# Patient Record
Sex: Female | Born: 1935 | Race: White | Hispanic: No | Marital: Married | Smoking: Former smoker
Health system: Southern US, Community
[De-identification: ages and names within clinical notes are randomized; demographics above are authoritative.]

## PROBLEM LIST (undated history)

## (undated) DIAGNOSIS — G4730 Sleep apnea, unspecified: Secondary | ICD-10-CM

## (undated) DIAGNOSIS — E785 Hyperlipidemia, unspecified: Secondary | ICD-10-CM

## (undated) DIAGNOSIS — I482 Chronic atrial fibrillation, unspecified: Secondary | ICD-10-CM

## (undated) DIAGNOSIS — M7120 Synovial cyst of popliteal space [Baker], unspecified knee: Secondary | ICD-10-CM

## (undated) DIAGNOSIS — M1990 Unspecified osteoarthritis, unspecified site: Secondary | ICD-10-CM

## (undated) DIAGNOSIS — I2510 Atherosclerotic heart disease of native coronary artery without angina pectoris: Secondary | ICD-10-CM

## (undated) DIAGNOSIS — G459 Transient cerebral ischemic attack, unspecified: Secondary | ICD-10-CM

## (undated) DIAGNOSIS — I219 Acute myocardial infarction, unspecified: Secondary | ICD-10-CM

## (undated) DIAGNOSIS — I5022 Chronic systolic (congestive) heart failure: Secondary | ICD-10-CM

## (undated) DIAGNOSIS — I341 Nonrheumatic mitral (valve) prolapse: Secondary | ICD-10-CM

## (undated) HISTORY — DX: Nonrheumatic mitral (valve) prolapse: I34.1

## (undated) HISTORY — PX: CORONARY ANGIOPLASTY: SHX604

## (undated) HISTORY — DX: Hyperlipidemia, unspecified: E78.5

## (undated) HISTORY — PX: OTHER SURGICAL HISTORY: SHX169

## (undated) HISTORY — DX: Atherosclerotic heart disease of native coronary artery without angina pectoris: I25.10

## (undated) HISTORY — PX: TONSILLECTOMY: SUR1361

---

## 1999-04-07 ENCOUNTER — Encounter: Admission: RE | Admit: 1999-04-07 | Discharge: 1999-04-07 | Payer: Self-pay | Admitting: Internal Medicine

## 1999-05-04 ENCOUNTER — Encounter: Admission: RE | Admit: 1999-05-04 | Discharge: 1999-05-04 | Payer: Self-pay | Admitting: Hematology and Oncology

## 1999-09-13 ENCOUNTER — Encounter: Admission: RE | Admit: 1999-09-13 | Discharge: 1999-09-13 | Payer: Self-pay | Admitting: Hematology and Oncology

## 1999-11-09 ENCOUNTER — Encounter: Admission: RE | Admit: 1999-11-09 | Discharge: 1999-11-09 | Payer: Self-pay | Admitting: Internal Medicine

## 1999-11-24 ENCOUNTER — Encounter: Admission: RE | Admit: 1999-11-24 | Discharge: 1999-11-24 | Payer: Self-pay | Admitting: Hematology and Oncology

## 2000-04-11 ENCOUNTER — Encounter: Admission: RE | Admit: 2000-04-11 | Discharge: 2000-04-11 | Payer: Self-pay | Admitting: Internal Medicine

## 2000-04-19 ENCOUNTER — Ambulatory Visit (HOSPITAL_COMMUNITY): Admission: RE | Admit: 2000-04-19 | Discharge: 2000-04-19 | Payer: Self-pay

## 2000-04-19 ENCOUNTER — Encounter: Payer: Self-pay | Admitting: Internal Medicine

## 2002-01-20 ENCOUNTER — Encounter: Admission: RE | Admit: 2002-01-20 | Discharge: 2002-01-20 | Payer: Self-pay | Admitting: Internal Medicine

## 2002-02-03 ENCOUNTER — Encounter: Admission: RE | Admit: 2002-02-03 | Discharge: 2002-02-03 | Payer: Self-pay | Admitting: Internal Medicine

## 2002-02-03 ENCOUNTER — Other Ambulatory Visit: Admission: RE | Admit: 2002-02-03 | Discharge: 2002-02-03 | Payer: Self-pay | Admitting: Family Medicine

## 2003-10-12 ENCOUNTER — Encounter: Admission: RE | Admit: 2003-10-12 | Discharge: 2003-10-12 | Payer: Self-pay | Admitting: Internal Medicine

## 2003-10-15 ENCOUNTER — Ambulatory Visit (HOSPITAL_COMMUNITY): Admission: RE | Admit: 2003-10-15 | Discharge: 2003-10-15 | Payer: Self-pay | Admitting: Internal Medicine

## 2003-10-26 ENCOUNTER — Encounter: Admission: RE | Admit: 2003-10-26 | Discharge: 2003-10-26 | Payer: Self-pay | Admitting: Internal Medicine

## 2003-11-11 ENCOUNTER — Encounter: Admission: RE | Admit: 2003-11-11 | Discharge: 2003-11-11 | Payer: Self-pay | Admitting: Internal Medicine

## 2003-11-18 ENCOUNTER — Encounter: Admission: RE | Admit: 2003-11-18 | Discharge: 2003-11-18 | Payer: Self-pay | Admitting: Internal Medicine

## 2004-08-28 ENCOUNTER — Emergency Department (HOSPITAL_COMMUNITY): Admission: EM | Admit: 2004-08-28 | Discharge: 2004-08-28 | Payer: Self-pay | Admitting: Family Medicine

## 2004-08-30 ENCOUNTER — Emergency Department (HOSPITAL_COMMUNITY): Admission: EM | Admit: 2004-08-30 | Discharge: 2004-08-30 | Payer: Self-pay | Admitting: Family Medicine

## 2004-09-01 ENCOUNTER — Emergency Department (HOSPITAL_COMMUNITY): Admission: EM | Admit: 2004-09-01 | Discharge: 2004-09-01 | Payer: Self-pay | Admitting: Family Medicine

## 2005-04-16 ENCOUNTER — Emergency Department (HOSPITAL_COMMUNITY): Admission: EM | Admit: 2005-04-16 | Discharge: 2005-04-16 | Payer: Self-pay | Admitting: Emergency Medicine

## 2005-04-27 ENCOUNTER — Emergency Department (HOSPITAL_COMMUNITY): Admission: EM | Admit: 2005-04-27 | Discharge: 2005-04-27 | Payer: Self-pay | Admitting: Emergency Medicine

## 2005-05-18 ENCOUNTER — Ambulatory Visit: Payer: Self-pay | Admitting: Internal Medicine

## 2006-05-10 ENCOUNTER — Encounter: Payer: Self-pay | Admitting: Emergency Medicine

## 2006-05-11 ENCOUNTER — Inpatient Hospital Stay (HOSPITAL_COMMUNITY): Admission: EM | Admit: 2006-05-11 | Discharge: 2006-05-14 | Payer: Self-pay | Admitting: Emergency Medicine

## 2006-06-06 ENCOUNTER — Encounter (HOSPITAL_COMMUNITY): Admission: RE | Admit: 2006-06-06 | Discharge: 2006-09-04 | Payer: Self-pay | Admitting: Cardiovascular Disease

## 2006-09-05 ENCOUNTER — Encounter (HOSPITAL_COMMUNITY): Admission: RE | Admit: 2006-09-05 | Discharge: 2006-09-27 | Payer: Self-pay | Admitting: Cardiovascular Disease

## 2007-09-22 ENCOUNTER — Emergency Department (HOSPITAL_COMMUNITY): Admission: EM | Admit: 2007-09-22 | Discharge: 2007-09-22 | Payer: Self-pay | Admitting: Emergency Medicine

## 2007-09-22 ENCOUNTER — Ambulatory Visit: Payer: Self-pay | Admitting: Vascular Surgery

## 2007-09-22 ENCOUNTER — Encounter (INDEPENDENT_AMBULATORY_CARE_PROVIDER_SITE_OTHER): Payer: Self-pay | Admitting: Emergency Medicine

## 2008-05-17 LAB — LIPID PANEL: LDL Cholesterol: 43 mg/dL

## 2008-07-26 ENCOUNTER — Encounter: Admission: RE | Admit: 2008-07-26 | Discharge: 2008-07-26 | Payer: Self-pay | Admitting: Internal Medicine

## 2010-03-08 ENCOUNTER — Ambulatory Visit: Payer: Self-pay | Admitting: Cardiovascular Disease

## 2010-07-28 ENCOUNTER — Encounter: Payer: Self-pay | Admitting: Cardiovascular Disease

## 2010-07-28 DIAGNOSIS — E119 Type 2 diabetes mellitus without complications: Secondary | ICD-10-CM | POA: Insufficient documentation

## 2010-07-28 DIAGNOSIS — E785 Hyperlipidemia, unspecified: Secondary | ICD-10-CM | POA: Insufficient documentation

## 2010-07-31 ENCOUNTER — Encounter: Payer: Self-pay | Admitting: Cardiovascular Disease

## 2010-07-31 DIAGNOSIS — I341 Nonrheumatic mitral (valve) prolapse: Secondary | ICD-10-CM | POA: Insufficient documentation

## 2010-09-15 ENCOUNTER — Ambulatory Visit: Payer: Self-pay | Admitting: Cardiovascular Disease

## 2010-09-21 ENCOUNTER — Ambulatory Visit: Payer: Self-pay | Admitting: Cardiovascular Disease

## 2010-10-30 ENCOUNTER — Encounter: Payer: Self-pay | Admitting: Cardiovascular Disease

## 2010-11-07 ENCOUNTER — Telehealth: Payer: Self-pay | Admitting: *Deleted

## 2010-11-07 NOTE — Telephone Encounter (Signed)
Letter sent for missed visit from 09/21/10. Pt cancelled and rescheduled then no showed.Corwin Levins RN

## 2010-11-17 NOTE — Cardiovascular Report (Signed)
NAMEMARDI, EVAVOLD NO.:  [id number]   MEDICAL RECORD NO.:  [id number]          PATIENT TYPE:  INP   LOCATION:  6532                         FACILITY:  [facility]   PHYSICIAN:  Thayer Headings, M.D. DATE OF BIRTH:  1932-06-24   DATE OF PROCEDURE:  05/13/2006  DATE OF DISCHARGE:                              CARDIAC CATHETERIZATION   PROCEDURE:  PTCA and stent.   Shannon [name] is a 77 year old female with a history of atypical chest  pain.  She has had a negative stress Cardiolite study approximately three  years ago.  She presented to the hospital this week complaining of severe  chest pain.  She ruled in for myocardial infarction.  She was referred for  heart catheterization for further evaluation.   PROCEDURE IN DETAIL:  Left catheterization with coronary angiography.   The right femoral artery was easily cannulated using a modified Seldinger  technique.   Hemodynamics:  LV pressure is 104/12 with an aortic pressure of 105/62.   Angiography of left main:  Left main is fairly smooth and normal.   The left anterior descending artery has mild irregularities proximal.   The mid segment has a 40% stenosis right at the origin of the first diagonal  artery.  This lesion also extends into the first diagonal artery creating a  40% stenosis there as well.  The remainder of the mid and distal LAD have  only minor irregularities.  The first diagonal artery is a moderate-sized  vessel with minor plaque.   The left circumflex artery has minor irregularities.  It gives off a large  obtuse marginal branch and then continues around in the AV groove.  The  continuation branch of the circumflex vessel is fairly normal.  It is  codominant.  It gives off a very small posterior descending artery with no  stenoses.   The first obtuse marginal artery is a fairly large branch.  It has an  ulcerated plaque with about a 95% stenosis.  There was TIMI grade 2 flow  down that  first obtuse marginal artery.   The right coronary artery is small and is codominant.  It gives off a very  small posterior descending artery.  One of the right ventricular marginal  branches also continues around and supplies a small amount of the posterior  descending artery distribution.   The left ventriculogram was performed in a 30 RAO position.  It reveals  lateral akinesis.  The ejection fraction is approximately 40%.  There is a  trace amount of mitral regurgitation.  There was no aortic stenosis on  pressure monitoring.   PCI:  A 6-French Judkins left 4 guide was positioned in the left main and an  Asahi soft wire was positioned down the first obtuse marginal artery without  any problems.  A 3.0 x 15 mm Quantum Monorail was positioned across the OM  stenosis.  It was inflated up to 10 atmospheres for 32 seconds.  This was  followed by a 3.0 x 20 mm Taxus stent.  It was deployed at 12 atmospheres  for 24 seconds.  This resulted in a  significantly improved lumen.   Post stent dilatation was achieved using a 3.25 x 15 mm Quantum Monorail.  It was positioned in the distal aspect of the stent and was inflated up to  12 atmospheres for 12 seconds.  It was then positioned in the middle of the  stent and was inflated up to 14 atmospheres for 15 seconds.  It was then  pulled back to the proximal edge and the stent was inflated up to 12  atmospheres for 14 seconds.   This resulted in a very nice lumen.  There was TIMI grade 3 flow down the  stent.   COMPLICATIONS:  None.   CONCLUSIONS:  Successful PTC and stenting of the first obtuse marginal  artery.   She has moderate disease in her left anterior descending artery.  Will  continue medical therapy.  We have given her 300 mg of Plavix in the cath  lab.      [name], P.A.    ______________________________  Thayer Headings, M.D.    RJD/MEDQ  D:  05/13/2006  T:  05/14/2006  Job:  [id number]   cc:   Haywood Pao, M.D.

## 2010-11-17 NOTE — Discharge Summary (Signed)
[name], [name] NO.:  [id number]   MEDICAL RECORD NO.:  [id number]          PATIENT TYPE:  INP   LOCATION:  6532                         FACILITY:  [facility]   PHYSICIAN:  Thayer Headings, M.D. DATE OF BIRTH:  11-12-1931   DATE OF ADMISSION:  05/14/2006  DATE OF DISCHARGE:  05/14/2006                                 DISCHARGE SUMMARY   DISCHARGE DIAGNOSES:  1. Acute lateral wall myocardial infarction.  2. Status post percutaneous coronary intervention and stenting of the      first obtuse marginal artery.  3. Hyperlipidemia.  4. Glucose intolerance.  5. History of palpitations.   DISCHARGE MEDICATIONS:  1. Crestor 10 mg a day.  2. Plavix 75 mg a day.  3. Aspirin 81 mg a day.  4. Protonix or Prilosec as needed.  5. Toprol-XL 50 mg a day.  6. Nitroglycerin 0.4 mg sublingually as needed.   DISPOSITION:  The patient will see Dr. Acie Fredrickson in 1-2 weeks for an EKG.  She  will see Dr. Osborne Casco as needed.   HISTORY:  Mrs. Shannon [name] is a 77 year old female with a history of chest pains  in the past.  She was admitted for acute chest pain and EKG changes  consistent with a lateral wall myocardial infarction.  Please see dictated  H&P for further details.   HOSPITAL COURSE:  1. Coronary artery disease.  The patient was admitted with lateral T-wave      inversions consistent with a subendocardial myocardial infarction .      She was placed on heparin and Integrilin and became pain-free.  She      ruled-in for myocardial infarction with CK-MBs and troponin levels.      Her troponin level was 12.9 at its max.   She underwent heart catheterization which revealed a subtotal first obtuse  marginal artery.  She had mild irregularities in the other vessels.  She  underwent successful PTCA and stenting of the circumflex marginal vessel  using a 3.0 x 20 mm Taxus-Stent deployed at 12 atmospheric.  We post dilated  the stent using a 3.25 mm balloon inflated up to 14  atmospheres.  She  tolerated the procedure quite well and did not have any further episodes of  chest pain.  She did not have any bleeding.   She has now been able to ambulate and is ready for discharge.  She will  return to see Dr. Acie Fredrickson in 1-2 weeks for a followup visit.   1. Hyperlipidemia.  The patient had a fasting lipid profile.  Her LDL was      found to be 132.  Her HDL was found to be 49, triglycerides  59, and      total cholesterol was 193.  We will start her on Crestor 10 mg a day.      All of her other medical problems are stable.           ______________________________  Thayer Headings, M.D.     PJN/MEDQ  D:  05/14/2006  T:  05/14/2006  Job:  [id number]   cc:   Fransico Him.  Tisovec, M.D.

## 2010-11-17 NOTE — H&P (Signed)
NAMETU, FERENCE NO.:  [id number]   MEDICAL RECORD NO.:  [id number]          PATIENT TYPE:  INP   LOCATION:  2311                         FACILITY:  [facility]   PHYSICIAN:  Darlin Coco, M.D. DATE OF BIRTH:  19-Jan-1932   DATE OF ADMISSION:  05/11/2006  DATE OF DISCHARGE:                                HISTORY & PHYSICAL   CHIEF COMPLAINT:  Chest pain.   HISTORY:  This is a 77 year old married, Caucasian female who lives in  [place] and who was in [place] last evening to drive her granddaughter  to a football game.  When she picked the granddaughter up at the end of the  game and was driving her home, she began experiencing severe substernal  chest pain, radiating through to the back, associated with nausea and  vomiting x2.  She felt like she was having heart attack.  She instructed the  family to call 9-1-1 and was taken to [facility].  In the emergency room at [facility], her EKG showed some transient  ST-segment depression in the anterior leads.  Her first two sets of enzymes  were negative, and third point-of-care set was mildly positive.  She was  started on aspirin, IV nitroglycerin, and IV heparin in the [facility] emergency room and transferred.  Of note, is the fact that she does  not have any known coronary disease.  She had been told years ago that she  had a problem with her mitral valve.  She has seen Dr. Acie Fredrickson.  Apparently,  her echocardiogram did not show any serious problem with the mitral valve  and she states that she also had a normal treadmill Cardiolite approximately  3 years ago at Dr. Elmarie Shiley office.   Her only regular medicine is a generic Toprol XL 50 mg which she takes for  prevention of palpitations.   She sees Dr. Osborne Casco for internal medicine care.   She has been told that she is pre-diabetic and on her own she has lost 19  pounds intentionally since July, in an effort to prevent  full-blown  diabetes.   ALLERGIES:  SHE IS ALLERGIC TO:  1. DEMEROL.  2. PENICILLIN.  3. POSSIBLY CODEINE.   FAMILY HISTORY:  Reveals that her mother died at 34 of an acute MI which led  to congestive heart failure.  Her father died at 42 of sudden cardiac death  and acute indigestion.  She has a 7 year old sister who is living and  well and does not see doctors often.  And, she has an 2 year old sister who  has had multiple operations including coronary artery bypass graft surgery  and is living.   SOCIAL HISTORY:  Reveals that she has been married twice.  She had four  children by her first marriage and one by her second marriage.  She has been  married to her present husband 4 years.  She smoked briefly in the 1960s,  but not since.  She does not drink alcohol.  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   SURGERY:  Tonsillectomy.   REVIEW OF SYSTEMS:  She has had a remote history of ulcers which never bled  42 years ago.  GENITOURINARY:  She has some mild stress incontinence when  she coughs or sneezes.  She does not have any dysuria.  She denies cough or  sputum production.  Remainder of review of systems is negative in detail.   PHYSICAL EXAMINATION:  VITAL SIGNS:  Blood pressure is 125/75, pulse 66  regular, respirations are normal.  GENERAL:  Color, she is very pale.  She is not diaphoretic.  Despite the  pallor, her hemoglobin at [facility] was normal.  NECK:  Jugular venous pressure is normal.  The carotids are normal without  bruits.  CHEST:  Clear.  HEART:  Reveals a quiet precordium without murmur, gallop, rub or click.  ABDOMEN:  Soft without hepatosplenomegaly or masses.  Bowel sounds are  present.  EXTREMITIES:  Show no phlebitis or edema and she has excellent pulses.   Her chest x-ray shows normal heart size and no active disease.   Her  electrocardiogram showed normal sinus rhythm, left axis deviation, and on  the EKG on arrival here she does not have any ischemic ST-T wave changes.   IMPRESSION:  1. Chest pain, presently relieved, suggestive of acute coronary syndrome      with mildly elevated enzymes.  2. History of pre-diabetes.  3. History of hypercholesterolemia.   DISPOSITION:  1. We are going to admit to the CCU or step-down.  2. We will continue IV nitroglycerin and IV heparin.  3. We will add Integrilin.  4. We will add Lipitor and continue beta blocker and aspirin.  5. Anticipate probable cardiac catheterization Monday by Dr. Acie Fredrickson or      sooner if she has recurrent pain.           ______________________________  Darlin Coco, M.D.     TB/MEDQ  D:  05/11/2006  T:  05/11/2006  Job:  [id number]   cc:   Thayer Headings, M.D.  Haywood Pao, M.D.

## 2010-12-07 ENCOUNTER — Ambulatory Visit (INDEPENDENT_AMBULATORY_CARE_PROVIDER_SITE_OTHER): Payer: Medicare PPO | Admitting: Cardiovascular Disease

## 2010-12-07 ENCOUNTER — Encounter: Payer: Self-pay | Admitting: Cardiovascular Disease

## 2010-12-07 VITALS — BP 106/62 | HR 74 | Ht 63.0 in | Wt 158.8 lb

## 2010-12-07 DIAGNOSIS — I2510 Atherosclerotic heart disease of native coronary artery without angina pectoris: Secondary | ICD-10-CM

## 2010-12-07 NOTE — Progress Notes (Signed)
Shannon [name] Date of Birth  12-13-1931 [facility] [id number] [place], [place]  [address] [phone]  Fax  [phone]  History of Present Illness:  No angina.  Stays busy throughout the day.  She has occasional episodes of very atypical chest pain but nothing that feels like her standard episodes of angina.  Current Outpatient Prescriptions on File Prior to Visit  Medication Sig Dispense Refill  . aspirin 81 MG tablet Take 81 mg by mouth daily.        . fish oil-omega-3 fatty acids 1000 MG capsule Take 2 g by mouth daily.        [name] HYDROCODONE-ACETAMINOPHEN PO Take by mouth as needed.        . metFORMIN (GLUMETZA) 500 MG (MOD) 24 hr tablet Take 500 mg by mouth daily.        . metoprolol (TOPROL-XL) 50 MG 24 hr tablet Take 50 mg by mouth daily.        . nitroGLYCERIN (NITROSTAT) 0.4 MG SL tablet Place 0.4 mg under the tongue every 5 (five) minutes as needed.        [name] VITAMIN D, CHOLECALCIFEROL, PO Take 1,600 Units by mouth.        . DISCONTD: calcium-vitamin D 185 MG TABS Take 185 mg by mouth daily.        [name] DISCONTD: clopidogrel (PLAVIX) 75 MG tablet Take 75 mg by mouth daily.        [name] DISCONTD: magnesium gluconate (MAGONATE) 500 MG tablet Take 500 mg by mouth 2 (two) times daily.        [name] DISCONTD: Zinc 10 MG TABS Take by mouth.          Allergies  Allergen Reactions  . Demerol   . Penicillins     Past Medical History  Diagnosis Date  . CAD (coronary artery disease)   . Hyperlipidemia   . Diabetes mellitus   . MVP (mitral valve prolapse)     Past Surgical History  Procedure Date  . Tonsillectomy   . Coronary angioplasty     History  Smoking status  . Former Smoker -- 1.0 packs/day  . Types: Cigarettes  Smokeless tobacco  . Not on file    History  Alcohol Use No    Family History  Problem Relation Age of Onset  . Heart attack Father   . Heart attack Mother   . Heart attack Brother   .  Coronary artery disease Sister     CABG  . Alzheimer's disease Sister     X3    Reviw of Systems:  Reviewed in the HPI.  All other systems are negative.  Physical Exam: BP 106/62  Pulse 74  Ht 5\' 3"  (1.6 m)  Wt 158 lb 12.8 oz (72.031 kg)  BMI 28.13 kg/m2 The patient is alert and oriented x 3.  The mood and affect are normal.  The skin is warm and dry.  Color is normal.  The HEENT exam reveals that the sclera are nonicteric.  The mucous membranes are moist.  The carotids are 2+ without bruits.  There is no thyromegaly.  There is no JVD.  The lungs are clear.  The chest wall is non tender.  The heart exam reveals a regular rate with a normal S1 and S2.  There are no murmurs, gallops, or rubs.  The PMI is not displaced.   Abdominal exam reveals good bowel  sounds.  There is no guarding or rebound.  There is no hepatosplenomegaly or tenderness.  There are no masses.  Exam of the legs reveal no clubbing, cyanosis, or edema.  The legs are without rashes.  The distal pulses are intact.  Cranial nerves II - XII are intact.  Motor and sensory functions are intact.  The gait is normal.  Assessment / Plan:

## 2010-12-07 NOTE — Assessment & Plan Note (Signed)
She's doing very well from a cardiac standpoint. She's not having any episodes of angina. She stopped her Plavix in December because of cost.   She'll continue with her current medications. I'll see her again in 6 months.

## 2011-02-14 ENCOUNTER — Emergency Department (HOSPITAL_COMMUNITY)
Admission: EM | Admit: 2011-02-14 | Discharge: 2011-02-15 | Disposition: A | Discharge status: Home / Self Care | Payer: Medicare PPO | Attending: Emergency Medicine | Admitting: Emergency Medicine

## 2011-02-14 DIAGNOSIS — E119 Type 2 diabetes mellitus without complications: Secondary | ICD-10-CM | POA: Insufficient documentation

## 2011-02-14 DIAGNOSIS — Y92009 Unspecified place in unspecified non-institutional (private) residence as the place of occurrence of the external cause: Secondary | ICD-10-CM | POA: Insufficient documentation

## 2011-02-14 DIAGNOSIS — S0083XA Contusion of other part of head, initial encounter: Secondary | ICD-10-CM | POA: Insufficient documentation

## 2011-02-14 DIAGNOSIS — I252 Old myocardial infarction: Secondary | ICD-10-CM | POA: Insufficient documentation

## 2011-02-14 DIAGNOSIS — Z7982 Long term (current) use of aspirin: Secondary | ICD-10-CM | POA: Insufficient documentation

## 2011-02-14 DIAGNOSIS — Z8673 Personal history of transient ischemic attack (TIA), and cerebral infarction without residual deficits: Secondary | ICD-10-CM | POA: Insufficient documentation

## 2011-02-14 DIAGNOSIS — S0003XA Contusion of scalp, initial encounter: Secondary | ICD-10-CM | POA: Insufficient documentation

## 2011-02-14 DIAGNOSIS — R51 Headache: Secondary | ICD-10-CM | POA: Insufficient documentation

## 2011-02-14 DIAGNOSIS — Z79899 Other long term (current) drug therapy: Secondary | ICD-10-CM | POA: Insufficient documentation

## 2011-02-14 DIAGNOSIS — W1809XA Striking against other object with subsequent fall, initial encounter: Secondary | ICD-10-CM | POA: Insufficient documentation

## 2011-02-14 DIAGNOSIS — S0990XA Unspecified injury of head, initial encounter: Secondary | ICD-10-CM | POA: Insufficient documentation

## 2011-02-15 ENCOUNTER — Emergency Department (HOSPITAL_COMMUNITY): Payer: Medicare PPO

## 2011-02-15 LAB — GLUCOSE, CAPILLARY: Glucose-Capillary: 167 mg/dL — ABNORMAL HIGH (ref 70–99)

## 2011-03-26 LAB — URINALYSIS, ROUTINE W REFLEX MICROSCOPIC
Glucose, UA: NEGATIVE
Hgb urine dipstick: NEGATIVE
Protein, ur: NEGATIVE
Specific Gravity, Urine: 1.014
pH: 5

## 2011-03-26 LAB — POCT I-STAT, CHEM 8
BUN: 16
Calcium, Ion: 1.15
Hemoglobin: 12.6
Sodium: 141
TCO2: 22

## 2011-03-26 LAB — CK: Total CK: 132

## 2011-03-26 LAB — URINE MICROSCOPIC-ADD ON

## 2011-04-14 ENCOUNTER — Other Ambulatory Visit: Payer: Self-pay | Admitting: Cardiovascular Disease

## 2011-04-16 ENCOUNTER — Other Ambulatory Visit: Payer: Self-pay | Admitting: *Deleted

## 2011-04-16 MED ORDER — METOPROLOL SUCCINATE ER 50 MG PO TB24: 50.0000 mg | ORAL_TABLET | Freq: Every day | ORAL | Status: DC

## 2011-04-16 NOTE — Telephone Encounter (Signed)
Fax Received. Refill Completed. Landree Fernholz Chowoe (M.A)  

## 2011-06-05 ENCOUNTER — Encounter: Payer: Self-pay | Admitting: Cardiovascular Disease

## 2011-06-05 ENCOUNTER — Ambulatory Visit (INDEPENDENT_AMBULATORY_CARE_PROVIDER_SITE_OTHER): Payer: Medicare PPO | Admitting: Cardiovascular Disease

## 2011-06-05 VITALS — BP 123/70 | HR 70 | Ht 63.0 in | Wt 155.8 lb

## 2011-06-05 DIAGNOSIS — I2510 Atherosclerotic heart disease of native coronary artery without angina pectoris: Secondary | ICD-10-CM

## 2011-06-05 MED ORDER — NITROGLYCERIN 0.4 MG SL SUBL: 0.4000 mg | SUBLINGUAL_TABLET | SUBLINGUAL | Status: DC | PRN

## 2011-06-05 NOTE — Patient Instructions (Signed)
Your physician wants you to follow-up in: 6 months You will receive a reminder letter in the mail two months in advance. If you don't receive a letter, please call our office to schedule the follow-up appointment  Your physician recommends that you return for a FASTING lipid profile: 6 months

## 2011-06-05 NOTE — Assessment & Plan Note (Signed)
Shannon [name] is doing well from a cardiac standpoint.  She has some occasional episodes of chest pain early in the AM but this is also associated with a headache.  She has been evaluated for sleep apnea.  She doesn't have angina with exercise.  I've asked her to call for any worsening of her CP.

## 2011-06-05 NOTE — Progress Notes (Signed)
    Shannon [name] Date of Birth  [birth year] [facility] [id number]    [place], [place]  [address] [phone]  Fax  [phone]  History of Present Illness:  Shannon [name] is an 77 y.o. female with a hx of CAD.  She has some occasional episodes of chest pressure typically when she wakes up in the morning. She also wakes up with a severe headache. She's been worked up for sleep apnea.  She works out in the yard on a fairly regular basis and does not have any chest pain or chest tightness with exercise the  Current Outpatient Prescriptions on File Prior to Visit  Medication Sig Dispense Refill  . aspirin 81 MG tablet Take 81 mg by mouth daily.        . fish oil-omega-3 fatty acids 1000 MG capsule Take 2 g by mouth daily.        [name] HYDROCODONE-ACETAMINOPHEN PO Take by mouth as needed.        . metFORMIN (GLUMETZA) 500 MG (MOD) 24 hr tablet Take 500 mg by mouth daily.        . metoprolol (TOPROL-XL) 50 MG 24 hr tablet Take 1 tablet (50 mg total) by mouth daily.  90 tablet  1  . nitroGLYCERIN (NITROSTAT) 0.4 MG SL tablet Place 0.4 mg under the tongue every 5 (five) minutes as needed.        [name] VITAMIN D, CHOLECALCIFEROL, PO Take 1,600 Units by mouth.          Allergies  Allergen Reactions  . Demerol   . Penicillins     Past Medical History  Diagnosis Date  . CAD (coronary artery disease)   . Hyperlipidemia   . Diabetes mellitus   . MVP (mitral valve prolapse)     Past Surgical History  Procedure Date  . Tonsillectomy   . Coronary angioplasty     History  Smoking status  . Former Smoker -- 1.0 packs/day  . Types: Cigarettes  Smokeless tobacco  . Not on file    History  Alcohol Use No    Family History  Problem Relation Age of Onset  . Heart attack Father   . Heart attack Mother   . Heart attack Brother   . Coronary artery disease Sister     CABG  . Alzheimer's disease Sister     X3    Reviw of Systems:  Reviewed in the HPI.  All other  systems are negative.  Physical Exam: BP 123/70  Pulse 70  Ht 5\' 3"  (1.6 m)  Wt 155 lb 12.8 oz (70.67 kg)  BMI 27.60 kg/m2 The patient is alert and oriented x 3.  The mood and affect are normal.   Skin: warm and dry.  Color is normal.    HEENT:   Her carotids are normal. The neck is supple. Her mucous membranes are moist.  Lungs: Lungs are clear.   Heart: Regular rate S1-S2.    Abdomen: Her abdomen is benign features good bowel sounds.  Extremities:  No clubbing cyanosis or edema  Neuro:  Exam is nonfocal. Gait is normal    ECG: Normal sinus rhythm. Normal EKG.  Assessment / Plan:

## 2011-07-12 ENCOUNTER — Telehealth: Payer: Self-pay | Admitting: Cardiovascular Disease

## 2011-07-12 NOTE — Telephone Encounter (Signed)
Pt took one nitro at 230 am this morning, was told to call if had to take and nitro to call and let us know, feels better now, chest pain gone, just feels washed out

## 2011-07-12 NOTE — Telephone Encounter (Signed)
Pt just calling fyi to let Dr Acie Fredrickson know that she took one nitro tab last night.  The pain was relieved by the nitro.  She also states she was called by the sleep center to let her know that she has sleep apnea and wakes up 68 times per minute.  She will be seeing them again for treatment a week from tomorrow.

## 2011-07-13 NOTE — Telephone Encounter (Signed)
Spoke with husband/ denies further cp, she is driving grand daughter to school, i will try to reach later.

## 2011-07-13 NOTE — Telephone Encounter (Signed)
If she continues to have angina, we should get a [employer].  Please schedule her for an OV in a week or so if she continues to have angina.

## 2011-07-13 NOTE — Telephone Encounter (Signed)
No chest pain, will f/u as needed, she is f/u with sleep lab for apnea.

## 2011-10-15 ENCOUNTER — Other Ambulatory Visit: Payer: Self-pay | Admitting: Cardiovascular Disease

## 2012-01-07 ENCOUNTER — Encounter: Payer: Self-pay | Admitting: Cardiovascular Disease

## 2012-01-07 ENCOUNTER — Ambulatory Visit (INDEPENDENT_AMBULATORY_CARE_PROVIDER_SITE_OTHER): Payer: Medicare PPO | Admitting: Cardiovascular Disease

## 2012-01-07 VITALS — BP 118/74 | HR 63 | Ht 63.0 in | Wt 155.0 lb

## 2012-01-07 DIAGNOSIS — E785 Hyperlipidemia, unspecified: Secondary | ICD-10-CM

## 2012-01-07 DIAGNOSIS — I2510 Atherosclerotic heart disease of native coronary artery without angina pectoris: Secondary | ICD-10-CM

## 2012-01-07 MED ORDER — ATORVASTATIN CALCIUM 20 MG PO TABS: 20.0000 mg | ORAL_TABLET | Freq: Every day | ORAL | Status: DC

## 2012-01-07 NOTE — Assessment & Plan Note (Signed)
She has not had any angina.  Her LDL cholesterol is 121.  She has been on crestor in the past but stopped it because of cost.  We will start her on Atorvastatin 20.  We will check fasting lipid profile, hepatic profile, and basic metabolic profile in 3 months. I'll see her again in 6 months for repeat fasting labs and an EKG.

## 2012-01-07 NOTE — Patient Instructions (Signed)
Your physician wants you to follow-up in: 6 months  You will receive a reminder letter in the mail two months in advance. If you don't receive a letter, please call our office to schedule the follow-up appointment.  Your physician recommends that you return for a FASTING lipid profile: 3 months and 6 month  Your physician has recommended you make the following change in your medication:   Start atorvastatin/ generic for lipitor, 20 mg in evening.

## 2012-01-07 NOTE — Progress Notes (Signed)
Shannon [name] Date of Birth  11-01-1931       [facility]    [employer] [address], [address], [place], [place]  [address]   [place], [place]  [address] [phone]     [phone]   Fax  [phone]    Fax [phone]  Problem List: 1. Coronary artery disease: Status post PTCA and stenting of the first obtuse marginal-05/13/2006. We placed a 3.0 x 20 mm Taxus stent. It was post dilated using a 3.25 mm noncompliant balloon up to 14 atmospheres 2. Diabetes mellitus 3. Hyperlipidemia  History of Present Illness: Shannon [name] is an 78 y.o. female with a hx of CAD. She has some occasional episodes of chest pressure typically when she wakes up in the morning. She also wakes up with a severe headache. She's been worked up for sleep apnea. She works out in the yard on a fairly regular basis and does not have any chest pain or chest tightness with exercise.    She is active all day long.  She gets tired but never has any chest pain.    Current Outpatient Prescriptions on File Prior to Visit  Medication Sig Dispense Refill  . aspirin 81 MG tablet Take 81 mg by mouth daily.        . fish oil-omega-3 fatty acids 1000 MG capsule Take 1 g by mouth daily.       [name] HYDROCODONE-ACETAMINOPHEN PO Take by mouth as needed.        . metFORMIN (GLUMETZA) 500 MG (MOD) 24 hr tablet Take 500 mg by mouth daily.        . metoprolol succinate (TOPROL-XL) 50 MG 24 hr tablet TAKE ONE TABLET BY MOUTH ONE TIME DAILY  90 tablet  1  . nitroGLYCERIN (NITROSTAT) 0.4 MG SL tablet Place 1 tablet (0.4 mg total) under the tongue every 5 (five) minutes as needed. Up to 3 doses, if pain continues, call 911.  25 tablet  4  . VITAMIN D, CHOLECALCIFEROL, PO Take 1,600 Units by mouth.          Allergies  Allergen Reactions  . Demerol   . Penicillins     Past Medical History  Diagnosis Date  . CAD (coronary artery disease)   . Hyperlipidemia   . Diabetes mellitus   .  MVP (mitral valve prolapse)     Past Surgical History  Procedure Date  . Tonsillectomy   . Coronary angioplasty     Status post PTCA and stenting of the first obtuse marginal-05/13/2006. We placed a 3.0 x 20 mm Taxus stent. It was post dilated using a 3.25 mm noncompliant balloon up to 14 atmospheres    History  Smoking status  . Former Smoker -- 1.0 packs/day  . Types: Cigarettes  Smokeless tobacco  . Not on file    History  Alcohol Use No    Family History  Problem Relation Age of Onset  . Heart attack Father   . Heart attack Mother   . Heart attack Brother   . Coronary artery disease Sister     CABG  . Alzheimer's disease Sister     X3    Reviw of Systems:  Reviewed in the HPI.  All other systems are negative.  Physical Exam: Blood pressure 118/74, pulse 63, height 5\' 3"  (1.6 m), weight 155 lb (70.308 kg). General: Well developed, well nourished, in no acute distress.  Head: Normocephalic, atraumatic, sclera non-icteric, mucus  membranes are moist,   Neck: Supple. Carotids are 2 + without bruits. No JVD  Lungs: Clear bilaterally to auscultation.  Heart: regular rate.  normal  S1 S2. No murmurs, gallops or rubs.  Abdomen: Soft, non-tender, non-distended with normal bowel sounds. No hepatomegaly. No rebound/guarding. No masses.  Msk:  Strength and tone are normal  Extremities: No clubbing or cyanosis. No edema.  Distal pedal pulses are 2+ and equal bilaterally.  Neuro: Alert and oriented X 3. Moves all extremities spontaneously.  Psych:  Responds to questions appropriately with a normal affect.  ECG:  Lab recent labs from her medical doctor's office reveals a normal basic metabolic profile. Her CBC is normal. Her total cholesterol is 221. The triglyceride level is to 36. Her HDL is 53. LDL is 121.  Assessment / Plan:

## 2012-01-09 ENCOUNTER — Encounter: Payer: Self-pay | Admitting: Cardiovascular Disease

## 2012-04-08 ENCOUNTER — Other Ambulatory Visit (INDEPENDENT_AMBULATORY_CARE_PROVIDER_SITE_OTHER): Payer: Medicare PPO

## 2012-04-08 DIAGNOSIS — I2510 Atherosclerotic heart disease of native coronary artery without angina pectoris: Secondary | ICD-10-CM

## 2012-04-08 DIAGNOSIS — E785 Hyperlipidemia, unspecified: Secondary | ICD-10-CM

## 2012-04-08 LAB — BASIC METABOLIC PANEL
Calcium: 8.9 mg/dL (ref 8.4–10.5)
GFR: 76.96 mL/min (ref >60.00)
Glucose, Bld: 125 mg/dL — ABNORMAL HIGH (ref 70–99)
Potassium: 3.8 mEq/L (ref 3.5–5.1)
Sodium: 138 mEq/L (ref 135–145)

## 2012-04-08 LAB — HEPATIC FUNCTION PANEL
ALT: 14 U/L (ref 0–35)
AST: 22 U/L (ref 0–37)
Bilirubin, Direct: 0.1 mg/dL (ref 0.0–0.3)
Total Bilirubin: 0.7 mg/dL (ref 0.3–1.2)
Total Protein: 7.1 g/dL (ref 6.0–8.3)

## 2012-04-08 LAB — LIPID PANEL
Cholesterol: 118 mg/dL (ref 0–200)
HDL: 55.5 mg/dL (ref >39.00)

## 2012-04-16 ENCOUNTER — Other Ambulatory Visit: Payer: Self-pay | Admitting: Cardiovascular Disease

## 2012-04-17 NOTE — Telephone Encounter (Signed)
Fax Received. Refill Completed. Delbert Darley Chowoe (R.M.A)   

## 2012-10-07 ENCOUNTER — Emergency Department (HOSPITAL_COMMUNITY): Payer: Medicare PPO

## 2012-10-07 ENCOUNTER — Inpatient Hospital Stay (HOSPITAL_COMMUNITY)
Admission: EM | Admit: 2012-10-07 | Discharge: 2012-10-09 | DRG: 251 | Disposition: A | Discharge status: Home / Self Care | Payer: Medicare PPO | Attending: Emergency Medicine | Admitting: Emergency Medicine

## 2012-10-07 ENCOUNTER — Encounter (HOSPITAL_COMMUNITY): Payer: Self-pay | Admitting: Emergency Medicine

## 2012-10-07 DIAGNOSIS — I252 Old myocardial infarction: Secondary | ICD-10-CM

## 2012-10-07 DIAGNOSIS — I2510 Atherosclerotic heart disease of native coronary artery without angina pectoris: Secondary | ICD-10-CM | POA: Diagnosis present

## 2012-10-07 DIAGNOSIS — Z87891 Personal history of nicotine dependence: Secondary | ICD-10-CM

## 2012-10-07 DIAGNOSIS — I10 Essential (primary) hypertension: Secondary | ICD-10-CM | POA: Diagnosis present

## 2012-10-07 DIAGNOSIS — Z9861 Coronary angioplasty status: Secondary | ICD-10-CM

## 2012-10-07 DIAGNOSIS — E785 Hyperlipidemia, unspecified: Secondary | ICD-10-CM | POA: Diagnosis present

## 2012-10-07 DIAGNOSIS — N390 Urinary tract infection, site not specified: Secondary | ICD-10-CM | POA: Diagnosis present

## 2012-10-07 DIAGNOSIS — R7989 Other specified abnormal findings of blood chemistry: Secondary | ICD-10-CM

## 2012-10-07 DIAGNOSIS — Z79899 Other long term (current) drug therapy: Secondary | ICD-10-CM

## 2012-10-07 DIAGNOSIS — Z7982 Long term (current) use of aspirin: Secondary | ICD-10-CM

## 2012-10-07 DIAGNOSIS — E119 Type 2 diabetes mellitus without complications: Secondary | ICD-10-CM | POA: Diagnosis present

## 2012-10-07 DIAGNOSIS — R300 Dysuria: Secondary | ICD-10-CM

## 2012-10-07 DIAGNOSIS — I341 Nonrheumatic mitral (valve) prolapse: Secondary | ICD-10-CM | POA: Diagnosis present

## 2012-10-07 DIAGNOSIS — I4892 Unspecified atrial flutter: Principal | ICD-10-CM

## 2012-10-07 DIAGNOSIS — I059 Rheumatic mitral valve disease, unspecified: Secondary | ICD-10-CM | POA: Diagnosis present

## 2012-10-07 HISTORY — DX: Acute myocardial infarction, unspecified: I21.9

## 2012-10-07 HISTORY — DX: Sleep apnea, unspecified: G47.30

## 2012-10-07 HISTORY — DX: Transient cerebral ischemic attack, unspecified: G45.9

## 2012-10-07 HISTORY — DX: Unspecified osteoarthritis, unspecified site: M19.90

## 2012-10-07 LAB — MAGNESIUM: Magnesium: 2.1 mg/dL (ref 1.5–2.5)

## 2012-10-07 LAB — POCT I-STAT TROPONIN I: Troponin i, poc: 0.02 ng/mL (ref 0.00–0.08)

## 2012-10-07 LAB — COMPREHENSIVE METABOLIC PANEL
AST: 18 U/L (ref 0–37)
Albumin: 3.2 g/dL — ABNORMAL LOW (ref 3.5–5.2)
BUN: 10 mg/dL (ref 6–23)
Calcium: 9 mg/dL (ref 8.4–10.5)
Creatinine, Ser: 0.78 mg/dL (ref 0.50–1.10)
Total Protein: 6.4 g/dL (ref 6.0–8.3)

## 2012-10-07 LAB — PROTIME-INR
INR: 1.04 (ref 0.00–1.49)
Prothrombin Time: 13.5 seconds (ref 11.6–15.2)

## 2012-10-07 LAB — CBC
HCT: 33.3 % — ABNORMAL LOW (ref 36.0–46.0)
MCHC: 35.7 g/dL (ref 30.0–36.0)
MCV: 91 fL (ref 78.0–100.0)
Platelets: 176 10*3/uL (ref 150–400)
RDW: 13.8 % (ref 11.5–15.5)

## 2012-10-07 LAB — GLUCOSE, CAPILLARY

## 2012-10-07 LAB — TROPONIN I: Troponin I: <0.3 ng/mL (ref <0.30)

## 2012-10-07 LAB — PRO B NATRIURETIC PEPTIDE: Pro B Natriuretic peptide (BNP): 4302 pg/mL — ABNORMAL HIGH (ref 0–450)

## 2012-10-07 MED ORDER — ZOLPIDEM TARTRATE 5 MG PO TABS: 5.0000 mg | ORAL_TABLET | Freq: Every evening | ORAL | Status: DC | PRN

## 2012-10-07 MED ORDER — DILTIAZEM HCL 100 MG IV SOLR
5.0000 mg/h | INTRAVENOUS | Status: DC
  Administered 2012-10-07: 5 mg/h via INTRAVENOUS
  Administered 2012-10-07 – 2012-10-08 (×3): 10 mg/h via INTRAVENOUS
  Administered 2012-10-09: 5 mg/h via INTRAVENOUS
  Filled 2012-10-07 (×5): qty 100

## 2012-10-07 MED ORDER — OMEGA-3-ACID ETHYL ESTERS 1 G PO CAPS
1.0000 g | ORAL_CAPSULE | Freq: Every day | ORAL | Status: DC
  Administered 2012-10-07 – 2012-10-09 (×3): 1 g via ORAL
  Filled 2012-10-07 (×3): qty 1

## 2012-10-07 MED ORDER — HEPARIN BOLUS VIA INFUSION
4000.0000 [IU] | Freq: Once | INTRAVENOUS | Status: AC
  Administered 2012-10-07: 4000 [IU] via INTRAVENOUS

## 2012-10-07 MED ORDER — FUROSEMIDE 10 MG/ML IJ SOLN
20.0000 mg | Freq: Once | INTRAMUSCULAR | Status: AC
  Administered 2012-10-07: 20 mg via INTRAVENOUS
  Filled 2012-10-07: qty 2

## 2012-10-07 MED ORDER — DABIGATRAN ETEXILATE MESYLATE 150 MG PO CAPS
150.0000 mg | ORAL_CAPSULE | Freq: Two times a day (BID) | ORAL | Status: DC
  Administered 2012-10-07 – 2012-10-09 (×6): 150 mg via ORAL
  Filled 2012-10-07 (×6): qty 1

## 2012-10-07 MED ORDER — NITROGLYCERIN 0.4 MG SL SUBL: 0.4000 mg | SUBLINGUAL_TABLET | SUBLINGUAL | Status: DC | PRN

## 2012-10-07 MED ORDER — SODIUM CHLORIDE 0.9 % IJ SOLN
3.0000 mL | Freq: Two times a day (BID) | INTRAMUSCULAR | Status: DC
  Administered 2012-10-07 – 2012-10-09 (×4): 3 mL via INTRAVENOUS

## 2012-10-07 MED ORDER — ASPIRIN 81 MG PO CHEW
324.0000 mg | CHEWABLE_TABLET | Freq: Once | ORAL | Status: AC
  Administered 2012-10-07: 324 mg via ORAL
  Filled 2012-10-07: qty 4

## 2012-10-07 MED ORDER — ATORVASTATIN CALCIUM 20 MG PO TABS
20.0000 mg | ORAL_TABLET | Freq: Every day | ORAL | Status: DC
  Administered 2012-10-07 – 2012-10-08 (×2): 20 mg via ORAL
  Filled 2012-10-07 (×3): qty 1

## 2012-10-07 MED ORDER — OMEGA-3 FATTY ACIDS 1000 MG PO CAPS: 1.0000 g | ORAL_CAPSULE | Freq: Every day | ORAL | Status: DC

## 2012-10-07 MED ORDER — INSULIN ASPART 100 UNIT/ML ~~LOC~~ SOLN
0.0000 [IU] | Freq: Three times a day (TID) | SUBCUTANEOUS | Status: DC
  Administered 2012-10-07: 3 [IU] via SUBCUTANEOUS
  Administered 2012-10-08 – 2012-10-09 (×4): 2 [IU] via SUBCUTANEOUS

## 2012-10-07 MED ORDER — ALPRAZOLAM 0.25 MG PO TABS: 0.2500 mg | ORAL_TABLET | Freq: Two times a day (BID) | ORAL | Status: DC | PRN

## 2012-10-07 MED ORDER — ASPIRIN EC 81 MG PO TBEC
81.0000 mg | DELAYED_RELEASE_TABLET | Freq: Every day | ORAL | Status: DC
  Administered 2012-10-07: 81 mg via ORAL
  Filled 2012-10-07 (×2): qty 1

## 2012-10-07 MED ORDER — ACETAMINOPHEN 325 MG PO TABS: 650.0000 mg | ORAL_TABLET | ORAL | Status: DC | PRN

## 2012-10-07 MED ORDER — ONDANSETRON HCL 4 MG/2ML IJ SOLN: 4.0000 mg | Freq: Four times a day (QID) | INTRAMUSCULAR | Status: DC | PRN

## 2012-10-07 MED ORDER — SODIUM CHLORIDE 0.9 % IJ SOLN: 3.0000 mL | INTRAMUSCULAR | Status: DC | PRN

## 2012-10-07 MED ORDER — METOPROLOL SUCCINATE ER 50 MG PO TB24
50.0000 mg | ORAL_TABLET | ORAL | Status: DC
  Administered 2012-10-08: 50 mg via ORAL
  Filled 2012-10-07 (×3): qty 1

## 2012-10-07 MED ORDER — HEPARIN (PORCINE) IN NACL 100-0.45 UNIT/ML-% IJ SOLN
850.0000 [IU]/h | INTRAMUSCULAR | Status: DC
  Administered 2012-10-07: 850 [IU]/h via INTRAVENOUS
  Filled 2012-10-07: qty 250

## 2012-10-07 MED ORDER — SODIUM CHLORIDE 0.9 % IV SOLN: 250.0000 mL | INTRAVENOUS | Status: DC | PRN

## 2012-10-07 MED ORDER — ASPIRIN 81 MG PO TABS: 81.0000 mg | ORAL_TABLET | Freq: Every day | ORAL | Status: DC

## 2012-10-07 NOTE — ED Notes (Signed)
Pt hx of 2 MI in past.  Pt presents with ST hr 143.  Pt denies any pain, denies N/V dizziness, Pt denies SOB.  Pt alert oriented X4.  Pt took 2 nitro at home to bring BP down.

## 2012-10-07 NOTE — ED Provider Notes (Signed)
History     CSN: [id number]  Arrival date & time 10/07/12  0720   First MD Initiated Contact with Patient 10/07/12 [phone]      Chief Complaint  Patient presents with  . Hypertension    HPI  Patient presents with concerns palpitations, and general sense of unease.  She notes mild symptoms yesterday, but no discrete significant difference until this morning when she woke approximately 2 hours ago with palpitations, mild fatigue.  Since onset symptoms have improved without clear intervention beyond taking a SLNG.  On my evaluation she denies any current palpitations or other focal complaints. She describes two prior MI, w no sig symptoms proceeding either event (in the distant past). She is concerned that she consumed excessive amounts of sugar in the past few days.  Past Medical History  Diagnosis Date  . CAD (coronary artery disease)   . Hyperlipidemia   . Diabetes mellitus   . MVP (mitral valve prolapse)     Past Surgical History  Procedure Laterality Date  . Tonsillectomy    . Coronary angioplasty      Status post PTCA and stenting of the first obtuse marginal-05/13/2006. We placed a 3.0 x 20 mm Taxus stent. It was post dilated using a 3.25 mm noncompliant balloon up to 14 atmospheres    Family History  Problem Relation Age of Onset  . Heart attack Father   . Heart attack Mother   . Heart attack Brother   . Coronary artery disease Sister     CABG  . Alzheimer's disease Sister     X3    History  Substance Use Topics  . Smoking status: Former Smoker -- 1.00 packs/day    Types: Cigarettes  . Smokeless tobacco: Not on file  . Alcohol Use: No    OB History   Grav Para Term Preterm Abortions TAB SAB Ect Mult Living                  Review of Systems  Constitutional:       Per HPI, otherwise negative  HENT:       Per HPI, otherwise negative  Respiratory:       Per HPI, otherwise negative  Cardiovascular:       Per HPI, otherwise negative  Gastrointestinal:  Negative for vomiting.  Endocrine:       Negative aside from HPI  Genitourinary:       Neg aside from HPI   Musculoskeletal:       Per HPI, otherwise negative  Skin: Negative.   Neurological: Negative for syncope.    Allergies  Demerol; Erythromycin; Penicillins; and Percocet  Home Medications   Current Outpatient Rx  Name  Route  Sig  Dispense  Refill  . aspirin 81 MG tablet   Oral   Take 81 mg by mouth daily.           [name] atorvastatin (LIPITOR) 20 MG tablet   Oral   Take 1 tablet (20 mg total) by mouth daily.   90 tablet   3   . fish oil-omega-3 fatty acids 1000 MG capsule   Oral   Take 1 g by mouth daily.          . metFORMIN (GLUMETZA) 500 MG (MOD) 24 hr tablet   Oral   Take 500 mg by mouth daily.           . metoprolol succinate (TOPROL-XL) 50 MG 24 hr tablet  TAKE ONE TABLET BY MOUTH ONE TIME DAILY   90 tablet   3   . nitroGLYCERIN (NITROSTAT) 0.4 MG SL tablet   Sublingual   Place 1 tablet (0.4 mg total) under the tongue every 5 (five) minutes as needed. Up to 3 doses, if pain continues, call 911.   25 tablet   4   . VITAMIN D, CHOLECALCIFEROL, PO   Oral   Take 1,600 Units by mouth.             BP 113/89  Pulse 145  Temp(Src) 97.7 F (36.5 C) (Oral)  Resp 17  SpO2 97%  Physical Exam  Nursing note and vitals reviewed. Constitutional: She is oriented to person, place, and time. She appears well-developed and well-nourished. No distress.  HENT:  Head: Normocephalic and atraumatic.  Eyes: Conjunctivae and EOM are normal.  Cardiovascular: Normal rate and regular rhythm.   Murmur heard. Pulmonary/Chest: Effort normal and breath sounds normal. No stridor. No respiratory distress.  Abdominal: She exhibits no distension.  Musculoskeletal: She exhibits no edema.  Neurological: She is alert and oriented to person, place, and time. No cranial nerve deficit.  Skin: Skin is warm and dry.  Psychiatric: She has a normal mood and affect.     ED Course  Procedures (including critical care time)  Labs Reviewed  PRO B NATRIURETIC PEPTIDE  CBC  COMPREHENSIVE METABOLIC PANEL  PROTIME-INR   No results found.   No diagnosis found.   Date: 10/07/2012  Rate: 144  Rhythm: atrial flutter  QRS Axis: normal  Intervals: aflutter  ST/T Wave abnormalities: nonspecific ST/T changes  Conduction Disutrbances:none  Narrative Interpretation:   Old EKG Reviewed: changes noted Previously in sr  Abnormal  Following placement in the room, the patient converted to NSR.  Cardiac: 85sr, normal O2- 99%ra, normal  Labs notable for elevated BNP MDM  This patient presents with palpitations, and on initial evaluation has new dysrhythmia.  Patient's ED course she converted spontaneously him remained largely in no distress with unremarkable vital signs.  Patient's labs are notable for elevated BNP, and given this, the aforementioned dysrhythmia, she was admitted for further evaluation and management.        Carmin Muskrat, MD 10/07/12 1058

## 2012-10-07 NOTE — Consult Note (Signed)
ELECTROPHYSIOLOGY CONSULT NOTE  Patient ID: Shannon [name] MRN: [id number], DOB/AGE: Jul 21, 1931   Admit date: 10/07/2012 Date of Consult: 10/07/2012  Primary Physician: Haywood Pao, MD Primary Cardiologist: Acie Fredrickson, MD Reason for Consultation: Atrial flutter  History of Present Illness Shannon [name] is a 79 year old woman with CAD s/p PCI 2007 (mild-mod LV dysfunction, EF 40% at that time), low risk Myoview stress with normal LV function in 2008, dyslipidemia and DM who presents with palpitations and fatigue. She reports celebrating her 81th birthday on Sunday, eating foods she does not normally eat on a daily basis which included chocolate cake, chocolate fudge and caffeine. She then developed palpitations where she felt her heart was vibrating or quivering. However this was unaccompanied by CP, SOB, dizzness or near syncope/syncope. Yesterday she felt fatigued "washed out" but had no specific concerns/symptoms. This morning she woke up with sustained tachypalpitations. Her home BP monitor recorded her pulse as 144 bpm. She denies any CP, SOB, dizziness or near syncope/syncope. Given her persistent symptoms, she came in to [facility] ED where she was found to have rapid atrial flutter. She is hemodynamically stable. We have been asked to see her and provide EP recommendations.    Past Medical History Past Medical History  Diagnosis Date  . CAD (coronary artery disease)     a. 05/2006 Cath/PCI: LAD 40m, D1 40 ost, LCX nl, OM1 95 (3.0x20 Taxus DES), RCA nl, EF 40% w/ lat AK;  04/2007 Ex MV: minimal lat ischemia in area of prior infarct->low risk-> med Rx.  [name] Hyperlipidemia   . Diabetes mellitus   . MVP (mitral valve prolapse)     a. 10/2003 Echo: nl LV fxn, mild MR/TR/AS    Past Surgical History Past Surgical History  Procedure Laterality Date  . Tonsillectomy    . Coronary angioplasty      Status post PTCA and stenting of the first obtuse marginal-05/13/2006. We placed a 3.0 x 20 mm Taxus  stent. It was post dilated using a 3.25 mm noncompliant balloon up to 14 atmospheres     Allergies/Intolerances Allergies  Allergen Reactions  . Demerol Other (See Comments)    Burning sensation all over body  . Erythromycin Nausea And Vomiting  . Penicillins Other (See Comments)    unknown  . Percocet (Oxycodone-Acetaminophen) Swelling    Current Home Medications   Medication List    ASK your doctor about these medications       aspirin 81 MG tablet  Take 81 mg by mouth daily.     atorvastatin 20 MG tablet  Commonly known as:  LIPITOR  Take 20 mg by mouth daily.     fish oil-omega-3 fatty acids 1000 MG capsule  Take 1 g by mouth daily.     metFORMIN 500 MG (MOD) 24 hr tablet  Commonly known as:  GLUMETZA  Take 500 mg by mouth daily.     metoprolol succinate 50 MG 24 hr tablet  Commonly known as:  TOPROL-XL  Take 50 mg by mouth daily. Take with or immediately following a meal.     nitroGLYCERIN 0.4 MG SL tablet  Commonly known as:  NITROSTAT  Place 0.4 mg under the tongue every 5 (five) minutes x 3 doses as needed for chest pain. Up to 3 doses, if pain continues, call 911.     VITAMIN D (CHOLECALCIFEROL) PO  Take 2,000 Units by mouth.       Family History Family History  Problem Relation Age of Onset  .  Heart attack Father   . Heart attack Mother   . Heart attack Brother   . Coronary artery disease Sister     CABG  . Alzheimer's disease Sister     X3     Social History Social History  . Marital Status: Married   Social History Main Topics  . Smoking status: Former Smoker -- 1 pack/day    Types: Cigarettes    Quit date: 07/03/1963  . Smokeless tobacco: No  . Alcohol Use: No  . Drug Use: No   Social History Narrative   Lives in [place] with husband.  Does not routinely exercise but is able to keep house and go food shopping.    Review of Systems General: No chills, fever, night sweats or weight changes  Cardiovascular:  No chest pain, dyspnea on  exertion, edema, orthopnea, palpitations, paroxysmal nocturnal dyspnea Dermatological: No rash, lesions or masses Respiratory: No cough, dyspnea Urologic: No hematuria, dysuria Abdominal: No nausea, vomiting, diarrhea, bright red blood per rectum, melena, or hematemesis Neurologic: No visual changes, weakness, changes in mental status All other systems reviewed and are otherwise negative except as noted above.  Physical Exam Blood pressure 114/79, pulse 103, temperature 97.7 F (36.5 C), temperature source Oral, resp. rate 17, SpO2 97.00%.  General: Well developed, well appearing 79 year old female in no acute distress. HEENT: Normocephalic, atraumatic. EOMs intact. Sclera nonicteric. Oropharynx clear.  Neck: Supple without bruits. No JVD. Lungs: Respirations regular and unlabored, CTA bilaterally. No wheezes, rales or rhonchi. Heart: RRR. S1, S2 present. No murmurs, rub, S3 or S4. Abdomen: Soft, non-tender, non-distended. BS present x 4 quadrants. No hepatosplenomegaly.  Extremities: No clubbing, cyanosis or edema. DP/PT/Radials 2+ and equal bilaterally. Psych: Normal affect. Neuro: Alert and oriented X 3. Moves all extremities spontaneously. Musculoskeletal: No kyphosis. Skin: Intact. Warm and dry. No rashes or petechiae in exposed areas.   Labs POC troponin 0.02 ProBNP 4302  Lab Results  Component Value Date   WBC 6.2 10/07/2012   HGB 11.9* 10/07/2012   HCT 33.3* 10/07/2012   MCV 91.0 10/07/2012   PLT 176 10/07/2012    Recent Labs Lab 10/07/12 0754  NA 143  K 3.8  CL 107  CO2 23  BUN 10  CREATININE 0.78  CALCIUM 9.0  PROT 6.4  BILITOT 0.5  ALKPHOS 71  ALT 9  AST 18  GLUCOSE 136*   No components found with this basename: MAGNESIUM,  No results found for this basename: TSH, T4TOTAL, FREET3, T3FREE, THYROIDAB,  in the last 72 hours   Recent Labs  10/07/12 0754  INR 1.04    Radiology/Studies Dg Chest 2 View  10/07/2012  *RADIOLOGY REPORT*  Clinical Data:  Hypertension and tachycardia.  No chest pain.  CHEST - 2 VIEW  Comparison: 05/11/2006  Findings: There is a small nodular density in the upper chest on both sides.  This may be related to ECG stickers.  Otherwise, the lungs are clear.  There is mild hyperinflation of the lungs and increased lucency in the lungs.  There is no focal airspace disease or pulmonary edema.  The heart and mediastinum are within normal limits.  IMPRESSION: No active cardiopulmonary disease.   Original Report Authenticated By: Markus Daft, M.D.     Echocardiogram  pending  12-lead ECG shows a narrow complex tachycardia - atrial flutter with 2:1 conduction at 144 bpm   Assessment and Plan 1. Atrial flutter Shannon [name] presents with symptomatic atrial flutter. We agree with updating her echo  since her last one was done in 2005. It will be helpful to evaluate her LV function and LA dimension which will guide our recommendations for treatment. We agree with IV heparin. Her CHADS2-VASc score is 6 so she will need chronic anticoagulation for embolic prophylaxis. Treatment options for atrial flutter were reviewed in detail including medical therapy including AAD therapy (excluding class IC agents given her h/o CAD) versus EPS +RF ablation. Dr. Rayann Heman to see and discuss options with Shannon [name] further.   Signed, Ileene Hutchinson, PA-C 10/07/2012, 10:41 AM   I have seen, examined the patient, and reviewed the above assessment and plan.  Changes to above are made where necessary.  The patient presents with symptomatic atrial flutter.  Her stroke risks are high. I will stop heparin and start pradaxa 150mg  BID (CrCl is 58). Therapeutic strategies for atrial flutter including medicine and ablation were discussed in detail with the patient today. Risk, benefits, and alternatives to EP study and radiofrequency ablation were also discussed in detail today. These risks include but are not limited to stroke, bleeding, vascular damage,  tamponade, perforation, damage to the heart and other structures, AV block requiring pacemaker, worsening renal function, and death. The patient understands these risk and wishes to proceed.  We will therefore proceed with TEE guided catheter ablation on Thursday (after adequate dosing of pradaxa).  Gentle rate control in the interim.  Check 2D echo and TFTS.   Co Sign: Thompson Grayer, MD 10/07/2012 3:37 PM

## 2012-10-07 NOTE — Progress Notes (Signed)
Patient's HR is still between 145-150, patient's cardizem IV has been titrated according to orders; will continue to monitor patient. Ruben Im RN

## 2012-10-07 NOTE — Progress Notes (Signed)
CMT reports patient's HR is in the 140s; PA notified and orders given; will continue to monitor patient. Ruben Im RN

## 2012-10-07 NOTE — ED Notes (Signed)
Pt. Stated, When i got up this am at 630 my BP was 122/114, and my P was 142.  I took a a Nitro at 626 to slow my heart rate down.  No pain

## 2012-10-07 NOTE — Progress Notes (Signed)
ANTICOAGULATION CONSULT NOTE - Initial Consult  Pharmacy Consult for heparin Indication: afib, ?ACS  Allergies  Allergen Reactions  . Demerol Other (See Comments)    Burning sensation all over body  . Erythromycin Nausea And Vomiting  . Penicillins Other (See Comments)    unknown  . Percocet (Oxycodone-Acetaminophen) Swelling    Patient Measurements:   Heparin Dosing Weight: 66.8kg  Vital Signs: Temp: 97.7 F (36.5 C) (04/08 0726) Temp src: Oral (04/08 0726) BP: 114/79 mmHg (04/08 1028) Pulse Rate: 103 (04/08 1028)  Labs:  Recent Labs  10/07/12 0754  HGB 11.9*  HCT 33.3*  PLT 176  LABPROT 13.5  INR 1.04  CREATININE 0.78    The CrCl is unknown because both a height and weight (above a minimum accepted value) are required for this calculation.   Medical History: Past Medical History  Diagnosis Date  . CAD (coronary artery disease)     a. 05/2006 Cath/PCI: LAD 33m, D1 40 ost, LCX nl, OM1 95 (3.0x20 Taxus DES), RCA nl, EF 40% w/ lat AK;  04/2007 Ex MV: minimal lat ischemia in area of prior infarct->low risk-> med Rx.  [name] Hyperlipidemia   . Diabetes mellitus   . MVP (mitral valve prolapse)     a. 10/2003 Echo: nl LV fxn, mild MR/TR/AS   Medications:  Infusions:  . heparin    . heparin     Assessment: 41 yof presented to the ED with fatigue and palpitations and found to be in afib with RVR. Pt is anemic with H/H of 11.9/33.3, plts 179. Pts INR is normal and she is not on any anticoagulation.   Goal of Therapy:  Heparin level 0.3-0.7 units/ml Monitor platelets by anticoagulation protocol: Yes   Plan:  1. Heparin bolus 4000 units IV x 1 2. Heparin gtt 850untis/hr 3. Check an 8 hour heparin level 4. Daily heparin level and CBC 5. F/u anticoag plans  Quron Ruddy, Rande Lawman 10/07/2012,10:53 AM

## 2012-10-07 NOTE — H&P (Signed)
Patient ID: Shannon [name] MRN: [id number], DOB/AGE: 1932/03/22   Admit date: 10/07/2012  Primary Physician: Haywood Pao, MD Primary Cardiologist: Joaquim Nam, MD  Pt. Profile:  79 y/o female with h/o CAD who presented to the ED this AM with fatigue and palpitations.  Problem List  Past Medical History  Diagnosis Date  . CAD (coronary artery disease)     a. 05/2006 Cath/PCI: LAD 19m, D1 40 ost, LCX nl, OM1 95 (3.0x20 Taxus DES), RCA nl, EF 40% w/ lat AK;  04/2007 Ex MV: minimal lat ischemia in area of prior infarct->low risk-> med Rx.  [name] Hyperlipidemia   . Diabetes mellitus   . MVP (mitral valve prolapse)     a. 10/2003 Echo: nl LV fxn, mild MR/TR/AS    Past Surgical History  Procedure Laterality Date  . Tonsillectomy    . Coronary angioplasty      Status post PTCA and stenting of the first obtuse marginal-05/13/2006. We placed a 3.0 x 20 mm Taxus stent. It was post dilated using a 3.25 mm noncompliant balloon up to 14 atmospheres    Allergies  Allergies  Allergen Reactions  . Demerol Other (See Comments)    Burning sensation all over body  . Erythromycin Nausea And Vomiting  . Penicillins Other (See Comments)    unknown  . Percocet (Oxycodone-Acetaminophen) Swelling   HPI  79 y/o female with the above problem list.  She turned 81 on Sunday and in doing so says that she overindulged in chocolate, sugar, salt, and caffeine. She says that at one point on Sunday, she felt as though her heart were vibrating instead of being. She pointed this out to her husband felt as though she might have been in atrial fibrillation (see her husband has A. fib and she is familiar with it). Yesterday, she felt fatigued and malaise throughout the day without any specific complaints. She did not have chest pain and dyspnea, or palpitations. This morning, upon awakening, she immediately noted tachypalpitations. She checked her blood pressure using her automated cuff and her heart rate was  recorded at 144 beats per minute period she was not having any chest pain, dyspnea, or presyncope. She took 2 sublingual nitroglycerin tablets over a 20 minute period without any change in palpitations and at that point drove herself to the ED. Here, she was found to be in atrial flutter with a rapid response at 144 beats per minute. Shortly after arrival, her heart rate slowed and then she subsequently converted to sinus for a brief period. She has since however, had recurrence of atrial flutter with associated palpitations. Her initial troponin is normal while her proBNP is elevated and she was given 20mg  of IV lasix.  She has no evidence of volume overload. She is currently back in atrial flutter and otherwise hemodynamically stable.  Home Medications  Prior to Admission medications   Medication Sig Start Date End Date Taking? Authorizing Provider  aspirin 81 MG tablet Take 81 mg by mouth daily.     Yes Historical Provider, MD  atorvastatin (LIPITOR) 20 MG tablet Take 20 mg by mouth daily. 01/07/12 01/06/13 Yes Thayer Headings, MD  fish oil-omega-3 fatty acids 1000 MG capsule Take 1 g by mouth daily.    Yes Historical Provider, MD  metFORMIN (GLUMETZA) 500 MG (MOD) 24 hr tablet Take 500 mg by mouth daily.     Yes Historical Provider, MD  metoprolol succinate (TOPROL-XL) 50 MG 24 hr tablet Take 50 mg by mouth daily.  Take with or immediately following a meal.   Yes Historical Provider, MD  nitroGLYCERIN (NITROSTAT) 0.4 MG SL tablet Place 0.4 mg under the tongue every 5 (five) minutes x 3 doses as needed for chest pain. Up to 3 doses, if pain continues, call 911. 06/05/11  Yes Thayer Headings, MD  VITAMIN D, CHOLECALCIFEROL, PO Take 2,000 Units by mouth.    Yes Historical Provider, MD   Family History  Family History  Problem Relation Age of Onset  . Heart attack Father   . Heart attack Mother   . Heart attack Brother   . Coronary artery disease Sister     CABG  . Alzheimer's disease Sister     X3    Social History  History   Social History  . Marital Status: Married    Spouse Name: N/A    Number of Children: N/A  . Years of Education: N/A   Occupational History  . Not on file.   Social History Main Topics  . Smoking status: Former Smoker -- 1.00 packs/day    Types: Cigarettes    Quit date: 07/03/1963  . Smokeless tobacco: Not on file  . Alcohol Use: No  . Drug Use: No  . Sexually Active: Not on file   Other Topics Concern  . Not on file   Social History Narrative   Lives in [place] with husband.  Does not routinely exercise but is able to keep house and go food shopping.    Review of Systems General:  She experienced fatigue and malaise yesterday and also this morning. No chills, fever, night sweats or weight changes.  Cardiovascular:  She has been having intermittent palpitations dating back at least to Sunday and more so this morning. No chest pain, dyspnea on exertion, edema, orthopnea, paroxysmal nocturnal dyspnea. Dermatological: No rash, lesions/masses Respiratory: No cough, dyspnea Urologic: No hematuria, dysuria Abdominal:   No nausea, vomiting, diarrhea, bright red blood per rectum, melena, or hematemesis Neurologic:  No visual changes, wkns, changes in mental status. All other systems reviewed and are otherwise negative except as noted above.  Physical Exam  Blood pressure 113/89, pulse 145, temperature 97.7 F (36.5 C), temperature source Oral, resp. rate 17, SpO2 97.00%.  General: Pleasant, NAD Psych: Normal affect. Neuro: Alert and oriented X 3. Moves all extremities spontaneously. HEENT: Normal  Neck: Supple without bruits or JVD. Lungs:  Resp regular and unlabored, CTA. Heart: RRR, tachycardic, no s3, s4, or murmurs. Abdomen: Soft, non-tender, non-distended, BS + x 4.  Extremities: No clubbing, cyanosis or edema. DP/PT/Radials 2+ and equal bilaterally.  Labs  Trop i, poc: 0.02 Pbnp: 4302.0  Lab Results  Component Value Date   WBC 6.2  10/07/2012   HGB 11.9* 10/07/2012   HCT 33.3* 10/07/2012   MCV 91.0 10/07/2012   PLT 176 10/07/2012     Recent Labs Lab 10/07/12 0754  NA 143  K 3.8  CL 107  CO2 23  BUN 10  CREATININE 0.78  CALCIUM 9.0  PROT 6.4  BILITOT 0.5  ALKPHOS 71  ALT 9  AST 18  GLUCOSE 136*   Radiology/Studies  Dg Chest 2 View  10/07/2012  *RADIOLOGY REPORT*  Clinical Data: Hypertension and tachycardia.  No chest pain.  CHEST - 2 VIEW  Comparison: 05/11/2006  Findings: There is a small nodular density in the upper chest on both sides.  This may be related to ECG stickers.  Otherwise, the lungs are clear.  There is mild hyperinflation of the  lungs and increased lucency in the lungs.  There is no focal airspace disease or pulmonary edema.  The heart and mediastinum are within normal limits.  IMPRESSION: No active cardiopulmonary disease.   Original Report Authenticated By: Markus Daft, M.D.    ECG  Aflutter 144, no acute st/t changes.  ASSESSMENT AND PLAN  1. Paroxysmal atrial flutter with rapid ventricular response: Patient has been experiencing intermittent palpitations dating back 2 days and it has been more persistent this morning. She's been found to be in atrial flutter and she had slowed and subsequently converted without medical therapy however a flutter has recurred.  Other than palpitations, she is asymptomatic , though she has had fatigue and malaise over the past day and a half which has been present in the absence of palpitations as well. We'll plan to admit, cycle enzymes, check thyroid function, magnesium, and echocardiogram.  Add dilt gtt for rate control.  CHA2DS2VASc = 5 (age > 59, DM, Vascular dzs, and female) - will heparinize for now with plan for oral anticoagulation.  Will ask EP to eval for consideration of rfca.  2.  CAD:  No chest pain recently.  Cont asa, statin, bb.  3.  HL:  Cont statin.  4.  DM:  Hold metformin while in house.  Add ssi.   Signed, Murray Hodgkins, NP 10/07/2012,  10:01 AM  Patient seen and examined independently. Emeline Gins, NP note reviewed carefully - agree with his assessment and plan. I have edited the note based on my findings.   She has paroxysmal AFL with brief periods on NSR. Will likely need ablation (versus anti-arrhythmic). Start heparin and diltiazem drip. Check echo. Will need EP to see.  Chaitanya Amedee,MD 10:32 AM

## 2012-10-07 NOTE — ED Notes (Signed)
Called pharmacy re heparin

## 2012-10-07 NOTE — Progress Notes (Signed)
Patient's HR is now 69-90 and is stable; will continue to monitor patient. Ruben Im RN

## 2012-10-07 NOTE — Progress Notes (Signed)
Utilization review completed.  P.J. Shaquita Fort,RN,BSN Case Manager [phone]  

## 2012-10-08 DIAGNOSIS — I4892 Unspecified atrial flutter: Secondary | ICD-10-CM

## 2012-10-08 DIAGNOSIS — I2510 Atherosclerotic heart disease of native coronary artery without angina pectoris: Secondary | ICD-10-CM

## 2012-10-08 DIAGNOSIS — I369 Nonrheumatic tricuspid valve disorder, unspecified: Secondary | ICD-10-CM

## 2012-10-08 LAB — GLUCOSE, CAPILLARY: Glucose-Capillary: 125 mg/dL — ABNORMAL HIGH (ref 70–99)

## 2012-10-08 LAB — BASIC METABOLIC PANEL
CO2: 26 mEq/L (ref 19–32)
Calcium: 8.8 mg/dL (ref 8.4–10.5)
Glucose, Bld: 138 mg/dL — ABNORMAL HIGH (ref 70–99)
Sodium: 138 mEq/L (ref 135–145)

## 2012-10-08 LAB — LIPID PANEL
Total CHOL/HDL Ratio: 2.2 RATIO
VLDL: 21 mg/dL (ref 0–40)

## 2012-10-08 LAB — CBC
Hemoglobin: 11.8 g/dL — ABNORMAL LOW (ref 12.0–15.0)
MCH: 32.5 pg (ref 26.0–34.0)
MCV: 93.1 fL (ref 78.0–100.0)
RBC: 3.63 MIL/uL — ABNORMAL LOW (ref 3.87–5.11)

## 2012-10-08 MED ORDER — SODIUM CHLORIDE 0.9 % IV SOLN
INTRAVENOUS | Status: DC
  Administered 2012-10-08: 22:00:00 via INTRAVENOUS

## 2012-10-08 NOTE — Progress Notes (Signed)
  Echocardiogram 2D Echocardiogram has been performed.  Ardelle Balls A 10/08/2012, 9:41 AM

## 2012-10-08 NOTE — Progress Notes (Signed)
Patient: Shannon [name] Date of Encounter: 10/08/2012, 1:42 PM Admit date: 10/07/2012     Subjective  Ms. Umbel has no new complaints. She denies CP, SOB or palpitations currently.   Objective  Physical Exam: Vitals: BP 102/69  Pulse 72  Temp(Src) 97.5 F (36.4 C) (Oral)  Resp 18  Ht 5\' 3"  (1.6 m)  Wt 140 lb 11.2 oz (63.821 kg)  BMI 24.93 kg/m2  SpO2 97% General: Well developed, well appearing 79 year old female in no acute distress. Neck: Supple. JVD not elevated. Lungs: Clear bilaterally to auscultation without wheezes, rales, or rhonchi. Breathing is unlabored. Heart: Regular S1 S2 without murmurs, rubs, or gallops.  Abdomen: Soft, non-distended. Extremities: No clubbing or cyanosis. No edema.  Distal pedal pulses are 2+ and equal bilaterally. Neuro: Alert and oriented X 3. Moves all extremities spontaneously. No focal deficits.  Intake/Output:  Intake/Output Summary (Last 24 hours) at 10/08/12 1342 Last data filed at 10/08/12 1249  Gross per 24 hour  Intake 907.47 ml  Output   1301 ml  Net -393.53 ml    Inpatient Medications:  . atorvastatin  20 mg Oral Daily  . dabigatran  150 mg Oral Q12H  . insulin aspart  0-15 Units Subcutaneous TID WC  . metoprolol succinate  50 mg Oral Q24H  . omega-3 acid ethyl esters  1 g Oral Daily  . sodium chloride  3 mL Intravenous Q12H   . diltiazem (CARDIZEM) infusion 10 mg/hr (10/08/12 0748)    Labs:  Recent Labs  10/07/12 0754 10/07/12 1359 10/08/12 0145  NA 143  --  138  K 3.8  --  3.5  CL 107  --  102  CO2 23  --  26  GLUCOSE 136*  --  138*  BUN 10  --  21  CREATININE 0.78  --  0.92  CALCIUM 9.0  --  8.8  MG  --  2.1  --     Recent Labs  10/07/12 0754  AST 18  ALT 9  ALKPHOS 71  BILITOT 0.5  PROT 6.4  ALBUMIN 3.2*    Recent Labs  10/07/12 0754 10/08/12 0145  WBC 6.2 9.1  HGB 11.9* 11.8*  HCT 33.3* 33.8*  MCV 91.0 93.1  PLT 176 178    Recent Labs  10/07/12 1359 10/07/12 1916  10/08/12 0145  TROPONINI <0.30 <0.30 <0.30    Recent Labs  10/07/12 1359  TSH 3.329    Recent Labs  10/07/12 0754  INR 1.04    Radiology/Studies: Dg Chest 2 View  10/07/2012  *RADIOLOGY REPORT*  Clinical Data: Hypertension and tachycardia.  No chest pain.  CHEST - 2 VIEW  Comparison: 05/11/2006  Findings: There is a small nodular density in the upper chest on both sides.  This may be related to ECG stickers.  Otherwise, the lungs are clear.  There is mild hyperinflation of the lungs and increased lucency in the lungs.  There is no focal airspace disease or pulmonary edema.  The heart and mediastinum are within normal limits.  IMPRESSION: No active cardiopulmonary disease.   Original Report Authenticated By: Markus Daft, M.D.     Echocardiogram: Study Conclusions - Left ventricle: The cavity size was normal. Wall thickness was normal. Systolic function was normal. The estimated ejection fraction was in the range of 60% to 65%. Wall motion was normal; there were no regional wall motion abnormalities. There was an increased relative contribution of atrial contraction to ventricular filling. - Left atrium:  The atrium was mildly dilated. 42 mm in diameter. - Right atrium: The atrium was mildly dilated. - Tricuspid valve: Moderate regurgitation. - Pulmonary arteries: Systolic pressure was mildly increased. PA peak pressure: 65mm Hg (S).   Telemetry: persistent atrial flutter, currently rate controlled    Assessment and Plan  1. Atrial flutter  2. Preserved LV function 3. CAD Ms. Righi presents with symptomatic atrial flutter. Treatment options for atrial flutter were reviewed in detail including medical therapy including AAD therapy (excluding class IC agents given her h/o CAD) versus EPS +RF ablation. She is now on Pradaxa for embolic prophylaxis. Ms. Delap would like to proceed with TEE-guided EPS +RF ablation. This is scheduled with Dr. Rayann Heman tomorrow.   Dr. Lovena Le to  see Signed, Ileene Hutchinson PA-C

## 2012-10-08 NOTE — Progress Notes (Signed)
Patient ID: Shannon [name], female   DOB: [birth year], 79 y.o.   MRN: [id number] Subjective:  No chest pain or sob.  Objective:  Vital Signs in the last 24 hours: Temp:  [97.5 F (36.4 C)-98.3 F (36.8 C)] 97.5 F (36.4 C) (04/09 0442) Pulse Rate:  [72-149] 72 (04/09 0442) Resp:  [18] 18 (04/09 0442) BP: (92-113)/(60-83) 102/69 mmHg (04/09 0442) SpO2:  [97 %-98 %] 97 % (04/09 0442) Weight:  [140 lb 11.2 oz (63.821 kg)-141 lb 4.8 oz (64.093 kg)] 140 lb 11.2 oz (63.821 kg) (04/09 0447)  Intake/Output from previous day: 04/08 0701 - 04/09 0700 In: 427.5 [P.O.:360; I.V.:67.5] Out: 900 [Urine:900] Intake/Output from this shift: Total I/O In: 480 [P.O.:480] Out: 401 [Urine:400; Stool:1]  Physical Exam: Well appearing elderly woman NAD HEENT: Unremarkable Neck:  7 cm JVD, no thyromegally Lungs:  Clear with no wheezes HEART:  Regular rate rhythm, no murmurs, no rubs, no clicks Abd:  Flat, positive bowel sounds, no organomegally, no rebound, no guarding Ext:  2 plus pulses, no edema, no cyanosis, no clubbing Skin:  No rashes no nodules Neuro:  CN II through XII intact, motor grossly intact  Lab Results:  Recent Labs  10/07/12 0754 10/08/12 0145  WBC 6.2 9.1  HGB 11.9* 11.8*  PLT 176 178    Recent Labs  10/07/12 0754 10/08/12 0145  NA 143 138  K 3.8 3.5  CL 107 102  CO2 23 26  GLUCOSE 136* 138*  BUN 10 21  CREATININE 0.78 0.92    Recent Labs  10/07/12 1916 10/08/12 0145  TROPONINI <0.30 <0.30   Hepatic Function Panel  Recent Labs  10/07/12 0754  PROT 6.4  ALBUMIN 3.2*  AST 18  ALT 9  ALKPHOS 71  BILITOT 0.5    Recent Labs  10/08/12 0145  CHOL 117   No results found for this basename: PROTIME,  in the last 72 hours  Imaging: Dg Chest 2 View  10/07/2012  *RADIOLOGY REPORT*  Clinical Data: Hypertension and tachycardia.  No chest pain.  CHEST - 2 VIEW  Comparison: 05/11/2006  Findings: There is a small nodular density in the upper chest on both  sides.  This may be related to ECG stickers.  Otherwise, the lungs are clear.  There is mild hyperinflation of the lungs and increased lucency in the lungs.  There is no focal airspace disease or pulmonary edema.  The heart and mediastinum are within normal limits.  IMPRESSION: No active cardiopulmonary disease.   Original Report Authenticated By: Markus Daft, M.D.     Cardiac Studies: Tele - atrial flutter with a controlled VR. Assessment/Plan:  1. Atrial flutter 2. Coronary artery disease Rec: after discussing the treatment options with the patient, she would like to proceed with catheter ablation. This will be performed by by partner Dr. Rayann Heman tomorrow.   LOS: 1 day    Cristopher Peru 10/08/2012, 1:40 PM

## 2012-10-09 ENCOUNTER — Encounter (HOSPITAL_COMMUNITY)
Admission: EM | Disposition: A | Discharge status: Home / Self Care | Payer: Self-pay | Source: Home / Self Care | Attending: Emergency Medicine

## 2012-10-09 ENCOUNTER — Inpatient Hospital Stay (HOSPITAL_COMMUNITY): Payer: Medicare PPO | Admitting: Anesthesiology

## 2012-10-09 ENCOUNTER — Encounter (HOSPITAL_COMMUNITY): Payer: Self-pay | Admitting: Anesthesiology

## 2012-10-09 DIAGNOSIS — R300 Dysuria: Secondary | ICD-10-CM

## 2012-10-09 DIAGNOSIS — N390 Urinary tract infection, site not specified: Secondary | ICD-10-CM

## 2012-10-09 DIAGNOSIS — I4892 Unspecified atrial flutter: Principal | ICD-10-CM

## 2012-10-09 HISTORY — PX: ATRIAL FLUTTER ABLATION: SHX5733

## 2012-10-09 LAB — URINALYSIS, ROUTINE W REFLEX MICROSCOPIC
Bilirubin Urine: NEGATIVE
Hgb urine dipstick: NEGATIVE
Nitrite: POSITIVE — AB
Protein, ur: NEGATIVE mg/dL
Urobilinogen, UA: 0.2 mg/dL (ref 0.0–1.0)

## 2012-10-09 LAB — URINE MICROSCOPIC-ADD ON

## 2012-10-09 LAB — GLUCOSE, CAPILLARY
Glucose-Capillary: 134 mg/dL — ABNORMAL HIGH (ref 70–99)
Glucose-Capillary: 137 mg/dL — ABNORMAL HIGH (ref 70–99)
Glucose-Capillary: 130 mg/dL — ABNORMAL HIGH (ref 70–99)

## 2012-10-09 SURGERY — ATRIAL FLUTTER ABLATION
Anesthesia: Monitor Anesthesia Care

## 2012-10-09 MED ORDER — ONDANSETRON HCL 4 MG/2ML IJ SOLN: 4.0000 mg | Freq: Four times a day (QID) | INTRAMUSCULAR | Status: DC | PRN

## 2012-10-09 MED ORDER — FENTANYL CITRATE 0.05 MG/ML IJ SOLN: 25.0000 ug | INTRAMUSCULAR | Status: DC | PRN

## 2012-10-09 MED ORDER — METFORMIN HCL ER (MOD) 500 MG PO TB24: 500.0000 mg | ORAL_TABLET | Freq: Every day | ORAL | Status: DC

## 2012-10-09 MED ORDER — DABIGATRAN ETEXILATE MESYLATE 150 MG PO CAPS: 150.0000 mg | ORAL_CAPSULE | Freq: Two times a day (BID) | ORAL | Status: DC

## 2012-10-09 MED ORDER — PROPOFOL 10 MG/ML IV BOLUS
INTRAVENOUS | Status: DC | PRN
  Administered 2012-10-09: 23 mg via INTRAVENOUS

## 2012-10-09 MED ORDER — SODIUM CHLORIDE 0.9 % IV SOLN: 250.0000 mL | INTRAVENOUS | Status: DC | PRN

## 2012-10-09 MED ORDER — CIPROFLOXACIN HCL 500 MG PO TABS
500.0000 mg | ORAL_TABLET | Freq: Two times a day (BID) | ORAL | Status: DC
  Administered 2012-10-09: 500 mg via ORAL
  Filled 2012-10-09 (×2): qty 1

## 2012-10-09 MED ORDER — SODIUM CHLORIDE 0.9 % IJ SOLN: 3.0000 mL | Freq: Two times a day (BID) | INTRAMUSCULAR | Status: DC

## 2012-10-09 MED ORDER — DILTIAZEM HCL 100 MG IV SOLR
5.0000 mg/h | INTRAVENOUS | Status: DC
  Filled 2012-10-09: qty 100

## 2012-10-09 MED ORDER — OXYCODONE HCL 5 MG PO TABS: 5.0000 mg | ORAL_TABLET | Freq: Once | ORAL | Status: DC | PRN

## 2012-10-09 MED ORDER — SODIUM CHLORIDE 0.9 % IJ SOLN: 3.0000 mL | INTRAMUSCULAR | Status: DC | PRN

## 2012-10-09 MED ORDER — LIDOCAINE HCL (CARDIAC) 20 MG/ML IV SOLN
INTRAVENOUS | Status: DC | PRN
  Administered 2012-10-09: 50 mg via INTRAVENOUS

## 2012-10-09 MED ORDER — LIDOCAINE VISCOUS 2 % MT SOLN
OROMUCOSAL | Status: AC
  Filled 2012-10-09: qty 15

## 2012-10-09 MED ORDER — OXYCODONE HCL 5 MG/5ML PO SOLN: 5.0000 mg | Freq: Once | ORAL | Status: DC | PRN

## 2012-10-09 MED ORDER — CIPROFLOXACIN HCL 500 MG PO TABS: 500.0000 mg | ORAL_TABLET | Freq: Two times a day (BID) | ORAL | Status: DC

## 2012-10-09 MED ORDER — PROPOFOL INFUSION 10 MG/ML OPTIME
INTRAVENOUS | Status: DC | PRN
  Administered 2012-10-09: 50 ug/kg/min via INTRAVENOUS

## 2012-10-09 MED ORDER — FENTANYL CITRATE 0.05 MG/ML IJ SOLN
INTRAMUSCULAR | Status: DC | PRN
  Administered 2012-10-09: 50 ug via INTRAVENOUS
  Administered 2012-10-09 (×2): 25 ug via INTRAVENOUS

## 2012-10-09 MED ORDER — LACTATED RINGERS IV SOLN
INTRAVENOUS | Status: DC | PRN
  Administered 2012-10-09: 12:00:00 via INTRAVENOUS

## 2012-10-09 MED ORDER — ONDANSETRON HCL 4 MG/2ML IJ SOLN
INTRAMUSCULAR | Status: DC | PRN
  Administered 2012-10-09: 4 mg via INTRAVENOUS

## 2012-10-09 NOTE — Preoperative (Signed)
Beta Blockers   Reason not to administer Beta Blockers:Hold beta blocker due to other. Dr. Rayann Heman stopped Toprol on admission prior to AFlutter ablation

## 2012-10-09 NOTE — Discharge Summary (Signed)
Discharge Summary   Patient ID: Shannon [name],  MRN: [id number], DOB/AGE: Oct 13, 1931 79 y.o.  Admit date: 10/07/2012 Discharge date: 10/09/2012  Primary Physician: Haywood Pao, MD Primary Cardiologist: Joaquim Nam, MD Primary Electrophysiologist: Lenna Sciara. Afsana Liera, MD  Discharge Diagnoses Principal Problem:   Atrial flutter  - S/p comprehensive EPS + TEE/DCCV today restoring NSR  - No RFA due to complex non-isthmus-dependent atrial flutter, advanced age and comorbidities  - Started on Pradaxa for anticoagulation Active Problems:   CAD (coronary artery disease)   Hyperlipidemia   Diabetes mellitus   MVP (mitral valve prolapse)   UTI  - U/a revealed large amount of leukocytes, moderate nitrites  - Discharged on empiric ciprofloxacin   - Urine culture results pending to guide antibiotic therapy   Allergies Allergies  Allergen Reactions  . Demerol Other (See Comments)    Burning sensation all over body  . Erythromycin Nausea And Vomiting  . Penicillins Other (See Comments)    unknown  . Percocet (Oxycodone-Acetaminophen) Swelling    Diagnostic Studies/Procedures  PA/LATERAL CHEST X-RAY - 10/07/12  IMPRESSION:  No active cardiopulmonary disease.  TRANSTHORACIC ECHOCARDIOGRAM - 10/08/12  Study Conclusions  - Left ventricle: The cavity size was normal. Wall thickness was normal. Systolic function was normal. The estimated ejection fraction was in the range of 60% to 65%. Wall motion was normal; there were no regional wall motion abnormalities. There was an increased relative contribution of atrial contraction to ventricular filling. - Left atrium: The atrium was mildly dilated. - Right atrium: The atrium was mildly dilated. - Tricuspid valve: Moderate regurgitation. - Pulmonary arteries: Systolic pressure was mildly increased. PA peak pressure: 36mm Hg (S).  Impressions:  - Normal LV function.  PROCEDURES - 10/09/12:  1. Comprehensive EP study.  2. Coronary sinus  pacing and recording.  3. Mapping of Atrial flutter.  4. Cardioversion.  CONCLUSIONS:  1. Non Isthmus-dependent left atrial flutter upon presentation, successfully cardioverted to sinus rhythm.  2. Because of the patients advanced age, enlarged left atrium, and comorbidities complex left atrial ablation was not performed today.  3. No early apparent complications.   History of Present Illness Shannon [name] is a 79 y.o. female who was admitted to [facility] on 10/07/12 with the above problem list.  She has a history of CAD status post PCI in 2007 (mild to moderate LV dysfunction, EF 40% at that time), low risk stress Myoview with normal LV function 2008, dyslipidemia and type 2 diabetes mellitus.  She recently turned 20 and a celebration overindulge and consuming chocolate, sugar, salt and caffeine. At one point during her birthday, she felt as though her heart was "vibrating" instead of beating. The day prior to admission, she endorsed feeling fatigued and experiencing malaise throughout the day. She denied specific complaints including chest pain, dyspnea or palpitations. The morning of admission, probably can, she immediately noted tachypalpitations. She checked her blood pressure using the automated cuff and her heart rate was recorded at 144 beats per minute. No associated symptoms. She took 2 sublingual nitroglycerin glycerin tablets or 20 minute period any change in her palpitations and drove herself to the emergency department.   There, she was found to have atrial flutter with a rapid response at 144 beats per minute. Shortly after arrival, her heart rate slowed and she converted to normal sinus rhythm for a brief period. She had several recurrences of atrial flutter with associated palpitations. Initial proBNP return to elevated and she was given Lasix 20 mg IV. On  exam, she had no evidence of volume overload and she was found hemodynamically stable. Initial troponin did return  within normal limits.  The patient's atrial flutter was determined to be new in onset. She was started on a diltiazem drip for rate control with appropriate response to heart rate. Her CHA2DS2VASc score was determined to be 5. She was started on IV heparin initially for anticoagulation. She was admitted with plans to consult the electrophysiology team for further recommendations regarding management of her atrial flutter.  Hospital Course   3 subsequent set of cardiac markers returned within normal limits. Magnesium and TSH returned within normal limits. A 2-D echocardiogram was performed which revealed EF 60-65%, mild biatrial enlargement, moderate TR and PASP 42 mmHg.  Her rate remained adequately controlled in the EP team saw her the following day.. Treatment options for atrial flutter were reviewed in detail including medical therapy including AAD therapy versus EPS + RF ablation. She was started on Pradaxa for anticoagulation. Dr. Rayann Heman reviewed her echocardiogram and EKGs. The plan was made to proceed with an EP study and further evaluation for candidacy of radiofrequency ablation.  She was informed, consented and prepped for the procedure which is outlined above. The patient's left atrial flutter was found to be non-isthmus dependent. Because of the patient's advanced age, enlarged left atrium and comorbidities left atrial ablation was deemed to be too complex. She underwent a TEE revealing no evidence of LA/LAA thrombus, and was successfully cardioverted to a normal sinus rhythm. She tolerated the procedure well without complications.  Of note, she did report urinary frequency and dysuria. A urinalysis did reveal large amount of leukocytes and moderate nitrites. A urine culture is pending. She will be started on empiric ciprofloxacin until culture results are available. The patient is currently undergoing bedrest. She has remained stable. Groin site with mild bleeding, no evidence of tenderness,  swelling or bruits. She will be discharged this evening. She will receive a dose of Pradaxa and ciprofloxacin prior to discharge.   Discharge Vitals:  Blood pressure 98/77, pulse 74, temperature 97.2 F (36.2 C), temperature source Oral, resp. rate 18, height 5\' 3"  (1.6 m), weight 64 kg (141 lb 1.5 oz), SpO2 99.00%.   Weight change: 0.7 kg (1 lb 8.7 oz)  Labs: Recent Labs     10/07/12  0754  10/08/12  0145  WBC  6.2  9.1  HGB  11.9*  11.8*  HCT  33.3*  33.8*  MCV  91.0  93.1  PLT  176  178    Recent Labs Lab 10/07/12 0754 10/08/12 0145  NA 143 138  K 3.8 3.5  CL 107 102  CO2 23 26  BUN 10 21  CREATININE 0.78 0.92  CALCIUM 9.0 8.8  PROT 6.4  --   BILITOT 0.5  --   ALKPHOS 71  --   ALT 9  --   AST 18  --   GLUCOSE 136* 138*   Recent Labs     10/07/12  1359  10/07/12  1916  10/08/12  0145  TROPONINI  <0.30  <0.30  <0.30   Recent Labs     10/08/12  0145  CHOL  117  HDL  54  LDLCALC  42  TRIG  106  CHOLHDL  2.2    Recent Labs  10/07/12 1359  TSH 3.329    Disposition:   Future Appointments Provider Department Dept Phone   11/17/2012 3:45 PM Thompson Grayer, MD [facility] [place]) [phone]  Follow-up Information   Follow up with Thompson Grayer, MD On 11/17/2012. (At 3:45 PM for post-hospital electrophysiology follow-up. )    Contact information:   [address], [address]  [place] [address] [phone]      Discharge Medications:    Medication List    STOP taking these medications       aspirin 81 MG tablet      TAKE these medications       atorvastatin 20 MG tablet  Commonly known as:  LIPITOR  Take 20 mg by mouth daily.     ciprofloxacin 500 MG tablet  Commonly known as:  CIPRO  Take 1 tablet (500 mg total) by mouth 2 (two) times daily.     dabigatran 150 MG Caps  Commonly known as:  PRADAXA  Take 1 capsule (150 mg total) by mouth every 12 (twelve) hours.     fish oil-omega-3 fatty acids 1000 MG  capsule  Take 1 g by mouth daily.     metFORMIN 500 MG (MOD) 24 hr tablet  Commonly known as:  GLUMETZA  Take 1 tablet (500 mg total) by mouth daily.     metoprolol succinate 50 MG 24 hr tablet  Commonly known as:  TOPROL-XL  Take 50 mg by mouth daily. Take with or immediately following a meal.     nitroGLYCERIN 0.4 MG SL tablet  Commonly known as:  NITROSTAT  Place 0.4 mg under the tongue every 5 (five) minutes x 3 doses as needed for chest pain. Up to 3 doses, if pain continues, call 911.     VITAMIN D (CHOLECALCIFEROL) PO  Take 2,000 Units by mouth.       Outstanding Labs/Studies: None  Duration of Discharge Encounter: Greater than 30 minutes including physician time.  Signed, R. Valeria Batman, PA-C 10/09/2012, 7:32 PM   Thompson Grayer, MD

## 2012-10-09 NOTE — Progress Notes (Signed)
*  PRELIMINARY RESULTS* Echocardiogram Echocardiogram Transesophageal has been performed.  Leavy Cella 10/09/2012, 1:07 PM

## 2012-10-09 NOTE — Anesthesia Preprocedure Evaluation (Addendum)
Anesthesia Evaluation  Patient identified by MRN, date of birth, ID band Patient awake    Reviewed: Allergy & Precautions, H&P , NPO status , Patient's Chart, lab work & pertinent test results, reviewed documented beta blocker date and time   Airway Mallampati: II  Neck ROM: full    Dental  (+) Poor Dentition, Loose and Dental Advisory Given,    Pulmonary sleep apnea ,          Cardiovascular + CAD, + Past MI and + Cardiac Stents + dysrhythmias Atrial Fibrillation     Neuro/Psych    GI/Hepatic   Endo/Other  diabetes, Type 2  Renal/GU      Musculoskeletal   Abdominal   Peds  Hematology   Anesthesia Other Findings   Reproductive/Obstetrics                          Anesthesia Physical Anesthesia Plan  ASA: III  Anesthesia Plan: MAC   Post-op Pain Management:    Induction: Intravenous  Airway Management Planned: Simple Face Mask  Additional Equipment:   Intra-op Plan:   Post-operative Plan:   Informed Consent: I have reviewed the patients History and Physical, chart, labs and discussed the procedure including the risks, benefits and alternatives for the proposed anesthesia with the patient or authorized representative who has indicated his/her understanding and acceptance.     Plan Discussed with: CRNA and Surgeon  Anesthesia Plan Comments:         Anesthesia Quick Evaluation

## 2012-10-09 NOTE — H&P (View-Only) (Signed)
SUBJECTIVE: The patient is doing well today.  At this time, she denies chest pain, shortness of breath, or palpitations.  Does report that her urine has a strong odor and that she is having urinary frequency.  She denies pain with urination.   [name] atorvastatin  20 mg Oral Daily  . dabigatran  150 mg Oral Q12H  . insulin aspart  0-15 Units Subcutaneous TID WC  . metoprolol succinate  50 mg Oral Q24H  . omega-3 acid ethyl esters  1 g Oral Daily  . sodium chloride  3 mL Intravenous Q12H   . sodium chloride 10 mL/hr at 10/08/12 2215  . diltiazem (CARDIZEM) infusion 5 mg/hr (10/09/12 0146)    OBJECTIVE: Physical Exam: Filed Vitals:   10/08/12 1300 10/08/12 2035 10/08/12 2100 10/09/12 0448  BP: 99/53 109/72 115/75 107/65  Pulse: 80 80 93 61  Temp: 98 F (36.7 C) 97.8 F (36.6 C)  97.7 F (36.5 C)  TempSrc: Oral Oral  Oral  Resp: 18 18  18   Height:      Weight:    141 lb 1.5 oz (64 kg)  SpO2: 98% 99%  97%    Intake/Output Summary (Last 24 hours) at 10/09/12 [id number] Last data filed at 10/09/12 0500  Gross per 24 hour  Intake 957.33 ml  Output   1752 ml  Net -794.67 ml    Telemetry reveals atrial fib/flutter with controlled ventricular rate   GEN- The patient is well appearing, alert and oriented x 3 today.   Head- normocephalic, atraumatic Eyes-  Sclera clear, conjunctiva pink Ears- hearing intact Oropharynx- clear Neck- supple, no JVP Lymph- no cervical lymphadenopathy Lungs- Clear to ausculation bilaterally, normal work of breathing Heart- irregular rate and rhythm, no murmurs, rubs or gallops, PMI not laterally displaced GI- soft, NT, ND, + BS Extremities- no clubbing, cyanosis, or edema Neuro- strength and sensation are intact  LABS: Basic Metabolic Panel:  Recent Labs  10/07/12 0754 10/07/12 1359 10/08/12 0145  NA 143  --  138  K 3.8  --  3.5  CL 107  --  102  CO2 23  --  26  GLUCOSE 136*  --  138*  BUN 10  --  21  CREATININE 0.78  --  0.92  CALCIUM  9.0  --  8.8  MG  --  2.1  --    Liver Function Tests:  Recent Labs  10/07/12 0754  AST 18  ALT 9  ALKPHOS 71  BILITOT 0.5  PROT 6.4  ALBUMIN 3.2*   CBC:  Recent Labs  10/07/12 0754 10/08/12 0145  WBC 6.2 9.1  HGB 11.9* 11.8*  HCT 33.3* 33.8*  MCV 91.0 93.1  PLT 176 178   Cardiac Enzymes:  Recent Labs  10/07/12 1359 10/07/12 1916 10/08/12 0145  TROPONINI <0.30 <0.30 <0.30   Fasting Lipid Panel:  Recent Labs  10/08/12 0145  CHOL 117  HDL 54  LDLCALC 42  TRIG 106  CHOLHDL 2.2   Thyroid Function Tests:  Recent Labs  10/07/12 1359  TSH 3.329   RADIOLOGY: Dg Chest 2 View b4/01/2013  *RADIOLOGY REPORT*  Clinical Data: Hypertension and tachycardia.  No chest pain.  CHEST - 2 VIEW  Comparison: 05/11/2006  Findings: There is a small nodular density in the upper chest on both sides.  This may be related to ECG stickers.  Otherwise, the lungs are clear.  There is mild hyperinflation of the lungs and increased lucency in the lungs.  There is  no focal airspace disease or pulmonary edema.  The heart and mediastinum are within normal limits.  IMPRESSION: No active cardiopulmonary disease.   Original Report Authenticated By: Markus Daft, M.D.    ASSESSMENT AND PLAN:    1. Atrial flutter ekg is not completely typical in appearance.  Will proceed with TEE guided EP study.  IF isthmus dependant will proceed with ablation, if not will cardiovert. Therapeutic strategies for atrial flutter including medicine and ablation were discussed in detail with the patient today. Risk, benefits, and alternatives to TEE, EP study and radiofrequency ablation, or cardioversion were also discussed in detail today. These risks include but are not limited to stroke, bleeding, vascular damage, tamponade, perforation, damage to the heart and other structures, AV block requiring pacemaker, worsening renal function, and death. The patient understands these risk and wishes to proceed.  We will  therefore proceed with ablation later today.  Stop cardizem on call to EP lab.  2. Urinary symptoms Check UA, s/x

## 2012-10-09 NOTE — Progress Notes (Signed)
SUBJECTIVE: The patient is doing well today.  At this time, she denies chest pain, shortness of breath, or palpitations.  Does report that her urine has a strong odor and that she is having urinary frequency.  She denies pain with urination.   [name] atorvastatin  20 mg Oral Daily  . dabigatran  150 mg Oral Q12H  . insulin aspart  0-15 Units Subcutaneous TID WC  . metoprolol succinate  50 mg Oral Q24H  . omega-3 acid ethyl esters  1 g Oral Daily  . sodium chloride  3 mL Intravenous Q12H   . sodium chloride 10 mL/hr at 10/08/12 2215  . diltiazem (CARDIZEM) infusion 5 mg/hr (10/09/12 0146)    OBJECTIVE: Physical Exam: Filed Vitals:   10/08/12 1300 10/08/12 2035 10/08/12 2100 10/09/12 0448  BP: 99/53 109/72 115/75 107/65  Pulse: 80 80 93 61  Temp: 98 F (36.7 C) 97.8 F (36.6 C)  97.7 F (36.5 C)  TempSrc: Oral Oral  Oral  Resp: 18 18  18   Height:      Weight:    141 lb 1.5 oz (64 kg)  SpO2: 98% 99%  97%    Intake/Output Summary (Last 24 hours) at 10/09/12 [id number] Last data filed at 10/09/12 0500  Gross per 24 hour  Intake 957.33 ml  Output   1752 ml  Net -794.67 ml    Telemetry reveals atrial fib/flutter with controlled ventricular rate   GEN- The patient is well appearing, alert and oriented x 3 today.   Head- normocephalic, atraumatic Eyes-  Sclera clear, conjunctiva pink Ears- hearing intact Oropharynx- clear Neck- supple, no JVP Lymph- no cervical lymphadenopathy Lungs- Clear to ausculation bilaterally, normal work of breathing Heart- irregular rate and rhythm, no murmurs, rubs or gallops, PMI not laterally displaced GI- soft, NT, ND, + BS Extremities- no clubbing, cyanosis, or edema Neuro- strength and sensation are intact  LABS: Basic Metabolic Panel:  Recent Labs  10/07/12 0754 10/07/12 1359 10/08/12 0145  NA 143  --  138  K 3.8  --  3.5  CL 107  --  102  CO2 23  --  26  GLUCOSE 136*  --  138*  BUN 10  --  21  CREATININE 0.78  --  0.92  CALCIUM  9.0  --  8.8  MG  --  2.1  --    Liver Function Tests:  Recent Labs  10/07/12 0754  AST 18  ALT 9  ALKPHOS 71  BILITOT 0.5  PROT 6.4  ALBUMIN 3.2*   CBC:  Recent Labs  10/07/12 0754 10/08/12 0145  WBC 6.2 9.1  HGB 11.9* 11.8*  HCT 33.3* 33.8*  MCV 91.0 93.1  PLT 176 178   Cardiac Enzymes:  Recent Labs  10/07/12 1359 10/07/12 1916 10/08/12 0145  TROPONINI <0.30 <0.30 <0.30   Fasting Lipid Panel:  Recent Labs  10/08/12 0145  CHOL 117  HDL 54  LDLCALC 42  TRIG 106  CHOLHDL 2.2   Thyroid Function Tests:  Recent Labs  10/07/12 1359  TSH 3.329   RADIOLOGY: Dg Chest 2 View b4/01/2013  *RADIOLOGY REPORT*  Clinical Data: Hypertension and tachycardia.  No chest pain.  CHEST - 2 VIEW  Comparison: 05/11/2006  Findings: There is a small nodular density in the upper chest on both sides.  This may be related to ECG stickers.  Otherwise, the lungs are clear.  There is mild hyperinflation of the lungs and increased lucency in the lungs.  There is  no focal airspace disease or pulmonary edema.  The heart and mediastinum are within normal limits.  IMPRESSION: No active cardiopulmonary disease.   Original Report Authenticated By: Markus Daft, M.D.    ASSESSMENT AND PLAN:    1. Atrial flutter ekg is not completely typical in appearance.  Will proceed with TEE guided EP study.  IF isthmus dependant will proceed with ablation, if not will cardiovert. Therapeutic strategies for atrial flutter including medicine and ablation were discussed in detail with the patient today. Risk, benefits, and alternatives to TEE, EP study and radiofrequency ablation, or cardioversion were also discussed in detail today. These risks include but are not limited to stroke, bleeding, vascular damage, tamponade, perforation, damage to the heart and other structures, AV block requiring pacemaker, worsening renal function, and death. The patient understands these risk and wishes to proceed.  We will  therefore proceed with ablation later today.  Stop cardizem on call to EP lab.  2. Urinary symptoms Check UA, s/x

## 2012-10-09 NOTE — Transfer of Care (Signed)
Immediate Anesthesia Transfer of Care Note  Patient: Shannon [name]  Procedure(s) Performed: Procedure(s): ATRIAL FLUTTER ABLATION (N/A)  Patient Location: PACU and Cath Lab  Anesthesia Type:MAC  Level of Consciousness: awake, oriented and patient cooperative  Airway & Oxygen Therapy: Patient Spontanous Breathing and Patient connected to face mask oxygen  Post-op Assessment: Report given to PACU RN, Post -op Vital signs reviewed and stable and Patient moving all extremities X 4  Post vital signs: Reviewed and stable  Complications: No apparent anesthesia complications

## 2012-10-09 NOTE — Interval H&P Note (Signed)
History and Physical Interval Note:  10/09/2012 7:42 AM  Shannon [name]  has presented today for surgery, with the diagnosis of Aflutter  The various methods of treatment have been discussed with the patient and family. After consideration of risks, benefits and other options for treatment, the patient has consented to  Procedure(s): ATRIAL FLUTTER ABLATION (N/A) as a surgical intervention .  The patient's history has been reviewed, patient examined, no change in status, stable for surgery.  I have reviewed the patient's chart and labs.  Questions were answered to the patient's satisfaction.     Thompson Grayer

## 2012-10-09 NOTE — Op Note (Signed)
PREPROCEDURE DIAGNOSIS:   Atrial flutter  POSTPROCEDURE DIAGNOSIS: Left atrial flutter.   PROCEDURES:  1. Comprehensive EP study.  2. Coronary sinus pacing and recording.  3. Mapping of Atrial flutter.  4. Cardioversion.  INTRODUCTION:  Shannon [name] is a 79 y.o. female with a history of symptomatic atrial flutter. She presents today for EP study and radiofrequency ablation.   DESCRIPTION OF THE PROCEDURE: Informed written consent was obtained, and the patient was brought to the electrophysiology lab in the fasting state. The patient was then adequately sedated with anesthesia as outlined in the anesthesia report. The patient's right groin was prepped and draped in the usual sterile fashion by the EP lab staff. Using a percutaneous Seldinger technique two 6, and one 8-French hemostasis sheaths were placed into the right common femoral vein. A 6- [employer] decapolar coronary sinus catheter was introduced through the right common femoral vein and advanced into the coronary sinus for recording and pacing from this location. A 6-French quadripolar Josephson catheter was introduced through the right common femoral vein and advanced into the right ventricle for recording and pacing. This catheter was then pulled back to the His bundle location. The patient presented to the electrophysiology lab in atrial flutter. The surface electrocardiogram was not completely consistent with typical atrial flutter (positive in V1 and also slightly positive in the inferior leads). The coronary sinus activation sequence was eccentric and suggestive of left atrial flutter.  The atrial flutter cycle length was 250 msec. Atrial entrainment mapping was then performed. When pacing from the left atrium, a slightly lengthened post pacing interval was observed. With atrial entrainment mapping from the cavotricuspid isthmus, the post pacing interval was even greater when compared to the tachycardia cycle length and  therefore suggestive of nonisthmus-dependent and probably left atrial flutter. The patient's QRS duration measured 95 msec with an RR interval of 976 msec and an HV interval of 41 msec. I elected to not perform ablation for left atrial flutter today due to the patients advanced age and comorbidities.  I elected to perform cardioversion instead. She was successfully cardioverted to sinus rhythm with a single synchronized biphasic 360J shock delivered with cardioversion electrodes placed in the anterior/posterior configuration.  She remains in sinus rhythm thereafter.   Following cardioversion, the AH interval measured 62 msec with an HV interval of 40 msec. Rapid atrial pacing was performed, which revealed an AV Wenckebach cycle length of 470 msec with no evidence of PR greater than RR and no tachycardias induced when pacing down to a cycle length of 300 msec. Ventricular pacing was then performed which revealed midline concentric VA conduction with a VA blockcycle length of 390 msec.  The procedure was therefore considered completed. All catheters were removed, and the sheaths were aspirated and flushed. The sheaths were removed and hemostasis was assured. There were no early apparent complications.   CONCLUSIONS:  1. Non Isthmus-dependent left atrial flutter upon presentation, successfully cardioverted to sinus rhythm.  2. Because of the patients advanced age, enlarged left atrium, and comorbidities complex left atrial ablation was not performed today.  3. No early apparent complications.  Shannon Rinks Eavan Gonterman,MD 10/09/2012 1:48 PM

## 2012-10-10 NOTE — Anesthesia Postprocedure Evaluation (Signed)
Anesthesia Post Note  Patient: Shannon [name]  Procedure(s) Performed: Procedure(s) (LRB): ATRIAL FLUTTER ABLATION (N/A)  Anesthesia type: MAC  Patient location: PACU  Post pain: Pain level controlled  Post assessment: Patient's Cardiovascular Status Stable  Last Vitals:  Filed Vitals:   10/09/12 1800  BP: 98/77  Pulse: 74  Temp:   Resp:     Post vital signs: Reviewed and stable  Level of consciousness: alert  Complications: No apparent anesthesia complications

## 2012-10-11 LAB — URINE CULTURE

## 2012-11-17 ENCOUNTER — Encounter: Payer: Self-pay | Admitting: Internal Medicine

## 2012-11-17 ENCOUNTER — Ambulatory Visit (INDEPENDENT_AMBULATORY_CARE_PROVIDER_SITE_OTHER): Payer: Medicare PPO | Admitting: Internal Medicine

## 2012-11-17 VITALS — BP 114/71 | HR 64 | Ht 63.0 in | Wt 144.0 lb

## 2012-11-17 DIAGNOSIS — I4892 Unspecified atrial flutter: Secondary | ICD-10-CM

## 2012-11-17 NOTE — Progress Notes (Signed)
PCP: Haywood Pao, MD Primary Cardiologist:  Dr Acie Fredrickson  Shannon [name] is a 79 y.o. female who presents today for routine electrophysiology followup.  Since her recent cardioversion for atypical atrial flutter, the patient reports doing very well.  Today, she denies symptoms of palpitations, chest pain, shortness of breath,  lower extremity edema, dizziness, presyncope, or syncope.  The patient is otherwise without complaint today.   Past Medical History  Diagnosis Date  . CAD (coronary artery disease)     a. 05/2006 Cath/PCI: LAD 84m, D1 40 ost, LCX nl, OM1 95 (3.0x20 Taxus DES), RCA nl, EF 40% w/ lat AK;  04/2007 Ex MV: minimal lat ischemia in area of prior infarct->low risk-> med Rx.  [name] Hyperlipidemia   . Diabetes mellitus   . MVP (mitral valve prolapse)     a. 10/2003 Echo: nl LV fxn, mild MR/TR/AS  . Dysrhythmia     atrial fib& flutter  . Myocardial infarction   . Sleep apnea   . Transient ischemic attack   . Arthritis    Past Surgical History  Procedure Laterality Date  . Tonsillectomy    . Coronary angioplasty      Status post PTCA and stenting of the first obtuse marginal-05/13/2006. We placed a 3.0 x 20 mm Taxus stent. It was post dilated using a 3.25 mm noncompliant balloon up to 14 atmospheres  . Cataracts      Current Outpatient Prescriptions  Medication Sig Dispense Refill  . atorvastatin (LIPITOR) 20 MG tablet Take 20 mg by mouth daily.      . dabigatran (PRADAXA) 150 MG CAPS Take 1 capsule (150 mg total) by mouth every 12 (twelve) hours.  60 capsule  3  . fish oil-omega-3 fatty acids 1000 MG capsule Take 1 g by mouth daily.       . metFORMIN (GLUMETZA) 500 MG (MOD) 24 hr tablet Take 1 tablet (500 mg total) by mouth daily.      . metoprolol succinate (TOPROL-XL) 50 MG 24 hr tablet Take 50 mg by mouth daily. Take with or immediately following a meal.      . nitroGLYCERIN (NITROSTAT) 0.4 MG SL tablet Place 0.4 mg under the tongue every 5 (five) minutes x 3 doses  as needed for chest pain. Up to 3 doses, if pain continues, call 911.      [name] VITAMIN D, CHOLECALCIFEROL, PO Take 2,000 Units by mouth.        No current facility-administered medications for this visit.    Physical Exam: Filed Vitals:   11/17/12 1605  BP: 114/71  Pulse: 64  Height: 5\' 3"  (1.6 m)  Weight: 144 lb (65.318 kg)    GEN- The patient is well appearing, alert and oriented x 3 today.   Head- normocephalic, atraumatic Eyes-  Sclera clear, conjunctiva pink Ears- hearing intact Oropharynx- clear Lungs- Clear to ausculation bilaterally, normal work of breathing Heart- Regular rate and rhythm, no murmurs, rubs or gallops, PMI not laterally displaced GI- soft, NT, ND, + BS Extremities- no clubbing, cyanosis, or edema  ekg today reveals sinus rhythm  Assessment and Plan:  1. Atypical atrial flutter Maintaining sinus rhythm If atrial flutter returns, would recommend AAD therapy Continue anticoagulation at this time  She will follow with Dr Acie Fredrickson I will see as needed

## 2012-11-17 NOTE — Patient Instructions (Signed)
Your physician recommends that you schedule a follow-up appointment as needed  

## 2013-01-15 ENCOUNTER — Other Ambulatory Visit: Payer: Self-pay | Admitting: Cardiovascular Disease

## 2013-01-24 ENCOUNTER — Telehealth: Payer: Self-pay | Admitting: Physician Assistant

## 2013-01-24 NOTE — Telephone Encounter (Signed)
Patient reported experiencing sharp, recurrent CP today on 2 separate occasions, the 1st at approximately 6:30 AM and the 2nd around noon. She describes sudden onset of the pain, which would last only a few seconds, but which would recur. She denied any radiation or associated dyspnea, diaphoresis, or nausea. On both occasions she took 1 NTG tablet, which seemed to prevent a recurrence for several hours. She now has not had any further CP for 5 hours. Of note, she also reports experiencing quite a bit of gas, after recently being placed on Pradaxa. Although she has experienced MIs in the remote past, she states that those episodes were quite different from today's experience. I advised her to immediately contact EMS if she has a third episode today, and to then proceed directly to the ED. On the other hand, if she does not have any recurrent CP, I asked her to contact me tomorrow for an update. If she continues to remain stable, I recommended that she be seen in our office early next week, for an evaluation and further recommendations. She was quite appreciative of the call back, and agreeable with this recommendation.

## 2013-02-19 ENCOUNTER — Telehealth: Payer: Self-pay | Admitting: Internal Medicine

## 2013-02-19 ENCOUNTER — Other Ambulatory Visit: Payer: Self-pay | Admitting: Physician Assistant

## 2013-02-19 MED ORDER — DABIGATRAN ETEXILATE MESYLATE 150 MG PO CAPS: 150.0000 mg | ORAL_CAPSULE | Freq: Two times a day (BID) | ORAL | Status: DC

## 2013-02-19 NOTE — Telephone Encounter (Signed)
Sent in her new Rx for Pradaxa

## 2013-02-19 NOTE — Telephone Encounter (Signed)
New problem   Pt need to know what mg of Pradaxa she need to be on . Please call pt

## 2013-04-19 ENCOUNTER — Other Ambulatory Visit: Payer: Self-pay | Admitting: Cardiovascular Disease

## 2013-05-26 ENCOUNTER — Telehealth: Payer: Self-pay | Admitting: Internal Medicine

## 2013-05-26 NOTE — Telephone Encounter (Signed)
New message    C/o feet and legs swelling. Pt has gained 3lbs.  Need advise on what to do.

## 2013-05-26 NOTE — Telephone Encounter (Signed)
Wt up 4 lbs. Pt weighed today after lunch and with clothes on. She usually gets wt in am in just her underwear. Diet reviewed and she has been eating high sodium foods. Denies sob, just puffy ankles today. Sodium foods to avoid were reviewed. Pt will reweigh in am and will call if wt and edema is still a problem. Pt agreed to plan of care. Pt was made an app/ has not been seen in over a year.

## 2013-06-03 ENCOUNTER — Encounter: Payer: Self-pay | Admitting: Cardiovascular Disease

## 2013-07-09 ENCOUNTER — Ambulatory Visit: Payer: Medicare PPO | Admitting: Cardiovascular Disease

## 2013-07-19 ENCOUNTER — Other Ambulatory Visit: Payer: Self-pay | Admitting: Cardiovascular Disease

## 2013-08-13 ENCOUNTER — Encounter: Payer: Self-pay | Admitting: Cardiovascular Disease

## 2013-08-13 ENCOUNTER — Encounter (INDEPENDENT_AMBULATORY_CARE_PROVIDER_SITE_OTHER): Payer: Self-pay

## 2013-08-13 ENCOUNTER — Ambulatory Visit (INDEPENDENT_AMBULATORY_CARE_PROVIDER_SITE_OTHER): Payer: Medicare PPO | Admitting: Cardiovascular Disease

## 2013-08-13 VITALS — BP 110/70 | HR 54 | Ht 63.0 in | Wt 139.8 lb

## 2013-08-13 DIAGNOSIS — I4892 Unspecified atrial flutter: Secondary | ICD-10-CM

## 2013-08-13 DIAGNOSIS — I2510 Atherosclerotic heart disease of native coronary artery without angina pectoris: Secondary | ICD-10-CM

## 2013-08-13 NOTE — Assessment & Plan Note (Signed)
She has A- fib and A-flutter .  Was not able to be ablated but has maintained NSR after cardioversion.   She would like to have a blood thinner that she has to take it only once a day. We talked about Xarelto.    She will check with her pharmacy to see what the price of Xarelto is.  For now we'll continue with her current Pradaxa.

## 2013-08-13 NOTE — Assessment & Plan Note (Signed)
Shannon [name] is doing well.  No angina.  She does not have the energy that she used to. Continue current meds.

## 2013-08-13 NOTE — Patient Instructions (Addendum)
Ask your pharmacy about the cost of  Xarelto 20 mg a day instead of Pradaxa  Your physician wants you to follow-up in: Shannon [name] will receive a reminder letter in the mail two months in advance. If you don't receive a letter, please call our office to schedule the follow-up appointment.   Your physician recommends that you return for lab work in: [place]

## 2013-08-13 NOTE — Progress Notes (Signed)
[name] Date of Birth  Apr 14, 1932       [facility]    [employer] [address], [address], [place], [place]  [address]   [place], [place]  [address] [phone]     [phone]   Fax  [phone]    Fax [phone]  Problem List: 1. Coronary artery disease: Status post PTCA and stenting of the first obtuse marginal-05/13/2006. We placed a 3.0 x 20 mm Taxus stent. It was post dilated using a 3.25 mm noncompliant balloon up to 14 atmospheres 2. Diabetes mellitus 3. Hyperlipidemia  History of Present Illness: Rozena is an 80 y.o. female with a hx of CAD. She has some occasional episodes of chest pressure typically when she wakes up in the morning. She also wakes up with a severe headache. She's been worked up for sleep apnea. She works out in the yard on a fairly regular basis and does not have any chest pain or chest tightness with exercise.    She is active all day long.  She gets tired but never has any chest pain.  Feb. 12, 2015:  Emiley is doing well.  She was in the hosptial recently with Atrial fib and atrial flutter.  She had an EP study but was not able to be ablated.  She was cardioverted and has maintained NSR.   She is able to do all of her normal activities.   She is tolerating her pradaxa .  Sometimes she forget  Current Outpatient Prescriptions on File Prior to Visit  Medication Sig Dispense Refill  . atorvastatin (LIPITOR) 20 MG tablet TAKE ONE TABLET BY MOUTH ONE TIME DAILY  90 tablet  3  . dabigatran (PRADAXA) 150 MG CAPS capsule Take 1 capsule (150 mg total) by mouth every 12 (twelve) hours.  60 capsule  11  . fish oil-omega-3 fatty acids 1000 MG capsule Take 1 g by mouth daily.       . metFORMIN (GLUMETZA) 500 MG (MOD) 24 hr tablet Take 1 tablet (500 mg total) by mouth daily.      . metoprolol succinate (TOPROL-XL) 50 MG 24 hr tablet TAKE ONE TABLET BY MOUTH ONE TIME DAILY   90 tablet  1  .  nitroGLYCERIN (NITROSTAT) 0.4 MG SL tablet Place 0.4 mg under the tongue every 5 (five) minutes x 3 doses as needed for chest pain. Up to 3 doses, if pain continues, call 911.      [name] VITAMIN D, CHOLECALCIFEROL, PO Take 2,000 Units by mouth.        No current facility-administered medications on file prior to visit.    Allergies  Allergen Reactions  . Demerol Other (See Comments)    Burning sensation all over body  . Erythromycin Nausea And Vomiting  . Penicillins Other (See Comments)    unknown  . Percocet [Oxycodone-Acetaminophen] Swelling    Past Medical History  Diagnosis Date  . CAD (coronary artery disease)     a. 05/2006 Cath/PCI: LAD 55m, D1 40 ost, LCX nl, OM1 95 (3.0x20 Taxus DES), RCA nl, EF 40% w/ lat AK;  04/2007 Ex MV: minimal lat ischemia in area of prior infarct->low risk-> med Rx.  [name] Hyperlipidemia   . Diabetes mellitus   . MVP (mitral valve prolapse)     a. 10/2003 Echo: nl LV fxn, mild MR/TR/AS  . Dysrhythmia     atrial fib& flutter  . Myocardial infarction   . Sleep apnea   .  Transient ischemic attack   . Arthritis     Past Surgical History  Procedure Laterality Date  . Tonsillectomy    . Coronary angioplasty      Status post PTCA and stenting of the first obtuse marginal-05/13/2006. We placed a 3.0 x 20 mm Taxus stent. It was post dilated using a 3.25 mm noncompliant balloon up to 14 atmospheres  . Cataracts      History  Smoking status  . Former Smoker -- 1.00 packs/day  . Types: Cigarettes  . Quit date: 07/03/1963  Smokeless tobacco  . Never Used    History  Alcohol Use No    Family History  Problem Relation Age of Onset  . Heart attack Father   . Heart attack Mother   . Heart attack Brother   . Coronary artery disease Sister     CABG  . Alzheimer's disease Sister     X3    Reviw of Systems:  Reviewed in the HPI.  All other systems are negative.  Physical Exam: Blood pressure 110/70, pulse 54, height 5\' 3"  (1.6 m), weight 139 lb  12.8 oz (63.413 kg). General: Well developed, well nourished, in no acute distress.  Head: Normocephalic, atraumatic, sclera non-icteric, mucus membranes are moist,   Neck: Supple. Carotids are 2 + without bruits. No JVD  Lungs: Clear bilaterally to auscultation.  Heart: regular rate.  normal  S1 S2. No murmurs, gallops or rubs.  Abdomen: Soft, non-tender, non-distended with normal bowel sounds. No hepatomegaly. No rebound/guarding. No masses.  Msk:  Strength and tone are normal  Extremities: No clubbing or cyanosis. No edema.  Distal pedal pulses are 2+ and equal bilaterally.  Neuro: Alert and oriented X 3. Moves all extremities spontaneously.  Psych:  Responds to questions appropriately with a normal affect.  ECG: Feb. 12, 2015:  Sinus brady at 54.  No ST or T wave changes.  Assessment / Plan:

## 2014-01-10 ENCOUNTER — Other Ambulatory Visit: Payer: Self-pay | Admitting: Cardiovascular Disease

## 2014-01-19 ENCOUNTER — Other Ambulatory Visit: Payer: Self-pay | Admitting: Cardiovascular Disease

## 2014-02-15 ENCOUNTER — Ambulatory Visit (INDEPENDENT_AMBULATORY_CARE_PROVIDER_SITE_OTHER): Payer: Medicare PPO | Admitting: Cardiovascular Disease

## 2014-02-15 ENCOUNTER — Encounter: Payer: Self-pay | Admitting: Cardiovascular Disease

## 2014-02-15 VITALS — BP 106/64 | HR 72 | Ht 63.0 in | Wt 140.1 lb

## 2014-02-15 DIAGNOSIS — I2510 Atherosclerotic heart disease of native coronary artery without angina pectoris: Secondary | ICD-10-CM

## 2014-02-15 DIAGNOSIS — I4892 Unspecified atrial flutter: Secondary | ICD-10-CM

## 2014-02-15 DIAGNOSIS — I483 Typical atrial flutter: Secondary | ICD-10-CM

## 2014-02-15 NOTE — Assessment & Plan Note (Signed)
She's had a history of atrial fibrillation/atrial flutter in the past. She has continued on the Pradaxa  She's not had any significant arrhythmias.

## 2014-02-15 NOTE — Patient Instructions (Signed)
Your physician recommends that you continue on your current medications as directed. Please refer to the Current Medication list given to you today.  Your physician wants you to follow-up in: 6 months with Dr. Nahser.  You will receive a reminder letter in the mail two months in advance. If you don't receive a letter, please call our office to schedule the follow-up appointment.  

## 2014-02-15 NOTE — Progress Notes (Signed)
Shannon [name] Date of Birth  [birth year]       [facility]    [employer] [address], [address], [place], [place]  [address]   [place], [place]  [address] [phone]     [phone]   Fax  [phone]    Fax [phone]  Problem List: 1. Coronary artery disease: Status post PTCA and stenting of the first obtuse marginal-05/13/2006. We placed a 3.0 x 20 mm Taxus stent. It was post dilated using a 3.25 mm noncompliant balloon up to 14 atmospheres 2. Diabetes mellitus 3. Hyperlipidemia  History of Present Illness: Shannon [name] is an 80 y.o. female with a hx of CAD. She has some occasional episodes of chest pressure typically when she wakes up in the morning. She also wakes up with a severe headache. She's been worked up for sleep apnea. She works out in the yard on a fairly regular basis and does not have any chest pain or chest tightness with exercise.    She is active all day long.  She gets tired but never has any chest pain.  Feb. 12, 2015:  Shannon [name] is doing well.  She was in the hosptial recently with Atrial fib and atrial flutter.  She had an EP study but was not able to be ablated.  She was cardioverted and has maintained NSR.   She is able to do all of her normal activities.   She is tolerating her pradaxa .  Sometimes she forgets  February 15, 2014:  Shannon [name] is doing well.  No CP. She had had some bloody mucus and had some sinus problems.  Likely due to the pradaxa.  No significant  arrhthymias   Current Outpatient Prescriptions on File Prior to Visit  Medication Sig Dispense Refill  . atorvastatin (LIPITOR) 20 MG tablet TAKE ONE TABLET BY MOUTH ONE TIME DAILY   90 tablet  0  . dabigatran (PRADAXA) 150 MG CAPS capsule Take 1 capsule (150 mg total) by mouth every 12 (twelve) hours.  60 capsule  11  . fish oil-omega-3 fatty acids 1000 MG capsule Take 1 g by mouth daily.       . metFORMIN (GLUMETZA) 500 MG (MOD) 24 hr tablet  Take 1 tablet (500 mg total) by mouth daily.      . metoprolol succinate (TOPROL-XL) 50 MG 24 hr tablet TAKE ONE TABLET BY MOUTH ONE TIME DAILY   90 tablet  3  . nitroGLYCERIN (NITROSTAT) 0.4 MG SL tablet Place 0.4 mg under the tongue every 5 (five) minutes x 3 doses as needed for chest pain. Up to 3 doses, if pain continues, call 911.      [name] ONE TOUCH ULTRA TEST test strip       . VITAMIN D, CHOLECALCIFEROL, PO Take 2,000 Units by mouth.        No current facility-administered medications on file prior to visit.    Allergies  Allergen Reactions  . Demerol Other (See Comments)    Burning sensation all over body  . Erythromycin Nausea And Vomiting  . Penicillins Other (See Comments)    unknown  . Percocet [Oxycodone-Acetaminophen] Swelling    Past Medical History  Diagnosis Date  . CAD (coronary artery disease)     a. 05/2006 Cath/PCI: LAD 18m, D1 40 ost, LCX nl, OM1 95 (3.0x20 Taxus DES), RCA nl, EF 40% w/ lat AK;  04/2007 Ex MV: minimal lat ischemia in area of prior infarct->low risk->  med Rx.  [name] Hyperlipidemia   . Diabetes mellitus   . MVP (mitral valve prolapse)     a. 10/2003 Echo: nl LV fxn, mild MR/TR/AS  . Dysrhythmia     atrial fib& flutter  . Myocardial infarction   . Sleep apnea   . Transient ischemic attack   . Arthritis     Past Surgical History  Procedure Laterality Date  . Tonsillectomy    . Coronary angioplasty      Status post PTCA and stenting of the first obtuse marginal-05/13/2006. We placed a 3.0 x 20 mm Taxus stent. It was post dilated using a 3.25 mm noncompliant balloon up to 14 atmospheres  . Cataracts      History  Smoking status  . Former Smoker -- 1.00 packs/day  . Types: Cigarettes  . Quit date: 07/03/1963  Smokeless tobacco  . Never Used    History  Alcohol Use No    Family History  Problem Relation Age of Onset  . Heart attack Father   . Heart attack Mother   . Heart attack Brother   . Coronary artery disease Sister     CABG    . Alzheimer's disease Sister     X3    Reviw of Systems:  Reviewed in the HPI.  All other systems are negative.  Physical Exam: Blood pressure 106/64, pulse 72, height 5\' 3"  (1.6 m), weight 140 lb 1.9 oz (63.558 kg), SpO2 99.00%. General: Well developed, well nourished, in no acute distress. Head: Normocephalic, atraumatic, sclera non-icteric, mucus membranes are moist,  Neck: Supple. Carotids are 2 + without bruits. No JVD Lungs: Clear bilaterally to auscultation. Heart: regular rate.  normal  S1 S2. No murmurs, gallops or rubs. Abdomen: Soft, non-tender, non-distended with normal bowel sounds. No hepatomegaly. No rebound/guarding. No masses. Msk:  Strength and tone are normal Extremities: No clubbing or cyanosis. No edema.  Distal pedal pulses are 2+ and equal bilaterally. Neuro: Alert and oriented X 3. Moves all extremities spontaneously. Psych:  Responds to questions appropriately with a normal affect.  ECG: Feb. 12, 2015:  Sinus brady at 54.  No ST or T wave changes.  Assessment / Plan:

## 2014-02-15 NOTE — Assessment & Plan Note (Signed)
Shannon [name] is doing well. She's not having episodes of chest discomfort. We will continue with her same medications. Her medical Dr. is managing her cholesterol levels.  I'll see her again in 6 months for followup visit.

## 2014-02-17 ENCOUNTER — Other Ambulatory Visit: Payer: Self-pay | Admitting: Internal Medicine

## 2014-04-08 ENCOUNTER — Other Ambulatory Visit: Payer: Self-pay | Admitting: Cardiovascular Disease

## 2014-06-10 ENCOUNTER — Encounter (HOSPITAL_COMMUNITY): Payer: Self-pay | Admitting: Internal Medicine

## 2014-06-22 ENCOUNTER — Telehealth: Payer: Self-pay | Admitting: Internal Medicine

## 2014-06-22 NOTE — Telephone Encounter (Signed)
Walk in pt form " [employer] pt assistance program" form dropped off gave to [employer]

## 2014-06-30 ENCOUNTER — Telehealth: Payer: Self-pay | Admitting: *Deleted

## 2014-06-30 NOTE — Telephone Encounter (Signed)
Patient Assistance Program form faxed to [place] for Pradaxa.

## 2014-07-20 ENCOUNTER — Other Ambulatory Visit: Payer: Self-pay | Admitting: Internal Medicine

## 2014-09-20 ENCOUNTER — Ambulatory Visit (INDEPENDENT_AMBULATORY_CARE_PROVIDER_SITE_OTHER): Payer: Commercial Managed Care - HMO | Admitting: Cardiovascular Disease

## 2014-09-20 ENCOUNTER — Encounter: Payer: Self-pay | Admitting: Cardiovascular Disease

## 2014-09-20 VITALS — BP 100/60 | HR 68 | Ht 63.0 in | Wt 145.6 lb

## 2014-09-20 DIAGNOSIS — E785 Hyperlipidemia, unspecified: Secondary | ICD-10-CM | POA: Diagnosis not present

## 2014-09-20 DIAGNOSIS — I2510 Atherosclerotic heart disease of native coronary artery without angina pectoris: Secondary | ICD-10-CM

## 2014-09-20 NOTE — Progress Notes (Signed)
Cardiology Office Note   Date:  09/20/2014   ID:  MUSIQ BARAL, DOB 30-Sep-1931, MRN [id number]  PCP:  Haywood Pao, MD  Cardiologist:   Thayer Headings, MD   Chief Complaint  Patient presents with  . Coronary Artery Disease   1. Coronary artery disease: Status post PTCA and stenting of the first obtuse marginal-05/13/2006. We placed a 3.0 x 20 mm Taxus stent. It was post dilated using a 3.25 mm noncompliant balloon up to 14 atmospheres 2. Diabetes mellitus 3. Hyperlipidemia 4. Paroxysmal Atrial fib  History of Present Illness: Shannon [name] is an 81 y.o. female with a hx of CAD. She has some occasional episodes of chest pressure typically when she wakes up in the morning. She also wakes up with a severe headache. She's been worked up for sleep apnea. She works out in the yard on a fairly regular basis and does not have any chest pain or chest tightness with exercise.   She is active all day long. She gets tired but never has any chest pain.  Feb. 12, 2015:  Shannon [name] is doing well. She was in the hosptial recently with Atrial fib and atrial flutter. She had an EP study but was not able to be ablated. She was cardioverted and has maintained NSR. She is able to do all of her normal activities. She is tolerating her pradaxa . Sometimes she forgets  February 15, 2014:  Shannon [name] is doing well. No CP. She had had some bloody mucus and had some sinus problems. Likely due to the pradaxa. No significant arrhthymias    September 20, 2014: Shannon [name] is a 81 y.o. female who presents for follow up of her CAD She is upset about her husband, Shannon [name]. He fell last August and has been bedridden since that time.  Has not been able to get him to his pacer appts.   No CP or dyspnea.  She has occasional nose bleeds  - likely due to the Pradaxa    Past Medical History  Diagnosis Date  . CAD (coronary artery disease)     a. 05/2006 Cath/PCI: LAD 48m, D1 40 ost, LCX  nl, OM1 95 (3.0x20 Taxus DES), RCA nl, EF 40% w/ lat AK;  04/2007 Ex MV: minimal lat ischemia in area of prior infarct->low risk-> med Rx.  Shannon [name] Hyperlipidemia   . Diabetes mellitus   . MVP (mitral valve prolapse)     a. 10/2003 Echo: nl LV fxn, mild MR/TR/AS  . Dysrhythmia     atrial fib& flutter  . Myocardial infarction   . Sleep apnea   . Transient ischemic attack   . Arthritis     Past Surgical History  Procedure Laterality Date  . Tonsillectomy    . Coronary angioplasty      Status post PTCA and stenting of the first obtuse marginal-05/13/2006. We placed a 3.0 x 20 mm Taxus stent. It was post dilated using a 3.25 mm noncompliant balloon up to 14 atmospheres  . Cataracts    . Atrial flutter ablation N/A 10/09/2012    Procedure: ATRIAL FLUTTER ABLATION;  Surgeon: Thompson Grayer, MD;  Location: [facility] CATH LAB;  Service: Cardiovascular;  Laterality: N/A;     Current Outpatient Prescriptions  Medication Sig Dispense Refill  . fish oil-omega-3 fatty acids 1000 MG capsule Take 1 g by mouth daily.     . metFORMIN (GLUMETZA) 500 MG (MOD) 24 hr tablet Take 1 tablet (500 mg total) by mouth daily.    Shannon [name]  metoprolol succinate (TOPROL-XL) 50 MG 24 hr tablet TAKE ONE TABLET BY MOUTH ONE TIME DAILY  90 tablet 3  . nitroGLYCERIN (NITROSTAT) 0.4 MG SL tablet Place 0.4 mg under the tongue every 5 (five) minutes x 3 doses as needed for chest pain. Up to 3 doses, if pain continues, call 911.    Shannon [name] ONE TOUCH ULTRA TEST test strip     . VITAMIN D, CHOLECALCIFEROL, PO Take 2,000 Units by mouth.     Shannon [name] atorvastatin (LIPITOR) 20 MG tablet TAKE ONE TABLET BY MOUTH ONE TIME DAILY  90 tablet 3  . PRADAXA 150 MG CAPS capsule TAKE ONE CAPSULE BY MOUTH EVERY TWELVE HOURS  60 capsule 10   No current facility-administered medications for this visit.    Allergies:   Demerol; Erythromycin; Penicillins; and Percocet    Social History:  The patient  reports that she quit smoking about 51 years ago. Her smoking use  included Cigarettes. She smoked 1.00 pack per day. She has never used smokeless tobacco. She reports that she does not drink alcohol or use illicit drugs.   Family History:  The patient's family history includes Alzheimer's disease in her sister; Coronary artery disease in her sister; Heart attack in her brother, father, and mother.    ROS:  Please see the history of present illness.    Review of Systems: Constitutional:  denies fever, chills, diaphoresis, appetite change and fatigue.  HEENT: denies photophobia, eye pain, redness, hearing loss, ear pain, congestion, sore throat, rhinorrhea, sneezing, neck pain, neck stiffness and tinnitus.  Respiratory: denies SOB, DOE, cough, chest tightness, and wheezing.  Cardiovascular: denies chest pain, palpitations and leg swelling.  Gastrointestinal: denies nausea, vomiting, abdominal pain, diarrhea, constipation, blood in stool.  Genitourinary: denies dysuria, urgency, frequency, hematuria, flank pain and difficulty urinating.  Musculoskeletal: denies  myalgias, back pain, joint swelling, arthralgias and gait problem.   Skin: denies pallor, rash and wound.  Neurological: denies dizziness, seizures, syncope, weakness, light-headedness, numbness and headaches.   Hematological: denies adenopathy, easy bruising, personal or family bleeding history.  Psychiatric/ Behavioral: denies suicidal ideation, mood changes, confusion, nervousness, sleep disturbance and agitation.       All other systems are reviewed and negative.    PHYSICAL EXAM: VS:  There were no vitals taken for this visit. , BMI There is no weight on file to calculate BMI. GEN: Well nourished, well developed, in no acute distress HEENT: normal Neck: no JVD, carotid bruits, or masses Cardiac: RRR; systolic  murmurs, rubs, or gallops, 1-2+  edema  Respiratory:  clear to auscultation bilaterally, normal work of breathing GI: soft, nontender, nondistended, + BS MS: no deformity or  atrophy Skin: warm and dry, no rash Neuro:  Strength and sensation are intact Psych: normal   EKG:  EKG is ordered today. The ekg ordered today demonstrates NSR with occasional PVCs.    Recent Labs: No results found for requested labs within last 365 days.    Lipid Panel    Component Value Date/Time   CHOL 117 10/08/2012 0145   TRIG 106 10/08/2012 0145   HDL 54 10/08/2012 0145   CHOLHDL 2.2 10/08/2012 0145   VLDL 21 10/08/2012 0145   LDLCALC 42 10/08/2012 0145      Wt Readings from Last 3 Encounters:  02/15/14 140 lb 1.9 oz (63.558 kg)  08/13/13 139 lb 12.8 oz (63.413 kg)  11/17/12 144 lb (65.318 kg)      Other studies Reviewed: Additional studies/ records that were reviewed  today include: . Review of the above records demonstrates:    ASSESSMENT AND PLAN:  1. Coronary artery disease: Status post PTCA and stenting of the first obtuse marginal-05/13/2006. We placed a 3.0 x 20 mm Taxus stent. It was post dilated using a 3.25 mm noncompliant balloon up to 14 atmospheres 2. Diabetes mellitus  - followed by her general medical doctor.  3. Hyperlipidemia- will check labs today   4. Paroxysmal Atrial fib- she has not had any further episodes of atrial fib recently. Has had rare episodes of nose bleeds - not much.  May consider switching to Eliquis if she continues to have nose bleeding - although I'm not sure that it will help much.     Current medicines are reviewed at length with the patient today.  The patient does not have concerns regarding medicines.  The following changes have been made:  no change  Labs/ tests ordered today include:  No orders of the defined types were placed in this encounter.     Disposition:   FU with me in 1 year     Signed, Chilton Sallade, Wonda Cheng, MD  09/20/2014 5:47 AM    [address], [place],   [address] Phone: [phone]; Fax: [phone]

## 2014-09-20 NOTE — Patient Instructions (Signed)
Your physician recommends that you continue on your current medications as directed. Please refer to the Current Medication list given to you today.  Your physician recommends that you have lab work:  TODAY - cholesterol, liver, basic metabolic panel  Your physician wants you to follow-up in: 1 year with Dr. Acie Fredrickson.  You will receive a reminder letter in the mail two months in advance. If you don't receive a letter, please call our office to schedule the follow-up appointment.

## 2014-09-21 LAB — HEPATIC FUNCTION PANEL
ALBUMIN: 3.8 g/dL (ref 3.5–5.2)
ALT: 14 U/L (ref 0–35)
AST: 20 U/L (ref 0–37)
Alkaline Phosphatase: 75 U/L (ref 39–117)
BILIRUBIN TOTAL: 0.5 mg/dL (ref 0.2–1.2)
Bilirubin, Direct: 0.1 mg/dL (ref 0.0–0.3)
Total Protein: 6.5 g/dL (ref 6.0–8.3)

## 2014-09-21 LAB — BASIC METABOLIC PANEL
BUN: 15 mg/dL (ref 6–23)
CO2: 29 meq/L (ref 19–32)
CREATININE: 0.83 mg/dL (ref 0.40–1.20)
Calcium: 9.2 mg/dL (ref 8.4–10.5)
Chloride: 105 mEq/L (ref 96–112)
GFR: 70.13 mL/min (ref >60.00)
Glucose, Bld: 176 mg/dL — ABNORMAL HIGH (ref 70–99)
Potassium: 4.1 mEq/L (ref 3.5–5.1)
Sodium: 139 mEq/L (ref 135–145)

## 2014-09-21 LAB — LIPID PANEL
CHOLESTEROL: 115 mg/dL (ref 0–200)
HDL: 66.7 mg/dL (ref >39.00)
LDL CALC: 27 mg/dL (ref 0–99)
NonHDL: 48.3
Total CHOL/HDL Ratio: 2
Triglycerides: 109 mg/dL (ref 0.0–149.0)
VLDL: 21.8 mg/dL (ref 0.0–40.0)

## 2014-12-03 DIAGNOSIS — R8290 Unspecified abnormal findings in urine: Secondary | ICD-10-CM | POA: Diagnosis not present

## 2014-12-03 DIAGNOSIS — E1151 Type 2 diabetes mellitus with diabetic peripheral angiopathy without gangrene: Secondary | ICD-10-CM | POA: Diagnosis not present

## 2014-12-03 DIAGNOSIS — I2510 Atherosclerotic heart disease of native coronary artery without angina pectoris: Secondary | ICD-10-CM | POA: Diagnosis not present

## 2014-12-03 DIAGNOSIS — E785 Hyperlipidemia, unspecified: Secondary | ICD-10-CM | POA: Diagnosis not present

## 2014-12-03 DIAGNOSIS — M859 Disorder of bone density and structure, unspecified: Secondary | ICD-10-CM | POA: Diagnosis not present

## 2014-12-03 DIAGNOSIS — N390 Urinary tract infection, site not specified: Secondary | ICD-10-CM | POA: Diagnosis not present

## 2014-12-10 DIAGNOSIS — Z955 Presence of coronary angioplasty implant and graft: Secondary | ICD-10-CM | POA: Diagnosis not present

## 2014-12-10 DIAGNOSIS — M8580 Other specified disorders of bone density and structure, unspecified site: Secondary | ICD-10-CM | POA: Diagnosis not present

## 2014-12-10 DIAGNOSIS — I480 Paroxysmal atrial fibrillation: Secondary | ICD-10-CM | POA: Diagnosis not present

## 2014-12-10 DIAGNOSIS — E1151 Type 2 diabetes mellitus with diabetic peripheral angiopathy without gangrene: Secondary | ICD-10-CM | POA: Diagnosis not present

## 2014-12-10 DIAGNOSIS — M19012 Primary osteoarthritis, left shoulder: Secondary | ICD-10-CM | POA: Diagnosis not present

## 2014-12-10 DIAGNOSIS — D638 Anemia in other chronic diseases classified elsewhere: Secondary | ICD-10-CM | POA: Diagnosis not present

## 2014-12-10 DIAGNOSIS — E559 Vitamin D deficiency, unspecified: Secondary | ICD-10-CM | POA: Diagnosis not present

## 2014-12-10 DIAGNOSIS — Z0000 Encounter for general adult medical examination without abnormal findings: Secondary | ICD-10-CM | POA: Diagnosis not present

## 2015-01-09 ENCOUNTER — Emergency Department (HOSPITAL_COMMUNITY)
Admission: EM | Admit: 2015-01-09 | Discharge: 2015-01-10 | Disposition: A | Discharge status: Home / Self Care | Payer: Commercial Managed Care - HMO | Attending: Emergency Medicine | Admitting: Emergency Medicine

## 2015-01-09 ENCOUNTER — Encounter (HOSPITAL_COMMUNITY): Payer: Self-pay

## 2015-01-09 DIAGNOSIS — Z880 Allergy status to penicillin: Secondary | ICD-10-CM | POA: Diagnosis not present

## 2015-01-09 DIAGNOSIS — I252 Old myocardial infarction: Secondary | ICD-10-CM | POA: Diagnosis not present

## 2015-01-09 DIAGNOSIS — Z87891 Personal history of nicotine dependence: Secondary | ICD-10-CM | POA: Diagnosis not present

## 2015-01-09 DIAGNOSIS — I4891 Unspecified atrial fibrillation: Secondary | ICD-10-CM | POA: Insufficient documentation

## 2015-01-09 DIAGNOSIS — I2510 Atherosclerotic heart disease of native coronary artery without angina pectoris: Secondary | ICD-10-CM | POA: Diagnosis not present

## 2015-01-09 DIAGNOSIS — E785 Hyperlipidemia, unspecified: Secondary | ICD-10-CM | POA: Diagnosis not present

## 2015-01-09 DIAGNOSIS — Z79899 Other long term (current) drug therapy: Secondary | ICD-10-CM | POA: Diagnosis not present

## 2015-01-09 DIAGNOSIS — Z8669 Personal history of other diseases of the nervous system and sense organs: Secondary | ICD-10-CM | POA: Diagnosis not present

## 2015-01-09 DIAGNOSIS — E119 Type 2 diabetes mellitus without complications: Secondary | ICD-10-CM | POA: Diagnosis not present

## 2015-01-09 DIAGNOSIS — Z8673 Personal history of transient ischemic attack (TIA), and cerebral infarction without residual deficits: Secondary | ICD-10-CM | POA: Diagnosis not present

## 2015-01-09 LAB — BASIC METABOLIC PANEL
ANION GAP: 8 (ref 5–15)
BUN: 15 mg/dL (ref 6–20)
CALCIUM: 9.3 mg/dL (ref 8.9–10.3)
CHLORIDE: 101 mmol/L (ref 101–111)
CO2: 26 mmol/L (ref 22–32)
Creatinine, Ser: 0.75 mg/dL (ref 0.44–1.00)
GFR calc Af Amer: >60 mL/min (ref >60)
GFR calc non Af Amer: >60 mL/min (ref >60)
GLUCOSE: 134 mg/dL — AB (ref 65–99)
Potassium: 4.2 mmol/L (ref 3.5–5.1)
SODIUM: 135 mmol/L (ref 135–145)

## 2015-01-09 LAB — CBC
HEMATOCRIT: 38 % (ref 36.0–46.0)
HEMOGLOBIN: 12.8 g/dL (ref 12.0–15.0)
MCH: 33.1 pg (ref 26.0–34.0)
MCHC: 33.7 g/dL (ref 30.0–36.0)
MCV: 98.2 fL (ref 78.0–100.0)
Platelets: 181 10*3/uL (ref 150–400)
RBC: 3.87 MIL/uL (ref 3.87–5.11)
RDW: 14.4 % (ref 11.5–15.5)
WBC: 8.8 10*3/uL (ref 4.0–10.5)

## 2015-01-09 LAB — PROTIME-INR
INR: 2.1 — ABNORMAL HIGH (ref 0.00–1.49)
Prothrombin Time: 23.4 seconds — ABNORMAL HIGH (ref 11.6–15.2)

## 2015-01-09 LAB — I-STAT TROPONIN, ED: Troponin i, poc: 0.02 ng/mL (ref 0.00–0.08)

## 2015-01-09 MED ORDER — DILTIAZEM LOAD VIA INFUSION
15.0000 mg | Freq: Once | INTRAVENOUS | Status: AC
  Administered 2015-01-09: 15 mg via INTRAVENOUS

## 2015-01-09 MED ORDER — DILTIAZEM HCL 100 MG IV SOLR
5.0000 mg/h | INTRAVENOUS | Status: DC
  Administered 2015-01-09: 5 mg/h via INTRAVENOUS

## 2015-01-09 NOTE — ED Notes (Signed)
Pt sts she took a hydrocodone today before coming in (5-325) and pain is resolving in hip.

## 2015-01-09 NOTE — ED Notes (Signed)
MD at bedside. 

## 2015-01-09 NOTE — ED Provider Notes (Signed)
CSN: [id number]     Arrival date & time 01/09/15  2309 History  This chart was scribed for Shannon Schmidt, MD by Julien Nordmann, ED Scribe. This patient was seen in room A09C/A09C and the patient's care was started at 11:43 PM.     Chief Complaint  Patient presents with  . Leg Pain  . Atrial Fibrillation     The history is provided by the patient.   HPI Comments: Shannon [name] is a 81 y.o. female who has a PMHx of CAD, DM presents to the Emergency Department complaining of an increased pulse rate onset 3 hours ago. She states her pulse has been going from 83 to about 60. Pt reports feeling her pulse increased which made her check her rate. Pt also reports having left hip joint pain onset 3 weeks ago. She reports taking half of a hydrocodone to alleviate the pain with minimal relief recently. Pt denies shortness of breath, chest pain, chest pressure, nausea, vomiting. She has an appointment with her orthopedist on 7/21.  Past Medical History  Diagnosis Date  . CAD (coronary artery disease)     a. 05/2006 Cath/PCI: LAD 7m, D1 40 ost, LCX nl, OM1 95 (3.0x20 Taxus DES), RCA nl, EF 40% w/ lat AK;  04/2007 Ex MV: minimal lat ischemia in area of prior infarct->low risk-> med Rx.  [name] Hyperlipidemia   . Diabetes mellitus   . MVP (mitral valve prolapse)     a. 10/2003 Echo: nl LV fxn, mild MR/TR/AS  . Dysrhythmia     atrial fib& flutter  . Myocardial infarction   . Sleep apnea   . Transient ischemic attack   . Arthritis    Past Surgical History  Procedure Laterality Date  . Tonsillectomy    . Coronary angioplasty      Status post PTCA and stenting of the first obtuse marginal-05/13/2006. We placed a 3.0 x 20 mm Taxus stent. It was post dilated using a 3.25 mm noncompliant balloon up to 14 atmospheres  . Cataracts    . Atrial flutter ablation N/A 10/09/2012    Procedure: ATRIAL FLUTTER ABLATION;  Surgeon: Thompson Grayer, MD;  [facility]: [facility] C[facility] LAB;  Service: Cardiovascular;  Laterality:  N/A;   Family History  Problem Relation Age of Onset  . Heart attack Father   . Heart attack Mother   . Heart attack Brother   . Coronary artery disease Sister     CABG  . Alzheimer's disease Sister     X3   History  Substance Use Topics  . Smoking status: Former Smoker -- 1.00 packs/day    Types: Cigarettes    Quit date: 07/03/1963  . Smokeless tobacco: Never Used  . Alcohol Use: No   OB History    No data available     Review of Systems  A complete 10 system review of systems was obtained and all systems are negative except as noted in the HPI and PMH.    Allergies  Demerol; Erythromycin; Penicillins; and Percocet  Home Medications   Prior to Admission medications   Medication Sig Start Date End Date Taking? Authorizing Provider  atorvastatin (LIPITOR) 20 MG tablet TAKE ONE TABLET BY MOUTH ONE TIME DAILY  04/09/14   Thayer Headings, MD  fish oil-omega-3 fatty acids 1000 MG capsule Take 1 g by mouth daily.     Historical Provider, MD  metFORMIN (GLUMETZA) 500 MG (MOD) 24 hr tablet Take 1 tablet (500 mg total) by mouth daily. 10/09/12  09/20/14  Roger A Arguello, PA-C  metoprolol succinate (TOPROL-XL) 50 MG 24 hr tablet TAKE ONE TABLET BY MOUTH ONE TIME DAILY     Thayer Headings, MD  nitroGLYCERIN (NITROSTAT) 0.4 MG SL tablet Place 0.4 mg under the tongue every 5 (five) minutes x 3 doses as needed for chest pain. Up to 3 doses, if pain continues, call 911. 06/05/11   Thayer Headings, MD  ONE TOUCH ULTRA TEST test strip  08/03/13   Historical Provider, MD  PRADAXA 150 MG CAPS capsule TAKE ONE CAPSULE BY MOUTH EVERY TWELVE HOURS  02/18/14   Thompson Grayer, MD  VITAMIN D, CHOLECALCIFEROL, PO Take 2,000 Units by mouth.     Historical Provider, MD   Triage vitals: BP 125/78 mmHg  Pulse 151  Temp(Src) 97.5 F (36.4 C) (Oral)  Resp 18  Ht 5\' 2"  (1.575 m)  Wt 146 lb 8 oz (66.452 kg)  BMI 26.79 kg/m2  SpO2 98% Physical Exam  Constitutional: She is oriented to person, place, and  time. She appears well-developed and well-nourished. No distress.  HENT:  Head: Normocephalic and atraumatic.  Eyes: EOM are normal.  Neck: Normal range of motion.  Cardiovascular: Regular rhythm and normal heart sounds.  Tachycardia present.   Pulmonary/Chest: Effort normal and breath sounds normal.  Abdominal: Soft. She exhibits no distension. There is no tenderness.  Musculoskeletal: Normal range of motion.  Full ROM of left hip and left knee  Neurological: She is alert and oriented to person, place, and time.  Skin: Skin is warm and dry.  Psychiatric: She has a normal mood and affect. Judgment normal.  Nursing note and vitals reviewed.   ED Course  Procedures  DIAGNOSTIC STUDIES: Oxygen Saturation is 98% on RA, normal by my interpretation.  COORDINATION OF CARE:  11:48 PM Discussed treatment plan which includes cardizem with pt at bedside and pt agreed to plan.  Labs Review Labs Reviewed  BASIC METABOLIC PANEL  CBC  PROTIME-INR  TROPONIN I  MAGNESIUM  PHOSPHORUS  I-STAT [place], ED    Imaging Review No results found.  EKG Interpretation #1 Date/Time:  Sunday January 09 2015 23:14:59 EDT Ventricular Rate:  150 PR Interval:    QRS Duration: 98 QT Interval:  280 QTC Calculation: 442 R Axis:   -30 Text Interpretation:  Atrial fibrillation with rapid ventricular response  with premature ventricular or aberrantly conducted complexes Left axis  deviation Septal infarct , age undetermined Abnormal ECG rate increased  since prior ecg Confirmed by Kima Malenfant  MD, Lennette Bihari ([id number]) on 01/09/2015  11:26:01 PM  ECG interpretation #2  Date: 01/10/2015  Rate:   Rhythm: atrial fibrillation  QRS Axis: normal  Intervals: normal  ST/T Wave abnormalities: normal  Conduction Disutrbances: none  Narrative Interpretation:   Old EKG Reviewed: rate improvedNo significant changes noted  ECG interpretation #3  Date: 01/10/2015 0048am   Rate: 63  Rhythm: normal sinus rhythm  QRS  Axis: normal  Intervals: normal  ST/T Wave abnormalities: normal  Conduction Disutrbances: none  Narrative Interpretation:   Old EKG Reviewed: no longer in afib  ECG interpretation #4  Date: 01/10/2015  0105am  Rate: 64  Rhythm: normal sinus rhythm  QRS Axis: normal  Intervals: normal  ST/T Wave abnormalities: normal  Conduction Disutrbances: none  Narrative Interpretation:   Old EKG Reviewed: No significant changes noted compared to most recent ecg              MDM   Final diagnoses:  Atrial fibrillation  with rapid ventricular response    Patient converted to sinus rhythm with IV Cardizem.  Patient is now been off her IV Cardizem for presently one hour and remains in sinus rhythm.  Patient will be discharged home with primary care follow-up as well as cardiology follow-up.  She understands return to the ER for new or worsening symptoms.  No chest pain at this time.  Doubt ACS     I personally performed the services described in this documentation, which was scribed in my presence. The recorded information has been reviewed and is accurate.     Shannon Schmidt, MD 01/10/15 [phone]

## 2015-01-09 NOTE — ED Notes (Signed)
Pt reports leg pain made her  A fib flare up, having trouble sleeping. No help with hydrocodone

## 2015-01-09 NOTE — ED Notes (Signed)
Pt denies any SOB or CP at this time.

## 2015-01-10 ENCOUNTER — Other Ambulatory Visit: Payer: Self-pay | Admitting: Cardiovascular Disease

## 2015-01-10 DIAGNOSIS — E785 Hyperlipidemia, unspecified: Secondary | ICD-10-CM | POA: Diagnosis not present

## 2015-01-10 DIAGNOSIS — I252 Old myocardial infarction: Secondary | ICD-10-CM | POA: Diagnosis not present

## 2015-01-10 DIAGNOSIS — Z8673 Personal history of transient ischemic attack (TIA), and cerebral infarction without residual deficits: Secondary | ICD-10-CM | POA: Diagnosis not present

## 2015-01-10 DIAGNOSIS — Z8669 Personal history of other diseases of the nervous system and sense organs: Secondary | ICD-10-CM | POA: Diagnosis not present

## 2015-01-10 DIAGNOSIS — Z79899 Other long term (current) drug therapy: Secondary | ICD-10-CM | POA: Diagnosis not present

## 2015-01-10 DIAGNOSIS — I2510 Atherosclerotic heart disease of native coronary artery without angina pectoris: Secondary | ICD-10-CM | POA: Diagnosis not present

## 2015-01-10 DIAGNOSIS — I4891 Unspecified atrial fibrillation: Secondary | ICD-10-CM | POA: Diagnosis not present

## 2015-01-10 DIAGNOSIS — Z87891 Personal history of nicotine dependence: Secondary | ICD-10-CM | POA: Diagnosis not present

## 2015-01-10 DIAGNOSIS — E119 Type 2 diabetes mellitus without complications: Secondary | ICD-10-CM | POA: Diagnosis not present

## 2015-01-10 LAB — PHOSPHORUS: Phosphorus: 3.9 mg/dL (ref 2.5–4.6)

## 2015-01-10 LAB — MAGNESIUM: Magnesium: 1.9 mg/dL (ref 1.7–2.4)

## 2015-01-10 LAB — TROPONIN I: TROPONIN I: 0.04 ng/mL — AB (ref <0.031)

## 2015-01-10 MED ORDER — SODIUM CHLORIDE 0.9 % IV SOLN
1000.0000 mL | Freq: Once | INTRAVENOUS | Status: AC
  Administered 2015-01-10: 1000 mL via INTRAVENOUS

## 2015-01-10 NOTE — ED Notes (Signed)
Pt able to ambulate to wheelchair.  Gait steady and even.

## 2015-01-10 NOTE — ED Notes (Signed)
Pt's son-in-law came out of room to tell this RN that pt was having chest pain.  This RN re-assessed and provided EKG to MD.

## 2015-01-10 NOTE — ED Notes (Signed)
EDP aware of pt's HR and EKG provided

## 2015-01-10 NOTE — ED Notes (Signed)
Pt sts CP has resolved

## 2015-01-10 NOTE — ED Notes (Signed)
Pt appeared to be converted.  EKG provided to Dr. Venora Maples.  Pt BP low, instructed by Dr. Venora Maples to turn off Cardizem drip.

## 2015-01-20 DIAGNOSIS — M5442 Lumbago with sciatica, left side: Secondary | ICD-10-CM | POA: Diagnosis not present

## 2015-01-24 ENCOUNTER — Other Ambulatory Visit: Payer: Self-pay | Admitting: Internal Medicine

## 2015-01-31 ENCOUNTER — Encounter: Payer: Self-pay | Admitting: Physician Assistant

## 2015-01-31 ENCOUNTER — Ambulatory Visit (INDEPENDENT_AMBULATORY_CARE_PROVIDER_SITE_OTHER): Payer: Commercial Managed Care - HMO | Admitting: Physician Assistant

## 2015-01-31 VITALS — BP 118/58 | HR 62 | Ht 62.0 in | Wt 146.0 lb

## 2015-01-31 DIAGNOSIS — I480 Paroxysmal atrial fibrillation: Secondary | ICD-10-CM | POA: Diagnosis not present

## 2015-01-31 DIAGNOSIS — R609 Edema, unspecified: Secondary | ICD-10-CM | POA: Diagnosis not present

## 2015-01-31 DIAGNOSIS — I2510 Atherosclerotic heart disease of native coronary artery without angina pectoris: Secondary | ICD-10-CM

## 2015-01-31 NOTE — Assessment & Plan Note (Signed)
Patient went to the emergency room for rapid atrial fibrillation last month converted to normal sinus rhythm with IV diltiazem. This happened in the setting of significant arthritic pain. Patient has had 2 other episodes of heart rate about 112 that comes down quickly with rest. She is in normal sinus rhythm today. I'm reluctant to increase her metoprolol because of heart rates in the low 60s at rest. I told her she could take an extra half of metoprolol if her heart rate got up to 112. Follow-up with Dr.Nahser in 2-3 months.

## 2015-01-31 NOTE — Assessment & Plan Note (Signed)
A shunt has chronic lower extremity edema. She does eat a lot of processed foods. Recommend low sodium diet, compression stockings, keeping her feet up more during the day. This is difficult for her since she takes care of her husband all day long.

## 2015-01-31 NOTE — Patient Instructions (Signed)
Medication Instructions:  1) You may take an extra half tablet of your Metoprolol if your heart rate is above 100.  Labwork: None  Testing/Procedures: None  Follow-Up: Your physician recommends that you schedule a follow-up appointment in: 2-3 months with Dr. Acie Fredrickson.   Any Other Special Instructions Will Be Listed Below (If Applicable).  Low-Sodium Eating Plan Sodium raises blood pressure and causes water to be held in the body. Getting less sodium from food will help lower your blood pressure, reduce any swelling, and protect your heart, liver, and kidneys. We get sodium by adding salt (sodium chloride) to food. Most of our sodium comes from canned, boxed, and frozen foods. Restaurant foods, fast foods, and pizza are also very high in sodium. Even if you take medicine to lower your blood pressure or to reduce fluid in your body, getting less sodium from your food is important. WHAT IS MY PLAN? Most people should limit their sodium intake to 2,300 mg a day. Your health care provider recommends that you limit your sodium intake to _2,000mg __ a day.  WHAT DO I NEED TO KNOW ABOUT THIS EATING PLAN? For the low-sodium eating plan, you will follow these general guidelines:  Choose foods with a % Daily Value for sodium of less than 5% (as listed on the food label).   Use salt-free seasonings or herbs instead of table salt or sea salt.   Check with your health care provider or pharmacist before using salt substitutes.   Eat fresh foods.  Eat more vegetables and fruits.  Limit canned vegetables. If you do use them, rinse them well to decrease the sodium.   Limit cheese to 1 oz (28 g) per day.   Eat lower-sodium products, often labeled as "lower sodium" or "no salt added."  Avoid foods that contain monosodium glutamate (MSG). MSG is sometimes added to [place] food and some canned foods.  Check food labels (Nutrition Facts labels) on foods to learn how much sodium is in one  serving.  Eat more home-cooked food and less restaurant, buffet, and fast food.  When eating at a restaurant, ask that your food be prepared with less salt or none, if possible.  HOW DO I READ FOOD LABELS FOR SODIUM INFORMATION? The Nutrition Facts label lists the amount of sodium in one serving of the food. If you eat more than one serving, you must multiply the listed amount of sodium by the number of servings. Food labels may also identify foods as:  Sodium free--Less than 5 mg in a serving.  Very low sodium--35 mg or less in a serving.  Low sodium--140 mg or less in a serving.  Light in sodium--50% less sodium in a serving. For example, if a food that usually has 300 mg of sodium is changed to become light in sodium, it will have 150 mg of sodium.  Reduced sodium--25% less sodium in a serving. For example, if a food that usually has 400 mg of sodium is changed to reduced sodium, it will have 300 mg of sodium. WHAT FOODS CAN I EAT? Grains Low-sodium cereals, including oats, puffed wheat and rice, and shredded wheat cereals. Low-sodium crackers. Unsalted rice and pasta. Lower-sodium bread.  Vegetables Frozen or fresh vegetables. Low-sodium or reduced-sodium canned vegetables. Low-sodium or reduced-sodium tomato sauce and paste. Low-sodium or reduced-sodium tomato and vegetable juices.  Fruits Fresh, frozen, and canned fruit. Fruit juice.  Meat and Other Protein Products Low-sodium canned tuna and salmon. Fresh or frozen meat, poultry, seafood, and fish.  Lamb. Unsalted nuts. Dried beans, peas, and lentils without added salt. Unsalted canned beans. Homemade soups without salt. Eggs.  Dairy Milk. Soy milk. Ricotta cheese. Low-sodium or reduced-sodium cheeses. Yogurt.  Condiments Fresh and dried herbs and spices. Salt-free seasonings. Onion and garlic powders. Low-sodium varieties of mustard and ketchup. Lemon juice.  Fats and Oils Reduced-sodium salad dressings. Unsalted  butter.  Other Unsalted popcorn and pretzels.  The items listed above may not be a complete list of recommended foods or beverages. Contact your dietitian for more options. WHAT FOODS ARE NOT RECOMMENDED? Grains Instant hot cereals. Bread stuffing, pancake, and biscuit mixes. Croutons. Seasoned rice or pasta mixes. Noodle soup cups. Boxed or frozen macaroni and cheese. Self-rising flour. Regular salted crackers. Vegetables Regular canned vegetables. Regular canned tomato sauce and paste. Regular tomato and vegetable juices. Frozen vegetables in sauces. Salted french fries. Olives. Angie Fava. Relishes. Sauerkraut. Salsa. Meat and Other Protein Products Salted, canned, smoked, spiced, or pickled meats, seafood, or fish. Bacon, ham, sausage, hot dogs, corned beef, chipped beef, and packaged luncheon meats. Salt pork. Jerky. Pickled herring. Anchovies, regular canned tuna, and sardines. Salted nuts. Dairy Processed cheese and cheese spreads. Cheese curds. Blue cheese and cottage cheese. Buttermilk.  Condiments Onion and garlic salt, seasoned salt, table salt, and sea salt. Canned and packaged gravies. Worcestershire sauce. Tartar sauce. Barbecue sauce. Teriyaki sauce. Soy sauce, including reduced sodium. Steak sauce. Fish sauce. Oyster sauce. Cocktail sauce. Horseradish. Regular ketchup and mustard. Meat flavorings and tenderizers. Bouillon cubes. Hot sauce. Tabasco sauce. Marinades. Taco seasonings. Relishes. Fats and Oils Regular salad dressings. Salted butter. Margarine. Ghee. Bacon fat.  Other Potato and tortilla chips. Corn chips and puffs. Salted popcorn and pretzels. Canned or dried soups. Pizza. Frozen entrees and pot pies.  The items listed above may not be a complete list of foods and beverages to avoid. Contact your dietitian for more information. Document Released: 12/08/2001 Document Revised: 06/23/2013 Document Reviewed: 04/22/2013 [facility] Patient Information 2015 [place],  [place]. This information is not intended to replace advice given to you by your health care provider. Make sure you discuss any questions you have with your health care provider.

## 2015-01-31 NOTE — Assessment & Plan Note (Signed)
Stable without chest pain 

## 2015-01-31 NOTE — Progress Notes (Signed)
Cardiology Office Note   Date:  01/31/2015   ID:  Shannon [name], DOB 10-15-1931, MRN [id number]  PCP:  Haywood Pao, MD  Cardiologist:  Dr. Acie Fredrickson  Chief Complaint: palpitations    History of Present Illness: Shannon [name] is a 81 y.o. female who presents for post ER follow up. Patient was having 2 weeks of terrible leg pain. She also is taking care of her 27 yo husband. She went into rapid atrial fibrillation at 150/m converted to NSR with IV Cardizem in the ER. Since then she's had 2 episodes of her heart rate going up to 112/m but slowed down quickly when she went to bed. The past week it's been in the 60's.   She continues to have lower extremity edema. She was told to cut back on her salt intake when she was in the emergency room and has tried to do so. She also suffers from severe arthritis.  She has a history of CAD status post stenting of the OM1 in 2007, atrial flutter ablation in 2014, low risk Myoview in 2014  Past Medical History  Diagnosis Date  . CAD (coronary artery disease)     a. 05/2006 Cath/PCI: LAD 53m D1 40 ost, LCX nl, OM1 95 (3.0x20 Taxus DES), RCA nl, EF 40% w/ lat AK;  04/2007 Ex MV: minimal lat ischemia in area of prior infarct->low risk-> med Rx.  .Marland KitchenHyperlipidemia   . Diabetes mellitus   . MVP (mitral valve prolapse)     a. 10/2003 Echo: nl LV fxn, mild MR/TR/AS  . Dysrhythmia     atrial fib& flutter  . Myocardial infarction   . Sleep apnea   . Transient ischemic attack   . Arthritis     Past Surgical History  Procedure Laterality Date  . Tonsillectomy    . Coronary angioplasty      Status post PTCA and stenting of the first obtuse marginal-05/13/2006. We placed a 3.0 x 20 mm Taxus stent. It was post dilated using a 3.25 mm noncompliant balloon up to 14 atmospheres  . Cataracts    . Atrial flutter ablation N/A 10/09/2012    Procedure: ATRIAL FLUTTER ABLATION;  Surgeon: JThompson Grayer MD;  [facility];  Service: Cardiovascular;   Laterality: N/A;     Current Outpatient Prescriptions  Medication Sig Dispense Refill  . ACCU-CHEK SMARTVIEW test strip TEST TWICE DAILY AS DIRECTED.  5  . atorvastatin (LIPITOR) 20 MG tablet TAKE ONE TABLET BY MOUTH ONE TIME DAILY  90 tablet 3  . metFORMIN (GLUMETZA) 500 MG (MOD) 24 hr tablet Take 1 tablet (500 mg total) by mouth daily.    . metoprolol succinate (TOPROL-XL) 50 MG 24 hr tablet TAKE ONE TABLET BY MOUTH ONE TIME DAILY 90 tablet 3  . nitroGLYCERIN (NITROSTAT) 0.4 MG SL tablet Place 0.4 mg under the tongue every 5 (five) minutes x 3 doses as needed for chest pain. Up to 3 doses, if pain continues, call 911.    .Marland KitchenPRADAXA 150 MG CAPS capsule TAKE ONE CAPSULE BY MOUTH TWICE DAILY 60 capsule 9  . VITAMIN D, CHOLECALCIFEROL, PO Take 2,000 Units by mouth daily.      No current facility-administered medications for this visit.    Allergies:   Demerol; Erythromycin; Penicillins; and Percocet    Social History:  The patient  reports that she quit smoking about 51 years ago. Her smoking use included Cigarettes. She smoked 1.00 pack per day. She has never used smokeless tobacco.  She reports that she does not drink alcohol or use illicit drugs.   Family History:  The patient's    family history includes Alzheimer's disease in her sister; Coronary artery disease in her sister; Heart attack in her brother, father, mother, and sister; Stroke in her mother and sister.    ROS:  Please see the history of present illness.   Otherwise, review of systems are positive for chronic arthritis with aches and pains throughout her body, on her feet all day taking care of her husband.   All other systems are reviewed and negative.    PHYSICAL EXAM: VS:  BP 118/58 mmHg  Pulse 62  Ht 5' 2"  (1.575 m)  Wt 146 lb (66.225 kg)  BMI 26.70 kg/m2 , BMI Body mass index is 26.7 kg/(m^2). GEN: Well nourished, well developed, in no acute distress Neck: no JVD, HJR, carotid bruits, or masses Cardiac:  RRR; no  murmurs,gallop, rubs, thrill or heave,  Respiratory:  clear to auscultation bilaterally, normal work of breathing GI: soft, nontender, nondistended, + BS MS: no deformity or atrophy Extremities: +2 edema bilaterally without cyanosis, clubbing,  good distal pulses bilaterally.  Skin: warm and dry, no rash Neuro:  Strength and sensation are intact    EKG:  EKG is ordered today. The ekg ordered today demonstrates normal sinus rhythm at 62 bpm with PAC, poor R wave progression anteriorly  Recent Labs: 09/20/2014: ALT 14 01/09/2015: BUN 15; Creatinine, Ser 0.75; Hemoglobin 12.8; Platelets 181; Potassium 4.2; Sodium 135 01/10/2015: Magnesium 1.9    Lipid Panel    Component Value Date/Time   CHOL 115 09/20/2014 1714   TRIG 109.0 09/20/2014 1714   HDL 66.70 09/20/2014 1714   CHOLHDL 2 09/20/2014 1714   VLDL 21.8 09/20/2014 1714   LDLCALC 27 09/20/2014 1714      Wt Readings from Last 3 Encounters:  01/31/15 146 lb (66.225 kg)  01/09/15 146 lb 8 oz (66.452 kg)  09/20/14 145 lb 9.6 oz (66.044 kg)      Other studies Reviewed: Additional studies/ records that were reviewed today include and review of the records demonstrates:  Cardiac cath 2007 CONCLUSIONS:  Successful PTC and stenting of the first obtuse marginal  artery.    She has moderate disease in her left anterior descending artery.  Will  continue medical therapy.  We have given her 300 mg of Plavix in the cath  lab.   ASSESSMENT AND PLAN: Atrial fibrillation Patient went to the emergency room for rapid atrial fibrillation last month converted to normal sinus rhythm with IV diltiazem. This happened in the setting of significant arthritic pain. Patient has had 2 other episodes of heart rate about 112 that comes down quickly with rest. She is in normal sinus rhythm today. I'm reluctant to increase her metoprolol because of heart rates in the low 60s at rest. I told her she could take an extra half of metoprolol if her heart  rate got up to 112. Follow-up with Dr.Nahser in 2-3 months.  CAD (coronary artery disease) Stable without chest pain  Edema A shunt has chronic lower extremity edema. She does eat a lot of processed foods. Recommend low sodium diet, compression stockings, keeping her feet up more during the day. This is difficult for her since she takes care of her husband all day long.     Sumner Boast, PA-C  01/31/2015 11:57 AM    [facility] [place], [place], [place]  [address] Phone: [phone]; Fax: [phone]

## 2015-02-07 ENCOUNTER — Telehealth: Payer: Self-pay | Admitting: *Deleted

## 2015-02-07 NOTE — Telephone Encounter (Signed)
Per note from Maude Leriche, LPN, pt received "equipment." After calling pt, equipment is for her husband, Shannon [name] MRN: [id number]. They did receive a Carelink box but did not receive a WireX. I ordered [employer] and gave device clinic # to Shannon [name]. She is aware they cannot send a remote without having both parts. She agreed to call me once her Shannon [name] arrives.

## 2015-03-29 ENCOUNTER — Telehealth: Payer: Self-pay | Admitting: Cardiovascular Disease

## 2015-03-29 MED ORDER — METOPROLOL SUCCINATE ER 50 MG PO TB24: 50.0000 mg | ORAL_TABLET | Freq: Every day | ORAL | Status: DC

## 2015-03-29 NOTE — Telephone Encounter (Signed)
Spoke with patient who states she has been having worsening episodes of fast heart rate since Friday 9/23.  She states she did not get much sleep this weekend and thinks this exacerbated her symptoms.  She states that she has been taking an occasional extra dose of Metoprolol as advised by Ermalinda Barrios, PA at last office visit and this seems to help.  She took 50 mg of Metoprolol at 2 pm today.  I advised her to start tomorrow with 50 mg in the am and 25 mg in the pm per Dr. Acie Fredrickson.  She is scheduled for follow-up with Dr. Acie Fredrickson on 10/4.  I advised her that we will reevaluate at that time and to call sooner if symptoms worsen or if other concerns arise.  She thanked me for the call.

## 2015-03-29 NOTE — Telephone Encounter (Signed)
New Message  Patient c/o Palpitations:  High priority if patient c/o lightheadedness and shortness of breath.  1. How long have you been having palpitations? Fri 9/23  2. Are you currently experiencing lightheadedness and shortness of breath? Yes Only when HR gets to around 130   3. Have you checked your BP and heart rate? (document readings)  9/27-  9:30 am 108/88 p 114 pm  123/84 p 92..  4. Are you experiencing any other symptoms? no

## 2015-04-04 ENCOUNTER — Other Ambulatory Visit: Payer: Self-pay

## 2015-04-05 ENCOUNTER — Encounter: Payer: Self-pay | Admitting: Cardiovascular Disease

## 2015-04-05 ENCOUNTER — Ambulatory Visit (INDEPENDENT_AMBULATORY_CARE_PROVIDER_SITE_OTHER): Payer: Commercial Managed Care - HMO | Admitting: Cardiovascular Disease

## 2015-04-05 VITALS — BP 110/60 | HR 151 | Ht 62.0 in | Wt 147.4 lb

## 2015-04-05 DIAGNOSIS — I4891 Unspecified atrial fibrillation: Secondary | ICD-10-CM | POA: Diagnosis not present

## 2015-04-05 DIAGNOSIS — I480 Paroxysmal atrial fibrillation: Secondary | ICD-10-CM | POA: Diagnosis not present

## 2015-04-05 DIAGNOSIS — I2510 Atherosclerotic heart disease of native coronary artery without angina pectoris: Secondary | ICD-10-CM | POA: Diagnosis not present

## 2015-04-05 DIAGNOSIS — E785 Hyperlipidemia, unspecified: Secondary | ICD-10-CM

## 2015-04-05 LAB — TSH: TSH: 3.528 u[IU]/mL (ref 0.350–4.500)

## 2015-04-05 LAB — BASIC METABOLIC PANEL
BUN: 18 mg/dL (ref 7–25)
CHLORIDE: 103 mmol/L (ref 98–110)
CO2: 24 mmol/L (ref 20–31)
Calcium: 9.2 mg/dL (ref 8.6–10.4)
Creat: 0.89 mg/dL — ABNORMAL HIGH (ref 0.60–0.88)
Glucose, Bld: 140 mg/dL — ABNORMAL HIGH (ref 65–99)
POTASSIUM: 4.4 mmol/L (ref 3.5–5.3)
SODIUM: 135 mmol/L (ref 135–146)

## 2015-04-05 MED ORDER — AMIODARONE HCL 200 MG PO TABS: 400.0000 mg | ORAL_TABLET | Freq: Two times a day (BID) | ORAL | Status: DC

## 2015-04-05 NOTE — Patient Instructions (Addendum)
Medication Instructions:  START Amiodarone 400 mg twice daily - take 12 hours apart   Labwork: TODAY - TSH, basic metabolic panel   Testing/Procedures: Your physician has requested that you have an echocardiogram. Echocardiography is a painless test that uses sound waves to create images of your heart. It provides your doctor with information about the size and shape of your heart and how well your heart's chambers and valves are working. This procedure takes approximately one hour. There are no restrictions for this procedure.   Follow-Up: Your physician recommends that you schedule a follow-up appointment in: 1 week - Oct. 11 at 8:15 am

## 2015-04-05 NOTE — Progress Notes (Signed)
Cardiology Office Note   Date:  04/05/2015   ID:  [name], DOB Mar 19, 1932, MRN [id number]  PCP:  Haywood Pao, MD  Cardiologist:   Thayer Headings, MD   Chief Complaint  Patient presents with  . Atrial Fibrillation    with RVR   1. Coronary artery disease: Status post PTCA and stenting of the first obtuse marginal-05/13/2006. We placed a 3.0 x 20 mm Taxus stent. It was post dilated using a 3.25 mm noncompliant balloon up to 14 atmospheres 2. Diabetes mellitus 3. Hyperlipidemia 4. Paroxysmal Atrial fib  History of Present Illness: Shannon [name] is an 81 y.o. female with a hx of CAD. She has some occasional episodes of chest pressure typically when she wakes up in the morning. She also wakes up with a severe headache. She's been worked up for sleep apnea. She works out in the yard on a fairly regular basis and does not have any chest pain or chest tightness with exercise.   She is active all day long. She gets tired but never has any chest pain.  Feb. 12, 2015:  Shannon [name] is doing well. She was in the hosptial recently with Atrial fib and atrial flutter. She had an EP study but was not able to be ablated. She was cardioverted and has maintained NSR. She is able to do all of her normal activities. She is tolerating her pradaxa . Sometimes she forgets  February 15, 2014:  Shannon [name] is doing well. No CP. She had had some bloody mucus and had some sinus problems. Likely due to the pradaxa. No significant arrhthymias    September 20, 2014: Shannon [name] is a 81 y.o. female who presents for follow up of her CAD She is upset about her husband, Shannon [name]. He fell last August and has been bedridden since that time.  Has not been able to get him to his pacer appts.   No CP or dyspnea.  She has occasional nose bleeds  - likely due to the Pradaxa   Oct. 4, 2016: Shannon [name] is seen today for evaluation of rapid atrial fib.  She has paroxysmal atrial fib - has been  in atrial fib off and of  for the past 12 days   Frequently takes extra metoprolol to help keep her  HR slower  Has had excessive fatigue, leg swelling    Past Medical History  Diagnosis Date  . CAD (coronary artery disease)     a. 05/2006 Cath/PCI: LAD 43m, D1 40 ost, LCX nl, OM1 95 (3.0x20 Taxus DES), RCA nl, EF 40% w/ lat AK;  04/2007 Ex MV: minimal lat ischemia in area of prior infarct->low risk-> med Rx.  [name] Hyperlipidemia   . Diabetes mellitus   . MVP (mitral valve prolapse)     a. 10/2003 Echo: nl LV fxn, mild MR/TR/AS  . Dysrhythmia     atrial fib& flutter  . Myocardial infarction ([place])   . Sleep apnea   . Transient ischemic attack   . Arthritis     Past Surgical History  Procedure Laterality Date  . Tonsillectomy    . Coronary angioplasty      Status post PTCA and stenting of the first obtuse marginal-05/13/2006. We placed a 3.0 x 20 mm Taxus stent. It was post dilated using a 3.25 mm noncompliant balloon up to 14 atmospheres  . Cataracts    . Atrial flutter ablation N/A 10/09/2012    Procedure: ATRIAL FLUTTER ABLATION;  Surgeon: Thompson Grayer, MD;  Location:  [facility];  Service: Cardiovascular;  Laterality: N/A;     Current Outpatient Prescriptions  Medication Sig Dispense Refill  . ACCU-CHEK SMARTVIEW test strip TEST TWICE DAILY AS DIRECTED.  5  . ACCU-CHEK SOFTCLIX LANCETS lancets See admin instructions.  6  . atorvastatin (LIPITOR) 20 MG tablet TAKE ONE TABLET BY MOUTH ONE TIME DAILY  90 tablet 3  . ciprofloxacin (CIPRO) 250 MG tablet Take 250 mg by mouth 2 (two) times daily.  0  . metFORMIN (GLUMETZA) 500 MG (MOD) 24 hr tablet Take 1 tablet (500 mg total) by mouth daily.    . metoprolol succinate (TOPROL-XL) 50 MG 24 hr tablet Take 1 tablet (50 mg total) by mouth daily. Take 1/2 tab at night to control fast heart rate 90 tablet 3  . nitroGLYCERIN (NITROSTAT) 0.4 MG SL tablet Place 0.4 mg under the tongue every 5 (five) minutes x 3 doses as needed for chest pain.  Up to 3 doses, if pain continues, call 911.    [name] PRADAXA 150 MG CAPS capsule TAKE ONE CAPSULE BY MOUTH TWICE DAILY 60 capsule 9  . VITAMIN D, CHOLECALCIFEROL, PO Take 2,000 Units by mouth daily.     [name] HYDROcodone-acetaminophen (NORCO/VICODIN) 5-325 MG tablet TAKE 1/2 TO 1 TABLET BY MOUTH 2-3 TIMES DAILY AS NEEDED FOR SEVERE PAIN  0   No current facility-administered medications for this visit.    Allergies:   Demerol; Erythromycin; Penicillins; and Percocet    Social History:  The patient  reports that she quit smoking about 51 years ago. Her smoking use included Cigarettes. She smoked 1.00 pack per day. She has never used smokeless tobacco. She reports that she does not drink alcohol or use illicit drugs.   Family History:  The patient's family history includes Alzheimer's disease in her sister; Coronary artery disease in her sister; Heart attack in her brother, father, mother, and sister; Stroke in her mother and sister.    ROS:  Please see the history of present illness.    Review of Systems: Constitutional:  denies fever, chills, diaphoresis, appetite change and fatigue.  HEENT: denies photophobia, eye pain, redness, hearing loss, ear pain, congestion, sore throat, rhinorrhea, sneezing, neck pain, neck stiffness and tinnitus.  Respiratory: denies SOB, DOE, cough, chest tightness, and wheezing.  Cardiovascular: denies chest pain, palpitations and leg swelling.  Gastrointestinal: denies nausea, vomiting, abdominal pain, diarrhea, constipation, blood in stool.  Genitourinary: denies dysuria, urgency, frequency, hematuria, flank pain and difficulty urinating.  Musculoskeletal: denies  myalgias, back pain, joint swelling, arthralgias and gait problem.   Skin: denies pallor, rash and wound.  Neurological: denies dizziness, seizures, syncope, weakness, light-headedness, numbness and headaches.   Hematological: denies adenopathy, easy bruising, personal or family bleeding history.    Psychiatric/ Behavioral: denies suicidal ideation, mood changes, confusion, nervousness, sleep disturbance and agitation.       All other systems are reviewed and negative.    PHYSICAL EXAM: VS:  BP 110/60 mmHg  Pulse 151  Ht 5\' 2"  (1.575 m)  Wt 66.86 kg (147 lb 6.4 oz)  BMI 26.95 kg/m2  SpO2 99% , BMI Body mass index is 26.95 kg/(m^2). GEN: Well nourished, well developed, in no acute distress HEENT: normal Neck: no JVD, carotid bruits, or masses Cardiac: RRR; systolic  murmurs, rubs, or gallops, 2+  edema bilaterally .  Respiratory:  clear to auscultation bilaterally, normal work of breathing GI: soft, nontender, nondistended, + BS MS: no deformity or atrophy Skin: warm and dry, no rash Neuro:  Strength and sensation are intact Psych: normal   EKG:  EKG is ordered today. The ekg ordered today demonstrates rapid atrial fib with HR of 151.  No ST or T wave changes.    Recent Labs: 09/20/2014: ALT 14 01/09/2015: BUN 15; Creatinine, Ser 0.75; Hemoglobin 12.8; Platelets 181; Potassium 4.2; Sodium 135 01/10/2015: Magnesium 1.9    Lipid Panel    Component Value Date/Time   CHOL 115 09/20/2014 1714   TRIG 109.0 09/20/2014 1714   HDL 66.70 09/20/2014 1714   CHOLHDL 2 09/20/2014 1714   VLDL 21.8 09/20/2014 1714   LDLCALC 27 09/20/2014 1714      Wt Readings from Last 3 Encounters:  04/05/15 66.86 kg (147 lb 6.4 oz)  01/31/15 66.225 kg (146 lb)  01/09/15 66.452 kg (146 lb 8 oz)      Other studies Reviewed: Additional studies/ records that were reviewed today include: . Review of the above records demonstrates:    ASSESSMENT AND PLAN:  1. Coronary artery disease: Status post PTCA and stenting of the first obtuse marginal-05/13/2006. We placed a 3.0 x 20 mm Taxus stent. It was post dilated using a 3.25 mm noncompliant balloon up to 14 atmospheres 2. Diabetes mellitus  - followed by her general medical doctor.  3. Hyperlipidemia- she is followed by Dr. Osborne Casco.      4. Paroxysmal Atrial fib-  She's back in atrial fibrillation today. She has known paroxysmal atrial  fibrillation. She thinks that she's been in now for 12 days.  We'll start her on amiodarone 400 mg twice a day. I'll see her again in one week for follow-up visit and EKG. It sounds like she has sick sinus syndrome.   She reports having HR in the 40s occasionally .  Will need to watch for this .  She may need a pacer at some point .     we'll get an echocardiogram.   We'll check labs today including a basic medical profile and TSH. Continue with her current meds otherwise.   Current medicines are reviewed at length with the patient today.  The patient does not have concerns regarding medicines.  The following changes have been made:  no change  Labs/ tests ordered today include:  No orders of the defined types were placed in this encounter.     Disposition:   FU with me in 1 week for office visit and ECG    Signed, Nahser, Wonda Cheng, MD  04/05/2015 11:39 AM    [facility], [place], [place]  [address] Phone: [phone]; Fax: [phone]

## 2015-04-07 ENCOUNTER — Other Ambulatory Visit: Payer: Self-pay

## 2015-04-07 ENCOUNTER — Ambulatory Visit (HOSPITAL_COMMUNITY): Payer: Commercial Managed Care - HMO | Attending: Cardiovascular Disease

## 2015-04-07 DIAGNOSIS — I517 Cardiomegaly: Secondary | ICD-10-CM | POA: Insufficient documentation

## 2015-04-07 DIAGNOSIS — I340 Nonrheumatic mitral (valve) insufficiency: Secondary | ICD-10-CM | POA: Insufficient documentation

## 2015-04-07 DIAGNOSIS — I351 Nonrheumatic aortic (valve) insufficiency: Secondary | ICD-10-CM | POA: Diagnosis not present

## 2015-04-07 DIAGNOSIS — E119 Type 2 diabetes mellitus without complications: Secondary | ICD-10-CM | POA: Diagnosis not present

## 2015-04-07 DIAGNOSIS — I2510 Atherosclerotic heart disease of native coronary artery without angina pectoris: Secondary | ICD-10-CM | POA: Diagnosis not present

## 2015-04-07 DIAGNOSIS — I059 Rheumatic mitral valve disease, unspecified: Secondary | ICD-10-CM | POA: Insufficient documentation

## 2015-04-07 DIAGNOSIS — I480 Paroxysmal atrial fibrillation: Secondary | ICD-10-CM

## 2015-04-07 DIAGNOSIS — I071 Rheumatic tricuspid insufficiency: Secondary | ICD-10-CM | POA: Insufficient documentation

## 2015-04-07 DIAGNOSIS — I4891 Unspecified atrial fibrillation: Secondary | ICD-10-CM | POA: Diagnosis not present

## 2015-04-08 ENCOUNTER — Other Ambulatory Visit: Payer: Self-pay | Admitting: Cardiovascular Disease

## 2015-04-12 ENCOUNTER — Encounter: Payer: Self-pay | Admitting: Cardiovascular Disease

## 2015-04-12 ENCOUNTER — Ambulatory Visit (INDEPENDENT_AMBULATORY_CARE_PROVIDER_SITE_OTHER): Payer: Commercial Managed Care - HMO | Admitting: Cardiovascular Disease

## 2015-04-12 VITALS — BP 110/70 | HR 94 | Ht 62.0 in | Wt 150.0 lb

## 2015-04-12 DIAGNOSIS — I480 Paroxysmal atrial fibrillation: Secondary | ICD-10-CM | POA: Diagnosis not present

## 2015-04-12 DIAGNOSIS — I483 Typical atrial flutter: Secondary | ICD-10-CM

## 2015-04-12 DIAGNOSIS — E785 Hyperlipidemia, unspecified: Secondary | ICD-10-CM | POA: Diagnosis not present

## 2015-04-12 DIAGNOSIS — I2510 Atherosclerotic heart disease of native coronary artery without angina pectoris: Secondary | ICD-10-CM

## 2015-04-12 NOTE — Progress Notes (Signed)
Cardiology Office Note   Date:  04/12/2015   ID:  Shannon [name], DOB [birth year], MRN [id number]  PCP:  Haywood Pao, MD  Cardiologist:   Thayer Headings, MD   Chief Complaint  Patient presents with  . Atrial Fibrillation   1. Coronary artery disease: Status post PTCA and stenting of the first obtuse marginal-05/13/2006. We placed a 3.0 x 20 mm Taxus stent. It was post dilated using a 3.25 mm noncompliant balloon up to 14 atmospheres 2. Diabetes mellitus 3. Hyperlipidemia 4. Paroxysmal Atrial fib  History of Present Illness: Shannon [name] is an 81 y.o. female with a hx of CAD. She has some occasional episodes of chest pressure typically when she wakes up in the morning. She also wakes up with a severe headache. She's been worked up for sleep apnea. She works out in the yard on a fairly regular basis and does not have any chest pain or chest tightness with exercise.   She is active all day long. She gets tired but never has any chest pain.  Feb. 12, 2015:  Shannon [name] is doing well. She was in the hosptial recently with Atrial fib and atrial flutter. She had an EP study but was not able to be ablated. She was cardioverted and has maintained NSR. She is able to do all of her normal activities. She is tolerating her pradaxa . Sometimes she forgets  February 15, 2014:  Shannon [name] is doing well. No CP. She had had some bloody mucus and had some sinus problems. Likely due to the pradaxa. No significant arrhthymias    September 20, 2014: Shannon [name] is a 81 y.o. female who presents for follow up of her CAD She is upset about her husband, Shannon [name]. He fell last August and has been bedridden since that time.  Has not been able to get him to his pacer appts.   No CP or dyspnea.  She has occasional nose bleeds  - likely due to the Pradaxa   Oct. 4, 2016: Shannon [name] is seen today for evaluation of rapid atrial fib.  She has paroxysmal atrial fib - has been in atrial fib  off and of  for the past 12 days   Frequently takes extra metoprolol to help keep her  HR slower  Has had excessive fatigue, leg swelling   Oct. 11, 2016 Shannon [name] is back for follow up Was started on amiodarone. She's had some nausea and lack of appetite since starting the amiodarone. She has converted to sinus rhythm. She is feeling better now that she is in normal sinus rhythm. She's had a history of paroxysmal atrial fibrillation. She has seen Dr. Rayann Heman in the past. They discussed A. fib ablation but Dr. Rayann Heman decided that she was not a good candidate for ablation.  Past Medical History  Diagnosis Date  . CAD (coronary artery disease)     a. 05/2006 Cath/PCI: LAD 92m, D1 40 ost, LCX nl, OM1 95 (3.0x20 Taxus DES), RCA nl, EF 40% w/ lat AK;  04/2007 Ex MV: minimal lat ischemia in area of prior infarct->low risk-> med Rx.  [name] Hyperlipidemia   . Diabetes mellitus   . MVP (mitral valve prolapse)     a. 10/2003 Echo: nl LV fxn, mild MR/TR/AS  . Dysrhythmia     atrial fib& flutter  . Myocardial infarction ([place])   . Sleep apnea   . Transient ischemic attack   . Arthritis     Past Surgical History  Procedure Laterality Date  .  Tonsillectomy    . Coronary angioplasty      Status post PTCA and stenting of the first obtuse marginal-05/13/2006. We placed a 3.0 x 20 mm Taxus stent. It was post dilated using a 3.25 mm noncompliant balloon up to 14 atmospheres  . Cataracts    . Atrial flutter ablation N/A 10/09/2012    Procedure: ATRIAL FLUTTER ABLATION;  Surgeon: Thompson Grayer, MD;  Location: [facility] CATH LAB;  Service: Cardiovascular;  Laterality: N/A;     Current Outpatient Prescriptions  Medication Sig Dispense Refill  . ACCU-CHEK SMARTVIEW test strip TEST TWICE DAILY AS DIRECTED.  5  . ACCU-CHEK SOFTCLIX LANCETS lancets See admin instructions.  6  . amiodarone (PACERONE) 200 MG tablet Take 2 tablets (400 mg total) by mouth 2 (two) times daily. 120 tablet 6  . atorvastatin (LIPITOR) 20 MG  tablet TAKE ONE TABLET BY MOUTH ONE TIME DAILY 90 tablet 3  . HYDROcodone-acetaminophen (NORCO/VICODIN) 5-325 MG tablet TAKE 1/2 TO 1 TABLET BY MOUTH 2-3 TIMES DAILY AS NEEDED FOR SEVERE PAIN  0  . metFORMIN (GLUCOPHAGE) 500 MG tablet Take 500 mg by mouth daily with breakfast.     . metoprolol succinate (TOPROL-XL) 50 MG 24 hr tablet Take 1 tablet (50 mg total) by mouth daily. Take 1/2 tab at night to control fast heart rate 90 tablet 3  . nitroGLYCERIN (NITROSTAT) 0.4 MG SL tablet Place 0.4 mg under the tongue every 5 (five) minutes x 3 doses as needed for chest pain. Up to 3 doses, if pain continues, call 911.    [name] PRADAXA 150 MG CAPS capsule TAKE ONE CAPSULE BY MOUTH TWICE DAILY 60 capsule 9  . VITAMIN D, CHOLECALCIFEROL, PO Take 2,000 Units by mouth daily.      No current facility-administered medications for this visit.    Allergies:   Demerol; Erythromycin; Penicillins; and Percocet    Social History:  The patient  reports that she quit smoking about 51 years ago. Her smoking use included Cigarettes. She smoked 1.00 pack per day. She has never used smokeless tobacco. She reports that she does not drink alcohol or use illicit drugs.   Family History:  The patient's family history includes Alzheimer's disease in her sister; Coronary artery disease in her sister; Heart attack in her brother, father, mother, and sister; Stroke in her mother and sister.    ROS:  Please see the history of present illness.    Review of Systems: Constitutional:  denies fever, chills, diaphoresis, appetite change and fatigue.  HEENT: denies photophobia, eye pain, redness, hearing loss, ear pain, congestion, sore throat, rhinorrhea, sneezing, neck pain, neck stiffness and tinnitus.  Respiratory: denies SOB, DOE, cough, chest tightness, and wheezing.  Cardiovascular: denies chest pain, palpitations and leg swelling.  Gastrointestinal: denies nausea, vomiting, abdominal pain, diarrhea, constipation, blood in  stool.  Genitourinary: denies dysuria, urgency, frequency, hematuria, flank pain and difficulty urinating.  Musculoskeletal: denies  myalgias, back pain, joint swelling, arthralgias and gait problem.   Skin: denies pallor, rash and wound.  Neurological: denies dizziness, seizures, syncope, weakness, light-headedness, numbness and headaches.   Hematological: denies adenopathy, easy bruising, personal or family bleeding history.  Psychiatric/ Behavioral: denies suicidal ideation, mood changes, confusion, nervousness, sleep disturbance and agitation.       All other systems are reviewed and negative.    PHYSICAL EXAM: VS:  BP 110/70 mmHg  Pulse 94  Ht 5\' 2"  (1.575 m)  Wt 68.04 kg (150 lb)  BMI 27.43 kg/m2 , BMI Body  mass index is 27.43 kg/(m^2). GEN: Well nourished, well developed, in no acute distress HEENT: normal Neck: no JVD, carotid bruits, or masses Cardiac: RRR; systolic  murmurs, rubs, or gallops, 2+  edema bilaterally .  Respiratory:  clear to auscultation bilaterally, normal work of breathing GI: soft, nontender, nondistended, + BS MS: no deformity or atrophy Skin: warm and dry, no rash Neuro:  Strength and sensation are intact Psych: normal   EKG:  EKG is ordered today. The ekg ordered today normal sinus rhythm at 94. She has a left anterior fascicular block.  Recent Labs: 09/20/2014: ALT 14 01/09/2015: Hemoglobin 12.8; Platelets 181 01/10/2015: Magnesium 1.9 04/05/2015: BUN 18; Creat 0.89*; Potassium 4.4; Sodium 135; TSH 3.528    Lipid Panel    Component Value Date/Time   CHOL 115 09/20/2014 1714   TRIG 109.0 09/20/2014 1714   HDL 66.70 09/20/2014 1714   CHOLHDL 2 09/20/2014 1714   VLDL 21.8 09/20/2014 1714   LDLCALC 27 09/20/2014 1714      Wt Readings from Last 3 Encounters:  04/12/15 68.04 kg (150 lb)  04/05/15 66.86 kg (147 lb 6.4 oz)  01/31/15 66.225 kg (146 lb)      Other studies Reviewed: Additional studies/ records that were reviewed today  include: . Review of the above records demonstrates:    ASSESSMENT AND PLAN:  1. Coronary artery disease: Status post PTCA and stenting of the first obtuse marginal-05/13/2006. We placed a 3.0 x 20 mm Taxus stent. It was post dilated using a 3.25 mm noncompliant balloon up to 14 atmospheres 2. Diabetes mellitus  - followed by her general medical doctor.  3. Hyperlipidemia- she is followed by Dr. Osborne Casco.     4. Paroxysmal Atrial fib-  She's back in normal sinus rhythm today.    Echocardiogram reveals normal left ventricle systolic function. She does have significant left atrial enlargement.  She's converted to sinus rhythm on amiodarone. She has been having some nausea associated with amiodarone 400 mg twice a day. We will decrease amiodarone to 200 mg a day and see if she tolerates this. She has already been seen by Dr. Rayann Heman and was determined to be a poor candidate for A. fib ablation. Continue current dose of Pradaxa and low-dose amiodarone. I'll see her in 3 months. We'll check a basic medical profile, liver enzymes, TSH, and lipids at that time. We discussed changing to Eliquis but it does not appear that she has any issues with the Pradaxa ( assuming that her nausea resolves when we decrease the dose of the amiodarone )  Current medicines are reviewed at length with the patient today.  The patient does not have concerns regarding medicines.  The following changes have been made:  no change  Labs/ tests ordered today include:  No orders of the defined types were placed in this encounter.     Disposition:   FU with me in 3 months for office visit and ECG    Signed, Deacon Gadbois, Wonda Cheng, MD  04/12/2015 8:45 AM    [facility] [address], [place], [place]  [address] Phone: [phone]; Fax: [phone]

## 2015-04-12 NOTE — Patient Instructions (Signed)
Medication Instructions:  DECREASE Amiodarone to 200 mg once daily   Labwork: Your physician recommends that you return for lab work (cholesterol, liver, TSH, basic metabolic panel) in: 3 months on the day of or a few days before your office visit with Dr. Acie Fredrickson.  You will need to FAST for this appointment - nothing to eat or drink after midnight the night before except water.    Testing/Procedures: None Ordered   Follow-Up: Your physician recommends that you schedule a follow-up appointment in: 3 months with  Dr. Acie Fredrickson.

## 2015-06-10 DIAGNOSIS — I2510 Atherosclerotic heart disease of native coronary artery without angina pectoris: Secondary | ICD-10-CM | POA: Diagnosis not present

## 2015-06-10 DIAGNOSIS — E559 Vitamin D deficiency, unspecified: Secondary | ICD-10-CM | POA: Diagnosis not present

## 2015-06-10 DIAGNOSIS — M859 Disorder of bone density and structure, unspecified: Secondary | ICD-10-CM | POA: Diagnosis not present

## 2015-06-10 DIAGNOSIS — G4733 Obstructive sleep apnea (adult) (pediatric): Secondary | ICD-10-CM | POA: Diagnosis not present

## 2015-06-10 DIAGNOSIS — I10 Essential (primary) hypertension: Secondary | ICD-10-CM | POA: Diagnosis not present

## 2015-06-10 DIAGNOSIS — E1151 Type 2 diabetes mellitus with diabetic peripheral angiopathy without gangrene: Secondary | ICD-10-CM | POA: Diagnosis not present

## 2015-06-10 DIAGNOSIS — Z955 Presence of coronary angioplasty implant and graft: Secondary | ICD-10-CM | POA: Diagnosis not present

## 2015-06-10 DIAGNOSIS — E7800 Pure hypercholesterolemia, unspecified: Secondary | ICD-10-CM | POA: Diagnosis not present

## 2015-07-15 DIAGNOSIS — Z6826 Body mass index (BMI) 26.0-26.9, adult: Secondary | ICD-10-CM | POA: Diagnosis not present

## 2015-07-15 DIAGNOSIS — I87319 Chronic venous hypertension (idiopathic) with ulcer of unspecified lower extremity: Secondary | ICD-10-CM | POA: Diagnosis not present

## 2015-07-15 DIAGNOSIS — R2243 Localized swelling, mass and lump, lower limb, bilateral: Secondary | ICD-10-CM | POA: Diagnosis not present

## 2015-07-15 DIAGNOSIS — I10 Essential (primary) hypertension: Secondary | ICD-10-CM | POA: Diagnosis not present

## 2015-07-15 DIAGNOSIS — I8310 Varicose veins of unspecified lower extremity with inflammation: Secondary | ICD-10-CM | POA: Diagnosis not present

## 2015-07-20 ENCOUNTER — Encounter: Payer: Self-pay | Admitting: Cardiovascular Disease

## 2015-07-20 DIAGNOSIS — I87319 Chronic venous hypertension (idiopathic) with ulcer of unspecified lower extremity: Secondary | ICD-10-CM | POA: Diagnosis not present

## 2015-07-20 DIAGNOSIS — Z6826 Body mass index (BMI) 26.0-26.9, adult: Secondary | ICD-10-CM | POA: Diagnosis not present

## 2015-07-20 DIAGNOSIS — I8310 Varicose veins of unspecified lower extremity with inflammation: Secondary | ICD-10-CM | POA: Diagnosis not present

## 2015-07-20 DIAGNOSIS — I480 Paroxysmal atrial fibrillation: Secondary | ICD-10-CM | POA: Diagnosis not present

## 2015-07-20 DIAGNOSIS — I10 Essential (primary) hypertension: Secondary | ICD-10-CM | POA: Diagnosis not present

## 2015-07-22 ENCOUNTER — Other Ambulatory Visit (INDEPENDENT_AMBULATORY_CARE_PROVIDER_SITE_OTHER): Payer: Commercial Managed Care - HMO | Admitting: *Deleted

## 2015-07-22 ENCOUNTER — Other Ambulatory Visit: Payer: Self-pay | Admitting: *Deleted

## 2015-07-22 ENCOUNTER — Encounter: Payer: Self-pay | Admitting: Cardiovascular Disease

## 2015-07-22 ENCOUNTER — Ambulatory Visit (INDEPENDENT_AMBULATORY_CARE_PROVIDER_SITE_OTHER): Payer: Commercial Managed Care - HMO | Admitting: Cardiovascular Disease

## 2015-07-22 VITALS — BP 92/62 | HR 64 | Ht 61.0 in | Wt 148.0 lb

## 2015-07-22 DIAGNOSIS — I480 Paroxysmal atrial fibrillation: Secondary | ICD-10-CM | POA: Diagnosis not present

## 2015-07-22 DIAGNOSIS — I4891 Unspecified atrial fibrillation: Secondary | ICD-10-CM | POA: Diagnosis not present

## 2015-07-22 DIAGNOSIS — I2510 Atherosclerotic heart disease of native coronary artery without angina pectoris: Secondary | ICD-10-CM

## 2015-07-22 DIAGNOSIS — E785 Hyperlipidemia, unspecified: Secondary | ICD-10-CM | POA: Diagnosis not present

## 2015-07-22 LAB — HEPATIC FUNCTION PANEL
ALBUMIN: 3.6 g/dL (ref 3.6–5.1)
ALT: 27 U/L (ref 6–29)
AST: 29 U/L (ref 10–35)
Alkaline Phosphatase: 63 U/L (ref 33–130)
BILIRUBIN TOTAL: 0.9 mg/dL (ref 0.2–1.2)
Bilirubin, Direct: 0.3 mg/dL — ABNORMAL HIGH (ref <=0.2)
Indirect Bilirubin: 0.6 mg/dL (ref 0.2–1.2)
Total Protein: 5.9 g/dL — ABNORMAL LOW (ref 6.1–8.1)

## 2015-07-22 LAB — BASIC METABOLIC PANEL
BUN: 20 mg/dL (ref 7–25)
CALCIUM: 9 mg/dL (ref 8.6–10.4)
CO2: 25 mmol/L (ref 20–31)
CREATININE: 0.94 mg/dL — AB (ref 0.60–0.88)
Chloride: 104 mmol/L (ref 98–110)
GLUCOSE: 102 mg/dL — AB (ref 65–99)
POTASSIUM: 4.1 mmol/L (ref 3.5–5.3)
Sodium: 139 mmol/L (ref 135–146)

## 2015-07-22 LAB — LIPID PANEL
CHOL/HDL RATIO: 1.6 ratio (ref <=5.0)
CHOLESTEROL: 103 mg/dL — AB (ref 125–200)
HDL: 64 mg/dL (ref >=46)
LDL Cholesterol: 28 mg/dL (ref <130)
TRIGLYCERIDES: 57 mg/dL (ref <150)
VLDL: 11 mg/dL (ref <30)

## 2015-07-22 LAB — TSH: TSH: 4.246 u[IU]/mL (ref 0.350–4.500)

## 2015-07-22 MED ORDER — AMIODARONE HCL 200 MG PO TABS: 400.0000 mg | ORAL_TABLET | Freq: Every day | ORAL | Status: DC

## 2015-07-22 MED ORDER — AMIODARONE HCL 200 MG PO TABS: 200.0000 mg | ORAL_TABLET | Freq: Every day | ORAL | Status: DC

## 2015-07-22 NOTE — Progress Notes (Signed)
Cardiology Office Note   Date:  07/22/2015   ID:  Shannon [name], DOB [birth year], MRN [id number]  PCP:  Haywood Pao, MD  Cardiologist:   Acie Fredrickson Wonda Cheng, MD   Chief Complaint  Patient presents with  . Follow-up    atrial fib, CAD   1. Coronary artery disease: Status post PTCA and stenting of the first obtuse marginal-05/13/2006. We placed a 3.0 x 20 mm Taxus stent. It was post dilated using a 3.25 mm noncompliant balloon up to 14 atmospheres 2. Diabetes mellitus 3. Hyperlipidemia 4. Paroxysmal Atrial fib  History of Present Illness: Shannon [name] is an 82 y.o. female with a hx of CAD. She has some occasional episodes of chest pressure typically when she wakes up in the morning. She also wakes up with a severe headache. She's been worked up for sleep apnea. She works out in the yard on a fairly regular basis and does not have any chest pain or chest tightness with exercise.   She is active all day long. She gets tired but never has any chest pain.  Feb. 12, 2015:  Shannon [name] is doing well. She was in the hosptial recently with Atrial fib and atrial flutter. She had an EP study but was not able to be ablated. She was cardioverted and has maintained NSR. She is able to do all of her normal activities. She is tolerating her pradaxa . Sometimes she forgets  February 15, 2014:  Shannon [name] is doing well. No CP. She had had some bloody mucus and had some sinus problems. Likely due to the pradaxa. No significant arrhthymias    September 20, 2014: Shannon [name] is a 82 y.o. female who presents for follow up of her CAD She is upset about her husband, Shannon [name]. He fell last August and has been bedridden since that time.  Has not been able to get him to his pacer appts.   No CP or dyspnea.  She has occasional nose bleeds  - likely due to the Pradaxa   Oct. 4, 2016: Shannon [name] is seen today for evaluation of rapid atrial fib.  She has paroxysmal atrial fib - has been in  atrial fib off and of  for the past 12 days   Frequently takes extra metoprolol to help keep her  HR slower  Has had excessive fatigue, leg swelling   Oct. 11, 2016 Shannon [name] is back for follow up Was started on amiodarone. She's had some nausea and lack of appetite since starting the amiodarone. She has converted to sinus rhythm. She is feeling better now that she is in normal sinus rhythm. She's had a history of paroxysmal atrial fibrillation. She has seen Dr. Rayann Heman in the past. They discussed A. fib ablation but Dr. Rayann Heman decided that she was not a good candidate for ablation.  Jan. 20, 2017:  Doing ok.  No dizziness, no dyspnea, no CP     Past Medical History  Diagnosis Date  . CAD (coronary artery disease)     a. 05/2006 Cath/PCI: LAD 6m, D1 40 ost, LCX nl, OM1 95 (3.0x20 Taxus DES), RCA nl, EF 40% w/ lat AK;  04/2007 Ex MV: minimal lat ischemia in area of prior infarct->low risk-> med Rx.  [name] Hyperlipidemia   . Diabetes mellitus   . MVP (mitral valve prolapse)     a. 10/2003 Echo: nl LV fxn, mild MR/TR/AS  . Dysrhythmia     atrial fib& flutter  . Myocardial infarction ([place])   . Sleep  apnea   . Transient ischemic attack   . Arthritis     Past Surgical History  Procedure Laterality Date  . Tonsillectomy    . Coronary angioplasty      Status post PTCA and stenting of the first obtuse marginal-05/13/2006. We placed a 3.0 x 20 mm Taxus stent. It was post dilated using a 3.25 mm noncompliant balloon up to 14 atmospheres  . Cataracts    . Atrial flutter ablation N/A 10/09/2012    Procedure: ATRIAL FLUTTER ABLATION;  Surgeon: Thompson Grayer, MD;  Location: [facility] CATH LAB;  Service: Cardiovascular;  Laterality: N/A;     Current Outpatient Prescriptions  Medication Sig Dispense Refill  . ACCU-CHEK SMARTVIEW test strip TEST TWICE DAILY AS DIRECTED.  5  . ACCU-CHEK SOFTCLIX LANCETS lancets See admin instructions.  6  . amiodarone (PACERONE) 200 MG tablet Take 2 tablets (400 mg  total) by mouth 2 (two) times daily. 120 tablet 6  . atorvastatin (LIPITOR) 20 MG tablet TAKE ONE TABLET BY MOUTH ONE TIME DAILY 90 tablet 3  . Dexlansoprazole (DEXILANT PO) Take 1 tablet by mouth daily.    [name] doxycycline (VIBRAMYCIN) 100 MG capsule Take 100 mg by mouth 2 (two) times daily.    . furosemide (LASIX) 20 MG tablet Take 20 mg by mouth daily.  0  . HYDROcodone-acetaminophen (NORCO/VICODIN) 5-325 MG tablet TAKE 1/2 TO 1 TABLET BY MOUTH 2-3 TIMES DAILY AS NEEDED FOR SEVERE PAIN  0  . KLOR-CON 10 10 MEQ tablet Take 10 mEq by mouth daily.  2  . metFORMIN (GLUCOPHAGE) 500 MG tablet Take 500 mg by mouth 2 (two) times daily.     . metoprolol succinate (TOPROL-XL) 50 MG 24 hr tablet Take 25 mg by mouth 2 (two) times daily. Take with or immediately following a meal.    . nitroGLYCERIN (NITROSTAT) 0.4 MG SL tablet Place 0.4 mg under the tongue every 5 (five) minutes x 3 doses as needed for chest pain. Up to 3 doses, if pain continues, call 911.    [name] PRADAXA 150 MG CAPS capsule TAKE ONE CAPSULE BY MOUTH TWICE DAILY 60 capsule 9  . VITAMIN D, CHOLECALCIFEROL, PO Take 2,000 Units by mouth daily.      No current facility-administered medications for this visit.    Allergies:   Demerol; Erythromycin; Penicillins; and Percocet    Social History:  The patient  reports that she quit smoking about 52 years ago. Her smoking use included Cigarettes. She smoked 1.00 pack per day. She has never used smokeless tobacco. She reports that she does not drink alcohol or use illicit drugs.   Family History:  The patient's family history includes Alzheimer's disease in her sister; Coronary artery disease in her sister; Heart attack in her brother, father, mother, and sister; Stroke in her mother and sister.    ROS:  Please see the history of present illness.    Review of Systems: Constitutional:  denies fever, chills, diaphoresis, appetite change and fatigue.  HEENT: denies photophobia, eye pain, redness,  hearing loss, ear pain, congestion, sore throat, rhinorrhea, sneezing, neck pain, neck stiffness and tinnitus.  Respiratory: denies SOB, DOE, cough, chest tightness, and wheezing.  Cardiovascular: denies chest pain, palpitations and leg swelling.  Gastrointestinal: denies nausea, vomiting, abdominal pain, diarrhea, constipation, blood in stool.  Genitourinary: denies dysuria, urgency, frequency, hematuria, flank pain and difficulty urinating.  Musculoskeletal: denies  myalgias, back pain, joint swelling, arthralgias and gait problem.   Skin: denies pallor, rash and wound.  Neurological: denies dizziness, seizures, syncope, weakness, light-headedness, numbness and headaches.   Hematological: denies adenopathy, easy bruising, personal or family bleeding history.  Psychiatric/ Behavioral: denies suicidal ideation, mood changes, confusion, nervousness, sleep disturbance and agitation.       All other systems are reviewed and negative.    PHYSICAL EXAM: VS:  BP 92/62 mmHg  Pulse 64  Ht 5\' 1"  (1.549 m)  Wt 148 lb (67.132 kg)  BMI 27.98 kg/m2 , BMI Body mass index is 27.98 kg/(m^2). GEN: Well nourished, well developed, in no acute distress HEENT: normal Neck: no JVD, carotid bruits, or masses Cardiac: RRR; systolic  murmurs, rubs, or gallops, 2+  edema bilaterally .  Respiratory:  clear to auscultation bilaterally, normal work of breathing GI: soft, nontender, nondistended, + BS MS: no deformity or atrophy Skin: warm and dry, no rash Neuro:  Strength and sensation are intact Psych: normal   EKG:  EKG is ordered today. The ekg ordered today normal sinus rhythm at 94. She has a left anterior fascicular block.  Recent Labs: 09/20/2014: ALT 14 01/09/2015: Hemoglobin 12.8; Platelets 181 01/10/2015: Magnesium 1.9 04/05/2015: BUN 18; Creat 0.89*; Potassium 4.4; Sodium 135; TSH 3.528    Lipid Panel    Component Value Date/Time   CHOL 115 09/20/2014 1714   TRIG 109.0 09/20/2014 1714    HDL 66.70 09/20/2014 1714   CHOLHDL 2 09/20/2014 1714   VLDL 21.8 09/20/2014 1714   LDLCALC 27 09/20/2014 1714      Wt Readings from Last 3 Encounters:  07/22/15 148 lb (67.132 kg)  04/12/15 150 lb (68.04 kg)  04/05/15 147 lb 6.4 oz (66.86 kg)      Other studies Reviewed: Additional studies/ records that were reviewed today include: . Review of the above records demonstrates:    ASSESSMENT AND PLAN:  1. Coronary artery disease: Status post PTCA and stenting of the first obtuse marginal-05/13/2006. We placed a 3.0 x 20 mm Taxus stent. It was post dilated using a 3.25 mm noncompliant balloon up to 14 atmospheres No angina.   2. Diabetes mellitus  - followed by her general medical doctor.  3. Hyperlipidemia- she is followed by Dr. Osborne Casco.     4. Paroxysmal Atrial fib-  She's back in normal sinus rhythm today since her cardioversion    The following changes have been made:  no change  Labs/ tests ordered today include:  No orders of the defined types were placed in this encounter.     Disposition:   FU with me in 6 months.   Will check as BMP, TSH, liver enz and lipids in 6 months     Signed, Nahser, Wonda Cheng, MD  07/22/2015 8:23 AM    [facility] [place], [place], [place]  [address] Phone: [phone]; Fax: [phone]

## 2015-07-22 NOTE — Addendum Note (Signed)
Addended by: Eulis Foster on: 07/22/2015 08:00 AM   Modules accepted: Orders

## 2015-07-22 NOTE — Addendum Note (Signed)
Addended by: Eulis Foster on: 07/22/2015 08:45 AM   Modules accepted: Orders

## 2015-07-22 NOTE — Patient Instructions (Signed)
**Note De-Identified Juwon Scripter Obfuscation** Medication Instructions:  Same-no changes (I have corrected the dose on your Amiodarone to 200 mg daily)  Labwork: In 6 months (at your next office visit). Please do not eat or drink after midnight the night before labs are drawn.  Testing/Procedures: None  Follow-Up: Your physician wants you to follow-up in: 6 months. You will receive a reminder letter in the mail two months in advance. If you don't receive a letter, please call our office to schedule the follow-up appointment.      If you need a refill on your cardiac medications before your next appointment, please call your pharmacy.

## 2015-07-24 DIAGNOSIS — I10 Essential (primary) hypertension: Secondary | ICD-10-CM | POA: Diagnosis not present

## 2015-07-24 DIAGNOSIS — L97211 Non-pressure chronic ulcer of right calf limited to breakdown of skin: Secondary | ICD-10-CM | POA: Diagnosis not present

## 2015-07-24 DIAGNOSIS — E119 Type 2 diabetes mellitus without complications: Secondary | ICD-10-CM | POA: Diagnosis not present

## 2015-07-24 DIAGNOSIS — I2510 Atherosclerotic heart disease of native coronary artery without angina pectoris: Secondary | ICD-10-CM | POA: Diagnosis not present

## 2015-07-24 DIAGNOSIS — E785 Hyperlipidemia, unspecified: Secondary | ICD-10-CM | POA: Diagnosis not present

## 2015-07-24 DIAGNOSIS — I480 Paroxysmal atrial fibrillation: Secondary | ICD-10-CM | POA: Diagnosis not present

## 2015-07-24 DIAGNOSIS — L97221 Non-pressure chronic ulcer of left calf limited to breakdown of skin: Secondary | ICD-10-CM | POA: Diagnosis not present

## 2015-07-24 DIAGNOSIS — Z7984 Long term (current) use of oral hypoglycemic drugs: Secondary | ICD-10-CM | POA: Diagnosis not present

## 2015-07-24 DIAGNOSIS — I872 Venous insufficiency (chronic) (peripheral): Secondary | ICD-10-CM | POA: Diagnosis not present

## 2015-07-28 DIAGNOSIS — Z7984 Long term (current) use of oral hypoglycemic drugs: Secondary | ICD-10-CM | POA: Diagnosis not present

## 2015-07-28 DIAGNOSIS — I10 Essential (primary) hypertension: Secondary | ICD-10-CM | POA: Diagnosis not present

## 2015-07-28 DIAGNOSIS — I2510 Atherosclerotic heart disease of native coronary artery without angina pectoris: Secondary | ICD-10-CM | POA: Diagnosis not present

## 2015-07-28 DIAGNOSIS — L97221 Non-pressure chronic ulcer of left calf limited to breakdown of skin: Secondary | ICD-10-CM | POA: Diagnosis not present

## 2015-07-28 DIAGNOSIS — I480 Paroxysmal atrial fibrillation: Secondary | ICD-10-CM | POA: Diagnosis not present

## 2015-07-28 DIAGNOSIS — L97211 Non-pressure chronic ulcer of right calf limited to breakdown of skin: Secondary | ICD-10-CM | POA: Diagnosis not present

## 2015-07-28 DIAGNOSIS — I872 Venous insufficiency (chronic) (peripheral): Secondary | ICD-10-CM | POA: Diagnosis not present

## 2015-07-28 DIAGNOSIS — E119 Type 2 diabetes mellitus without complications: Secondary | ICD-10-CM | POA: Diagnosis not present

## 2015-07-28 DIAGNOSIS — E785 Hyperlipidemia, unspecified: Secondary | ICD-10-CM | POA: Diagnosis not present

## 2015-08-02 DIAGNOSIS — L97211 Non-pressure chronic ulcer of right calf limited to breakdown of skin: Secondary | ICD-10-CM | POA: Diagnosis not present

## 2015-08-02 DIAGNOSIS — I480 Paroxysmal atrial fibrillation: Secondary | ICD-10-CM | POA: Diagnosis not present

## 2015-08-02 DIAGNOSIS — L97221 Non-pressure chronic ulcer of left calf limited to breakdown of skin: Secondary | ICD-10-CM | POA: Diagnosis not present

## 2015-08-02 DIAGNOSIS — I10 Essential (primary) hypertension: Secondary | ICD-10-CM | POA: Diagnosis not present

## 2015-08-02 DIAGNOSIS — I872 Venous insufficiency (chronic) (peripheral): Secondary | ICD-10-CM | POA: Diagnosis not present

## 2015-08-02 DIAGNOSIS — I2510 Atherosclerotic heart disease of native coronary artery without angina pectoris: Secondary | ICD-10-CM | POA: Diagnosis not present

## 2015-08-02 DIAGNOSIS — Z7984 Long term (current) use of oral hypoglycemic drugs: Secondary | ICD-10-CM | POA: Diagnosis not present

## 2015-08-02 DIAGNOSIS — E119 Type 2 diabetes mellitus without complications: Secondary | ICD-10-CM | POA: Diagnosis not present

## 2015-08-02 DIAGNOSIS — E785 Hyperlipidemia, unspecified: Secondary | ICD-10-CM | POA: Diagnosis not present

## 2015-08-05 DIAGNOSIS — I480 Paroxysmal atrial fibrillation: Secondary | ICD-10-CM | POA: Diagnosis not present

## 2015-08-05 DIAGNOSIS — I872 Venous insufficiency (chronic) (peripheral): Secondary | ICD-10-CM | POA: Diagnosis not present

## 2015-08-05 DIAGNOSIS — E785 Hyperlipidemia, unspecified: Secondary | ICD-10-CM | POA: Diagnosis not present

## 2015-08-05 DIAGNOSIS — Z7984 Long term (current) use of oral hypoglycemic drugs: Secondary | ICD-10-CM | POA: Diagnosis not present

## 2015-08-05 DIAGNOSIS — L97211 Non-pressure chronic ulcer of right calf limited to breakdown of skin: Secondary | ICD-10-CM | POA: Diagnosis not present

## 2015-08-05 DIAGNOSIS — L97221 Non-pressure chronic ulcer of left calf limited to breakdown of skin: Secondary | ICD-10-CM | POA: Diagnosis not present

## 2015-08-05 DIAGNOSIS — I10 Essential (primary) hypertension: Secondary | ICD-10-CM | POA: Diagnosis not present

## 2015-08-05 DIAGNOSIS — E119 Type 2 diabetes mellitus without complications: Secondary | ICD-10-CM | POA: Diagnosis not present

## 2015-08-05 DIAGNOSIS — I2510 Atherosclerotic heart disease of native coronary artery without angina pectoris: Secondary | ICD-10-CM | POA: Diagnosis not present

## 2015-08-09 DIAGNOSIS — I872 Venous insufficiency (chronic) (peripheral): Secondary | ICD-10-CM | POA: Diagnosis not present

## 2015-08-09 DIAGNOSIS — E119 Type 2 diabetes mellitus without complications: Secondary | ICD-10-CM | POA: Diagnosis not present

## 2015-08-09 DIAGNOSIS — I2510 Atherosclerotic heart disease of native coronary artery without angina pectoris: Secondary | ICD-10-CM | POA: Diagnosis not present

## 2015-08-09 DIAGNOSIS — L97221 Non-pressure chronic ulcer of left calf limited to breakdown of skin: Secondary | ICD-10-CM | POA: Diagnosis not present

## 2015-08-09 DIAGNOSIS — E785 Hyperlipidemia, unspecified: Secondary | ICD-10-CM | POA: Diagnosis not present

## 2015-08-09 DIAGNOSIS — I10 Essential (primary) hypertension: Secondary | ICD-10-CM | POA: Diagnosis not present

## 2015-08-09 DIAGNOSIS — L97211 Non-pressure chronic ulcer of right calf limited to breakdown of skin: Secondary | ICD-10-CM | POA: Diagnosis not present

## 2015-08-09 DIAGNOSIS — Z7984 Long term (current) use of oral hypoglycemic drugs: Secondary | ICD-10-CM | POA: Diagnosis not present

## 2015-08-09 DIAGNOSIS — I480 Paroxysmal atrial fibrillation: Secondary | ICD-10-CM | POA: Diagnosis not present

## 2015-08-12 DIAGNOSIS — L97221 Non-pressure chronic ulcer of left calf limited to breakdown of skin: Secondary | ICD-10-CM | POA: Diagnosis not present

## 2015-08-12 DIAGNOSIS — I480 Paroxysmal atrial fibrillation: Secondary | ICD-10-CM | POA: Diagnosis not present

## 2015-08-12 DIAGNOSIS — I2510 Atherosclerotic heart disease of native coronary artery without angina pectoris: Secondary | ICD-10-CM | POA: Diagnosis not present

## 2015-08-12 DIAGNOSIS — E785 Hyperlipidemia, unspecified: Secondary | ICD-10-CM | POA: Diagnosis not present

## 2015-08-12 DIAGNOSIS — Z7984 Long term (current) use of oral hypoglycemic drugs: Secondary | ICD-10-CM | POA: Diagnosis not present

## 2015-08-12 DIAGNOSIS — I872 Venous insufficiency (chronic) (peripheral): Secondary | ICD-10-CM | POA: Diagnosis not present

## 2015-08-12 DIAGNOSIS — L97211 Non-pressure chronic ulcer of right calf limited to breakdown of skin: Secondary | ICD-10-CM | POA: Diagnosis not present

## 2015-08-12 DIAGNOSIS — E119 Type 2 diabetes mellitus without complications: Secondary | ICD-10-CM | POA: Diagnosis not present

## 2015-08-12 DIAGNOSIS — I10 Essential (primary) hypertension: Secondary | ICD-10-CM | POA: Diagnosis not present

## 2015-08-16 DIAGNOSIS — E119 Type 2 diabetes mellitus without complications: Secondary | ICD-10-CM | POA: Diagnosis not present

## 2015-08-16 DIAGNOSIS — I872 Venous insufficiency (chronic) (peripheral): Secondary | ICD-10-CM | POA: Diagnosis not present

## 2015-08-16 DIAGNOSIS — Z7984 Long term (current) use of oral hypoglycemic drugs: Secondary | ICD-10-CM | POA: Diagnosis not present

## 2015-08-16 DIAGNOSIS — I2510 Atherosclerotic heart disease of native coronary artery without angina pectoris: Secondary | ICD-10-CM | POA: Diagnosis not present

## 2015-08-16 DIAGNOSIS — E785 Hyperlipidemia, unspecified: Secondary | ICD-10-CM | POA: Diagnosis not present

## 2015-08-16 DIAGNOSIS — I480 Paroxysmal atrial fibrillation: Secondary | ICD-10-CM | POA: Diagnosis not present

## 2015-08-16 DIAGNOSIS — L97211 Non-pressure chronic ulcer of right calf limited to breakdown of skin: Secondary | ICD-10-CM | POA: Diagnosis not present

## 2015-08-16 DIAGNOSIS — L97221 Non-pressure chronic ulcer of left calf limited to breakdown of skin: Secondary | ICD-10-CM | POA: Diagnosis not present

## 2015-08-16 DIAGNOSIS — I10 Essential (primary) hypertension: Secondary | ICD-10-CM | POA: Diagnosis not present

## 2015-08-19 DIAGNOSIS — L97211 Non-pressure chronic ulcer of right calf limited to breakdown of skin: Secondary | ICD-10-CM | POA: Diagnosis not present

## 2015-08-19 DIAGNOSIS — L97221 Non-pressure chronic ulcer of left calf limited to breakdown of skin: Secondary | ICD-10-CM | POA: Diagnosis not present

## 2015-08-19 DIAGNOSIS — I10 Essential (primary) hypertension: Secondary | ICD-10-CM | POA: Diagnosis not present

## 2015-08-19 DIAGNOSIS — I480 Paroxysmal atrial fibrillation: Secondary | ICD-10-CM | POA: Diagnosis not present

## 2015-08-19 DIAGNOSIS — E119 Type 2 diabetes mellitus without complications: Secondary | ICD-10-CM | POA: Diagnosis not present

## 2015-08-19 DIAGNOSIS — I872 Venous insufficiency (chronic) (peripheral): Secondary | ICD-10-CM | POA: Diagnosis not present

## 2015-08-19 DIAGNOSIS — Z7984 Long term (current) use of oral hypoglycemic drugs: Secondary | ICD-10-CM | POA: Diagnosis not present

## 2015-08-19 DIAGNOSIS — E785 Hyperlipidemia, unspecified: Secondary | ICD-10-CM | POA: Diagnosis not present

## 2015-08-19 DIAGNOSIS — I2510 Atherosclerotic heart disease of native coronary artery without angina pectoris: Secondary | ICD-10-CM | POA: Diagnosis not present

## 2015-08-23 DIAGNOSIS — I2510 Atherosclerotic heart disease of native coronary artery without angina pectoris: Secondary | ICD-10-CM | POA: Diagnosis not present

## 2015-08-23 DIAGNOSIS — I10 Essential (primary) hypertension: Secondary | ICD-10-CM | POA: Diagnosis not present

## 2015-08-23 DIAGNOSIS — L97211 Non-pressure chronic ulcer of right calf limited to breakdown of skin: Secondary | ICD-10-CM | POA: Diagnosis not present

## 2015-08-23 DIAGNOSIS — L97221 Non-pressure chronic ulcer of left calf limited to breakdown of skin: Secondary | ICD-10-CM | POA: Diagnosis not present

## 2015-08-23 DIAGNOSIS — E785 Hyperlipidemia, unspecified: Secondary | ICD-10-CM | POA: Diagnosis not present

## 2015-08-23 DIAGNOSIS — Z7984 Long term (current) use of oral hypoglycemic drugs: Secondary | ICD-10-CM | POA: Diagnosis not present

## 2015-08-23 DIAGNOSIS — I480 Paroxysmal atrial fibrillation: Secondary | ICD-10-CM | POA: Diagnosis not present

## 2015-08-23 DIAGNOSIS — E119 Type 2 diabetes mellitus without complications: Secondary | ICD-10-CM | POA: Diagnosis not present

## 2015-08-23 DIAGNOSIS — I872 Venous insufficiency (chronic) (peripheral): Secondary | ICD-10-CM | POA: Diagnosis not present

## 2015-08-24 DIAGNOSIS — L97211 Non-pressure chronic ulcer of right calf limited to breakdown of skin: Secondary | ICD-10-CM | POA: Diagnosis not present

## 2015-08-24 DIAGNOSIS — I480 Paroxysmal atrial fibrillation: Secondary | ICD-10-CM | POA: Diagnosis not present

## 2015-08-24 DIAGNOSIS — I10 Essential (primary) hypertension: Secondary | ICD-10-CM | POA: Diagnosis not present

## 2015-08-24 DIAGNOSIS — E785 Hyperlipidemia, unspecified: Secondary | ICD-10-CM | POA: Diagnosis not present

## 2015-08-24 DIAGNOSIS — I2510 Atherosclerotic heart disease of native coronary artery without angina pectoris: Secondary | ICD-10-CM | POA: Diagnosis not present

## 2015-08-24 DIAGNOSIS — L97221 Non-pressure chronic ulcer of left calf limited to breakdown of skin: Secondary | ICD-10-CM | POA: Diagnosis not present

## 2015-08-24 DIAGNOSIS — Z7984 Long term (current) use of oral hypoglycemic drugs: Secondary | ICD-10-CM | POA: Diagnosis not present

## 2015-08-24 DIAGNOSIS — E119 Type 2 diabetes mellitus without complications: Secondary | ICD-10-CM | POA: Diagnosis not present

## 2015-08-24 DIAGNOSIS — I872 Venous insufficiency (chronic) (peripheral): Secondary | ICD-10-CM | POA: Diagnosis not present

## 2015-08-26 DIAGNOSIS — L97211 Non-pressure chronic ulcer of right calf limited to breakdown of skin: Secondary | ICD-10-CM | POA: Diagnosis not present

## 2015-08-26 DIAGNOSIS — E785 Hyperlipidemia, unspecified: Secondary | ICD-10-CM | POA: Diagnosis not present

## 2015-08-26 DIAGNOSIS — I10 Essential (primary) hypertension: Secondary | ICD-10-CM | POA: Diagnosis not present

## 2015-08-26 DIAGNOSIS — L97221 Non-pressure chronic ulcer of left calf limited to breakdown of skin: Secondary | ICD-10-CM | POA: Diagnosis not present

## 2015-08-26 DIAGNOSIS — Z7984 Long term (current) use of oral hypoglycemic drugs: Secondary | ICD-10-CM | POA: Diagnosis not present

## 2015-08-26 DIAGNOSIS — E119 Type 2 diabetes mellitus without complications: Secondary | ICD-10-CM | POA: Diagnosis not present

## 2015-08-26 DIAGNOSIS — I2510 Atherosclerotic heart disease of native coronary artery without angina pectoris: Secondary | ICD-10-CM | POA: Diagnosis not present

## 2015-08-26 DIAGNOSIS — I480 Paroxysmal atrial fibrillation: Secondary | ICD-10-CM | POA: Diagnosis not present

## 2015-08-26 DIAGNOSIS — I872 Venous insufficiency (chronic) (peripheral): Secondary | ICD-10-CM | POA: Diagnosis not present

## 2015-08-30 DIAGNOSIS — I480 Paroxysmal atrial fibrillation: Secondary | ICD-10-CM | POA: Diagnosis not present

## 2015-08-30 DIAGNOSIS — I2510 Atherosclerotic heart disease of native coronary artery without angina pectoris: Secondary | ICD-10-CM | POA: Diagnosis not present

## 2015-08-30 DIAGNOSIS — L97211 Non-pressure chronic ulcer of right calf limited to breakdown of skin: Secondary | ICD-10-CM | POA: Diagnosis not present

## 2015-08-30 DIAGNOSIS — Z7984 Long term (current) use of oral hypoglycemic drugs: Secondary | ICD-10-CM | POA: Diagnosis not present

## 2015-08-30 DIAGNOSIS — I10 Essential (primary) hypertension: Secondary | ICD-10-CM | POA: Diagnosis not present

## 2015-08-30 DIAGNOSIS — I872 Venous insufficiency (chronic) (peripheral): Secondary | ICD-10-CM | POA: Diagnosis not present

## 2015-08-30 DIAGNOSIS — E119 Type 2 diabetes mellitus without complications: Secondary | ICD-10-CM | POA: Diagnosis not present

## 2015-08-30 DIAGNOSIS — E785 Hyperlipidemia, unspecified: Secondary | ICD-10-CM | POA: Diagnosis not present

## 2015-08-30 DIAGNOSIS — L97221 Non-pressure chronic ulcer of left calf limited to breakdown of skin: Secondary | ICD-10-CM | POA: Diagnosis not present

## 2015-09-02 DIAGNOSIS — E785 Hyperlipidemia, unspecified: Secondary | ICD-10-CM | POA: Diagnosis not present

## 2015-09-02 DIAGNOSIS — I480 Paroxysmal atrial fibrillation: Secondary | ICD-10-CM | POA: Diagnosis not present

## 2015-09-02 DIAGNOSIS — L97211 Non-pressure chronic ulcer of right calf limited to breakdown of skin: Secondary | ICD-10-CM | POA: Diagnosis not present

## 2015-09-02 DIAGNOSIS — I2510 Atherosclerotic heart disease of native coronary artery without angina pectoris: Secondary | ICD-10-CM | POA: Diagnosis not present

## 2015-09-02 DIAGNOSIS — E119 Type 2 diabetes mellitus without complications: Secondary | ICD-10-CM | POA: Diagnosis not present

## 2015-09-02 DIAGNOSIS — I10 Essential (primary) hypertension: Secondary | ICD-10-CM | POA: Diagnosis not present

## 2015-09-02 DIAGNOSIS — I872 Venous insufficiency (chronic) (peripheral): Secondary | ICD-10-CM | POA: Diagnosis not present

## 2015-09-02 DIAGNOSIS — L97221 Non-pressure chronic ulcer of left calf limited to breakdown of skin: Secondary | ICD-10-CM | POA: Diagnosis not present

## 2015-09-02 DIAGNOSIS — Z7984 Long term (current) use of oral hypoglycemic drugs: Secondary | ICD-10-CM | POA: Diagnosis not present

## 2015-09-06 DIAGNOSIS — E119 Type 2 diabetes mellitus without complications: Secondary | ICD-10-CM | POA: Diagnosis not present

## 2015-09-06 DIAGNOSIS — L97211 Non-pressure chronic ulcer of right calf limited to breakdown of skin: Secondary | ICD-10-CM | POA: Diagnosis not present

## 2015-09-06 DIAGNOSIS — I872 Venous insufficiency (chronic) (peripheral): Secondary | ICD-10-CM | POA: Diagnosis not present

## 2015-09-06 DIAGNOSIS — I2510 Atherosclerotic heart disease of native coronary artery without angina pectoris: Secondary | ICD-10-CM | POA: Diagnosis not present

## 2015-09-06 DIAGNOSIS — L97221 Non-pressure chronic ulcer of left calf limited to breakdown of skin: Secondary | ICD-10-CM | POA: Diagnosis not present

## 2015-09-06 DIAGNOSIS — Z7984 Long term (current) use of oral hypoglycemic drugs: Secondary | ICD-10-CM | POA: Diagnosis not present

## 2015-09-06 DIAGNOSIS — I480 Paroxysmal atrial fibrillation: Secondary | ICD-10-CM | POA: Diagnosis not present

## 2015-09-06 DIAGNOSIS — E785 Hyperlipidemia, unspecified: Secondary | ICD-10-CM | POA: Diagnosis not present

## 2015-09-06 DIAGNOSIS — I10 Essential (primary) hypertension: Secondary | ICD-10-CM | POA: Diagnosis not present

## 2015-09-09 DIAGNOSIS — L97221 Non-pressure chronic ulcer of left calf limited to breakdown of skin: Secondary | ICD-10-CM | POA: Diagnosis not present

## 2015-09-09 DIAGNOSIS — I2510 Atherosclerotic heart disease of native coronary artery without angina pectoris: Secondary | ICD-10-CM | POA: Diagnosis not present

## 2015-09-09 DIAGNOSIS — I10 Essential (primary) hypertension: Secondary | ICD-10-CM | POA: Diagnosis not present

## 2015-09-09 DIAGNOSIS — L97211 Non-pressure chronic ulcer of right calf limited to breakdown of skin: Secondary | ICD-10-CM | POA: Diagnosis not present

## 2015-09-09 DIAGNOSIS — I480 Paroxysmal atrial fibrillation: Secondary | ICD-10-CM | POA: Diagnosis not present

## 2015-09-09 DIAGNOSIS — Z7984 Long term (current) use of oral hypoglycemic drugs: Secondary | ICD-10-CM | POA: Diagnosis not present

## 2015-09-09 DIAGNOSIS — E119 Type 2 diabetes mellitus without complications: Secondary | ICD-10-CM | POA: Diagnosis not present

## 2015-09-09 DIAGNOSIS — E785 Hyperlipidemia, unspecified: Secondary | ICD-10-CM | POA: Diagnosis not present

## 2015-09-09 DIAGNOSIS — I872 Venous insufficiency (chronic) (peripheral): Secondary | ICD-10-CM | POA: Diagnosis not present

## 2015-09-13 DIAGNOSIS — I872 Venous insufficiency (chronic) (peripheral): Secondary | ICD-10-CM | POA: Diagnosis not present

## 2015-09-13 DIAGNOSIS — L97221 Non-pressure chronic ulcer of left calf limited to breakdown of skin: Secondary | ICD-10-CM | POA: Diagnosis not present

## 2015-09-13 DIAGNOSIS — Z7984 Long term (current) use of oral hypoglycemic drugs: Secondary | ICD-10-CM | POA: Diagnosis not present

## 2015-09-13 DIAGNOSIS — I480 Paroxysmal atrial fibrillation: Secondary | ICD-10-CM | POA: Diagnosis not present

## 2015-09-13 DIAGNOSIS — E119 Type 2 diabetes mellitus without complications: Secondary | ICD-10-CM | POA: Diagnosis not present

## 2015-09-13 DIAGNOSIS — I2510 Atherosclerotic heart disease of native coronary artery without angina pectoris: Secondary | ICD-10-CM | POA: Diagnosis not present

## 2015-09-13 DIAGNOSIS — L97211 Non-pressure chronic ulcer of right calf limited to breakdown of skin: Secondary | ICD-10-CM | POA: Diagnosis not present

## 2015-09-13 DIAGNOSIS — I10 Essential (primary) hypertension: Secondary | ICD-10-CM | POA: Diagnosis not present

## 2015-09-13 DIAGNOSIS — E785 Hyperlipidemia, unspecified: Secondary | ICD-10-CM | POA: Diagnosis not present

## 2015-09-14 DIAGNOSIS — N183 Chronic kidney disease, stage 3 (moderate): Secondary | ICD-10-CM | POA: Diagnosis not present

## 2015-09-14 DIAGNOSIS — E1151 Type 2 diabetes mellitus with diabetic peripheral angiopathy without gangrene: Secondary | ICD-10-CM | POA: Diagnosis not present

## 2015-09-14 DIAGNOSIS — Z6828 Body mass index (BMI) 28.0-28.9, adult: Secondary | ICD-10-CM | POA: Diagnosis not present

## 2015-09-14 DIAGNOSIS — M7989 Other specified soft tissue disorders: Secondary | ICD-10-CM | POA: Diagnosis not present

## 2015-09-14 DIAGNOSIS — I87319 Chronic venous hypertension (idiopathic) with ulcer of unspecified lower extremity: Secondary | ICD-10-CM | POA: Diagnosis not present

## 2015-09-14 DIAGNOSIS — I480 Paroxysmal atrial fibrillation: Secondary | ICD-10-CM | POA: Diagnosis not present

## 2015-09-14 DIAGNOSIS — L03116 Cellulitis of left lower limb: Secondary | ICD-10-CM | POA: Diagnosis not present

## 2015-09-16 DIAGNOSIS — I2510 Atherosclerotic heart disease of native coronary artery without angina pectoris: Secondary | ICD-10-CM | POA: Diagnosis not present

## 2015-09-16 DIAGNOSIS — I872 Venous insufficiency (chronic) (peripheral): Secondary | ICD-10-CM | POA: Diagnosis not present

## 2015-09-16 DIAGNOSIS — I10 Essential (primary) hypertension: Secondary | ICD-10-CM | POA: Diagnosis not present

## 2015-09-16 DIAGNOSIS — E785 Hyperlipidemia, unspecified: Secondary | ICD-10-CM | POA: Diagnosis not present

## 2015-09-16 DIAGNOSIS — I480 Paroxysmal atrial fibrillation: Secondary | ICD-10-CM | POA: Diagnosis not present

## 2015-09-16 DIAGNOSIS — E119 Type 2 diabetes mellitus without complications: Secondary | ICD-10-CM | POA: Diagnosis not present

## 2015-09-16 DIAGNOSIS — Z7984 Long term (current) use of oral hypoglycemic drugs: Secondary | ICD-10-CM | POA: Diagnosis not present

## 2015-09-16 DIAGNOSIS — L97211 Non-pressure chronic ulcer of right calf limited to breakdown of skin: Secondary | ICD-10-CM | POA: Diagnosis not present

## 2015-09-16 DIAGNOSIS — L97221 Non-pressure chronic ulcer of left calf limited to breakdown of skin: Secondary | ICD-10-CM | POA: Diagnosis not present

## 2015-09-19 DIAGNOSIS — L03116 Cellulitis of left lower limb: Secondary | ICD-10-CM | POA: Diagnosis not present

## 2015-09-19 DIAGNOSIS — E1151 Type 2 diabetes mellitus with diabetic peripheral angiopathy without gangrene: Secondary | ICD-10-CM | POA: Diagnosis not present

## 2015-09-19 DIAGNOSIS — E11621 Type 2 diabetes mellitus with foot ulcer: Secondary | ICD-10-CM | POA: Diagnosis not present

## 2015-09-19 DIAGNOSIS — Z6827 Body mass index (BMI) 27.0-27.9, adult: Secondary | ICD-10-CM | POA: Diagnosis not present

## 2015-09-19 DIAGNOSIS — I87319 Chronic venous hypertension (idiopathic) with ulcer of unspecified lower extremity: Secondary | ICD-10-CM | POA: Diagnosis not present

## 2015-09-19 DIAGNOSIS — N183 Chronic kidney disease, stage 3 (moderate): Secondary | ICD-10-CM | POA: Diagnosis not present

## 2015-09-20 DIAGNOSIS — I872 Venous insufficiency (chronic) (peripheral): Secondary | ICD-10-CM | POA: Diagnosis not present

## 2015-09-20 DIAGNOSIS — Z7984 Long term (current) use of oral hypoglycemic drugs: Secondary | ICD-10-CM | POA: Diagnosis not present

## 2015-09-20 DIAGNOSIS — L97211 Non-pressure chronic ulcer of right calf limited to breakdown of skin: Secondary | ICD-10-CM | POA: Diagnosis not present

## 2015-09-20 DIAGNOSIS — I2510 Atherosclerotic heart disease of native coronary artery without angina pectoris: Secondary | ICD-10-CM | POA: Diagnosis not present

## 2015-09-20 DIAGNOSIS — I480 Paroxysmal atrial fibrillation: Secondary | ICD-10-CM | POA: Diagnosis not present

## 2015-09-20 DIAGNOSIS — I10 Essential (primary) hypertension: Secondary | ICD-10-CM | POA: Diagnosis not present

## 2015-09-20 DIAGNOSIS — L97221 Non-pressure chronic ulcer of left calf limited to breakdown of skin: Secondary | ICD-10-CM | POA: Diagnosis not present

## 2015-09-20 DIAGNOSIS — E119 Type 2 diabetes mellitus without complications: Secondary | ICD-10-CM | POA: Diagnosis not present

## 2015-09-20 DIAGNOSIS — E785 Hyperlipidemia, unspecified: Secondary | ICD-10-CM | POA: Diagnosis not present

## 2015-09-21 DIAGNOSIS — E119 Type 2 diabetes mellitus without complications: Secondary | ICD-10-CM | POA: Diagnosis not present

## 2015-09-21 DIAGNOSIS — I10 Essential (primary) hypertension: Secondary | ICD-10-CM | POA: Diagnosis not present

## 2015-09-21 DIAGNOSIS — E785 Hyperlipidemia, unspecified: Secondary | ICD-10-CM | POA: Diagnosis not present

## 2015-09-21 DIAGNOSIS — L97221 Non-pressure chronic ulcer of left calf limited to breakdown of skin: Secondary | ICD-10-CM | POA: Diagnosis not present

## 2015-09-21 DIAGNOSIS — Z7984 Long term (current) use of oral hypoglycemic drugs: Secondary | ICD-10-CM | POA: Diagnosis not present

## 2015-09-21 DIAGNOSIS — L97211 Non-pressure chronic ulcer of right calf limited to breakdown of skin: Secondary | ICD-10-CM | POA: Diagnosis not present

## 2015-09-21 DIAGNOSIS — I872 Venous insufficiency (chronic) (peripheral): Secondary | ICD-10-CM | POA: Diagnosis not present

## 2015-09-21 DIAGNOSIS — I480 Paroxysmal atrial fibrillation: Secondary | ICD-10-CM | POA: Diagnosis not present

## 2015-09-21 DIAGNOSIS — I2510 Atherosclerotic heart disease of native coronary artery without angina pectoris: Secondary | ICD-10-CM | POA: Diagnosis not present

## 2015-09-22 DIAGNOSIS — L97221 Non-pressure chronic ulcer of left calf limited to breakdown of skin: Secondary | ICD-10-CM | POA: Diagnosis not present

## 2015-09-22 DIAGNOSIS — L97211 Non-pressure chronic ulcer of right calf limited to breakdown of skin: Secondary | ICD-10-CM | POA: Diagnosis not present

## 2015-09-22 DIAGNOSIS — L03116 Cellulitis of left lower limb: Secondary | ICD-10-CM | POA: Diagnosis not present

## 2015-09-22 DIAGNOSIS — I872 Venous insufficiency (chronic) (peripheral): Secondary | ICD-10-CM | POA: Diagnosis not present

## 2015-09-23 DIAGNOSIS — L97221 Non-pressure chronic ulcer of left calf limited to breakdown of skin: Secondary | ICD-10-CM | POA: Diagnosis not present

## 2015-09-23 DIAGNOSIS — E119 Type 2 diabetes mellitus without complications: Secondary | ICD-10-CM | POA: Diagnosis not present

## 2015-09-23 DIAGNOSIS — I2510 Atherosclerotic heart disease of native coronary artery without angina pectoris: Secondary | ICD-10-CM | POA: Diagnosis not present

## 2015-09-23 DIAGNOSIS — I480 Paroxysmal atrial fibrillation: Secondary | ICD-10-CM | POA: Diagnosis not present

## 2015-09-23 DIAGNOSIS — L97211 Non-pressure chronic ulcer of right calf limited to breakdown of skin: Secondary | ICD-10-CM | POA: Diagnosis not present

## 2015-09-23 DIAGNOSIS — I872 Venous insufficiency (chronic) (peripheral): Secondary | ICD-10-CM | POA: Diagnosis not present

## 2015-09-23 DIAGNOSIS — E785 Hyperlipidemia, unspecified: Secondary | ICD-10-CM | POA: Diagnosis not present

## 2015-09-23 DIAGNOSIS — L03116 Cellulitis of left lower limb: Secondary | ICD-10-CM | POA: Diagnosis not present

## 2015-09-23 DIAGNOSIS — I10 Essential (primary) hypertension: Secondary | ICD-10-CM | POA: Diagnosis not present

## 2015-09-27 DIAGNOSIS — L97221 Non-pressure chronic ulcer of left calf limited to breakdown of skin: Secondary | ICD-10-CM | POA: Diagnosis not present

## 2015-09-27 DIAGNOSIS — I872 Venous insufficiency (chronic) (peripheral): Secondary | ICD-10-CM | POA: Diagnosis not present

## 2015-09-27 DIAGNOSIS — L03116 Cellulitis of left lower limb: Secondary | ICD-10-CM | POA: Diagnosis not present

## 2015-09-27 DIAGNOSIS — I480 Paroxysmal atrial fibrillation: Secondary | ICD-10-CM | POA: Diagnosis not present

## 2015-09-27 DIAGNOSIS — L97211 Non-pressure chronic ulcer of right calf limited to breakdown of skin: Secondary | ICD-10-CM | POA: Diagnosis not present

## 2015-09-27 DIAGNOSIS — I2510 Atherosclerotic heart disease of native coronary artery without angina pectoris: Secondary | ICD-10-CM | POA: Diagnosis not present

## 2015-09-27 DIAGNOSIS — E119 Type 2 diabetes mellitus without complications: Secondary | ICD-10-CM | POA: Diagnosis not present

## 2015-09-27 DIAGNOSIS — I10 Essential (primary) hypertension: Secondary | ICD-10-CM | POA: Diagnosis not present

## 2015-09-27 DIAGNOSIS — E785 Hyperlipidemia, unspecified: Secondary | ICD-10-CM | POA: Diagnosis not present

## 2015-09-29 DIAGNOSIS — E785 Hyperlipidemia, unspecified: Secondary | ICD-10-CM | POA: Diagnosis not present

## 2015-09-29 DIAGNOSIS — I2510 Atherosclerotic heart disease of native coronary artery without angina pectoris: Secondary | ICD-10-CM | POA: Diagnosis not present

## 2015-09-29 DIAGNOSIS — I872 Venous insufficiency (chronic) (peripheral): Secondary | ICD-10-CM | POA: Diagnosis not present

## 2015-09-29 DIAGNOSIS — L97221 Non-pressure chronic ulcer of left calf limited to breakdown of skin: Secondary | ICD-10-CM | POA: Diagnosis not present

## 2015-09-29 DIAGNOSIS — L03116 Cellulitis of left lower limb: Secondary | ICD-10-CM | POA: Diagnosis not present

## 2015-09-29 DIAGNOSIS — I10 Essential (primary) hypertension: Secondary | ICD-10-CM | POA: Diagnosis not present

## 2015-09-29 DIAGNOSIS — I480 Paroxysmal atrial fibrillation: Secondary | ICD-10-CM | POA: Diagnosis not present

## 2015-09-29 DIAGNOSIS — L97211 Non-pressure chronic ulcer of right calf limited to breakdown of skin: Secondary | ICD-10-CM | POA: Diagnosis not present

## 2015-09-29 DIAGNOSIS — E119 Type 2 diabetes mellitus without complications: Secondary | ICD-10-CM | POA: Diagnosis not present

## 2015-10-04 DIAGNOSIS — E119 Type 2 diabetes mellitus without complications: Secondary | ICD-10-CM | POA: Diagnosis not present

## 2015-10-04 DIAGNOSIS — I480 Paroxysmal atrial fibrillation: Secondary | ICD-10-CM | POA: Diagnosis not present

## 2015-10-04 DIAGNOSIS — L97221 Non-pressure chronic ulcer of left calf limited to breakdown of skin: Secondary | ICD-10-CM | POA: Diagnosis not present

## 2015-10-04 DIAGNOSIS — L97211 Non-pressure chronic ulcer of right calf limited to breakdown of skin: Secondary | ICD-10-CM | POA: Diagnosis not present

## 2015-10-04 DIAGNOSIS — E785 Hyperlipidemia, unspecified: Secondary | ICD-10-CM | POA: Diagnosis not present

## 2015-10-04 DIAGNOSIS — I2510 Atherosclerotic heart disease of native coronary artery without angina pectoris: Secondary | ICD-10-CM | POA: Diagnosis not present

## 2015-10-04 DIAGNOSIS — I10 Essential (primary) hypertension: Secondary | ICD-10-CM | POA: Diagnosis not present

## 2015-10-04 DIAGNOSIS — L03116 Cellulitis of left lower limb: Secondary | ICD-10-CM | POA: Diagnosis not present

## 2015-10-04 DIAGNOSIS — I872 Venous insufficiency (chronic) (peripheral): Secondary | ICD-10-CM | POA: Diagnosis not present

## 2015-10-05 ENCOUNTER — Ambulatory Visit (HOSPITAL_COMMUNITY)
Admission: RE | Admit: 2015-10-05 | Discharge: 2015-10-05 | Disposition: A | Discharge status: Home / Self Care | Payer: Commercial Managed Care - HMO | Source: Ambulatory Visit | Attending: Surgery | Admitting: Surgery

## 2015-10-05 ENCOUNTER — Other Ambulatory Visit: Payer: Self-pay | Admitting: Surgery

## 2015-10-05 ENCOUNTER — Encounter (HOSPITAL_BASED_OUTPATIENT_CLINIC_OR_DEPARTMENT_OTHER): Payer: Commercial Managed Care - HMO | Attending: Surgery

## 2015-10-05 DIAGNOSIS — I480 Paroxysmal atrial fibrillation: Secondary | ICD-10-CM | POA: Diagnosis not present

## 2015-10-05 DIAGNOSIS — E785 Hyperlipidemia, unspecified: Secondary | ICD-10-CM | POA: Diagnosis not present

## 2015-10-05 DIAGNOSIS — L97521 Non-pressure chronic ulcer of other part of left foot limited to breakdown of skin: Secondary | ICD-10-CM | POA: Insufficient documentation

## 2015-10-05 DIAGNOSIS — I872 Venous insufficiency (chronic) (peripheral): Secondary | ICD-10-CM | POA: Insufficient documentation

## 2015-10-05 DIAGNOSIS — R937 Abnormal findings on diagnostic imaging of other parts of musculoskeletal system: Secondary | ICD-10-CM | POA: Insufficient documentation

## 2015-10-05 DIAGNOSIS — I509 Heart failure, unspecified: Secondary | ICD-10-CM | POA: Insufficient documentation

## 2015-10-05 DIAGNOSIS — I890 Lymphedema, not elsewhere classified: Secondary | ICD-10-CM | POA: Diagnosis not present

## 2015-10-05 DIAGNOSIS — I252 Old myocardial infarction: Secondary | ICD-10-CM | POA: Insufficient documentation

## 2015-10-05 DIAGNOSIS — L03032 Cellulitis of left toe: Secondary | ICD-10-CM | POA: Diagnosis not present

## 2015-10-05 DIAGNOSIS — E11621 Type 2 diabetes mellitus with foot ulcer: Secondary | ICD-10-CM | POA: Diagnosis not present

## 2015-10-05 DIAGNOSIS — L97522 Non-pressure chronic ulcer of other part of left foot with fat layer exposed: Secondary | ICD-10-CM | POA: Diagnosis not present

## 2015-10-05 DIAGNOSIS — L97529 Non-pressure chronic ulcer of other part of left foot with unspecified severity: Secondary | ICD-10-CM | POA: Diagnosis not present

## 2015-10-05 DIAGNOSIS — M869 Osteomyelitis, unspecified: Secondary | ICD-10-CM

## 2015-10-05 DIAGNOSIS — Z87891 Personal history of nicotine dependence: Secondary | ICD-10-CM | POA: Diagnosis not present

## 2015-10-05 DIAGNOSIS — L97512 Non-pressure chronic ulcer of other part of right foot with fat layer exposed: Secondary | ICD-10-CM | POA: Diagnosis not present

## 2015-10-05 DIAGNOSIS — E1151 Type 2 diabetes mellitus with diabetic peripheral angiopathy without gangrene: Secondary | ICD-10-CM | POA: Insufficient documentation

## 2015-10-05 DIAGNOSIS — L97511 Non-pressure chronic ulcer of other part of right foot limited to breakdown of skin: Secondary | ICD-10-CM | POA: Insufficient documentation

## 2015-10-05 DIAGNOSIS — G4730 Sleep apnea, unspecified: Secondary | ICD-10-CM | POA: Insufficient documentation

## 2015-10-05 DIAGNOSIS — I2510 Atherosclerotic heart disease of native coronary artery without angina pectoris: Secondary | ICD-10-CM | POA: Insufficient documentation

## 2015-10-06 ENCOUNTER — Encounter (HOSPITAL_BASED_OUTPATIENT_CLINIC_OR_DEPARTMENT_OTHER): Payer: Commercial Managed Care - HMO

## 2015-10-07 DIAGNOSIS — L03116 Cellulitis of left lower limb: Secondary | ICD-10-CM | POA: Diagnosis not present

## 2015-10-07 DIAGNOSIS — E785 Hyperlipidemia, unspecified: Secondary | ICD-10-CM | POA: Diagnosis not present

## 2015-10-07 DIAGNOSIS — I10 Essential (primary) hypertension: Secondary | ICD-10-CM | POA: Diagnosis not present

## 2015-10-07 DIAGNOSIS — E119 Type 2 diabetes mellitus without complications: Secondary | ICD-10-CM | POA: Diagnosis not present

## 2015-10-07 DIAGNOSIS — L97221 Non-pressure chronic ulcer of left calf limited to breakdown of skin: Secondary | ICD-10-CM | POA: Diagnosis not present

## 2015-10-07 DIAGNOSIS — L97211 Non-pressure chronic ulcer of right calf limited to breakdown of skin: Secondary | ICD-10-CM | POA: Diagnosis not present

## 2015-10-07 DIAGNOSIS — I872 Venous insufficiency (chronic) (peripheral): Secondary | ICD-10-CM | POA: Diagnosis not present

## 2015-10-07 DIAGNOSIS — I480 Paroxysmal atrial fibrillation: Secondary | ICD-10-CM | POA: Diagnosis not present

## 2015-10-07 DIAGNOSIS — I2510 Atherosclerotic heart disease of native coronary artery without angina pectoris: Secondary | ICD-10-CM | POA: Diagnosis not present

## 2015-10-09 ENCOUNTER — Encounter (HOSPITAL_COMMUNITY): Payer: Self-pay | Admitting: Emergency Medicine

## 2015-10-09 ENCOUNTER — Inpatient Hospital Stay (HOSPITAL_COMMUNITY)
Admission: EM | Admit: 2015-10-09 | Discharge: 2015-10-14 | DRG: 377 | Disposition: A | Discharge status: Home Health | Payer: Commercial Managed Care - HMO | Attending: Internal Medicine | Admitting: Internal Medicine

## 2015-10-09 DIAGNOSIS — Z955 Presence of coronary angioplasty implant and graft: Secondary | ICD-10-CM | POA: Diagnosis not present

## 2015-10-09 DIAGNOSIS — L97529 Non-pressure chronic ulcer of other part of left foot with unspecified severity: Secondary | ICD-10-CM | POA: Diagnosis present

## 2015-10-09 DIAGNOSIS — K921 Melena: Secondary | ICD-10-CM | POA: Diagnosis present

## 2015-10-09 DIAGNOSIS — D62 Acute posthemorrhagic anemia: Secondary | ICD-10-CM | POA: Diagnosis present

## 2015-10-09 DIAGNOSIS — L97522 Non-pressure chronic ulcer of other part of left foot with fat layer exposed: Secondary | ICD-10-CM

## 2015-10-09 DIAGNOSIS — Z9189 Other specified personal risk factors, not elsewhere classified: Secondary | ICD-10-CM

## 2015-10-09 DIAGNOSIS — I480 Paroxysmal atrial fibrillation: Secondary | ICD-10-CM

## 2015-10-09 DIAGNOSIS — K922 Gastrointestinal hemorrhage, unspecified: Secondary | ICD-10-CM | POA: Diagnosis present

## 2015-10-09 DIAGNOSIS — I341 Nonrheumatic mitral (valve) prolapse: Secondary | ICD-10-CM | POA: Diagnosis present

## 2015-10-09 DIAGNOSIS — L03032 Cellulitis of left toe: Secondary | ICD-10-CM | POA: Diagnosis present

## 2015-10-09 DIAGNOSIS — E119 Type 2 diabetes mellitus without complications: Secondary | ICD-10-CM

## 2015-10-09 DIAGNOSIS — D122 Benign neoplasm of ascending colon: Secondary | ICD-10-CM | POA: Diagnosis present

## 2015-10-09 DIAGNOSIS — Z79899 Other long term (current) drug therapy: Secondary | ICD-10-CM

## 2015-10-09 DIAGNOSIS — M1990 Unspecified osteoarthritis, unspecified site: Secondary | ICD-10-CM | POA: Diagnosis present

## 2015-10-09 DIAGNOSIS — Z7984 Long term (current) use of oral hypoglycemic drugs: Secondary | ICD-10-CM | POA: Diagnosis not present

## 2015-10-09 DIAGNOSIS — R269 Unspecified abnormalities of gait and mobility: Secondary | ICD-10-CM | POA: Diagnosis not present

## 2015-10-09 DIAGNOSIS — I2510 Atherosclerotic heart disease of native coronary artery without angina pectoris: Secondary | ICD-10-CM | POA: Diagnosis present

## 2015-10-09 DIAGNOSIS — D123 Benign neoplasm of transverse colon: Secondary | ICD-10-CM | POA: Diagnosis present

## 2015-10-09 DIAGNOSIS — Z9181 History of falling: Secondary | ICD-10-CM

## 2015-10-09 DIAGNOSIS — I482 Chronic atrial fibrillation: Secondary | ICD-10-CM | POA: Diagnosis present

## 2015-10-09 DIAGNOSIS — I8310 Varicose veins of unspecified lower extremity with inflammation: Secondary | ICD-10-CM | POA: Diagnosis not present

## 2015-10-09 DIAGNOSIS — K649 Unspecified hemorrhoids: Secondary | ICD-10-CM | POA: Diagnosis not present

## 2015-10-09 DIAGNOSIS — I5033 Acute on chronic diastolic (congestive) heart failure: Secondary | ICD-10-CM | POA: Diagnosis present

## 2015-10-09 DIAGNOSIS — E11621 Type 2 diabetes mellitus with foot ulcer: Secondary | ICD-10-CM | POA: Diagnosis present

## 2015-10-09 DIAGNOSIS — K635 Polyp of colon: Secondary | ICD-10-CM | POA: Diagnosis not present

## 2015-10-09 DIAGNOSIS — R0602 Shortness of breath: Secondary | ICD-10-CM | POA: Diagnosis not present

## 2015-10-09 DIAGNOSIS — I872 Venous insufficiency (chronic) (peripheral): Secondary | ICD-10-CM | POA: Diagnosis present

## 2015-10-09 DIAGNOSIS — I252 Old myocardial infarction: Secondary | ICD-10-CM

## 2015-10-09 DIAGNOSIS — K5730 Diverticulosis of large intestine without perforation or abscess without bleeding: Secondary | ICD-10-CM | POA: Diagnosis present

## 2015-10-09 DIAGNOSIS — Z8673 Personal history of transient ischemic attack (TIA), and cerebral infarction without residual deficits: Secondary | ICD-10-CM | POA: Diagnosis not present

## 2015-10-09 DIAGNOSIS — I878 Other specified disorders of veins: Secondary | ICD-10-CM | POA: Diagnosis present

## 2015-10-09 DIAGNOSIS — Z87891 Personal history of nicotine dependence: Secondary | ICD-10-CM | POA: Diagnosis not present

## 2015-10-09 DIAGNOSIS — E118 Type 2 diabetes mellitus with unspecified complications: Secondary | ICD-10-CM

## 2015-10-09 DIAGNOSIS — G4733 Obstructive sleep apnea (adult) (pediatric): Secondary | ICD-10-CM | POA: Diagnosis present

## 2015-10-09 DIAGNOSIS — K648 Other hemorrhoids: Secondary | ICD-10-CM | POA: Diagnosis present

## 2015-10-09 DIAGNOSIS — E785 Hyperlipidemia, unspecified: Secondary | ICD-10-CM | POA: Diagnosis present

## 2015-10-09 DIAGNOSIS — I4892 Unspecified atrial flutter: Secondary | ICD-10-CM | POA: Diagnosis present

## 2015-10-09 DIAGNOSIS — I8312 Varicose veins of left lower extremity with inflammation: Secondary | ICD-10-CM | POA: Diagnosis not present

## 2015-10-09 DIAGNOSIS — D649 Anemia, unspecified: Secondary | ICD-10-CM | POA: Diagnosis not present

## 2015-10-09 HISTORY — DX: Synovial cyst of popliteal space (Baker), unspecified knee: M71.20

## 2015-10-09 LAB — TYPE AND SCREEN
ABO/RH(D): O NEG
Antibody Screen: NEGATIVE

## 2015-10-09 LAB — CBC WITH DIFFERENTIAL/PLATELET
BASOS PCT: 0 %
Basophils Absolute: 0 10*3/uL (ref 0.0–0.1)
EOS ABS: 0.1 10*3/uL (ref 0.0–0.7)
EOS PCT: 1 %
HCT: 33.2 % — ABNORMAL LOW (ref 36.0–46.0)
HEMOGLOBIN: 10.5 g/dL — AB (ref 12.0–15.0)
Lymphocytes Relative: 12 %
Lymphs Abs: 1.1 10*3/uL (ref 0.7–4.0)
MCH: 32.1 pg (ref 26.0–34.0)
MCHC: 31.6 g/dL (ref 30.0–36.0)
MCV: 101.5 fL — ABNORMAL HIGH (ref 78.0–100.0)
Monocytes Absolute: 0.7 10*3/uL (ref 0.1–1.0)
Monocytes Relative: 7 %
NEUTROS PCT: 80 %
Neutro Abs: 7 10*3/uL (ref 1.7–7.7)
PLATELETS: 230 10*3/uL (ref 150–400)
RBC: 3.27 MIL/uL — AB (ref 3.87–5.11)
RDW: 16.1 % — ABNORMAL HIGH (ref 11.5–15.5)
WBC: 8.8 10*3/uL (ref 4.0–10.5)

## 2015-10-09 LAB — COMPREHENSIVE METABOLIC PANEL
ALBUMIN: 2.6 g/dL — AB (ref 3.5–5.0)
ALK PHOS: 76 U/L (ref 38–126)
ALT: 33 U/L (ref 14–54)
ANION GAP: 11 (ref 5–15)
AST: 45 U/L — ABNORMAL HIGH (ref 15–41)
BUN: 18 mg/dL (ref 6–20)
CHLORIDE: 101 mmol/L (ref 101–111)
CO2: 24 mmol/L (ref 22–32)
Calcium: 8.5 mg/dL — ABNORMAL LOW (ref 8.9–10.3)
Creatinine, Ser: 1.11 mg/dL — ABNORMAL HIGH (ref 0.44–1.00)
GFR calc Af Amer: 52 mL/min — ABNORMAL LOW (ref >60)
GFR calc non Af Amer: 45 mL/min — ABNORMAL LOW (ref >60)
GLUCOSE: 165 mg/dL — AB (ref 65–99)
POTASSIUM: 3.5 mmol/L (ref 3.5–5.1)
SODIUM: 136 mmol/L (ref 135–145)
Total Bilirubin: 0.9 mg/dL (ref 0.3–1.2)
Total Protein: 5.6 g/dL — ABNORMAL LOW (ref 6.5–8.1)

## 2015-10-09 LAB — GLUCOSE, CAPILLARY
GLUCOSE-CAPILLARY: 122 mg/dL — AB (ref 65–99)
GLUCOSE-CAPILLARY: 108 mg/dL — AB (ref 65–99)
GLUCOSE-CAPILLARY: 109 mg/dL — AB (ref 65–99)
Glucose-Capillary: 173 mg/dL — ABNORMAL HIGH (ref 65–99)

## 2015-10-09 LAB — CBC
HCT: 30.5 % — ABNORMAL LOW (ref 36.0–46.0)
HEMATOCRIT: 31 % — AB (ref 36.0–46.0)
HEMOGLOBIN: 10 g/dL — AB (ref 12.0–15.0)
Hemoglobin: 9.7 g/dL — ABNORMAL LOW (ref 12.0–15.0)
MCH: 32.7 pg (ref 26.0–34.0)
MCH: 32.4 pg (ref 26.0–34.0)
MCHC: 32.3 g/dL (ref 30.0–36.0)
MCHC: 31.8 g/dL (ref 30.0–36.0)
MCV: 101.3 fL — AB (ref 78.0–100.0)
MCV: 102 fL — ABNORMAL HIGH (ref 78.0–100.0)
PLATELETS: 198 10*3/uL (ref 150–400)
Platelets: 232 10*3/uL (ref 150–400)
RBC: 3.06 MIL/uL — ABNORMAL LOW (ref 3.87–5.11)
RBC: 2.99 MIL/uL — ABNORMAL LOW (ref 3.87–5.11)
RDW: 16.3 % — ABNORMAL HIGH (ref 11.5–15.5)
RDW: 16.4 % — AB (ref 11.5–15.5)
WBC: 8 10*3/uL (ref 4.0–10.5)
WBC: 6 10*3/uL (ref 4.0–10.5)

## 2015-10-09 LAB — IRON AND TIBC
Iron: 27 ug/dL — ABNORMAL LOW (ref 28–170)
SATURATION RATIOS: 11 % (ref 10.4–31.8)
TIBC: 256 ug/dL (ref 250–450)
UIBC: 229 ug/dL

## 2015-10-09 LAB — RETICULOCYTES
RBC.: 3.29 MIL/uL — AB (ref 3.87–5.11)
RETIC CT PCT: 1.5 % (ref 0.4–3.1)
Retic Count, Absolute: 49.4 10*3/uL (ref 19.0–186.0)

## 2015-10-09 LAB — APTT: aPTT: 73 seconds — ABNORMAL HIGH (ref 24–37)

## 2015-10-09 LAB — FERRITIN: Ferritin: 295 ng/mL (ref 11–307)

## 2015-10-09 LAB — ABO/RH: ABO/RH(D): O NEG

## 2015-10-09 LAB — TSH: TSH: 3.274 u[IU]/mL (ref 0.350–4.500)

## 2015-10-09 LAB — PROTIME-INR
INR: 2.83 — AB (ref 0.00–1.49)
PROTHROMBIN TIME: 29.3 s — AB (ref 11.6–15.2)

## 2015-10-09 LAB — FOLATE: FOLATE: 12.9 ng/mL (ref >5.9)

## 2015-10-09 LAB — VITAMIN B12: VITAMIN B 12: 266 pg/mL (ref 180–914)

## 2015-10-09 MED ORDER — METOPROLOL TARTRATE 1 MG/ML IV SOLN
5.0000 mg | INTRAVENOUS | Status: DC | PRN
  Administered 2015-10-11: 5 mg via INTRAVENOUS
  Filled 2015-10-09: qty 5

## 2015-10-09 MED ORDER — ACETAMINOPHEN 325 MG PO TABS
650.0000 mg | ORAL_TABLET | Freq: Four times a day (QID) | ORAL | Status: DC | PRN
  Administered 2015-10-14: 650 mg via ORAL
  Filled 2015-10-09: qty 2

## 2015-10-09 MED ORDER — ACETAMINOPHEN 650 MG RE SUPP: 650.0000 mg | Freq: Four times a day (QID) | RECTAL | Status: DC | PRN

## 2015-10-09 MED ORDER — SODIUM CHLORIDE 0.9 % IV BOLUS (SEPSIS)
1000.0000 mL | Freq: Once | INTRAVENOUS | Status: AC
  Administered 2015-10-09: 1000 mL via INTRAVENOUS

## 2015-10-09 MED ORDER — CLINDAMYCIN PHOSPHATE 300 MG/50ML IV SOLN
300.0000 mg | Freq: Two times a day (BID) | INTRAVENOUS | Status: DC
  Administered 2015-10-09 – 2015-10-11 (×5): 300 mg via INTRAVENOUS
  Filled 2015-10-09 (×6): qty 50

## 2015-10-09 MED ORDER — INSULIN ASPART 100 UNIT/ML ~~LOC~~ SOLN
0.0000 [IU] | SUBCUTANEOUS | Status: DC
  Administered 2015-10-09: 2 [IU] via SUBCUTANEOUS
  Administered 2015-10-10 – 2015-10-14 (×11): 1 [IU] via SUBCUTANEOUS

## 2015-10-09 MED ORDER — PANTOPRAZOLE SODIUM 40 MG IV SOLR
40.0000 mg | Freq: Two times a day (BID) | INTRAVENOUS | Status: DC
  Administered 2015-10-09 (×2): 40 mg via INTRAVENOUS
  Filled 2015-10-09 (×2): qty 40

## 2015-10-09 MED ORDER — ONDANSETRON HCL 4 MG/2ML IJ SOLN: 4.0000 mg | Freq: Four times a day (QID) | INTRAMUSCULAR | Status: DC | PRN

## 2015-10-09 MED ORDER — AMIODARONE HCL 200 MG PO TABS
200.0000 mg | ORAL_TABLET | Freq: Every day | ORAL | Status: DC
  Administered 2015-10-09 – 2015-10-14 (×6): 200 mg via ORAL
  Filled 2015-10-09 (×7): qty 1

## 2015-10-09 MED ORDER — ONDANSETRON HCL 4 MG PO TABS: 4.0000 mg | ORAL_TABLET | Freq: Four times a day (QID) | ORAL | Status: DC | PRN

## 2015-10-09 MED ORDER — SODIUM CHLORIDE 0.9 % IV SOLN
INTRAVENOUS | Status: DC
  Administered 2015-10-09: 11:00:00 via INTRAVENOUS

## 2015-10-09 MED ORDER — SODIUM CHLORIDE 0.9% FLUSH
3.0000 mL | Freq: Two times a day (BID) | INTRAVENOUS | Status: DC
  Administered 2015-10-10: 3 mL via INTRAVENOUS
  Administered 2015-10-13: 10 mL via INTRAVENOUS
  Administered 2015-10-13 – 2015-10-14 (×2): 3 mL via INTRAVENOUS

## 2015-10-09 MED ORDER — METOPROLOL SUCCINATE ER 25 MG PO TB24
25.0000 mg | ORAL_TABLET | Freq: Two times a day (BID) | ORAL | Status: DC
  Filled 2015-10-09 (×3): qty 1

## 2015-10-09 NOTE — Consult Note (Signed)
Consultation  Referring Provider: [facility] Primary Care Physician:  Haywood Pao, MD Primary Gastroenterologist:  None/unassigned  Reason for Consultation:   Rectal bleeding  HPI: Shannon [name] is a 82 y.o. female no prior GI history who is admitted through the emergency room this morning with complaints of onset of rectal bleeding at about 2:30 AM today. Patient says she was placed on clindamycin last week by a vascular physician she has been seeing for venous disease bilateral lower extremities and Apparently there is concern about osteomyelitis in her toe. She says since she has been on vancomycin she's been having 3-4 loose bowel movements per day. She had a bowel movement about 2:30 AM which appeared brown after the bowel movement she started oozing bright red blood from her rectum. She had no associated abdominal pain or rectal pain. Since admission to the hospital she has had one further episode reported by the nurse as a soft brown stool followed by bright red blood dripping into the commode. Patient has history of coronary artery disease status post MI 2 and has a drug-eluting stent which was placed in 2013. She has been on Pradaxa . Also with history of atrial fib flutter, congestive heart failure with EF of 5055%, and obstructive sleep apnea and adult-onset diabetes mellitus.  Hemoglobin on admission 10.5 hematocrit 33 MCV of 101 to be BC 8.8 ProTime 29.3, INR 2.83, creatinine 1.1 even 11 LFTs normal with the exception of AST of 45. Patient states that her hemoglobin has been running in the 10 range over the past 6 months.  She has not had any prior colonoscopy. She states over the past few months she has had no GI issues or changes in bowel habits etc.   Past Medical History  Diagnosis Date  . CAD (coronary artery disease)     a. 05/2006 Cath/PCI: LAD 12m, D1 40 ost, LCX nl, OM1 95 (3.0x20 Taxus DES), RCA nl, EF 40% w/ lat AK;  04/2007 Ex MV: minimal lat  ischemia in area of prior infarct->low risk-> med Rx.  [name] Hyperlipidemia   . Diabetes mellitus   . MVP (mitral valve prolapse)     a. 10/2003 Echo: nl LV fxn, mild MR/TR/AS  . Dysrhythmia     atrial fib& flutter  . Myocardial infarction ([place])   . Sleep apnea   . Transient ischemic attack   . Arthritis   . Baker's cyst of knee     Past Surgical History  Procedure Laterality Date  . Tonsillectomy    . Coronary angioplasty      Status post PTCA and stenting of the first obtuse marginal-05/13/2006. We placed a 3.0 x 20 mm Taxus stent. It was post dilated using a 3.25 mm noncompliant balloon up to 14 atmospheres  . Cataracts    . Atrial flutter ablation N/A 10/09/2012    Procedure: ATRIAL FLUTTER ABLATION;  Surgeon: Thompson Grayer, MD;  Location: [facility] CATH LAB;  Service: Cardiovascular;  Laterality: N/A;    Prior to Admission medications   Medication Sig Start Date End Date Taking? Authorizing Provider  amiodarone (PACERONE) 200 MG tablet Take 1 tablet (200 mg total) by mouth daily. 07/22/15  Yes Thayer Headings, MD  atorvastatin (LIPITOR) 20 MG tablet TAKE ONE TABLET BY MOUTH ONE TIME DAILY 04/08/15  Yes Thayer Headings, MD  clindamycin (CLEOCIN) 300 MG capsule Take 600 mg by mouth 2 (two) times daily.  10/05/15  Yes Historical Provider, MD  furosemide (LASIX) 20 MG  tablet Take 20 mg by mouth daily. 07/15/15  Yes Historical Provider, MD  HYDROcodone-acetaminophen (NORCO/VICODIN) 5-325 MG tablet TAKE 1/2 TO 1 TABLET BY MOUTH 2-3 TIMES DAILY AS NEEDED FOR SEVERE PAIN 02/04/15  Yes Historical Provider, MD  KLOR-CON 10 10 MEQ tablet Take 10 mEq by mouth daily. 07/15/15  Yes Historical Provider, MD  metFORMIN (GLUCOPHAGE) 500 MG tablet Take 500 mg by mouth 2 (two) times daily.  04/06/15  Yes Historical Provider, MD  metoprolol succinate (TOPROL-XL) 50 MG 24 hr tablet Take 25 mg by mouth 2 (two) times daily. Take with or immediately following a meal.   Yes Historical Provider, MD  nitroGLYCERIN (NITROSTAT)  0.4 MG SL tablet Place 0.4 mg under the tongue every 5 (five) minutes x 3 doses as needed for chest pain. Up to 3 doses, if pain continues, call 911. 06/05/11  Yes Thayer Headings, MD  PRADAXA 150 MG CAPS capsule TAKE ONE CAPSULE BY MOUTH TWICE DAILY 01/24/15  Yes Thayer Headings, MD    Current Facility-Administered Medications  Medication Dose Route Frequency Provider Last Rate Last Dose  . 0.9 %  sodium chloride infusion   Intravenous Continuous Samella Parr, NP      . acetaminophen (TYLENOL) tablet 650 mg  650 mg Oral Q6H PRN Samella Parr, NP       Or  . acetaminophen (TYLENOL) suppository 650 mg  650 mg Rectal Q6H PRN Samella Parr, NP      . amiodarone (PACERONE) tablet 200 mg  200 mg Oral Daily Samella Parr, NP      . clindamycin (CLEOCIN) IVPB 300 mg  300 mg Intravenous Q12H Samella Parr, NP      . insulin aspart (novoLOG) injection 0-9 Units  0-9 Units Subcutaneous 6 times per day Samella Parr, NP      . metoprolol (LOPRESSOR) injection 5 mg  5 mg Intravenous Q4H PRN Samella Parr, NP      . metoprolol succinate (TOPROL-XL) 24 hr tablet 25 mg  25 mg Oral BID Samella Parr, NP      . ondansetron [facility]) tablet 4 mg  4 mg Oral Q6H PRN Samella Parr, NP       Or  . ondansetron [facility]) injection 4 mg  4 mg Intravenous Q6H PRN Samella Parr, NP      . pantoprazole (PROTONIX) injection 40 mg  40 mg Intravenous Q12H Samella Parr, NP      . sodium chloride flush (NS) 0.9 % injection 3 mL  3 mL Intravenous Q12H Samella Parr, NP        Allergies as of 10/09/2015 - Review Complete 10/09/2015  Allergen Reaction Noted  . Demerol Other (See Comments) 07/28/2010  . Erythromycin Nausea And Vomiting 10/07/2012  . Penicillins Other (See Comments) 07/28/2010  . Percocet [oxycodone-acetaminophen] Swelling 10/07/2012    Family History  Problem Relation Age of Onset  . Heart attack Father   . Heart attack Mother   . Heart attack Brother   . Coronary artery disease  Sister     CABG  . Alzheimer's disease Sister     X3  . Stroke Mother   . Stroke Sister   . Heart attack Sister     Social History   Social History  . Marital Status: Married    Spouse Name: N/A  . Number of Children: N/A  . Years of Education: N/A   Occupational History  . Not on  file.   Social History Main Topics  . Smoking status: Former Smoker -- 1.00 packs/day    Types: Cigarettes    Quit date: 07/03/1963  . Smokeless tobacco: Never Used  . Alcohol Use: No  . Drug Use: No  . Sexual Activity: No   Other Topics Concern  . Not on file   Social History Narrative   Lives in [place] with husband.  Does not routinely exercise but is able to keep house and go food shopping.    Review of Systems: Pertinent positive and negative review of systems were noted in the above HPI section.  All other review of systems was otherwise negative.  Physical Exam: Vital signs in last 24 hours: Temp:  [97.6 F (36.4 C)] 97.6 F (36.4 C) (04/09 0349) Pulse Rate:  [81-95] 81 (04/09 0700) Resp:  [11-21] 11 (04/09 0700) BP: (98-105)/(72-74) 98/72 mmHg (04/09 0700) SpO2:  [97 %-100 %] 97 % (04/09 0700) Weight:  [149 lb (67.586 kg)] 149 lb (67.586 kg) (04/09 0349)   General:   Alert,  Well-developed, well-nourished, elderly WF,pleasant and cooperative in NAD Head:  Normocephalic and atraumatic. Eyes:  Sclera clear, no icterus.   Conjunctiva pink. Ears:  Normal auditory acuity. Nose:  No deformity, discharge,  or lesions. Mouth:  No deformity or lesions.   Neck:  Supple; no masses or thyromegaly. Lungs:  Clear throughout to auscultation.   No wheezes, crackles, or rhonchi. Heart:  Regular rate and rhythm; no murmurs, clicks, rubs,  or gallops. Abdomen:  Soft,nontender, BS active,nonpalp mass or hsm.   Rectal:  Deferred - nursing reports just had soft brown stool, then BRB dripping  Msk:  Symmetrical without gross deformities. . Pulses:  Normal pulses noted. Extremities:  Without  clubbing  LE bilaterally edematous and weeping , bandaged, as is left great toe. Neurologic:  Alert and  oriented x4;  grossly normal neurologically. Skin:. Psych:  Alert and cooperative. Normal mood and affect.  Intake/Output from previous day:   Intake/Output this shift:    Lab Results:  Recent Labs  10/09/15 0425 10/09/15 0843  WBC 8.0 8.8  HGB 10.0* 10.5*  HCT 31.0* 33.2*  PLT 232 230   BMET  Recent Labs  10/09/15 0425  NA 136  K 3.5  CL 101  CO2 24  GLUCOSE 165*  BUN 18  CREATININE 1.11*  CALCIUM 8.5*   LFT  Recent Labs  10/09/15 0425  PROT 5.6*  ALBUMIN 2.6*  AST 45*  ALT 33  ALKPHOS 76  BILITOT 0.9   PT/INR  Recent Labs  10/09/15 0425  LABPROT 29.3*  INR 2.83*  l  IMPRESSION:   #87 82 year old female with bright red blood per rectum, painless onset earlier today after a loose stool. This is in the setting of anticoagulation with Pradaxa, INR 2.83 on admission This appears to be lower GI bleeding, rule out internal hemorrhoidal, versus occult lesion #2 macrocytic anemia #3 coronary artery disease status post MI and drug-eluting stent 2013 #4 history of atrial fib flutter #5 adult-onset diabetes mellitus #6 obstructive sleep apnea #7 chronic venous disease bilateral lower extremities with recent onset of significant edema and weeping and possible myelitis of the left great toe-patient had been on a course of vancomycin outpatient     PLAN:  Full liquid diet Serial hemoglobins and transfuse for hemoglobin less than 8 given age and coronary artery disease Hold Pradaxa-patient did take a dose last night She will need colonoscopy this admission, but we'll need to  allow INR to normalize-expect  colonoscopy ?wednesday  Patient relates that she was supposed to have an MRI of her foot later this week-will ask hospitalist team to address the lateral lower extremity venous issues and question of osteomyelitis.     Amy Esterwood  10/09/2015,  9:51 AM   I have reviewed the entire case in detail with the above APP and discussed the plan in detail.  Therefore, I agree with the diagnoses recorded above. In addition,  I have personally interviewed and examined the patient and have personally reviewed any abdominal/pelvic CT scan images.  My additional thoughts are as follows:  Agree that there may be multifactorial anemia given macrocytosis.  LGIB is not brisk at this point, exacerbated by Pradaxa causing prolonged PT and PTT.  Colonoscopy , probably in 2-3 days    Nelida Meuse III Pager [phone]  Mon-Fri 8a-5p [phone] after 5p, weekends, holidays

## 2015-10-09 NOTE — Progress Notes (Signed)
Patient got up to [facility]. Voided with blood dripping from rectum. This is the second episode since patient arrived on unit.

## 2015-10-09 NOTE — Progress Notes (Signed)
Updated by RN that with passage of semi formed brown stool pt having trickle of BRBPR and SBP ~95 but o/w no sx's-will continue to monitor  Erin Hearing, ANP

## 2015-10-09 NOTE — ED Notes (Signed)
Patient arrives with complaint of rectal bleeding which she first noticed this AM when going to bed. Stated that the bleeding appeared as dark red and bright red blood. Quantified as "quite a bit". Additionally endorses pain with swallowing. Currently taking clindamycin for left big toe infection.

## 2015-10-09 NOTE — ED Provider Notes (Signed)
CSN: [id number]     Arrival date & time 10/09/15  0342 History   First MD Initiated Contact with Patient 10/09/15 0449     Chief Complaint  Patient presents with  . Rectal Bleeding     (Consider location/radiation/quality/duration/timing/severity/associated sxs/prior Treatment) HPI  Shannon [name] is a 82 y.o. female with past medical history of coronary artery disease, atrial fibrillation on Pradaxa, presenting today with rectal bleeding. Patient states this began early this morning around 3 AM when going to bed.  She denies ever having rectal bleeds in the past. She has some lightheadedness, denies shortness of breath. Patient did not take any new medications. She denies bleeding from elsewhere. There are no further complaints.  10 Systems reviewed and are negative for acute change except as noted in the HPI.     Past Medical History  Diagnosis Date  . CAD (coronary artery disease)     a. 05/2006 Cath/PCI: LAD 29m, D1 40 ost, LCX nl, OM1 95 (3.0x20 Taxus DES), RCA nl, EF 40% w/ lat AK;  04/2007 Ex MV: minimal lat ischemia in area of prior infarct->low risk-> med Rx.  [name] Hyperlipidemia   . Diabetes mellitus   . MVP (mitral valve prolapse)     a. 10/2003 Echo: nl LV fxn, mild MR/TR/AS  . Dysrhythmia     atrial fib& flutter  . Myocardial infarction ([place])   . Sleep apnea   . Transient ischemic attack   . Arthritis    Past Surgical History  Procedure Laterality Date  . Tonsillectomy    . Coronary angioplasty      Status post PTCA and stenting of the first obtuse marginal-05/13/2006. We placed a 3.0 x 20 mm Taxus stent. It was post dilated using a 3.25 mm noncompliant balloon up to 14 atmospheres  . Cataracts    . Atrial flutter ablation N/A 10/09/2012    Procedure: ATRIAL FLUTTER ABLATION;  Surgeon: Thompson Grayer, MD;  Location[facility] C[facility] LAB;  Service: Cardiovascular;  Laterality: N/A;   Family History  Problem Relation Age of Onset  . Heart attack Father   . Heart attack  Mother   . Heart attack Brother   . Coronary artery disease Sister     CABG  . Alzheimer's disease Sister     X3  . Stroke Mother   . Stroke Sister   . Heart attack Sister    Social History  Substance Use Topics  . Smoking status: Former Smoker -- 1.00 packs/day    Types: Cigarettes    Quit date: 07/03/1963  . Smokeless tobacco: Never Used  . Alcohol Use: No   OB History    No data available     Review of Systems    Allergies  Demerol; Erythromycin; Penicillins; and Percocet  Home Medications   Prior to Admission medications   Medication Sig Start Date End Date Taking? Authorizing Provider  ACCU-CHEK SMARTVIEW test strip TEST TWICE DAILY AS DIRECTED. 12/10/14   Historical Provider, MD  ACCU-CHEK SOFTCLIX LANCETS lancets See admin instructions. 03/02/15   Historical Provider, MD  amiodarone (PACERONE) 200 MG tablet Take 1 tablet (200 mg total) by mouth daily. 07/22/15   Thayer Headings, MD  atorvastatin (LIPITOR) 20 MG tablet TAKE ONE TABLET BY MOUTH ONE TIME DAILY 04/08/15   Thayer Headings, MD  Dexlansoprazole (DEXILANT PO) Take 1 tablet by mouth daily.    Historical Provider, MD  doxycycline (VIBRAMYCIN) 100 MG capsule Take 100 mg by mouth 2 (two) times daily.  Historical Provider, MD  furosemide (LASIX) 20 MG tablet Take 20 mg by mouth daily. 07/15/15   Historical Provider, MD  HYDROcodone-acetaminophen (NORCO/VICODIN) 5-325 MG tablet TAKE 1/2 TO 1 TABLET BY MOUTH 2-3 TIMES DAILY AS NEEDED FOR SEVERE PAIN 02/04/15   Historical Provider, MD  KLOR-CON 10 10 MEQ tablet Take 10 mEq by mouth daily. 07/15/15   Historical Provider, MD  metFORMIN (GLUCOPHAGE) 500 MG tablet Take 500 mg by mouth 2 (two) times daily.  04/06/15   Historical Provider, MD  metoprolol succinate (TOPROL-XL) 50 MG 24 hr tablet Take 25 mg by mouth 2 (two) times daily. Take with or immediately following a meal.    Historical Provider, MD  nitroGLYCERIN (NITROSTAT) 0.4 MG SL tablet Place 0.4 mg under the tongue  every 5 (five) minutes x 3 doses as needed for chest pain. Up to 3 doses, if pain continues, call 911. 06/05/11   Thayer Headings, MD  PRADAXA 150 MG CAPS capsule TAKE ONE CAPSULE BY MOUTH TWICE DAILY 01/24/15   Thayer Headings, MD  VITAMIN D, CHOLECALCIFEROL, PO Take 2,000 Units by mouth daily.     Historical Provider, MD   BP 101/74 mmHg  Pulse 95  Temp(Src) 97.6 F (36.4 C) (Oral)  Resp 20  Ht 5\' 2"  (1.575 m)  Wt 149 lb (67.586 kg)  BMI 27.25 kg/m2  SpO2 100% Physical Exam  Constitutional: She is oriented to person, place, and time. She appears well-developed and well-nourished. No distress.  HENT:  Head: Normocephalic and atraumatic.  Nose: Nose normal.  Mouth/Throat: Oropharynx is clear and moist. No oropharyngeal exudate.  Eyes: Conjunctivae and EOM are normal. Pupils are equal, round, and reactive to light. No scleral icterus.  Neck: Normal range of motion. Neck supple. No JVD present. No tracheal deviation present. No thyromegaly present.  Cardiovascular: Normal rate.  Exam reveals no gallop and no friction rub.   Murmur heard. Irregularly irregular rhythm.  Pulmonary/Chest: Effort normal and breath sounds normal. No respiratory distress. She has no wheezes. She exhibits no tenderness.  Abdominal: Soft. Bowel sounds are normal. She exhibits no distension and no mass. There is no tenderness. There is no rebound and no guarding.  Genitourinary: Guaiac positive stool.  Musculoskeletal: Normal range of motion. She exhibits edema. She exhibits no tenderness.  Lymphadenopathy:    She has no cervical adenopathy.  Neurological: She is alert and oriented to person, place, and time. No cranial nerve deficit. She exhibits normal muscle tone.  Skin: Skin is warm and dry. No rash noted. There is erythema. No pallor.  Lower extremities are wrapped in Ace wrap bilaterally. There is diffuse edema.  Nursing note and vitals reviewed.   ED Course  Procedures (including critical care  time) Labs Review Labs Reviewed  COMPREHENSIVE METABOLIC PANEL - Abnormal; Notable for the following:    Glucose, Bld 165 (*)    Creatinine, Ser 1.11 (*)    Calcium 8.5 (*)    Total Protein 5.6 (*)    Albumin 2.6 (*)    AST 45 (*)    GFR calc non Af Amer 45 (*)    GFR calc Af Amer 52 (*)    All other components within normal limits  CBC - Abnormal; Notable for the following:    RBC 3.06 (*)    Hemoglobin 10.0 (*)    HCT 31.0 (*)    MCV 101.3 (*)    RDW 16.3 (*)    All other components within normal limits  PROTIME-INR -  Abnormal; Notable for the following:    Prothrombin Time 29.3 (*)    INR 2.83 (*)    All other components within normal limits  APTT - Abnormal; Notable for the following:    aPTT 73 (*)    All other components within normal limits  POC OCCULT BLOOD, ED  TYPE AND SCREEN  ABO/RH    Imaging Review No results found. I have personally reviewed and evaluated these images and lab results as part of my medical decision-making.   EKG Interpretation None      MDM   Final diagnoses:  None   Patient presents to the emergency department for GI bleed. Hemoccult is positive. Patient a little hypotensive and was given IV fluids. Hemoglobin today is 10, her baseline is 12.8 INR is 2.8 likely from Pradaxa. Patient will require admission for treatment of GI bleed. I spoke with Dr. Fabio Neighbors who accepts the patient to tele for further care.  Everlene Balls, MD 10/09/15 [phone]

## 2015-10-09 NOTE — Consult Note (Signed)
WOC wound consult note Reason for Consult:Chronic non-healing wound to left great toe; seen by [facility] MD in the community.  Daily wound care is not an option for the patient as she has an [facility] who must find someone in the home to teach if daily wound care orders are provided. 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 Wound type: arterial insufficiency in the presence of venous disease (mixed etiology) Pressure Ulcer POA: No Measurement:0.5cm x 1.8cm x 0.2cm Wound bed: dry with light yellow crust (dried serum) Drainage (amount, consistency, odor) none Periwound:intact Dressing procedure/placement/frequency: I have provided orders via Nursing for topical care of the wound with hydrogel.  After initiation today, we will perform three times weekly, which should be sufficient to qualify her as requiring intermittent care.  The bilateral Unna's Boots will be replaced today by ortho tech and can remain as once weekly.  LE elevation will continue while in house. Patient should return to the care of [facility] upon discharge unless she and her husband are together interested in moving to a high level of care, ALF or SNF. [facility] nursing team will not follow, but will remain available to this patient, the nursing and medical teams.  Please re-consult if needed. Thanks, Maudie Flakes, MSN, RN, [place], Arther Abbott  Pager# [phone]

## 2015-10-09 NOTE — H&P (Signed)
[facility] History and Physical                                                                                    Shannon [name], is a 82 y.o. female  MRN: [id number]   DOB - 1931/11/28  Admit Date - 10/09/2015  Outpatient Primary MD for the patient is Haywood Pao, MD  Referring MD: Creig Hines  Consulting M.D: [facility]  PMH: Past Medical History  Diagnosis Date  . CAD (coronary artery disease)     a. 05/2006 Cath/PCI: LAD 72m, D1 40 ost, LCX nl, OM1 95 (3.0x20 Taxus DES), RCA nl, EF 40% w/ lat AK;  04/2007 Ex MV: minimal lat ischemia in area of prior infarct->low risk-> med Rx.  [name] Hyperlipidemia   . Diabetes mellitus   . MVP (mitral valve prolapse)     a. 10/2003 Echo: nl LV fxn, mild MR/TR/AS  . Dysrhythmia     atrial fib& flutter  . Myocardial infarction ([place])   . Sleep apnea   . Transient ischemic attack   . Arthritis       PSH: Past Surgical History  Procedure Laterality Date  . Tonsillectomy    . Coronary angioplasty      Status post PTCA and stenting of the first obtuse marginal-05/13/2006. We placed a 3.0 x 20 mm Taxus stent. It was post dilated using a 3.25 mm noncompliant balloon up to 14 atmospheres  . Cataracts    . Atrial flutter ablation N/A 10/09/2012    Procedure: ATRIAL FLUTTER ABLATION;  Surgeon: Thompson Grayer, MD;  Location: [facility] CATH LAB;  Service: Cardiovascular;  Laterality: N/A;     CC:  Chief Complaint  Patient presents with  . 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 Patient presented to the ER after awakening around 3 AM with multiple episodes of rectal bleeding. She describes the stool as being dark with multiple large thick clots. She's had recent  issues with nausea ongoing for about 2 months that she treated to antibiotic usage. She reports increasing sharp knifelike/stabbing epigastric pains after taking clindamycin. She reports a history of remote ulcer disease although states she has never undergone upper or lower endoscopy. Because of these symptoms her primary care provider started her on Dexilant but this did not improve her symptoms. The patient also told my attending physician that several months ago she did either vomit up or cough up some blood-tinged fluid. She has not noticed whether food improves or exacerbates the sharp chest discomfort that she has felt in the past. Of note because of her paroxysmal atrial fibrillation patient takes chronic Pradaxa. She's not had any chest pain or shortness of breath. 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 She has reported over several months increasing weakness and exercise intolerance. She has had 6 falls in the past 2 weeks with one of those occurring while she fell asleep  in a chair and fell forward. Since arriving to the ER patient has not had any further melena.  ER Evaluation and treatment: T 97.6-BP 101/74-HR 95 and irregular- RR 20- room air pulse oximetry 100% Lab data: Na 136, K 3.5, BUN 18, Cr 1.11, glucose 165, AST 45, WBCs 8000, hemoglobin 10, MCV 101.3, platelets 232,000, PTT 29.3 and INR 2.83 with PTT 73 1 L normal saline bolus  Review of Systems   In addition to the HPI above,  No Fever-chills, myalgias or other constitutional symptoms No Headache, changes with Vision or hearing, new weakness, tingling, numbness in any extremity, No problems swallowing food or Liquids, indigestion/reflux No Chest pain, Cough or Shortness of Breath, palpitations, orthopnea or DOE No dysuria, hematuria or flank pain No new skin rashes, lesions, masses or bruises, No new joints pains-aches No recent weight gain or loss No polyuria, polydypsia or polyphagia,  *A  full 10 point Review of Systems was done, except as stated above, all other Review of Systems were negative.  Social History Social History  Substance Use Topics  . Smoking status: Former Smoker -- 1.00 packs/day    Types: Cigarettes    Quit date: 07/03/1963  . Smokeless tobacco: Never Used  . Alcohol Use: No    Resides at: Private residence  Lives with: Spouse who is bedridden and she is primary caretaker  Ambulatory status: Without assistive devices   Family History Family History  Problem Relation Age of Onset  . Heart attack Father   . Heart attack Mother   . Heart attack Brother   . Coronary artery disease Sister     CABG  . Alzheimer's disease Sister     X3  . Stroke Mother   . Stroke Sister   . Heart attack Sister      Prior to Admission medications   Medication Sig Start Date End Date Taking? Authorizing Provider  amiodarone (PACERONE) 200 MG tablet Take 1 tablet (200 mg total) by mouth daily. 07/22/15  Yes Thayer Headings, MD  atorvastatin (LIPITOR) 20 MG tablet TAKE ONE TABLET BY MOUTH ONE TIME DAILY 04/08/15  Yes Thayer Headings, MD  clindamycin (CLEOCIN) 300 MG capsule Take 300 mg by mouth 2 (two) times daily. 10/05/15  Yes Historical Provider, MD  furosemide (LASIX) 20 MG tablet Take 20 mg by mouth daily. 07/15/15  Yes Historical Provider, MD  HYDROcodone-acetaminophen (NORCO/VICODIN) 5-325 MG tablet TAKE 1/2 TO 1 TABLET BY MOUTH 2-3 TIMES DAILY AS NEEDED FOR SEVERE PAIN 02/04/15  Yes Historical Provider, MD  KLOR-CON 10 10 MEQ tablet Take 10 mEq by mouth daily. 07/15/15  Yes Historical Provider, MD  metFORMIN (GLUCOPHAGE) 500 MG tablet Take 500 mg by mouth 2 (two) times daily.  04/06/15  Yes Historical Provider, MD  metoprolol succinate (TOPROL-XL) 50 MG 24 hr tablet Take 25 mg by mouth 2 (two) times daily. Take with or immediately following a meal.   Yes Historical Provider, MD  nitroGLYCERIN (NITROSTAT) 0.4 MG SL tablet Place 0.4 mg under the tongue every 5 (five)  minutes x 3 doses as needed for chest pain. Up to 3 doses, if pain continues, call 911. 06/05/11  Yes Thayer Headings, MD  PRADAXA 150 MG CAPS capsule TAKE ONE CAPSULE BY MOUTH TWICE DAILY 01/24/15  Yes Thayer Headings, MD  ACCU-CHEK SMARTVIEW test strip TEST TWICE DAILY AS DIRECTED. 12/10/14   Historical Provider, MD  ACCU-CHEK SOFTCLIX LANCETS lancets See admin instructions. 03/02/15   Historical Provider, MD  Allergies  Allergen Reactions  . Demerol Other (See Comments)    Burning sensation all over body  . Erythromycin Nausea And Vomiting  . Penicillins Other (See Comments)    unknown  . Percocet [Oxycodone-Acetaminophen] Swelling    Physical Exam  Vitals  Blood pressure 98/72, pulse 81, temperature 97.6 F (36.4 C), temperature source Oral, resp. rate 11, height 5\' 2"  (1.575 m), weight 149 lb (67.586 kg), SpO2 97 %.   General:  In no acute distress, appears healthy and well nourishedAnd younger than stated age although does appear pale  Psych:  Normal affect, Denies Suicidal or Homicidal ideations, Awake Alert, Oriented X 3. Speech and thought patterns are clear and appropriate, no apparent short term memory deficits  Neuro:   No focal neurological deficits, CN II through XII intact, Strength 5/5 all 4 extremities, Sensation intact all 4 extremities.  ENT:  Ears and Eyes appear Normal, Conjunctivae clear and pale, PER. Moist oral mucosa without erythema or exudates.  Neck:  Supple, No lymphadenopathy appreciated  Respiratory:  Symmetrical chest wall movement, Good air movement bilaterally, CTAB. Room Air  Cardiac:  RRR, No Murmurs, has puffy bilateral LE edema noted, no JVD, No carotid bruits, peripheral pulses palpable at 2+  Abdomen:  Positive hyperactive bowel sounds, Soft, mildly tender epigastrium and right upper quadrant, Non distended,  No masses appreciated, no obvious hepatosplenomegaly  Skin:  No Cyanosis, Normal Skin Turgor, No Skin Rash or  Bruise.  Extremities: Symmetrical without obvious trauma or injury,  no effusions. Bilateral Unna boots in place as well as dressing over left great toe  Data Review  CBC  Recent Labs Lab 10/09/15 0425  WBC 8.0  HGB 10.0*  HCT 31.0*  PLT 232  MCV 101.3*  MCH 32.7  MCHC 32.3  RDW 16.3*    Chemistries   Recent Labs Lab 10/09/15 0425  NA 136  K 3.5  CL 101  CO2 24  GLUCOSE 165*  BUN 18  CREATININE 1.11*  CALCIUM 8.5*  AST 45*  ALT 33  ALKPHOS 76  BILITOT 0.9    estimated creatinine clearance is 35.2 mL/min (by C-G formula based on Cr of 1.11).  No results for input(s): TSH, T4TOTAL, T3FREE, THYROIDAB in the last 72 hours.  Invalid input(s): FREET3  Coagulation profile  Recent Labs Lab 10/09/15 0425  INR 2.83*    No results for input(s): DDIMER in the last 72 hours.  Cardiac Enzymes No results for input(s): CKMB, TROPONINI, MYOGLOBIN in the last 168 hours.  Invalid input(s): CK  Invalid input(s): POCBNP  Urinalysis    Component Value Date/Time   COLORURINE YELLOW 10/09/2012 1551   APPEARANCEUR HAZY* 10/09/2012 1551   LABSPEC 1.015 10/09/2012 1551   PHURINE 7.0 10/09/2012 1551   GLUCOSEU NEGATIVE 10/09/2012 1551   HGBUR NEGATIVE 10/09/2012 1551   BILIRUBINUR NEGATIVE 10/09/2012 1551   KETONESUR NEGATIVE 10/09/2012 1551   PROTEINUR NEGATIVE 10/09/2012 1551   UROBILINOGEN 0.2 10/09/2012 1551   NITRITE POSITIVE* 10/09/2012 1551   LEUKOCYTESUR LARGE* 10/09/2012 1551    Imaging results:   Dg Toe Great Left  10/05/2015  CLINICAL DATA:  Diabetic ulcer on bottom of left great toe. Pt just came from wound center and bandage just placed so unable to remove. Osteomyelitis. EXAM: LEFT GREAT TOE COMPARISON:  None. FINDINGS: There is a subtle erosion of the white cortical line along the medial tip of the distal tuft of the great toe underlying the soft tissue ulcer. This is suggestive of osteomyelitis. There  is no fracture.  There is no other evidence of  osteomyelitis. IMPRESSION: Probable subtle area of osteomyelitis of the distal tuft of the left great toe underlying the great toe soft tissue ulcer. Electronically Signed   By: Lajean Manes M.D.   On: 10/05/2015 16:36      Assessment & Plan  Principal Problem:   Acute GI bleeding -Presents with acute episode of melena with clots with associated greater than 2 months of nausea and epigastric pain concerning for upper GI bleeding source -Admit to telemetry/Inpatient -[place] [facility] has been consulted -NPO with IV fluids -Protonix 40 mg IV every 12 hours -Patient has never undergone endoscopy and previously was diagnosed with ulcers based on history and symptoms -Hold Pradaxa; pending GI evaluation (and endoscopic results) this medication may need to be held briefly or permanently -H. pylori serologies  Active Problems:   Atrial fibrillation  -Currently rate controlled -On amiodarone prior to admission but will hold dose until patient evaluated by GI-check TSH -CHADVASc = 8 -Pradaxa on hold as above -prn IV Lopressor for tachycardia persisting greater than 120 bpm    History of recent fall -Potentially related to symptomatic anemia -PT evaluation in a.m. -Patient with diabetes and potentially could have peripheral neuropathy which may or may not be exacerbated by the potential B12 deficiency    Acute blood loss anemia -Based on laboratory data dating back to July 2016 patient hemoglobin between 11.8 and 12.8 -Patient reports that since late last year her hemoglobin has been averaging around 10 -Current hemoglobin 10 -Repeat CBC at 10 AM and likely will need to cycle every 12 hours -History of CAD so would transfuse for hemoglobin less than or equal to 8 -Has a macrocytic component to her anemia which is likely chronic therefore check anemia panel    CAD  -Currently asymptomatic without reports of ischemic sounding chest discomfort (patient reported sharp epigastric  and right upper quadrant pain typically after taking antibiotics) -Beta blocker temporarily on hold as is statin    Chronic venous stasis dermatitis/Cellulitis of toe, left great -Convert preadmission Cleocin to IV while NPO -Wound care RN to assist with management of left great toe dressings and Unna boots -Patient reports outpatient treating physician concerned over osteomyelitis and left great toe and plans outpatient MRI this week -Lasix for chronic recurrent lower extremity edema on hold    Hyperlipidemia -Preadmission statin on hold until GI evaluation complete    Diabetes mellitus  -Preadmission metformin on hold -Follow CBGs and provide SSI -Check hemoglobin A1c    DVT Prophylaxis: SCDs  Family Communication:   Son at bedside  Code Status:  Full code  Condition:  Stable  Discharge disposition: Is medically stable and GI bleeding symptoms resolve anticipate discharge to previous home environment pending PT evaluation-may need home health assistance and referral to home health social work and for other services given patient is caring for bedridden husband alone at home-son did mention there are 3 other siblings that are not working and it was suggested to the patient that she ask them to specifically begin helping her with the care of her husband  Time spent in minutes : 60      ELLIS,ALLISON L. ANP on 10/09/2015 at 8:13 AM  You may contact me by going to [email or web address] - password [email or web address]  I am available from 7a-7p but please confirm I am on the schedule by going to Amion as above.   After 7p please contact night coverage person covering me after  hours  [facility]

## 2015-10-10 ENCOUNTER — Observation Stay (HOSPITAL_COMMUNITY): Payer: Commercial Managed Care - HMO

## 2015-10-10 DIAGNOSIS — I480 Paroxysmal atrial fibrillation: Secondary | ICD-10-CM | POA: Diagnosis not present

## 2015-10-10 DIAGNOSIS — K922 Gastrointestinal hemorrhage, unspecified: Secondary | ICD-10-CM | POA: Diagnosis not present

## 2015-10-10 DIAGNOSIS — D62 Acute posthemorrhagic anemia: Secondary | ICD-10-CM | POA: Diagnosis not present

## 2015-10-10 DIAGNOSIS — I8312 Varicose veins of left lower extremity with inflammation: Secondary | ICD-10-CM | POA: Diagnosis not present

## 2015-10-10 DIAGNOSIS — D123 Benign neoplasm of transverse colon: Secondary | ICD-10-CM | POA: Diagnosis not present

## 2015-10-10 DIAGNOSIS — D122 Benign neoplasm of ascending colon: Secondary | ICD-10-CM | POA: Diagnosis not present

## 2015-10-10 DIAGNOSIS — E11621 Type 2 diabetes mellitus with foot ulcer: Secondary | ICD-10-CM | POA: Diagnosis not present

## 2015-10-10 LAB — COMPREHENSIVE METABOLIC PANEL
ALBUMIN: 2.3 g/dL — AB (ref 3.5–5.0)
ALT: 40 U/L (ref 14–54)
ANION GAP: 9 (ref 5–15)
AST: 56 U/L — AB (ref 15–41)
Alkaline Phosphatase: 65 U/L (ref 38–126)
BILIRUBIN TOTAL: 0.6 mg/dL (ref 0.3–1.2)
BUN: 13 mg/dL (ref 6–20)
CHLORIDE: 105 mmol/L (ref 101–111)
CO2: 25 mmol/L (ref 22–32)
Calcium: 8.1 mg/dL — ABNORMAL LOW (ref 8.9–10.3)
Creatinine, Ser: 0.83 mg/dL (ref 0.44–1.00)
GFR calc Af Amer: >60 mL/min (ref >60)
GFR calc non Af Amer: >60 mL/min (ref >60)
GLUCOSE: 127 mg/dL — AB (ref 65–99)
POTASSIUM: 4.1 mmol/L (ref 3.5–5.1)
SODIUM: 139 mmol/L (ref 135–145)
TOTAL PROTEIN: 5 g/dL — AB (ref 6.5–8.1)

## 2015-10-10 LAB — GLUCOSE, CAPILLARY
GLUCOSE-CAPILLARY: 130 mg/dL — AB (ref 65–99)
GLUCOSE-CAPILLARY: 113 mg/dL — AB (ref 65–99)
GLUCOSE-CAPILLARY: 136 mg/dL — AB (ref 65–99)
GLUCOSE-CAPILLARY: 132 mg/dL — AB (ref 65–99)
Glucose-Capillary: 111 mg/dL — ABNORMAL HIGH (ref 65–99)
Glucose-Capillary: 137 mg/dL — ABNORMAL HIGH (ref 65–99)
Glucose-Capillary: 67 mg/dL (ref 65–99)

## 2015-10-10 LAB — APTT: APTT: 50 s — AB (ref 24–37)

## 2015-10-10 LAB — CBC
HEMATOCRIT: 32 % — AB (ref 36.0–46.0)
Hemoglobin: 10.3 g/dL — ABNORMAL LOW (ref 12.0–15.0)
MCH: 32.9 pg (ref 26.0–34.0)
MCHC: 32.2 g/dL (ref 30.0–36.0)
MCV: 102.2 fL — AB (ref 78.0–100.0)
PLATELETS: 231 10*3/uL (ref 150–400)
RBC: 3.13 MIL/uL — AB (ref 3.87–5.11)
RDW: 16.6 % — AB (ref 11.5–15.5)
WBC: 7.7 10*3/uL (ref 4.0–10.5)

## 2015-10-10 LAB — H PYLORI, IGM, IGG, IGA AB
H Pylori IgG: <0.9 U/mL (ref 0.0–0.8)
H. Pylogi, Iga Abs: <9 units (ref 0.0–8.9)

## 2015-10-10 LAB — HEMOGLOBIN A1C
HEMOGLOBIN A1C: 6.8 % — AB (ref 4.8–5.6)
Mean Plasma Glucose: 148 mg/dL

## 2015-10-10 LAB — PROTIME-INR
INR: 1.96 — ABNORMAL HIGH (ref 0.00–1.49)
Prothrombin Time: 22.2 seconds — ABNORMAL HIGH (ref 11.6–15.2)

## 2015-10-10 NOTE — Evaluation (Signed)
Physical Therapy Evaluation Patient Details Name: Shannon [name] MRN: [id number] DOB: [birth year] Today's Date: 10/10/2015   History of Present Illness  Shannon [name] is a 82 y.o. female with a past medical history significant for CAD, atrial fibrillation on anticoagulation of Pradaxa, MVP, OSA, DM type 2, possible PUD, and HLD; who presented with complaints of rectal bleeding since this morning.   Clinical Impression  Pt admitted with above diagnosis. Pt currently with functional limitations due to the deficits listed below (see PT Problem List). Pt fatigues very quickly with mobility and admits to 2 recent falls at home as well as inability to ambulate through home without resting. She was also having difficulty with bathing and dressing at home. Ambulated 200' with RW and min-guard A, requiring 2 standing rest breaks.  Pt will benefit from skilled PT to increase their independence and safety with mobility to allow discharge to the venue listed below.       Follow Up Recommendations Home health PT;Supervision - Intermittent    Equipment Recommendations  Other (comment) (rollator with 4 wheels and seat)    Recommendations for Other Services OT consult     Precautions / Restrictions Precautions Precautions: Fall Precaution Comments: pt reports 2 falls recently where legs gave way, first time she was unable to get up and RN from [facility] helped her, second time she was able to get herself up Restrictions Weight Bearing Restrictions: No      Mobility  Bed Mobility Overal bed mobility: Modified Independent             General bed mobility comments: pt able to get to EOB with increased time and effort  Transfers Overall transfer level: Needs assistance Equipment used: None Transfers: Sit to/from [facility] Sit to Stand: Min guard Stand pivot transfers: Min guard       General transfer comment: min-guard for safety with pivot to BSC, pt unsteady  and mildly dizzy with initial standing. Pt reports she is often incontinent of bladder and bowel and was having trouble putting on Depends at home  Ambulation/Gait Ambulation/Gait assistance: Min assist Ambulation Distance (Feet): 200 Feet Assistive device: Rolling walker (2 wheeled) Gait Pattern/deviations: Step-through pattern;Trunk flexed Gait velocity: decreased Gait velocity interpretation: Below normal speed for age/gender General Gait Details: pt required 3 standing rest beraks during 200' of ambulation. O2 sats 96%, HR 95 bpm. Pt with increasing fatigue with increasing distance.   Stairs            Wheelchair Mobility    Modified Rankin (Stroke Patients Only)       Balance Overall balance assessment: Needs assistance;History of Falls Sitting-balance support: No upper extremity supported Sitting balance-Leahy Scale: Good     Standing balance support: No upper extremity supported Standing balance-Leahy Scale: Fair Standing balance comment: pt able to stand and perform perineal care without UE support but reaches for RW as soon as she is finishedf                             Pertinent Vitals/Pain Pain Assessment: No/denies pain  VSS but DOE 2/4 with ambulation    Home Living Family/patient expects to be discharged to:: Private residence Living Arrangements: Spouse/significant other Available Help at Discharge: Family;Available PRN/intermittently Type of Home: House Home Access: Stairs to enter Entrance Stairs-Rails: None Entrance Stairs-Number of Steps: 1 Home Layout: One level Home Equipment: None Additional Comments: 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 It is getting harder for her to care for him and she mentions going to live with her son and husband going into facility.     Prior Function Level of Independence: Independent         Comments: pt was driving but lately family has been driving her. She cooks, cleans,  etc but had gotten to the point recently where she couldn't walk room to room in her home without sitting to rest and could not get up the one step into her home     Hand Dominance        Extremity/Trunk Assessment   Upper Extremity Assessment: Generalized weakness           Lower Extremity Assessment: Generalized weakness      Cervical / Trunk Assessment: Kyphotic  Communication   Communication: No difficulties  Cognition Arousal/Alertness: Awake/alert Behavior During Therapy: WFL for tasks assessed/performed Overall Cognitive Status: Within Functional Limits for tasks assessed                      General Comments General comments (skin integrity, edema, etc.): pt repeated stories that she relayed at the beginning of the session again at the end    Exercises        Assessment/Plan    PT Assessment Patient needs continued PT services  PT Diagnosis Difficulty walking;Generalized weakness   PT Problem List Decreased strength;Decreased activity tolerance;Decreased balance;Decreased mobility;Decreased knowledge of use of DME;Decreased knowledge of precautions  PT Treatment Interventions DME instruction;Gait training;Stair training;Functional mobility training;Therapeutic activities;Therapeutic exercise;Balance training;Patient/family education   PT Goals (Current goals can be found in the Care Plan section) Acute Rehab PT Goals Patient Stated Goal: have more energy PT Goal Formulation: With patient Time For Goal Achievement: 10/24/15 Potential to Achieve Goals: Good    Frequency Min 3X/week   Barriers to discharge Decreased caregiver support pt was caregiver in her household    Co-evaluation               End of Session Equipment Utilized During Treatment: Gait belt Activity Tolerance: Patient tolerated treatment well Patient left: in chair;with call bell/phone within reach Nurse Communication: Mobility status    Functional Assessment Tool  Used: clinical judgement Functional Limitation: Mobility: Walking and moving around Mobility: Walking and Moving Around Current Status [id number]): At least 1 percent but less than 20 percent impaired, limited or restricted Mobility: Walking and Moving Around Goal Status [phone]): 0 percent impaired, limited or restricted    Time: [id number] PT Time Calculation (min) (ACUTE ONLY): 25 min   Charges:   PT Evaluation $PT Eval Moderate Complexity: 1 Procedure PT Treatments $Gait Training: 8-22 mins   PT G Codes:   PT G-Codes **NOT FOR INPATIENT CLASS** Functional Assessment Tool Used: clinical judgement Functional Limitation: Mobility: Walking and moving around Mobility: Walking and Moving Around Current Status [id number]): At least 1 percent but less than 20 percent impaired, limited or restricted Mobility: Walking and Moving Around Goal Status [phone]): 0 percent impaired, limited or restricted   Leighton Roach, [place], [place] 10/10/2015, 1:16 PM

## 2015-10-10 NOTE — Progress Notes (Signed)
Dressing put on the left great toe per order.

## 2015-10-10 NOTE — Progress Notes (Signed)
Orthopedic Tech Progress Note Patient Details:  Shannon [name] September 18, 1931 [id number]  Ortho Devices Type of Ortho Device: Louretta Parma boot Ortho Device/Splint Location: bilateral Ortho Device/Splint Interventions: Application   [name] 10/10/2015, 9:19 AM

## 2015-10-10 NOTE — Progress Notes (Signed)
Unna boots bilateral lower extremities done by ortho tech.

## 2015-10-10 NOTE — Progress Notes (Signed)
Hypoglycemic Event  CBG: Results for Shannon [name], Shannon [name] (MRN [id number]) as of 10/10/2015 00:07  Ref. Range 10/09/2015 23:51  Glucose-Capillary Latest Ref Range: 65-99 mg/dL 67    Treatment: 15 GM carbohydrate snack  Symptoms: None  Follow-up CBG: Time: CBG Result: Results for Shannon [name], Shannon [name] (MRN [id number]) as of 10/10/2015 01:29  Ref. Range 10/10/2015 01:26  Glucose-Capillary Latest Ref Range: 65-99 mg/dL 111 (H)    Possible Reasons for Event: Inadequate meal intake  Comments/MD notified:    Viviano Simas

## 2015-10-10 NOTE — Progress Notes (Signed)
Daily Rounding Note  10/10/2015, 9:19 AM  LOS: 1 day   SUBJECTIVE:       Last bleeding was smaller volume, yesterday afternoon.  No abdominal pain, no nausea  OBJECTIVE:         Vital signs in last 24 hours:    Temp:  [97.6 F (36.4 C)-98.1 F (36.7 C)] 98 F (36.7 C) (04/10 0817) Pulse Rate:  [86-98] 98 (04/10 0817) Resp:  [16-20] 20 (04/10 0817) BP: (90-91)/(56-61) 90/56 mmHg (04/10 0817) SpO2:  [100 %] 100 % (04/10 0817) Weight:  [78.6 kg (173 lb 4.5 oz)] 78.6 kg (173 lb 4.5 oz) (04/09 1924) Last BM Date: 10/09/15 Filed Weights   10/09/15 0349 10/09/15 1924  Weight: 67.586 kg (149 lb) 78.6 kg (173 lb 4.5 oz)   General: pleasant, pale,    Heart: Regular rhythm.  Rate in low 100s Chest: no cough or dyspnea Abdomen: soft, NT, ND.  Active BS  Extremities: +  Bil edema, ace wrap to bothe lower legs. Neuro/Psych:  Pleasant, alert, cooperative.    Intake/Output from previous day: 04/09 0701 - 04/10 0700 In: 2060 [P.O.:960; I.V.:950; IV Piggyback:150] Out: 1475 [Urine:1475]  Intake/Output this shift:    Lab Results:  Recent Labs  10/09/15 0843 10/09/15 1745 10/10/15 0607  WBC 8.8 6.0 7.7  HGB 10.5* 9.7* 10.3*  HCT 33.2* 30.5* 32.0*  PLT 230 198 231   BMET  Recent Labs  10/09/15 0425 10/10/15 0607  NA 136 139  K 3.5 4.1  CL 101 105  CO2 24 25  GLUCOSE 165* 127*  BUN 18 13  CREATININE 1.11* 0.83  CALCIUM 8.5* 8.1*   LFT  Recent Labs  10/09/15 0425 10/10/15 0607  PROT 5.6* 5.0*  ALBUMIN 2.6* 2.3*  AST 45* 56*  ALT 33 40  ALKPHOS 76 65  BILITOT 0.9 0.6   PT/INR  Recent Labs  10/09/15 0425 10/10/15 0607  LABPROT 29.3* 22.2*  INR 2.83* 1.96*   Hepatitis Panel No results for input(s): HEPBSAG, HCVAB, HEPAIGM, HEPBIGM in the last 72 hours.  Studies/Results: No results found.   Scheduled Meds: . amiodarone  200 mg Oral Daily  . clindamycin (CLEOCIN) IV  300 mg Intravenous  Q12H  . insulin aspart  0-9 Units Subcutaneous 6 times per day  . metoprolol succinate  25 mg Oral BID  . pantoprazole (PROTONIX) IV  40 mg Intravenous Q12H  . sodium chloride flush  3 mL Intravenous Q12H   Continuous Infusions: . sodium chloride 10 mL/hr at 10/10/15 0711   PRN Meds:.acetaminophen **OR** acetaminophen, metoprolol, ondansetron **OR** ondansetron (ZOFRAN) IV   ASSESMENT:   *  Bleeding PR.  No previous colonoscopy.   *  Macrocytic anemia.  Iron studies with low iron but normal Ferritin, TIBC.  Folate, B12 normal.   Hgb improved.  No transfusions to date.   *   Peripheral vascular disease. ? Osteomyelitis of toe?  [facility] Rx'd 10/05/15..   *  S/p 2013 DES for CAD.  Hx A fib/flutter.  On Pradaxa.  Last dose was 4/8     *  DM type 2.  Glucophage at home.   *  OSA, EF 50 to 55%.     PLAN   *  Colonoscopy slot for 4/12 at noon.  Start clears 4/9.    *  Stopped IV Protonix, this is lower GIB.   No UGI sxs, no PPI at home.   *  Coags, CBC  in AM.      Shannon [name]  10/10/2015, 9:19 AM Pager: [phone]

## 2015-10-10 NOTE — Progress Notes (Addendum)
[facility] PROGRESS NOTE    Progress Note   Shannon [name] [id number] DOB: June 01, 1932 DOA: 10/09/2015 PCP: Haywood Pao, MD   Brief Narrative:   Shannon [name] is an 82 y.o. female CAD chronic atrial fibrillation on pradaxa the comes in for lower GI bleed.  Assessment/Plan:   Acute GI bleeding/Acute blood loss anemia: PT/INR does not have any correlation with Pradaxa, it has a half life of about 12-20 hrs. CBC q 12hrs. hbg is stable. Based on laboratory data dating back to July 2016 patient hemoglobin between 11.8 and 12.8.  Chronic a fib: Hold anticoagulation. Rate controlled. Continue beta blocker and amiodarone.  History of recent fall: No clear etiology, PT OT consult.  CAD (coronary artery disease) Asymptomatic.  Diabetes mellitus ([place]) Hold metformin, continue sliding scale insulin, continue full liquid diet. A1c is pending.  Chronic venous stasis/cellulitis of left great toe: Check MRI, no purulent drainage, it looks more like an arterial induce ulcer. He does chronic venous stasis changes.    DVT Prophylaxis SCD  Family Communication: none Disposition Plan: Home 3 days Code Status:     Code Status Orders        Start     Ordered   10/09/15 0759  Full code   Continuous     10/09/15 0800    Code Status History    Date Active Date Inactive Code Status Order ID Comments User Context   10/09/2012  3:19 PM 10/09/2012 11:55 PM Full Code [id number]  Thompson Grayer, MD Inpatient        IV Access:    Peripheral IV   Procedures and diagnostic studies:   No results found.   Medical Consultants:    None.  Anti-Infectives:   Anti-infectives    Start     Dose/Rate Route Frequency Ordered Stop   10/09/15 1000  clindamycin (CLEOCIN) IVPB 300 mg     300 mg 100 mL/hr over 30 Minutes Intravenous Every 12 hours 10/09/15 [id number]        Subjective:    Shannon [name] asymptomatic no complaints.  Objective:    Filed  Vitals:   10/09/15 1710 10/09/15 1924 10/09/15 2012 10/10/15 0412  BP: 90/61  91/58 91/59  Pulse: 86  90 88  Temp: 97.6 F (36.4 C)  98 F (36.7 C) 98.1 F (36.7 C)  TempSrc: Oral     Resp: 16  18 20   Height:      Weight:  78.6 kg (173 lb 4.5 oz)    SpO2: 100%  100% 100%    Intake/Output Summary (Last 24 hours) at 10/10/15 0659 Last data filed at 10/10/15 0600  Gross per 24 hour  Intake   2060 ml  Output   1475 ml  Net    585 ml   Filed Weights   10/09/15 0349 10/09/15 1924  Weight: 67.586 kg (149 lb) 78.6 kg (173 lb 4.5 oz)    Exam: Gen:  NAD Cardiovascular:  RRR, No M/R/G Chest and lungs:   CTAB Abdomen:  Abdomen soft, NT/ND, + BS Extremities: Left foot great toe pressure.   Data Reviewed:    Labs: Basic Metabolic Panel:  Recent Labs Lab 10/09/15 0425 10/10/15 0607  NA 136 139  K 3.5 4.1  CL 101 105  CO2 24 25  GLUCOSE 165* 127*  BUN 18 13  CREATININE 1.11* 0.83  CALCIUM 8.5* 8.1*   GFR Estimated Creatinine Clearance: 50.7 mL/min (by C-G formula based on Cr of 0.83). Liver  Function Tests:  Recent Labs Lab 10/09/15 0425 10/10/15 0607  AST 45* 56*  ALT 33 40  ALKPHOS 76 65  BILITOT 0.9 0.6  PROT 5.6* 5.0*  ALBUMIN 2.6* 2.3*   No results for input(s): LIPASE, AMYLASE in the last 168 hours. No results for input(s): AMMONIA in the last 168 hours. Coagulation profile  Recent Labs Lab 10/09/15 0425  INR 2.83*    CBC:  Recent Labs Lab 10/09/15 0425 10/09/15 0843 10/09/15 1745 10/10/15 0607  WBC 8.0 8.8 6.0 7.7  NEUTROABS  --  7.0  --   --   HGB 10.0* 10.5* 9.7* 10.3*  HCT 31.0* 33.2* 30.5* 32.0*  MCV 101.3* 101.5* 102.0* 102.2*  PLT 232 230 198 231   Cardiac Enzymes: No results for input(s): CKTOTAL, CKMB, CKMBINDEX, TROPONINI in the last 168 hours. BNP (last 3 results) No results for input(s): PROBNP in the last 8760 hours. CBG:  Recent Labs Lab 10/09/15 1713 10/09/15 2009 10/09/15 2351 10/10/15 0126 10/10/15 0409    GLUCAP 109* 173* 67 111* 130*   D-Dimer: No results for input(s): DDIMER in the last 72 hours. Hgb A1c: No results for input(s): HGBA1C in the last 72 hours. Lipid Profile: No results for input(s): CHOL, HDL, LDLCALC, TRIG, CHOLHDL, LDLDIRECT in the last 72 hours. Thyroid function studies:  Recent Labs  10/09/15 0843  TSH 3.274   Anemia work up:  Recent Labs  10/09/15 0843  VITAMINB12 266  FOLATE 12.9  FERRITIN 295  TIBC 256  IRON 27*  RETICCTPCT 1.5   Sepsis Labs:  Recent Labs Lab 10/09/15 0425 10/09/15 0843 10/09/15 1745 10/10/15 0607  WBC 8.0 8.8 6.0 7.7   Microbiology No results found for this or any previous visit (from the past 240 hour(s)).   Medications:   . amiodarone  200 mg Oral Daily  . clindamycin (CLEOCIN) IV  300 mg Intravenous Q12H  . insulin aspart  0-9 Units Subcutaneous 6 times per day  . metoprolol succinate  25 mg Oral BID  . pantoprazole (PROTONIX) IV  40 mg Intravenous Q12H  . sodium chloride flush  3 mL Intravenous Q12H   Continuous Infusions: . sodium chloride 50 mL/hr at 10/09/15 1102    Time spent: 25 min   LOS: 1 day   Charlynne Cousins  [facility] Pager [phone]  *Please refer to [email or web address], password [email or web address] to get updated schedule on who will round on this patient, as hospitalists switch teams weekly. If 7PM-7AM, please contact night-coverage at [email or web address], password [email or web address] for any overnight needs.  10/10/2015, 6:59 AM

## 2015-10-11 ENCOUNTER — Encounter (HOSPITAL_COMMUNITY): Payer: Self-pay | Admitting: Anesthesiology

## 2015-10-11 ENCOUNTER — Observation Stay (HOSPITAL_COMMUNITY): Payer: Commercial Managed Care - HMO

## 2015-10-11 ENCOUNTER — Other Ambulatory Visit: Payer: Self-pay

## 2015-10-11 DIAGNOSIS — R0602 Shortness of breath: Secondary | ICD-10-CM | POA: Diagnosis not present

## 2015-10-11 DIAGNOSIS — I8312 Varicose veins of left lower extremity with inflammation: Secondary | ICD-10-CM | POA: Diagnosis not present

## 2015-10-11 DIAGNOSIS — I480 Paroxysmal atrial fibrillation: Secondary | ICD-10-CM | POA: Diagnosis not present

## 2015-10-11 DIAGNOSIS — D62 Acute posthemorrhagic anemia: Secondary | ICD-10-CM | POA: Diagnosis not present

## 2015-10-11 DIAGNOSIS — L97529 Non-pressure chronic ulcer of other part of left foot with unspecified severity: Secondary | ICD-10-CM | POA: Insufficient documentation

## 2015-10-11 DIAGNOSIS — K922 Gastrointestinal hemorrhage, unspecified: Secondary | ICD-10-CM

## 2015-10-11 DIAGNOSIS — L97521 Non-pressure chronic ulcer of other part of left foot limited to breakdown of skin: Secondary | ICD-10-CM

## 2015-10-11 LAB — CBC
HCT: 30.3 % — ABNORMAL LOW (ref 36.0–46.0)
Hemoglobin: 10 g/dL — ABNORMAL LOW (ref 12.0–15.0)
MCH: 34.1 pg — AB (ref 26.0–34.0)
MCHC: 33 g/dL (ref 30.0–36.0)
MCV: 103.4 fL — ABNORMAL HIGH (ref 78.0–100.0)
PLATELETS: 229 10*3/uL (ref 150–400)
RBC: 2.93 MIL/uL — ABNORMAL LOW (ref 3.87–5.11)
RDW: 17 % — AB (ref 11.5–15.5)
WBC: 7.8 10*3/uL (ref 4.0–10.5)

## 2015-10-11 LAB — GLUCOSE, CAPILLARY
Glucose-Capillary: 128 mg/dL — ABNORMAL HIGH (ref 65–99)
Glucose-Capillary: 137 mg/dL — ABNORMAL HIGH (ref 65–99)
Glucose-Capillary: 120 mg/dL — ABNORMAL HIGH (ref 65–99)
Glucose-Capillary: 141 mg/dL — ABNORMAL HIGH (ref 65–99)
Glucose-Capillary: 123 mg/dL — ABNORMAL HIGH (ref 65–99)
Glucose-Capillary: 144 mg/dL — ABNORMAL HIGH (ref 65–99)

## 2015-10-11 LAB — PROTIME-INR
INR: 1.55 — ABNORMAL HIGH (ref 0.00–1.49)
PROTHROMBIN TIME: 18.7 s — AB (ref 11.6–15.2)

## 2015-10-11 MED ORDER — BISACODYL 5 MG PO TBEC: 10.0000 mg | DELAYED_RELEASE_TABLET | Freq: Every day | ORAL | Status: DC | PRN

## 2015-10-11 MED ORDER — METOCLOPRAMIDE HCL 5 MG/ML IJ SOLN: 10.0000 mg | Freq: Four times a day (QID) | INTRAMUSCULAR | Status: AC | PRN

## 2015-10-11 MED ORDER — PEG-KCL-NACL-NASULF-NA ASC-C 100 G PO SOLR: 1.0000 | Freq: Once | ORAL | Status: DC

## 2015-10-11 MED ORDER — ALPRAZOLAM 0.5 MG PO TABS
0.5000 mg | ORAL_TABLET | Freq: Once | ORAL | Status: AC
  Administered 2015-10-11: 0.5 mg via ORAL
  Filled 2015-10-11: qty 1

## 2015-10-11 MED ORDER — FUROSEMIDE 10 MG/ML IJ SOLN
40.0000 mg | Freq: Once | INTRAMUSCULAR | Status: AC
  Administered 2015-10-11: 40 mg via INTRAVENOUS
  Filled 2015-10-11: qty 4

## 2015-10-11 MED ORDER — PEG-KCL-NACL-NASULF-NA ASC-C 100 G PO SOLR
0.5000 | Freq: Once | ORAL | Status: DC
  Filled 2015-10-11: qty 1

## 2015-10-11 MED ORDER — FUROSEMIDE 10 MG/ML IJ SOLN
40.0000 mg | Freq: Once | INTRAMUSCULAR | Status: AC
  Administered 2015-10-11: 40 mg via INTRAVENOUS

## 2015-10-11 MED ORDER — PEG-KCL-NACL-NASULF-NA ASC-C 100 G PO SOLR: 0.5000 | Freq: Once | ORAL | Status: DC

## 2015-10-11 MED ORDER — METOPROLOL SUCCINATE ER 50 MG PO TB24
50.0000 mg | ORAL_TABLET | Freq: Two times a day (BID) | ORAL | Status: DC
  Administered 2015-10-11 – 2015-10-14 (×7): 50 mg via ORAL
  Filled 2015-10-11 (×7): qty 1

## 2015-10-11 MED ORDER — ALBUTEROL SULFATE (2.5 MG/3ML) 0.083% IN NEBU
2.5000 mg | INHALATION_SOLUTION | Freq: Four times a day (QID) | RESPIRATORY_TRACT | Status: DC | PRN
  Administered 2015-10-11: 2.5 mg via RESPIRATORY_TRACT
  Filled 2015-10-11: qty 3

## 2015-10-11 MED ORDER — DOXYCYCLINE HYCLATE 100 MG PO TABS
100.0000 mg | ORAL_TABLET | Freq: Two times a day (BID) | ORAL | Status: DC
  Administered 2015-10-11 – 2015-10-14 (×7): 100 mg via ORAL
  Filled 2015-10-11 (×7): qty 1

## 2015-10-11 MED ORDER — FUROSEMIDE 10 MG/ML IJ SOLN
INTRAMUSCULAR | Status: AC
  Filled 2015-10-11: qty 4

## 2015-10-11 MED ORDER — PHENOL 1.4 % MT LIQD
1.0000 | OROMUCOSAL | Status: DC | PRN
  Administered 2015-10-11: 1 via OROMUCOSAL
  Filled 2015-10-11: qty 177

## 2015-10-11 NOTE — Progress Notes (Signed)
Patient said she felt like choking and unable to breath, put her on 3 L O2, took VS and also 12 lead EKG called Rapid response and paged Dr. Clementeen Graham.  Gave orders to give 40 mg of lasix and Metoprolol 5 mg IV.  Administered the medications, will also give  Xanax and Albuterol treatment.  Called Karen Chafe, PA and made her aware of the situation, she said to hold the movieprep which was supposed to be started around 6 pm.  She will on clear liquid diet and will continue to monitor.

## 2015-10-11 NOTE — Progress Notes (Signed)
[facility] PROGRESS NOTE  Shannon [name] [id number] DOB: January 14, 1932 DOA: 10/09/2015 PCP: Haywood Pao, MD  brief narrative 82 year old female with history of A. fib on Pradaxa, coronary artery disease, type 2 diabetes mellitus, left great toe ulcer (recently saw Dr. Con Memos at the wound care center) presented with acute lower GI bleed.  Assessment/Plan: Acute lower GI bleed/blood loss anemia Mild drop in H&H but stable. Held Pradaxa. [facility] following with plan on colonoscopy tomorrow.  Chronic A. fib with RVR Patient appears quite deconditioned with rapid A. fib on minimal exertion. EKG done showed junctional rhythm. I've increased her dose of beta blocker with improvement. Continue amiodarone.  Shortness of breath Likely due to deconditioning and mild volume overload on chest x-ray this morning. Ordered 1 dose of IV Lasix and monitor. 2-D echo from 04/2015 shows EF of 50-55% with no wall motion abnormality.   Diabetes mellitus type 2 CBG stable. Continue sliding scale coverage.   Coronary artery disease Stable. Continue beta blocker.  Left great toe ulcer with history of chronic venous stasis MRI of the left foot done negative for osteomyelitis, cannot rule out marrow infection. Was started on empiric clindamycin. Given risk for C. difficile I will switch to oral doxycycline. Seen by wound care and applied dressing. Patient recently saw Dr. Con Memos at the wound care clinic. I have ordered for an ABI. Will treat her with antibiotics and have her follow-up at the wound care clinic.   DVT prophylaxis: SCDs  diet: Clear liquid, NPO after midnight   Code Status: Full code Family Communication: None at bedside Disposition Plan: Pending workup (colonoscopy). PT recommends home health   Consultants:  About GI  Procedures:  MRI left foot  Antibiotics:  IV clindamycin 4/10-4/11  Oral doxycycline 4/11--  HPI/Subjective: Seen and examined. Was  tachycardic on minimal exertion and mildly dyspneic. Reports feeling very tired.  Objective: Filed Vitals:   10/11/15 0415 10/11/15 0742  BP: 102/62 124/91  Pulse: 98 63  Temp: 98.4 F (36.9 C) 97.9 F (36.6 C)  Resp: 19 20    Intake/Output Summary (Last 24 hours) at 10/11/15 1500 Last data filed at 10/11/15 1245  Gross per 24 hour  Intake    748 ml  Output    550 ml  Net    198 ml   Filed Weights   10/09/15 0349 10/09/15 1924 10/10/15 2002  Weight: 67.586 kg (149 lb) 78.6 kg (173 lb 4.5 oz) 78.4 kg (172 lb 13.5 oz)    Exam:   General:  Elderly female appears fatigued  HEENT: Pallor present, moist mucosa, supple neck  Chest: Diminished bilateral breath sounds, no added sounds  CVS: S1 and S2 tachycardic, irregular, no murmurs rub or gallop  GI: Soft, nondistended, nontender, bowel sounds present   Musculoskeletal: Ace wrap over bilateral leg, left great toe ulcer without any bleeding or discharge  CNS: Alert and oriented  Data Reviewed: Basic Metabolic Panel:  Recent Labs Lab 10/09/15 0425 10/10/15 0607  NA 136 139  K 3.5 4.1  CL 101 105  CO2 24 25  GLUCOSE 165* 127*  BUN 18 13  CREATININE 1.11* 0.83  CALCIUM 8.5* 8.1*   Liver Function Tests:  Recent Labs Lab 10/09/15 0425 10/10/15 0607  AST 45* 56*  ALT 33 40  ALKPHOS 76 65  BILITOT 0.9 0.6  PROT 5.6* 5.0*  ALBUMIN 2.6* 2.3*   No results for input(s): LIPASE, AMYLASE in the last 168 hours. No results for input(s): AMMONIA in  the last 168 hours. CBC:  Recent Labs Lab 10/09/15 0425 10/09/15 0843 10/09/15 1745 10/10/15 0607 10/11/15 0440  WBC 8.0 8.8 6.0 7.7 7.8  NEUTROABS  --  7.0  --   --   --   HGB 10.0* 10.5* 9.7* 10.3* 10.0*  HCT 31.0* 33.2* 30.5* 32.0* 30.3*  MCV 101.3* 101.5* 102.0* 102.2* 103.4*  PLT 232 230 198 231 229   Cardiac Enzymes: No results for input(s): CKTOTAL, CKMB, CKMBINDEX, TROPONINI in the last 168 hours. BNP (last 3 results) No results for input(s): BNP  in the last 8760 hours.  ProBNP (last 3 results) No results for input(s): PROBNP in the last 8760 hours.  CBG:  Recent Labs Lab 10/10/15 2001 10/11/15 0104 10/11/15 0413 10/11/15 0742 10/11/15 1152  GLUCAP 132* 128* 137* 120* 141*    No results found for this or any previous visit (from the past 240 hour(s)).   Studies: Mr Foot Left Wo Contrast  10/10/2015  CLINICAL DATA:  Diabetic with chronic venous stasis, ulcer and cellulitis of the great toe. No reported drainage. EXAM: MRI OF THE LEFT FOREFOOT WITHOUT CONTRAST TECHNIQUE: Multiplanar, multisequence MR imaging was performed. No intravenous contrast was administered. COMPARISON:  Radiographs 10/05/2015. FINDINGS: As demonstrated on prior radiographs, there is soft tissue ulceration over the tip of the great toe. No underlying focal fluid collection, foreign body or soft tissue emphysema is seen. There is mild generalized subcutaneous edema throughout the dorsum of the left foot. There is low-level T2 hyperintensity within the distal first phalanx. No cortical erosion or other abnormality on T1 weighted images demonstrated. There is also nonspecific T2 hyperintensity within the middle phalanx of the small toe. Again, there is no apparent cortical destruction in this area on T1 weighted images. The second, third and fourth rays appear unremarkable. The visualized Lisfranc joint appears unremarkable. IMPRESSION: 1. Nonspecific low level T2 hyperintensity within the distal first phalanx adjacent to overlying skin ulceration. There is no abnormal T1 signal to confirm osteomyelitis, although given the proximity to adjacent soft tissue ulceration, marrow infection cannot be excluded. 2. Nonspecific T2 hyperintensity within the middle phalanx of the small toe. The small toe was not imaged on prior radiographs. 3. Mild nonspecific subcutaneous edema within the forefoot without focal fluid collection to suggest abscess. Electronically Signed   By:  Richardean Sale M.D.   On: 10/10/2015 13:51   Dg Chest Port 1 View  10/11/2015  CLINICAL DATA:  Shortness of breath, weakness EXAM: PORTABLE CHEST 1 VIEW COMPARISON:  10/07/2012 FINDINGS: Small bilateral pleural effusions. Bilateral interstitial thickening. No focal consolidation. No pneumothorax. Stable cardiomediastinal silhouette. Mild degenerative changes of bilateral AC joints. IMPRESSION: Findings concerning for mild CHF. Electronically Signed   By: Kathreen Devoid   On: 10/11/2015 12:42    Scheduled Meds: . amiodarone  200 mg Oral Daily  . clindamycin (CLEOCIN) IV  300 mg Intravenous Q12H  . furosemide  40 mg Intravenous Once  . insulin aspart  0-9 Units Subcutaneous 6 times per day  . metoprolol succinate  50 mg Oral BID  . peg 3350 powder  0.5 kit Oral Once   And  . [START ON 10/12/2015] peg 3350 powder  0.5 kit Oral Once  . sodium chloride flush  3 mL Intravenous Q12H   Continuous Infusions: . sodium chloride 10 mL/hr at 10/10/15 0711      Time spent: 25 minutes    Aniel Hubble, [place]  [facility] Pager [phone]. If 7PM-7AM, please contact night-coverage at [email or web address], password  [email or web address] 10/11/2015, 3:00 PM  LOS: 2 days

## 2015-10-11 NOTE — Significant Event (Signed)
Rapid Response Event Note  Overview: Time Called: [id number] Arrival Time: 1750 Event Type: Cardiac  Initial Focused Assessment:  Called to patients room for "second set of eyes".  Upon my arrival to patients room, RN at bedside, and patient sitting on bedside commode.  As per RN patient c/o choking and not being able to breath.  RN bumped nasal cannula up to 3 lpm.  Breath Sounds wheeze noted.  On tele monitor noted to be in afib with rvr 170's, RR 25 BP 136/108.   "  Interventions:  MD paged and updated, EKG done.  Orders received from MD.  RN to call if assistance needed   Event Summary:   at      at          [facility], Harlin Rain

## 2015-10-11 NOTE — Progress Notes (Signed)
Physical Therapy Treatment Patient Details Name: Shannon [name] MRN: [id number] DOB: 03/28/1932 Today's Date: 10/11/2015    History of Present Illness Shannon [name] is a 82 y.o. female with a past medical history significant for CAD, atrial fibrillation on anticoagulation of Pradaxa, MVP, OSA, DM type 2, possible PUD, and HLD; who presented with complaints of rectal bleeding since this morning.     PT Comments    Pt refusing to attempt any ambulation today, stating that she is just feeling terrible. Repeated encouragement provided but pt continuing to refuse. She was agreeable to LE exercises. PT will continue to follow and attempt to progress mobility in anticipation of D/C to home.   Follow Up Recommendations  Home health PT;Supervision - Intermittent     Equipment Recommendations  Other (comment) (rollator with 4 wheels and seat)    Recommendations for Other Services       Precautions / Restrictions Precautions Precautions: Fall Restrictions Weight Bearing Restrictions: No    Mobility  Bed Mobility               General bed mobility comments: pt in chair upon arrival  Transfers                 General transfer comment: pt refusing any standing or ambulation  Ambulation/Gait             General Gait Details: pt refusal   Stairs            Wheelchair Mobility    Modified Rankin (Stroke Patients Only)       Balance                                    Cognition Arousal/Alertness: Awake/alert Behavior During Therapy: WFL for tasks assessed/performed Overall Cognitive Status: Within Functional Limits for tasks assessed                      Exercises Total Joint Exercises Quad Sets: Strengthening;Both;10 reps Short Arc Quad: Strengthening;Both;10 reps Heel Slides: AAROM;Both;10 reps Hip ABduction/ADduction: Strengthening;Both;10 reps Straight Leg Raises: Strengthening;Both;10 reps    General  Comments        Pertinent Vitals/Pain Pain Assessment: No/denies pain    Home Living                      Prior Function            PT Goals (current goals can now be found in the care plan section) Acute Rehab PT Goals Patient Stated Goal: feel better PT Goal Formulation: With patient Time For Goal Achievement: 10/24/15 Potential to Achieve Goals: Good Progress towards PT goals: Not progressing toward goals - comment (pt unwilling to attempt ambulation today. )    Frequency  Min 3X/week    PT Plan Current plan remains appropriate    Co-evaluation             End of Session   Activity Tolerance: Patient tolerated treatment well Patient left: in chair;with call bell/phone within reach     Time: 1103-1120 PT Time Calculation (min) (ACUTE ONLY): 17 min  Charges:  $Therapeutic Exercise: 8-22 mins                    G Codes:      Cassell Clement, PT, CSCS Pager [phone] Office [phone]  10/11/2015,  3:11 PM

## 2015-10-11 NOTE — Progress Notes (Signed)
Daily Rounding Note  10/11/2015, 10:09 AM  LOS: 2 days   SUBJECTIVE:       Anxious and feeling SOB this AM.  Not hypoxic.  Tachy to 120s.  CXR just completed.  Soft BM today.   OBJECTIVE:         Vital signs in last 24 hours:    Temp:  [97.9 F (36.6 C)-98.4 F (36.9 C)] 97.9 F (36.6 C) (04/11 0742) Pulse Rate:  [63-98] 63 (04/11 0742) Resp:  [18-20] 20 (04/11 0742) BP: (99-124)/(50-91) 124/91 mmHg (04/11 0742) SpO2:  [92 %-100 %] 92 % (04/11 0742) Weight:  [78.4 kg (172 lb 13.5 oz)] 78.4 kg (172 lb 13.5 oz) (04/10 2002) Last BM Date: 10/11/15 Filed Weights   10/09/15 0349 10/09/15 1924 10/10/15 2002  Weight: 67.586 kg (149 lb) 78.6 kg (173 lb 4.5 oz) 78.4 kg (172 lb 13.5 oz)   General: looks unwell, anxious   Heart: irregular, tachy to 120s, then down into 80s/90s Chest: clear bil.  Abdomen: NT, no bruising, active BS.  Soft, obese  Extremities: + Bil LE edema Neuro/Psych:  Oriented x 3.  Anxious, cooperative.   Intake/Output from previous day: 04/10 0701 - 04/11 0700 In: 1108 [P.O.:720; I.V.:288; IV Piggyback:100] Out: 1450 [Urine:1450]  Intake/Output this shift:    Lab Results:  Recent Labs  10/09/15 1745 10/10/15 0607 10/11/15 0440  WBC 6.0 7.7 7.8  HGB 9.7* 10.3* 10.0*  HCT 30.5* 32.0* 30.3*  PLT 198 231 229   BMET  Recent Labs  10/09/15 0425 10/10/15 0607  NA 136 139  K 3.5 4.1  CL 101 105  CO2 24 25  GLUCOSE 165* 127*  BUN 18 13  CREATININE 1.11* 0.83  CALCIUM 8.5* 8.1*   LFT  Recent Labs  10/09/15 0425 10/10/15 0607  PROT 5.6* 5.0*  ALBUMIN 2.6* 2.3*  AST 45* 56*  ALT 33 40  ALKPHOS 76 65  BILITOT 0.9 0.6   PT/INR  Recent Labs  10/10/15 0607 10/11/15 0440  LABPROT 22.2* 18.7*  INR 1.96* 1.55*   Hepatitis Panel No results for input(s): HEPBSAG, HCVAB, HEPAIGM, HEPBIGM in the last 72 hours.  Studies/Results: Mr Foot Left Wo Contrast  10/10/2015  CLINICAL  DATA:  Diabetic with chronic venous stasis, ulcer and cellulitis of the great toe. No reported drainage. EXAM: MRI OF THE LEFT FOREFOOT WITHOUT CONTRAST TECHNIQUE: Multiplanar, multisequence MR imaging was performed. No intravenous contrast was administered. COMPARISON:  Radiographs 10/05/2015. FINDINGS: As demonstrated on prior radiographs, there is soft tissue ulceration over the tip of the great toe. No underlying focal fluid collection, foreign body or soft tissue emphysema is seen. There is mild generalized subcutaneous edema throughout the dorsum of the left foot. There is low-level T2 hyperintensity within the distal first phalanx. No cortical erosion or other abnormality on T1 weighted images demonstrated. There is also nonspecific T2 hyperintensity within the middle phalanx of the small toe. Again, there is no apparent cortical destruction in this area on T1 weighted images. The second, third and fourth rays appear unremarkable. The visualized Lisfranc joint appears unremarkable. IMPRESSION: 1. Nonspecific low level T2 hyperintensity within the distal first phalanx adjacent to overlying skin ulceration. There is no abnormal T1 signal to confirm osteomyelitis, although given the proximity to adjacent soft tissue ulceration, marrow infection cannot be excluded. 2. Nonspecific T2 hyperintensity within the middle phalanx of the small toe. The small toe was not imaged on prior radiographs. 3. Mild  nonspecific subcutaneous edema within the forefoot without focal fluid collection to suggest abscess. Electronically Signed   By: Richardean Sale M.D.   On: 10/10/2015 13:51    ASSESMENT:   * Bleeding PR. No previous colonoscopy.   * Macrocytic anemia. Iron studies with low iron but normal Ferritin, TIBC. Folate, B12 normal.  Hgb improved. No transfusions to date.   * Peripheral vascular disease. ? Osteomyelitis of toe? Clinda Rx'd 10/05/15..   * S/p 2013 DES for CAD. Hx A fib/flutter. On  Pradaxa. Last dose was 4/8   * DM type 2. Glucophage at home.   * OSA, EF 50 to 55%.     PLAN   *  Colonoscopy noon 4/12 if clinical status stable.  Will assess later today to see if ok to proceed with prep.  Give dulcolax for now.      Azucena Freed  10/11/2015, 10:09 AM Pager: [phone]

## 2015-10-11 NOTE — Care Management Obs Status (Signed)
[place] NOTIFICATION   Patient Details  Name: Shannon [name] MRN: [id number] Date of Birth: 02/05/1932   Medicare Observation Status Notification Given:  Yes    Sabastian Raimondi, Rory Percy, RN 10/11/2015, 11:44 AM

## 2015-10-12 ENCOUNTER — Encounter (HOSPITAL_COMMUNITY)
Admission: EM | Disposition: A | Discharge status: Home Health | Payer: Self-pay | Source: Home / Self Care | Attending: Internal Medicine

## 2015-10-12 ENCOUNTER — Observation Stay (HOSPITAL_BASED_OUTPATIENT_CLINIC_OR_DEPARTMENT_OTHER): Payer: Commercial Managed Care - HMO

## 2015-10-12 DIAGNOSIS — Z87891 Personal history of nicotine dependence: Secondary | ICD-10-CM | POA: Diagnosis not present

## 2015-10-12 DIAGNOSIS — K922 Gastrointestinal hemorrhage, unspecified: Secondary | ICD-10-CM | POA: Diagnosis not present

## 2015-10-12 DIAGNOSIS — Z955 Presence of coronary angioplasty implant and graft: Secondary | ICD-10-CM | POA: Diagnosis not present

## 2015-10-12 DIAGNOSIS — I252 Old myocardial infarction: Secondary | ICD-10-CM | POA: Diagnosis not present

## 2015-10-12 DIAGNOSIS — Z9181 History of falling: Secondary | ICD-10-CM | POA: Diagnosis not present

## 2015-10-12 DIAGNOSIS — E11621 Type 2 diabetes mellitus with foot ulcer: Secondary | ICD-10-CM | POA: Diagnosis present

## 2015-10-12 DIAGNOSIS — K921 Melena: Secondary | ICD-10-CM | POA: Diagnosis present

## 2015-10-12 DIAGNOSIS — I5033 Acute on chronic diastolic (congestive) heart failure: Secondary | ICD-10-CM | POA: Diagnosis present

## 2015-10-12 DIAGNOSIS — M1990 Unspecified osteoarthritis, unspecified site: Secondary | ICD-10-CM | POA: Diagnosis present

## 2015-10-12 DIAGNOSIS — D122 Benign neoplasm of ascending colon: Secondary | ICD-10-CM | POA: Diagnosis present

## 2015-10-12 DIAGNOSIS — Z79899 Other long term (current) drug therapy: Secondary | ICD-10-CM | POA: Diagnosis not present

## 2015-10-12 DIAGNOSIS — R0602 Shortness of breath: Secondary | ICD-10-CM | POA: Diagnosis present

## 2015-10-12 DIAGNOSIS — D123 Benign neoplasm of transverse colon: Secondary | ICD-10-CM | POA: Diagnosis present

## 2015-10-12 DIAGNOSIS — Z8673 Personal history of transient ischemic attack (TIA), and cerebral infarction without residual deficits: Secondary | ICD-10-CM | POA: Diagnosis not present

## 2015-10-12 DIAGNOSIS — I4892 Unspecified atrial flutter: Secondary | ICD-10-CM | POA: Diagnosis present

## 2015-10-12 DIAGNOSIS — K648 Other hemorrhoids: Secondary | ICD-10-CM | POA: Diagnosis present

## 2015-10-12 DIAGNOSIS — E785 Hyperlipidemia, unspecified: Secondary | ICD-10-CM | POA: Diagnosis present

## 2015-10-12 DIAGNOSIS — L97529 Non-pressure chronic ulcer of other part of left foot with unspecified severity: Secondary | ICD-10-CM | POA: Diagnosis present

## 2015-10-12 DIAGNOSIS — D62 Acute posthemorrhagic anemia: Secondary | ICD-10-CM | POA: Diagnosis present

## 2015-10-12 DIAGNOSIS — I480 Paroxysmal atrial fibrillation: Secondary | ICD-10-CM | POA: Diagnosis not present

## 2015-10-12 DIAGNOSIS — Z7984 Long term (current) use of oral hypoglycemic drugs: Secondary | ICD-10-CM | POA: Diagnosis not present

## 2015-10-12 DIAGNOSIS — I8310 Varicose veins of unspecified lower extremity with inflammation: Secondary | ICD-10-CM

## 2015-10-12 DIAGNOSIS — L03032 Cellulitis of left toe: Secondary | ICD-10-CM | POA: Diagnosis present

## 2015-10-12 DIAGNOSIS — I872 Venous insufficiency (chronic) (peripheral): Secondary | ICD-10-CM | POA: Diagnosis present

## 2015-10-12 DIAGNOSIS — G4733 Obstructive sleep apnea (adult) (pediatric): Secondary | ICD-10-CM | POA: Diagnosis present

## 2015-10-12 DIAGNOSIS — I2510 Atherosclerotic heart disease of native coronary artery without angina pectoris: Secondary | ICD-10-CM | POA: Diagnosis present

## 2015-10-12 DIAGNOSIS — L97522 Non-pressure chronic ulcer of other part of left foot with fat layer exposed: Secondary | ICD-10-CM

## 2015-10-12 DIAGNOSIS — I482 Chronic atrial fibrillation: Secondary | ICD-10-CM | POA: Diagnosis present

## 2015-10-12 DIAGNOSIS — I341 Nonrheumatic mitral (valve) prolapse: Secondary | ICD-10-CM | POA: Diagnosis present

## 2015-10-12 DIAGNOSIS — I878 Other specified disorders of veins: Secondary | ICD-10-CM | POA: Diagnosis present

## 2015-10-12 DIAGNOSIS — K5730 Diverticulosis of large intestine without perforation or abscess without bleeding: Secondary | ICD-10-CM | POA: Diagnosis present

## 2015-10-12 LAB — GLUCOSE, CAPILLARY
GLUCOSE-CAPILLARY: 110 mg/dL — AB (ref 65–99)
GLUCOSE-CAPILLARY: 116 mg/dL — AB (ref 65–99)
GLUCOSE-CAPILLARY: 138 mg/dL — AB (ref 65–99)
Glucose-Capillary: 108 mg/dL — ABNORMAL HIGH (ref 65–99)
Glucose-Capillary: 124 mg/dL — ABNORMAL HIGH (ref 65–99)
Glucose-Capillary: 133 mg/dL — ABNORMAL HIGH (ref 65–99)

## 2015-10-12 LAB — CBC
HCT: 31.5 % — ABNORMAL LOW (ref 36.0–46.0)
HEMOGLOBIN: 10.4 g/dL — AB (ref 12.0–15.0)
MCH: 33.9 pg (ref 26.0–34.0)
MCHC: 33 g/dL (ref 30.0–36.0)
MCV: 102.6 fL — ABNORMAL HIGH (ref 78.0–100.0)
Platelets: 202 10*3/uL (ref 150–400)
RBC: 3.07 MIL/uL — AB (ref 3.87–5.11)
RDW: 16.8 % — ABNORMAL HIGH (ref 11.5–15.5)
WBC: 8.2 10*3/uL (ref 4.0–10.5)

## 2015-10-12 LAB — BASIC METABOLIC PANEL
Anion gap: 10 (ref 5–15)
BUN: 7 mg/dL (ref 6–20)
CALCIUM: 8.3 mg/dL — AB (ref 8.9–10.3)
CO2: 28 mmol/L (ref 22–32)
CREATININE: 0.78 mg/dL (ref 0.44–1.00)
Chloride: 103 mmol/L (ref 101–111)
GFR calc Af Amer: >60 mL/min (ref >60)
GLUCOSE: 115 mg/dL — AB (ref 65–99)
Potassium: 3.8 mmol/L (ref 3.5–5.1)
Sodium: 141 mmol/L (ref 135–145)

## 2015-10-12 SURGERY — COLONOSCOPY
Anesthesia: Monitor Anesthesia Care

## 2015-10-12 MED ORDER — PEG-KCL-NACL-NASULF-NA ASC-C 100 G PO SOLR
0.5000 | Freq: Once | ORAL | Status: AC
  Administered 2015-10-13: 100 g via ORAL

## 2015-10-12 MED ORDER — PEG-KCL-NACL-NASULF-NA ASC-C 100 G PO SOLR
0.5000 | Freq: Once | ORAL | Status: AC
  Administered 2015-10-12: 100 g via ORAL
  Filled 2015-10-12: qty 1

## 2015-10-12 MED ORDER — FUROSEMIDE 10 MG/ML IJ SOLN
20.0000 mg | Freq: Every day | INTRAMUSCULAR | Status: DC
  Administered 2015-10-12 – 2015-10-14 (×3): 20 mg via INTRAVENOUS
  Filled 2015-10-12 (×3): qty 2

## 2015-10-12 NOTE — Progress Notes (Signed)
[facility] PROGRESS NOTE  Shannon [name] VOZ:[id number] DOB: 22-Dec-1931 DOA: 10/09/2015 PCP: Haywood Pao, MD  HPI/Brief narrative 82 year old female with history of A. fib on Pradaxa, coronary artery disease, type 2 diabetes mellitus, left great toe ulcer (recently saw Dr. Con Memos at the wound care center) presented with acute lower GI bleed.  Assessment/Plan: Acute lower GI bleed/blood loss anemia Hgb has remained stable thus far. Holding Pradaxa. [facility] following initially with plan for colonoscopy 4/12, however, pt was not stable for bowel prep the night prior. Thus, plan for colonoscopy 4/13  Chronic A. fib with RVR Patient appears quite deconditioned with rapid A. fib on minimal exertion. EKG done showed junctional rhythm. Continued increased beta blocker with improvement. Continue amiodarone.  Shortness of breath likely secondary to acutely decompensated diastolic CHF, chronicity unknown Likely due to deconditioning and mild volume overload on chest x-ray. 2-D echo from 04/2015 shows EF of 50-55% with no wall motion abnormality. Patient has improved with lasix.  Diabetes mellitus type 2 CBG stable. Continue sliding scale coverage.  Coronary artery disease Stable. Continue beta blocker.  Left great toe ulcer with history of chronic venous stasis MRI of the left foot done negative for osteomyelitis, cannot rule out marrow infection. Was started on empiric clindamycin. Given risk for C. difficile, transitioned to oral doxycycline. Seen by wound care and applied dressing with Ardelia Mems boot Patient recently saw Dr. Con Memos at the wound care clinic. ABI unremarkable. Will treat her with antibiotics and have her follow-up at the wound care clinic.  Code Status: Full Family Communication: Pt in room Disposition Plan: Uncertain at this time   Consultants:  GI  Procedures:    Antibiotics: Anti-infectives    Start     Dose/Rate Route Frequency Ordered Stop    10/11/15 1515  doxycycline (VIBRA-TABS) tablet 100 mg     100 mg Oral Every 12 hours 10/11/15 1509     10/09/15 1000  clindamycin (CLEOCIN) IVPB 300 mg  Status:  Discontinued     300 mg 100 mL/hr over 30 Minutes Intravenous Every 12 hours 10/09/15 0752 10/11/15 1509      HPI/Subjective: Reports breathing better today  Objective: Filed Vitals:   10/11/15 1958 10/12/15 0419 10/12/15 0848 10/12/15 1750  BP: 130/74 125/68 111/73 108/80  Pulse: 104 98 93 88  Temp: 98.7 F (37.1 C) 98.4 F (36.9 C) 98.1 F (36.7 C) 98.8 F (37.1 C)  TempSrc: Oral Oral Oral Oral  Resp: _0 Height:      Weight: 78.2 kg (172 lb 6.4 oz)     SpO2: 95% 95% 95% 96%    Intake/Output Summary (Last 24 hours) at 10/12/15 1837 Last data filed at 10/12/15 1832  Gross per 24 hour  Intake 1319.5 ml  Output   4300 ml  Net -2980.5 ml   Filed Weights   10/09/15 1924 10/10/15 2002 10/11/15 1958  Weight: 78.6 kg (173 lb 4.5 oz) 78.4 kg (172 lb 13.5 oz) 78.2 kg (172 lb 6.4 oz)    Exam:   General:  Awake, in nad  Cardiovascular: regular, s1, s2  Respiratory: normal resp effort, no wheezing  Abdomen: soft, nondistended  Musculoskeletal: perfused, no clubbing   Data Reviewed: Basic Metabolic Panel:  Recent Labs Lab 10/09/15 0425 10/10/15 0607 10/12/15 0817  NA 136 139 141  K 3.5 4.1 3.8  CL 101 105 103  CO2 _1 GLUCOSE 165* 127* 115*  BUN _2 CREATININE 1.11*  0.83 0.78  CALCIUM 8.5* 8.1* 8.3*   Liver Function Tests:  Recent Labs Lab 10/09/15 0425 10/10/15 0607  AST 45* 56*  ALT 33 40  ALKPHOS 76 65  BILITOT 0.9 0.6  PROT 5.6* 5.0*  ALBUMIN 2.6* 2.3*   No results for input(s): LIPASE, AMYLASE in the last 168 hours. No results for input(s): AMMONIA in the last 168 hours. CBC:  Recent Labs Lab 10/09/15 0843 10/09/15 1745 10/10/15 0607 10/11/15 0440 10/12/15 0817  WBC 8.8 6.0 7.7 7.8 8.2  NEUTROABS 7.0  --   --   --   --   HGB 10.5* 9.7* 10.3* 10.0*  10.4*  HCT 33.2* 30.5* 32.0* 30.3* 31.5*  MCV 101.5* 102.0* 102.2* 103.4* 102.6*  PLT 230 198 231 229 202   Cardiac Enzymes: No results for input(s): CKTOTAL, CKMB, CKMBINDEX, TROPONINI in the last 168 hours. BNP (last 3 results) No results for input(s): BNP in the last 8760 hours.  ProBNP (last 3 results) No results for input(s): PROBNP in the last 8760 hours.  CBG:  Recent Labs Lab 10/11/15 2358 10/12/15 0416 10/12/15 0737 10/12/15 1207 10/12/15 1610  GLUCAP 116* 108* 110* 138* 124*    No results found for this or any previous visit (from the past 240 hour(s)).   Studies: Dg Chest Port 1 View  10/11/2015  CLINICAL DATA:  Shortness of breath, weakness EXAM: PORTABLE CHEST 1 VIEW COMPARISON:  10/07/2012 FINDINGS: Small bilateral pleural effusions. Bilateral interstitial thickening. No focal consolidation. No pneumothorax. Stable cardiomediastinal silhouette. Mild degenerative changes of bilateral AC joints. IMPRESSION: Findings concerning for mild CHF. Electronically Signed   By: Kathreen Devoid   On: 10/11/2015 12:42    Scheduled Meds: . amiodarone  200 mg Oral Daily  . doxycycline  100 mg Oral Q12H  . furosemide  20 mg Intravenous Daily  . insulin aspart  0-9 Units Subcutaneous 6 times per day  . metoprolol succinate  50 mg Oral BID  . [START ON 10/13/2015] peg 3350 powder  0.5 kit Oral Once  . sodium chloride flush  3 mL Intravenous Q12H   Continuous Infusions: . sodium chloride 10 mL/hr at 10/10/15 0301    Principal Problem:   Acute GI bleeding Active Problems:   CAD (coronary artery disease)   Hyperlipidemia   Diabetes mellitus (HCC)   Atrial fibrillation ([place])   History of recent fall   Chronic venous stasis dermatitis   Cellulitis of toe, left great   Acute blood loss anemia   Foot ulcer, left (HCC)   Shortness of breath   Adib Wahba, [place] [facility] Pager [phone]. If 7PM-7AM, please contact night-coverage at [email or web address], password  [facility] 10/12/2015, 6:37 PM  LOS: 3 days

## 2015-10-12 NOTE — Progress Notes (Signed)
Changed the dressing to the left great toe.

## 2015-10-12 NOTE — Progress Notes (Signed)
VASCULAR LAB PRELIMINARY  ARTERIAL  ABI completed: Within normal limits.    RIGHT    LEFT    PRESSURE WAVEFORM  PRESSURE WAVEFORM  BRACHIAL 110 Tri BRACHIAL 104 Tri  DP   DP    AT 122 Tri AT 118 Tri  PT 119 Tri PT 111 Tri  PER   PER    GREAT TOE  NA GREAT TOE  NA    RIGHT LEFT  ABI 1.11 1.07    Landry Mellow, RDMS, RVT  10/12/2015, 9:57 AM

## 2015-10-12 NOTE — Progress Notes (Signed)
Progress Note   Subjective  Patient reports feeling significantly improved this AM. She had AF with RVR yesterday evening and she did not tolerate it well. Bowel prep cancelled last night due to this issue. She has been on clear liquid diet. Passing stool without blood   Objective   Vital signs in last 24 hours: Temp:  [98.1 F (36.7 C)-98.7 F (37.1 C)] 98.1 F (36.7 C) (04/12 0848) Pulse Rate:  [61-109] 93 (04/12 0848) Resp:  [18-20] 20 (04/12 0848) BP: (111-136)/(68-108) 111/73 mmHg (04/12 0848) SpO2:  [94 %-97 %] 95 % (04/12 0848) Weight:  [172 lb 6.4 oz (78.2 kg)] 172 lb 6.4 oz (78.2 kg) (04/11 1958) Last BM Date: 10/11/15 General:    white female in NAD Heart:  Irregularly irregular Lungs: Respirations even and unlabored, lungs CTA bilaterally anteriorly Abdomen:  Soft, nontender and nondistended. Normal bowel sounds. Extremities:  Bilateral lower legs bandaged, no significant edema appreciated. Neurologic:  Alert and oriented,  grossly normal neurologically. Psych:  Cooperative. Normal mood and affect.  Intake/Output from previous day: 04/11 0701 - 04/12 0700 In: 719.5 [P.O.:480; I.V.:239.5] Out: 2700 [Urine:2700] Intake/Output this shift: Total I/O In: 240 [P.O.:240] Out: 400 [Urine:400]  Lab Results:  Recent Labs  10/09/15 1745 10/10/15 0607 10/11/15 0440  WBC 6.0 7.7 7.8  HGB 9.7* 10.3* 10.0*  HCT 30.5* 32.0* 30.3*  PLT 198 231 229   BMET  Recent Labs  10/10/15 0607  NA 139  K 4.1  CL 105  CO2 25  GLUCOSE 127*  BUN 13  CREATININE 0.83  CALCIUM 8.1*   LFT  Recent Labs  10/10/15 0607  PROT 5.0*  ALBUMIN 2.3*  AST 56*  ALT 40  ALKPHOS 65  BILITOT 0.6   PT/INR  Recent Labs  10/10/15 0607 10/11/15 0440  LABPROT 22.2* 18.7*  INR 1.96* 1.55*    Studies/Results: Mr Foot Left Wo Contrast  10/10/2015  CLINICAL DATA:  Diabetic with chronic venous stasis, ulcer and cellulitis of the great toe. No reported drainage. EXAM: MRI  OF THE LEFT FOREFOOT WITHOUT CONTRAST TECHNIQUE: Multiplanar, multisequence MR imaging was performed. No intravenous contrast was administered. COMPARISON:  Radiographs 10/05/2015. FINDINGS: As demonstrated on prior radiographs, there is soft tissue ulceration over the tip of the great toe. No underlying focal fluid collection, foreign body or soft tissue emphysema is seen. There is mild generalized subcutaneous edema throughout the dorsum of the left foot. There is low-level T2 hyperintensity within the distal first phalanx. No cortical erosion or other abnormality on T1 weighted images demonstrated. There is also nonspecific T2 hyperintensity within the middle phalanx of the small toe. Again, there is no apparent cortical destruction in this area on T1 weighted images. The second, third and fourth rays appear unremarkable. The visualized Lisfranc joint appears unremarkable. IMPRESSION: 1. Nonspecific low level T2 hyperintensity within the distal first phalanx adjacent to overlying skin ulceration. There is no abnormal T1 signal to confirm osteomyelitis, although given the proximity to adjacent soft tissue ulceration, marrow infection cannot be excluded. 2. Nonspecific T2 hyperintensity within the middle phalanx of the small toe. The small toe was not imaged on prior radiographs. 3. Mild nonspecific subcutaneous edema within the forefoot without focal fluid collection to suggest abscess. Electronically Signed   By: Richardean Sale M.D.   On: 10/10/2015 13:51   Dg Chest Port 1 View  10/11/2015  CLINICAL DATA:  Shortness of breath, weakness EXAM: PORTABLE CHEST 1 VIEW COMPARISON:  10/07/2012 FINDINGS: Small  bilateral pleural effusions. Bilateral interstitial thickening. No focal consolidation. No pneumothorax. Stable cardiomediastinal silhouette. Mild degenerative changes of bilateral AC joints. IMPRESSION: Findings concerning for mild CHF. Electronically Signed   By: Kathreen Devoid   On: 10/11/2015 12:42        Assessment / Plan:   82 y/o female with history of CAD s/p DES in 2013, AF/flutter on Pradaxa who presented with rectal bleeding and anemia, with low iron but normal ferritin. She has not had a prior colonoscopy. We had planned for a colonoscopy today however she went into AF with RVR yesterday evening and her bowel prep was cancelled. She appears to be feeling better today and HR better controlled. We will tentatively plan for bowel prep this evening / tomorrow AM with plans for colonoscopy tomorrow if her heart rate remains controlled today. I have discussed the risks / benefits of colonoscopy with the patient and she wishes to proceed. Pradaxa is now out of her system, she has not had any further bleeding. Please contact us if, pending her course today, she cannot tolerate prep or is not stable from cardiac standpoint.  Swisher Cellar, MD [facility] Pager [phone]      LOS: 3 days   Shannon [name]  10/12/2015, 9:01 AM

## 2015-10-13 ENCOUNTER — Inpatient Hospital Stay (HOSPITAL_COMMUNITY): Payer: Commercial Managed Care - HMO | Admitting: Certified Registered Nurse Anesthetist

## 2015-10-13 ENCOUNTER — Encounter (HOSPITAL_COMMUNITY): Payer: Self-pay | Admitting: *Deleted

## 2015-10-13 ENCOUNTER — Encounter (HOSPITAL_COMMUNITY)
Admission: EM | Disposition: A | Discharge status: Home Health | Payer: Self-pay | Source: Home / Self Care | Attending: Internal Medicine

## 2015-10-13 DIAGNOSIS — Z9181 History of falling: Secondary | ICD-10-CM

## 2015-10-13 DIAGNOSIS — D123 Benign neoplasm of transverse colon: Secondary | ICD-10-CM

## 2015-10-13 DIAGNOSIS — L03032 Cellulitis of left toe: Secondary | ICD-10-CM

## 2015-10-13 DIAGNOSIS — D122 Benign neoplasm of ascending colon: Secondary | ICD-10-CM

## 2015-10-13 HISTORY — PX: COLONOSCOPY: SHX5424

## 2015-10-13 LAB — BASIC METABOLIC PANEL
Anion gap: 11 (ref 5–15)
BUN: 6 mg/dL (ref 6–20)
CO2: 28 mmol/L (ref 22–32)
Calcium: 8.3 mg/dL — ABNORMAL LOW (ref 8.9–10.3)
Chloride: 103 mmol/L (ref 101–111)
Creatinine, Ser: 0.61 mg/dL (ref 0.44–1.00)
GFR calc Af Amer: >60 mL/min (ref >60)
GLUCOSE: 91 mg/dL (ref 65–99)
POTASSIUM: 3.8 mmol/L (ref 3.5–5.1)
Sodium: 142 mmol/L (ref 135–145)

## 2015-10-13 LAB — GLUCOSE, CAPILLARY
GLUCOSE-CAPILLARY: 84 mg/dL (ref 65–99)
GLUCOSE-CAPILLARY: 106 mg/dL — AB (ref 65–99)
Glucose-Capillary: 110 mg/dL — ABNORMAL HIGH (ref 65–99)
Glucose-Capillary: 74 mg/dL (ref 65–99)
Glucose-Capillary: 85 mg/dL (ref 65–99)
Glucose-Capillary: 92 mg/dL (ref 65–99)

## 2015-10-13 SURGERY — COLONOSCOPY
Anesthesia: Monitor Anesthesia Care

## 2015-10-13 MED ORDER — PHENYLEPHRINE HCL 10 MG/ML IJ SOLN
INTRAMUSCULAR | Status: DC | PRN
  Administered 2015-10-13: 80 ug via INTRAVENOUS

## 2015-10-13 MED ORDER — LACTATED RINGERS IV SOLN
INTRAVENOUS | Status: DC
  Administered 2015-10-13: 1000 mL via INTRAVENOUS
  Administered 2015-10-13: 14:00:00 via INTRAVENOUS

## 2015-10-13 MED ORDER — PROPOFOL 500 MG/50ML IV EMUL
INTRAVENOUS | Status: DC | PRN
  Administered 2015-10-13: 75 ug/kg/min via INTRAVENOUS

## 2015-10-13 NOTE — Anesthesia Preprocedure Evaluation (Addendum)
Anesthesia Evaluation  Patient identified by MRN, date of birth, ID band Patient awake    Reviewed: Allergy & Precautions, NPO status , Patient's Chart, lab work & pertinent test results  Airway Mallampati: II  TM Distance: >3 FB Neck ROM: Full    Dental  (+) Teeth Intact, Dental Advisory Given   Pulmonary former smoker,    breath sounds clear to auscultation       Cardiovascular  Rhythm:Irregular Rate:Normal     Neuro/Psych    GI/Hepatic   Endo/Other  diabetes  Renal/GU      Musculoskeletal   Abdominal   Peds  Hematology   Anesthesia Other Findings   Reproductive/Obstetrics                            Anesthesia Physical Anesthesia Plan  ASA: III  Anesthesia Plan: MAC   Post-op Pain Management:    Induction: Intravenous  Airway Management Planned: Natural Airway and Nasal Cannula  Additional Equipment:   Intra-op Plan:   Post-operative Plan:   Informed Consent: I have reviewed the patients History and Physical, chart, labs and discussed the procedure including the risks, benefits and alternatives for the proposed anesthesia with the patient or authorized representative who has indicated his/her understanding and acceptance.   Dental advisory given  Plan Discussed with: CRNA and Anesthesiologist  Anesthesia Plan Comments:         Anesthesia Quick Evaluation

## 2015-10-13 NOTE — Anesthesia Postprocedure Evaluation (Signed)
Anesthesia Post Note  Patient: Shannon [name]  Procedure(s) Performed: Procedure(s) (LRB): COLONOSCOPY (N/A)  Patient location during evaluation: Endoscopy Anesthesia Type: MAC Level of consciousness: awake, awake and alert and oriented Pain management: pain level controlled Vital Signs Assessment: post-procedure vital signs reviewed and stable Respiratory status: spontaneous breathing, nonlabored ventilation and respiratory function stable Anesthetic complications: no    Last Vitals:  Filed Vitals:   10/13/15 1450 10/13/15 1635  BP: 111/78 119/84  Pulse: 93 97  Temp:  36.4 C  Resp: 16 20    Last Pain:  Filed Vitals:   10/13/15 1636  PainSc: 1                  Anaia Frith COKER

## 2015-10-13 NOTE — Transfer of Care (Signed)
Immediate Anesthesia Transfer of Care Note  Patient: Shannon [name]  Procedure(s) Performed: Procedure(s): COLONOSCOPY (N/A)  Patient Location: Endoscopy Unit  Anesthesia Type:MAC  Level of Consciousness: awake, alert  and oriented  Airway & Oxygen Therapy: Patient Spontanous Breathing and Patient connected to nasal cannula oxygen  Post-op Assessment: Report given to RN and Post -op Vital signs reviewed and stable  Post vital signs: Reviewed and stable  Last Vitals:  Filed Vitals:   10/13/15 0751 10/13/15 1302  BP: 115/80 117/75  Pulse: 95 98  Temp: 36.8 C 36.4 C  Resp: 18 12    Complications: No apparent anesthesia complications

## 2015-10-13 NOTE — Progress Notes (Signed)
[facility] PROGRESS NOTE  Shannon [name] [id number] DOB: February 11, 1932 DOA: 10/09/2015 PCP: Haywood Pao, MD  HPI/Brief narrative 82 year old female with history of A. fib on Pradaxa, coronary artery disease, type 2 diabetes mellitus, left great toe ulcer (recently saw Dr. Con Memos at the wound care center) presented with acute lower GI bleed.  Assessment/Plan: Acute lower GI bleed/blood loss anemia Hgb has remained stable thus far. Holding Pradaxa. [facility] following and patient underwent colonoscopy on 4/13 with findings of 52mm polyp in ascending colon that was removed, 47mm polyp in transverse colon that was removed. GI recommends resuming Pradaxa in 3 days post procedure, daily fiber supplement, trial of Anusol  Chronic A. fib with RVR Patient appears quite deconditioned with rapid A. fib on minimal exertion. EKG done showed junctional rhythm. Continued increased beta blocker with improvement. Continue amiodarone as tolerated  Shortness of breath likely secondary to acutely decompensated diastolic CHF, chronicity unknown Likely due to deconditioning and mild volume overload on chest x-ray. 2-D echo from 04/2015 shows EF of 50-55% with no wall motion abnormality. Patient has improved with lasix.  Diabetes mellitus type 2 CBG stable. Continue sliding scale coverage.  Coronary artery disease Stable. Continue beta blocker.  Left great toe ulcer with history of chronic venous stasis MRI of the left foot done negative for osteomyelitis, cannot rule out marrow infection. Was started on empiric clindamycin. Given risk for C. difficile, transitioned to oral doxycycline. Seen by wound care and applied dressing with Ardelia Mems boot Patient recently saw Dr. Con Memos at the wound care clinic. ABI unremarkable. Will treat her with antibiotics and have her follow-up at the wound care clinic.  Code Status: Full Family Communication: Pt in room Disposition Plan: Uncertain at this  time   Consultants:  GI  Procedures:    Antibiotics: Anti-infectives    Start     Dose/Rate Route Frequency Ordered Stop   10/11/15 1515  doxycycline (VIBRA-TABS) tablet 100 mg     100 mg Oral Every 12 hours 10/11/15 1509     10/09/15 1000  clindamycin (CLEOCIN) IVPB 300 mg  Status:  Discontinued     300 mg 100 mL/hr over 30 Minutes Intravenous Every 12 hours 10/09/15 0752 10/11/15 1509      HPI/Subjective: No complaints today  Objective: Filed Vitals:   10/13/15 1430 10/13/15 1440 10/13/15 1450 10/13/15 1635  BP: 88/59 100/66 111/78 119/84  Pulse: 93 95 93 97  Temp:    97.6 F (36.4 C)  TempSrc:    Oral  Resp: 13 17 16 20   Height:      Weight:      SpO2: 100% 96% 100% 100%    Intake/Output Summary (Last 24 hours) at 10/13/15 1823 Last data filed at 10/13/15 1631  Gross per 24 hour  Intake   1245 ml  Output    900 ml  Net    345 ml   Filed Weights   10/10/15 2002 10/11/15 1958 10/12/15 1931  Weight: 78.4 kg (172 lb 13.5 oz) 78.2 kg (172 lb 6.4 oz) 78.3 kg (172 lb 9.9 oz)    Exam:   General:  Awake, in nad, laying in bed  Cardiovascular: regular, s1, s2  Respiratory: normal resp effort, no wheezing  Abdomen: soft, nondistended, pos BS  Musculoskeletal: perfused, no clubbing   Data Reviewed: Basic Metabolic Panel:  Recent Labs Lab 10/09/15 0425 10/10/15 0607 10/12/15 0817 10/13/15 0524  NA 136 139 141 142  K 3.5 4.1 3.8 3.8  CL 101  105 103 103  CO2 24 25 28 28   GLUCOSE 165* 127* 115* 91  BUN 18 13 7 6   CREATININE 1.11* 0.83 0.78 0.61  CALCIUM 8.5* 8.1* 8.3* 8.3*   Liver Function Tests:  Recent Labs Lab 10/09/15 0425 10/10/15 0607  AST 45* 56*  ALT 33 40  ALKPHOS 76 65  BILITOT 0.9 0.6  PROT 5.6* 5.0*  ALBUMIN 2.6* 2.3*   No results for input(s): LIPASE, AMYLASE in the last 168 hours. No results for input(s): AMMONIA in the last 168 hours. CBC:  Recent Labs Lab 10/09/15 0843 10/09/15 1745 10/10/15 0607 10/11/15 0440  10/12/15 0817  WBC 8.8 6.0 7.7 7.8 8.2  NEUTROABS 7.0  --   --   --   --   HGB 10.5* 9.7* 10.3* 10.0* 10.4*  HCT 33.2* 30.5* 32.0* 30.3* 31.5*  MCV 101.5* 102.0* 102.2* 103.4* 102.6*  PLT 230 198 231 229 202   Cardiac Enzymes: No results for input(s): CKTOTAL, CKMB, CKMBINDEX, TROPONINI in the last 168 hours. BNP (last 3 results) No results for input(s): BNP in the last 8760 hours.  ProBNP (last 3 results) No results for input(s): PROBNP in the last 8760 hours.  CBG:  Recent Labs Lab 10/13/15 0009 10/13/15 0354 10/13/15 0750 10/13/15 1136 10/13/15 1536  GLUCAP 110* 92 85 84 74    No results found for this or any previous visit (from the past 240 hour(s)).   Studies: No results found.  Scheduled Meds: . amiodarone  200 mg Oral Daily  . doxycycline  100 mg Oral Q12H  . furosemide  20 mg Intravenous Daily  . insulin aspart  0-9 Units Subcutaneous 6 times per day  . metoprolol succinate  50 mg Oral BID  . sodium chloride flush  3 mL Intravenous Q12H   Continuous Infusions: . sodium chloride 10 mL/hr at 10/10/15 0711  . lactated ringers 1,000 mL (10/13/15 1308)    Principal Problem:   Acute GI bleeding Active Problems:   CAD (coronary artery disease)   Hyperlipidemia   Diabetes mellitus ([place])   Atrial fibrillation ([place])   History of recent fall   Chronic venous stasis dermatitis   Cellulitis of toe, left great   Acute blood loss anemia   Foot ulcer, left (HCC)   Shortness of breath   Sincere Berlanga, [facility] Pager [phone]. If 7PM-7AM, please contact night-coverage at [email or web address], password [facility] 10/13/2015, 6:23 PM  LOS: 4 days

## 2015-10-13 NOTE — Op Note (Signed)
[address] [facility] Patient Name: Shannon [name] Procedure Date : 10/13/2015 MRN: [id number] Attending MD: Carlota Raspberry. Armbruster MD, MD Date of Birth: [birth year] CSN: [id number] Age: 82 Admit Type: Inpatient Procedure:                Colonoscopy Indications:              Hematochezia, anemia (macrocytic), on Pradaxa Providers:                Carlota Raspberry. Armbruster MD, MD, Malka So, RN,                            Cletis Athens, Technician Referring MD:              Medicines:                Monitored Anesthesia Care Complications:            No immediate complications. Estimated blood loss:                            Minimal. Estimated Blood Loss:     Estimated blood loss was minimal. Procedure:                Pre-Anesthesia Assessment:                           - Prior to the procedure, a History and Physical                            was performed, and patient medications and                            allergies were reviewed. The patient's tolerance of                            previous anesthesia was also reviewed. The risks                            and benefits of the procedure and the sedation                            options and risks were discussed with the patient.                            All questions were answered, and informed consent                            was obtained. Prior Anticoagulants: The patient                            last took Pradaxa (dabigatran) 5 days prior to the                            procedure. ASA Grade Assessment: III - A patient  with severe systemic disease. After reviewing the                            risks and benefits, the patient was deemed in                            satisfactory condition to undergo the procedure.                           After obtaining informed consent, the colonoscope                            was passed under direct vision. Throughout the    procedure, the patient's blood pressure, pulse, and                            oxygen saturations were monitored continuously. The                            EC-[id number] scope was introduced through                            the anus and advanced to the the terminal ileum,                            with identification of the appendiceal orifice and                            IC valve. The colonoscopy was performed without                            difficulty. The patient tolerated the procedure                            well. The quality of the bowel preparation was                            good. The terminal ileum, ileocecal valve,                            appendiceal orifice, and rectum were photographed. Scope In: 2:02:41 PM Scope Out: 2:20:53 PM Total Procedure Duration: 0 hours 18 minutes 12 seconds  Findings:      The perianal and digital rectal examinations were normal.      A 3 mm polyp was found in the ascending colon. The polyp was sessile.       The polyp was removed with a cold biopsy forceps. Resection and       retrieval were complete.      A 5 mm polyp was found in the transverse colon. The polyp was sessile.       The polyp was removed with a cold biopsy forceps. Resection and       retrieval were complete.      A few small-mouthed diverticula were found in the sigmoid colon.      The terminal ileum  appeared normal.      Non-bleeding internal hemorrhoids were found during retroflexion.      The exam was otherwise normal throughout the examined colon. Impression:               - One 3 mm polyp in the ascending colon, removed                            with a cold biopsy forceps. Resected and retrieved.                           - One 5 mm polyp in the transverse colon, removed                            with a cold biopsy forceps. Resected and retrieved.                           - Diverticulosis in the sigmoid colon.                           - The  examined portion of the ileum was normal.                           - Non-bleeding internal hemorrhoids.                           Overall, given the patient's symptoms (bright red                            rectal bleeding), I suspect she had hemorrhoidal                            bleeding in the setting of Pradaxa, as no other                            pathology was appreciated to cause her symptoms.                            Anemia is macrocytic with normal ferritin. I                            suspect anemia is multifactorial and other                            etiologies need to be pursued. No active GI                            bleeding at this time, and Hgb is stable Moderate Sedation:      no moderate sedation Recommendation:           - Return patient to hospital ward for ongoing care.                           - Resume previous diet.                           -  Continue present medications.                           - Daily fiber supplement for hemorrhoid treatment,                            trial of Anusol PR q day                           - Recommend additional evaluation for anemia as you                            deem appropriate, as I suspect this is                            multifactorial. Suspect she had some mild                            hemorrhoid bleeding but don't think this is driving                            her entire anemia.                           - Defer resumption of Pradaxa to primary service,                            would resume in 3 days or so if she does resume it                            given polypectomy today, although she is at risk                            for rebleeding on the medication. If she does have                            recurrence of hemorrhoidal bleeding, she can see me                            in clinic and can consider banding procedure.                            Pradaxa would need to be held for any banding  in                            the future.                           - Cleared for discharge from GI perspective given                            no active bleeding                           -  Await pathology results                           - Do not feel a repeat colonoscopy is indicated for                            surveillance given age                           - No repeat colonoscopy due to age. Procedure Code(s):        --- Professional ---                           [phone], Colonoscopy, flexible; with biopsy, single                            or multiple Diagnosis Code(s):        --- Professional ---                           D12.2, Benign neoplasm of ascending colon                           D12.3, Benign neoplasm of transverse colon (hepatic                            flexure or splenic flexure)                           K64.8, Other hemorrhoids                           K92.1, Melena (includes Hematochezia)                           K57.30, Diverticulosis of large intestine without                            perforation or abscess without bleeding CPT copyright 2016 [employer]. All rights reserved. The codes documented in this report are preliminary and upon coder review may  be revised to meet current compliance requirements. Remo Lipps P. Armbruster MD, MD 10/13/2015 2:36:21 PM This report has been signed electronically. Number of Addenda: 0

## 2015-10-13 NOTE — Care Management Important Message (Signed)
Important Message  Patient Details  Name: Shannon [name] MRN: [id number] Date of Birth: 25-Feb-1932   Medicare Important Message Given:  Yes    Jenaro Souder P Helios Kohlmann 10/13/2015, 1:02 PM

## 2015-10-13 NOTE — Progress Notes (Signed)
Physical Therapy Treatment Patient Details Name: Shannon [name] MRN: [id number] DOB: 16-Jan-1932 Today's Date: 10/13/2015    History of Present Illness Shannon [name] is a 82 y.o. female with a past medical history significant for CAD, atrial fibrillation on anticoagulation of Pradaxa, MVP, OSA, DM type 2, possible PUD, and HLD; who presented with complaints of rectal bleeding since this morning.     PT Comments    Making progress, balance stable with RW for support. More fatigued today. HR did briefly reach 145 however improved quickly upon sitting back to 90s; HR avg in 120s while ambulating. SpO2 94% on room air during bout. Patient will continue to benefit from skilled physical therapy services to further improve independence with functional mobility.   Follow Up Recommendations  Home health PT;Supervision - Intermittent     Equipment Recommendations  Other (comment) (rollator with 4 wheels and seat)    Recommendations for Other Services OT consult     Precautions / Restrictions Precautions Precautions: Fall Precaution Comments: pt reports 2 falls recently where legs gave way, first time she was unable to get up and RN from [facility] helped her, second time she was able to get herself up Restrictions Weight Bearing Restrictions: No    Mobility  Bed Mobility Overal bed mobility: Modified Independent             General bed mobility comments: pt able to get to EOB with increased time and effort  Transfers Overall transfer level: Needs assistance Equipment used: Rolling walker (2 wheeled) Transfers: Sit to/from Stand Sit to Stand: Min guard         General transfer comment: min guard for safety. VC for hand placement. Good stability with use of RW for support. States she feels a bit weak.  Ambulation/Gait Ambulation/Gait assistance: Min guard Ambulation Distance (Feet): 110 Feet Assistive device: Rolling walker (2 wheeled) Gait Pattern/deviations:  Step-through pattern;Decreased stride length;Trunk flexed Gait velocity: decreased Gait velocity interpretation: Below normal speed for age/gender General Gait Details: Decreased speed and stride length. Required only 1 standing rest break to complete distance. Quite fatigued by end of bout. No overt loss of balance or buckling noted.  HR briefly reached 145 on monitor, however avg in 120s during bout and returned to 90s after sitting. Denies chest pain or palpitations. SpO2 94% on room air during bout.   Stairs            Wheelchair Mobility    Modified Rankin (Stroke Patients Only)       Balance                                    Cognition Arousal/Alertness: Awake/alert Behavior During Therapy: WFL for tasks assessed/performed Overall Cognitive Status: Within Functional Limits for tasks assessed                      Exercises General Exercises - Lower Extremity Ankle Circles/Pumps: AROM;Both;10 reps;Supine Quad Sets: Strengthening;Both;10 reps;Seated Long Arc Quad: Strengthening;Both;10 reps;Seated Hip Flexion/Marching: Strengthening;Both;10 reps;Seated    General Comments        Pertinent Vitals/Pain Pain Assessment: No/denies pain    Home Living                      Prior Function            PT Goals (current goals can now be found in the care  plan section) Acute Rehab PT Goals Patient Stated Goal: have more energy PT Goal Formulation: With patient Time For Goal Achievement: 10/24/15 Potential to Achieve Goals: Good Progress towards PT goals: Progressing toward goals    Frequency  Min 3X/week    PT Plan Current plan remains appropriate    Co-evaluation             End of Session Equipment Utilized During Treatment: Gait belt Activity Tolerance: Patient tolerated treatment well Patient left: with call bell/phone within reach;in bed;with bed alarm set;with SCD's reapplied     Time: 1114-1130 PT Time  Calculation (min) (ACUTE ONLY): 16 min  Charges:  $Gait Training: 8-22 mins                    G Codes:      Ellouise Newer 25-Oct-2015, 12:39 PM  Camille Bal [address], [place]

## 2015-10-13 NOTE — H&P (View-Only) (Signed)
Progress Note   Subjective  Patient reports feeling significantly improved this AM. She had AF with RVR yesterday evening and she did not tolerate it well. Bowel prep cancelled last night due to this issue. She has been on clear liquid diet. Passing stool without blood   Objective   Vital signs in last 24 hours: Temp:  [98.1 F (36.7 C)-98.7 F (37.1 C)] 98.1 F (36.7 C) (04/12 0848) Pulse Rate:  [61-109] 93 (04/12 0848) Resp:  [18-20] 20 (04/12 0848) BP: (111-136)/(68-108) 111/73 mmHg (04/12 0848) SpO2:  [94 %-97 %] 95 % (04/12 0848) Weight:  [172 lb 6.4 oz (78.2 kg)] 172 lb 6.4 oz (78.2 kg) (04/11 1958) Last BM Date: 10/11/15 General:    white female in NAD Heart:  Irregularly irregular Lungs: Respirations even and unlabored, lungs CTA bilaterally anteriorly Abdomen:  Soft, nontender and nondistended. Normal bowel sounds. Extremities:  Bilateral lower legs bandaged, no significant edema appreciated. Neurologic:  Alert and oriented,  grossly normal neurologically. Psych:  Cooperative. Normal mood and affect.  Intake/Output from previous day: 04/11 0701 - 04/12 0700 In: 719.5 [P.O.:480; I.V.:239.5] Out: 2700 [Urine:2700] Intake/Output this shift: Total I/O In: 240 [P.O.:240] Out: 400 [Urine:400]  Lab Results:  Recent Labs  10/09/15 1745 10/10/15 0607 10/11/15 0440  WBC 6.0 7.7 7.8  HGB 9.7* 10.3* 10.0*  HCT 30.5* 32.0* 30.3*  PLT 198 231 229   BMET  Recent Labs  10/10/15 0607  NA 139  K 4.1  CL 105  CO2 25  GLUCOSE 127*  BUN 13  CREATININE 0.83  CALCIUM 8.1*   LFT  Recent Labs  10/10/15 0607  PROT 5.0*  ALBUMIN 2.3*  AST 56*  ALT 40  ALKPHOS 65  BILITOT 0.6   PT/INR  Recent Labs  10/10/15 0607 10/11/15 0440  LABPROT 22.2* 18.7*  INR 1.96* 1.55*    Studies/Results: Mr Foot Left Wo Contrast  10/10/2015  CLINICAL DATA:  Diabetic with chronic venous stasis, ulcer and cellulitis of the great toe. No reported drainage. EXAM: MRI  OF THE LEFT FOREFOOT WITHOUT CONTRAST TECHNIQUE: Multiplanar, multisequence MR imaging was performed. No intravenous contrast was administered. COMPARISON:  Radiographs 10/05/2015. FINDINGS: As demonstrated on prior radiographs, there is soft tissue ulceration over the tip of the great toe. No underlying focal fluid collection, foreign body or soft tissue emphysema is seen. There is mild generalized subcutaneous edema throughout the dorsum of the left foot. There is low-level T2 hyperintensity within the distal first phalanx. No cortical erosion or other abnormality on T1 weighted images demonstrated. There is also nonspecific T2 hyperintensity within the middle phalanx of the small toe. Again, there is no apparent cortical destruction in this area on T1 weighted images. The second, third and fourth rays appear unremarkable. The visualized Lisfranc joint appears unremarkable. IMPRESSION: 1. Nonspecific low level T2 hyperintensity within the distal first phalanx adjacent to overlying skin ulceration. There is no abnormal T1 signal to confirm osteomyelitis, although given the proximity to adjacent soft tissue ulceration, marrow infection cannot be excluded. 2. Nonspecific T2 hyperintensity within the middle phalanx of the small toe. The small toe was not imaged on prior radiographs. 3. Mild nonspecific subcutaneous edema within the forefoot without focal fluid collection to suggest abscess. Electronically Signed   By: Richardean Sale M.D.   On: 10/10/2015 13:51   Dg Chest Port 1 View  10/11/2015  CLINICAL DATA:  Shortness of breath, weakness EXAM: PORTABLE CHEST 1 VIEW COMPARISON:  10/07/2012 FINDINGS: Small  bilateral pleural effusions. Bilateral interstitial thickening. No focal consolidation. No pneumothorax. Stable cardiomediastinal silhouette. Mild degenerative changes of bilateral AC joints. IMPRESSION: Findings concerning for mild CHF. Electronically Signed   By: Kathreen Devoid   On: 10/11/2015 12:42        Assessment / Plan:   82 y/o female with history of CAD s/p DES in 2013, AF/flutter on Pradaxa who presented with rectal bleeding and anemia, with low iron but normal ferritin. She has not had a prior colonoscopy. We had planned for a colonoscopy today however she went into AF with RVR yesterday evening and her bowel prep was cancelled. She appears to be feeling better today and HR better controlled. We will tentatively plan for bowel prep this evening / tomorrow AM with plans for colonoscopy tomorrow if her heart rate remains controlled today. I have discussed the risks / benefits of colonoscopy with the patient and she wishes to proceed. Pradaxa is now out of her system, she has not had any further bleeding. Please contact us if, pending her course today, she cannot tolerate prep or is not stable from cardiac standpoint.  [name], MD [facility] Pager [phone]      LOS: 3 days   Shannon [name]  10/12/2015, 9:01 AM

## 2015-10-13 NOTE — Interval H&P Note (Signed)
History and Physical Interval Note:  10/13/2015 1:40 PM  [name]  has presented today for surgery, with the diagnosis of anemia  The various methods of treatment have been discussed with the patient and family. After consideration of risks, benefits and other options for treatment, the patient has consented to  Procedure(s): COLONOSCOPY (N/A) as a surgical intervention .  The patient's history has been reviewed, patient examined, no change in status, stable for surgery.  I have reviewed the patient's chart and labs.  Questions were answered to the patient's satisfaction.     Renelda Loma Rmani Kapusta

## 2015-10-14 ENCOUNTER — Encounter: Payer: Self-pay | Admitting: Gastroenterology

## 2015-10-14 DIAGNOSIS — R0602 Shortness of breath: Secondary | ICD-10-CM

## 2015-10-14 LAB — GLUCOSE, CAPILLARY
GLUCOSE-CAPILLARY: 102 mg/dL — AB (ref 65–99)
Glucose-Capillary: 84 mg/dL (ref 65–99)
Glucose-Capillary: 82 mg/dL (ref 65–99)
Glucose-Capillary: 142 mg/dL — ABNORMAL HIGH (ref 65–99)

## 2015-10-14 MED ORDER — HYDROCORTISONE 1 % RE CREA: 1.0000 "application " | TOPICAL_CREAM | Freq: Every day | RECTAL | Status: DC

## 2015-10-14 MED ORDER — DOXYCYCLINE HYCLATE 100 MG PO TABS: 100.0000 mg | ORAL_TABLET | Freq: Two times a day (BID) | ORAL | Status: DC

## 2015-10-14 MED ORDER — SACCHAROMYCES BOULARDII 250 MG PO CAPS: 250.0000 mg | ORAL_CAPSULE | Freq: Two times a day (BID) | ORAL | Status: DC

## 2015-10-14 NOTE — Discharge Summary (Signed)
Physician Discharge Summary  Shannon [name] [id number] DOB: Jul 03, 1931 DOA: 10/09/2015  PCP: Haywood Pao, MD  Admit date: 10/09/2015 Discharge date: 10/14/2015  Time spent: 20 minutes  Recommendations for Outpatient Follow-up:  1. Follow up with PCP in 2-3 weeks 2. Would repeat CBC in 2-3 weeks   Discharge Diagnoses:  Principal Problem:   Acute GI bleeding Active Problems:   CAD (coronary artery disease)   Hyperlipidemia   Diabetes mellitus (HCC)   Atrial fibrillation ([place])   History of recent fall   Chronic venous stasis dermatitis   Cellulitis of toe, left great   Acute blood loss anemia   Foot ulcer, left (HCC)   Shortness of breath   Discharge Condition: Stable  Diet recommendation: Diabetic, heart healthy  Filed Weights   10/10/15 2002 10/11/15 1958 10/12/15 1931  Weight: 78.4 kg (172 lb 13.5 oz) 78.2 kg (172 lb 6.4 oz) 78.3 kg (172 lb 9.9 oz)    History of present illness:  Please review dictated H and P from 4/9 for details. Briefly, 82 year old female with history of A. fib on Pradaxa, coronary artery disease, type 2 diabetes mellitus, left great toe ulcer (recently saw Dr. Con Memos at the wound care center) presented with acute lower GI bleed.  Hospital Course:  Acute lower GI bleed/blood loss anemia Hgb has remained stable thus far. Holding Pradaxa. [facility] following and patient underwent colonoscopy on 4/13 with findings of 16mm polyp in ascending colon that was removed, 56mm polyp in transverse colon that was removed. GI recommends resuming Pradaxa in 3 days post procedure (restart on 4/16), daily fiber supplement, trial of Anusol  Chronic A. fib with RVR Patient appears quite deconditioned with rapid A. fib on minimal exertion. EKG done showed junctional rhythm. Continued increased beta blocker with improvement. Continued amiodarone as tolerated  Shortness of breath likely secondary to acutely decompensated diastolic CHF, chronicity unknown Likely  due to deconditioning and mild volume overload on chest x-ray. 2-D echo from 04/2015 shows EF of 50-55% with no wall motion abnormality. Patient has improved with IV lasix.  Diabetes mellitus type 2 CBG stable. Continue sliding scale coverage.  Coronary artery disease Stable. Continue beta blocker.  Left great toe ulcer with history of chronic venous stasis MRI of the left foot done negative for osteomyelitis, cannot rule out marrow infection. Was started on empiric clindamycin. Given risk for C. difficile, transitioned to oral doxycycline. Seen by wound care and applied dressing with Ardelia Mems boot Patient recently saw Dr. Con Memos at the wound care clinic. ABI unremarkable. Will treat her with antibiotics and have her follow-up at the wound care clinic.  Procedures:  Colonoscopy 4/13  Consultations:  GI  Discharge Exam: Filed Vitals:   10/13/15 2107 10/14/15 0401 10/14/15 0810 10/14/15 1600  BP: 109/71 97/67 103/62   Pulse: 95 97 96   Temp: 98.4 F (36.9 C) 98 F (36.7 C) 97.9 F (36.6 C)   TempSrc: Oral Oral Oral   Resp: 18 16 16    Height:      Weight:      SpO2: 95% 97% 98% 100%    General: Awake, in nad Cardiovascular: regular, s1, s2 Respiratory: normal resp effort, no wheezing  Discharge Instructions     Medication List    STOP taking these medications        clindamycin 300 MG capsule  Commonly known as:  CLEOCIN      TAKE these medications        amiodarone 200 MG tablet  Commonly known as:  PACERONE  Take 1 tablet (200 mg total) by mouth daily.     atorvastatin 20 MG tablet  Commonly known as:  LIPITOR  TAKE ONE TABLET BY MOUTH ONE TIME DAILY     doxycycline 100 MG tablet  Commonly known as:  VIBRA-TABS  Take 1 tablet (100 mg total) by mouth every 12 (twelve) hours.     furosemide 20 MG tablet  Commonly known as:  LASIX  Take 20 mg by mouth daily.     HYDROcodone-acetaminophen 5-325 MG tablet  Commonly known as:  NORCO/VICODIN  TAKE 1/2 TO 1  TABLET BY MOUTH 2-3 TIMES DAILY AS NEEDED FOR SEVERE PAIN     hydrocortisone 1 % Crea  Commonly known as:  PROCTO-PAK  Apply 1 application topically daily.     KLOR-CON 10 10 MEQ tablet  Generic drug:  potassium chloride  Take 10 mEq by mouth daily.     metFORMIN 500 MG tablet  Commonly known as:  GLUCOPHAGE  Take 500 mg by mouth 2 (two) times daily.     metoprolol succinate 50 MG 24 hr tablet  Commonly known as:  TOPROL-XL  Take 25 mg by mouth 2 (two) times daily. Take with or immediately following a meal.     nitroGLYCERIN 0.4 MG SL tablet  Commonly known as:  NITROSTAT  Place 0.4 mg under the tongue every 5 (five) minutes x 3 doses as needed for chest pain. Up to 3 doses, if pain continues, call 911.     PRADAXA 150 MG Caps capsule  Generic drug:  dabigatran  TAKE ONE CAPSULE BY MOUTH TWICE DAILY     saccharomyces boulardii 250 MG capsule  Commonly known as:  FLORASTOR  Take 1 capsule (250 mg total) by mouth 2 (two) times daily.       Allergies  Allergen Reactions  . Demerol Other (See Comments)    Burning sensation all over body  . Erythromycin Nausea And Vomiting  . Penicillins Other (See Comments)    unknown  . Percocet [Oxycodone-Acetaminophen] Swelling   Follow-up Information    Follow up with Haywood Pao, MD. Schedule an appointment as soon as possible for a visit in 2 weeks.   Specialty:  Internal Medicine   Why:  Hospital follow up   Contact information:   [address] [place] [address] [phone]       Schedule an appointment as soon as possible for a visit with Britto, Elisabeth Cara, MD.   Specialty:  Surgery   Why:  Hospital follow up   Contact information:   [place] [address] [phone]        The results of significant diagnostics from this hospitalization (including imaging, microbiology, ancillary and laboratory) are listed below for reference.    Significant Diagnostic Studies: Mr Foot Left  Wo Contrast  10/10/2015  CLINICAL DATA:  Diabetic with chronic venous stasis, ulcer and cellulitis of the great toe. No reported drainage. EXAM: MRI OF THE LEFT FOREFOOT WITHOUT CONTRAST TECHNIQUE: Multiplanar, multisequence MR imaging was performed. No intravenous contrast was administered. COMPARISON:  Radiographs 10/05/2015. FINDINGS: As demonstrated on prior radiographs, there is soft tissue ulceration over the tip of the great toe. No underlying focal fluid collection, foreign body or soft tissue emphysema is seen. There is mild generalized subcutaneous edema throughout the dorsum of the left foot. There is low-level T2 hyperintensity within the distal first phalanx. No cortical erosion or other  abnormality on T1 weighted images demonstrated. There is also nonspecific T2 hyperintensity within the middle phalanx of the small toe. Again, there is no apparent cortical destruction in this area on T1 weighted images. The second, third and fourth rays appear unremarkable. The visualized Lisfranc joint appears unremarkable. IMPRESSION: 1. Nonspecific low level T2 hyperintensity within the distal first phalanx adjacent to overlying skin ulceration. There is no abnormal T1 signal to confirm osteomyelitis, although given the proximity to adjacent soft tissue ulceration, marrow infection cannot be excluded. 2. Nonspecific T2 hyperintensity within the middle phalanx of the small toe. The small toe was not imaged on prior radiographs. 3. Mild nonspecific subcutaneous edema within the forefoot without focal fluid collection to suggest abscess. Electronically Signed   By: Richardean Sale M.D.   On: 10/10/2015 13:51   Dg Chest Port 1 View  10/11/2015  CLINICAL DATA:  Shortness of breath, weakness EXAM: PORTABLE CHEST 1 VIEW COMPARISON:  10/07/2012 FINDINGS: Small bilateral pleural effusions. Bilateral interstitial thickening. No focal consolidation. No pneumothorax. Stable cardiomediastinal silhouette. Mild degenerative  changes of bilateral AC joints. IMPRESSION: Findings concerning for mild CHF. Electronically Signed   By: Kathreen Devoid   On: 10/11/2015 12:42   Dg Toe Great Left  10/05/2015  CLINICAL DATA:  Diabetic ulcer on bottom of left great toe. Pt just came from wound center and bandage just placed so unable to remove. Osteomyelitis. EXAM: LEFT GREAT TOE COMPARISON:  None. FINDINGS: There is a subtle erosion of the white cortical line along the medial tip of the distal tuft of the great toe underlying the soft tissue ulcer. This is suggestive of osteomyelitis. There is no fracture.  There is no other evidence of osteomyelitis. IMPRESSION: Probable subtle area of osteomyelitis of the distal tuft of the left great toe underlying the great toe soft tissue ulcer. Electronically Signed   By: Lajean Manes M.D.   On: 10/05/2015 16:36    Microbiology: No results found for this or any previous visit (from the past 240 hour(s)).   Labs: Basic Metabolic Panel:  Recent Labs Lab 10/09/15 0425 10/10/15 0607 10/12/15 0817 10/13/15 0524  NA 136 139 141 142  K 3.5 4.1 3.8 3.8  CL 101 105 103 103  CO2 24 25 28 28   GLUCOSE 165* 127* 115* 91  BUN 18 13 7 6   CREATININE 1.11* 0.83 0.78 0.61  CALCIUM 8.5* 8.1* 8.3* 8.3*   Liver Function Tests:  Recent Labs Lab 10/09/15 0425 10/10/15 0607  AST 45* 56*  ALT 33 40  ALKPHOS 76 65  BILITOT 0.9 0.6  PROT 5.6* 5.0*  ALBUMIN 2.6* 2.3*   No results for input(s): LIPASE, AMYLASE in the last 168 hours. No results for input(s): AMMONIA in the last 168 hours. CBC:  Recent Labs Lab 10/09/15 0843 10/09/15 1745 10/10/15 0607 10/11/15 0440 10/12/15 0817  WBC 8.8 6.0 7.7 7.8 8.2  NEUTROABS 7.0  --   --   --   --   HGB 10.5* 9.7* 10.3* 10.0* 10.4*  HCT 33.2* 30.5* 32.0* 30.3* 31.5*  MCV 101.5* 102.0* 102.2* 103.4* 102.6*  PLT 230 198 231 229 202   Cardiac Enzymes: No results for input(s): CKTOTAL, CKMB, CKMBINDEX, TROPONINI in the last 168 hours. BNP: BNP  (last 3 results) No results for input(s): BNP in the last 8760 hours.  ProBNP (last 3 results) No results for input(s): PROBNP in the last 8760 hours.  CBG:  Recent Labs Lab 10/13/15 1930 10/14/15 0013 10/14/15 0402 10/14/15 [id number]  10/14/15 1150  GLUCAP 106* 102* 84 82 142*     Signed:  Lucyann Romano K  [facility] 10/14/2015, 6:56 PM

## 2015-10-14 NOTE — Progress Notes (Signed)
Physical Therapy Treatment Patient Details Name: Shannon [name] MRN: [id number] DOB: 1932/03/08 Today's Date: 10/14/2015    History of Present Illness Shannon [name] is a 82 y.o. female with a past medical history significant for CAD, atrial fibrillation on anticoagulation of Pradaxa, MVP, OSA, DM type 2, possible PUD, and HLD; who presented with complaints of rectal bleeding since this morning.     PT Comments    Pt was unable to walk as far today during gait as she felt her legs were getting too weak.  HR was better controlled 90s-120s.  Pt is due to d/c home to son's house until she is back up on her feet.  PT to follow acutely until d/c confirmed.     Follow Up Recommendations  Home health PT;Supervision - Intermittent     Equipment Recommendations  Other (comment) (rollator- 4 wheels and a seat)    Recommendations for Other Services  NA     Precautions / Restrictions Precautions Precautions: Fall Precaution Comments: pt reports 2 falls recently where legs gave way, first time she was unable to get up and RN from [facility] helped her, second time she was able to get herself up Restrictions Weight Bearing Restrictions: No    Mobility  Bed Mobility Overal bed mobility: Modified Independent             General bed mobility comments: Pt able to get EOB given increased time and use of bed rail for leverage at trunk.   Transfers Overall transfer level: Needs assistance Equipment used: Rolling walker (2 wheeled) Transfers: Sit to/from Stand Sit to Stand: Min assist         General transfer comment: Min assisst to help support trunk to power up to standing over weak legs, especially needed from low toilet in bathroom.  Verbal cues for safe hand placement during transitions.   Ambulation/Gait Ambulation/Gait assistance: Min guard Ambulation Distance (Feet): 60 Feet Assistive device: Rolling walker (2 wheeled) Gait Pattern/deviations: Step-through  pattern;Shuffle Gait velocity: decreased Gait velocity interpretation: Below normal speed for age/gender General Gait Details: Slow, yet steady (with RW) gait.  Pt with HR 90-125 during today's session. Pt did report feeling generally weak and had to turn back to her room as she felt she would not make it back because of leg weakness.           Balance Overall balance assessment: Needs assistance Sitting-balance support: Feet supported;No upper extremity supported Sitting balance-Leahy Scale: Good     Standing balance support: Bilateral upper extremity supported;Single extremity supported;No upper extremity supported Standing balance-Leahy Scale: Fair Standing balance comment: Pt stood at sink preforming ADLs with trunk leaning on sink for support for > 5 mins after walking.                     Cognition Arousal/Alertness: Awake/alert Behavior During Therapy: WFL for tasks assessed/performed Overall Cognitive Status: Within Functional Limits for tasks assessed (not specifically tested)                             Pertinent Vitals/Pain Pain Assessment: No/denies pain           PT Goals (current goals can now be found in the care plan section) Acute Rehab PT Goals Patient Stated Goal: have more energy Progress towards PT goals: Progressing toward goals    Frequency  Min 3X/week    PT Plan Current plan remains appropriate  End of Session   Activity Tolerance: Patient limited by fatigue Patient left: in chair;with call bell/phone within reach (chair alarm pad under pt, unable to find box)     Time: [id number] PT Time Calculation (min) (ACUTE ONLY): 13 min  Charges:  $Gait Training: 8-22 mins                       Paxton Kanaan B. [place], [place], DPT [phone]   10/14/2015, 1:37 PM

## 2015-10-14 NOTE — Care Management Note (Addendum)
Case Management Note  Patient Details  Name: Shannon [name] MRN: [id number] Date of Birth: 1932-06-26  Subjective/Objective:          CM following for progression and d/c planning.          Action/Plan: Pt son [name] address [address] [place], [place]  10/14/2015 Met with pt who states that she plans to d/c to home of son, This CM spoke with son  @ [phone] who states that he will pick pt up about 2pm. Oxygen to be weaned off, flow rate decreased by this CM  And pt RN notified.  Pt concerned re toe ulcer , pt RN will eval and place dressing prior to d/c. Pt was active with [facility] for [facility] services. This will be resumed with HHPT added. Rolling walker ordered.  Pt son requesting tub seat, however will have HHPT eval bathtub first to assure the tub seat will fit into tub.  Expected Discharge Date:  10/14/2015               Expected Discharge Plan:  [place]  In-House Referral:  NA  Discharge planning Services  CM Consult  Post Acute Care Choice:  Durable Medical Equipment Choice offered to:  Patient  DME Arranged:  Walker rolling DME Agency:  [facility] Arranged:  RN, PT [facility] Agency:  [facility]  Status of Service:  Completed, signed off  Medicare Important Message Given:  Yes Date Medicare IM Given:    Medicare IM give by:    Date Additional Medicare IM Given:    Additional Medicare Important Message give by:     If discussed at [place] of Stay Meetings, dates discussed:    Additional Comments:  Adron Bene, RN 10/14/2015, 11:37 AM

## 2015-10-14 NOTE — Discharge Instructions (Signed)
Please resume your Pradaxa on 10/16/15

## 2015-10-14 NOTE — Progress Notes (Signed)
Pt provided with discharge information including information on follow up appointments as well as new medications. Pt and son verbalized understanding of all information. IV was dc'd without complication. Pt escorted out via wheelchair by NT.

## 2015-10-15 DIAGNOSIS — L03116 Cellulitis of left lower limb: Secondary | ICD-10-CM | POA: Diagnosis not present

## 2015-10-15 DIAGNOSIS — I2510 Atherosclerotic heart disease of native coronary artery without angina pectoris: Secondary | ICD-10-CM | POA: Diagnosis not present

## 2015-10-15 DIAGNOSIS — E785 Hyperlipidemia, unspecified: Secondary | ICD-10-CM | POA: Diagnosis not present

## 2015-10-15 DIAGNOSIS — I872 Venous insufficiency (chronic) (peripheral): Secondary | ICD-10-CM | POA: Diagnosis not present

## 2015-10-15 DIAGNOSIS — E119 Type 2 diabetes mellitus without complications: Secondary | ICD-10-CM | POA: Diagnosis not present

## 2015-10-15 DIAGNOSIS — L97221 Non-pressure chronic ulcer of left calf limited to breakdown of skin: Secondary | ICD-10-CM | POA: Diagnosis not present

## 2015-10-15 DIAGNOSIS — L97211 Non-pressure chronic ulcer of right calf limited to breakdown of skin: Secondary | ICD-10-CM | POA: Diagnosis not present

## 2015-10-15 DIAGNOSIS — I10 Essential (primary) hypertension: Secondary | ICD-10-CM | POA: Diagnosis not present

## 2015-10-15 DIAGNOSIS — I480 Paroxysmal atrial fibrillation: Secondary | ICD-10-CM | POA: Diagnosis not present

## 2015-10-16 DIAGNOSIS — L97211 Non-pressure chronic ulcer of right calf limited to breakdown of skin: Secondary | ICD-10-CM | POA: Diagnosis not present

## 2015-10-16 DIAGNOSIS — I480 Paroxysmal atrial fibrillation: Secondary | ICD-10-CM | POA: Diagnosis not present

## 2015-10-16 DIAGNOSIS — I10 Essential (primary) hypertension: Secondary | ICD-10-CM | POA: Diagnosis not present

## 2015-10-16 DIAGNOSIS — I2510 Atherosclerotic heart disease of native coronary artery without angina pectoris: Secondary | ICD-10-CM | POA: Diagnosis not present

## 2015-10-16 DIAGNOSIS — L03116 Cellulitis of left lower limb: Secondary | ICD-10-CM | POA: Diagnosis not present

## 2015-10-16 DIAGNOSIS — L97221 Non-pressure chronic ulcer of left calf limited to breakdown of skin: Secondary | ICD-10-CM | POA: Diagnosis not present

## 2015-10-16 DIAGNOSIS — I872 Venous insufficiency (chronic) (peripheral): Secondary | ICD-10-CM | POA: Diagnosis not present

## 2015-10-16 DIAGNOSIS — E119 Type 2 diabetes mellitus without complications: Secondary | ICD-10-CM | POA: Diagnosis not present

## 2015-10-16 DIAGNOSIS — E785 Hyperlipidemia, unspecified: Secondary | ICD-10-CM | POA: Diagnosis not present

## 2015-10-17 DIAGNOSIS — I2510 Atherosclerotic heart disease of native coronary artery without angina pectoris: Secondary | ICD-10-CM | POA: Diagnosis not present

## 2015-10-17 DIAGNOSIS — I480 Paroxysmal atrial fibrillation: Secondary | ICD-10-CM | POA: Diagnosis not present

## 2015-10-17 DIAGNOSIS — L97221 Non-pressure chronic ulcer of left calf limited to breakdown of skin: Secondary | ICD-10-CM | POA: Diagnosis not present

## 2015-10-17 DIAGNOSIS — L97211 Non-pressure chronic ulcer of right calf limited to breakdown of skin: Secondary | ICD-10-CM | POA: Diagnosis not present

## 2015-10-17 DIAGNOSIS — I10 Essential (primary) hypertension: Secondary | ICD-10-CM | POA: Diagnosis not present

## 2015-10-17 DIAGNOSIS — E119 Type 2 diabetes mellitus without complications: Secondary | ICD-10-CM | POA: Diagnosis not present

## 2015-10-17 DIAGNOSIS — E785 Hyperlipidemia, unspecified: Secondary | ICD-10-CM | POA: Diagnosis not present

## 2015-10-17 DIAGNOSIS — L03116 Cellulitis of left lower limb: Secondary | ICD-10-CM | POA: Diagnosis not present

## 2015-10-17 DIAGNOSIS — I872 Venous insufficiency (chronic) (peripheral): Secondary | ICD-10-CM | POA: Diagnosis not present

## 2015-10-18 ENCOUNTER — Telehealth: Payer: Self-pay | Admitting: Cardiovascular Disease

## 2015-10-18 ENCOUNTER — Encounter (HOSPITAL_COMMUNITY): Payer: Self-pay | Admitting: Gastroenterology

## 2015-10-18 NOTE — Telephone Encounter (Signed)
Follow Up  Pt c/o medication issue: 1. Name of Medication: Pradaxa    4. What is your medication issue? Causing interal bleeding. Recently released from the hospital after 6 days of liquid diet and was advised not restart the pradaxa. Please call back to discuss

## 2015-10-18 NOTE — Telephone Encounter (Signed)
Spoke with patient who states she was told by the GI doctor that she should not restart Pradaxa due to GI bleeding.  She states her discharge instructions state to restart on 4/16.  She has not taken pradaxa since 4/9.  She states she would like to switch to coumadin due to her husband's many years of being on it without problem.  I advised her that we will discuss with [facility] tomorrow and call her back to discuss dosage and schedule.  She verbalized understanding and agreement. She has an appointment so do not call between 11 am and 4 pm tomorrow. She thanked me for the call.

## 2015-10-19 ENCOUNTER — Other Ambulatory Visit: Payer: Self-pay | Admitting: Certified Registered Nurse Anesthetist

## 2015-10-19 DIAGNOSIS — E11621 Type 2 diabetes mellitus with foot ulcer: Secondary | ICD-10-CM | POA: Diagnosis not present

## 2015-10-19 DIAGNOSIS — M86372 Chronic multifocal osteomyelitis, left ankle and foot: Secondary | ICD-10-CM | POA: Diagnosis not present

## 2015-10-19 DIAGNOSIS — L97511 Non-pressure chronic ulcer of other part of right foot limited to breakdown of skin: Secondary | ICD-10-CM | POA: Diagnosis not present

## 2015-10-19 DIAGNOSIS — I872 Venous insufficiency (chronic) (peripheral): Secondary | ICD-10-CM | POA: Diagnosis not present

## 2015-10-19 DIAGNOSIS — G4730 Sleep apnea, unspecified: Secondary | ICD-10-CM | POA: Diagnosis not present

## 2015-10-19 DIAGNOSIS — I252 Old myocardial infarction: Secondary | ICD-10-CM | POA: Diagnosis not present

## 2015-10-19 DIAGNOSIS — I2510 Atherosclerotic heart disease of native coronary artery without angina pectoris: Secondary | ICD-10-CM | POA: Diagnosis not present

## 2015-10-19 DIAGNOSIS — E1151 Type 2 diabetes mellitus with diabetic peripheral angiopathy without gangrene: Secondary | ICD-10-CM | POA: Diagnosis not present

## 2015-10-19 DIAGNOSIS — I509 Heart failure, unspecified: Secondary | ICD-10-CM | POA: Diagnosis not present

## 2015-10-19 DIAGNOSIS — I890 Lymphedema, not elsewhere classified: Secondary | ICD-10-CM | POA: Diagnosis not present

## 2015-10-19 DIAGNOSIS — M65872 Other synovitis and tenosynovitis, left ankle and foot: Secondary | ICD-10-CM | POA: Diagnosis not present

## 2015-10-19 DIAGNOSIS — Z87891 Personal history of nicotine dependence: Secondary | ICD-10-CM | POA: Diagnosis not present

## 2015-10-19 DIAGNOSIS — L97522 Non-pressure chronic ulcer of other part of left foot with fat layer exposed: Secondary | ICD-10-CM | POA: Diagnosis not present

## 2015-10-19 DIAGNOSIS — L97512 Non-pressure chronic ulcer of other part of right foot with fat layer exposed: Secondary | ICD-10-CM | POA: Diagnosis not present

## 2015-10-19 DIAGNOSIS — E785 Hyperlipidemia, unspecified: Secondary | ICD-10-CM | POA: Diagnosis not present

## 2015-10-19 DIAGNOSIS — S91102A Unspecified open wound of left great toe without damage to nail, initial encounter: Secondary | ICD-10-CM | POA: Diagnosis not present

## 2015-10-19 DIAGNOSIS — L97521 Non-pressure chronic ulcer of other part of left foot limited to breakdown of skin: Secondary | ICD-10-CM | POA: Diagnosis not present

## 2015-10-19 DIAGNOSIS — I480 Paroxysmal atrial fibrillation: Secondary | ICD-10-CM | POA: Diagnosis not present

## 2015-10-19 DIAGNOSIS — L03032 Cellulitis of left toe: Secondary | ICD-10-CM | POA: Diagnosis not present

## 2015-10-19 MED ORDER — WARFARIN SODIUM 5 MG PO TABS: ORAL_TABLET | ORAL | Status: DC

## 2015-10-19 NOTE — Telephone Encounter (Signed)
Advised by [facility] to have patient start warfarin today per Dr. Elmarie Shiley instruction and to come in for new patient appointment on Monday April 24.  I have sent Rx to patient's pharmacy and scheduled appointment.  I will call patient after 4 pm to advise her.   Spoke with patient who verbalized understanding of instructions and agrees with plan of care.

## 2015-10-21 ENCOUNTER — Telehealth: Payer: Self-pay | Admitting: *Deleted

## 2015-10-21 DIAGNOSIS — L03116 Cellulitis of left lower limb: Secondary | ICD-10-CM | POA: Diagnosis not present

## 2015-10-21 DIAGNOSIS — E785 Hyperlipidemia, unspecified: Secondary | ICD-10-CM | POA: Diagnosis not present

## 2015-10-21 DIAGNOSIS — E119 Type 2 diabetes mellitus without complications: Secondary | ICD-10-CM | POA: Diagnosis not present

## 2015-10-21 DIAGNOSIS — I10 Essential (primary) hypertension: Secondary | ICD-10-CM | POA: Diagnosis not present

## 2015-10-21 DIAGNOSIS — I872 Venous insufficiency (chronic) (peripheral): Secondary | ICD-10-CM | POA: Diagnosis not present

## 2015-10-21 DIAGNOSIS — L97221 Non-pressure chronic ulcer of left calf limited to breakdown of skin: Secondary | ICD-10-CM | POA: Diagnosis not present

## 2015-10-21 DIAGNOSIS — L97211 Non-pressure chronic ulcer of right calf limited to breakdown of skin: Secondary | ICD-10-CM | POA: Diagnosis not present

## 2015-10-21 DIAGNOSIS — I2510 Atherosclerotic heart disease of native coronary artery without angina pectoris: Secondary | ICD-10-CM | POA: Diagnosis not present

## 2015-10-21 DIAGNOSIS — I480 Paroxysmal atrial fibrillation: Secondary | ICD-10-CM | POA: Diagnosis not present

## 2015-10-21 NOTE — Telephone Encounter (Signed)
Please lower her coumadin to 2.5 mg a day. Have her hold the coumadin tonight.  Check INR on Monday as scheduled.

## 2015-10-21 NOTE — Telephone Encounter (Signed)
The pt is advised and she verbalized understanding. 

## 2015-10-21 NOTE — Telephone Encounter (Signed)
Pharmacist at [facility] called and wanted to be sure that Dr Acie Fredrickson was aware that there is an increased chance of bleeding in patients who are on warfarin and amiodarone. Please advise. Thanks, MI

## 2015-10-24 ENCOUNTER — Ambulatory Visit (INDEPENDENT_AMBULATORY_CARE_PROVIDER_SITE_OTHER): Payer: Commercial Managed Care - HMO | Admitting: *Deleted

## 2015-10-24 DIAGNOSIS — Z7901 Long term (current) use of anticoagulants: Secondary | ICD-10-CM | POA: Diagnosis not present

## 2015-10-24 DIAGNOSIS — I4892 Unspecified atrial flutter: Secondary | ICD-10-CM | POA: Diagnosis not present

## 2015-10-24 DIAGNOSIS — I480 Paroxysmal atrial fibrillation: Secondary | ICD-10-CM

## 2015-10-24 LAB — POCT INR: INR: 1.3

## 2015-10-24 NOTE — Patient Instructions (Signed)

## 2015-10-25 ENCOUNTER — Telehealth: Payer: Self-pay | Admitting: Nurse Practitioner

## 2015-10-25 DIAGNOSIS — E785 Hyperlipidemia, unspecified: Secondary | ICD-10-CM | POA: Diagnosis not present

## 2015-10-25 DIAGNOSIS — L97211 Non-pressure chronic ulcer of right calf limited to breakdown of skin: Secondary | ICD-10-CM | POA: Diagnosis not present

## 2015-10-25 DIAGNOSIS — Z7901 Long term (current) use of anticoagulants: Secondary | ICD-10-CM | POA: Diagnosis not present

## 2015-10-25 DIAGNOSIS — I2510 Atherosclerotic heart disease of native coronary artery without angina pectoris: Secondary | ICD-10-CM | POA: Diagnosis not present

## 2015-10-25 DIAGNOSIS — I10 Essential (primary) hypertension: Secondary | ICD-10-CM | POA: Diagnosis not present

## 2015-10-25 DIAGNOSIS — E119 Type 2 diabetes mellitus without complications: Secondary | ICD-10-CM | POA: Diagnosis not present

## 2015-10-25 DIAGNOSIS — I480 Paroxysmal atrial fibrillation: Secondary | ICD-10-CM | POA: Diagnosis not present

## 2015-10-25 DIAGNOSIS — L97221 Non-pressure chronic ulcer of left calf limited to breakdown of skin: Secondary | ICD-10-CM | POA: Diagnosis not present

## 2015-10-25 DIAGNOSIS — L03116 Cellulitis of left lower limb: Secondary | ICD-10-CM | POA: Diagnosis not present

## 2015-10-25 DIAGNOSIS — I872 Venous insufficiency (chronic) (peripheral): Secondary | ICD-10-CM | POA: Diagnosis not present

## 2015-10-25 NOTE — Telephone Encounter (Signed)
Have her try breaking the Kdur in half - this should solve this issue       Previous Messages     ----- Message -----   From: Zenovia Jarred, RN   Sent: 10/24/2015 11:50 AM    To: Thayer Headings, MD, Emmaline Life, RN  Subject: Pts concern about Klor Con            Pt seen today for first [facility] appt. And verbalized that she took her Potassium tablet (Klor Con) and later that day noticed the Whole tablet in her stool, she states that Erythromycin does the same thing. Please advise pt. Thank you.       Left message for patient to call the office

## 2015-10-26 DIAGNOSIS — L97511 Non-pressure chronic ulcer of other part of right foot limited to breakdown of skin: Secondary | ICD-10-CM | POA: Diagnosis not present

## 2015-10-26 DIAGNOSIS — L97522 Non-pressure chronic ulcer of other part of left foot with fat layer exposed: Secondary | ICD-10-CM | POA: Diagnosis not present

## 2015-10-26 DIAGNOSIS — I872 Venous insufficiency (chronic) (peripheral): Secondary | ICD-10-CM | POA: Diagnosis not present

## 2015-10-26 DIAGNOSIS — I480 Paroxysmal atrial fibrillation: Secondary | ICD-10-CM | POA: Diagnosis not present

## 2015-10-26 DIAGNOSIS — I2510 Atherosclerotic heart disease of native coronary artery without angina pectoris: Secondary | ICD-10-CM | POA: Diagnosis not present

## 2015-10-26 DIAGNOSIS — E11621 Type 2 diabetes mellitus with foot ulcer: Secondary | ICD-10-CM | POA: Diagnosis not present

## 2015-10-26 DIAGNOSIS — L97521 Non-pressure chronic ulcer of other part of left foot limited to breakdown of skin: Secondary | ICD-10-CM | POA: Diagnosis not present

## 2015-10-26 DIAGNOSIS — I890 Lymphedema, not elsewhere classified: Secondary | ICD-10-CM | POA: Diagnosis not present

## 2015-10-26 DIAGNOSIS — E785 Hyperlipidemia, unspecified: Secondary | ICD-10-CM | POA: Diagnosis not present

## 2015-10-26 DIAGNOSIS — L03032 Cellulitis of left toe: Secondary | ICD-10-CM | POA: Diagnosis not present

## 2015-10-26 DIAGNOSIS — S91102A Unspecified open wound of left great toe without damage to nail, initial encounter: Secondary | ICD-10-CM | POA: Diagnosis not present

## 2015-10-27 NOTE — Telephone Encounter (Signed)
Reviewed advice with patient who verbalized understanding and agreement.

## 2015-10-28 ENCOUNTER — Ambulatory Visit (INDEPENDENT_AMBULATORY_CARE_PROVIDER_SITE_OTHER): Payer: Commercial Managed Care - HMO | Admitting: *Deleted

## 2015-10-28 DIAGNOSIS — I480 Paroxysmal atrial fibrillation: Secondary | ICD-10-CM | POA: Diagnosis not present

## 2015-10-28 DIAGNOSIS — I4892 Unspecified atrial flutter: Secondary | ICD-10-CM | POA: Diagnosis not present

## 2015-10-28 DIAGNOSIS — I872 Venous insufficiency (chronic) (peripheral): Secondary | ICD-10-CM | POA: Diagnosis not present

## 2015-10-28 DIAGNOSIS — L97211 Non-pressure chronic ulcer of right calf limited to breakdown of skin: Secondary | ICD-10-CM | POA: Diagnosis not present

## 2015-10-28 DIAGNOSIS — I10 Essential (primary) hypertension: Secondary | ICD-10-CM | POA: Diagnosis not present

## 2015-10-28 DIAGNOSIS — E119 Type 2 diabetes mellitus without complications: Secondary | ICD-10-CM | POA: Diagnosis not present

## 2015-10-28 DIAGNOSIS — Z7901 Long term (current) use of anticoagulants: Secondary | ICD-10-CM

## 2015-10-28 DIAGNOSIS — L97221 Non-pressure chronic ulcer of left calf limited to breakdown of skin: Secondary | ICD-10-CM | POA: Diagnosis not present

## 2015-10-28 DIAGNOSIS — I2510 Atherosclerotic heart disease of native coronary artery without angina pectoris: Secondary | ICD-10-CM | POA: Diagnosis not present

## 2015-10-28 DIAGNOSIS — E785 Hyperlipidemia, unspecified: Secondary | ICD-10-CM | POA: Diagnosis not present

## 2015-10-28 DIAGNOSIS — L03116 Cellulitis of left lower limb: Secondary | ICD-10-CM | POA: Diagnosis not present

## 2015-10-28 LAB — POCT INR: INR: 1.7

## 2015-11-01 DIAGNOSIS — I872 Venous insufficiency (chronic) (peripheral): Secondary | ICD-10-CM | POA: Diagnosis not present

## 2015-11-01 DIAGNOSIS — E785 Hyperlipidemia, unspecified: Secondary | ICD-10-CM | POA: Diagnosis not present

## 2015-11-01 DIAGNOSIS — L03116 Cellulitis of left lower limb: Secondary | ICD-10-CM | POA: Diagnosis not present

## 2015-11-01 DIAGNOSIS — I10 Essential (primary) hypertension: Secondary | ICD-10-CM | POA: Diagnosis not present

## 2015-11-01 DIAGNOSIS — E119 Type 2 diabetes mellitus without complications: Secondary | ICD-10-CM | POA: Diagnosis not present

## 2015-11-01 DIAGNOSIS — L97211 Non-pressure chronic ulcer of right calf limited to breakdown of skin: Secondary | ICD-10-CM | POA: Diagnosis not present

## 2015-11-01 DIAGNOSIS — I2510 Atherosclerotic heart disease of native coronary artery without angina pectoris: Secondary | ICD-10-CM | POA: Diagnosis not present

## 2015-11-01 DIAGNOSIS — L97221 Non-pressure chronic ulcer of left calf limited to breakdown of skin: Secondary | ICD-10-CM | POA: Diagnosis not present

## 2015-11-01 DIAGNOSIS — I480 Paroxysmal atrial fibrillation: Secondary | ICD-10-CM | POA: Diagnosis not present

## 2015-11-02 ENCOUNTER — Encounter (HOSPITAL_BASED_OUTPATIENT_CLINIC_OR_DEPARTMENT_OTHER): Payer: Commercial Managed Care - HMO | Attending: Surgery

## 2015-11-02 DIAGNOSIS — L97522 Non-pressure chronic ulcer of other part of left foot with fat layer exposed: Secondary | ICD-10-CM | POA: Diagnosis not present

## 2015-11-02 DIAGNOSIS — E11621 Type 2 diabetes mellitus with foot ulcer: Secondary | ICD-10-CM | POA: Insufficient documentation

## 2015-11-02 DIAGNOSIS — M869 Osteomyelitis, unspecified: Secondary | ICD-10-CM | POA: Insufficient documentation

## 2015-11-02 DIAGNOSIS — I890 Lymphedema, not elsewhere classified: Secondary | ICD-10-CM | POA: Diagnosis not present

## 2015-11-02 DIAGNOSIS — G4730 Sleep apnea, unspecified: Secondary | ICD-10-CM | POA: Insufficient documentation

## 2015-11-02 DIAGNOSIS — I2510 Atherosclerotic heart disease of native coronary artery without angina pectoris: Secondary | ICD-10-CM | POA: Insufficient documentation

## 2015-11-02 DIAGNOSIS — S91102A Unspecified open wound of left great toe without damage to nail, initial encounter: Secondary | ICD-10-CM | POA: Diagnosis not present

## 2015-11-02 DIAGNOSIS — I252 Old myocardial infarction: Secondary | ICD-10-CM | POA: Insufficient documentation

## 2015-11-02 DIAGNOSIS — I509 Heart failure, unspecified: Secondary | ICD-10-CM | POA: Insufficient documentation

## 2015-11-02 DIAGNOSIS — L97521 Non-pressure chronic ulcer of other part of left foot limited to breakdown of skin: Secondary | ICD-10-CM | POA: Diagnosis not present

## 2015-11-02 DIAGNOSIS — L97512 Non-pressure chronic ulcer of other part of right foot with fat layer exposed: Secondary | ICD-10-CM | POA: Diagnosis not present

## 2015-11-04 ENCOUNTER — Ambulatory Visit (INDEPENDENT_AMBULATORY_CARE_PROVIDER_SITE_OTHER): Payer: Commercial Managed Care - HMO | Admitting: Cardiovascular Disease

## 2015-11-04 DIAGNOSIS — I10 Essential (primary) hypertension: Secondary | ICD-10-CM | POA: Diagnosis not present

## 2015-11-04 DIAGNOSIS — L97211 Non-pressure chronic ulcer of right calf limited to breakdown of skin: Secondary | ICD-10-CM | POA: Diagnosis not present

## 2015-11-04 DIAGNOSIS — Z7901 Long term (current) use of anticoagulants: Secondary | ICD-10-CM

## 2015-11-04 DIAGNOSIS — I2510 Atherosclerotic heart disease of native coronary artery without angina pectoris: Secondary | ICD-10-CM | POA: Diagnosis not present

## 2015-11-04 DIAGNOSIS — I480 Paroxysmal atrial fibrillation: Secondary | ICD-10-CM | POA: Diagnosis not present

## 2015-11-04 DIAGNOSIS — I872 Venous insufficiency (chronic) (peripheral): Secondary | ICD-10-CM | POA: Diagnosis not present

## 2015-11-04 DIAGNOSIS — L03116 Cellulitis of left lower limb: Secondary | ICD-10-CM | POA: Diagnosis not present

## 2015-11-04 DIAGNOSIS — L97221 Non-pressure chronic ulcer of left calf limited to breakdown of skin: Secondary | ICD-10-CM | POA: Diagnosis not present

## 2015-11-04 DIAGNOSIS — E785 Hyperlipidemia, unspecified: Secondary | ICD-10-CM | POA: Diagnosis not present

## 2015-11-04 DIAGNOSIS — E119 Type 2 diabetes mellitus without complications: Secondary | ICD-10-CM | POA: Diagnosis not present

## 2015-11-04 LAB — POCT INR: INR: 5.2

## 2015-11-04 MED ORDER — WARFARIN SODIUM 2.5 MG PO TABS
ORAL_TABLET | ORAL | Status: DC
Start: 2015-11-04 — End: 2016-01-17

## 2015-11-07 DIAGNOSIS — L03116 Cellulitis of left lower limb: Secondary | ICD-10-CM | POA: Diagnosis not present

## 2015-11-07 DIAGNOSIS — E785 Hyperlipidemia, unspecified: Secondary | ICD-10-CM | POA: Diagnosis not present

## 2015-11-07 DIAGNOSIS — I10 Essential (primary) hypertension: Secondary | ICD-10-CM | POA: Diagnosis not present

## 2015-11-07 DIAGNOSIS — E119 Type 2 diabetes mellitus without complications: Secondary | ICD-10-CM | POA: Diagnosis not present

## 2015-11-07 DIAGNOSIS — L97221 Non-pressure chronic ulcer of left calf limited to breakdown of skin: Secondary | ICD-10-CM | POA: Diagnosis not present

## 2015-11-07 DIAGNOSIS — I480 Paroxysmal atrial fibrillation: Secondary | ICD-10-CM | POA: Diagnosis not present

## 2015-11-07 DIAGNOSIS — I2510 Atherosclerotic heart disease of native coronary artery without angina pectoris: Secondary | ICD-10-CM | POA: Diagnosis not present

## 2015-11-07 DIAGNOSIS — L97211 Non-pressure chronic ulcer of right calf limited to breakdown of skin: Secondary | ICD-10-CM | POA: Diagnosis not present

## 2015-11-07 DIAGNOSIS — I872 Venous insufficiency (chronic) (peripheral): Secondary | ICD-10-CM | POA: Diagnosis not present

## 2015-11-09 DIAGNOSIS — L97521 Non-pressure chronic ulcer of other part of left foot limited to breakdown of skin: Secondary | ICD-10-CM | POA: Diagnosis not present

## 2015-11-09 DIAGNOSIS — L97522 Non-pressure chronic ulcer of other part of left foot with fat layer exposed: Secondary | ICD-10-CM | POA: Diagnosis not present

## 2015-11-09 DIAGNOSIS — L97512 Non-pressure chronic ulcer of other part of right foot with fat layer exposed: Secondary | ICD-10-CM | POA: Diagnosis not present

## 2015-11-09 DIAGNOSIS — I890 Lymphedema, not elsewhere classified: Secondary | ICD-10-CM | POA: Diagnosis not present

## 2015-11-09 DIAGNOSIS — E11621 Type 2 diabetes mellitus with foot ulcer: Secondary | ICD-10-CM | POA: Diagnosis not present

## 2015-11-09 DIAGNOSIS — X35XXXA Volcanic eruption, initial encounter: Secondary | ICD-10-CM | POA: Diagnosis not present

## 2015-11-11 ENCOUNTER — Ambulatory Visit (INDEPENDENT_AMBULATORY_CARE_PROVIDER_SITE_OTHER): Payer: Commercial Managed Care - HMO | Admitting: Pharmacist

## 2015-11-11 DIAGNOSIS — E119 Type 2 diabetes mellitus without complications: Secondary | ICD-10-CM | POA: Diagnosis not present

## 2015-11-11 DIAGNOSIS — I2510 Atherosclerotic heart disease of native coronary artery without angina pectoris: Secondary | ICD-10-CM | POA: Diagnosis not present

## 2015-11-11 DIAGNOSIS — I872 Venous insufficiency (chronic) (peripheral): Secondary | ICD-10-CM | POA: Diagnosis not present

## 2015-11-11 DIAGNOSIS — I480 Paroxysmal atrial fibrillation: Secondary | ICD-10-CM

## 2015-11-11 DIAGNOSIS — L97221 Non-pressure chronic ulcer of left calf limited to breakdown of skin: Secondary | ICD-10-CM | POA: Diagnosis not present

## 2015-11-11 DIAGNOSIS — E785 Hyperlipidemia, unspecified: Secondary | ICD-10-CM | POA: Diagnosis not present

## 2015-11-11 DIAGNOSIS — L97211 Non-pressure chronic ulcer of right calf limited to breakdown of skin: Secondary | ICD-10-CM | POA: Diagnosis not present

## 2015-11-11 DIAGNOSIS — I10 Essential (primary) hypertension: Secondary | ICD-10-CM | POA: Diagnosis not present

## 2015-11-11 DIAGNOSIS — Z7901 Long term (current) use of anticoagulants: Secondary | ICD-10-CM

## 2015-11-11 DIAGNOSIS — L03116 Cellulitis of left lower limb: Secondary | ICD-10-CM | POA: Diagnosis not present

## 2015-11-11 LAB — POCT INR: INR: 2.8

## 2015-11-12 DIAGNOSIS — I2510 Atherosclerotic heart disease of native coronary artery without angina pectoris: Secondary | ICD-10-CM | POA: Diagnosis not present

## 2015-11-12 DIAGNOSIS — N183 Chronic kidney disease, stage 3 (moderate): Secondary | ICD-10-CM | POA: Diagnosis not present

## 2015-11-12 DIAGNOSIS — D692 Other nonthrombocytopenic purpura: Secondary | ICD-10-CM | POA: Diagnosis not present

## 2015-11-12 DIAGNOSIS — E11621 Type 2 diabetes mellitus with foot ulcer: Secondary | ICD-10-CM | POA: Diagnosis not present

## 2015-11-12 DIAGNOSIS — I1310 Hypertensive heart and chronic kidney disease without heart failure, with stage 1 through stage 4 chronic kidney disease, or unspecified chronic kidney disease: Secondary | ICD-10-CM | POA: Diagnosis not present

## 2015-11-12 DIAGNOSIS — Z6824 Body mass index (BMI) 24.0-24.9, adult: Secondary | ICD-10-CM | POA: Diagnosis not present

## 2015-11-12 DIAGNOSIS — K922 Gastrointestinal hemorrhage, unspecified: Secondary | ICD-10-CM | POA: Diagnosis not present

## 2015-11-12 DIAGNOSIS — Z7901 Long term (current) use of anticoagulants: Secondary | ICD-10-CM | POA: Diagnosis not present

## 2015-11-12 DIAGNOSIS — M5136 Other intervertebral disc degeneration, lumbar region: Secondary | ICD-10-CM | POA: Diagnosis not present

## 2015-11-14 DIAGNOSIS — E785 Hyperlipidemia, unspecified: Secondary | ICD-10-CM | POA: Diagnosis not present

## 2015-11-14 DIAGNOSIS — I872 Venous insufficiency (chronic) (peripheral): Secondary | ICD-10-CM | POA: Diagnosis not present

## 2015-11-14 DIAGNOSIS — I2510 Atherosclerotic heart disease of native coronary artery without angina pectoris: Secondary | ICD-10-CM | POA: Diagnosis not present

## 2015-11-14 DIAGNOSIS — I10 Essential (primary) hypertension: Secondary | ICD-10-CM | POA: Diagnosis not present

## 2015-11-14 DIAGNOSIS — L03116 Cellulitis of left lower limb: Secondary | ICD-10-CM | POA: Diagnosis not present

## 2015-11-14 DIAGNOSIS — L97211 Non-pressure chronic ulcer of right calf limited to breakdown of skin: Secondary | ICD-10-CM | POA: Diagnosis not present

## 2015-11-14 DIAGNOSIS — I480 Paroxysmal atrial fibrillation: Secondary | ICD-10-CM | POA: Diagnosis not present

## 2015-11-14 DIAGNOSIS — L97221 Non-pressure chronic ulcer of left calf limited to breakdown of skin: Secondary | ICD-10-CM | POA: Diagnosis not present

## 2015-11-14 DIAGNOSIS — E119 Type 2 diabetes mellitus without complications: Secondary | ICD-10-CM | POA: Diagnosis not present

## 2015-11-15 ENCOUNTER — Other Ambulatory Visit: Payer: Self-pay | Admitting: Surgery

## 2015-11-15 ENCOUNTER — Ambulatory Visit (HOSPITAL_COMMUNITY)
Admission: RE | Admit: 2015-11-15 | Discharge: 2015-11-15 | Disposition: A | Discharge status: Home / Self Care | Payer: Commercial Managed Care - HMO | Source: Ambulatory Visit | Attending: Vascular Surgery | Admitting: Vascular Surgery

## 2015-11-15 ENCOUNTER — Other Ambulatory Visit (HOSPITAL_COMMUNITY): Payer: Self-pay | Admitting: Internal Medicine

## 2015-11-15 DIAGNOSIS — L98499 Non-pressure chronic ulcer of skin of other sites with unspecified severity: Secondary | ICD-10-CM | POA: Insufficient documentation

## 2015-11-15 DIAGNOSIS — E785 Hyperlipidemia, unspecified: Secondary | ICD-10-CM | POA: Diagnosis not present

## 2015-11-15 DIAGNOSIS — E119 Type 2 diabetes mellitus without complications: Secondary | ICD-10-CM | POA: Diagnosis not present

## 2015-11-15 DIAGNOSIS — I8391 Asymptomatic varicose veins of right lower extremity: Secondary | ICD-10-CM | POA: Insufficient documentation

## 2015-11-15 DIAGNOSIS — I2510 Atherosclerotic heart disease of native coronary artery without angina pectoris: Secondary | ICD-10-CM | POA: Insufficient documentation

## 2015-11-16 ENCOUNTER — Emergency Department (HOSPITAL_COMMUNITY): Payer: Commercial Managed Care - HMO

## 2015-11-16 ENCOUNTER — Emergency Department (HOSPITAL_COMMUNITY)
Admission: EM | Admit: 2015-11-16 | Discharge: 2015-11-16 | Disposition: A | Discharge status: Home / Self Care | Payer: Commercial Managed Care - HMO | Attending: Emergency Medicine | Admitting: Emergency Medicine

## 2015-11-16 DIAGNOSIS — Y998 Other external cause status: Secondary | ICD-10-CM | POA: Insufficient documentation

## 2015-11-16 DIAGNOSIS — S0993XA Unspecified injury of face, initial encounter: Secondary | ICD-10-CM | POA: Diagnosis not present

## 2015-11-16 DIAGNOSIS — S6991XA Unspecified injury of right wrist, hand and finger(s), initial encounter: Secondary | ICD-10-CM | POA: Diagnosis not present

## 2015-11-16 DIAGNOSIS — Z7901 Long term (current) use of anticoagulants: Secondary | ICD-10-CM | POA: Insufficient documentation

## 2015-11-16 DIAGNOSIS — Z79899 Other long term (current) drug therapy: Secondary | ICD-10-CM | POA: Insufficient documentation

## 2015-11-16 DIAGNOSIS — M542 Cervicalgia: Secondary | ICD-10-CM | POA: Diagnosis not present

## 2015-11-16 DIAGNOSIS — E119 Type 2 diabetes mellitus without complications: Secondary | ICD-10-CM | POA: Insufficient documentation

## 2015-11-16 DIAGNOSIS — M1990 Unspecified osteoarthritis, unspecified site: Secondary | ICD-10-CM | POA: Diagnosis not present

## 2015-11-16 DIAGNOSIS — Z8673 Personal history of transient ischemic attack (TIA), and cerebral infarction without residual deficits: Secondary | ICD-10-CM | POA: Diagnosis not present

## 2015-11-16 DIAGNOSIS — Y9289 Other specified places as the place of occurrence of the external cause: Secondary | ICD-10-CM | POA: Insufficient documentation

## 2015-11-16 DIAGNOSIS — S032XXA Dislocation of tooth, initial encounter: Secondary | ICD-10-CM | POA: Diagnosis not present

## 2015-11-16 DIAGNOSIS — Z87891 Personal history of nicotine dependence: Secondary | ICD-10-CM | POA: Insufficient documentation

## 2015-11-16 DIAGNOSIS — Z23 Encounter for immunization: Secondary | ICD-10-CM | POA: Insufficient documentation

## 2015-11-16 DIAGNOSIS — S199XXA Unspecified injury of neck, initial encounter: Secondary | ICD-10-CM | POA: Diagnosis not present

## 2015-11-16 DIAGNOSIS — Z8669 Personal history of other diseases of the nervous system and sense organs: Secondary | ICD-10-CM | POA: Insufficient documentation

## 2015-11-16 DIAGNOSIS — R791 Abnormal coagulation profile: Secondary | ICD-10-CM | POA: Insufficient documentation

## 2015-11-16 DIAGNOSIS — S0181XA Laceration without foreign body of other part of head, initial encounter: Secondary | ICD-10-CM | POA: Diagnosis not present

## 2015-11-16 DIAGNOSIS — Z880 Allergy status to penicillin: Secondary | ICD-10-CM | POA: Insufficient documentation

## 2015-11-16 DIAGNOSIS — Z7984 Long term (current) use of oral hypoglycemic drugs: Secondary | ICD-10-CM | POA: Diagnosis not present

## 2015-11-16 DIAGNOSIS — I2510 Atherosclerotic heart disease of native coronary artery without angina pectoris: Secondary | ICD-10-CM | POA: Insufficient documentation

## 2015-11-16 DIAGNOSIS — S0242XA Fracture of alveolus of maxilla, initial encounter for closed fracture: Secondary | ICD-10-CM | POA: Diagnosis not present

## 2015-11-16 DIAGNOSIS — E785 Hyperlipidemia, unspecified: Secondary | ICD-10-CM | POA: Insufficient documentation

## 2015-11-16 DIAGNOSIS — Y9389 Activity, other specified: Secondary | ICD-10-CM | POA: Diagnosis not present

## 2015-11-16 DIAGNOSIS — W010XXA Fall on same level from slipping, tripping and stumbling without subsequent striking against object, initial encounter: Secondary | ICD-10-CM | POA: Insufficient documentation

## 2015-11-16 DIAGNOSIS — W19XXXA Unspecified fall, initial encounter: Secondary | ICD-10-CM

## 2015-11-16 DIAGNOSIS — Z7952 Long term (current) use of systemic steroids: Secondary | ICD-10-CM | POA: Insufficient documentation

## 2015-11-16 DIAGNOSIS — I252 Old myocardial infarction: Secondary | ICD-10-CM | POA: Diagnosis not present

## 2015-11-16 DIAGNOSIS — K002 Abnormalities of size and form of teeth: Secondary | ICD-10-CM | POA: Insufficient documentation

## 2015-11-16 DIAGNOSIS — S0990XA Unspecified injury of head, initial encounter: Secondary | ICD-10-CM | POA: Diagnosis not present

## 2015-11-16 DIAGNOSIS — Z9861 Coronary angioplasty status: Secondary | ICD-10-CM | POA: Insufficient documentation

## 2015-11-16 DIAGNOSIS — R51 Headache: Secondary | ICD-10-CM | POA: Diagnosis not present

## 2015-11-16 DIAGNOSIS — R000 Tachycardia, unspecified: Secondary | ICD-10-CM | POA: Diagnosis not present

## 2015-11-16 DIAGNOSIS — R079 Chest pain, unspecified: Secondary | ICD-10-CM | POA: Diagnosis not present

## 2015-11-16 LAB — CBC WITH DIFFERENTIAL/PLATELET
BASOS ABS: 0 10*3/uL (ref 0.0–0.1)
Basophils Relative: 0 %
Eosinophils Absolute: 0 10*3/uL (ref 0.0–0.7)
Eosinophils Relative: 1 %
HEMATOCRIT: 31.6 % — AB (ref 36.0–46.0)
HEMOGLOBIN: 10.3 g/dL — AB (ref 12.0–15.0)
LYMPHS ABS: 0.8 10*3/uL (ref 0.7–4.0)
LYMPHS PCT: 13 %
MCH: 33.7 pg (ref 26.0–34.0)
MCHC: 32.6 g/dL (ref 30.0–36.0)
MCV: 103.3 fL — AB (ref 78.0–100.0)
Monocytes Absolute: 0.6 10*3/uL (ref 0.1–1.0)
Monocytes Relative: 9 %
NEUTROS ABS: 5 10*3/uL (ref 1.7–7.7)
NEUTROS PCT: 77 %
PLATELETS: 193 10*3/uL (ref 150–400)
RBC: 3.06 MIL/uL — AB (ref 3.87–5.11)
RDW: 17 % — ABNORMAL HIGH (ref 11.5–15.5)
WBC: 6.4 10*3/uL (ref 4.0–10.5)

## 2015-11-16 LAB — COMPREHENSIVE METABOLIC PANEL
ALBUMIN: 3 g/dL — AB (ref 3.5–5.0)
ALT: 33 U/L (ref 14–54)
ANION GAP: 11 (ref 5–15)
AST: 41 U/L (ref 15–41)
Alkaline Phosphatase: 79 U/L (ref 38–126)
BILIRUBIN TOTAL: 0.8 mg/dL (ref 0.3–1.2)
BUN: 21 mg/dL — ABNORMAL HIGH (ref 6–20)
CHLORIDE: 107 mmol/L (ref 101–111)
CO2: 23 mmol/L (ref 22–32)
Calcium: 8.7 mg/dL — ABNORMAL LOW (ref 8.9–10.3)
Creatinine, Ser: 0.96 mg/dL (ref 0.44–1.00)
GFR calc Af Amer: >60 mL/min (ref >60)
GFR, EST NON AFRICAN AMERICAN: 54 mL/min — AB (ref >60)
Glucose, Bld: 123 mg/dL — ABNORMAL HIGH (ref 65–99)
POTASSIUM: 3.6 mmol/L (ref 3.5–5.1)
Sodium: 141 mmol/L (ref 135–145)
TOTAL PROTEIN: 5.9 g/dL — AB (ref 6.5–8.1)

## 2015-11-16 LAB — PROTIME-INR
INR: 3.22 — ABNORMAL HIGH (ref 0.00–1.49)
Prothrombin Time: 32.3 seconds — ABNORMAL HIGH (ref 11.6–15.2)

## 2015-11-16 MED ORDER — PHYTONADIONE 5 MG PO TABS
5.0000 mg | ORAL_TABLET | Freq: Once | ORAL | Status: AC
  Administered 2015-11-16: 5 mg via ORAL
  Filled 2015-11-16: qty 1

## 2015-11-16 MED ORDER — LIDOCAINE-EPINEPHRINE (PF) 2 %-1:200000 IJ SOLN
10.0000 mL | Freq: Once | INTRAMUSCULAR | Status: AC
  Administered 2015-11-16: 10 mL via INTRADERMAL
  Filled 2015-11-16: qty 20

## 2015-11-16 MED ORDER — HYDROCODONE-ACETAMINOPHEN 5-325 MG PO TABS: 0.5000 | ORAL_TABLET | ORAL | Status: DC | PRN

## 2015-11-16 MED ORDER — TETANUS-DIPHTH-ACELL PERTUSSIS 5-2.5-18.5 LF-MCG/0.5 IM SUSP
0.5000 mL | Freq: Once | INTRAMUSCULAR | Status: AC
  Administered 2015-11-16: 0.5 mL via INTRAMUSCULAR
  Filled 2015-11-16: qty 0.5

## 2015-11-16 MED ORDER — PHYTONADIONE 5 MG PO TABS
10.0000 mg | ORAL_TABLET | Freq: Once | ORAL | Status: DC
  Filled 2015-11-16: qty 2

## 2015-11-16 MED ORDER — METOPROLOL TARTRATE 5 MG/5ML IV SOLN
10.0000 mg | Freq: Once | INTRAVENOUS | Status: AC
  Administered 2015-11-16: 10 mg via INTRAVENOUS
  Filled 2015-11-16: qty 10

## 2015-11-16 MED ORDER — HYDROCODONE-ACETAMINOPHEN 5-325 MG PO TABS: 0.5000 | ORAL_TABLET | Freq: Once | ORAL | Status: DC

## 2015-11-16 MED ORDER — SODIUM CHLORIDE 0.9 % IV BOLUS (SEPSIS)
1000.0000 mL | Freq: Once | INTRAVENOUS | Status: AC
  Administered 2015-11-16: 1000 mL via INTRAVENOUS

## 2015-11-16 MED ORDER — METOPROLOL TARTRATE 5 MG/5ML IV SOLN
5.0000 mg | Freq: Once | INTRAVENOUS | Status: AC
  Administered 2015-11-16: 5 mg via INTRAVENOUS
  Filled 2015-11-16: qty 5

## 2015-11-16 NOTE — ED Notes (Signed)
Pt transported to CT ?

## 2015-11-16 NOTE — ED Provider Notes (Signed)
  Physical Exam  BP 110/81 mmHg  Pulse 108  Temp(Src) 98.1 F (36.7 C)  Resp 8  SpO2 95%  Fall at doctor' office CT's negative + supratherapeutic INR Ambulatory  Physical Exam Physical Exam  Nursing note and vitals reviewed. Constitutional: She is oriented to person, place, and time. She appears well-developed and well-nourished. No distress.  HENT:  Head: Normocephalic Bruising to the lips. Luxation of the teeth, firmly planted.  Eyes: Conjunctivae normal and EOM are normal. Pupils are equal, round, and reactive to light. No scleral icterus.  Neck: Normal range of motion.  Cardiovascular: Normal rate, regular rhythm and normal heart sounds.  Exam reveals no gallop and no friction rub.   No murmur heard. Pulmonary/Chest: Effort normal and breath sounds normal. No respiratory distress.  Abdominal: Soft. Bowel sounds are normal. She exhibits no distension and no mass. There is no tenderness. There is no guarding.  Neurological: She is alert and oriented to person, place, and time.  Skin: Skin is warm and dry. She is not diaphoretic.    ED Course  Procedures  MDM Filed Vitals:   11/16/15 1315 11/16/15 1413 11/16/15 1430 11/16/15 1515  BP: 123/93 111/88 119/93 110/81  Pulse: 116 111 104 108  Temp:      Resp: 13 10 18 8   SpO2: 93% 100% 98% 95%     Patient givne 5mg   Vit K and hold coumadin. Call tomorrow for dental follow up. Call coumadin clinic for recheck of INR in the next week. Appears safe for discharge. Case discussed with Dr. Wilson Singer who agrees with POC.    Margarita Mail, PA-C 11/17/15 0154  Virgel Manifold, MD 11/24/15 Shelah Lewandowsky

## 2015-11-16 NOTE — ED Provider Notes (Signed)
CSN: [id number]     Arrival date & time 11/16/15  1158 History   First MD Initiated Contact with Patient 11/16/15 1218     Chief Complaint  Patient presents with  . Fall    HPI Comments: 82 year old female presents with a mechanical fall.  She is at [facility] today for an appointment for wound care on her left toe when she tripped over a curb and fell onto her face.  Past medical history history significant for A. Fib currently on warfarin, CAD, CHF, diabetes, chronic stasis dermatitis, cellulitis of the left great toe, dyslipidemia, multiple falls. She was recently hospitalized from 4/9-4/13 for GIB which has resolved per patient.  Denies loss of consciousness, vision changes, chest pain, shortness of breath, abdominal pain, nausea, vomiting, numbness or tingling. She has a headache facial laceration, R wrist pain.        Patient is a 82 y.o. female presenting with fall.  Fall Associated symptoms include arthralgias and headaches. Pertinent negatives include no abdominal pain or chest pain.    Past Medical History  Diagnosis Date  . CAD (coronary artery disease)     a. 05/2006 Cath/PCI: LAD 56m, D1 40 ost, LCX nl, OM1 95 (3.0x20 Taxus DES), RCA nl, EF 40% w/ lat AK;  04/2007 Ex MV: minimal lat ischemia in area of prior infarct->low risk-> med Rx.  [name] Hyperlipidemia   . Diabetes mellitus   . MVP (mitral valve prolapse)     a. 10/2003 Echo: nl LV fxn, mild MR/TR/AS  . Dysrhythmia     atrial fib& flutter  . Myocardial infarction ([place])   . Sleep apnea   . Transient ischemic attack   . Arthritis   . Baker's cyst of knee    Past Surgical History  Procedure Laterality Date  . Tonsillectomy    . Coronary angioplasty      Status post PTCA and stenting of the first obtuse marginal-05/13/2006. We placed a 3.0 x 20 mm Taxus stent. It was post dilated using a 3.25 mm noncompliant balloon up to 14 atmospheres  . Cataracts    . Atrial flutter ablation N/A 10/09/2012    Procedure: ATRIAL FLUTTER  ABLATION;  Surgeon: Thompson Grayer, MD;  Location: [facility] CATH LAB;  Service: Cardiovascular;  Laterality: N/A;  . Colonoscopy N/A 10/13/2015    Procedure: COLONOSCOPY;  Surgeon: Manus Gunning, MD;  Location: [facility] ENDOSCOPY;  Service: Gastroenterology;  Laterality: N/A;   Family History  Problem Relation Age of Onset  . Heart attack Father   . Heart attack Mother   . Heart attack Brother   . Coronary artery disease Sister     CABG  . Alzheimer's disease Sister     X3  . Stroke Mother   . Stroke Sister   . Heart attack Sister    Social History  Substance Use Topics  . Smoking status: Former Smoker -- 1.00 packs/day    Types: Cigarettes    Quit date: 07/03/1963  . Smokeless tobacco: Never Used  . Alcohol Use: No   OB History    No data available     Review of Systems  Respiratory: Negative for shortness of breath.   Cardiovascular: Negative for chest pain.  Gastrointestinal: Negative for abdominal pain.  Musculoskeletal: Positive for arthralgias.  Skin: Positive for wound.  Neurological: Positive for headaches. Negative for syncope and speech difficulty.  All other systems reviewed and are negative.     Allergies  Demerol; Erythromycin; Penicillins; and Percocet  Home Medications  Prior to Admission medications   Medication Sig Start Date End Date Taking? Authorizing Provider  amiodarone (PACERONE) 200 MG tablet Take 1 tablet (200 mg total) by mouth daily. 07/22/15   Thayer Headings, MD  atorvastatin (LIPITOR) 20 MG tablet TAKE ONE TABLET BY MOUTH ONE TIME DAILY 04/08/15   Thayer Headings, MD  furosemide (LASIX) 20 MG tablet Take 20 mg by mouth daily. 07/15/15   Historical Provider, MD  HYDROcodone-acetaminophen (NORCO/VICODIN) 5-325 MG tablet TAKE 1/2 TO 1 TABLET BY MOUTH 2-3 TIMES DAILY AS NEEDED FOR SEVERE PAIN 02/04/15   Historical Provider, MD  hydrocortisone (PROCTO-PAK) 1 % CREA Apply 1 application topically daily. 10/14/15   Donne Hazel, MD  KLOR-CON 10 10 MEQ  tablet Take 10 mEq by mouth daily. 07/15/15   Historical Provider, MD  metFORMIN (GLUCOPHAGE) 500 MG tablet Take 500 mg by mouth 2 (two) times daily.  04/06/15   Historical Provider, MD  metoprolol succinate (TOPROL-XL) 50 MG 24 hr tablet Take 25 mg by mouth 2 (two) times daily. Take with or immediately following a meal.    Historical Provider, MD  nitroGLYCERIN (NITROSTAT) 0.4 MG SL tablet Place 0.4 mg under the tongue every 5 (five) minutes x 3 doses as needed for chest pain. Up to 3 doses, if pain continues, call 911. 06/05/11   Thayer Headings, MD  saccharomyces boulardii (FLORASTOR) 250 MG capsule Take 1 capsule (250 mg total) by mouth 2 (two) times daily. 10/14/15   Donne Hazel, MD  warfarin (COUMADIN) 2.5 MG tablet Take as directed by [facility]. 11/04/15   Thayer Headings, MD   BP 119/88 mmHg  Temp(Src) 98.1 F (36.7 C)  Resp 13  SpO2 94%   Physical Exam  Constitutional: She is oriented to person, place, and time. She appears well-developed and well-nourished. No distress.  Patient is in [place]. NAD. Blood on face and hair  HENT:  Head: Normocephalic. Head is with laceration.  Right Ear: No hemotympanum.  Left Ear: No hemotympanum.  Nose: No epistaxis.  Mouth/Throat: Uvula is midline and oropharynx is clear and moist. Abnormal dentition.  Upper right incisors are moveable  Eyes: Conjunctivae are normal. Pupils are equal, round, and reactive to light. Right eye exhibits no discharge. Left eye exhibits no discharge. No scleral icterus.  Neck: Normal range of motion. Neck supple. No tracheal deviation present.  No midline tenderness  Cardiovascular: Regular rhythm.  Tachycardia present.  Exam reveals no gallop and no friction rub.   No murmur heard. Pulmonary/Chest: Effort normal and breath sounds normal. No stridor. No respiratory distress. She has no wheezes. She has no rales. She exhibits no tenderness.  Abdominal: Soft. Bowel sounds are normal. She exhibits no distension and  no mass. There is no tenderness. There is no rebound and no guarding.  Musculoskeletal:  R wrist tenderness  Neurological: She is alert and oriented to person, place, and time.  Mental Status:  Alert, oriented, thought content appropriate, able to give a coherent history. Speech fluent without evidence of aphasia. Able to follow 2 step commands without difficulty.  Cranial Nerves:  II:  Peripheral visual fields grossly normal, pupils equal, round, reactive to light III,IV, VI: ptosis not present, extra-ocular motions intact bilaterally  V,VII: smile symmetric, facial light touch sensation equal VIII: hearing grossly normal to voice  X: uvula elevates symmetrically  XI: bilateral shoulder shrug symmetric and strong XII: midline tongue extension without fassiculations Motor:  Normal tone. 3/5 in upper and lower extremities  bilaterally including strong and equal grip strength and dorsiflexion/plantar flexion Sensory: Pinprick and light touch normal in all extremities.  Deep Tendon Reflexes: 2+ and symmetric in the biceps and patella Cerebellar: normal finger-to-nose with bilateral upper extremities Gait: normal gait and balance CV: distal pulses palpable throughout     Skin: Skin is warm and dry. She is not diaphoretic.  2 cm laceration over chin  Psychiatric: She has a normal mood and affect.    ED Course  Procedures (including critical care time) Labs Review Labs Reviewed  COMPREHENSIVE METABOLIC PANEL - Abnormal; Notable for the following:    Glucose, Bld 123 (*)    BUN 21 (*)    Calcium 8.7 (*)    Total Protein 5.9 (*)    Albumin 3.0 (*)    GFR calc non Af Amer 54 (*)    All other components within normal limits  CBC WITH DIFFERENTIAL/PLATELET - Abnormal; Notable for the following:    RBC 3.06 (*)    Hemoglobin 10.3 (*)    HCT 31.6 (*)    MCV 103.3 (*)    RDW 17.0 (*)    All other components within normal limits  PROTIME-INR - Abnormal; Notable for the following:     Prothrombin Time 32.3 (*)    INR 3.22 (*)    All other components within normal limits    Imaging Review Dg Chest 2 View  11/16/2015  CLINICAL DATA:  Lower chest pain after fall today. Initial encounter. EXAM: CHEST  2 VIEW COMPARISON:  October 11, 2015. FINDINGS: Stable cardiomegaly. No pneumothorax is noted. Mild diffuse bilateral pulmonary edema is noted with mild bilateral pleural effusions. Bony thorax is unremarkable. IMPRESSION: Cardiomegaly with bilateral pulmonary edema and mild pleural effusions suggesting congestive heart failure. Electronically Signed   By: Marijo Conception, M.D.   On: 11/16/2015 16:08   Dg Wrist Complete Right  11/16/2015  CLINICAL DATA:  Tripped and fell for today onto right wrist. Pain and bruising anteriorly. EXAM: RIGHT WRIST - COMPLETE 3+ VIEW COMPARISON:  None. FINDINGS: Diffuse decreased bone mineralization. Degenerative change of the radiocarpal joint and radial side of the carpal bones as well as the first carpometacarpal joint. Mild degenerative change of the first MCP joint and interphalangeal joint. Minimal degenerative change of the fifth MTP joint. Evidence of possible fracture of the base of the first metacarpal without significant displacement. IMPRESSION: Possible fracture the base of the first metacarpal without significant displacement. Degenerative changes as described. Electronically Signed   By: Marin Olp M.D.   On: 11/16/2015 13:44   Ct Head Wo Contrast  11/16/2015  CLINICAL DATA:  82 year old with facial pain after falling today. No loss of consciousness. Initial encounter. EXAM: CT HEAD WITHOUT CONTRAST CT MAXILLOFACIAL WITHOUT CONTRAST CT CERVICAL SPINE WITHOUT CONTRAST TECHNIQUE: Multidetector CT imaging of the head, cervical spine, and maxillofacial structures were performed using the standard protocol without intravenous contrast. Multiplanar CT image reconstructions of the cervical spine and maxillofacial structures were also generated.  COMPARISON:  CT head 02/15/2015.  Chest radiographs today. FINDINGS: CT HEAD FINDINGS Brain: There is no evidence of acute intracranial hemorrhage, mass lesion, brain edema or extra-axial fluid collection. The ventricles and subarachnoid spaces are prominent but stable. There is no CT evidence of acute cortical infarction. There is stable relatively mild periventricular white matter disease. Bones/sinuses/visualized face: The visualized paranasal sinuses, mastoid air cells and middle ears are clear. The calvarium is intact. CT MAXILLOFACIAL FINDINGS Probable soft tissue laceration anterior to the  mandibular symphysis with a small amount of emphysema. No evidence of underlying facial fracture. The paranasal sinuses, mastoid air cells and middle ears are clear. The globes are intact. There is no orbital hematoma. Several teeth are absent. Mild TMJ degenerative changes are present bilaterally. CT CERVICAL SPINE FINDINGS There is a mild cervical thoracic scoliosis with straightening of the usual cervical lordosis. No evidence of acute fracture or traumatic subluxation. There is multilevel spondylosis with disc space loss and uncinate spurring, greatest at C5-6 and C6-7. The left C4-5 facet joint appears ankylosed. No acute soft tissue findings are seen within the neck. There is mild thyroid nodularity. There is a dependent pleural effusion on the right which appears moderate in volume. There are patchy ground-glass opacities at both lung apices which may reflect edema as correlated with the earlier chest radiographs. IMPRESSION: 1. No acute intracranial or calvarial findings. 2. No evidence of maxillofacial or cervical spine acute fracture. 3. Multilevel cervical spondylosis. 4. Right pleural effusion and patchy ground-glass opacities at both lung apices, probably edema as correlated with earlier chest radiographs. Chest radiographic follow up recommended. Electronically Signed   By: Richardean Sale M.D.   On:  11/16/2015 13:51   Ct Cervical Spine Wo Contrast  11/16/2015  CLINICAL DATA:  82 year old with facial pain after falling today. No loss of consciousness. Initial encounter. EXAM: CT HEAD WITHOUT CONTRAST CT MAXILLOFACIAL WITHOUT CONTRAST CT CERVICAL SPINE WITHOUT CONTRAST TECHNIQUE: Multidetector CT imaging of the head, cervical spine, and maxillofacial structures were performed using the standard protocol without intravenous contrast. Multiplanar CT image reconstructions of the cervical spine and maxillofacial structures were also generated. COMPARISON:  CT head 02/15/2015.  Chest radiographs today. FINDINGS: CT HEAD FINDINGS Brain: There is no evidence of acute intracranial hemorrhage, mass lesion, brain edema or extra-axial fluid collection. The ventricles and subarachnoid spaces are prominent but stable. There is no CT evidence of acute cortical infarction. There is stable relatively mild periventricular white matter disease. Bones/sinuses/visualized face: The visualized paranasal sinuses, mastoid air cells and middle ears are clear. The calvarium is intact. CT MAXILLOFACIAL FINDINGS Probable soft tissue laceration anterior to the mandibular symphysis with a small amount of emphysema. No evidence of underlying facial fracture. The paranasal sinuses, mastoid air cells and middle ears are clear. The globes are intact. There is no orbital hematoma. Several teeth are absent. Mild TMJ degenerative changes are present bilaterally. CT CERVICAL SPINE FINDINGS There is a mild cervical thoracic scoliosis with straightening of the usual cervical lordosis. No evidence of acute fracture or traumatic subluxation. There is multilevel spondylosis with disc space loss and uncinate spurring, greatest at C5-6 and C6-7. The left C4-5 facet joint appears ankylosed. No acute soft tissue findings are seen within the neck. There is mild thyroid nodularity. There is a dependent pleural effusion on the right which appears moderate in  volume. There are patchy ground-glass opacities at both lung apices which may reflect edema as correlated with the earlier chest radiographs. IMPRESSION: 1. No acute intracranial or calvarial findings. 2. No evidence of maxillofacial or cervical spine acute fracture. 3. Multilevel cervical spondylosis. 4. Right pleural effusion and patchy ground-glass opacities at both lung apices, probably edema as correlated with earlier chest radiographs. Chest radiographic follow up recommended. Electronically Signed   By: Richardean Sale M.D.   On: 11/16/2015 13:51   Ct Maxillofacial Wo Cm  11/16/2015  CLINICAL DATA:  82 year old with facial pain after falling today. No loss of consciousness. Initial encounter. EXAM: CT HEAD WITHOUT CONTRAST  CT MAXILLOFACIAL WITHOUT CONTRAST CT CERVICAL SPINE WITHOUT CONTRAST TECHNIQUE: Multidetector CT imaging of the head, cervical spine, and maxillofacial structures were performed using the standard protocol without intravenous contrast. Multiplanar CT image reconstructions of the cervical spine and maxillofacial structures were also generated. COMPARISON:  CT head 02/15/2015.  Chest radiographs today. FINDINGS: CT HEAD FINDINGS Brain: There is no evidence of acute intracranial hemorrhage, mass lesion, brain edema or extra-axial fluid collection. The ventricles and subarachnoid spaces are prominent but stable. There is no CT evidence of acute cortical infarction. There is stable relatively mild periventricular white matter disease. Bones/sinuses/visualized face: The visualized paranasal sinuses, mastoid air cells and middle ears are clear. The calvarium is intact. CT MAXILLOFACIAL FINDINGS Probable soft tissue laceration anterior to the mandibular symphysis with a small amount of emphysema. No evidence of underlying facial fracture. The paranasal sinuses, mastoid air cells and middle ears are clear. The globes are intact. There is no orbital hematoma. Several teeth are absent. Mild TMJ  degenerative changes are present bilaterally. CT CERVICAL SPINE FINDINGS There is a mild cervical thoracic scoliosis with straightening of the usual cervical lordosis. No evidence of acute fracture or traumatic subluxation. There is multilevel spondylosis with disc space loss and uncinate spurring, greatest at C5-6 and C6-7. The left C4-5 facet joint appears ankylosed. No acute soft tissue findings are seen within the neck. There is mild thyroid nodularity. There is a dependent pleural effusion on the right which appears moderate in volume. There are patchy ground-glass opacities at both lung apices which may reflect edema as correlated with the earlier chest radiographs. IMPRESSION: 1. No acute intracranial or calvarial findings. 2. No evidence of maxillofacial or cervical spine acute fracture. 3. Multilevel cervical spondylosis. 4. Right pleural effusion and patchy ground-glass opacities at both lung apices, probably edema as correlated with earlier chest radiographs. Chest radiographic follow up recommended. Electronically Signed   By: Richardean Sale M.D.   On: 11/16/2015 13:51   I have personally reviewed and evaluated these images and lab results as part of my medical decision-making.   EKG Interpretation   Date/Time:  Wednesday Nov 16 2015 12:09:03 EDT Ventricular Rate:  112 PR Interval:    QRS Duration: 120 QT Interval:  378 QTC Calculation: 516 R Axis:   -69 Text Interpretation:  Atrial fibrillation RBBB and LAFB Probable  anteroseptal infarct, old No significant change since last tracing  Confirmed by FLOYD MD, DANIEL [phone]) on 11/16/2015 12:15:50 PM     LACERATION REPAIR Performed by: Recardo Evangelist Authorized by: Recardo Evangelist Consent: Verbal consent obtained. Risks and benefits: risks, benefits and alternatives were discussed Consent given by: patient Patient identity confirmed: provided demographic data Prepped and Draped in normal sterile fashion Wound  explored  Laceration Location: Anterior chin  Laceration Length: 2cm  No Foreign Bodies seen or palpated  Anesthesia: local infiltration  Local anesthetic: lidocaine 2% with epinephrine  Anesthetic total: 3 ml  Irrigation method: syringe Amount of cleaning: standard  Skin closure: 6-0 Nylon  Number of sutures: 3  Technique: Simple interrupted  Patient tolerance: Patient tolerated the procedure well with no immediate complications.  MDM   Final diagnoses:  Facial laceration, initial encounter  Fall, initial encounter  Fracture of maxillary alveolar socket wall, closed, initial encounter [facility])   82 year old female presents after a mechanical fall earlier today.    Heart rate: She is tachycardic in triage.  All the vital signs are within normal limits and stable.  5 mg of metoprolol given.  After recheck patient's heart rate is still in the low 100s. 10 mg of metoprolol given.   Facial laceration: Patient has a facial laceration on her chin which was repaired by me.  Patient tolerated procedure well.    CT of the head, neck, face were negative.  Chest x-ray is negative. Patient does have a movable teeth. Recommended dentist follow-up.    All labs are essentially unchanged.  INR is supratherapeutic.  Advised patient not to take her dose tonight to resume tomorrow night.    Pain medication given.  Patient was ambulated which she tolerated well. Patient signed out at shift change to [place], PA-C 11/16/15 [place], DO 11/17/15 1446

## 2015-11-16 NOTE — ED Notes (Signed)
Pt transported to xray 

## 2015-11-16 NOTE — ED Notes (Signed)
Pt verbalized understanding of d/c instructions, prescriptions, and follow-up care. No further questions/concerns, VSS, assisted to lobby in wheelchair.  

## 2015-11-16 NOTE — Discharge Instructions (Signed)
Call Dr. Mariel Sleet for follow up dental visit. He usually sees patient's on Friday. Please let the office knoe that you are on coumadin and have a tooth luxation. Please let the officer know that your coumadin level was high  on 11/16/2015, that you had a dose of vit K and have skipped a dose of coumadin. Ask them if you should hold your coumadin until you see the dentist.  Please have your sutures removed in 1 week.  Laceration Care, Adult A laceration is a cut that goes through all of the layers of the skin and into the tissue that is right under the skin. Some lacerations heal on their own. Others need to be closed with stitches (sutures), staples, skin adhesive strips, or skin glue. Proper laceration care minimizes the risk of infection and helps the laceration to heal better. HOW TO CARE FOR YOUR LACERATION If sutures or staples were used:  Keep the wound clean and dry.  If you were given a bandage (dressing), you should change it at least one time per day or as told by your health care provider. You should also change it if it becomes wet or dirty.  Keep the wound completely dry for the first 24 hours or as told by your health care provider. After that time, you may shower or bathe. However, make sure that the wound is not soaked in water until after the sutures or staples have been removed.  Clean the wound one time each day or as told by your health care provider:  Wash the wound with soap and water.  Rinse the wound with water to remove all soap.  Pat the wound dry with a clean towel. Do not rub the wound.  After cleaning the wound, apply a thin layer of antibiotic ointmentas told by your health care provider. This will help to prevent infection and keep the dressing from sticking to the wound.  Have the sutures or staples removed as told by your health care provider. If skin adhesive strips were used:  Keep the wound clean and dry.  If you were given a bandage (dressing), you  should change it at least one time per day or as told by your health care provider. You should also change it if it becomes dirty or wet.  Do not get the skin adhesive strips wet. You may shower or bathe, but be careful to keep the wound dry.  If the wound gets wet, pat it dry with a clean towel. Do not rub the wound.  Skin adhesive strips fall off on their own. You may trim the strips as the wound heals. Do not remove skin adhesive strips that are still stuck to the wound. They will fall off in time. If skin glue was used:  Try to keep the wound dry, but you may briefly wet it in the shower or bath. Do not soak the wound in water, such as by swimming.  After you have showered or bathed, gently pat the wound dry with a clean towel. Do not rub the wound.  Do not do any activities that will make you sweat heavily until the skin glue has fallen off on its own.  Do not apply liquid, cream, or ointment medicine to the wound while the skin glue is in place. Using those may loosen the film before the wound has healed.  If you were given a bandage (dressing), you should change it at least one time per day or as told by  your health care provider. You should also change it if it becomes dirty or wet.  If a dressing is placed over the wound, be careful not to apply tape directly over the skin glue. Doing that may cause the glue to be pulled off before the wound has healed.  Do not pick at the glue. The skin glue usually remains in place for 5-10 days, then it falls off of the skin. General Instructions  Take over-the-counter and prescription medicines only as told by your health care provider.  If you were prescribed an antibiotic medicine or ointment, take or apply it as told by your doctor. Do not stop using it even if your condition improves.  To help prevent scarring, make sure to cover your wound with sunscreen whenever you are outside after stitches are removed, after adhesive strips are  removed, or when glue remains in place and the wound is healed. Make sure to wear a sunscreen of at least 30 SPF.  Do not scratch or pick at the wound.  Keep all follow-up visits as told by your health care provider. This is important.  Check your wound every day for signs of infection. Watch for:  Redness, swelling, or pain.  Fluid, blood, or pus.  Raise (elevate) the injured area above the level of your heart while you are sitting or lying down, if possible. SEEK MEDICAL CARE IF:  You received a tetanus shot and you have swelling, severe pain, redness, or bleeding at the injection site.  You have a fever.  A wound that was closed breaks open.  You notice a bad smell coming from your wound or your dressing.  You notice something coming out of the wound, such as wood or glass.  Your pain is not controlled with medicine.  You have increased redness, swelling, or pain at the site of your wound.  You have fluid, blood, or pus coming from your wound.  You notice a change in the color of your skin near your wound.  You need to change the dressing frequently due to fluid, blood, or pus draining from the wound.  You develop a new rash.  You develop numbness around the wound. SEEK IMMEDIATE MEDICAL CARE IF:  You develop severe swelling around the wound.  Your pain suddenly increases and is severe.  You develop painful lumps near the wound or on skin that is anywhere on your body.  You have a red streak going away from your wound.  The wound is on your hand or foot and you cannot properly move a finger or toe.  The wound is on your hand or foot and you notice that your fingers or toes look pale or bluish.   This information is not intended to replace advice given to you by your health care provider. Make sure you discuss any questions you have with your health care provider.   Document Released: 06/18/2005 Document Revised: 11/02/2014 Document Reviewed:  06/14/2014 Elsevier Interactive Patient Education 2016 [employer]  Tooth Injuries Tooth injuries (tooth trauma) include cracked or broken teeth (fractures), teeth that have been moved out of place or dislodged (luxations), and knocked-out teeth (avulsions). A tooth injury often needs to be treated quickly to save the tooth. However, sometimes it is not possible to save a tooth after an injury, so the tooth may need to be removed (extracted). CAUSES Tooth injuries may be caused by any force that is strong enough to chip, break, dislodge, or knock out a tooth. Forces  may be due to:  Sports injuries.  Falls.  Accidents.  Fights. RISK FACTORS You may be more likely to injure a tooth if you play a contact sport without using a mouthguard. SYMPTOMS A tooth that is forced into the gum may appear dislodged or moved out of position into the tooth socket. A fractured tooth may not be as obvious. Symptoms of a tooth injury include:  Pain, especially with chewing.  A loose tooth.  Bleeding in or around the tooth.  Swelling or bruising near the tooth.  Swelling or bruising of the lip over the injured tooth.  Increased sensitivity to heat and cold. DIAGNOSIS A tooth injury can be diagnosed with a medical history and a physical exam. You may also need dental X-rays to check for injuries to the root of the tooth. TREATMENT Treatment depends on the type of injury you have and how bad it is. Treatment may need to be done quickly to save your tooth. Possible treatments include:  Replacing a tooth fragment with a filling, cap, or hard, protective cover (crown). This may be an option for a chip or fracture that does not involve the inside of your tooth (pulp).  Having a procedure to repair the inside of the tooth (root canal) and then having a crown placed on top. This may be done to treat a tooth fracture that involves the pulp.  Repositioning a dislodged tooth, then doing a root canal. The  root canal usually needs to be done within a few days of the injury.  Replacing a knocked-out tooth in the socket, if possible, then doing a root canal a few weeks later.  Tooth extraction for a fracture that extends below your gumline or splits your tooth completely. HOME CARE INSTRUCTIONS  Take medicines only as directed by your dental provider or health care provider.  Keep all follow-up visits as directed by your dental provider or health care provider. This is important.  Do not eat or chew on very hard objects. These include ice cubes, pens, pencils, hard candy, and popcorn kernels.  Do not clench or grind your teeth. Tell your dental provider or health care provider if you grind your teeth while you sleep.  Apply ice to your mouth near the injured tooth as directed by your dental provider or health care provider.  Follow instructions about rinsing your mouth with salt water as directed by your dental provider or health care provider.  Do not use your teeth to open packages.  Always wear mouth protection when you play contact sports. SEEK MEDICAL CARE IF:  You continue to have tooth pain after a tooth injury.  Your tooth is sensitive to heat and cold.  You develop swelling near your injured tooth.  You have a fever.  You are unable to open your jaw.  You are drooling and it is getting worse.   This information is not intended to replace advice given to you by your health care provider. Make sure you discuss any questions you have with your health care provider.   Document Released: 03/15/2004 Document Revised: 11/02/2014 Document Reviewed: 06/14/2014 Elsevier Interactive Patient Education 2016 [employer] and Warfarin Warfarin is a medicine that helps prevent harmful blood clots by causing blood to clot more slowly. It does this by decreasing the activity of vitamin K, which promotes normal blood clotting. For the dose of warfarin you have been  prescribed to work well, you need to get about the same amount  of vitamin K from your food from day to day. Suddenly getting a lot more vitamin K could cause your blood to clot too quickly. A sudden decrease in vitamin K intake could cause your blood to clot too slowly. These changes in vitamin K intake could lead to dangerous blood clotsor to bleeding. WHAT GENERAL GUIDELINES DO I NEED TO FOLLOW?  Keep your intake of vitamin K consistent from day to day. To do this, you must be aware of which foods contain moderate or high amounts of vitamin K. Listed below are some foods that are very high, high, or moderately high in vitamin K. If you eat these foods, make sure you eat a consistent amount of them from day to day.  Avoid major changes in your diet, or tell your health care provider before changing your diet.  If you take a multivitamin that contains vitamin K, be sure to take it every day.  If you drink green tea, drink the same amount each day. WHAT FOODS ARE VERY HIGH IN VITAMIN K?   Greens, such as Swiss chard and beet, collard, mustard, or turnip greens (fresh or frozen, cooked).  Kale (fresh or frozen, cooked).   Parsley (raw).  Spinach (cooked).  WHAT FOODS ARE HIGH IN VITAMIN K?  Asparagus (frozen, cooked).  Broccoli.   Bok choy (cooked).   Brussels sprouts (fresh or frozen, cooked).  Cabbage (cooked).  Coleslaw. WHAT FOODS ARE MODERATELY HIGH IN VITAMIN K?  Blueberries.  Black-eyed peas.  Endive (raw).   Green leaf lettuce (raw).   Green scallions (raw).  Kale (raw).  Okra (frozen, cooked).  Plantains (fried).  Romaine lettuce (raw).   Sauerkraut (canned).   Spinach (raw).   This information is not intended to replace advice given to you by your health care provider. Make sure you discuss any questions you have with your health care provider.   Document Released: 04/15/2009 Document Revised: 07/09/2014 Document Reviewed:  04/22/2013 Elsevier Interactive Patient Education [employer].

## 2015-11-16 NOTE — ED Notes (Signed)
Claiborne Billings, PA at bedside to suture pt's chin

## 2015-11-16 NOTE — ED Notes (Addendum)
Per EMS - from parking lot at [facility] to be seen for wound treatment of legs. Pt tripped over curb and landed on face. No LOC. A&O x 4. 2in lac on chin. No pelvic injuries. Denies neck pain. Precautionary c-collar placed. Recently seen for internal GI bleed. Pt takes warfarin for afib.

## 2015-11-18 ENCOUNTER — Ambulatory Visit (INDEPENDENT_AMBULATORY_CARE_PROVIDER_SITE_OTHER): Payer: Commercial Managed Care - HMO | Admitting: Interventional Cardiology

## 2015-11-18 ENCOUNTER — Other Ambulatory Visit: Payer: Self-pay

## 2015-11-18 DIAGNOSIS — E119 Type 2 diabetes mellitus without complications: Secondary | ICD-10-CM | POA: Diagnosis not present

## 2015-11-18 DIAGNOSIS — Z7901 Long term (current) use of anticoagulants: Secondary | ICD-10-CM

## 2015-11-18 DIAGNOSIS — I2510 Atherosclerotic heart disease of native coronary artery without angina pectoris: Secondary | ICD-10-CM | POA: Diagnosis not present

## 2015-11-18 DIAGNOSIS — I10 Essential (primary) hypertension: Secondary | ICD-10-CM | POA: Diagnosis not present

## 2015-11-18 DIAGNOSIS — L97211 Non-pressure chronic ulcer of right calf limited to breakdown of skin: Secondary | ICD-10-CM | POA: Diagnosis not present

## 2015-11-18 DIAGNOSIS — L03116 Cellulitis of left lower limb: Secondary | ICD-10-CM | POA: Diagnosis not present

## 2015-11-18 DIAGNOSIS — E785 Hyperlipidemia, unspecified: Secondary | ICD-10-CM | POA: Diagnosis not present

## 2015-11-18 DIAGNOSIS — L97221 Non-pressure chronic ulcer of left calf limited to breakdown of skin: Secondary | ICD-10-CM | POA: Diagnosis not present

## 2015-11-18 DIAGNOSIS — I480 Paroxysmal atrial fibrillation: Secondary | ICD-10-CM

## 2015-11-18 DIAGNOSIS — I872 Venous insufficiency (chronic) (peripheral): Secondary | ICD-10-CM | POA: Diagnosis not present

## 2015-11-18 LAB — POCT INR: INR: 2.2

## 2015-11-18 NOTE — Patient Outreach (Signed)
[place] [facility]) Care Management  11/18/2015  SHACONNA GRACIAN August 13, 1931 [id number]   Telephone Screen  Referral Date: 11/17/15 Referral Source: [employer] Referral Reason: " disease and symptom management, hx of GI bleed and DM"   Outreach attempt #1 to patient. No answer at present. RN CM left HIPAA compliant voicemail message along with contact info.    Plan:  RN CM will make outreach attempt to patient within a week.   Enzo Montgomery, RN,BSN,CCM [place] Management Telephonic Care Management Coordinator Direct Phone: [phone] Toll Free: [phone] Fax: [phone]

## 2015-11-21 DIAGNOSIS — L97529 Non-pressure chronic ulcer of other part of left foot with unspecified severity: Secondary | ICD-10-CM | POA: Diagnosis not present

## 2015-11-21 DIAGNOSIS — E11621 Type 2 diabetes mellitus with foot ulcer: Secondary | ICD-10-CM | POA: Diagnosis not present

## 2015-11-21 DIAGNOSIS — I872 Venous insufficiency (chronic) (peripheral): Secondary | ICD-10-CM | POA: Diagnosis not present

## 2015-11-21 DIAGNOSIS — K922 Gastrointestinal hemorrhage, unspecified: Secondary | ICD-10-CM | POA: Diagnosis not present

## 2015-11-21 DIAGNOSIS — I5030 Unspecified diastolic (congestive) heart failure: Secondary | ICD-10-CM | POA: Diagnosis not present

## 2015-11-21 DIAGNOSIS — I2510 Atherosclerotic heart disease of native coronary artery without angina pectoris: Secondary | ICD-10-CM | POA: Diagnosis not present

## 2015-11-21 DIAGNOSIS — E1159 Type 2 diabetes mellitus with other circulatory complications: Secondary | ICD-10-CM | POA: Diagnosis not present

## 2015-11-21 DIAGNOSIS — D62 Acute posthemorrhagic anemia: Secondary | ICD-10-CM | POA: Diagnosis not present

## 2015-11-21 DIAGNOSIS — L03031 Cellulitis of right toe: Secondary | ICD-10-CM | POA: Diagnosis not present

## 2015-11-23 ENCOUNTER — Ambulatory Visit: Payer: Self-pay

## 2015-11-23 ENCOUNTER — Other Ambulatory Visit: Payer: Self-pay

## 2015-11-23 NOTE — Patient Outreach (Signed)
[place] [facility]) Care Management  11/23/2015  LAURISSA GOBBI [birth year] [id number]   Telephone Screen  Referral Date: 11/17/15 Referral Source: [employer] Referral Reason: " disease and symptom management, hx of GI bleed and DM"    Outreach attempt #2 to patient. No answer at present. RN CM left HIPAA compliant voicemail message along with contact info.   Plan: RN CM will make outreach attempt to patient within 7-10 business days.   Enzo Montgomery, RN,BSN,CCM [place] Management Telephonic Care Management Coordinator Direct Phone: [phone] Toll Free: [phone] Fax: [phone]

## 2015-11-25 ENCOUNTER — Ambulatory Visit (INDEPENDENT_AMBULATORY_CARE_PROVIDER_SITE_OTHER): Payer: Commercial Managed Care - HMO | Admitting: Cardiology

## 2015-11-25 DIAGNOSIS — L03031 Cellulitis of right toe: Secondary | ICD-10-CM | POA: Diagnosis not present

## 2015-11-25 DIAGNOSIS — I872 Venous insufficiency (chronic) (peripheral): Secondary | ICD-10-CM | POA: Diagnosis not present

## 2015-11-25 DIAGNOSIS — D62 Acute posthemorrhagic anemia: Secondary | ICD-10-CM | POA: Diagnosis not present

## 2015-11-25 DIAGNOSIS — L97529 Non-pressure chronic ulcer of other part of left foot with unspecified severity: Secondary | ICD-10-CM | POA: Diagnosis not present

## 2015-11-25 DIAGNOSIS — K922 Gastrointestinal hemorrhage, unspecified: Secondary | ICD-10-CM | POA: Diagnosis not present

## 2015-11-25 DIAGNOSIS — I2510 Atherosclerotic heart disease of native coronary artery without angina pectoris: Secondary | ICD-10-CM | POA: Diagnosis not present

## 2015-11-25 DIAGNOSIS — Z7901 Long term (current) use of anticoagulants: Secondary | ICD-10-CM

## 2015-11-25 DIAGNOSIS — I480 Paroxysmal atrial fibrillation: Secondary | ICD-10-CM

## 2015-11-25 DIAGNOSIS — E1159 Type 2 diabetes mellitus with other circulatory complications: Secondary | ICD-10-CM | POA: Diagnosis not present

## 2015-11-25 DIAGNOSIS — E11621 Type 2 diabetes mellitus with foot ulcer: Secondary | ICD-10-CM | POA: Diagnosis not present

## 2015-11-25 DIAGNOSIS — I5030 Unspecified diastolic (congestive) heart failure: Secondary | ICD-10-CM | POA: Diagnosis not present

## 2015-11-25 LAB — POCT INR: INR: 3.2

## 2015-11-27 ENCOUNTER — Emergency Department (HOSPITAL_COMMUNITY)
Admission: EM | Admit: 2015-11-27 | Discharge: 2015-11-27 | Disposition: A | Discharge status: Home / Self Care | Payer: Commercial Managed Care - HMO | Attending: Emergency Medicine | Admitting: Emergency Medicine

## 2015-11-27 ENCOUNTER — Encounter (HOSPITAL_COMMUNITY): Payer: Self-pay | Admitting: *Deleted

## 2015-11-27 DIAGNOSIS — Z4802 Encounter for removal of sutures: Secondary | ICD-10-CM | POA: Diagnosis not present

## 2015-11-27 DIAGNOSIS — Z8669 Personal history of other diseases of the nervous system and sense organs: Secondary | ICD-10-CM | POA: Diagnosis not present

## 2015-11-27 DIAGNOSIS — Z7952 Long term (current) use of systemic steroids: Secondary | ICD-10-CM | POA: Insufficient documentation

## 2015-11-27 DIAGNOSIS — Z7984 Long term (current) use of oral hypoglycemic drugs: Secondary | ICD-10-CM | POA: Insufficient documentation

## 2015-11-27 DIAGNOSIS — Z87891 Personal history of nicotine dependence: Secondary | ICD-10-CM | POA: Insufficient documentation

## 2015-11-27 DIAGNOSIS — M1990 Unspecified osteoarthritis, unspecified site: Secondary | ICD-10-CM | POA: Insufficient documentation

## 2015-11-27 DIAGNOSIS — Z7901 Long term (current) use of anticoagulants: Secondary | ICD-10-CM | POA: Insufficient documentation

## 2015-11-27 DIAGNOSIS — Z9861 Coronary angioplasty status: Secondary | ICD-10-CM | POA: Diagnosis not present

## 2015-11-27 DIAGNOSIS — Z8673 Personal history of transient ischemic attack (TIA), and cerebral infarction without residual deficits: Secondary | ICD-10-CM | POA: Diagnosis not present

## 2015-11-27 DIAGNOSIS — E785 Hyperlipidemia, unspecified: Secondary | ICD-10-CM | POA: Diagnosis not present

## 2015-11-27 DIAGNOSIS — Z880 Allergy status to penicillin: Secondary | ICD-10-CM | POA: Insufficient documentation

## 2015-11-27 DIAGNOSIS — I4891 Unspecified atrial fibrillation: Secondary | ICD-10-CM | POA: Diagnosis not present

## 2015-11-27 DIAGNOSIS — I2510 Atherosclerotic heart disease of native coronary artery without angina pectoris: Secondary | ICD-10-CM | POA: Diagnosis not present

## 2015-11-27 DIAGNOSIS — I252 Old myocardial infarction: Secondary | ICD-10-CM | POA: Insufficient documentation

## 2015-11-27 DIAGNOSIS — E119 Type 2 diabetes mellitus without complications: Secondary | ICD-10-CM | POA: Insufficient documentation

## 2015-11-27 DIAGNOSIS — Z79899 Other long term (current) drug therapy: Secondary | ICD-10-CM | POA: Diagnosis not present

## 2015-11-27 NOTE — ED Notes (Signed)
See EDP assessment 

## 2015-11-27 NOTE — Discharge Instructions (Signed)

## 2015-11-27 NOTE — ED Provider Notes (Signed)
CSN: [id number]     Arrival date & time 11/27/15  1948 History   First MD Initiated Contact with Patient 11/27/15 2001     Chief Complaint  Patient presents with  . Suture / Staple Removal     (Consider location/radiation/quality/duration/timing/severity/associated sxs/prior Treatment) HPI   82 year old female presents for suture removal. Patient fell over a curb 11 days ago and suffered a laceration to her chin which was sutured in the ED. Pt report wound is healing fine.  Denies pain or signs of infection.  No other complaints  Past Medical History  Diagnosis Date  . CAD (coronary artery disease)     a. 05/2006 Cath/PCI: LAD 29m, D1 40 ost, LCX nl, OM1 95 (3.0x20 Taxus DES), RCA nl, EF 40% w/ lat AK;  04/2007 Ex MV: minimal lat ischemia in area of prior infarct->low risk-> med Rx.  [name] Hyperlipidemia   . Diabetes mellitus   . MVP (mitral valve prolapse)     a. 10/2003 Echo: nl LV fxn, mild MR/TR/AS  . Dysrhythmia     atrial fib& flutter  . Myocardial infarction ([place])   . Sleep apnea   . Transient ischemic attack   . Arthritis   . Baker's cyst of knee    Past Surgical History  Procedure Laterality Date  . Tonsillectomy    . Coronary angioplasty      Status post PTCA and stenting of the first obtuse marginal-05/13/2006. We placed a 3.0 x 20 mm Taxus stent. It was post dilated using a 3.25 mm noncompliant balloon up to 14 atmospheres  . Cataracts    . Atrial flutter ablation N/A 10/09/2012    Procedure: ATRIAL FLUTTER ABLATION;  Surgeon: Thompson Grayer, MD;  Location: [facility] CATH LAB;  Service: Cardiovascular;  Laterality: N/A;  . Colonoscopy N/A 10/13/2015    Procedure: COLONOSCOPY;  Surgeon: Manus Gunning, MD;  Location: [facility] ENDOSCOPY;  Service: Gastroenterology;  Laterality: N/A;   Family History  Problem Relation Age of Onset  . Heart attack Father   . Heart attack Mother   . Heart attack Brother   . Coronary artery disease Sister     CABG  . Alzheimer's disease Sister      X3  . Stroke Mother   . Stroke Sister   . Heart attack Sister    Social History  Substance Use Topics  . Smoking status: Former Smoker -- 1.00 packs/day    Types: Cigarettes    Quit date: 07/03/1963  . Smokeless tobacco: Never Used  . Alcohol Use: No   OB History    No data available     Review of Systems  Constitutional: Negative for fever.  Skin: Positive for wound.  Neurological: Negative for headaches.      Allergies  Demerol; Erythromycin; Penicillins; and Percocet  Home Medications   Prior to Admission medications   Medication Sig Start Date End Date Taking? Authorizing Provider  amiodarone (PACERONE) 200 MG tablet Take 1 tablet (200 mg total) by mouth daily. 07/22/15   Thayer Headings, MD  atorvastatin (LIPITOR) 20 MG tablet TAKE ONE TABLET BY MOUTH ONE TIME DAILY 04/08/15   Thayer Headings, MD  furosemide (LASIX) 20 MG tablet Take 20 mg by mouth daily. 07/15/15   Historical Provider, MD  HYDROcodone-acetaminophen (NORCO/VICODIN) 5-325 MG tablet Take 0.5-1 tablets by mouth every 4 (four) hours as needed for severe pain. 11/16/15   Margarita Mail, PA-C  hydrocortisone (PROCTO-PAK) 1 % CREA Apply 1 application topically daily. 10/14/15  Donne Hazel, MD  KLOR-CON 10 10 MEQ tablet Take 10 mEq by mouth daily. 07/15/15   Historical Provider, MD  metFORMIN (GLUCOPHAGE) 500 MG tablet Take 500 mg by mouth 2 (two) times daily.  04/06/15   Historical Provider, MD  metoprolol succinate (TOPROL-XL) 50 MG 24 hr tablet Take 25 mg by mouth 2 (two) times daily. Take with or immediately following a meal.    Historical Provider, MD  nitroGLYCERIN (NITROSTAT) 0.4 MG SL tablet Place 0.4 mg under the tongue every 5 (five) minutes x 3 doses as needed for chest pain. Up to 3 doses, if pain continues, call 911. 06/05/11   Thayer Headings, MD  saccharomyces boulardii (FLORASTOR) 250 MG capsule Take 1 capsule (250 mg total) by mouth 2 (two) times daily. 10/14/15   Donne Hazel, MD  warfarin  (COUMADIN) 2.5 MG tablet Take as directed by [facility]. Patient taking differently: Take 1.25-2.5 mg by mouth daily. 1.25mg  on Monday and Thursday and 2.5mg  all other days. Or Take as directed by Coumadin Clinic. 11/04/15   Thayer Headings, MD   BP 98/70 mmHg  Pulse 103  Temp(Src) 97.5 F (36.4 C) (Oral)  Resp 20  Wt 65.318 kg  SpO2 100% Physical Exam  Constitutional: She appears well-developed and well-nourished. No distress.  HENT:  Head: Atraumatic.  Eyes: Conjunctivae are normal.  Neck: Neck supple.  Neurological: She is alert.  Skin: No rash noted.  Chin: 4cm well healing lac with sutures in place.  No signs of infection.  Non tender to palpation.   Psychiatric: She has a normal mood and affect.  Nursing note and vitals reviewed.   ED Course  Procedures (including critical care time)  SUTURE REMOVAL Performed by: Domenic Moras  Consent: Verbal consent obtained. Patient identity confirmed: provided demographic data Time out: Immediately prior to procedure a "time out" was called to verify the correct patient, procedure, equipment, support staff and site/side marked as required.  Location details: chin  Wound Appearance: clean  Sutures/Staples Removed: sutures (3)  Facility: sutures placed in this facility Patient tolerance: Patient tolerated the procedure well with no immediate complications.     MDM   Final diagnoses:  Visit for suture removal    BP 98/70 mmHg  Pulse 103  Temp(Src) 97.5 F (36.4 C) (Oral)  Resp 20  Wt 65.318 kg  SpO2 100%     Domenic Moras, PA-C 11/27/15 2013  Leo Grosser, MD 11/28/15 0157

## 2015-11-27 NOTE — ED Notes (Signed)
Patient here to have 3 sutures removed from her chin  Scabs noted - no redness or swelling noted

## 2015-11-28 DIAGNOSIS — L03031 Cellulitis of right toe: Secondary | ICD-10-CM | POA: Diagnosis not present

## 2015-11-28 DIAGNOSIS — E1159 Type 2 diabetes mellitus with other circulatory complications: Secondary | ICD-10-CM | POA: Diagnosis not present

## 2015-11-28 DIAGNOSIS — K922 Gastrointestinal hemorrhage, unspecified: Secondary | ICD-10-CM | POA: Diagnosis not present

## 2015-11-28 DIAGNOSIS — L97529 Non-pressure chronic ulcer of other part of left foot with unspecified severity: Secondary | ICD-10-CM | POA: Diagnosis not present

## 2015-11-28 DIAGNOSIS — I872 Venous insufficiency (chronic) (peripheral): Secondary | ICD-10-CM | POA: Diagnosis not present

## 2015-11-28 DIAGNOSIS — E11621 Type 2 diabetes mellitus with foot ulcer: Secondary | ICD-10-CM | POA: Diagnosis not present

## 2015-11-28 DIAGNOSIS — D62 Acute posthemorrhagic anemia: Secondary | ICD-10-CM | POA: Diagnosis not present

## 2015-11-28 DIAGNOSIS — I2510 Atherosclerotic heart disease of native coronary artery without angina pectoris: Secondary | ICD-10-CM | POA: Diagnosis not present

## 2015-11-28 DIAGNOSIS — I5030 Unspecified diastolic (congestive) heart failure: Secondary | ICD-10-CM | POA: Diagnosis not present

## 2015-11-29 DIAGNOSIS — I2510 Atherosclerotic heart disease of native coronary artery without angina pectoris: Secondary | ICD-10-CM | POA: Diagnosis not present

## 2015-11-29 DIAGNOSIS — M86672 Other chronic osteomyelitis, left ankle and foot: Secondary | ICD-10-CM | POA: Diagnosis not present

## 2015-11-29 DIAGNOSIS — L97521 Non-pressure chronic ulcer of other part of left foot limited to breakdown of skin: Secondary | ICD-10-CM | POA: Diagnosis not present

## 2015-11-29 DIAGNOSIS — M869 Osteomyelitis, unspecified: Secondary | ICD-10-CM | POA: Diagnosis not present

## 2015-11-29 DIAGNOSIS — I252 Old myocardial infarction: Secondary | ICD-10-CM | POA: Diagnosis not present

## 2015-11-29 DIAGNOSIS — I890 Lymphedema, not elsewhere classified: Secondary | ICD-10-CM | POA: Diagnosis not present

## 2015-11-29 DIAGNOSIS — G4730 Sleep apnea, unspecified: Secondary | ICD-10-CM | POA: Diagnosis not present

## 2015-11-29 DIAGNOSIS — I509 Heart failure, unspecified: Secondary | ICD-10-CM | POA: Diagnosis not present

## 2015-11-29 DIAGNOSIS — E11621 Type 2 diabetes mellitus with foot ulcer: Secondary | ICD-10-CM | POA: Diagnosis not present

## 2015-11-30 ENCOUNTER — Other Ambulatory Visit: Payer: Self-pay

## 2015-11-30 ENCOUNTER — Ambulatory Visit: Payer: Self-pay

## 2015-11-30 NOTE — Patient Outreach (Signed)
[place] [facility]) Care Management  11/30/2015  DOREE LONGMAN 02/10/1932 [id number]   Telephone Screen  Referral Date: 11/17/15 Referral Source: [employer] Referral Reason: " disease and symptom management, hx of GI bleed and DM"   Outreach attempt #3 to patient. No answer. RN CM left HIPAA compliant voicemail message along with contact info.    Plan: RN CM will send unsuccessful outreach letter to patient.  RN CM will close case if no response from patient within 10 business days.    Enzo Montgomery, RN,BSN,CCM [place] Management Telephonic Care Management Coordinator Direct Phone: [phone] Toll Free: [phone] Fax: [phone]

## 2015-12-01 ENCOUNTER — Encounter: Payer: Self-pay | Admitting: Internal Medicine

## 2015-12-01 ENCOUNTER — Ambulatory Visit
Admission: RE | Admit: 2015-12-01 | Discharge: 2015-12-01 | Disposition: A | Discharge status: Home / Self Care | Payer: Commercial Managed Care - HMO | Source: Ambulatory Visit | Attending: Internal Medicine | Admitting: Internal Medicine

## 2015-12-01 ENCOUNTER — Ambulatory Visit (INDEPENDENT_AMBULATORY_CARE_PROVIDER_SITE_OTHER): Payer: Commercial Managed Care - HMO | Admitting: Internal Medicine

## 2015-12-01 VITALS — BP 101/78 | HR 105 | Temp 97.7°F | Wt 149.0 lb

## 2015-12-01 DIAGNOSIS — L97529 Non-pressure chronic ulcer of other part of left foot with unspecified severity: Secondary | ICD-10-CM | POA: Diagnosis not present

## 2015-12-01 DIAGNOSIS — E11621 Type 2 diabetes mellitus with foot ulcer: Secondary | ICD-10-CM

## 2015-12-01 DIAGNOSIS — L97509 Non-pressure chronic ulcer of other part of unspecified foot with unspecified severity: Principal | ICD-10-CM

## 2015-12-01 DIAGNOSIS — E08621 Diabetes mellitus due to underlying condition with foot ulcer: Secondary | ICD-10-CM | POA: Diagnosis not present

## 2015-12-01 LAB — BASIC METABOLIC PANEL
BUN: 19 mg/dL (ref 7–25)
CHLORIDE: 102 mmol/L (ref 98–110)
CO2: 26 mmol/L (ref 20–31)
CREATININE: 0.83 mg/dL (ref 0.60–0.88)
Calcium: 8.4 mg/dL — ABNORMAL LOW (ref 8.6–10.4)
GLUCOSE: 108 mg/dL — AB (ref 65–99)
Potassium: 4 mmol/L (ref 3.5–5.3)
Sodium: 141 mmol/L (ref 135–146)

## 2015-12-01 LAB — SEDIMENTATION RATE: Sed Rate: 8 mm/hr (ref 0–30)

## 2015-12-01 LAB — CBC WITH DIFFERENTIAL/PLATELET
BASOS ABS: 0 {cells}/uL (ref 0–200)
Basophils Relative: 0 %
EOS ABS: 0 {cells}/uL — AB (ref 15–500)
Eosinophils Relative: 0 %
HCT: 33.1 % — ABNORMAL LOW (ref 35.0–45.0)
HEMOGLOBIN: 10.5 g/dL — AB (ref 11.7–15.5)
Lymphocytes Relative: 11 %
Lymphs Abs: 792 cells/uL — ABNORMAL LOW (ref 850–3900)
MCH: 32.2 pg (ref 27.0–33.0)
MCHC: 31.7 g/dL — AB (ref 32.0–36.0)
MCV: 101.5 fL — AB (ref 80.0–100.0)
MPV: 9.4 fL (ref 7.5–12.5)
Monocytes Absolute: 576 cells/uL (ref 200–950)
Monocytes Relative: 8 %
NEUTROS ABS: 5832 {cells}/uL (ref 1500–7800)
NEUTROS PCT: 81 %
PLATELETS: 267 10*3/uL (ref 140–400)
RBC: 3.26 MIL/uL — ABNORMAL LOW (ref 3.80–5.10)
RDW: 16.5 % — ABNORMAL HIGH (ref 11.0–15.0)
WBC: 7.2 10*3/uL (ref 3.8–10.8)

## 2015-12-01 LAB — C-REACTIVE PROTEIN: CRP: 2.2 mg/dL — ABNORMAL HIGH (ref <0.60)

## 2015-12-01 NOTE — Progress Notes (Signed)
Subjective:    Patient ID: Shannon [name], female    DOB: 06-13-1932, 82 y.o.   MRN: [id number]  HPI  82yo F with hx of MVP, CAD c/b chf, afib, DM, chronic venous stasis changes, recent hx of gi bleed in April. She sustained injury to her left great toe in January but has had poorly healing wound since then. When she was admitted for gi bleed in April, she also underwent work up for osteomyelitis. Mri of foot showed t2 changes but no signs c/w osteo. She was started on doxycycline for chronic wound but since then has been following up with dr. Brunetta Jeans at wound care center for management. She has santyl applied to wound, and continues to get her legs wrapped bilaterally, unna boot.  Allergies  Allergen Reactions  . Demerol Other (See Comments)    Burning sensation all over body  . Erythromycin Nausea And Vomiting  . Penicillins Other (See Comments)    unknown  . Percocet [Oxycodone-Acetaminophen] Swelling   Current Outpatient Prescriptions on File Prior to Visit  Medication Sig Dispense Refill  . amiodarone (PACERONE) 200 MG tablet Take 1 tablet (200 mg total) by mouth daily. 120 tablet 6  . atorvastatin (LIPITOR) 20 MG tablet TAKE ONE TABLET BY MOUTH ONE TIME DAILY 90 tablet 3  . furosemide (LASIX) 20 MG tablet Take 20 mg by mouth daily.  0  . HYDROcodone-acetaminophen (NORCO/VICODIN) 5-325 MG tablet Take 0.5-1 tablets by mouth every 4 (four) hours as needed for severe pain. 6 tablet 0  . hydrocortisone (PROCTO-PAK) 1 % CREA Apply 1 application topically daily. 1 Tube 0  . KLOR-CON 10 10 MEQ tablet Take 10 mEq by mouth daily.  2  . metFORMIN (GLUCOPHAGE) 500 MG tablet Take 500 mg by mouth 2 (two) times daily.     . metoprolol succinate (TOPROL-XL) 50 MG 24 hr tablet Take 25 mg by mouth 2 (two) times daily. Take with or immediately following a meal.    . nitroGLYCERIN (NITROSTAT) 0.4 MG SL tablet Place 0.4 mg under the tongue every 5 (five) minutes x 3 doses as needed for chest pain.  Up to 3 doses, if pain continues, call 911.    . saccharomyces boulardii (FLORASTOR) 250 MG capsule Take 1 capsule (250 mg total) by mouth 2 (two) times daily. 30 capsule 0  . warfarin (COUMADIN) 2.5 MG tablet Take as directed by [facility]. (Patient taking differently: Take 1.25-2.5 mg by mouth daily. 1.25mg  on Monday and Thursday and 2.5mg  all other days. Or Take as directed by [facility] Clinic.) 30 tablet 1   No current facility-administered medications on file prior to visit.   Active Ambulatory Problems    Diagnosis Date Noted  . CAD (coronary artery disease)   . Hyperlipidemia   . Diabetes mellitus ([place])   . MVP (mitral valve prolapse)   . Atrial flutter ([place]) 10/09/2012  . UTI (urinary tract infection) 10/09/2012  . Atrial fibrillation ([place]) 01/31/2015  . Edema 01/31/2015  . Acute GI bleeding 10/09/2015  . History of recent fall 10/09/2015  . Chronic venous stasis dermatitis 10/09/2015  . Cellulitis of toe, left great 10/09/2015  . Acute blood loss anemia 10/09/2015  . Foot ulcer, left ([place])   . Shortness of breath 10/12/2015  . Long term (current) use of anticoagulants [Z79.01] 10/24/2015   Resolved Ambulatory Problems    Diagnosis Date Noted  . GI bleed 10/09/2015   Past Medical History  Diagnosis Date  . Diabetes mellitus   .  Dysrhythmia   . Myocardial infarction ([place])   . Sleep apnea   . Transient ischemic attack   . Arthritis   . Baker's cyst of knee    Social History  Substance Use Topics  . Smoking status: Former Smoker -- 1.00 packs/day    Types: Cigarettes    Quit date: 07/03/1963  . Smokeless tobacco: Never Used  . Alcohol Use: No  family history includes Alzheimer's disease in her sister; Coronary artery disease in her sister; Heart attack in her brother, father, mother, and sister; Stroke in her mother and sister.  Review of Systems  Constitutional: Negative for fever, chills, diaphoresis, activity change, appetite change, fatigue and  unexpected weight change.  HENT: Negative for congestion, sore throat, rhinorrhea, sneezing, trouble swallowing and sinus pressure.  Eyes: Negative for photophobia and visual disturbance.  Respiratory: Negative for cough, chest tightness, shortness of breath, wheezing and stridor.  Cardiovascular: Negative for chest pain, palpitations and leg swelling.  Gastrointestinal: Negative for nausea, vomiting, abdominal pain, diarrhea, constipation, blood in stool, abdominal distention and anal bleeding.  Genitourinary: Negative for dysuria, hematuria, flank pain and difficulty urinating.  Musculoskeletal: + lower extremity swelling of legs. Negative for myalgias, back pain, joint swelling, arthralgias and gait problem.  Skin: + chronic left great toe ulcer.Negative for color change, pallor, rash and wound.  Neurological: +lower extremity neuropathy.Negative for dizziness, tremors, weakness and light-headedness.  Hematological: Negative for adenopathy. Does not bruise/bleed easily.  Psychiatric/Behavioral: Negative for behavioral problems, confusion, sleep disturbance, dysphoric mood, decreased concentration and agitation.       Objective:   Physical Exam BP 101/78 mmHg  Pulse 105  Temp(Src) 97.7 F (36.5 C) (Oral)  Wt 149 lb (67.586 kg) Physical Exam  Constitutional:  oriented to person, place, and time. appears well-developed and well-nourished. No distress.  HENT: [place]/AT, PERRLA, no scleral icterus Mouth/Throat: Oropharynx is clear and moist. No oropharyngeal exudate.  Cardiovascular: Normal rate, regular rhythm and normal heart sounds. Exam reveals no gallop and no friction rub.  No murmur heard.  Pulmonary/Chest: Effort normal and breath sounds normal. No respiratory distress.  has no wheezes.  Neck = supple, no nuchal rigidity Abdominal: Soft. Bowel sounds are normal.  exhibits no distension. There is no tenderness.  Lymphadenopathy: no cervical adenopathy. No axillary  adenopathy Neurological: alert and oriented to person, place, and time.  Skin: Skin is warm and dry. No rash noted. No erythema.  Psychiatric: a normal mood and affect.  behavior is normal.    Historic imaging:  Mri reviewed  abi reviewed     Assessment & Plan:  Diabetic foot ulcer of left great toe = concern for chronic osteo. Will get inflammatory Labs and foot Xray since she had MRI 6-7 wk ago to decide whether need to continue with oral abtx +/- surgical referral.  Continue with foot care with santyl

## 2015-12-02 DIAGNOSIS — L03031 Cellulitis of right toe: Secondary | ICD-10-CM | POA: Diagnosis not present

## 2015-12-02 DIAGNOSIS — I2510 Atherosclerotic heart disease of native coronary artery without angina pectoris: Secondary | ICD-10-CM | POA: Diagnosis not present

## 2015-12-02 DIAGNOSIS — K922 Gastrointestinal hemorrhage, unspecified: Secondary | ICD-10-CM | POA: Diagnosis not present

## 2015-12-02 DIAGNOSIS — L97529 Non-pressure chronic ulcer of other part of left foot with unspecified severity: Secondary | ICD-10-CM | POA: Diagnosis not present

## 2015-12-02 DIAGNOSIS — D62 Acute posthemorrhagic anemia: Secondary | ICD-10-CM | POA: Diagnosis not present

## 2015-12-02 DIAGNOSIS — I872 Venous insufficiency (chronic) (peripheral): Secondary | ICD-10-CM | POA: Diagnosis not present

## 2015-12-02 DIAGNOSIS — I5030 Unspecified diastolic (congestive) heart failure: Secondary | ICD-10-CM | POA: Diagnosis not present

## 2015-12-02 DIAGNOSIS — E1159 Type 2 diabetes mellitus with other circulatory complications: Secondary | ICD-10-CM | POA: Diagnosis not present

## 2015-12-02 DIAGNOSIS — E11621 Type 2 diabetes mellitus with foot ulcer: Secondary | ICD-10-CM | POA: Diagnosis not present

## 2015-12-05 ENCOUNTER — Telehealth: Payer: Self-pay | Admitting: Cardiovascular Disease

## 2015-12-05 NOTE — Telephone Encounter (Signed)
New message     FYI Calling to let the pharmacist know that ins will no longer pay for home health to check her PT/INR.  They stopped paying on 11-24-15.  [facility] is not sure who will be checking her PT/INR.

## 2015-12-05 NOTE — Telephone Encounter (Signed)
Spoke with Maudie Mercury Nurse with [facility] and she states they are discharging pt as her insurance is not approving her visits . Maudie Mercury states she is going to the home this afternoon and will instruct pt and her daughter to call our office so we can have her come to our office to have her INR checked also left message on pt's phone to call our office as well

## 2015-12-06 ENCOUNTER — Encounter (HOSPITAL_BASED_OUTPATIENT_CLINIC_OR_DEPARTMENT_OTHER): Payer: Commercial Managed Care - HMO | Attending: Surgery

## 2015-12-06 DIAGNOSIS — L97521 Non-pressure chronic ulcer of other part of left foot limited to breakdown of skin: Secondary | ICD-10-CM | POA: Insufficient documentation

## 2015-12-06 DIAGNOSIS — E11621 Type 2 diabetes mellitus with foot ulcer: Secondary | ICD-10-CM | POA: Diagnosis not present

## 2015-12-06 DIAGNOSIS — L97522 Non-pressure chronic ulcer of other part of left foot with fat layer exposed: Secondary | ICD-10-CM | POA: Diagnosis not present

## 2015-12-06 DIAGNOSIS — Z87891 Personal history of nicotine dependence: Secondary | ICD-10-CM | POA: Insufficient documentation

## 2015-12-06 DIAGNOSIS — I509 Heart failure, unspecified: Secondary | ICD-10-CM | POA: Insufficient documentation

## 2015-12-06 DIAGNOSIS — I252 Old myocardial infarction: Secondary | ICD-10-CM | POA: Insufficient documentation

## 2015-12-06 DIAGNOSIS — G4730 Sleep apnea, unspecified: Secondary | ICD-10-CM | POA: Diagnosis not present

## 2015-12-06 DIAGNOSIS — I890 Lymphedema, not elsewhere classified: Secondary | ICD-10-CM | POA: Diagnosis not present

## 2015-12-06 DIAGNOSIS — E1151 Type 2 diabetes mellitus with diabetic peripheral angiopathy without gangrene: Secondary | ICD-10-CM | POA: Insufficient documentation

## 2015-12-06 DIAGNOSIS — I4891 Unspecified atrial fibrillation: Secondary | ICD-10-CM | POA: Diagnosis not present

## 2015-12-06 NOTE — Telephone Encounter (Signed)
Spoke with pt's daughter scheduled INR check for 12/07/15 at 3:30pm.

## 2015-12-06 NOTE — Telephone Encounter (Signed)
Spoke with Maudie Mercury Nurse with [facility] and she states she did speak with pt and her daughter yesterday  and they stated would call our office today and set up an appt Called and left message on pt's phone and on her daughter's phone to call to set up an appt for an INR check

## 2015-12-06 NOTE — Telephone Encounter (Signed)
Have called again to pt's daughter and left message

## 2015-12-06 NOTE — Telephone Encounter (Signed)
Follow-up      The [facility] nurse is returning the phone from nurse Gatha Mayer [phone] please call daughter

## 2015-12-07 ENCOUNTER — Ambulatory Visit (INDEPENDENT_AMBULATORY_CARE_PROVIDER_SITE_OTHER): Payer: Commercial Managed Care - HMO | Admitting: *Deleted

## 2015-12-07 DIAGNOSIS — E11621 Type 2 diabetes mellitus with foot ulcer: Secondary | ICD-10-CM | POA: Diagnosis not present

## 2015-12-07 DIAGNOSIS — L97521 Non-pressure chronic ulcer of other part of left foot limited to breakdown of skin: Secondary | ICD-10-CM | POA: Diagnosis not present

## 2015-12-07 DIAGNOSIS — E1151 Type 2 diabetes mellitus with diabetic peripheral angiopathy without gangrene: Secondary | ICD-10-CM | POA: Diagnosis not present

## 2015-12-07 DIAGNOSIS — I480 Paroxysmal atrial fibrillation: Secondary | ICD-10-CM | POA: Diagnosis not present

## 2015-12-07 DIAGNOSIS — I252 Old myocardial infarction: Secondary | ICD-10-CM | POA: Diagnosis not present

## 2015-12-07 DIAGNOSIS — I4892 Unspecified atrial flutter: Secondary | ICD-10-CM

## 2015-12-07 DIAGNOSIS — Z7901 Long term (current) use of anticoagulants: Secondary | ICD-10-CM

## 2015-12-07 DIAGNOSIS — I509 Heart failure, unspecified: Secondary | ICD-10-CM | POA: Diagnosis not present

## 2015-12-07 DIAGNOSIS — G4730 Sleep apnea, unspecified: Secondary | ICD-10-CM | POA: Diagnosis not present

## 2015-12-07 DIAGNOSIS — Z87891 Personal history of nicotine dependence: Secondary | ICD-10-CM | POA: Diagnosis not present

## 2015-12-07 DIAGNOSIS — I4891 Unspecified atrial fibrillation: Secondary | ICD-10-CM | POA: Diagnosis not present

## 2015-12-07 LAB — PROTIME-INR
INR: 5.61 — AB (ref 0.00–1.49)
Prothrombin Time: 48.7 seconds — ABNORMAL HIGH (ref 11.6–15.2)

## 2015-12-07 LAB — POCT INR: INR: 6.1

## 2015-12-08 ENCOUNTER — Telehealth: Payer: Self-pay | Admitting: Cardiovascular Disease

## 2015-12-08 NOTE — Telephone Encounter (Signed)
New message     Pt c/o Shortness Of Breath: STAT if SOB developed within the last 24 hours or pt is noticeably SOB on the phone  1. Are you currently SOB (can you hear that pt is SOB on the phone)?  Talking to the daughter   2. How long have you been experiencing SOB?  For approx 3 hrs 3. Are you SOB when sitting or when up moving around?  sitting 4. Are you currently experiencing any other symptoms?  Nausea but no chest pain.  Daughter gave her 2 nitro tablets and 1 metoprolol.  Pulse is between 112-123

## 2015-12-08 NOTE — Telephone Encounter (Signed)
Patient's dtr reports patient not feeling good, nauseous, SOB at rest x 3 hrs, has taken 2 nitro and HR jumping between 112/123.       (ok to speak with dtr per patient) Patient took nitro because she had chest discomfort.  Discussed that chest discomfort is likely secondary to AF episode she is experiencing and that nitro is not going to help with that.  She verbalized understanding stating that it makes sense. Pt reports that she took whole Toprol tablet last night (50 mg total) because of HR. Patient took BP while I was on the phone and reports 120/80 w/ HR 115.  States her avg BPs are around 96/60.  But dtr reports BPs recorded over that last 3 hrs were averaging 120-130s/78/84. Reviewed w/ Dr. Acie Fredrickson who agrees that this is probably r/t AFib episode. Order to take whole Toprol tablet if BPs are stable and call office first thing in the morning if she is still experiencing issues. Patient and dtr and agreeable to plan.

## 2015-12-09 DIAGNOSIS — E1151 Type 2 diabetes mellitus with diabetic peripheral angiopathy without gangrene: Secondary | ICD-10-CM | POA: Diagnosis not present

## 2015-12-09 DIAGNOSIS — R8299 Other abnormal findings in urine: Secondary | ICD-10-CM | POA: Diagnosis not present

## 2015-12-09 DIAGNOSIS — I10 Essential (primary) hypertension: Secondary | ICD-10-CM | POA: Diagnosis not present

## 2015-12-09 DIAGNOSIS — N390 Urinary tract infection, site not specified: Secondary | ICD-10-CM | POA: Diagnosis not present

## 2015-12-09 DIAGNOSIS — M859 Disorder of bone density and structure, unspecified: Secondary | ICD-10-CM | POA: Diagnosis not present

## 2015-12-13 DIAGNOSIS — Z87891 Personal history of nicotine dependence: Secondary | ICD-10-CM | POA: Diagnosis not present

## 2015-12-13 DIAGNOSIS — L97522 Non-pressure chronic ulcer of other part of left foot with fat layer exposed: Secondary | ICD-10-CM | POA: Diagnosis not present

## 2015-12-13 DIAGNOSIS — M86372 Chronic multifocal osteomyelitis, left ankle and foot: Secondary | ICD-10-CM | POA: Diagnosis not present

## 2015-12-13 DIAGNOSIS — I509 Heart failure, unspecified: Secondary | ICD-10-CM | POA: Diagnosis not present

## 2015-12-13 DIAGNOSIS — I4891 Unspecified atrial fibrillation: Secondary | ICD-10-CM | POA: Diagnosis not present

## 2015-12-13 DIAGNOSIS — I252 Old myocardial infarction: Secondary | ICD-10-CM | POA: Diagnosis not present

## 2015-12-13 DIAGNOSIS — G4730 Sleep apnea, unspecified: Secondary | ICD-10-CM | POA: Diagnosis not present

## 2015-12-13 DIAGNOSIS — I890 Lymphedema, not elsewhere classified: Secondary | ICD-10-CM | POA: Diagnosis not present

## 2015-12-13 DIAGNOSIS — L97521 Non-pressure chronic ulcer of other part of left foot limited to breakdown of skin: Secondary | ICD-10-CM | POA: Diagnosis not present

## 2015-12-13 DIAGNOSIS — E1151 Type 2 diabetes mellitus with diabetic peripheral angiopathy without gangrene: Secondary | ICD-10-CM | POA: Diagnosis not present

## 2015-12-13 DIAGNOSIS — E11621 Type 2 diabetes mellitus with foot ulcer: Secondary | ICD-10-CM | POA: Diagnosis not present

## 2015-12-14 ENCOUNTER — Ambulatory Visit (INDEPENDENT_AMBULATORY_CARE_PROVIDER_SITE_OTHER): Payer: Commercial Managed Care - HMO | Admitting: *Deleted

## 2015-12-14 ENCOUNTER — Other Ambulatory Visit: Payer: Self-pay

## 2015-12-14 DIAGNOSIS — I4892 Unspecified atrial flutter: Secondary | ICD-10-CM | POA: Diagnosis not present

## 2015-12-14 DIAGNOSIS — I480 Paroxysmal atrial fibrillation: Secondary | ICD-10-CM

## 2015-12-14 DIAGNOSIS — Z7901 Long term (current) use of anticoagulants: Secondary | ICD-10-CM

## 2015-12-14 LAB — POCT INR: INR: 3.6

## 2015-12-14 NOTE — Patient Outreach (Signed)
[facility]) Care Management  12/14/2015  CHAMPAINE AMSTER 1931-10-08 [id number]   Telephone Screen  Referral Date: 11/17/15 Referral Source: [employer] Referral Reason: " disease and symptom management, hx of GI bleed and DM"    Multiple attempts to establish contact with patient. No response from letter mailed to patient. Case is being closed at this time.   Plan: RN CM will notify [facility] administrative assistant of case closure. RN CM will send MD case closure letter.  Enzo Montgomery, RN,BSN,CCM [place] Management Telephonic Care Management Coordinator Direct Phone: [phone] Toll Free: [phone] Fax: [phone]

## 2015-12-16 DIAGNOSIS — Z7901 Long term (current) use of anticoagulants: Secondary | ICD-10-CM | POA: Diagnosis not present

## 2015-12-16 DIAGNOSIS — D638 Anemia in other chronic diseases classified elsewhere: Secondary | ICD-10-CM | POA: Diagnosis not present

## 2015-12-16 DIAGNOSIS — N183 Chronic kidney disease, stage 3 (moderate): Secondary | ICD-10-CM | POA: Diagnosis not present

## 2015-12-16 DIAGNOSIS — I1310 Hypertensive heart and chronic kidney disease without heart failure, with stage 1 through stage 4 chronic kidney disease, or unspecified chronic kidney disease: Secondary | ICD-10-CM | POA: Diagnosis not present

## 2015-12-16 DIAGNOSIS — M5136 Other intervertebral disc degeneration, lumbar region: Secondary | ICD-10-CM | POA: Diagnosis not present

## 2015-12-16 DIAGNOSIS — I8310 Varicose veins of unspecified lower extremity with inflammation: Secondary | ICD-10-CM | POA: Diagnosis not present

## 2015-12-16 DIAGNOSIS — Z0000 Encounter for general adult medical examination without abnormal findings: Secondary | ICD-10-CM | POA: Diagnosis not present

## 2015-12-16 DIAGNOSIS — D692 Other nonthrombocytopenic purpura: Secondary | ICD-10-CM | POA: Diagnosis not present

## 2015-12-16 DIAGNOSIS — E11621 Type 2 diabetes mellitus with foot ulcer: Secondary | ICD-10-CM | POA: Diagnosis not present

## 2015-12-20 DIAGNOSIS — I4891 Unspecified atrial fibrillation: Secondary | ICD-10-CM | POA: Diagnosis not present

## 2015-12-20 DIAGNOSIS — E11621 Type 2 diabetes mellitus with foot ulcer: Secondary | ICD-10-CM | POA: Diagnosis not present

## 2015-12-20 DIAGNOSIS — L97521 Non-pressure chronic ulcer of other part of left foot limited to breakdown of skin: Secondary | ICD-10-CM | POA: Diagnosis not present

## 2015-12-20 DIAGNOSIS — L97512 Non-pressure chronic ulcer of other part of right foot with fat layer exposed: Secondary | ICD-10-CM | POA: Diagnosis not present

## 2015-12-20 DIAGNOSIS — G4730 Sleep apnea, unspecified: Secondary | ICD-10-CM | POA: Diagnosis not present

## 2015-12-20 DIAGNOSIS — I890 Lymphedema, not elsewhere classified: Secondary | ICD-10-CM | POA: Diagnosis not present

## 2015-12-20 DIAGNOSIS — M86372 Chronic multifocal osteomyelitis, left ankle and foot: Secondary | ICD-10-CM | POA: Diagnosis not present

## 2015-12-20 DIAGNOSIS — Z87891 Personal history of nicotine dependence: Secondary | ICD-10-CM | POA: Diagnosis not present

## 2015-12-20 DIAGNOSIS — E1151 Type 2 diabetes mellitus with diabetic peripheral angiopathy without gangrene: Secondary | ICD-10-CM | POA: Diagnosis not present

## 2015-12-20 DIAGNOSIS — I509 Heart failure, unspecified: Secondary | ICD-10-CM | POA: Diagnosis not present

## 2015-12-20 DIAGNOSIS — I252 Old myocardial infarction: Secondary | ICD-10-CM | POA: Diagnosis not present

## 2015-12-20 DIAGNOSIS — L97522 Non-pressure chronic ulcer of other part of left foot with fat layer exposed: Secondary | ICD-10-CM | POA: Diagnosis not present

## 2015-12-23 ENCOUNTER — Ambulatory Visit (INDEPENDENT_AMBULATORY_CARE_PROVIDER_SITE_OTHER): Payer: Commercial Managed Care - HMO

## 2015-12-23 DIAGNOSIS — Z7901 Long term (current) use of anticoagulants: Secondary | ICD-10-CM

## 2015-12-23 DIAGNOSIS — I480 Paroxysmal atrial fibrillation: Secondary | ICD-10-CM | POA: Diagnosis not present

## 2015-12-23 DIAGNOSIS — I4892 Unspecified atrial flutter: Secondary | ICD-10-CM | POA: Diagnosis not present

## 2015-12-23 LAB — POCT INR: INR: 3.5

## 2015-12-26 ENCOUNTER — Telehealth: Payer: Self-pay | Admitting: Cardiovascular Disease

## 2015-12-26 NOTE — Telephone Encounter (Signed)
New Message:   Pt thinks she is in Congested Heart Failure. She is Short of Breath,Nausea,Headache,Weakness and Coughing.Please call asap.

## 2015-12-26 NOTE — Telephone Encounter (Signed)
Agree with plans to increase her Lasix to see if this will help. We will see her tomorrow in the office.

## 2015-12-26 NOTE — Telephone Encounter (Signed)
Spoke with patient who states she has been coughing x 3 weeks, cannot walk across the room because of fatigue and SOB and is not sleeping well because of difficulty breathing.  She states she has been seen by her PCP and other providers over the past month and has been told her lungs are clear but thinks she may be in congestive heart failure.  She does not monitor daily weight.  I asked her if she has taken any additional lasix and she denies.  She currently takes 20 mg daily.  I advised her to go ahead and take an additional 20 mg now to see if this will help alleviate some of her symptoms.  I scheduled her to see Dr. Acie Fredrickson tomorrow at 3:30.  She verbalized understanding and agreement.

## 2015-12-27 ENCOUNTER — Encounter: Payer: Self-pay | Admitting: Cardiovascular Disease

## 2015-12-27 ENCOUNTER — Ambulatory Visit (INDEPENDENT_AMBULATORY_CARE_PROVIDER_SITE_OTHER): Payer: Commercial Managed Care - HMO | Admitting: Cardiovascular Disease

## 2015-12-27 VITALS — BP 89/68 | HR 100 | Ht 62.0 in

## 2015-12-27 DIAGNOSIS — I2510 Atherosclerotic heart disease of native coronary artery without angina pectoris: Secondary | ICD-10-CM

## 2015-12-27 DIAGNOSIS — R531 Weakness: Secondary | ICD-10-CM | POA: Diagnosis not present

## 2015-12-27 DIAGNOSIS — D500 Iron deficiency anemia secondary to blood loss (chronic): Secondary | ICD-10-CM | POA: Diagnosis not present

## 2015-12-27 DIAGNOSIS — I482 Chronic atrial fibrillation, unspecified: Secondary | ICD-10-CM

## 2015-12-27 LAB — CBC WITH DIFFERENTIAL/PLATELET
BASOS ABS: 0 {cells}/uL (ref 0–200)
Basophils Relative: 0 %
EOS ABS: 0 {cells}/uL — AB (ref 15–500)
Eosinophils Relative: 0 %
HCT: 36.8 % (ref 35.0–45.0)
HEMOGLOBIN: 12 g/dL (ref 11.7–15.5)
Lymphocytes Relative: 7 %
Lymphs Abs: 553 cells/uL — ABNORMAL LOW (ref 850–3900)
MCH: 33.2 pg — AB (ref 27.0–33.0)
MCHC: 32.6 g/dL (ref 32.0–36.0)
MCV: 101.9 fL — AB (ref 80.0–100.0)
MPV: 9.1 fL (ref 7.5–12.5)
Monocytes Absolute: 553 cells/uL (ref 200–950)
Monocytes Relative: 7 %
NEUTROS ABS: 6794 {cells}/uL (ref 1500–7800)
Neutrophils Relative %: 86 %
Platelets: 259 10*3/uL (ref 140–400)
RBC: 3.61 MIL/uL — ABNORMAL LOW (ref 3.80–5.10)
RDW: 17.2 % — ABNORMAL HIGH (ref 11.0–15.0)
WBC: 7.9 10*3/uL (ref 3.8–10.8)

## 2015-12-27 NOTE — Progress Notes (Signed)
Cardiology Office Note   Date:  12/27/2015   ID:  Shannon [name], DOB 1932-05-04, MRN [id number]  PCP:  Haywood Pao, MD  Cardiologist:   Mertie Moores, MD   Chief Complaint  Patient presents with  . Atrial Fibrillation   1. Coronary artery disease: Status post PTCA and stenting of the first obtuse marginal-05/13/2006. We placed a 3.0 x 20 mm Taxus stent. It was post dilated using a 3.25 mm noncompliant balloon up to 14 atmospheres 2. Diabetes mellitus 3. Hyperlipidemia 4. Paroxysmal Atrial fib  History of Present Illness: Shannon [name] is an 82 y.o. female with a hx of CAD. She has some occasional episodes of chest pressure typically when she wakes up in the morning. She also wakes up with a severe headache. She's been worked up for sleep apnea. She works out in the yard on a fairly regular basis and does not have any chest pain or chest tightness with exercise.   She is active all day long. She gets tired but never has any chest pain.  Feb. 12, 2015:  Shannon [name] is doing well. She was in the hosptial recently with Atrial fib and atrial flutter. She had an EP study but was not able to be ablated. She was cardioverted and has maintained NSR. She is able to do all of her normal activities. She is tolerating her pradaxa . Sometimes she forgets  February 15, 2014:  Shannon [name] is doing well. No CP. She had had some bloody mucus and had some sinus problems. Likely due to the pradaxa. No significant arrhthymias    September 20, 2014: Shannon [name] is a 82 y.o. female who presents for follow up of her CAD She is upset about her husband, Katrina [name]. He fell last August and has been bedridden since that time.  Has not been able to get him to his pacer appts.   No CP or dyspnea.  She has occasional nose bleeds  - likely due to the Pradaxa   Oct. 4, 2016: Shannon [name] is seen today for evaluation of rapid atrial fib.  She has paroxysmal atrial fib - has been in atrial fib off  and of  for the past 12 days   Frequently takes extra metoprolol to help keep her  HR slower  Has had excessive fatigue, leg swelling   Oct. 11, 2016 Shannon [name] is back for follow up Was started on amiodarone. She's had some nausea and lack of appetite since starting the amiodarone. She has converted to sinus rhythm. She is feeling better now that she is in normal sinus rhythm. She's had a history of paroxysmal atrial fibrillation. She has seen Dr. Rayann Heman in the past. They discussed A. fib ablation but Dr. Rayann Heman decided that she was not a good candidate for ablation.  Jan. 20, 2017:  Doing ok.  No dizziness, no dyspnea, no CP   Dec 27, 2015:  Complains of being very weak Has had GI bleeding on pradaxa.  Was hospitalized Now is on coumadin  Going to our coumadin clinic  Has developed a cough.   Looks pale today .   Is on Vit D and iron tablets Has not had any recent bleeding .   Has a poor appetite.     Was so weak that she could not weight herself.    Past Medical History  Diagnosis Date  . CAD (coronary artery disease)     a. 05/2006 Cath/PCI: LAD 80m, D1 40 ost, LCX nl, OM1 95 (3.0x20 Taxus  DES), RCA nl, EF 40% w/ lat AK;  04/2007 Ex MV: minimal lat ischemia in area of prior infarct->low risk-> med Rx.  [name] Hyperlipidemia   . Diabetes mellitus   . MVP (mitral valve prolapse)     a. 10/2003 Echo: nl LV fxn, mild MR/TR/AS  . Dysrhythmia     atrial fib& flutter  . Myocardial infarction ([place])   . Sleep apnea   . Transient ischemic attack   . Arthritis   . Baker's cyst of knee     Past Surgical History  Procedure Laterality Date  . Tonsillectomy    . Coronary angioplasty      Status post PTCA and stenting of the first obtuse marginal-05/13/2006. We placed a 3.0 x 20 mm Taxus stent. It was post dilated using a 3.25 mm noncompliant balloon up to 14 atmospheres  . Cataracts    . Atrial flutter ablation N/A 10/09/2012    Procedure: ATRIAL FLUTTER ABLATION;  Surgeon: Thompson Grayer, MD;  Location: [facility] CATH LAB;  Service: Cardiovascular;  Laterality: N/A;  . Colonoscopy N/A 10/13/2015    Procedure: COLONOSCOPY;  Surgeon: Manus Gunning, MD;  Location: [facility] ENDOSCOPY;  Service: Gastroenterology;  Laterality: N/A;     Current Outpatient Prescriptions  Medication Sig Dispense Refill  . ACCU-CHEK SMARTVIEW test strip     . amiodarone (PACERONE) 200 MG tablet Take 1 tablet (200 mg total) by mouth daily. 120 tablet 6  . atorvastatin (LIPITOR) 20 MG tablet TAKE ONE TABLET BY MOUTH ONE TIME DAILY 90 tablet 3  . clindamycin (CLEOCIN) 300 MG capsule Reported on 12/01/2015    . furosemide (LASIX) 20 MG tablet Take 20 mg by mouth daily.  0  . HYDROcodone-acetaminophen (NORCO/VICODIN) 5-325 MG tablet Take 0.5-1 tablets by mouth every 4 (four) hours as needed for severe pain. 6 tablet 0  . hydrocortisone (PROCTO-PAK) 1 % CREA Apply 1 application topically daily. 1 Tube 0  . KLOR-CON 10 10 MEQ tablet Take 10 mEq by mouth daily.  2  . metFORMIN (GLUCOPHAGE) 500 MG tablet Take 500 mg by mouth 2 (two) times daily.     . metoprolol succinate (TOPROL-XL) 50 MG 24 hr tablet Take 25 mg by mouth 2 (two) times daily. Take with or immediately following a meal.    . nitroGLYCERIN (NITROSTAT) 0.4 MG SL tablet Place 0.4 mg under the tongue every 5 (five) minutes x 3 doses as needed for chest pain. Up to 3 doses, if pain continues, call 911.    . saccharomyces boulardii (FLORASTOR) 250 MG capsule Take 1 capsule (250 mg total) by mouth 2 (two) times daily. 30 capsule 0  . SANTYL ointment Apply 1 application topically daily.     [name] warfarin (COUMADIN) 2.5 MG tablet Take as directed by [facility]. 30 tablet 1   No current facility-administered medications for this visit.    Allergies:   Demerol; Erythromycin; Penicillins; and Percocet    Social History:  The patient  reports that she quit smoking about 52 years ago. Her smoking use included Cigarettes. She smoked 1.00 pack per day.  She has never used smokeless tobacco. She reports that she does not drink alcohol or use illicit drugs.   Family History:  The patient's family history includes Alzheimer's disease in her sister; Coronary artery disease in her sister; Heart attack in her brother, father, mother, and sister; Stroke in her mother and sister.    ROS:  Please see the history of present illness.  Review of Systems: Constitutional:  denies fever, chills, diaphoresis, appetite change and fatigue.  HEENT: denies photophobia, eye pain, redness, hearing loss, ear pain, congestion, sore throat, rhinorrhea, sneezing, neck pain, neck stiffness and tinnitus.  Respiratory: denies SOB, DOE, cough, chest tightness, and wheezing.  Cardiovascular: denies chest pain, palpitations and leg swelling.  Gastrointestinal: denies nausea, vomiting, abdominal pain, diarrhea, constipation, blood in stool.  Genitourinary: denies dysuria, urgency, frequency, hematuria, flank pain and difficulty urinating.  Musculoskeletal: denies  myalgias, back pain, joint swelling, arthralgias and gait problem.   Skin: denies pallor, rash and wound.  Neurological: denies dizziness, seizures, syncope, weakness, light-headedness, numbness and headaches.   Hematological: denies adenopathy, easy bruising, personal or family bleeding history.  Psychiatric/ Behavioral: denies suicidal ideation, mood changes, confusion, nervousness, sleep disturbance and agitation.       All other systems are reviewed and negative.    PHYSICAL EXAM: VS:  BP 89/68 mmHg  Pulse 100  Ht 5\' 2"  (1.575 m)  Wt  , BMI There is no weight on file to calculate BMI. GEN:  Chronically ill appearing female  HEENT: normal Neck: no JVD, carotid bruits, or masses Cardiac: Irreg. Irreg. ; no systolic  murmurs, rubs, or gallops, 2+ - 3 +   edema bilaterally .  Respiratory:  clear to auscultation bilaterally, normal work of breathing GI: soft, nontender, nondistended, + BS MS: no  deformity or atrophy Skin: pale  Neuro:  Strength and sensation are intact Psych: normal   EKG:  EKG is ordered today. The ekg ordered today :  Atrial fib with HR os 100.   LAHB  Recent Labs: 01/10/2015: Magnesium 1.9 10/09/2015: TSH 3.274 11/16/2015: ALT 33 12/01/2015: BUN 19; Creat 0.83; Hemoglobin 10.5*; Platelets 267; Potassium 4.0; Sodium 141    Lipid Panel    Component Value Date/Time   CHOL 103* 07/22/2015 0845   TRIG 57 07/22/2015 0845   HDL 64 07/22/2015 0845   CHOLHDL 1.6 07/22/2015 0845   VLDL 11 07/22/2015 0845   LDLCALC 28 07/22/2015 0845      Wt Readings from Last 3 Encounters:  12/01/15 149 lb (67.586 kg)  11/27/15 144 lb (65.318 kg)  10/12/15 172 lb 9.9 oz (78.3 kg)      Other studies Reviewed: Additional studies/ records that were reviewed today include: . Review of the above records demonstrates:    ASSESSMENT AND PLAN:  1. Coronary artery disease: Status post PTCA and stenting of the first obtuse marginal-05/13/2006. We placed a 3.0 x 20 mm Taxus stent. It was post dilated using a 3.25 mm noncompliant balloon up to 14 atmospheres No angina.   2. Diabetes mellitus  - followed by her general medical doctor.  3. Hyperlipidemia- she is followed by Dr. Osborne Casco.     4. Paroxysmal Atrial fib-  She's back in  Atrial fib .  HR is 100.   5. Weakness:   She is very pale .   I suspect she is anemic again .  Will check a CBC today . INR has been supratheraputic.  Coumdin doses have been reduced.  She may need to be admitted for transfusion. At this point I do not have any further suggestions. Her blood pressure is low and I do not think that she can be diuresed as an outpatient. We discussed putting her in the hospital but she does not want to be admitted at this time. She was to see if she can eat better and improve her diet.   I discussed the  case with her daughter. Feeling better in the next several days she'll likely need to go to the hospital.  The  following changes have been made:  no change  Labs/ tests ordered today include:  No orders of the defined types were placed in this encounter.    Disposition:   FU with me in 3 months    Signed, Mertie Moores, MD  12/27/2015 4:20 PM    [facility] [address], [place], [place]  [address] Phone: [phone]; Fax: [phone]

## 2015-12-27 NOTE — Patient Instructions (Signed)
Medication Instructions:  Your physician recommends that you continue on your current medications as directed. Please refer to the Current Medication list given to you today.   Labwork: TODAY - CBC, complete metabolic panel   Testing/Procedures: Your physician has requested that you have an echocardiogram. Echocardiography is a painless test that uses sound waves to create images of your heart. It provides your doctor with information about the size and shape of your heart and how well your heart's chambers and valves are working. This procedure takes approximately one hour. There are no restrictions for this procedure.   Follow-Up: Your physician wants you to follow-up in: 3 months with Dr. Acie Fredrickson.  You will receive a reminder letter in the mail two months in advance. If you don't receive a letter, please call our office to schedule the follow-up appointment.   If you need a refill on your cardiac medications before your next appointment, please call your pharmacy.   Thank you for choosing [facility]! Christen Bame, RN [phone]

## 2015-12-28 DIAGNOSIS — I890 Lymphedema, not elsewhere classified: Secondary | ICD-10-CM | POA: Diagnosis not present

## 2015-12-28 DIAGNOSIS — Z87891 Personal history of nicotine dependence: Secondary | ICD-10-CM | POA: Diagnosis not present

## 2015-12-28 DIAGNOSIS — E1151 Type 2 diabetes mellitus with diabetic peripheral angiopathy without gangrene: Secondary | ICD-10-CM | POA: Diagnosis not present

## 2015-12-28 DIAGNOSIS — I4891 Unspecified atrial fibrillation: Secondary | ICD-10-CM | POA: Diagnosis not present

## 2015-12-28 DIAGNOSIS — G4730 Sleep apnea, unspecified: Secondary | ICD-10-CM | POA: Diagnosis not present

## 2015-12-28 DIAGNOSIS — I509 Heart failure, unspecified: Secondary | ICD-10-CM | POA: Diagnosis not present

## 2015-12-28 DIAGNOSIS — L97512 Non-pressure chronic ulcer of other part of right foot with fat layer exposed: Secondary | ICD-10-CM | POA: Diagnosis not present

## 2015-12-28 DIAGNOSIS — E11621 Type 2 diabetes mellitus with foot ulcer: Secondary | ICD-10-CM | POA: Diagnosis not present

## 2015-12-28 DIAGNOSIS — I252 Old myocardial infarction: Secondary | ICD-10-CM | POA: Diagnosis not present

## 2015-12-28 DIAGNOSIS — L97521 Non-pressure chronic ulcer of other part of left foot limited to breakdown of skin: Secondary | ICD-10-CM | POA: Diagnosis not present

## 2015-12-28 LAB — COMPREHENSIVE METABOLIC PANEL
ALBUMIN: 3.2 g/dL — AB (ref 3.6–5.1)
ALT: 27 U/L (ref 6–29)
AST: 30 U/L (ref 10–35)
Alkaline Phosphatase: 211 U/L — ABNORMAL HIGH (ref 33–130)
BUN: 20 mg/dL (ref 7–25)
CHLORIDE: 99 mmol/L (ref 98–110)
CO2: 26 mmol/L (ref 20–31)
Calcium: 8.7 mg/dL (ref 8.6–10.4)
Creat: 1 mg/dL — ABNORMAL HIGH (ref 0.60–0.88)
Glucose, Bld: 103 mg/dL — ABNORMAL HIGH (ref 65–99)
POTASSIUM: 4.3 mmol/L (ref 3.5–5.3)
Sodium: 138 mmol/L (ref 135–146)
TOTAL PROTEIN: 6 g/dL — AB (ref 6.1–8.1)
Total Bilirubin: 1.1 mg/dL (ref 0.2–1.2)

## 2015-12-29 ENCOUNTER — Telehealth: Payer: Self-pay | Admitting: Cardiovascular Disease

## 2015-12-29 NOTE — Telephone Encounter (Signed)
Pt's daughter requesting to have lab appt rescheduled from 01/16/16 to 01/17/16, the day of the echo appt.  Pt's daughter that pt should only need lipid profile and TSH, other lab ordered for 01/16/16 was done earlier this week or in earlier in June 2017

## 2015-12-29 NOTE — Telephone Encounter (Signed)
New Message:  She wants to know if the pt can get her lab on 7-18,the same day she have her echo?

## 2015-12-29 NOTE — Telephone Encounter (Signed)
Pt's daughter states pt will be fasting for at least 8 hours when she comes for lab and echo 01/17/16

## 2015-12-30 ENCOUNTER — Inpatient Hospital Stay (HOSPITAL_COMMUNITY)
Admission: EM | Admit: 2015-12-30 | Discharge: 2016-01-16 | DRG: 286 | Disposition: A | Discharge status: Skilled Nursing Facility | Payer: Commercial Managed Care - HMO | Attending: Internal Medicine | Admitting: Internal Medicine

## 2015-12-30 ENCOUNTER — Encounter (HOSPITAL_COMMUNITY): Payer: Self-pay | Admitting: *Deleted

## 2015-12-30 ENCOUNTER — Ambulatory Visit (INDEPENDENT_AMBULATORY_CARE_PROVIDER_SITE_OTHER): Payer: Commercial Managed Care - HMO | Admitting: *Deleted

## 2015-12-30 DIAGNOSIS — Z79899 Other long term (current) drug therapy: Secondary | ICD-10-CM

## 2015-12-30 DIAGNOSIS — L97529 Non-pressure chronic ulcer of other part of left foot with unspecified severity: Secondary | ICD-10-CM | POA: Diagnosis present

## 2015-12-30 DIAGNOSIS — I4891 Unspecified atrial fibrillation: Secondary | ICD-10-CM | POA: Diagnosis not present

## 2015-12-30 DIAGNOSIS — I2510 Atherosclerotic heart disease of native coronary artery without angina pectoris: Secondary | ICD-10-CM | POA: Diagnosis not present

## 2015-12-30 DIAGNOSIS — Z955 Presence of coronary angioplasty implant and graft: Secondary | ICD-10-CM | POA: Diagnosis not present

## 2015-12-30 DIAGNOSIS — J189 Pneumonia, unspecified organism: Secondary | ICD-10-CM | POA: Diagnosis not present

## 2015-12-30 DIAGNOSIS — D638 Anemia in other chronic diseases classified elsewhere: Secondary | ICD-10-CM | POA: Diagnosis present

## 2015-12-30 DIAGNOSIS — Z9849 Cataract extraction status, unspecified eye: Secondary | ICD-10-CM

## 2015-12-30 DIAGNOSIS — G4730 Sleep apnea, unspecified: Secondary | ICD-10-CM | POA: Diagnosis present

## 2015-12-30 DIAGNOSIS — Z7189 Other specified counseling: Secondary | ICD-10-CM | POA: Diagnosis not present

## 2015-12-30 DIAGNOSIS — R109 Unspecified abdominal pain: Secondary | ICD-10-CM | POA: Diagnosis not present

## 2015-12-30 DIAGNOSIS — R7989 Other specified abnormal findings of blood chemistry: Secondary | ICD-10-CM | POA: Diagnosis not present

## 2015-12-30 DIAGNOSIS — E1142 Type 2 diabetes mellitus with diabetic polyneuropathy: Secondary | ICD-10-CM | POA: Diagnosis present

## 2015-12-30 DIAGNOSIS — R1312 Dysphagia, oropharyngeal phase: Secondary | ICD-10-CM | POA: Diagnosis not present

## 2015-12-30 DIAGNOSIS — Z8673 Personal history of transient ischemic attack (TIA), and cerebral infarction without residual deficits: Secondary | ICD-10-CM | POA: Diagnosis not present

## 2015-12-30 DIAGNOSIS — Z881 Allergy status to other antibiotic agents status: Secondary | ICD-10-CM

## 2015-12-30 DIAGNOSIS — R41841 Cognitive communication deficit: Secondary | ICD-10-CM | POA: Diagnosis not present

## 2015-12-30 DIAGNOSIS — I482 Chronic atrial fibrillation: Secondary | ICD-10-CM | POA: Diagnosis present

## 2015-12-30 DIAGNOSIS — I959 Hypotension, unspecified: Secondary | ICD-10-CM | POA: Diagnosis not present

## 2015-12-30 DIAGNOSIS — E785 Hyperlipidemia, unspecified: Secondary | ICD-10-CM | POA: Diagnosis present

## 2015-12-30 DIAGNOSIS — D6489 Other specified anemias: Secondary | ICD-10-CM | POA: Diagnosis present

## 2015-12-30 DIAGNOSIS — I4819 Other persistent atrial fibrillation: Secondary | ICD-10-CM | POA: Insufficient documentation

## 2015-12-30 DIAGNOSIS — J9690 Respiratory failure, unspecified, unspecified whether with hypoxia or hypercapnia: Secondary | ICD-10-CM

## 2015-12-30 DIAGNOSIS — T502X5A Adverse effect of carbonic-anhydrase inhibitors, benzothiadiazides and other diuretics, initial encounter: Secondary | ICD-10-CM | POA: Diagnosis present

## 2015-12-30 DIAGNOSIS — E11621 Type 2 diabetes mellitus with foot ulcer: Secondary | ICD-10-CM | POA: Diagnosis not present

## 2015-12-30 DIAGNOSIS — I5021 Acute systolic (congestive) heart failure: Secondary | ICD-10-CM | POA: Diagnosis not present

## 2015-12-30 DIAGNOSIS — Z880 Allergy status to penicillin: Secondary | ICD-10-CM

## 2015-12-30 DIAGNOSIS — R001 Bradycardia, unspecified: Secondary | ICD-10-CM | POA: Diagnosis not present

## 2015-12-30 DIAGNOSIS — I5020 Unspecified systolic (congestive) heart failure: Secondary | ICD-10-CM

## 2015-12-30 DIAGNOSIS — Z885 Allergy status to narcotic agent status: Secondary | ICD-10-CM | POA: Diagnosis not present

## 2015-12-30 DIAGNOSIS — M7989 Other specified soft tissue disorders: Secondary | ICD-10-CM | POA: Insufficient documentation

## 2015-12-30 DIAGNOSIS — I5023 Acute on chronic systolic (congestive) heart failure: Secondary | ICD-10-CM | POA: Diagnosis not present

## 2015-12-30 DIAGNOSIS — I252 Old myocardial infarction: Secondary | ICD-10-CM

## 2015-12-30 DIAGNOSIS — I272 Other secondary pulmonary hypertension: Secondary | ICD-10-CM | POA: Diagnosis present

## 2015-12-30 DIAGNOSIS — M6281 Muscle weakness (generalized): Secondary | ICD-10-CM | POA: Diagnosis not present

## 2015-12-30 DIAGNOSIS — R05 Cough: Secondary | ICD-10-CM | POA: Diagnosis not present

## 2015-12-30 DIAGNOSIS — I248 Other forms of acute ischemic heart disease: Secondary | ICD-10-CM | POA: Diagnosis not present

## 2015-12-30 DIAGNOSIS — L8990 Pressure ulcer of unspecified site, unspecified stage: Secondary | ICD-10-CM | POA: Insufficient documentation

## 2015-12-30 DIAGNOSIS — R0600 Dyspnea, unspecified: Secondary | ICD-10-CM

## 2015-12-30 DIAGNOSIS — Z09 Encounter for follow-up examination after completed treatment for conditions other than malignant neoplasm: Secondary | ICD-10-CM

## 2015-12-30 DIAGNOSIS — J479 Bronchiectasis, uncomplicated: Secondary | ICD-10-CM | POA: Diagnosis present

## 2015-12-30 DIAGNOSIS — R0602 Shortness of breath: Secondary | ICD-10-CM | POA: Diagnosis present

## 2015-12-30 DIAGNOSIS — E876 Hypokalemia: Secondary | ICD-10-CM | POA: Diagnosis not present

## 2015-12-30 DIAGNOSIS — E875 Hyperkalemia: Secondary | ICD-10-CM | POA: Diagnosis not present

## 2015-12-30 DIAGNOSIS — J9601 Acute respiratory failure with hypoxia: Secondary | ICD-10-CM | POA: Diagnosis not present

## 2015-12-30 DIAGNOSIS — I429 Cardiomyopathy, unspecified: Secondary | ICD-10-CM | POA: Diagnosis present

## 2015-12-30 DIAGNOSIS — I4892 Unspecified atrial flutter: Secondary | ICD-10-CM | POA: Diagnosis not present

## 2015-12-30 DIAGNOSIS — Z7901 Long term (current) use of anticoagulants: Secondary | ICD-10-CM

## 2015-12-30 DIAGNOSIS — Z5189 Encounter for other specified aftercare: Secondary | ICD-10-CM | POA: Diagnosis not present

## 2015-12-30 DIAGNOSIS — Z87891 Personal history of nicotine dependence: Secondary | ICD-10-CM | POA: Diagnosis not present

## 2015-12-30 DIAGNOSIS — R188 Other ascites: Secondary | ICD-10-CM | POA: Diagnosis present

## 2015-12-30 DIAGNOSIS — I10 Essential (primary) hypertension: Secondary | ICD-10-CM | POA: Diagnosis not present

## 2015-12-30 DIAGNOSIS — Z7984 Long term (current) use of oral hypoglycemic drugs: Secondary | ICD-10-CM | POA: Diagnosis not present

## 2015-12-30 DIAGNOSIS — G4733 Obstructive sleep apnea (adult) (pediatric): Secondary | ICD-10-CM | POA: Diagnosis present

## 2015-12-30 DIAGNOSIS — E119 Type 2 diabetes mellitus without complications: Secondary | ICD-10-CM | POA: Diagnosis not present

## 2015-12-30 DIAGNOSIS — L03032 Cellulitis of left toe: Secondary | ICD-10-CM

## 2015-12-30 DIAGNOSIS — I480 Paroxysmal atrial fibrillation: Secondary | ICD-10-CM | POA: Diagnosis present

## 2015-12-30 DIAGNOSIS — I5022 Chronic systolic (congestive) heart failure: Secondary | ICD-10-CM

## 2015-12-30 DIAGNOSIS — K8020 Calculus of gallbladder without cholecystitis without obstruction: Secondary | ICD-10-CM | POA: Diagnosis present

## 2015-12-30 DIAGNOSIS — Z8249 Family history of ischemic heart disease and other diseases of the circulatory system: Secondary | ICD-10-CM

## 2015-12-30 DIAGNOSIS — I509 Heart failure, unspecified: Secondary | ICD-10-CM | POA: Diagnosis not present

## 2015-12-30 DIAGNOSIS — Z515 Encounter for palliative care: Secondary | ICD-10-CM | POA: Diagnosis not present

## 2015-12-30 DIAGNOSIS — I481 Persistent atrial fibrillation: Secondary | ICD-10-CM | POA: Diagnosis present

## 2015-12-30 DIAGNOSIS — J90 Pleural effusion, not elsewhere classified: Secondary | ICD-10-CM | POA: Insufficient documentation

## 2015-12-30 DIAGNOSIS — J9621 Acute and chronic respiratory failure with hypoxia: Secondary | ICD-10-CM | POA: Diagnosis not present

## 2015-12-30 DIAGNOSIS — R059 Cough, unspecified: Secondary | ICD-10-CM | POA: Insufficient documentation

## 2015-12-30 DIAGNOSIS — I214 Non-ST elevation (NSTEMI) myocardial infarction: Secondary | ICD-10-CM | POA: Insufficient documentation

## 2015-12-30 DIAGNOSIS — Z452 Encounter for adjustment and management of vascular access device: Secondary | ICD-10-CM | POA: Diagnosis not present

## 2015-12-30 DIAGNOSIS — R778 Other specified abnormalities of plasma proteins: Secondary | ICD-10-CM | POA: Insufficient documentation

## 2015-12-30 HISTORY — DX: Chronic atrial fibrillation, unspecified: I48.20

## 2015-12-30 HISTORY — DX: Chronic systolic (congestive) heart failure: I50.22

## 2015-12-30 LAB — POCT INR: INR: 4

## 2015-12-30 LAB — SAMPLE TO BLOOD BANK

## 2015-12-30 NOTE — ED Notes (Signed)
The pt is c/o abd pain nausea weakness since Tuesday.  The pt had blood work Tuesday and one of her doctors saw her earlier today.  Alert oriented skin warm and dry  She also has a cough

## 2015-12-31 ENCOUNTER — Emergency Department (HOSPITAL_COMMUNITY): Payer: Commercial Managed Care - HMO

## 2015-12-31 ENCOUNTER — Inpatient Hospital Stay (HOSPITAL_COMMUNITY): Payer: Commercial Managed Care - HMO

## 2015-12-31 ENCOUNTER — Encounter (HOSPITAL_COMMUNITY): Payer: Self-pay | Admitting: Internal Medicine

## 2015-12-31 DIAGNOSIS — R0600 Dyspnea, unspecified: Secondary | ICD-10-CM | POA: Diagnosis not present

## 2015-12-31 DIAGNOSIS — K8020 Calculus of gallbladder without cholecystitis without obstruction: Secondary | ICD-10-CM | POA: Diagnosis present

## 2015-12-31 DIAGNOSIS — J9621 Acute and chronic respiratory failure with hypoxia: Secondary | ICD-10-CM | POA: Diagnosis present

## 2015-12-31 DIAGNOSIS — R188 Other ascites: Secondary | ICD-10-CM | POA: Diagnosis present

## 2015-12-31 DIAGNOSIS — J90 Pleural effusion, not elsewhere classified: Secondary | ICD-10-CM | POA: Diagnosis not present

## 2015-12-31 DIAGNOSIS — G4733 Obstructive sleep apnea (adult) (pediatric): Secondary | ICD-10-CM | POA: Diagnosis present

## 2015-12-31 DIAGNOSIS — Z8673 Personal history of transient ischemic attack (TIA), and cerebral infarction without residual deficits: Secondary | ICD-10-CM | POA: Diagnosis not present

## 2015-12-31 DIAGNOSIS — I272 Other secondary pulmonary hypertension: Secondary | ICD-10-CM | POA: Diagnosis present

## 2015-12-31 DIAGNOSIS — I5023 Acute on chronic systolic (congestive) heart failure: Secondary | ICD-10-CM | POA: Diagnosis present

## 2015-12-31 DIAGNOSIS — Z955 Presence of coronary angioplasty implant and graft: Secondary | ICD-10-CM | POA: Diagnosis not present

## 2015-12-31 DIAGNOSIS — I4891 Unspecified atrial fibrillation: Secondary | ICD-10-CM | POA: Diagnosis not present

## 2015-12-31 DIAGNOSIS — G4730 Sleep apnea, unspecified: Secondary | ICD-10-CM | POA: Diagnosis present

## 2015-12-31 DIAGNOSIS — J9601 Acute respiratory failure with hypoxia: Secondary | ICD-10-CM | POA: Diagnosis present

## 2015-12-31 DIAGNOSIS — E11621 Type 2 diabetes mellitus with foot ulcer: Secondary | ICD-10-CM | POA: Diagnosis present

## 2015-12-31 DIAGNOSIS — I429 Cardiomyopathy, unspecified: Secondary | ICD-10-CM | POA: Diagnosis present

## 2015-12-31 DIAGNOSIS — Z9849 Cataract extraction status, unspecified eye: Secondary | ICD-10-CM | POA: Diagnosis not present

## 2015-12-31 DIAGNOSIS — Z7901 Long term (current) use of anticoagulants: Secondary | ICD-10-CM | POA: Diagnosis not present

## 2015-12-31 DIAGNOSIS — Z7984 Long term (current) use of oral hypoglycemic drugs: Secondary | ICD-10-CM | POA: Diagnosis not present

## 2015-12-31 DIAGNOSIS — J479 Bronchiectasis, uncomplicated: Secondary | ICD-10-CM | POA: Diagnosis present

## 2015-12-31 DIAGNOSIS — Z515 Encounter for palliative care: Secondary | ICD-10-CM | POA: Diagnosis not present

## 2015-12-31 DIAGNOSIS — J189 Pneumonia, unspecified organism: Secondary | ICD-10-CM | POA: Diagnosis present

## 2015-12-31 DIAGNOSIS — E785 Hyperlipidemia, unspecified: Secondary | ICD-10-CM | POA: Diagnosis present

## 2015-12-31 DIAGNOSIS — Z8249 Family history of ischemic heart disease and other diseases of the circulatory system: Secondary | ICD-10-CM | POA: Diagnosis not present

## 2015-12-31 DIAGNOSIS — L97529 Non-pressure chronic ulcer of other part of left foot with unspecified severity: Secondary | ICD-10-CM | POA: Diagnosis present

## 2015-12-31 DIAGNOSIS — R109 Unspecified abdominal pain: Secondary | ICD-10-CM

## 2015-12-31 DIAGNOSIS — R001 Bradycardia, unspecified: Secondary | ICD-10-CM | POA: Diagnosis not present

## 2015-12-31 DIAGNOSIS — Z880 Allergy status to penicillin: Secondary | ICD-10-CM | POA: Diagnosis not present

## 2015-12-31 DIAGNOSIS — E1142 Type 2 diabetes mellitus with diabetic polyneuropathy: Secondary | ICD-10-CM | POA: Diagnosis present

## 2015-12-31 DIAGNOSIS — I481 Persistent atrial fibrillation: Secondary | ICD-10-CM | POA: Diagnosis present

## 2015-12-31 DIAGNOSIS — T502X5A Adverse effect of carbonic-anhydrase inhibitors, benzothiadiazides and other diuretics, initial encounter: Secondary | ICD-10-CM | POA: Diagnosis present

## 2015-12-31 DIAGNOSIS — I509 Heart failure, unspecified: Secondary | ICD-10-CM | POA: Diagnosis not present

## 2015-12-31 DIAGNOSIS — E875 Hyperkalemia: Secondary | ICD-10-CM | POA: Diagnosis not present

## 2015-12-31 DIAGNOSIS — Z881 Allergy status to other antibiotic agents status: Secondary | ICD-10-CM | POA: Diagnosis not present

## 2015-12-31 DIAGNOSIS — Z7189 Other specified counseling: Secondary | ICD-10-CM | POA: Diagnosis not present

## 2015-12-31 DIAGNOSIS — D638 Anemia in other chronic diseases classified elsewhere: Secondary | ICD-10-CM | POA: Diagnosis present

## 2015-12-31 DIAGNOSIS — I2510 Atherosclerotic heart disease of native coronary artery without angina pectoris: Secondary | ICD-10-CM | POA: Diagnosis present

## 2015-12-31 DIAGNOSIS — I480 Paroxysmal atrial fibrillation: Secondary | ICD-10-CM | POA: Diagnosis present

## 2015-12-31 DIAGNOSIS — E876 Hypokalemia: Secondary | ICD-10-CM | POA: Diagnosis not present

## 2015-12-31 DIAGNOSIS — I959 Hypotension, unspecified: Secondary | ICD-10-CM | POA: Diagnosis not present

## 2015-12-31 DIAGNOSIS — I5021 Acute systolic (congestive) heart failure: Secondary | ICD-10-CM | POA: Diagnosis not present

## 2015-12-31 DIAGNOSIS — I248 Other forms of acute ischemic heart disease: Secondary | ICD-10-CM | POA: Diagnosis present

## 2015-12-31 DIAGNOSIS — M7989 Other specified soft tissue disorders: Secondary | ICD-10-CM | POA: Diagnosis not present

## 2015-12-31 DIAGNOSIS — I482 Chronic atrial fibrillation: Secondary | ICD-10-CM | POA: Diagnosis present

## 2015-12-31 DIAGNOSIS — Z885 Allergy status to narcotic agent status: Secondary | ICD-10-CM | POA: Diagnosis not present

## 2015-12-31 DIAGNOSIS — R7989 Other specified abnormal findings of blood chemistry: Secondary | ICD-10-CM | POA: Diagnosis not present

## 2015-12-31 DIAGNOSIS — R0602 Shortness of breath: Secondary | ICD-10-CM | POA: Diagnosis present

## 2015-12-31 DIAGNOSIS — Z87891 Personal history of nicotine dependence: Secondary | ICD-10-CM | POA: Diagnosis not present

## 2015-12-31 DIAGNOSIS — Z79899 Other long term (current) drug therapy: Secondary | ICD-10-CM | POA: Diagnosis not present

## 2015-12-31 DIAGNOSIS — I252 Old myocardial infarction: Secondary | ICD-10-CM | POA: Diagnosis not present

## 2015-12-31 DIAGNOSIS — D6489 Other specified anemias: Secondary | ICD-10-CM | POA: Diagnosis present

## 2015-12-31 LAB — CBC WITH DIFFERENTIAL/PLATELET
BASOS ABS: 0 10*3/uL (ref 0.0–0.1)
Basophils Relative: 0 %
EOS PCT: 1 %
Eosinophils Absolute: 0 10*3/uL (ref 0.0–0.7)
HEMATOCRIT: 37 % (ref 36.0–46.0)
Hemoglobin: 11.7 g/dL — ABNORMAL LOW (ref 12.0–15.0)
LYMPHS PCT: 13 %
Lymphs Abs: 1.1 10*3/uL (ref 0.7–4.0)
MCH: 32.9 pg (ref 26.0–34.0)
MCHC: 31.6 g/dL (ref 30.0–36.0)
MCV: 103.9 fL — AB (ref 78.0–100.0)
MONO ABS: 0.7 10*3/uL (ref 0.1–1.0)
Monocytes Relative: 8 %
NEUTROS ABS: 6.3 10*3/uL (ref 1.7–7.7)
Neutrophils Relative %: 78 %
Platelets: 263 10*3/uL (ref 150–400)
RBC: 3.56 MIL/uL — AB (ref 3.87–5.11)
RDW: 17.9 % — ABNORMAL HIGH (ref 11.5–15.5)
WBC: 8.1 10*3/uL (ref 4.0–10.5)

## 2015-12-31 LAB — COMPREHENSIVE METABOLIC PANEL
ALT: 28 U/L (ref 14–54)
AST: 39 U/L (ref 15–41)
Albumin: 2.7 g/dL — ABNORMAL LOW (ref 3.5–5.0)
Alkaline Phosphatase: 202 U/L — ABNORMAL HIGH (ref 38–126)
Anion gap: 10 (ref 5–15)
BUN: 22 mg/dL — ABNORMAL HIGH (ref 6–20)
CHLORIDE: 105 mmol/L (ref 101–111)
CO2: 23 mmol/L (ref 22–32)
Calcium: 8.8 mg/dL — ABNORMAL LOW (ref 8.9–10.3)
Creatinine, Ser: 1.07 mg/dL — ABNORMAL HIGH (ref 0.44–1.00)
GFR, EST AFRICAN AMERICAN: 54 mL/min — AB (ref >60)
GFR, EST NON AFRICAN AMERICAN: 47 mL/min — AB (ref >60)
Glucose, Bld: 143 mg/dL — ABNORMAL HIGH (ref 65–99)
POTASSIUM: 4.3 mmol/L (ref 3.5–5.1)
SODIUM: 138 mmol/L (ref 135–145)
Total Bilirubin: 1.3 mg/dL — ABNORMAL HIGH (ref 0.3–1.2)
Total Protein: 5.9 g/dL — ABNORMAL LOW (ref 6.5–8.1)

## 2015-12-31 LAB — HEPATIC FUNCTION PANEL
ALBUMIN: 2.5 g/dL — AB (ref 3.5–5.0)
ALK PHOS: 197 U/L — AB (ref 38–126)
ALT: 27 U/L (ref 14–54)
AST: 34 U/L (ref 15–41)
BILIRUBIN INDIRECT: 0.6 mg/dL (ref 0.3–0.9)
Bilirubin, Direct: 0.6 mg/dL — ABNORMAL HIGH (ref 0.1–0.5)
TOTAL PROTEIN: 6 g/dL — AB (ref 6.5–8.1)
Total Bilirubin: 1.2 mg/dL (ref 0.3–1.2)

## 2015-12-31 LAB — URINALYSIS, ROUTINE W REFLEX MICROSCOPIC
Bilirubin Urine: NEGATIVE
GLUCOSE, UA: NEGATIVE mg/dL
HGB URINE DIPSTICK: NEGATIVE
Ketones, ur: NEGATIVE mg/dL
Nitrite: NEGATIVE
PH: 5 (ref 5.0–8.0)
Protein, ur: 30 mg/dL — AB
SPECIFIC GRAVITY, URINE: 1.02 (ref 1.005–1.030)

## 2015-12-31 LAB — BASIC METABOLIC PANEL
ANION GAP: 12 (ref 5–15)
BUN: 20 mg/dL (ref 6–20)
CALCIUM: 8.6 mg/dL — AB (ref 8.9–10.3)
CO2: 23 mmol/L (ref 22–32)
Chloride: 101 mmol/L (ref 101–111)
Creatinine, Ser: 0.99 mg/dL (ref 0.44–1.00)
GFR calc Af Amer: 60 mL/min — ABNORMAL LOW (ref >60)
GFR, EST NON AFRICAN AMERICAN: 52 mL/min — AB (ref >60)
GLUCOSE: 138 mg/dL — AB (ref 65–99)
POTASSIUM: 3.5 mmol/L (ref 3.5–5.1)
Sodium: 136 mmol/L (ref 135–145)

## 2015-12-31 LAB — CBC
HEMATOCRIT: 37.4 % (ref 36.0–46.0)
Hemoglobin: 11.5 g/dL — ABNORMAL LOW (ref 12.0–15.0)
MCH: 32.2 pg (ref 26.0–34.0)
MCHC: 30.7 g/dL (ref 30.0–36.0)
MCV: 104.8 fL — AB (ref 78.0–100.0)
Platelets: 273 10*3/uL (ref 150–400)
RBC: 3.57 MIL/uL — AB (ref 3.87–5.11)
RDW: 18 % — ABNORMAL HIGH (ref 11.5–15.5)
WBC: 7.6 10*3/uL (ref 4.0–10.5)

## 2015-12-31 LAB — URINE MICROSCOPIC-ADD ON

## 2015-12-31 LAB — PROTIME-INR
INR: 3.35 — AB (ref 0.00–1.49)
PROTHROMBIN TIME: 33.3 s — AB (ref 11.6–15.2)

## 2015-12-31 LAB — GLUCOSE, CAPILLARY
GLUCOSE-CAPILLARY: 122 mg/dL — AB (ref 65–99)
GLUCOSE-CAPILLARY: 153 mg/dL — AB (ref 65–99)
GLUCOSE-CAPILLARY: 141 mg/dL — AB (ref 65–99)
Glucose-Capillary: 134 mg/dL — ABNORMAL HIGH (ref 65–99)

## 2015-12-31 LAB — TROPONIN I
TROPONIN I: 0.07 ng/mL — AB (ref <0.03)
Troponin I: 0.05 ng/mL (ref <0.03)
Troponin I: 0.06 ng/mL (ref <0.03)
Troponin I: 0.1 ng/mL (ref <0.03)

## 2015-12-31 LAB — LIPASE, BLOOD
LIPASE: 41 U/L (ref 11–51)
LIPASE: 38 U/L (ref 11–51)

## 2015-12-31 LAB — BRAIN NATRIURETIC PEPTIDE: B NATRIURETIC PEPTIDE 5: 593 pg/mL — AB (ref 0.0–100.0)

## 2015-12-31 MED ORDER — INSULIN ASPART 100 UNIT/ML ~~LOC~~ SOLN
0.0000 [IU] | Freq: Three times a day (TID) | SUBCUTANEOUS | Status: DC
  Administered 2015-12-31: 2 [IU] via SUBCUTANEOUS
  Administered 2015-12-31 (×2): 1 [IU] via SUBCUTANEOUS
  Administered 2016-01-01 (×3): 2 [IU] via SUBCUTANEOUS
  Administered 2016-01-02: 3 [IU] via SUBCUTANEOUS
  Administered 2016-01-02 (×2): 1 [IU] via SUBCUTANEOUS
  Administered 2016-01-03 (×2): 2 [IU] via SUBCUTANEOUS
  Administered 2016-01-03 – 2016-01-07 (×9): 1 [IU] via SUBCUTANEOUS
  Administered 2016-01-07: 2 [IU] via SUBCUTANEOUS
  Administered 2016-01-08 – 2016-01-09 (×3): 1 [IU] via SUBCUTANEOUS
  Administered 2016-01-10: 2 [IU] via SUBCUTANEOUS
  Administered 2016-01-10: 1 [IU] via SUBCUTANEOUS

## 2015-12-31 MED ORDER — ONDANSETRON HCL 4 MG/2ML IJ SOLN: 4.0000 mg | Freq: Four times a day (QID) | INTRAMUSCULAR | Status: DC | PRN

## 2015-12-31 MED ORDER — ASPIRIN EC 81 MG PO TBEC
81.0000 mg | DELAYED_RELEASE_TABLET | Freq: Every day | ORAL | Status: DC
  Administered 2015-12-31 – 2016-01-05 (×6): 81 mg via ORAL
  Filled 2015-12-31 (×7): qty 1

## 2015-12-31 MED ORDER — VANCOMYCIN HCL IN DEXTROSE 1-5 GM/200ML-% IV SOLN
1000.0000 mg | INTRAVENOUS | Status: AC
  Administered 2015-12-31: 1000 mg via INTRAVENOUS
  Filled 2015-12-31: qty 200

## 2015-12-31 MED ORDER — HYDROCODONE-ACETAMINOPHEN 5-325 MG PO TABS
0.5000 | ORAL_TABLET | ORAL | Status: DC | PRN
  Administered 2016-01-01: 1 via ORAL
  Administered 2016-01-04: 0.5 via ORAL
  Administered 2016-01-06: 1 via ORAL
  Administered 2016-01-06 – 2016-01-08 (×2): 0.5 via ORAL
  Administered 2016-01-08 – 2016-01-13 (×6): 1 via ORAL
  Administered 2016-01-15 – 2016-01-16 (×2): 0.5 via ORAL
  Filled 2015-12-31 (×13): qty 1

## 2015-12-31 MED ORDER — NITROGLYCERIN 0.4 MG SL SUBL: 0.4000 mg | SUBLINGUAL_TABLET | SUBLINGUAL | Status: DC | PRN

## 2015-12-31 MED ORDER — ONDANSETRON HCL 4 MG PO TABS: 4.0000 mg | ORAL_TABLET | Freq: Four times a day (QID) | ORAL | Status: DC | PRN

## 2015-12-31 MED ORDER — FERROUS SULFATE 325 (65 FE) MG PO TABS
325.0000 mg | ORAL_TABLET | Freq: Every day | ORAL | Status: DC
  Administered 2015-12-31 – 2016-01-16 (×16): 325 mg via ORAL
  Filled 2015-12-31 (×17): qty 1

## 2015-12-31 MED ORDER — AZTREONAM 2 G IJ SOLR: 2.0000 g | Freq: Three times a day (TID) | INTRAMUSCULAR | Status: DC

## 2015-12-31 MED ORDER — SODIUM CHLORIDE 0.9% FLUSH
3.0000 mL | Freq: Two times a day (BID) | INTRAVENOUS | Status: DC
  Administered 2015-12-31 – 2016-01-13 (×12): 3 mL via INTRAVENOUS

## 2015-12-31 MED ORDER — VANCOMYCIN HCL IN DEXTROSE 1-5 GM/200ML-% IV SOLN
1000.0000 mg | INTRAVENOUS | Status: DC
  Administered 2016-01-01 – 2016-01-02 (×2): 1000 mg via INTRAVENOUS
  Filled 2015-12-31 (×2): qty 200

## 2015-12-31 MED ORDER — AZTREONAM 1 G IJ SOLR
1.0000 g | Freq: Three times a day (TID) | INTRAMUSCULAR | Status: DC
Start: 2015-12-31 — End: 2016-01-02
  Administered 2015-12-31 – 2016-01-02 (×6): 1 g via INTRAVENOUS
  Filled 2015-12-31 (×8): qty 1

## 2015-12-31 MED ORDER — DEXTROSE 5 % IV SOLN
1.0000 g | Freq: Once | INTRAVENOUS | Status: AC
  Administered 2015-12-31: 1 g via INTRAVENOUS
  Filled 2015-12-31: qty 1

## 2015-12-31 MED ORDER — FUROSEMIDE 10 MG/ML IJ SOLN
40.0000 mg | Freq: Every day | INTRAMUSCULAR | Status: DC
  Administered 2015-12-31 – 2016-01-01 (×2): 40 mg via INTRAVENOUS
  Filled 2015-12-31: qty 4

## 2015-12-31 MED ORDER — METOPROLOL SUCCINATE ER 25 MG PO TB24
25.0000 mg | ORAL_TABLET | Freq: Two times a day (BID) | ORAL | Status: DC
  Administered 2015-12-31 – 2016-01-02 (×5): 25 mg via ORAL
  Filled 2015-12-31 (×6): qty 1

## 2015-12-31 MED ORDER — DIATRIZOATE MEGLUMINE & SODIUM 66-10 % PO SOLN
ORAL | Status: AC
  Filled 2015-12-31: qty 30

## 2015-12-31 MED ORDER — POTASSIUM CHLORIDE CRYS ER 10 MEQ PO TBCR
10.0000 meq | EXTENDED_RELEASE_TABLET | Freq: Every day | ORAL | Status: DC
  Administered 2015-12-31 – 2016-01-03 (×4): 10 meq via ORAL
  Filled 2015-12-31 (×6): qty 1

## 2015-12-31 MED ORDER — AMIODARONE HCL 200 MG PO TABS
200.0000 mg | ORAL_TABLET | Freq: Every day | ORAL | Status: DC
Start: 2015-12-31 — End: 2016-01-07
  Administered 2015-12-31 – 2016-01-07 (×8): 200 mg via ORAL
  Filled 2015-12-31 (×8): qty 1

## 2015-12-31 MED ORDER — ATORVASTATIN CALCIUM 20 MG PO TABS
20.0000 mg | ORAL_TABLET | Freq: Every day | ORAL | Status: DC
  Administered 2015-12-31 – 2016-01-16 (×17): 20 mg via ORAL
  Filled 2015-12-31 (×17): qty 1

## 2015-12-31 MED ORDER — SACCHAROMYCES BOULARDII 250 MG PO CAPS
250.0000 mg | ORAL_CAPSULE | Freq: Two times a day (BID) | ORAL | Status: DC
  Administered 2015-12-31 – 2016-01-16 (×33): 250 mg via ORAL
  Filled 2015-12-31 (×33): qty 1

## 2015-12-31 NOTE — ED Provider Notes (Signed)
CSN: [id number]     Arrival date & time 12/30/15  2243 History  By signing my name below, I, Irene Pap, attest that this documentation has been prepared under the direction and in the presence of Ripley Fraise, MD. Electronically Signed: Irene Pap, ED Scribe. 12/31/2015. 12:18 AM.  Chief Complaint  Patient presents with  . Abdominal Pain   Patient is a 82 y.o. female presenting with abdominal pain. The history is provided by the patient. No language interpreter was used.  Abdominal Pain Pain location:  Generalized Pain quality: cramping   Pain radiates to:  Does not radiate Pain severity:  Moderate Onset quality:  Gradual Duration:  3 days Timing:  Intermittent Progression:  Worsening Chronicity:  New Worsened by:  Coughing Ineffective treatments:  None tried Associated symptoms: chest pain, cough, nausea and shortness of breath   Associated symptoms: no chills, no dysuria, no fever, no hematuria and no vomiting   Risk factors: being elderly   HPI Comments: Shannon [name] is a 82 y.o. Female with a hx of CAD, anemia, DM, MI, and transient ischemic attack who presents to the Emergency Department complaining of gradually worsening, generalized, cramping abdominal pain onset 3 days ago. Pt reports associated nausea, SOB, productive cough with mucous, chest pain that has since resolved, and generalized weakness. Pt states that the pain feels like "I'm having a baby." Pt states that she is unable to walk any distances without being short of breath. Pt took Nitro last night for her chest pain. Pt is currently denying abdominal pain. Pt is on Warfarin. Pt is seen at the wound center for the swelling and wounds on her legs. Her dressings are changed daily as they weep fluid. Pt had labs performed on Tuesday and was seen by her doctor earlier today. She denies fever, chills, hemoptysis, vomiting, hematochezia, hematemesis, dysuria, or hematuria. Pt is not on oxygen at home.  Past  Medical History  Diagnosis Date  . CAD (coronary artery disease)     a. 05/2006 Cath/PCI: LAD 67m, D1 40 ost, LCX nl, OM1 95 (3.0x20 Taxus DES), RCA nl, EF 40% w/ lat AK;  04/2007 Ex MV: minimal lat ischemia in area of prior infarct->low risk-> med Rx.  [name] Hyperlipidemia   . Diabetes mellitus   . MVP (mitral valve prolapse)     a. 10/2003 Echo: nl LV fxn, mild MR/TR/AS  . Dysrhythmia     atrial fib& flutter  . Myocardial infarction ([place])   . Sleep apnea   . Transient ischemic attack   . Arthritis   . Baker's cyst of knee    Past Surgical History  Procedure Laterality Date  . Tonsillectomy    . Coronary angioplasty      Status post PTCA and stenting of the first obtuse marginal-05/13/2006. We placed a 3.0 x 20 mm Taxus stent. It was post dilated using a 3.25 mm noncompliant balloon up to 14 atmospheres  . Cataracts    . Atrial flutter ablation N/A 10/09/2012    Procedure: ATRIAL FLUTTER ABLATION;  Surgeon: Thompson Grayer, MD;  Location: [facility] CATH LAB;  Service: Cardiovascular;  Laterality: N/A;  . Colonoscopy N/A 10/13/2015    Procedure: COLONOSCOPY;  Surgeon: Manus Gunning, MD;  Location: [facility] ENDOSCOPY;  Service: Gastroenterology;  Laterality: N/A;   Family History  Problem Relation Age of Onset  . Heart attack Father   . Heart attack Mother   . Heart attack Brother   . Coronary artery disease Sister  CABG  . Alzheimer's disease Sister     X3  . Stroke Mother   . Stroke Sister   . Heart attack Sister    Social History  Substance Use Topics  . Smoking status: Former Smoker -- 1.00 packs/day    Types: Cigarettes    Quit date: 07/03/1963  . Smokeless tobacco: Never Used  . Alcohol Use: No   OB History    No data available     Review of Systems  Constitutional: Negative for fever and chills.  Respiratory: Positive for cough and shortness of breath.   Cardiovascular: Positive for chest pain and leg swelling.  Gastrointestinal: Positive for nausea and abdominal  pain. Negative for vomiting and blood in stool.  Genitourinary: Negative for dysuria and hematuria.  Neurological: Positive for weakness.  All other systems reviewed and are negative.  Allergies  Demerol; Erythromycin; Penicillins; and Percocet  Home Medications   Prior to Admission medications   Medication Sig Start Date End Date Taking? Authorizing Provider  ACCU-CHEK SMARTVIEW test strip  09/19/15   Historical Provider, MD  amiodarone (PACERONE) 200 MG tablet Take 1 tablet (200 mg total) by mouth daily. 07/22/15   Thayer Headings, MD  atorvastatin (LIPITOR) 20 MG tablet TAKE ONE TABLET BY MOUTH ONE TIME DAILY 04/08/15   Thayer Headings, MD  clindamycin (CLEOCIN) 300 MG capsule Reported on 12/01/2015 10/05/15   Historical Provider, MD  furosemide (LASIX) 20 MG tablet Take 20 mg by mouth daily. 07/15/15   Historical Provider, MD  HYDROcodone-acetaminophen (NORCO/VICODIN) 5-325 MG tablet Take 0.5-1 tablets by mouth every 4 (four) hours as needed for severe pain. 11/16/15   Margarita Mail, PA-C  hydrocortisone (PROCTO-PAK) 1 % CREA Apply 1 application topically daily. 10/14/15   Donne Hazel, MD  KLOR-CON 10 10 MEQ tablet Take 10 mEq by mouth daily. 07/15/15   Historical Provider, MD  metFORMIN (GLUCOPHAGE) 500 MG tablet Take 500 mg by mouth 2 (two) times daily.  04/06/15   Historical Provider, MD  metoprolol succinate (TOPROL-XL) 50 MG 24 hr tablet Take 25 mg by mouth 2 (two) times daily. Take with or immediately following a meal.    Historical Provider, MD  nitroGLYCERIN (NITROSTAT) 0.4 MG SL tablet Place 0.4 mg under the tongue every 5 (five) minutes x 3 doses as needed for chest pain. Up to 3 doses, if pain continues, call 911. 06/05/11   Thayer Headings, MD  saccharomyces boulardii (FLORASTOR) 250 MG capsule Take 1 capsule (250 mg total) by mouth 2 (two) times daily. 10/14/15   Donne Hazel, MD  SANTYL ointment Apply 1 application topically daily.  10/07/15   Historical Provider, MD  warfarin  (COUMADIN) 2.5 MG tablet Take as directed by [facility]. 11/04/15   Thayer Headings, MD   BP 102/79 mmHg  Pulse 94  Temp(Src) 98 F (36.7 C) (Oral)  Resp 18  Ht 5\' 2"  (1.575 m)  Wt 165 lb 1.6 oz (74.889 kg)  BMI 30.19 kg/m2  SpO2 91% Physical Exam  Nursing note and vitals reviewed. CONSTITUTIONAL: elderly and frail HEAD: Normocephalic/atraumatic EYES: EOMI/PERRL ENMT: Mucous membranes moist NECK: supple no meningeal signs SPINE/BACK:entire spine nontender CV: irregular, no loud murmurs LUNGS: mild tachypnea noted, decreased breath sounds on the right ABDOMEN: soft, nontender, no rebound or guarding, bowel sounds noted throughout abdomen GU:no cva tenderness NEURO: Pt is awake/alert/appropriate, moves all extremitiesx4.  No facial droop.   EXTREMITIES: pulses normal/equal, full ROM; chronic pitting edema, pt with bandages in  place SKIN: warm, color normal PSYCH: no abnormalities of mood noted, alert and oriented to situation  ED Course  Procedures DIAGNOSTIC STUDIES: Oxygen Saturation is 91% on RA, low by my interpretation.    COORDINATION OF CARE: 12:12 AM-Discussed treatment plan which includes labs, EKG, and chest x-ray with pt at bedside and pt agreed to plan.     Pt with progressive sob recently, now with cough and fatigue Probable pneumonia (recent admission in past 3 months, this is HCAP) She also has elevated troponin, though no active CP and no acute EKG changes Will need admission.  I would recommend treating possible pneumonia at this time Pt is not septic in appearance She is stable at this time Will defer cardiology consult until morning 1:37 AM D/w dr Hal Hope for admission  Labs Review Labs Reviewed  COMPREHENSIVE METABOLIC PANEL - Abnormal; Notable for the following:    Glucose, Bld 143 (*)    BUN 22 (*)    Creatinine, Ser 1.07 (*)    Calcium 8.8 (*)    Total Protein 5.9 (*)    Albumin 2.7 (*)    Alkaline Phosphatase 202 (*)    Total  Bilirubin 1.3 (*)    GFR calc non Af Amer 47 (*)    GFR calc Af Amer 54 (*)    All other components within normal limits  CBC - Abnormal; Notable for the following:    RBC 3.57 (*)    Hemoglobin 11.5 (*)    MCV 104.8 (*)    RDW 18.0 (*)    All other components within normal limits  URINALYSIS, ROUTINE W REFLEX MICROSCOPIC (NOT AT [facility]) - Abnormal; Notable for the following:    APPearance CLOUDY (*)    Protein, ur 30 (*)    Leukocytes, UA SMALL (*)    All other components within normal limits  PROTIME-INR - Abnormal; Notable for the following:    Prothrombin Time 33.3 (*)    INR 3.35 (*)    All other components within normal limits  URINE MICROSCOPIC-ADD ON - Abnormal; Notable for the following:    Squamous Epithelial / LPF 0-5 (*)    Bacteria, UA FEW (*)    Casts HYALINE CASTS (*)    Crystals CA OXALATE CRYSTALS (*)    All other components within normal limits  TROPONIN I - Abnormal; Notable for the following:    Troponin I 0.10 (*)    All other components within normal limits  LIPASE, BLOOD  SAMPLE TO BLOOD BANK    Imaging Review Dg Chest 2 View  12/31/2015  CLINICAL DATA:  Dyspnea, cough and weakness for 3 months. EXAM: CHEST  2 VIEW COMPARISON:  11/16/2015 FINDINGS: There is unchanged moderate cardiomegaly and aortic tortuosity. There is focal airspace consolidation in the left base, as well as curvilinear atelectatic appearing opacities in the lateral lungs. Probable small effusions bilaterally. IMPRESSION: Airspace opacities and effusions bilaterally. This may represent pneumonia. Electronically Signed   By: Andreas Newport M.D.   On: 12/31/2015 00:51   I have personally reviewed and evaluated these images and lab results as part of my medical decision-making.   EKG Interpretation   Date/Time:  Saturday December 31 2015 00:23:02 EDT Ventricular Rate:  94 PR Interval:    QRS Duration: 100 QT Interval:  446 QTC Calculation: 558 R Axis:   -89 Text Interpretation:   Atrial fibrillation Left anterior fascicular block  Anteroseptal infarct, age indeterminate Prolonged QT interval Confirmed by  Christy Gentles  MD, Ranferi Clingan (  [id number]) on 12/31/2015 12:56:34 AM     Medications  aztreonam (AZACTAM) 1 g in dextrose 5 % 50 mL IVPB (not administered)  vancomycin (VANCOCIN) IVPB 1000 mg/200 mL premix (not administered)    MDM   Final diagnoses:  HCAP (healthcare-associated pneumonia)  Non-STEMI (non-ST elevated myocardial infarction) [facility])    Nursing notes including past medical history and social history reviewed and considered in documentation. xrays/imaging reviewed by myself and considered during evaluation Labs/vital reviewed myself and considered during evaluation   I personally performed the services described in this documentation, which was scribed in my presence. The recorded information has been reviewed and is accurate.      Ripley Fraise, MD 12/31/15 [phone]

## 2015-12-31 NOTE — ED Notes (Signed)
Placed pt on 2L O2 via [place] d/t low SpO2. MD notified.

## 2015-12-31 NOTE — H&P (Signed)
History and Physical    Shannon [name] [id number] DOB: 09-02-1931 DOA: 12/30/2015  PCP: Haywood Pao, MD  Patient coming from: Home.  Chief Complaint: Abdominal pain and shortness of breath.  HPI: Shannon [name] is a 82 y.o. female with CAD status post stenting, CHF, paroxysmal atrial fibrillation, diabetes mellitus type 2, hyperlipidemia was brought to the ER after patient was complaining of abdominal pain and  shortness of breath. Patient's abdominal pain started last evening which was generalized and by the time patient reached ER pain has largely resolved. Patient has recently followed up with her cardiologist and at that time and complained of weakness poor appetite and as per the patient's daughter lipase was elevated during the lab works done at the cardiologist's office. Patient was found be short of breath and states she has been having some nonproductive cough for last 1 week and also has gained 30 pounds over the last 3 months. Denies any chest pain. Patient takes Lasix 20 mg daily. On exam patient also has bilateral lower extremity edema and wound of the left great toe. Chest x-ray shows features concerning for pneumonia and BNP is elevated. Troponin is mildly elevated. Patient is on Coumadin. Patient at this time will be admitted for acute respiratory failure probably from CHF and pneumonia and abdominal discomfort.   ED Course: Patient was started on empiric antibiotics for pneumonia.  Review of Systems: As per HPI, rest all negative.   Past Medical History  Diagnosis Date  . CAD (coronary artery disease)     a. 05/2006 Cath/PCI: LAD 65m, D1 40 ost, LCX nl, OM1 95 (3.0x20 Taxus DES), RCA nl, EF 40% w/ lat AK;  04/2007 Ex MV: minimal lat ischemia in area of prior infarct->low risk-> med Rx.  [name] Hyperlipidemia   . Diabetes mellitus   . MVP (mitral valve prolapse)     a. 10/2003 Echo: nl LV fxn, mild MR/TR/AS  . Dysrhythmia     atrial fib& flutter  . Myocardial  infarction ([place])   . Sleep apnea   . Transient ischemic attack   . Arthritis   . Baker's cyst of knee     Past Surgical History  Procedure Laterality Date  . Tonsillectomy    . Coronary angioplasty      Status post PTCA and stenting of the first obtuse marginal-05/13/2006. We placed a 3.0 x 20 mm Taxus stent. It was post dilated using a 3.25 mm noncompliant balloon up to 14 atmospheres  . Cataracts    . Atrial flutter ablation N/A 10/09/2012    Procedure: ATRIAL FLUTTER ABLATION;  Surgeon: Thompson Grayer, MD;  Location: [facility] CATH LAB;  Service: Cardiovascular;  Laterality: N/A;  . Colonoscopy N/A 10/13/2015    Procedure: COLONOSCOPY;  Surgeon: Manus Gunning, MD;  Location: [facility] ENDOSCOPY;  Service: Gastroenterology;  Laterality: N/A;     reports that she quit smoking about 52 years ago. Her smoking use included Cigarettes. She smoked 1.00 pack per day. She has never used smokeless tobacco. She reports that she does not drink alcohol or use illicit drugs.  Allergies  Allergen Reactions  . Demerol Other (See Comments)    Burning sensation all over body  . Erythromycin Nausea And Vomiting  . Penicillins Other (See Comments)    unknown  . Percocet [Oxycodone-Acetaminophen] Swelling    Family History  Problem Relation Age of Onset  . Heart attack Father   . Heart attack Mother   . Heart attack Brother   .  Coronary artery disease Sister     CABG  . Alzheimer's disease Sister     X3  . Stroke Mother   . Stroke Sister   . Heart attack Sister     Prior to Admission medications   Medication Sig Start Date End Date Taking? Authorizing Provider  amiodarone (PACERONE) 200 MG tablet Take 1 tablet (200 mg total) by mouth daily. 07/22/15  Yes Thayer Headings, MD  atorvastatin (LIPITOR) 20 MG tablet TAKE ONE TABLET BY MOUTH ONE TIME DAILY 04/08/15  Yes Thayer Headings, MD  Cholecalciferol (VITAMIN D PO) Take 1 tablet by mouth daily.   Yes Historical Provider, MD  Cyanocobalamin  (VITAMIN B-12 PO) Take 1 tablet by mouth daily.   Yes Historical Provider, MD  ferrous sulfate 325 (65 FE) MG tablet Take 325 mg by mouth daily with breakfast.   Yes Historical Provider, MD  furosemide (LASIX) 20 MG tablet Take 20 mg by mouth daily. 07/15/15  Yes Historical Provider, MD  HYDROcodone-acetaminophen (NORCO/VICODIN) 5-325 MG tablet Take 0.5-1 tablets by mouth every 4 (four) hours as needed for severe pain. 11/16/15  Yes Margarita Mail, PA-C  hydrocortisone (PROCTO-PAK) 1 % CREA Apply 1 application topically daily. 10/14/15  Yes Donne Hazel, MD  KLOR-CON 10 10 MEQ tablet Take 10 mEq by mouth daily. 07/15/15  Yes Historical Provider, MD  metFORMIN (GLUCOPHAGE) 500 MG tablet Take 500 mg by mouth 2 (two) times daily.  04/06/15  Yes Historical Provider, MD  metoprolol succinate (TOPROL-XL) 50 MG 24 hr tablet Take 25 mg by mouth 2 (two) times daily. Take with or immediately following a meal.   Yes Historical Provider, MD  nitroGLYCERIN (NITROSTAT) 0.4 MG SL tablet Place 0.4 mg under the tongue every 5 (five) minutes x 3 doses as needed for chest pain. Up to 3 doses, if pain continues, call 911. 06/05/11  Yes Thayer Headings, MD  Pyridoxine HCl (VITAMIN B-6 PO) Take 1 tablet by mouth daily.   Yes Historical Provider, MD  saccharomyces boulardii (FLORASTOR) 250 MG capsule Take 1 capsule (250 mg total) by mouth 2 (two) times daily. 10/14/15  Yes Donne Hazel, MD  SANTYL ointment Apply 1 application topically daily.  10/07/15  Yes Historical Provider, MD  warfarin (COUMADIN) 2.5 MG tablet Take as directed by [facility]. Patient taking differently: Take 1.25 mg by mouth daily.  11/04/15  Yes Thayer Headings, MD  ACCU-CHEK SMARTVIEW test strip  09/19/15   Historical Provider, MD    Physical Exam: Filed Vitals:   12/31/15 0201 12/31/15 0215 12/31/15 0230 12/31/15 0303  BP: 106/78 116/98 114/83 112/81  Pulse: 106 91 105 97  Temp:      TempSrc:      Resp: 19 15 16 18   Height:    5\' 2"  (1.575 m)    Weight:    165 lb (74.844 kg)  SpO2: 97% 92% 98% 93%      Constitutional: Not in distress. Filed Vitals:   12/31/15 0201 12/31/15 0215 12/31/15 0230 12/31/15 0303  BP: 106/78 116/98 114/83 112/81  Pulse: 106 91 105 97  Temp:      TempSrc:      Resp: 19 15 16 18   Height:    5\' 2"  (1.575 m)  Weight:    165 lb (74.844 kg)  SpO2: 97% 92% 98% 93%   Eyes: Anicteric. No pallor. ENMT: No discharge from the ears eyes nose and mouth. Neck: JVD elevated no mass felt. Respiratory: No rhonchi  or crepitations. Cardiovascular: S1 and S2 heard. Abdomen: Mildly distended nontender bowel sounds present. Musculoskeletal: Bilateral lower extremity edema. Skin: Left great toe is dressed up and right great toe shows mild erythema. Neurologic: Alert awake oriented to time place and person. Moves all extremities. Psychiatric: Appears normal.   Labs on Admission: I have personally reviewed following labs and imaging studies  CBC:  Recent Labs Lab 12/27/15 1708 12/30/15 2327  WBC 7.9 7.6  NEUTROABS 6794  --   HGB 12.0 11.5*  HCT 36.8 37.4  MCV 101.9* 104.8*  PLT 259 123456   Basic Metabolic Panel:  Recent Labs Lab 12/27/15 1708 12/30/15 2327  NA 138 138  K 4.3 4.3  CL 99 105  CO2 26 23  GLUCOSE 103* 143*  BUN 20 22*  CREATININE 1.00* 1.07*  CALCIUM 8.7 8.8*   GFR: Estimated Creatinine Clearance: 38.4 mL/min (by C-G formula based on Cr of 1.07). Liver Function Tests:  Recent Labs Lab 12/27/15 1708 12/30/15 2327  AST 30 39  ALT 27 28  ALKPHOS 211* 202*  BILITOT 1.1 1.3*  PROT 6.0* 5.9*  ALBUMIN 3.2* 2.7*    Recent Labs Lab 12/30/15 2327  LIPASE 41   No results for input(s): AMMONIA in the last 168 hours. Coagulation Profile:  Recent Labs Lab 12/30/15 1454 12/30/15 2327  INR 4.0 3.35*   Cardiac Enzymes:  Recent Labs Lab 12/30/15 2346  TROPONINI 0.10*   BNP (last 3 results) No results for input(s): PROBNP in the last 8760 hours. HbA1C: No results  for input(s): HGBA1C in the last 72 hours. CBG: No results for input(s): GLUCAP in the last 168 hours. Lipid Profile: No results for input(s): CHOL, HDL, LDLCALC, TRIG, CHOLHDL, LDLDIRECT in the last 72 hours. Thyroid Function Tests: No results for input(s): TSH, T4TOTAL, FREET4, T3FREE, THYROIDAB in the last 72 hours. Anemia Panel: No results for input(s): VITAMINB12, FOLATE, FERRITIN, TIBC, IRON, RETICCTPCT in the last 72 hours. Urine analysis:    Component Value Date/Time   COLORURINE YELLOW 12/30/2015 2320   APPEARANCEUR CLOUDY* 12/30/2015 2320   LABSPEC 1.020 12/30/2015 2320   PHURINE 5.0 12/30/2015 2320   GLUCOSEU NEGATIVE 12/30/2015 2320   HGBUR NEGATIVE 12/30/2015 2320   BILIRUBINUR NEGATIVE 12/30/2015 2320   KETONESUR NEGATIVE 12/30/2015 2320   PROTEINUR 30* 12/30/2015 2320   UROBILINOGEN 0.2 10/09/2012 1551   NITRITE NEGATIVE 12/30/2015 2320   LEUKOCYTESUR SMALL* 12/30/2015 2320   Sepsis Labs: @LABRCNTIP (procalcitonin:4,lacticidven:4) )No results found for this or any previous visit (from the past 240 hour(s)).   Radiological Exams on Admission: Dg Chest 2 View  12/31/2015  CLINICAL DATA:  Dyspnea, cough and weakness for 3 months. EXAM: CHEST  2 VIEW COMPARISON:  11/16/2015 FINDINGS: There is unchanged moderate cardiomegaly and aortic tortuosity. There is focal airspace consolidation in the left base, as well as curvilinear atelectatic appearing opacities in the lateral lungs. Probable small effusions bilaterally. IMPRESSION: Airspace opacities and effusions bilaterally. This may represent pneumonia. Electronically Signed   By: Andreas Newport M.D.   On: 12/31/2015 00:51    EKG: Independently reviewed. A. fib/atrial flutter rate controlled.  Assessment/Plan Principal Problem:   Acute respiratory failure with hypoxia (HCC) Active Problems:   CAD (coronary artery disease)   Atrial fibrillation (HCC)   HCAP (healthcare-associated pneumonia)   Abdominal pain   CHF  exacerbation (HCC)   Acute respiratory failure ([place])    1. Acute respiratory failure with hypoxia probably from CHF and possible pneumonia - I have placed patient on Lasix  40 mg IV daily check 2-D echo cycle cardiac markers follow daily weights intake output. Patient also has some nonproductive cough for which I have placed patient on empiric antibiotics for healthcare associated pneumonia. Check procalcitonin levels if negative then may discontinue antibiotics. 2. Abdominal discomfort suspect probably from fluid overload. CT abdomen and pelvis is pending. 3. CAD status post stenting - troponin is mildly elevated. For now I have placed patient on heparin infusion and holding of Coumadin for possible procedure is required. Aspirin. Continue statins and beta blockers. Cycle cardiac markers check 2-D echo. 4. Chronic atrial fibrillation - presently rate controlled. Chads 2 vasc score is more than 2. Patient is on Coumadin. Patient used to be on PRADAXA which was discontinued after patient had recent GI bleed. 5. Diabetes mellitus type 2 - will be on sliding scale coverage while inpatient hold metformin for now. 6. Hyperlipidemia on statins. 7. Chronic wound of the left great toe. 8. Chronic anemia - follow CBC.   DVT prophylaxis: Heparin. Code Status: Full code.  Family Communication: Patient's daughter.  Disposition Plan: Home.  Consults called: None.  Admission status: Inpatient. Telemetry. Likely stay 2-3 days.   Rise Patience MD [facility] Pager [phone].  If 7PM-7AM, please contact night-coverage [email or web address] Password [email or web address]  12/31/2015, 4:29 AM

## 2015-12-31 NOTE — Progress Notes (Signed)
ANTIBIOTIC/ANTICOAGULATION CONSULT NOTE - INITIAL  Pharmacy Consult for Vancomycin, Aztreonam, and Heparin when INR <2 Indication: pneumonia and afib  Allergies  Allergen Reactions  . Demerol Other (See Comments)    Burning sensation all over body  . Erythromycin Nausea And Vomiting  . Penicillins Other (See Comments)    unknown  . Percocet [Oxycodone-Acetaminophen] Swelling    Patient Measurements: Height: 5\' 2"  (157.5 cm) Weight: 165 lb (74.844 kg) IBW/kg (Calculated) : 50.1  Heparin dosing wt: 66 kg  Vital Signs: Temp: 98 F (36.7 C) (06/30 2253) Temp Source: Oral (06/30 2253) BP: 112/81 mmHg (07/01 0303) Pulse Rate: 97 (07/01 0303) Intake/Output from previous day:   Intake/Output from this shift:    Labs:  Recent Labs  12/30/15 2327  WBC 7.6  HGB 11.5*  PLT 273  CREATININE 1.07*   Estimated Creatinine Clearance: 38.4 mL/min (by C-G formula based on Cr of 1.07). No results for input(s): VANCOTROUGH, VANCOPEAK, VANCORANDOM, GENTTROUGH, GENTPEAK, GENTRANDOM, TOBRATROUGH, TOBRAPEAK, TOBRARND, AMIKACINPEAK, AMIKACINTROU, AMIKACIN in the last 72 hours.   Microbiology: No results found for this or any previous visit (from the past 720 hour(s)).  Medical History: Past Medical History  Diagnosis Date  . CAD (coronary artery disease)     a. 05/2006 Cath/PCI: LAD 40m, D1 40 ost, LCX nl, OM1 95 (3.0x20 Taxus DES), RCA nl, EF 40% w/ lat AK;  04/2007 Ex MV: minimal lat ischemia in area of prior infarct->low risk-> med Rx.  [name] Hyperlipidemia   . Diabetes mellitus   . MVP (mitral valve prolapse)     a. 10/2003 Echo: nl LV fxn, mild MR/TR/AS  . Dysrhythmia     atrial fib& flutter  . Myocardial infarction ([place])   . Sleep apnea   . Transient ischemic attack   . Arthritis   . Baker's cyst of knee     Medications:  Prescriptions prior to admission  Medication Sig Dispense Refill Last Dose  . amiodarone (PACERONE) 200 MG tablet Take 1 tablet (200 mg total) by mouth  daily. 120 tablet 6 12/30/2015 at Unknown time  . atorvastatin (LIPITOR) 20 MG tablet TAKE ONE TABLET BY MOUTH ONE TIME DAILY 90 tablet 3 12/29/2015 at Unknown time  . Cholecalciferol (VITAMIN D PO) Take 1 tablet by mouth daily.   12/30/2015 at Unknown time  . Cyanocobalamin (VITAMIN B-12 PO) Take 1 tablet by mouth daily.   12/30/2015 at Unknown time  . ferrous sulfate 325 (65 FE) MG tablet Take 325 mg by mouth daily with breakfast.   12/30/2015 at Unknown time  . furosemide (LASIX) 20 MG tablet Take 20 mg by mouth daily.  0 12/30/2015 at Unknown time  . HYDROcodone-acetaminophen (NORCO/VICODIN) 5-325 MG tablet Take 0.5-1 tablets by mouth every 4 (four) hours as needed for severe pain. 6 tablet 0 Past Week at Unknown time  . hydrocortisone (PROCTO-PAK) 1 % CREA Apply 1 application topically daily. 1 Tube 0 12/30/2015 at Unknown time  . KLOR-CON 10 10 MEQ tablet Take 10 mEq by mouth daily.  2 12/30/2015 at Unknown time  . metFORMIN (GLUCOPHAGE) 500 MG tablet Take 500 mg by mouth 2 (two) times daily.    12/30/2015 at Unknown time  . metoprolol succinate (TOPROL-XL) 50 MG 24 hr tablet Take 25 mg by mouth 2 (two) times daily. Take with or immediately following a meal.   12/30/2015 at 0800  . nitroGLYCERIN (NITROSTAT) 0.4 MG SL tablet Place 0.4 mg under the tongue every 5 (five) minutes x 3 doses as needed  for chest pain. Up to 3 doses, if pain continues, call 911.   Past Week at Unknown time  . Pyridoxine HCl (VITAMIN B-6 PO) Take 1 tablet by mouth daily.   12/30/2015 at Unknown time  . saccharomyces boulardii (FLORASTOR) 250 MG capsule Take 1 capsule (250 mg total) by mouth 2 (two) times daily. 30 capsule 0 12/30/2015 at Unknown time  . SANTYL ointment Apply 1 application topically daily.    12/30/2015 at Unknown time  . warfarin (COUMADIN) 2.5 MG tablet Take as directed by [facility]. (Patient taking differently: Take 1.25 mg by mouth daily. ) 30 tablet 1 12/29/2015 at 2300  . ACCU-CHEK SMARTVIEW test strip     Taking   Assessment: 82 y.o. F presents with abd pain.   Anticoagulation: Coumadin PTA for afib. Admission INR 3.35. Plan to hold coumadin and start heparin bridge when INR < 2. Home dose:Coumadin 1.25mg  po daily - last dose 6/29  Infectious Disease: Vanc/Aztreonam x 8 days for PNA.   7/1 Aztreonam>>7/9 7/1 Vanc>>7/9  Goal of Therapy:  Vancomycin trough level 15-20 mcg/ml  INR 2-3 Heparin level 0.3-0.7 units/ml Will monitor platelets according to protocol  Plan:  INR daily - will start heparin when <2 Aztreonam 1gm IV q8h x 8 days Vancomycin 1gm IV q24h x 8 days Will f/u micro data, renal function, and pt's clinical condition Vanc trough prn   Sherlon Handing, PharmD, BCPS Clinical pharmacist, pager [phone] 12/31/2015,4:32 AM

## 2015-12-31 NOTE — ED Notes (Signed)
Patient transported to X-ray 

## 2015-12-31 NOTE — Progress Notes (Addendum)
PROGRESS NOTE    Shannon [name]  [id number] DOB: [birth year] DOA: 12/30/2015 PCP: Haywood Pao, MD   Brief Narrative:  HPI on 12/31/2015 by Dr. Gean Birchwood Shannon [name] is a 82 y.o. female with CAD status post stenting, CHF, paroxysmal atrial fibrillation, diabetes mellitus type 2, hyperlipidemia was brought to the ER after patient was complaining of abdominal pain and shortness of breath. Patient's abdominal pain started last evening which was generalized and by the time patient reached ER pain has largely resolved. Patient has recently followed up with her cardiologist and at that time and complained of weakness poor appetite and as per the patient's daughter lipase was elevated during the lab works done at the cardiologist's office. Patient was found be short of breath and states she has been having some nonproductive cough for last 1 week and also has gained 30 pounds over the last 3 months. Denies any chest pain. Patient takes Lasix 20 mg daily. On exam patient also has bilateral lower extremity edema and wound of the left great toe. Chest x-ray shows features concerning for pneumonia and BNP is elevated. Troponin is mildly elevated. Patient is on Coumadin. Patient at this time will be admitted for acute respiratory failure probably from CHF and pneumonia and abdominal discomfort.   Assessment & Plan   Acute respiratory failure with hypoxia -Likely multi-factorial including heart failure and possible pneumonia] -Upon admission to the ED, SPO2 87%, continue oxygen supplementation -BNP 593 -Chest x-ray: Airspace opacities with effusions bilaterally. -Echocardiogram pending -Monitor intake and output, daily weights -Continue empiric antibiotics -Continue vancomycin and aztreonam -Echocardiogram 04/07/2015 showed an EF of 50-55% -Monitor intake and output, daily weights  Abdominal pain -CT abdomen and pelvis: small to moderate volume ascites, cholelithiasis, negative  for bowel obstruction, perforation or ambulatory change. Bilateral pleural effusions, right greater than left. -Continue to monitor, pain control  Coronary artery disease -Denies chest pain -Troponin mildly elevated but trending downward (0.1, 0.07, 0.06) -Continue aspirin, statin, metoprolol  Atrial fibrillation, chronic -CHADSVASC -Coumadin held, currently on heparin  Diabetes mellitus, type II -Metformin held, continued findings for CBG monitoring  Hyperlipidemia -Continue statin  Chronic Anemia -Continue iron supplementation -Baseline hemoglobin approximately 10, currently 11.7  DVT Prophylaxis  heparin  Code Status: Full  Family Communication: None at bedside  Disposition Plan: Admitted.   Consultants None  Procedures  None  Antibiotics   Anti-infectives    Start     Dose/Rate Route Frequency Ordered Stop   01/01/16 0500  vancomycin (VANCOCIN) IVPB 1000 mg/200 mL premix     1,000 mg 200 mL/hr over 60 Minutes Intravenous Every 24 hours 12/31/15 0440 01/08/16 0459   12/31/15 1400  aztreonam (AZACTAM) 1 g in dextrose 5 % 50 mL IVPB     1 g 100 mL/hr over 30 Minutes Intravenous Every 8 hours 12/31/15 0440 01/08/16 1359   12/31/15 0600  aztreonam (AZACTAM) 2 g in dextrose 5 % 50 mL IVPB  Status:  Discontinued     2 g 100 mL/hr over 30 Minutes Intravenous Every 8 hours 12/31/15 0428 12/31/15 0435   12/31/15 0130  aztreonam (AZACTAM) 1 g in dextrose 5 % 50 mL IVPB     1 g 100 mL/hr over 30 Minutes Intravenous  Once 12/31/15 0121 12/31/15 0338   12/31/15 0130  vancomycin (VANCOCIN) IVPB 1000 mg/200 mL premix     1,000 mg 200 mL/hr over 60 Minutes Intravenous NOW 12/31/15 0122 12/31/15 0247      Subjective:  Minde Gash seen and examined today.  Patient feels her breathing has mildly improved. Has mild cough. Denies any chest pain, abdominal pain at this time. Denies nausea or vomiting, dizziness or headache.  Objective:   Filed Vitals:   12/31/15  0215 12/31/15 0230 12/31/15 0303 12/31/15 0904  BP: 116/98 114/83 112/81 102/62  Pulse: 91 105 97 105  Temp:      TempSrc:      Resp: 15 16 18    Height:   5\' 2"  (1.575 m)   Weight:   74.844 kg (165 lb)   SpO2: 92% 98% 93%    No intake or output data in the 24 hours ending 12/31/15 1309 Filed Weights   12/30/15 2253 12/31/15 0303  Weight: 74.889 kg (165 lb 1.6 oz) 74.844 kg (165 lb)    Exam  General: Well developed, well nourished, NAD, appears stated age  HEENT: NCAT, mucous membranes moist.   Neck: Supple, + JVD, no masses  Cardiovascular: S1 S2 auscultated, Irregular  Respiratory: Diminshed but clear  Abdomen: Soft, nontender, nondistended, + bowel sounds  Extremities: warm dry without cyanosis clubbing. LE Edema. Dressing on left great toe.  Neuro: AAOx3, nonfocal  Psych: Normal affect and demeanor   Data Reviewed: I have personally reviewed following labs and imaging studies  CBC:  Recent Labs Lab 12/27/15 1708 12/30/15 2327 12/31/15 0525  WBC 7.9 7.6 8.1  NEUTROABS 6794  --  6.3  HGB 12.0 11.5* 11.7*  HCT 36.8 37.4 37.0  MCV 101.9* 104.8* 103.9*  PLT 259 273 [id number]   Basic Metabolic Panel:  Recent Labs Lab 12/27/15 1708 12/30/15 2327 12/31/15 0525  NA 138 138 136  K 4.3 4.3 3.5  CL 99 105 101  CO2 26 23 23   GLUCOSE 103* 143* 138*  BUN 20 22* 20  CREATININE 1.00* 1.07* 0.99  CALCIUM 8.7 8.8* 8.6*   GFR: Estimated Creatinine Clearance: 41.5 mL/min (by C-G formula based on Cr of 0.99). Liver Function Tests:  Recent Labs Lab 12/27/15 1708 12/30/15 2327 12/31/15 0525  AST 30 39 34  ALT 27 28 27   ALKPHOS 211* 202* 197*  BILITOT 1.1 1.3* 1.2  PROT 6.0* 5.9* 6.0*  ALBUMIN 3.2* 2.7* 2.5*    Recent Labs Lab 12/30/15 2327 12/31/15 0525  LIPASE 41 38   No results for input(s): AMMONIA in the last 168 hours. Coagulation Profile:  Recent Labs Lab 12/30/15 1454 12/30/15 2327  INR 4.0 3.35*   Cardiac Enzymes:  Recent Labs Lab  12/30/15 2346 12/31/15 0525 12/31/15 1022  TROPONINI 0.10* 0.07* 0.06*   BNP (last 3 results) No results for input(s): PROBNP in the last 8760 hours. HbA1C: No results for input(s): HGBA1C in the last 72 hours. CBG:  Recent Labs Lab 12/31/15 0546 12/31/15 1103  GLUCAP 122* 153*   Lipid Profile: No results for input(s): CHOL, HDL, LDLCALC, TRIG, CHOLHDL, LDLDIRECT in the last 72 hours. Thyroid Function Tests: No results for input(s): TSH, T4TOTAL, FREET4, T3FREE, THYROIDAB in the last 72 hours. Anemia Panel: No results for input(s): VITAMINB12, FOLATE, FERRITIN, TIBC, IRON, RETICCTPCT in the last 72 hours. Urine analysis:    Component Value Date/Time   COLORURINE YELLOW 12/30/2015 2320   APPEARANCEUR CLOUDY* 12/30/2015 2320   LABSPEC 1.020 12/30/2015 2320   PHURINE 5.0 12/30/2015 2320   GLUCOSEU NEGATIVE 12/30/2015 2320   HGBUR NEGATIVE 12/30/2015 2320   BILIRUBINUR NEGATIVE 12/30/2015 2320   KETONESUR NEGATIVE 12/30/2015 2320   PROTEINUR 30* 12/30/2015 2320   UROBILINOGEN 0.2 10/09/2012  [place] 12/30/2015 2320   LEUKOCYTESUR SMALL* 12/30/2015 2320   Sepsis Labs: @LABRCNTIP (procalcitonin:4,lacticidven:4)  )No results found for this or any previous visit (from the past 240 hour(s)).    Radiology Studies: Ct Abdomen Pelvis Wo Contrast  12/31/2015  CLINICAL DATA:  Abdominal pain and nausea for 3 days EXAM: CT ABDOMEN AND PELVIS WITHOUT CONTRAST TECHNIQUE: Multidetector CT imaging of the abdomen and pelvis was performed following the standard protocol without IV contrast. COMPARISON:  None. FINDINGS: There is small to moderate volume ascites. There are unremarkable unenhanced appearances of the liver, spleen, pancreas and adrenals. There are several calculi in the gallbladder lumen measuring up to 4 mm. Kidneys are unremarkable. Urinary bladder is unremarkable. Uterus and adnexal structures are unremarkable. Bowel is unremarkable. Appendix is normal. Enteric  contrast has reached the rectum. There is a large right effusion and a moderate left effusion. There is mild patchy and linear opacity in both lung bases which may be atelectatic but infectious infiltrates cannot be excluded. No significant skeletal lesion is evident. The abdominal aorta is normal in caliber with moderate atherosclerotic calcification. Extensive coronary artery calcified plaque. IMPRESSION: 1. Small to moderate volume ascites. 2. Cholelithiasis. 3. Negative for bowel obstruction, perforation or focal inflammatory change. 4. Bilateral pleura effusions, right greater than left. Lung base opacities may be atelectatic, but infectious infiltrates cannot be excluded. Electronically Signed   By: Andreas Newport M.D.   On: 12/31/2015 06:28   Dg Chest 2 View  12/31/2015  CLINICAL DATA:  Dyspnea, cough and weakness for 3 months. EXAM: CHEST  2 VIEW COMPARISON:  11/16/2015 FINDINGS: There is unchanged moderate cardiomegaly and aortic tortuosity. There is focal airspace consolidation in the left base, as well as curvilinear atelectatic appearing opacities in the lateral lungs. Probable small effusions bilaterally. IMPRESSION: Airspace opacities and effusions bilaterally. This may represent pneumonia. Electronically Signed   By: Andreas Newport M.D.   On: 12/31/2015 00:51     Scheduled Meds: . amiodarone  200 mg Oral Daily  . aspirin EC  81 mg Oral Daily  . atorvastatin  20 mg Oral Daily  . aztreonam  1 g Intravenous Q8H  . diatrizoate meglumine-sodium      . ferrous sulfate  325 mg Oral Q breakfast  . furosemide  40 mg Intravenous Daily  . insulin aspart  0-9 Units Subcutaneous TID WC  . metoprolol succinate  25 mg Oral BID  . potassium chloride  10 mEq Oral Daily  . saccharomyces boulardii  250 mg Oral BID  . sodium chloride flush  3 mL Intravenous Q12H  . [START ON 01/01/2016] vancomycin  1,000 mg Intravenous Q24H   Continuous Infusions:    LOS: 0 days   Time Spent in minutes   30  minutes  Melquiades Kovar D.O. on 12/31/2015 at 1:09 PM  Between 7am to 7pm - Pager - [phone]  After 7pm go to [email or web address] - password [email or web address]  And look for the night coverage person covering for me after hours  [facility] Office  [phone]

## 2016-01-01 ENCOUNTER — Inpatient Hospital Stay (HOSPITAL_COMMUNITY): Payer: Commercial Managed Care - HMO

## 2016-01-01 ENCOUNTER — Encounter (HOSPITAL_COMMUNITY): Payer: Self-pay | Admitting: Cardiology

## 2016-01-01 DIAGNOSIS — J189 Pneumonia, unspecified organism: Secondary | ICD-10-CM

## 2016-01-01 DIAGNOSIS — I429 Cardiomyopathy, unspecified: Secondary | ICD-10-CM

## 2016-01-01 DIAGNOSIS — I2510 Atherosclerotic heart disease of native coronary artery without angina pectoris: Secondary | ICD-10-CM | POA: Diagnosis present

## 2016-01-01 DIAGNOSIS — R0600 Dyspnea, unspecified: Secondary | ICD-10-CM

## 2016-01-01 DIAGNOSIS — I5022 Chronic systolic (congestive) heart failure: Secondary | ICD-10-CM

## 2016-01-01 DIAGNOSIS — I5020 Unspecified systolic (congestive) heart failure: Secondary | ICD-10-CM

## 2016-01-01 DIAGNOSIS — R109 Unspecified abdominal pain: Secondary | ICD-10-CM

## 2016-01-01 LAB — ECHOCARDIOGRAM COMPLETE
AVPHT: 644 ms
CHL CUP DOP CALC LVOT VTI: 8.94 cm
CHL CUP MV DEC (S): 141
E/e' ratio: 12.62
EWDT: 141 ms
FS: 24 % — AB (ref 28–44)
Height: 62 in
IVS/LV PW RATIO, ED: 0.98
LA vol index: 37.1 mL/m2
LADIAMINDEX: 2.42 cm/m2
LASIZE: 44 mm
LAVOL: 67.4 mL
LAVOLA4C: 68.4 mL
LEFT ATRIUM END SYS DIAM: 44 mm
LV E/e' medial: 12.62
LV E/e'average: 12.62
LV PW d: 9.75 mm — AB (ref 0.6–1.1)
LV e' LATERAL: 7.07 cm/s
LVOT SV: 23 mL
LVOT area: 2.54 cm2
LVOT peak vel: 59.1 cm/s
LVOTD: 18 mm
MV Peak grad: 3 mmHg
MVPKEVEL: 89.2 m/s
RV LATERAL S' VELOCITY: 10 cm/s
RV sys press: 64 mmHg
Reg peak vel: 350 cm/s
TDI e' lateral: 7.07
TDI e' medial: 6.69
TRMAXVEL: 350 cm/s
Weight: 2593.6 oz

## 2016-01-01 LAB — GLUCOSE, CAPILLARY
GLUCOSE-CAPILLARY: 179 mg/dL — AB (ref 65–99)
GLUCOSE-CAPILLARY: 152 mg/dL — AB (ref 65–99)
GLUCOSE-CAPILLARY: 123 mg/dL — AB (ref 65–99)
Glucose-Capillary: 161 mg/dL — ABNORMAL HIGH (ref 65–99)

## 2016-01-01 LAB — BASIC METABOLIC PANEL
Anion gap: 8 (ref 5–15)
BUN: 19 mg/dL (ref 6–20)
CALCIUM: 8.4 mg/dL — AB (ref 8.9–10.3)
CHLORIDE: 103 mmol/L (ref 101–111)
CO2: 26 mmol/L (ref 22–32)
Creatinine, Ser: 0.99 mg/dL (ref 0.44–1.00)
GFR calc Af Amer: 60 mL/min — ABNORMAL LOW (ref >60)
GFR, EST NON AFRICAN AMERICAN: 52 mL/min — AB (ref >60)
GLUCOSE: 129 mg/dL — AB (ref 65–99)
Potassium: 3.4 mmol/L — ABNORMAL LOW (ref 3.5–5.1)
Sodium: 137 mmol/L (ref 135–145)

## 2016-01-01 LAB — PROTIME-INR
INR: 3.02 — ABNORMAL HIGH (ref 0.00–1.49)
Prothrombin Time: 30.7 seconds — ABNORMAL HIGH (ref 11.6–15.2)

## 2016-01-01 LAB — D-DIMER, QUANTITATIVE: D-Dimer, Quant: 2.18 ug/mL-FEU — ABNORMAL HIGH (ref 0.00–0.50)

## 2016-01-01 LAB — HIV ANTIBODY (ROUTINE TESTING W REFLEX): HIV SCREEN 4TH GENERATION: NONREACTIVE

## 2016-01-01 MED ORDER — FUROSEMIDE 10 MG/ML IJ SOLN
40.0000 mg | Freq: Two times a day (BID) | INTRAMUSCULAR | Status: DC
  Administered 2016-01-01 – 2016-01-05 (×8): 40 mg via INTRAVENOUS
  Filled 2016-01-01 (×9): qty 4

## 2016-01-01 MED ORDER — POTASSIUM CHLORIDE CRYS ER 20 MEQ PO TBCR
40.0000 meq | EXTENDED_RELEASE_TABLET | Freq: Once | ORAL | Status: AC
  Administered 2016-01-01: 40 meq via ORAL
  Filled 2016-01-01: qty 2

## 2016-01-01 MED ORDER — SACUBITRIL-VALSARTAN 24-26 MG PO TABS
1.0000 | ORAL_TABLET | Freq: Two times a day (BID) | ORAL | Status: DC
  Administered 2016-01-01 – 2016-01-02 (×3): 1 via ORAL
  Filled 2016-01-01 (×5): qty 1

## 2016-01-01 MED ORDER — FUROSEMIDE 10 MG/ML IJ SOLN
40.0000 mg | Freq: Once | INTRAMUSCULAR | Status: AC
  Administered 2016-01-01: 40 mg via INTRAVENOUS
  Filled 2016-01-01: qty 4

## 2016-01-01 NOTE — Progress Notes (Signed)
Echocardiogram 2D Echocardiogram has been performed.  Shannon [name] 01/01/2016, 12:42 PM

## 2016-01-01 NOTE — Consult Note (Signed)
Cardiology Consult Note  Admit date: 12/30/2015 Name: Shannon [name] 82 y.o.  female DOB:  1931/09/27 MRN:  [id number]  Today's date:  01/01/2016  Referring Physician:    [facility]  Primary Physician:    Dr. Rosana Hoes  Reason for Consultation:    Heart failure  IMPRESSIONS: 1.  New onset of a cardiomyopathy with acute on chronic systolic heart failure with drop in ejection fraction since October 2016 2.  Acute on chronic systolic heart failure 3.  Coronary artery disease with previous lateral infarction and previous stenting of the marginal branch 4.  Diabetes mellitus with ulceration 5.  Hyperlipidemia 6.  History of TIA 7.  Persistent atrial fibrillation since July 2016 8.  Toe ulcer  RECOMMENDATION: 1.  She has new onset of a cardiomyopathy and will need to be started on optimal medical therapy.  She will be initiated on treatment with Entresto and continued on metoprolol succinate.  She also will likely need to have spironolactone added to her regimen.  2.  Continue intravenous diuresis with furosemide 3.  May need to have another ischemic evaluation such as a cath as there has not been any other precipitating reason or medications for development of congestive heart failure  HISTORY: This 82 year old female has a history of coronary artery disease with previous lateral wall infarction.  She has had stenting of the marginal branch in 2007.  She had an EP study and later had a TEE cardioversion but did not have ablation in 2014.  She maintained sinus rhythm until approximately July 2016.  She has had peripheral edema and her last echo was in October 2016 showing an EF of 50-55%.  Over the past few months she has had edema as well as progressive weight gain anorexia and some nausea.  She has gained around 30 pounds according to her and according to the chart at least 16 pounds this year.  She presented to the emergency room with acute respiratory failure as well as some  abdominal pain and swelling.  BNP was elevated and she was admitted to the hospital where an echocardiogram showed an ejection fraction of around 25% with global hypokinesis.  This is a marked drop from the previous one.  She was also found to have moderate pulmonary hypertension.  She described a mild amount of tightness across her chest is of her rope was across her chest earlier today but did not have any history of a viral illness or flulike symptoms area she has been compliant with her medications.  She has had significant malaise dyspnea on exertion as well as peripheral edema and has had her legs wrapped.  She has had a ulcer on her toe has been seen at the wound clinic.  She denies PND or orthopnea.  Past Medical History  Diagnosis Date  . CAD (coronary artery disease)     a. 05/2006 Cath/PCI: LAD 78m, D1 40 ost, LCX nl, OM1 95 (3.0x20 Taxus DES), RCA nl, EF 40% w/ lat AK;  04/2007 Ex MV: minimal lat ischemia in area of prior infarct->low risk-> med Rx.  [name] Hyperlipidemia   . Diabetes mellitus   . MVP (mitral valve prolapse)     a. 10/2003 Echo: nl LV fxn, mild MR/TR/AS  . Myocardial infarction ([place])   . Sleep apnea   . Transient ischemic attack   . Arthritis   . Baker's cyst of knee   . Chronic atrial fibrillation [facility])       Past Surgical History  Procedure Laterality Date  . Tonsillectomy    . Coronary angioplasty      Status post PTCA and stenting of the first obtuse marginal-05/13/2006. We placed a 3.0 x 20 mm Taxus stent. It was post dilated using a 3.25 mm noncompliant balloon up to 14 atmospheres  . Cataracts    . Atrial flutter ablation N/A 10/09/2012    Procedure: ATRIAL FLUTTER ABLATION;  Surgeon: Thompson Grayer, MD;  Location: [facility] CATH LAB;  Service: Cardiovascular;  Laterality: N/A;  . Colonoscopy N/A 10/13/2015    Procedure: COLONOSCOPY;  Surgeon: Manus Gunning, MD;  Location: [facility] ENDOSCOPY;  Service: Gastroenterology;  Laterality: N/A;     Allergies:  is  allergic to demerol; erythromycin; penicillins; and percocet.   Medications: Prior to Admission medications   Medication Sig Start Date End Date Taking? Authorizing Provider  amiodarone (PACERONE) 200 MG tablet Take 1 tablet (200 mg total) by mouth daily. 07/22/15  Yes Thayer Headings, MD  atorvastatin (LIPITOR) 20 MG tablet TAKE ONE TABLET BY MOUTH ONE TIME DAILY 04/08/15  Yes Thayer Headings, MD  Cholecalciferol (VITAMIN D PO) Take 1 tablet by mouth daily.   Yes Historical Provider, MD  Cyanocobalamin (VITAMIN B-12 PO) Take 1 tablet by mouth daily.   Yes Historical Provider, MD  ferrous sulfate 325 (65 FE) MG tablet Take 325 mg by mouth daily with breakfast.   Yes Historical Provider, MD  furosemide (LASIX) 20 MG tablet Take 20 mg by mouth daily. 07/15/15  Yes Historical Provider, MD  HYDROcodone-acetaminophen (NORCO/VICODIN) 5-325 MG tablet Take 0.5-1 tablets by mouth every 4 (four) hours as needed for severe pain. 11/16/15  Yes Margarita Mail, PA-C  hydrocortisone (PROCTO-PAK) 1 % CREA Apply 1 application topically daily. 10/14/15  Yes Donne Hazel, MD  KLOR-CON 10 10 MEQ tablet Take 10 mEq by mouth daily. 07/15/15  Yes Historical Provider, MD  metFORMIN (GLUCOPHAGE) 500 MG tablet Take 500 mg by mouth 2 (two) times daily.  04/06/15  Yes Historical Provider, MD  metoprolol succinate (TOPROL-XL) 50 MG 24 hr tablet Take 25 mg by mouth 2 (two) times daily. Take with or immediately following a meal.   Yes Historical Provider, MD  nitroGLYCERIN (NITROSTAT) 0.4 MG SL tablet Place 0.4 mg under the tongue every 5 (five) minutes x 3 doses as needed for chest pain. Up to 3 doses, if pain continues, call 911. 06/05/11  Yes Thayer Headings, MD  Pyridoxine HCl (VITAMIN B-6 PO) Take 1 tablet by mouth daily.   Yes Historical Provider, MD  saccharomyces boulardii (FLORASTOR) 250 MG capsule Take 1 capsule (250 mg total) by mouth 2 (two) times daily. 10/14/15  Yes Donne Hazel, MD  SANTYL ointment Apply 1 application  topically daily.  10/07/15  Yes Historical Provider, MD  warfarin (COUMADIN) 2.5 MG tablet Take as directed by Coumadin Clinic. Patient taking differently: Take 1.25 mg by mouth daily.  11/04/15  Yes Thayer Headings, MD  ACCU-CHEK SMARTVIEW test strip  09/19/15   Historical Provider, MD    Family History: Family Status  Relation Status Death Age  . Mother Deceased   . Father Deceased    Social History:   reports that she quit smoking about 52 years ago. Her smoking use included Cigarettes. She smoked 1.00 pack per day. She has never used smokeless tobacco. She reports that she does not drink alcohol or use illicit drugs.   Social History   Social History Narrative   Lives in [place] with husband.  Does not routinely exercise but is able to keep house and go food shopping.    Review of Systems: She has had a significant wound and has been having her toe wrapped.  He has been seen by the infectious disease clinic and there is concern about whether she has osteomyelitis of her foot.  She has significant peripheral neuropathy also.  She has had chronic edema and is thought to have venous stasis also.  She has had a cough and there is been a question about whether she has had pneumonia or not.  She has had a prior history of gastrointestinal bleeding and was taken off of the newer generation anticoagulants and placed on Coumadin recently.  Some urinary frequency as well as nocturia.  Other than as noted above remainder of the review of systems is unremarkable.  Physical Exam: BP 117/78 mmHg  Pulse 108  Temp(Src) 98.1 F (36.7 C) (Oral)  Resp 19  Ht 5\' 2"  (1.575 m)  Wt 73.528 kg (162 lb 1.6 oz)  BMI 29.64 kg/m2  SpO2 93%  General appearance: Pale-appearing pleasant elderly white female in no acute distress Head: Normocephalic, without obvious abnormality, atraumatic Eyes: conjunctivae/corneas clear. PERRL, EOM's intact. Fundi not examined  Neck: no adenopathy, no carotid bruit, supple,  symmetrical, trachea midline and JVD is elevated to the angle of the jaw Lungs: Reduced breath sounds particularly at the left base, by basilar rales Heart: Irregular rate and rhythm, normal S1 and S2, no S3, 1 to 2/6 systolic murmur right sternal border Abdomen: Mildly distended, mildly tender soft without rebound Pelvic: deferred Extremities: 2-3+ peripheral edema, pedal pulses are difficult to feel, normal range of motion and strength, no spinal abnormalities Pulses: 2+ and symmetric Femoral pulses present, distal pulses difficult to feel because of edema Skin: Skin color, texture, turgor normal. No rashes or lesions Neurologic: Grossly normal  Labs: CBC  Recent Labs  12/31/15 0525  WBC 8.1  RBC 3.56*  HGB 11.7*  HCT 37.0  PLT 263  MCV 103.9*  MCH 32.9  MCHC 31.6  RDW 17.9*  LYMPHSABS 1.1  MONOABS 0.7  EOSABS 0.0  BASOSABS 0.0   CMP   Recent Labs  12/31/15 0525 01/01/16 0248  NA 136 137  K 3.5 3.4*  CL 101 103  CO2 23 26  GLUCOSE 138* 129*  BUN 20 19  CREATININE 0.99 0.99  CALCIUM 8.6* 8.4*  PROT 6.0*  --   ALBUMIN 2.5*  --   AST 34  --   ALT 27  --   ALKPHOS 197*  --   BILITOT 1.2  --   GFRNONAA 52* 52*  GFRAA 60* 60*   BNP (last 3 results) BNP    Component Value Date/Time   BNP 593.0* 12/30/2015 2327   Cardiac Panel (last 3 results) Troponin (Point of Care Test) No results for input(s): TROPIPOC in the last 72 hours. Cardiac Panel (last 3 results)  Recent Labs  12/31/15 0525 12/31/15 1022 12/31/15 1614  TROPONINI 0.07* 0.06* 0.05*     Radiology:  Airspace disease and effusions bilaterally, cardiomegaly  EKG: Atrial fibrillation, low voltage EKG, IV conduction delay, old anteroseptal infarction, nonspecific ST changes  Signed:  W. Doristine Church MD [facility]   Cardiology Consultant  01/01/2016, 3:09 PM

## 2016-01-01 NOTE — Progress Notes (Signed)
PROGRESS NOTE    Shannon [name]  [id number] DOB: 10/21/1931 DOA: 12/30/2015 PCP: Haywood Pao, MD   Brief Narrative:  HPI on 12/31/2015 by Dr. Gean Birchwood Shannon [name] is a 82 y.o. female with CAD status post stenting, CHF, paroxysmal atrial fibrillation, diabetes mellitus type 2, hyperlipidemia was brought to the ER after patient was complaining of abdominal pain and shortness of breath. Patient's abdominal pain started last evening which was generalized and by the time patient reached ER pain has largely resolved. Patient has recently followed up with her cardiologist and at that time and complained of weakness poor appetite and as per the patient's daughter lipase was elevated during the lab works done at the cardiologist's office. Patient was found be short of breath and states she has been having some nonproductive cough for last 1 week and also has gained 30 pounds over the last 3 months. Denies any chest pain. Patient takes Lasix 20 mg daily. On exam patient also has bilateral lower extremity edema and wound of the left great toe. Chest x-ray shows features concerning for pneumonia and BNP is elevated. Troponin is mildly elevated. Patient is on Coumadin. Patient at this time will be admitted for acute respiratory failure probably from CHF and pneumonia and abdominal discomfort.   Assessment & Plan   Acute respiratory failure with hypoxia -Likely multi-factorial including heart failure and possible pneumonia] -Upon admission to the ED, SPO2 87%, continue oxygen supplementation -BNP 593 -Chest x-ray: Airspace opacities with effusions bilaterally. -Echocardiogram pending -Monitor intake and output, daily weights -Continue empiric antibiotics -Continue vancomycin and aztreonam -Echocardiogram 04/07/2015 showed an EF of 50-55% -Monitor intake and output, daily weights -Ordered Ddimer, PE unlikely as patient supratherapeutic INR  Abdominal pain -CT abdomen and  pelvis: small to moderate volume ascites, cholelithiasis, negative for bowel obstruction, perforation or ambulatory change. Bilateral pleural effusions, right greater than left. -Continue to monitor, pain control  Coronary artery disease -Denies chest pain -Troponin mildly elevated but trending downward (0.1, 0.07, 0.06, 0.05) -Continue aspirin, statin, metoprolol  Atrial fibrillation, chronic -CHADSVASC 4 (age, gender, DM) -Coumadin held, INR supratherapeutic -Heparin to start when INR <2  Diabetes mellitus, type II -Metformin held, continued findings for CBG monitoring  Hyperlipidemia -Continue statin  Chronic Anemia -Continue iron supplementation -Baseline hemoglobin approximately 10, currently 11.7  DVT Prophylaxis  heparin  Code Status: Full  Family Communication: None at bedside  Disposition Plan: Admitted. Continue to monitor closely, echocardiogram pending  Consultants None  Procedures  Echocardiogram  Antibiotics   Anti-infectives    Start     Dose/Rate Route Frequency Ordered Stop   01/01/16 0500  vancomycin (VANCOCIN) IVPB 1000 mg/200 mL premix     1,000 mg 200 mL/hr over 60 Minutes Intravenous Every 24 hours 12/31/15 0440 01/08/16 0459   12/31/15 1400  aztreonam (AZACTAM) 1 g in dextrose 5 % 50 mL IVPB     1 g 100 mL/hr over 30 Minutes Intravenous Every 8 hours 12/31/15 0440 01/08/16 1359   12/31/15 0600  aztreonam (AZACTAM) 2 g in dextrose 5 % 50 mL IVPB  Status:  Discontinued     2 g 100 mL/hr over 30 Minutes Intravenous Every 8 hours 12/31/15 0428 12/31/15 0435   12/31/15 0130  aztreonam (AZACTAM) 1 g in dextrose 5 % 50 mL IVPB     1 g 100 mL/hr over 30 Minutes Intravenous  Once 12/31/15 0121 12/31/15 0338   12/31/15 0130  vancomycin (VANCOCIN) IVPB 1000 mg/200 mL premix  1,000 mg 200 mL/hr over 60 Minutes Intravenous NOW 12/31/15 0122 12/31/15 0247      Subjective:   Shannon [name] seen and examined today.  Patient feels her breathing  has worsened and it is hard to breath.  Denies any chest pain, abdominal pain at this time. Denies nausea or vomiting, dizziness or headache.  Objective:   Filed Vitals:   12/31/15 0904 12/31/15 2037 01/01/16 0440 01/01/16 0916  BP: 102/62 102/77 103/78 117/78  Pulse: 105 109 111 108  Temp:  97.6 F (36.4 C) 98.1 F (36.7 C)   TempSrc:  Oral Oral   Resp:  17 19   Height:      Weight:   73.528 kg (162 lb 1.6 oz)   SpO2:  97% 93%     Intake/Output Summary (Last 24 hours) at 01/01/16 1203 Last data filed at 01/01/16 0440  Gross per 24 hour  Intake    300 ml  Output    350 ml  Net    -50 ml   Filed Weights   12/30/15 2253 12/31/15 0303 01/01/16 0440  Weight: 74.889 kg (165 lb 1.6 oz) 74.844 kg (165 lb) 73.528 kg (162 lb 1.6 oz)    Exam  General: Well developed, well nourished, mild distress  HEENT: NCAT, mucous membranes moist.   Neck: Supple, + JVD, no masses  Cardiovascular: S1 S2 auscultated, Irregular  Respiratory: Diminshed but clear, tachypneic   Abdomen: Soft, nontender, nondistended, + bowel sounds  Extremities: warm dry without cyanosis clubbing. LE Edema.  Neuro: AAOx3, nonfocal  Psych: Normal affect and demeanor   Data Reviewed: I have personally reviewed following labs and imaging studies  CBC:  Recent Labs Lab 12/27/15 1708 12/30/15 2327 12/31/15 0525  WBC 7.9 7.6 8.1  NEUTROABS 6794  --  6.3  HGB 12.0 11.5* 11.7*  HCT 36.8 37.4 37.0  MCV 101.9* 104.8* 103.9*  PLT 259 273 [id number]   Basic Metabolic Panel:  Recent Labs Lab 12/27/15 1708 12/30/15 2327 12/31/15 0525 01/01/16 0248  NA 138 138 136 137  K 4.3 4.3 3.5 3.4*  CL 99 105 101 103  CO2 26 23 23 26   GLUCOSE 103* 143* 138* 129*  BUN 20 22* 20 19  CREATININE 1.00* 1.07* 0.99 0.99  CALCIUM 8.7 8.8* 8.6* 8.4*   GFR: Estimated Creatinine Clearance: 41.2 mL/min (by C-G formula based on Cr of 0.99). Liver Function Tests:  Recent Labs Lab 12/27/15 1708 12/30/15 2327  12/31/15 0525  AST 30 39 34  ALT 27 28 27   ALKPHOS 211* 202* 197*  BILITOT 1.1 1.3* 1.2  PROT 6.0* 5.9* 6.0*  ALBUMIN 3.2* 2.7* 2.5*    Recent Labs Lab 12/30/15 2327 12/31/15 0525  LIPASE 41 38   No results for input(s): AMMONIA in the last 168 hours. Coagulation Profile:  Recent Labs Lab 12/30/15 1454 12/30/15 2327 01/01/16 0248  INR 4.0 3.35* 3.02*   Cardiac Enzymes:  Recent Labs Lab 12/30/15 2346 12/31/15 0525 12/31/15 1022 12/31/15 1614  TROPONINI 0.10* 0.07* 0.06* 0.05*   BNP (last 3 results) No results for input(s): PROBNP in the last 8760 hours. HbA1C: No results for input(s): HGBA1C in the last 72 hours. CBG:  Recent Labs Lab 12/31/15 1103 12/31/15 1619 12/31/15 2131 01/01/16 0609 01/01/16 1130  GLUCAP 153* 141* 134* 161* 179*   Lipid Profile: No results for input(s): CHOL, HDL, LDLCALC, TRIG, CHOLHDL, LDLDIRECT in the last 72 hours. Thyroid Function Tests: No results for input(s): TSH, T4TOTAL, FREET4, T3FREE, THYROIDAB in  the last 72 hours. Anemia Panel: No results for input(s): VITAMINB12, FOLATE, FERRITIN, TIBC, IRON, RETICCTPCT in the last 72 hours. Urine analysis:    Component Value Date/Time   COLORURINE YELLOW 12/30/2015 2320   APPEARANCEUR CLOUDY* 12/30/2015 2320   LABSPEC 1.020 12/30/2015 2320   PHURINE 5.0 12/30/2015 2320   GLUCOSEU NEGATIVE 12/30/2015 2320   HGBUR NEGATIVE 12/30/2015 2320   BILIRUBINUR NEGATIVE 12/30/2015 2320   KETONESUR NEGATIVE 12/30/2015 2320   PROTEINUR 30* 12/30/2015 2320   UROBILINOGEN 0.2 10/09/2012 1551   NITRITE NEGATIVE 12/30/2015 2320   LEUKOCYTESUR SMALL* 12/30/2015 2320   Sepsis Labs: @LABRCNTIP (procalcitonin:4,lacticidven:4)  )No results found for this or any previous visit (from the past 240 hour(s)).    Radiology Studies: Ct Abdomen Pelvis Wo Contrast  12/31/2015  CLINICAL DATA:  Abdominal pain and nausea for 3 days EXAM: CT ABDOMEN AND PELVIS WITHOUT CONTRAST TECHNIQUE: Multidetector  CT imaging of the abdomen and pelvis was performed following the standard protocol without IV contrast. COMPARISON:  None. FINDINGS: There is small to moderate volume ascites. There are unremarkable unenhanced appearances of the liver, spleen, pancreas and adrenals. There are several calculi in the gallbladder lumen measuring up to 4 mm. Kidneys are unremarkable. Urinary bladder is unremarkable. Uterus and adnexal structures are unremarkable. Bowel is unremarkable. Appendix is normal. Enteric contrast has reached the rectum. There is a large right effusion and a moderate left effusion. There is mild patchy and linear opacity in both lung bases which may be atelectatic but infectious infiltrates cannot be excluded. No significant skeletal lesion is evident. The abdominal aorta is normal in caliber with moderate atherosclerotic calcification. Extensive coronary artery calcified plaque. IMPRESSION: 1. Small to moderate volume ascites. 2. Cholelithiasis. 3. Negative for bowel obstruction, perforation or focal inflammatory change. 4. Bilateral pleura effusions, right greater than left. Lung base opacities may be atelectatic, but infectious infiltrates cannot be excluded. Electronically Signed   By: Andreas Newport M.D.   On: 12/31/2015 06:28   Dg Chest 2 View  12/31/2015  CLINICAL DATA:  Dyspnea, cough and weakness for 3 months. EXAM: CHEST  2 VIEW COMPARISON:  11/16/2015 FINDINGS: There is unchanged moderate cardiomegaly and aortic tortuosity. There is focal airspace consolidation in the left base, as well as curvilinear atelectatic appearing opacities in the lateral lungs. Probable small effusions bilaterally. IMPRESSION: Airspace opacities and effusions bilaterally. This may represent pneumonia. Electronically Signed   By: Andreas Newport M.D.   On: 12/31/2015 00:51     Scheduled Meds: . amiodarone  200 mg Oral Daily  . aspirin EC  81 mg Oral Daily  . atorvastatin  20 mg Oral Daily  . aztreonam  1 g  Intravenous Q8H  . ferrous sulfate  325 mg Oral Q breakfast  . furosemide  40 mg Intravenous Daily  . insulin aspart  0-9 Units Subcutaneous TID WC  . metoprolol succinate  25 mg Oral BID  . potassium chloride  10 mEq Oral Daily  . saccharomyces boulardii  250 mg Oral BID  . sodium chloride flush  3 mL Intravenous Q12H  . vancomycin  1,000 mg Intravenous Q24H   Continuous Infusions:    LOS: 1 day   Time Spent in minutes   30 minutes  Cylus Douville D.O. on 01/01/2016 at 12:03 PM  Between 7am to 7pm - Pager - [phone]  After 7pm go to [email or web address] - password [email or web address]  And look for the night coverage person covering for me after hours  [facility] Office  [phone]  

## 2016-01-02 ENCOUNTER — Inpatient Hospital Stay (HOSPITAL_COMMUNITY): Payer: Commercial Managed Care - HMO

## 2016-01-02 ENCOUNTER — Telehealth: Payer: Self-pay | Admitting: Cardiovascular Disease

## 2016-01-02 DIAGNOSIS — I2510 Atherosclerotic heart disease of native coronary artery without angina pectoris: Secondary | ICD-10-CM

## 2016-01-02 DIAGNOSIS — R7989 Other specified abnormal findings of blood chemistry: Secondary | ICD-10-CM

## 2016-01-02 DIAGNOSIS — M7989 Other specified soft tissue disorders: Secondary | ICD-10-CM

## 2016-01-02 DIAGNOSIS — I5023 Acute on chronic systolic (congestive) heart failure: Secondary | ICD-10-CM | POA: Insufficient documentation

## 2016-01-02 DIAGNOSIS — I482 Chronic atrial fibrillation: Secondary | ICD-10-CM

## 2016-01-02 DIAGNOSIS — R778 Other specified abnormalities of plasma proteins: Secondary | ICD-10-CM | POA: Insufficient documentation

## 2016-01-02 LAB — BASIC METABOLIC PANEL
ANION GAP: 7 (ref 5–15)
BUN: 18 mg/dL (ref 6–20)
CO2: 27 mmol/L (ref 22–32)
CREATININE: 0.93 mg/dL (ref 0.44–1.00)
Calcium: 8.1 mg/dL — ABNORMAL LOW (ref 8.9–10.3)
Chloride: 102 mmol/L (ref 101–111)
GFR, EST NON AFRICAN AMERICAN: 56 mL/min — AB (ref >60)
GLUCOSE: 123 mg/dL — AB (ref 65–99)
Potassium: 3.3 mmol/L — ABNORMAL LOW (ref 3.5–5.1)
Sodium: 136 mmol/L (ref 135–145)

## 2016-01-02 LAB — GLUCOSE, CAPILLARY
GLUCOSE-CAPILLARY: 207 mg/dL — AB (ref 65–99)
GLUCOSE-CAPILLARY: 132 mg/dL — AB (ref 65–99)
Glucose-Capillary: 129 mg/dL — ABNORMAL HIGH (ref 65–99)
Glucose-Capillary: 163 mg/dL — ABNORMAL HIGH (ref 65–99)

## 2016-01-02 LAB — PROTIME-INR
INR: 2.83 — ABNORMAL HIGH (ref 0.00–1.49)
Prothrombin Time: 29.3 seconds — ABNORMAL HIGH (ref 11.6–15.2)

## 2016-01-02 LAB — CBC
HCT: 35.1 % — ABNORMAL LOW (ref 36.0–46.0)
Hemoglobin: 10.9 g/dL — ABNORMAL LOW (ref 12.0–15.0)
MCH: 32.2 pg (ref 26.0–34.0)
MCHC: 31.1 g/dL (ref 30.0–36.0)
MCV: 103.8 fL — AB (ref 78.0–100.0)
PLATELETS: 216 10*3/uL (ref 150–400)
RBC: 3.38 MIL/uL — ABNORMAL LOW (ref 3.87–5.11)
RDW: 17.7 % — AB (ref 11.5–15.5)
WBC: 6.7 10*3/uL (ref 4.0–10.5)

## 2016-01-02 LAB — MAGNESIUM: MAGNESIUM: 1.3 mg/dL — AB (ref 1.7–2.4)

## 2016-01-02 MED ORDER — LORAZEPAM 2 MG/ML IJ SOLN
0.5000 mg | Freq: Once | INTRAMUSCULAR | Status: DC
Start: 2016-01-02 — End: 2016-01-16
  Filled 2016-01-02: qty 1

## 2016-01-02 MED ORDER — DEXTROSE 5 % IV SOLN
2.0000 g | INTRAVENOUS | Status: DC
  Administered 2016-01-02 – 2016-01-06 (×5): 2 g via INTRAVENOUS
  Filled 2016-01-02 (×6): qty 2

## 2016-01-02 MED ORDER — POTASSIUM CHLORIDE CRYS ER 20 MEQ PO TBCR
40.0000 meq | EXTENDED_RELEASE_TABLET | Freq: Once | ORAL | Status: AC
  Administered 2016-01-02: 40 meq via ORAL
  Filled 2016-01-02: qty 2

## 2016-01-02 NOTE — Progress Notes (Signed)
SATURATION QUALIFICATIONS: (This note is used to comply with regulatory documentation for home oxygen)  Patient Saturations on Room Air at Rest = 88%  Patient Saturations on Room Air while Ambulating = 85%  Patient Saturations on 2 Liters of oxygen while Ambulating = 89%  Please briefly explain why patient needs home oxygen:  Patient hypoxic while ambulating on room air. Castor, Teller 01/02/2016

## 2016-01-02 NOTE — Care Management Important Message (Signed)
Important Message  Patient Details  Name: Shannon [name] MRN: [id number] Date of Birth: [birth year]   Medicare Important Message Given:  Yes    Nathen May 01/02/2016, 10:47 AM

## 2016-01-02 NOTE — Progress Notes (Signed)
ANTICOAGULATION CONSULT NOTE - INITIAL  Pharmacy Consult for  Heparin when INR <2 Indication: afib  Allergies  Allergen Reactions  . Demerol Other (See Comments)    Burning sensation all over body  . Erythromycin Nausea And Vomiting  . Penicillins Other (See Comments)    unknown  . Percocet [Oxycodone-Acetaminophen] Swelling    Patient Measurements: Height: 5\' 2"  (157.5 cm) Weight: 156 lb (70.761 kg) IBW/kg (Calculated) : 50.1  Heparin dosing wt: 66 kg  Vital Signs: Temp: 97.5 F (36.4 C) (07/03 0410) Temp Source: Oral (07/03 0410) BP: 92/58 mmHg (07/03 0410) Pulse Rate: 107 (07/03 0410) Intake/Output from previous day: 07/02 0701 - 07/03 0700 In: 410 [P.O.:360; IV Piggyback:50] Out: 2800 [Urine:2500; Stool:300] Intake/Output from this shift: Total I/O In: -  Out: 300 [Urine:300]  Labs:  Recent Labs  12/30/15 2327 12/31/15 0525 01/01/16 0248 01/02/16 0306  WBC 7.6 8.1  --  6.7  HGB 11.5* 11.7*  --  10.9*  PLT 273 263  --  216  CREATININE 1.07* 0.99 0.99 0.93   Estimated Creatinine Clearance: 43 mL/min (by C-G formula based on Cr of 0.93). No results for input(s): VANCOTROUGH, VANCOPEAK, VANCORANDOM, GENTTROUGH, GENTPEAK, GENTRANDOM, TOBRATROUGH, TOBRAPEAK, TOBRARND, AMIKACINPEAK, AMIKACINTROU, AMIKACIN in the last 72 hours.   Microbiology: No results found for this or any previous visit (from the past 720 hour(s)).  Assessment: 82 y.o. F presents with abd pain.  She is on Coumadin PTA for afib. Admission INR 3.35. Plan to hold coumadin and start heparin bridge when INR < 2. -INR= 2.83  Home dose: Coumadin 1.25mg  po daily - last dose 6/29  Goal of Therapy:  Heparin level 0.3-0.7 units/ml Will monitor platelets according to protocol  Plan:  No heparin needed at this time Daily INR  Hildred Laser, Pharm D 01/02/2016 9:56 AM

## 2016-01-02 NOTE — Evaluation (Signed)
Physical Therapy Evaluation Patient Details Name: Shannon [name] MRN: [id number] DOB: 04-25-1932 Today's Date: 01/02/2016   History of Present Illness  Shannon [name] is a 82 y.o. female with CAD status post stenting, CHF, paroxysmal atrial fibrillation, diabetes mellitus type 2, hyperlipidemia who was admitted due to acute hypoxic respiratory faiilure with new cardiomyopathy with acute on chronic CHF.  Clinical Impression  Patient presents with decreased independence with mobility due to deficits listed in PT problem list.  She will benefit from skilled PT in the acute setting to allow return home with family support and follow up HHPT.  Encouraged her to consider placement for her spouse and to stay with her son even though she feels she is a burden.  See note for qualifying for home O2 following this note.      Follow Up Recommendations Home health PT;Supervision - Intermittent    Equipment Recommendations  Other (comment) (toilet riser)    Recommendations for Other Services       Precautions / Restrictions Precautions Precautions: Fall Precaution Comments: O2 dep Restrictions Weight Bearing Restrictions: No      Mobility  Bed Mobility Overal bed mobility: Needs Assistance Bed Mobility: Supine to Sit     Supine to sit: Supervision     General bed mobility comments: assist for O2/for safety  Transfers Overall transfer level: Needs assistance Equipment used: Rolling walker (2 wheeled) Transfers: Sit to/from Stand Sit to Stand: Min assist         General transfer comment: up from bed and from 3:1, cues for safety stand to sit at recliner  Ambulation/Gait Ambulation/Gait assistance: Min assist Ambulation Distance (Feet): 120 Feet Assistive device: Rolling walker (2 wheeled) Gait Pattern/deviations: Step-through pattern;Decreased stride length;Trunk flexed     General Gait Details: suprised at her ability to walk today, states would have been unable to  walk as far yesterday; Measured O2 sats with ambulation on and off O2 (see separate note)  Stairs            Wheelchair Mobility    Modified Rankin (Stroke Patients Only)       Balance Overall balance assessment: Needs assistance         Standing balance support: Bilateral upper extremity supported Standing balance-Leahy Scale: Poor Standing balance comment: UE support for balance, performed perineal hygiene when seated so did not have to balance                             Pertinent Vitals/Pain Pain Assessment: No/denies pain    Home Living Family/patient expects to be discharged to:: Private residence Living Arrangements: Children Available Help at Discharge: Family Type of Home: House Home Access: Stairs to enter Entrance Stairs-Rails: None Entrance Stairs-Number of Steps: 2 Home Layout: One level Home Equipment: Environmental consultant - 2 wheels;Cane - single point Additional Comments: has been staying with her son since last admit with daughter and step daughter helping care for her spouse at her home, but recently went back home and had started taking care of her spouse again (who is bed bound with Alzheimers.)    Prior Function Level of Independence: Independent with assistive device(s)               Hand Dominance        Extremity/Trunk Assessment   Upper Extremity Assessment: Generalized weakness           Lower Extremity Assessment: Generalized weakness  Cervical / Trunk Assessment: Kyphotic  Communication   Communication: No difficulties  Cognition Arousal/Alertness: Awake/alert Behavior During Therapy: WFL for tasks assessed/performed Overall Cognitive Status: Within Functional Limits for tasks assessed                      General Comments      Exercises        Assessment/Plan    PT Assessment Patient needs continued PT services  PT Diagnosis Generalized weakness;Difficulty walking   PT Problem List  Decreased strength;Decreased mobility;Decreased balance;Cardiopulmonary status limiting activity;Decreased knowledge of use of DME;Decreased activity tolerance  PT Treatment Interventions DME instruction;Gait training;Functional mobility training;Stair training;Therapeutic activities;Therapeutic exercise;Balance training   PT Goals (Current goals can be found in the Care Plan section) Acute Rehab PT Goals Patient Stated Goal: To return home as independent as possible PT Goal Formulation: With patient Time For Goal Achievement: 01/09/16 Potential to Achieve Goals: Good    Frequency Min 3X/week   Barriers to discharge        Co-evaluation               End of Session Equipment Utilized During Treatment: Gait belt;Oxygen Activity Tolerance: Patient limited by fatigue Patient left: in chair;with call bell/phone within reach;Other (comment) (feet elevated)           Time: [id number] PT Time Calculation (min) (ACUTE ONLY): 25 min   Charges:   PT Evaluation $PT Eval Moderate Complexity: 1 Procedure PT Treatments $Gait Training: 8-22 mins   PT G Codes:        Reginia Naas 2016-01-07, 2:06 PM  Magda Kiel, [place] 07-Jan-2016

## 2016-01-02 NOTE — Telephone Encounter (Signed)
Patient is currently admitted for heart failure. She was seen by Dr. Wynonia Lawman over the weekend.   To Dr. Acie Fredrickson for his information.

## 2016-01-02 NOTE — Progress Notes (Signed)
PROGRESS NOTE    Shannon [name]  [id number] DOB: 1931-08-20 DOA: 12/30/2015 PCP: Haywood Pao, MD   Brief Narrative:  HPI on 12/31/2015 by Dr. Gean Birchwood Shannon [name] is a 82 y.o. female with CAD status post stenting, CHF, paroxysmal atrial fibrillation, diabetes mellitus type 2, hyperlipidemia was brought to the ER after patient was complaining of abdominal pain and shortness of breath. Patient's abdominal pain started last evening which was generalized and by the time patient reached ER pain has largely resolved. Patient has recently followed up with her cardiologist and at that time and complained of weakness poor appetite and as per the patient's daughter lipase was elevated during the lab works done at the cardiologist's office. Patient was found be short of breath and states she has been having some nonproductive cough for last 1 week and also has gained 30 pounds over the last 3 months. Denies any chest pain. Patient takes Lasix 20 mg daily. On exam patient also has bilateral lower extremity edema and wound of the left great toe. Chest x-ray shows features concerning for pneumonia and BNP is elevated. Troponin is mildly elevated. Patient is on Coumadin. Patient at this time will be admitted for acute respiratory failure probably from CHF and pneumonia and abdominal discomfort.   Assessment & Plan   Acute respiratory failure with hypoxia/Acute systolic CHF -Improving  -Likely multi-factorial including heart failure and possible pneumonia -Upon admission to the ED, SPO2 87%, continue oxygen supplementation -BNP 593 -Chest x-ray: Airspace opacities with effusions bilaterally. -Echocardiogram 04/07/2015 showed an EF of 50-55% -Echocardiogram 01/01/2016: EF 20-25%, diffuse hypokinesis, Severe TR with severely elevated pulmonary pressure -Monitor intake and output, daily weights. UOP over past 24hrs 2500cc (net -[id number]) -Continue empiric antibiotics. Initially placed on  vancomycin and aztreonam- will deescalate to ceftriaxone (patient does have a PCN allergy- but it is unknown) -Monitor intake and output, daily weights -Although ddimer elevated, unlikely PE, as patient's INR has been supratherapeutic -Cardiology consulted and appreciated -Started on Ernesto -Pending futher recommendations from cardiology  Abdominal pain -Improved  -CT abdomen and pelvis: small to moderate volume ascites, cholelithiasis, negative for bowel obstruction, perforation or ambulatory change. Bilateral pleural effusions, right greater than left. -Continue to monitor, pain control  Coronary artery disease -Denies chest pain -Troponin mildly elevated but trending downward (0.1, 0.07, 0.06, 0.05) -Continue aspirin, statin, metoprolol  Atrial fibrillation, chronic -CHADSVASC 4 (age, gender, DM) -Coumadin held, INR supratherapeutic -Heparin to start when INR <2  Diabetes mellitus, type II -Metformin held, continued findings for CBG monitoring  Hyperlipidemia -Continue statin  Chronic Anemia -Continue iron supplementation -Baseline hemoglobin approximately 10, currently 10.9  Hypokalemia -Secondary to diuretics -Will replace and continue to monitor -Magnesium level ordered  DVT Prophylaxis  Heparin when INR <2, was on coumadin  Code Status: Full  Family Communication: None at bedside  Disposition Plan: Admitted. Continue to monitor closely, pending further recommendations from cardiology   Consultants Cardiology   Procedures  Echocardiogram  Antibiotics   Anti-infectives    Start     Dose/Rate Route Frequency Ordered Stop   01/01/16 0500  vancomycin (VANCOCIN) IVPB 1000 mg/200 mL premix     1,000 mg 200 mL/hr over 60 Minutes Intravenous Every 24 hours 12/31/15 0440 01/08/16 0459   12/31/15 1400  aztreonam (AZACTAM) 1 g in dextrose 5 % 50 mL IVPB     1 g 100 mL/hr over 30 Minutes Intravenous Every 8 hours 12/31/15 0440 01/08/16 1359   12/31/15 0600   aztreonam (  AZACTAM) 2 g in dextrose 5 % 50 mL IVPB  Status:  Discontinued     2 g 100 mL/hr over 30 Minutes Intravenous Every 8 hours 12/31/15 0428 12/31/15 0435   12/31/15 0130  aztreonam (AZACTAM) 1 g in dextrose 5 % 50 mL IVPB     1 g 100 mL/hr over 30 Minutes Intravenous  Once 12/31/15 0121 12/31/15 0338   12/31/15 0130  vancomycin (VANCOCIN) IVPB 1000 mg/200 mL premix     1,000 mg 200 mL/hr over 60 Minutes Intravenous NOW 12/31/15 0122 12/31/15 0247      Subjective:   Shannon [name] seen and examined today.  Patient feels her breathing has really improved. Denies any chest pain, abdominal pain, nausea or vomiting, dizziness or headache.   Objective:   Filed Vitals:   01/01/16 1719 01/01/16 2025 01/01/16 2333 01/02/16 0410  BP: 107/81 91/68 106/73 92/58  Pulse: 112 84 86 107  Temp:  97.6 F (36.4 C)  97.5 F (36.4 C)  TempSrc:  Oral  Oral  Resp:  18 18 17   Height:      Weight:    70.761 kg (156 lb)  SpO2:  94% 98% 96%    Intake/Output Summary (Last 24 hours) at 01/02/16 1015 Last data filed at 01/02/16 0813  Gross per 24 hour  Intake    290 ml  Output   3100 ml  Net  -2810 ml   Filed Weights   12/31/15 0303 01/01/16 0440 01/02/16 0410  Weight: 74.844 kg (165 lb) 73.528 kg (162 lb 1.6 oz) 70.761 kg (156 lb)    Exam  General: Well developed, well nourished, no distress  HEENT: NCAT, mucous membranes moist.   Neck: Supple, + JVD, no masses  Cardiovascular: S1 S2 auscultated, Irregular  Respiratory: Diminshed but clear with equal chest rise, no wheezing.  Abdomen: Soft, nontender, nondistended, + bowel sounds  Extremities: warm dry without cyanosis clubbing. LE Edema.  Neuro: AAOx3, nonfocal  Psych: Normal affect and demeanor, pleasant   Data Reviewed: I have personally reviewed following labs and imaging studies  CBC:  Recent Labs Lab 12/27/15 1708 12/30/15 2327 12/31/15 0525 01/02/16 0306  WBC 7.9 7.6 8.1 6.7  NEUTROABS 6794  --  6.3   --   HGB 12.0 11.5* 11.7* 10.9*  HCT 36.8 37.4 37.0 35.1*  MCV 101.9* 104.8* 103.9* 103.8*  PLT 259 273 263 [id number]   Basic Metabolic Panel:  Recent Labs Lab 12/27/15 1708 12/30/15 2327 12/31/15 0525 01/01/16 0248 01/02/16 0306  NA 138 138 136 137 136  K 4.3 4.3 3.5 3.4* 3.3*  CL 99 105 101 103 102  CO2 26 23 23 26 27   GLUCOSE 103* 143* 138* 129* 123*  BUN 20 22* 20 19 18   CREATININE 1.00* 1.07* 0.99 0.99 0.93  CALCIUM 8.7 8.8* 8.6* 8.4* 8.1*   GFR: Estimated Creatinine Clearance: 43 mL/min (by C-G formula based on Cr of 0.93). Liver Function Tests:  Recent Labs Lab 12/27/15 1708 12/30/15 2327 12/31/15 0525  AST 30 39 34  ALT 27 28 27   ALKPHOS 211* 202* 197*  BILITOT 1.1 1.3* 1.2  PROT 6.0* 5.9* 6.0*  ALBUMIN 3.2* 2.7* 2.5*    Recent Labs Lab 12/30/15 2327 12/31/15 0525  LIPASE 41 38   No results for input(s): AMMONIA in the last 168 hours. Coagulation Profile:  Recent Labs Lab 12/30/15 1454 12/30/15 2327 01/01/16 0248 01/02/16 0306  INR 4.0 3.35* 3.02* 2.83*   Cardiac Enzymes:  Recent Labs Lab 12/30/15  2346 12/31/15 0525 12/31/15 1022 12/31/15 1614  TROPONINI 0.10* 0.07* 0.06* 0.05*   BNP (last 3 results) No results for input(s): PROBNP in the last 8760 hours. HbA1C: No results for input(s): HGBA1C in the last 72 hours. CBG:  Recent Labs Lab 01/01/16 0609 01/01/16 1130 01/01/16 1627 01/01/16 2110 01/02/16 0614  GLUCAP 161* 179* 152* 123* 129*   Lipid Profile: No results for input(s): CHOL, HDL, LDLCALC, TRIG, CHOLHDL, LDLDIRECT in the last 72 hours. Thyroid Function Tests: No results for input(s): TSH, T4TOTAL, FREET4, T3FREE, THYROIDAB in the last 72 hours. Anemia Panel: No results for input(s): VITAMINB12, FOLATE, FERRITIN, TIBC, IRON, RETICCTPCT in the last 72 hours. Urine analysis:    Component Value Date/Time   COLORURINE YELLOW 12/30/2015 2320   APPEARANCEUR CLOUDY* 12/30/2015 2320   LABSPEC 1.020 12/30/2015 2320    PHURINE 5.0 12/30/2015 2320   GLUCOSEU NEGATIVE 12/30/2015 2320   HGBUR NEGATIVE 12/30/2015 2320   BILIRUBINUR NEGATIVE 12/30/2015 2320   KETONESUR NEGATIVE 12/30/2015 2320   PROTEINUR 30* 12/30/2015 2320   UROBILINOGEN 0.2 10/09/2012 1551   NITRITE NEGATIVE 12/30/2015 2320   LEUKOCYTESUR SMALL* 12/30/2015 2320   Sepsis Labs: @LABRCNTIP (procalcitonin:4,lacticidven:4)  )No results found for this or any previous visit (from the past 240 hour(s)).    Radiology Studies: No results found.   Scheduled Meds: . amiodarone  200 mg Oral Daily  . aspirin EC  81 mg Oral Daily  . atorvastatin  20 mg Oral Daily  . aztreonam  1 g Intravenous Q8H  . ferrous sulfate  325 mg Oral Q breakfast  . furosemide  40 mg Intravenous BID  . insulin aspart  0-9 Units Subcutaneous TID WC  . metoprolol succinate  25 mg Oral BID  . potassium chloride  10 mEq Oral Daily  . saccharomyces boulardii  250 mg Oral BID  . sacubitril-valsartan  1 tablet Oral BID  . sodium chloride flush  3 mL Intravenous Q12H  . vancomycin  1,000 mg Intravenous Q24H   Continuous Infusions:    LOS: 2 days   Time Spent in minutes   30 minutes  Garnette Greb D.O. on 01/02/2016 at 10:15 AM  Between 7am to 7pm - Pager - [phone]  After 7pm go to [email or web address] - password [email or web address]  And look for the night coverage person covering for me after hours  [facility] Office  [phone]

## 2016-01-02 NOTE — Clinical Documentation Improvement (Signed)
Hospitalist and/or Consultants  Please document query responses in the progress notes and discharge summary, not on the CDI BPA form in [facility]. Thank you!  "Non-STEMI (non-ST elevated myocardial infarction)" is documented by Dr. Christy Gentles in the ED provider note 12/31/15 at 0138.  Please clarify and document if the diagnosis of "Non-STEMI (non-ST elevated myocardial infarction)" is:  - ruled out and not applicable to this admission  - ruled in and is applicable to this admission  - unable to clinically determine  Clinical Information: Echo 12/31/2015 showed EF of 25% with global hypokinesis  Patient has a known history of CAD with a history of stenting 2007 and lateral infarction per Cardiology consult Troponin trend this admission Component     Latest Ref Rng 12/30/2015 12/31/2015 12/31/2015 12/31/2015          5:25 AM 10:22 AM  4:14 PM  Troponin I     <0.03 ng/mL 0.10 (HH) 0.07 (HH) 0.06 (HH) 0.05 (HH)    Please exercise your independent, professional judgment when responding. A specific answer is not anticipated or expected.   Thank You, Erling Conte  RN BSN CCDS [phone] Health Information Management [place]

## 2016-01-02 NOTE — Progress Notes (Signed)
VASCULAR LAB PRELIMINARY  PRELIMINARY  PRELIMINARY  PRELIMINARY  Bilateral lower extremity venous duplex completed.    Preliminary report:  There is no obvious evidence of DVT or SVT noted in the visualized veins of the bilateral lower extremities.    Yatzil Clippinger, RVT 01/02/2016, 3:04 PM

## 2016-01-02 NOTE — Telephone Encounter (Signed)
New Message:   She wanted Dr Acie Fredrickson to know that pt was admitted to [facility] on 12-31-15.

## 2016-01-02 NOTE — Progress Notes (Addendum)
TELEMETRY: Reviewed telemetry pt in atrial fibrillation with rate 105-110.: Filed Vitals:   01/01/16 1719 01/01/16 2025 01/01/16 2333 01/02/16 0410  BP: 107/81 91/68 106/73 92/58  Pulse: 112 84 86 107  Temp:  97.6 F (36.4 C)  97.5 F (36.4 C)  TempSrc:  Oral  Oral  Resp:  18 18 17   Height:      Weight:    156 lb (70.761 kg)  SpO2:  94% 98% 96%    Intake/Output Summary (Last 24 hours) at 01/02/16 1025 Last data filed at 01/02/16 0813  Gross per 24 hour  Intake    290 ml  Output   3100 ml  Net  -2810 ml   Filed Weights   12/31/15 0303 01/01/16 0440 01/02/16 0410  Weight: 165 lb (74.844 kg) 162 lb 1.6 oz (73.528 kg) 156 lb (70.761 kg)    Subjective Feels better. Presented with marked weakness and dyspnea for 3 weeks. Breathing is better. Less edema.   [name] amiodarone  200 mg Oral Daily  . aspirin EC  81 mg Oral Daily  . atorvastatin  20 mg Oral Daily  . aztreonam  1 g Intravenous Q8H  . ferrous sulfate  325 mg Oral Q breakfast  . furosemide  40 mg Intravenous BID  . insulin aspart  0-9 Units Subcutaneous TID WC  . metoprolol succinate  25 mg Oral BID  . potassium chloride  10 mEq Oral Daily  . potassium chloride  40 mEq Oral Once  . saccharomyces boulardii  250 mg Oral BID  . sacubitril-valsartan  1 tablet Oral BID  . sodium chloride flush  3 mL Intravenous Q12H  . vancomycin  1,000 mg Intravenous Q24H      LABS: Basic Metabolic Panel:  Recent Labs  01/01/16 0248 01/02/16 0306  NA 137 136  K 3.4* 3.3*  CL 103 102  CO2 26 27  GLUCOSE 129* 123*  BUN 19 18  CREATININE 0.99 0.93  CALCIUM 8.4* 8.1*   Liver Function Tests:  Recent Labs  12/30/15 2327 12/31/15 0525  AST 39 34  ALT 28 27  ALKPHOS 202* 197*  BILITOT 1.3* 1.2  PROT 5.9* 6.0*  ALBUMIN 2.7* 2.5*    Recent Labs  12/30/15 2327 12/31/15 0525  LIPASE 41 38   CBC:  Recent Labs  12/31/15 0525 01/02/16 0306  WBC 8.1 6.7  NEUTROABS 6.3  --   HGB 11.7* 10.9*  HCT 37.0 35.1*  MCV  103.9* 103.8*  PLT 263 216   Cardiac Enzymes:  Recent Labs  12/31/15 0525 12/31/15 1022 12/31/15 1614  TROPONINI 0.07* 0.06* 0.05*   BNP: No results for input(s): PROBNP in the last 72 hours. D-Dimer:  Recent Labs  01/01/16 1124  DDIMER 2.18*   Hemoglobin A1C: No results for input(s): HGBA1C in the last 72 hours. Fasting Lipid Panel: No results for input(s): CHOL, HDL, LDLCALC, TRIG, CHOLHDL, LDLDIRECT in the last 72 hours. Thyroid Function Tests: No results for input(s): TSH, T4TOTAL, T3FREE, THYROIDAB in the last 72 hours.  Invalid input(s): FREET3   Radiology/Studies:   CLINICAL DATA: Dyspnea, cough and weakness for 3 months.  EXAM: CHEST 2 VIEW  COMPARISON: 11/16/2015  FINDINGS: There is unchanged moderate cardiomegaly and aortic tortuosity. There is focal airspace consolidation in the left base, as well as curvilinear atelectatic appearing opacities in the lateral lungs. Probable small effusions bilaterally.  IMPRESSION: Airspace opacities and effusions bilaterally. This may represent pneumonia.   Electronically Signed  By: Andreas Newport  M.D.  On: 12/31/2015 00:51    CLINICAL DATA: Abdominal pain and nausea for 3 days  EXAM: CT ABDOMEN AND PELVIS WITHOUT CONTRAST  TECHNIQUE: Multidetector CT imaging of the abdomen and pelvis was performed following the standard protocol without IV contrast.  COMPARISON: None.  FINDINGS: There is small to moderate volume ascites. There are unremarkable unenhanced appearances of the liver, spleen, pancreas and adrenals. There are several calculi in the gallbladder lumen measuring up to 4 mm. Kidneys are unremarkable. Urinary bladder is unremarkable. Uterus and adnexal structures are unremarkable. Bowel is unremarkable. Appendix is normal. Enteric contrast has reached the rectum.  There is a large right effusion and a moderate left effusion. There is mild patchy and linear opacity  in both lung bases which may be atelectatic but infectious infiltrates cannot be excluded.  No significant skeletal lesion is evident.  The abdominal aorta is normal in caliber with moderate atherosclerotic calcification. Extensive coronary artery calcified plaque.  IMPRESSION: 1. Small to moderate volume ascites. 2. Cholelithiasis. 3. Negative for bowel obstruction, perforation or focal inflammatory change. 4. Bilateral pleura effusions, right greater than left. Lung base opacities may be atelectatic, but infectious infiltrates cannot be excluded.   Electronically Signed  By: Andreas Newport M.D.  On: 12/31/2015 06:28   Echo: Study Conclusions  - Left ventricle: The cavity size was normal. Wall thickness was  normal. Systolic function was severely reduced. The estimated  ejection fraction was in the range of 20% to 25%. Diffuse  hypokinesis. - Aortic valve: There was mild regurgitation. - Mitral valve: Calcified annulus. Mildly thickened leaflets .  There was mild regurgitation. - Left atrium: The atrium was moderately dilated. - Right ventricle: The cavity size was mildly dilated. Systolic  function was mildly reduced. - Right atrium: The atrium was severely dilated. - Tricuspid valve: There was severe regurgitation. - Pulmonary arteries: Systolic pressure was severely increased. PA  peak pressure: 64 mm Hg (S). - Pericardium, extracardiac: A small pericardial effusion was  identified. There was a left pleural effusion.  Impressions:  - Severe global reduction in LV function; biatrial enlargement;  mild RVE with mildly reduced function; small pericardial  effusion; mild AI and MR; severe TR with severely elevated  pulmonary pressure.  PHYSICAL EXAM General: Well developed, well nourished, in no acute distress. Head: Normal Neck: Negative for carotid bruits. JVD is elevated. No adenopathy Lungs: Bibasilar rales. Heart: IRRR S1 S2 with  2/6 systolic murmur LLSB Abdomen: Soft, non-tender, non-distended with normoactive bowel sounds. No hepatomegaly. No rebound/guarding. No obvious abdominal masses. Msk:  Strength and tone appears normal for age. Extremities: 2+ edema.  Distal pedal pulses are 2+ and equal bilaterally. Neuro: Alert and oriented X 3. Moves all extremities spontaneously. Psych:  Responds to questions appropriately with a normal affect.  ASSESSMENT AND PLAN: 1. Acute on chronic systolic CHF with marked drop in EF compared to October 2016. Diuresing well. On metoprolol, Entresto. BP soft so unable to titrate meds further. Continue IV lasix. I/O negative 2.7 liters since admit. Weight down ? 9 lbs. Will need ischemic evaluation once INR has decreased to less than 1.7.  2. CAD s/p stenting of OM. Plan cardiac cath later this week depending on INR. Mild troponin elevation suggests more demand ischemia from CHF. 3. Diabetes with ulceration of toe 4. Hyperlipidemia. On statin 5. Persistent afib since July 2016. On amiodarone and metoprolol for rate control. Coumadin on hold for planned invasive procedure. IV heparin per pharmacy.   Present on Admission:  [name] (  Resolved) HCAP (healthcare-associated pneumonia) . Acute respiratory failure with hypoxia ([place]) . Chronic atrial fibrillation ([place]) . CAD (coronary artery disease)  Signed, Exodus Kutzer [place], [place] 01/02/2016 10:25 AM

## 2016-01-02 NOTE — Progress Notes (Signed)
Occupational Therapy Evaluation Patient Details Name: Shannon [name] MRN: [id number] DOB: April 30, 1932 Today's Date: 01/02/2016    History of Present Illness Shannon [name] is a 82 y.o. female with CAD status post stenting, CHF, paroxysmal atrial fibrillation, diabetes mellitus type 2, hyperlipidemia who was admitted due to acute hypoxic respiratory faiilure with new cardiomyopathy with acute on chronic CHF.   Clinical Impression   PTA, pt required assistance for LB ADLs and used RW for mobility. Pt currently requires min assist for LB ADLs and min guard assist for basic transfers. On OT arrival, pt laying in bed and tachycardic (HR 115, SpO2 92% on 2L) - sats remained the same during activity. Pt plans to d/c to her son's house where she will have 24/7 supervision. Pt will benefit from continued acute OT to increase independence and safety with ADLs and mobility to allow for safe discharge home. Recommend HHOT and 3in1 for home use.    Follow Up Recommendations  Home health OT;Supervision/Assistance - 24 hour    Equipment Recommendations  3 in 1 bedside comode    Recommendations for Other Services       Precautions / Restrictions Precautions Precautions: Fall Precaution Comments: O2 dep Restrictions Weight Bearing Restrictions: No      Mobility Bed Mobility Overal bed mobility: Needs Assistance Bed Mobility: Supine to Sit     Supine to sit: Supervision     General bed mobility comments: HOB elevated, use of bedrails. Sueprvision for safety and O2 management  Transfers Overall transfer level: Needs assistance Equipment used: None Transfers: Sit to/from [facility] Sit to Stand: Min guard Stand pivot transfers: Min guard       General transfer comment: Min guard assist for balance and safety. Cues for safe hand placement and O2 management.    Balance Overall balance assessment: Needs assistance Sitting-balance support: No upper extremity  supported;Feet supported Sitting balance-Leahy Scale: Good     Standing balance support: No upper extremity supported;During functional activity Standing balance-Leahy Scale: Fair Standing balance comment: able to maintain balance without UE support to complete stand-pivot transfer and pericare                            ADL Overall ADL's : Needs assistance/impaired Eating/Feeding: Set up;Sitting   Grooming: Wash/dry hands;Set up;Sitting   Upper Body Bathing: Set up;Sitting   Lower Body Bathing: Minimal assistance;Sit to/from stand   Upper Body Dressing : Set up;Sitting   Lower Body Dressing: Minimal assistance;Sit to/from stand   Toilet Transfer: Min guard;Cueing for [employer]- Clothing Manipulation and Hygiene: Set up;Sitting/lateral lean       Functional mobility during ADLs: Min guard       Vision Vision Assessment?: No apparent visual deficits   Perception     Praxis      Pertinent Vitals/Pain Pain Assessment: No/denies pain     Hand Dominance Right   Extremity/Trunk Assessment Upper Extremity Assessment Upper Extremity Assessment: Generalized weakness (history of arthritis and neuropathy)   Lower Extremity Assessment Lower Extremity Assessment: Generalized weakness (history of arthritis and neuropathy)   Cervical / Trunk Assessment Cervical / Trunk Assessment: Kyphotic   Communication Communication Communication: No difficulties   Cognition Arousal/Alertness: Awake/alert Behavior During Therapy: WFL for tasks assessed/performed Overall Cognitive Status: Within Functional Limits for tasks assessed                     General Comments  Exercises       Shoulder Instructions      Home Living Family/patient expects to be discharged to:: Private residence Living Arrangements: Children (son) Available Help at Discharge: Family;Available 24 hours/day Type of Home: House Home Access: Stairs to  enter [employer] of Steps: 2 Entrance Stairs-Rails: None Home Layout: One level     Bathroom Shower/Tub: Tub/shower unit Shower/tub characteristics: Curtain Biochemist, clinical: Standard     Home Equipment: Environmental consultant - 2 wheels;Cane - single point;Shower seat   Additional Comments: has been staying with her son since last admit with daughter and step daughter helping care for her spouse at her home, but recently went back home and had started taking care of her spouse again (who is bed bound with Alzheimers.)      Prior Functioning/Environment Level of Independence: Needs assistance  Gait / Transfers Assistance Needed: Uses RW  ADL's / Homemaking Assistance Needed: Assist with LB ADLs and IADLs        OT Diagnosis: Generalized weakness   OT Problem List: Decreased strength;Decreased range of motion;Impaired balance (sitting and/or standing);Decreased activity tolerance;Decreased safety awareness;Decreased knowledge of use of DME or AE;Cardiopulmonary status limiting activity;Impaired sensation   OT Treatment/Interventions: Therapeutic exercise;Self-care/ADL training;Energy conservation;DME and/or AE instruction;Therapeutic activities;Patient/family education;Balance training    OT Goals(Current goals can be found in the care plan section) Acute Rehab OT Goals Patient Stated Goal: To return home as independent as possible OT Goal Formulation: With patient Time For Goal Achievement: 01/16/16 Potential to Achieve Goals: Good ADL Goals Pt Will Perform Grooming: with modified independence;standing Pt Will Perform Upper Body Bathing: with modified independence;sitting Pt Will Perform Lower Body Bathing: with modified independence;with adaptive equipment;sit to/from stand Pt Will Transfer to Toilet: with modified independence;ambulating;bedside commode (over toilet) Pt Will Perform Toileting - Clothing Manipulation and hygiene: with modified independence;sitting/lateral  leans;sit to/from stand Additional ADL Goal #1: Pt will utilize 2 energy conservation strategies during ADL tasks with min verbal cues.  OT Frequency: Min 3X/week   Barriers to D/C:            Co-evaluation              End of Session Equipment Utilized During Treatment: Gait belt;Rolling walker;Oxygen Nurse Communication: Mobility status  Activity Tolerance: Patient tolerated treatment well Patient left: Other (comment) (sitting EOB to eat dinner, RN notified)   [id number] OT Time Calculation (min): 27 min Charges:  OT General Charges $OT Visit: 1 Procedure OT Evaluation $OT Eval Moderate Complexity: 1 Procedure OT Treatments $Self Care/Home Management : 8-22 mins G-Codes:    Redmond Baseman, OTR/L Pager: [phone] 01/02/2016, 5:09 PM

## 2016-01-03 DIAGNOSIS — R0602 Shortness of breath: Secondary | ICD-10-CM | POA: Insufficient documentation

## 2016-01-03 DIAGNOSIS — M7989 Other specified soft tissue disorders: Secondary | ICD-10-CM | POA: Insufficient documentation

## 2016-01-03 LAB — BASIC METABOLIC PANEL
Anion gap: 6 (ref 5–15)
BUN: 16 mg/dL (ref 6–20)
CHLORIDE: 102 mmol/L (ref 101–111)
CO2: 29 mmol/L (ref 22–32)
CREATININE: 0.81 mg/dL (ref 0.44–1.00)
Calcium: 7.9 mg/dL — ABNORMAL LOW (ref 8.9–10.3)
Glucose, Bld: 141 mg/dL — ABNORMAL HIGH (ref 65–99)
Potassium: 3.4 mmol/L — ABNORMAL LOW (ref 3.5–5.1)
SODIUM: 137 mmol/L (ref 135–145)

## 2016-01-03 LAB — GLUCOSE, CAPILLARY
GLUCOSE-CAPILLARY: 192 mg/dL — AB (ref 65–99)
GLUCOSE-CAPILLARY: 143 mg/dL — AB (ref 65–99)
GLUCOSE-CAPILLARY: 204 mg/dL — AB (ref 65–99)
Glucose-Capillary: 165 mg/dL — ABNORMAL HIGH (ref 65–99)

## 2016-01-03 LAB — CBC
HCT: 36.9 % (ref 36.0–46.0)
Hemoglobin: 12.1 g/dL (ref 12.0–15.0)
MCH: 33.8 pg (ref 26.0–34.0)
MCHC: 32.8 g/dL (ref 30.0–36.0)
MCV: 103.1 fL — ABNORMAL HIGH (ref 78.0–100.0)
PLATELETS: 257 10*3/uL (ref 150–400)
RBC: 3.58 MIL/uL — AB (ref 3.87–5.11)
RDW: 17.9 % — AB (ref 11.5–15.5)
WBC: 9.2 10*3/uL (ref 4.0–10.5)

## 2016-01-03 LAB — PROTIME-INR
INR: 2.85 — ABNORMAL HIGH (ref 0.00–1.49)
PROTHROMBIN TIME: 29.5 s — AB (ref 11.6–15.2)

## 2016-01-03 LAB — STREP PNEUMONIAE URINARY ANTIGEN: STREP PNEUMO URINARY ANTIGEN: NEGATIVE

## 2016-01-03 MED ORDER — MAGNESIUM SULFATE 2 GM/50ML IV SOLN
2.0000 g | Freq: Once | INTRAVENOUS | Status: AC
  Administered 2016-01-03: 2 g via INTRAVENOUS
  Filled 2016-01-03: qty 50

## 2016-01-03 MED ORDER — METOPROLOL SUCCINATE ER 25 MG PO TB24
37.5000 mg | ORAL_TABLET | Freq: Two times a day (BID) | ORAL | Status: DC
  Administered 2016-01-03 – 2016-01-04 (×4): 37.5 mg via ORAL
  Filled 2016-01-03 (×5): qty 2

## 2016-01-03 MED ORDER — POTASSIUM CHLORIDE CRYS ER 20 MEQ PO TBCR
40.0000 meq | EXTENDED_RELEASE_TABLET | Freq: Two times a day (BID) | ORAL | Status: AC
  Administered 2016-01-03 (×2): 40 meq via ORAL
  Filled 2016-01-03 (×2): qty 2

## 2016-01-03 NOTE — Progress Notes (Signed)
PROGRESS NOTE    Shannon [name]  [id number] DOB: [birth year] DOA: 12/30/2015 PCP: Haywood Pao, MD   Brief Narrative:  HPI on 12/31/2015 by Dr. Gean Birchwood Shannon [name] is a 82 y.o. female with CAD status post stenting, CHF, paroxysmal atrial fibrillation, diabetes mellitus type 2, hyperlipidemia was brought to the ER after patient was complaining of abdominal pain and shortness of breath. Patient's abdominal pain started last evening which was generalized and by the time patient reached ER pain has largely resolved. Patient has recently followed up with her cardiologist and at that time and complained of weakness poor appetite and as per the patient's daughter lipase was elevated during the lab works done at the cardiologist's office. Patient was found be short of breath and states she has been having some nonproductive cough for last 1 week and also has gained 30 pounds over the last 3 months. Denies any chest pain. Patient takes Lasix 20 mg daily. On exam patient also has bilateral lower extremity edema and wound of the left great toe. Chest x-ray shows features concerning for pneumonia and BNP is elevated. Troponin is mildly elevated. Patient is on Coumadin. Patient at this time will be admitted for acute respiratory failure probably from CHF and pneumonia and abdominal discomfort.   Assessment & Plan   Acute respiratory failure with hypoxia/Acute systolic CHF -Improving  -Likely multi-factorial including heart failure and possible pneumonia -Upon admission to the ED, SPO2 87%, continue oxygen supplementation -BNP 593 -Chest x-ray: Airspace opacities with effusions bilaterally. -Echocardiogram 04/07/2015 showed an EF of 50-55% -Echocardiogram 01/01/2016: EF 20-25%, diffuse hypokinesis, Severe TR with severely elevated pulmonary pressure -Monitor intake and output, daily weights. UOP over past 24hrs 2600cc (net -2000) -Weight down 13lbs -Continue empiric antibiotics.  Initially placed on vancomycin and aztreonam- will deescalate to ceftriaxone (patient does have a PCN allergy- but it is unknown) -Although ddimer elevated, unlikely PE, as patient's INR has been supratherapeutic.  -LE doppler negative for DVT or SVT -Cardiology consulted and appreciated -Started on Ernesto- held today due to soft BP -Patient will need heart cath once INR is <1.7  Abdominal pain -Improved  -CT abdomen and pelvis: small to moderate volume ascites, cholelithiasis, negative for bowel obstruction, perforation or ambulatory change. Bilateral pleural effusions, right greater than left. -Continue to monitor, pain control  Coronary artery disease -Denies chest pain -Troponin mildly elevated but trending downward (0.1, 0.07, 0.06, 0.05) -Continue aspirin, statin, metoprolol  Atrial fibrillation, chronic -CHADSVASC 4 (age, gender, DM) -Coumadin held, INR supratherapeutic -Heparin to start when INR <2  Diabetes mellitus, type II -Metformin held, continued findings for CBG monitoring  Hyperlipidemia -Continue statin  Chronic Anemia -Continue iron supplementation -Baseline hemoglobin approximately 10, currently 12.1  Hypokalemia/hypomagnesemia -Secondary to diuretics -Will replace and continue to monitor -Magnesium level ordered- 1.3, will replace  DVT Prophylaxis  Heparin when INR <2, was on coumadin  Code Status: Full  Family Communication: None at bedside  Disposition Plan: Admitted. Continue to monitor closely, pending heart cath when INR <1.7  Consultants Cardiology   Procedures  Echocardiogram LE doppler  Antibiotics   Anti-infectives    Start     Dose/Rate Route Frequency Ordered Stop   01/02/16 1300  cefTRIAXone (ROCEPHIN) 2 g in dextrose 5 % 50 mL IVPB     2 g 100 mL/hr over 30 Minutes Intravenous Every 24 hours 01/02/16 1156     01/01/16 0500  vancomycin (VANCOCIN) IVPB 1000 mg/200 mL premix  Status:  Discontinued  1,000 mg 200 mL/hr over  60 Minutes Intravenous Every 24 hours 12/31/15 0440 01/02/16 1156   12/31/15 1400  aztreonam (AZACTAM) 1 g in dextrose 5 % 50 mL IVPB  Status:  Discontinued     1 g 100 mL/hr over 30 Minutes Intravenous Every 8 hours 12/31/15 0440 01/02/16 1156   12/31/15 0600  aztreonam (AZACTAM) 2 g in dextrose 5 % 50 mL IVPB  Status:  Discontinued     2 g 100 mL/hr over 30 Minutes Intravenous Every 8 hours 12/31/15 0428 12/31/15 0435   12/31/15 0130  aztreonam (AZACTAM) 1 g in dextrose 5 % 50 mL IVPB     1 g 100 mL/hr over 30 Minutes Intravenous  Once 12/31/15 0121 12/31/15 0338   12/31/15 0130  vancomycin (VANCOCIN) IVPB 1000 mg/200 mL premix     1,000 mg 200 mL/hr over 60 Minutes Intravenous NOW 12/31/15 0122 12/31/15 0247      Subjective:   Shannon [name] seen and examined today.  Patient feels her breathing has really improved. Glad her weight has decreased. Admitted to some anxiety yesterday evening. Denies any chest pain, abdominal pain, nausea or vomiting, dizziness or headache.   Objective:   Filed Vitals:   01/02/16 2113 01/02/16 2330 01/03/16 0419 01/03/16 1048  BP: 84/60 103/74 92/65 96/70   Pulse:  111 106 133  Temp:   98.8 F (37.1 C)   TempSrc:   Oral   Resp:   19   Height:      Weight:   69.174 kg (152 lb 8 oz)   SpO2:   96%     Intake/Output Summary (Last 24 hours) at 01/03/16 1052 Last data filed at 01/03/16 0955  Gross per 24 hour  Intake    483 ml  Output   2500 ml  Net  -2017 ml   Filed Weights   01/01/16 0440 01/02/16 0410 01/03/16 0419  Weight: 73.528 kg (162 lb 1.6 oz) 70.761 kg (156 lb) 69.174 kg (152 lb 8 oz)    Exam  General: Well developed, well nourished, no distress  HEENT: NCAT, mucous membranes moist.   Neck: Supple, + JVD, no masses  Cardiovascular: S1 S2 auscultated, Irregular, 2/6 SEM  Respiratory: Diminshed but clear with equal chest rise, no wheezing.  Abdomen: Soft, nontender, nondistended, + bowel sounds  Extremities: warm dry  without cyanosis clubbing. LE Edema- improving .  Neuro: AAOx3, nonfocal  Psych: Normal affect and demeanor, pleasant   Data Reviewed: I have personally reviewed following labs and imaging studies  CBC:  Recent Labs Lab 12/27/15 1708 12/30/15 2327 12/31/15 0525 01/02/16 0306 01/03/16 0302  WBC 7.9 7.6 8.1 6.7 9.2  NEUTROABS 6794  --  6.3  --   --   HGB 12.0 11.5* 11.7* 10.9* 12.1  HCT 36.8 37.4 37.0 35.1* 36.9  MCV 101.9* 104.8* 103.9* 103.8* 103.1*  PLT 259 273 263 216 [id number]   Basic Metabolic Panel:  Recent Labs Lab 12/30/15 2327 12/31/15 0525 01/01/16 0248 01/02/16 0306 01/03/16 0302  NA 138 136 137 136 137  K 4.3 3.5 3.4* 3.3* 3.4*  CL 105 101 103 102 102  CO2 23 23 26 27 29   GLUCOSE 143* 138* 129* 123* 141*  BUN 22* 20 19 18 16   CREATININE 1.07* 0.99 0.99 0.93 0.81  CALCIUM 8.8* 8.6* 8.4* 8.1* 7.9*  MG  --   --   --  1.3*  --    GFR: Estimated Creatinine Clearance: 48.8 mL/min (by C-G formula based  on Cr of 0.81). Liver Function Tests:  Recent Labs Lab 12/27/15 1708 12/30/15 2327 12/31/15 0525  AST 30 39 34  ALT 27 28 27   ALKPHOS 211* 202* 197*  BILITOT 1.1 1.3* 1.2  PROT 6.0* 5.9* 6.0*  ALBUMIN 3.2* 2.7* 2.5*    Recent Labs Lab 12/30/15 2327 12/31/15 0525  LIPASE 41 38   No results for input(s): AMMONIA in the last 168 hours. Coagulation Profile:  Recent Labs Lab 12/30/15 1454 12/30/15 2327 01/01/16 0248 01/02/16 0306 01/03/16 0302  INR 4.0 3.35* 3.02* 2.83* 2.85*   Cardiac Enzymes:  Recent Labs Lab 12/30/15 2346 12/31/15 0525 12/31/15 1022 12/31/15 1614  TROPONINI 0.10* 0.07* 0.06* 0.05*   BNP (last 3 results) No results for input(s): PROBNP in the last 8760 hours. HbA1C: No results for input(s): HGBA1C in the last 72 hours. CBG:  Recent Labs Lab 01/02/16 0614 01/02/16 1100 01/02/16 1603 01/02/16 2056 01/03/16 0602  GLUCAP 129* 207* 132* 163* 192*   Lipid Profile: No results for input(s): CHOL, HDL, LDLCALC,  TRIG, CHOLHDL, LDLDIRECT in the last 72 hours. Thyroid Function Tests: No results for input(s): TSH, T4TOTAL, FREET4, T3FREE, THYROIDAB in the last 72 hours. Anemia Panel: No results for input(s): VITAMINB12, FOLATE, FERRITIN, TIBC, IRON, RETICCTPCT in the last 72 hours. Urine analysis:    Component Value Date/Time   COLORURINE YELLOW 12/30/2015 2320   APPEARANCEUR CLOUDY* 12/30/2015 2320   LABSPEC 1.020 12/30/2015 2320   PHURINE 5.0 12/30/2015 2320   GLUCOSEU NEGATIVE 12/30/2015 2320   HGBUR NEGATIVE 12/30/2015 2320   BILIRUBINUR NEGATIVE 12/30/2015 2320   KETONESUR NEGATIVE 12/30/2015 2320   PROTEINUR 30* 12/30/2015 2320   UROBILINOGEN 0.2 10/09/2012 1551   NITRITE NEGATIVE 12/30/2015 2320   LEUKOCYTESUR SMALL* 12/30/2015 2320   Sepsis Labs: @LABRCNTIP (procalcitonin:4,lacticidven:4)  )No results found for this or any previous visit (from the past 240 hour(s)).    Radiology Studies: No results found.   Scheduled Meds: . amiodarone  200 mg Oral Daily  . aspirin EC  81 mg Oral Daily  . atorvastatin  20 mg Oral Daily  . cefTRIAXone (ROCEPHIN)  IV  2 g Intravenous Q24H  . ferrous sulfate  325 mg Oral Q breakfast  . furosemide  40 mg Intravenous BID  . insulin aspart  0-9 Units Subcutaneous TID WC  . LORazepam  0.5 mg Intravenous Once  . metoprolol succinate  37.5 mg Oral BID  . potassium chloride  10 mEq Oral Daily  . saccharomyces boulardii  250 mg Oral BID  . sodium chloride flush  3 mL Intravenous Q12H   Continuous Infusions:    LOS: 3 days   Time Spent in minutes   30 minutes  Sudie Bandel D.O. on 01/03/2016 at 10:52 AM  Between 7am to 7pm - Pager - [phone]  After 7pm go to [email or web address] - password [email or web address]  And look for the night coverage person covering for me after hours  [facility] Office  [phone]

## 2016-01-03 NOTE — Progress Notes (Signed)
ANTICOAGULATION CONSULT NOTE - INITIAL  Pharmacy Consult for  Heparin when INR <2 Indication: afib  Allergies  Allergen Reactions  . Demerol Other (See Comments)    Burning sensation all over body  . Erythromycin Nausea And Vomiting  . Penicillins Other (See Comments)    unknown  . Percocet [Oxycodone-Acetaminophen] Swelling    Patient Measurements: Height: 5\' 2"  (157.5 cm) Weight: 152 lb 8 oz (69.174 kg) IBW/kg (Calculated) : 50.1  Heparin dosing wt: 66 kg  Vital Signs: Temp: 98.8 F (37.1 C) (07/04 0419) Temp Source: Oral (07/04 0419) BP: 92/65 mmHg (07/04 0419) Pulse Rate: 106 (07/04 0419) Intake/Output from previous day: 07/03 0701 - 07/04 0700 In: 600 [P.O.:600] Out: 2600 [Urine:2600] Intake/Output from this shift: Total I/O In: 3 [I.V.:3] Out: -   Labs:  Recent Labs  01/01/16 0248 01/02/16 0306 01/03/16 0302  WBC  --  6.7 9.2  HGB  --  10.9* 12.1  PLT  --  216 257  CREATININE 0.99 0.93 0.81   Estimated Creatinine Clearance: 48.8 mL/min (by C-G formula based on Cr of 0.81). No results for input(s): VANCOTROUGH, VANCOPEAK, VANCORANDOM, GENTTROUGH, GENTPEAK, GENTRANDOM, TOBRATROUGH, TOBRAPEAK, TOBRARND, AMIKACINPEAK, AMIKACINTROU, AMIKACIN in the last 72 hours.   Microbiology: No results found for this or any previous visit (from the past 720 hour(s)).  Assessment: 82 y.o. F presents with abd pain.  She is on Coumadin PTA for afib. Admission INR 3.35. Plan to hold coumadin and start heparin bridge when INR < 2. Plans noted for cath when INR < 1.7.  -INR= 2.85  Home dose: Coumadin 1.25mg  po daily - last dose 6/29  Goal of Therapy:  Heparin level 0.3-0.7 units/ml Will monitor platelets according to protocol  Plan:  No heparin needed at this time Daily INR  Hildred Laser, Pharm D 01/03/2016 8:32 AM

## 2016-01-03 NOTE — Progress Notes (Signed)
TELEMETRY: Reviewed telemetry pt in atrial fibrillation with rate 110.: Filed Vitals:   01/02/16 2111 01/02/16 2113 01/02/16 2330 01/03/16 0419  BP: 86/60 84/60 103/74 92/65  Pulse:   111 106  Temp:    98.8 F (37.1 C)  TempSrc:    Oral  Resp:    19  Height:      Weight:    152 lb 8 oz (69.174 kg)  SpO2:    96%    Intake/Output Summary (Last 24 hours) at 01/03/16 0733 Last data filed at 01/03/16 0612  Gross per 24 hour  Intake    600 ml  Output   2600 ml  Net  -2000 ml   Filed Weights   01/01/16 0440 01/02/16 0410 01/03/16 0419  Weight: 162 lb 1.6 oz (73.528 kg) 156 lb (70.761 kg) 152 lb 8 oz (69.174 kg)    Subjective Feels better. Presented with marked weakness and dyspnea for 3 weeks. Breathing is better. Less edema. Denies dizziness although BP low since on Entresto.  [name] amiodarone  200 mg Oral Daily  . aspirin EC  81 mg Oral Daily  . atorvastatin  20 mg Oral Daily  . cefTRIAXone (ROCEPHIN)  IV  2 g Intravenous Q24H  . ferrous sulfate  325 mg Oral Q breakfast  . furosemide  40 mg Intravenous BID  . insulin aspart  0-9 Units Subcutaneous TID WC  . LORazepam  0.5 mg Intravenous Once  . metoprolol succinate  25 mg Oral BID  . potassium chloride  10 mEq Oral Daily  . saccharomyces boulardii  250 mg Oral BID  . sacubitril-valsartan  1 tablet Oral BID  . sodium chloride flush  3 mL Intravenous Q12H      LABS: Basic Metabolic Panel:  Recent Labs  01/02/16 0306 01/03/16 0302  NA 136 137  K 3.3* 3.4*  CL 102 102  CO2 27 29  GLUCOSE 123* 141*  BUN 18 16  CREATININE 0.93 0.81  CALCIUM 8.1* 7.9*  MG 1.3*  --    Liver Function Tests: No results for input(s): AST, ALT, ALKPHOS, BILITOT, PROT, ALBUMIN in the last 72 hours. No results for input(s): LIPASE, AMYLASE in the last 72 hours. CBC:  Recent Labs  01/02/16 0306 01/03/16 0302  WBC 6.7 9.2  HGB 10.9* 12.1  HCT 35.1* 36.9  MCV 103.8* 103.1*  PLT 216 257   Cardiac Enzymes:  Recent Labs  12/31/15 1022 12/31/15 1614  TROPONINI 0.06* 0.05*   BNP: No results for input(s): PROBNP in the last 72 hours. D-Dimer:  Recent Labs  01/01/16 1124  DDIMER 2.18*   Hemoglobin A1C: No results for input(s): HGBA1C in the last 72 hours. Fasting Lipid Panel: No results for input(s): CHOL, HDL, LDLCALC, TRIG, CHOLHDL, LDLDIRECT in the last 72 hours. Thyroid Function Tests: No results for input(s): TSH, T4TOTAL, T3FREE, THYROIDAB in the last 72 hours.  Invalid input(s): FREET3   Radiology/Studies:   CLINICAL DATA: Dyspnea, cough and weakness for 3 months.  EXAM: CHEST 2 VIEW  COMPARISON: 11/16/2015  FINDINGS: There is unchanged moderate cardiomegaly and aortic tortuosity. There is focal airspace consolidation in the left base, as well as curvilinear atelectatic appearing opacities in the lateral lungs. Probable small effusions bilaterally.  IMPRESSION: Airspace opacities and effusions bilaterally. This may represent pneumonia.   Electronically Signed  By: Andreas Newport M.D.  On: 12/31/2015 00:51    CLINICAL DATA: Abdominal pain and nausea for 3 days  EXAM: CT ABDOMEN AND PELVIS WITHOUT  CONTRAST  TECHNIQUE: Multidetector CT imaging of the abdomen and pelvis was performed following the standard protocol without IV contrast.  COMPARISON: None.  FINDINGS: There is small to moderate volume ascites. There are unremarkable unenhanced appearances of the liver, spleen, pancreas and adrenals. There are several calculi in the gallbladder lumen measuring up to 4 mm. Kidneys are unremarkable. Urinary bladder is unremarkable. Uterus and adnexal structures are unremarkable. Bowel is unremarkable. Appendix is normal. Enteric contrast has reached the rectum.  There is a large right effusion and a moderate left effusion. There is mild patchy and linear opacity in both lung bases which may be atelectatic but infectious infiltrates cannot be  excluded.  No significant skeletal lesion is evident.  The abdominal aorta is normal in caliber with moderate atherosclerotic calcification. Extensive coronary artery calcified plaque.  IMPRESSION: 1. Small to moderate volume ascites. 2. Cholelithiasis. 3. Negative for bowel obstruction, perforation or focal inflammatory change. 4. Bilateral pleura effusions, right greater than left. Lung base opacities may be atelectatic, but infectious infiltrates cannot be excluded.   Electronically Signed  By: Andreas Newport M.D.  On: 12/31/2015 06:28   Echo: Study Conclusions  - Left ventricle: The cavity size was normal. Wall thickness was  normal. Systolic function was severely reduced. The estimated  ejection fraction was in the range of 20% to 25%. Diffuse  hypokinesis. - Aortic valve: There was mild regurgitation. - Mitral valve: Calcified annulus. Mildly thickened leaflets .  There was mild regurgitation. - Left atrium: The atrium was moderately dilated. - Right ventricle: The cavity size was mildly dilated. Systolic  function was mildly reduced. - Right atrium: The atrium was severely dilated. - Tricuspid valve: There was severe regurgitation. - Pulmonary arteries: Systolic pressure was severely increased. PA  peak pressure: 64 mm Hg (S). - Pericardium, extracardiac: A small pericardial effusion was  identified. There was a left pleural effusion.  Impressions:  - Severe global reduction in LV function; biatrial enlargement;  mild RVE with mildly reduced function; small pericardial  effusion; mild AI and MR; severe TR with severely elevated  pulmonary pressure.  PHYSICAL EXAM General: Well developed, well nourished, in no acute distress. Head: Normal Neck: Negative for carotid bruits. JVD is elevated. No adenopathy Lungs: Bibasilar rales. Heart: IRRR S1 S2 with 2/6 systolic murmur LLSB Abdomen: Soft, non-tender, non-distended with normoactive  bowel sounds. No hepatomegaly. No rebound/guarding. No obvious abdominal masses. Msk:  Strength and tone appears normal for age. Extremities: 2+ edema.  Distal pedal pulses are 2+ and equal bilaterally. Neuro: Alert and oriented X 3. Moves all extremities spontaneously. Psych:  Responds to questions appropriately with a normal affect.  ASSESSMENT AND PLAN: 1. Acute on chronic systolic CHF with marked drop in EF compared to October 2016. EF 20-25%. Diuresing well. BP too low. Will hold Entresto. Increase metoprolol to 37.5 mg bid.  Continue IV lasix. I/O negative 4.4 liters since admit. Weight down 13 lbs. Dry weight less than 150 lbs. Will need ischemic evaluation once INR has decreased to less than 1.7.  Today 2.84. May be able to resume Entresto when on stable po dose of lasix.  2. CAD s/p stenting of OM. Plan cardiac cath later this week depending on INR. Mild troponin elevation suggests more demand ischemia from CHF. 3. Diabetes with ulceration of toe 4. Hyperlipidemia. On statin 5. Persistent afib since July 2016. On amiodarone and metoprolol for rate control. Coumadin on hold for planned invasive procedure. IV heparin per pharmacy.   Present on  Admission:  . (Resolved) HCAP (healthcare-associated pneumonia) . Acute respiratory failure with hypoxia ([place]) . Chronic atrial fibrillation ([place]) . CAD (coronary artery disease)  Signed, Lazarus [place], [place] 01/03/2016 7:33 AM

## 2016-01-03 NOTE — Care Management Note (Addendum)
Case Management Note  Patient Details  Name: Shannon [name] MRN: [id number] Date of Birth: 06-05-1932  Subjective/Objective: 82 yo F admitted for acute respiratory failure probably from CHF and pneumonia and abdominal discomfort.                 Action/Plan: CM referral - heart failure HH screen   Expected Discharge Date:                  Expected Discharge Plan:  [name]  In-House Referral:     Discharge planning Services  CM Consult  Post Acute Care Choice:    Choice offered to:     DME Arranged:    DME Agency:     HH Arranged:    HH Agency:     Status of Service:  In process, will continue to follow  If discussed at Long Length of Stay Meetings, dates discussed:    Additional Comments: attempted to meet with pt, but she is asleep. No family members in the room. PT is recommending HHPT. Pt is not ready to be d/c. She needs a heart cath once INR <1.7. Will continue to f/u to assist with [facility] needs and home O2 if prn.  Norina Buzzard, RN 01/03/2016, 1:48 PM

## 2016-01-03 NOTE — Progress Notes (Signed)
Held B.P. meds for low B.P. 86/60

## 2016-01-04 ENCOUNTER — Encounter (HOSPITAL_COMMUNITY): Payer: Self-pay | Admitting: Student

## 2016-01-04 LAB — GLUCOSE, CAPILLARY
GLUCOSE-CAPILLARY: 128 mg/dL — AB (ref 65–99)
GLUCOSE-CAPILLARY: 140 mg/dL — AB (ref 65–99)
Glucose-Capillary: 126 mg/dL — ABNORMAL HIGH (ref 65–99)
Glucose-Capillary: 144 mg/dL — ABNORMAL HIGH (ref 65–99)

## 2016-01-04 LAB — CBC
HCT: 40 % (ref 36.0–46.0)
Hemoglobin: 12.5 g/dL (ref 12.0–15.0)
MCH: 32.7 pg (ref 26.0–34.0)
MCHC: 31.3 g/dL (ref 30.0–36.0)
MCV: 104.7 fL — AB (ref 78.0–100.0)
PLATELETS: 227 10*3/uL (ref 150–400)
RBC: 3.82 MIL/uL — ABNORMAL LOW (ref 3.87–5.11)
RDW: 18 % — AB (ref 11.5–15.5)
WBC: 10.6 10*3/uL — ABNORMAL HIGH (ref 4.0–10.5)

## 2016-01-04 LAB — BASIC METABOLIC PANEL
Anion gap: 7 (ref 5–15)
BUN: 17 mg/dL (ref 6–20)
CALCIUM: 8.1 mg/dL — AB (ref 8.9–10.3)
CO2: 31 mmol/L (ref 22–32)
Chloride: 101 mmol/L (ref 101–111)
Creatinine, Ser: 0.86 mg/dL (ref 0.44–1.00)
GFR calc Af Amer: >60 mL/min (ref >60)
GLUCOSE: 169 mg/dL — AB (ref 65–99)
Potassium: 4.2 mmol/L (ref 3.5–5.1)
Sodium: 139 mmol/L (ref 135–145)

## 2016-01-04 LAB — PROTIME-INR
INR: 2.38 — ABNORMAL HIGH (ref 0.00–1.49)
Prothrombin Time: 25.8 seconds — ABNORMAL HIGH (ref 11.6–15.2)

## 2016-01-04 LAB — MAGNESIUM: Magnesium: 1.7 mg/dL (ref 1.7–2.4)

## 2016-01-04 LAB — LEGIONELLA PNEUMOPHILA SEROGP 1 UR AG: L. PNEUMOPHILA SEROGP 1 UR AG: NEGATIVE

## 2016-01-04 MED ORDER — POTASSIUM CHLORIDE CRYS ER 20 MEQ PO TBCR: 40.0000 meq | EXTENDED_RELEASE_TABLET | Freq: Two times a day (BID) | ORAL | Status: DC

## 2016-01-04 MED ORDER — POTASSIUM CHLORIDE CRYS ER 10 MEQ PO TBCR
10.0000 meq | EXTENDED_RELEASE_TABLET | Freq: Every day | ORAL | Status: DC
  Administered 2016-01-04 – 2016-01-08 (×5): 10 meq via ORAL
  Filled 2016-01-04 (×5): qty 1

## 2016-01-04 NOTE — Progress Notes (Signed)
Occupational Therapy Treatment Patient Details Name: Shannon [name] MRN: [id number] DOB: [birth year] Today's Date: 01/04/2016    History of present illness Shannon [name] is a 82 y.o. female with CAD status post stenting, CHF, paroxysmal atrial fibrillation, diabetes mellitus type 2, hyperlipidemia who was admitted due to acute hypoxic respiratory faiilure with new cardiomyopathy with acute on chronic CHF.   OT comments  Patient making good progress towards OT goals, continue plan of care for now. Pt willing and eager to work with therapist. After sink level ADL tasks vitals = 02 sats on 1L/min=91%, HR=125, BP=95/72. During grooming tasks, pt with light dizziness, but this got better with time. Pt reports that she is going to her son and daughter-in-laws house so she will have 24/7 supervision/assistance.    Follow Up Recommendations  Home health OT;Supervision/Assistance - 24 hour    Equipment Recommendations  3 in 1 bedside comode    Recommendations for Other Services  None at this time   Precautions / Restrictions Precautions Precautions: Fall Precaution Comments: O2 use and monitor vitals with therapy Restrictions Weight Bearing Restrictions: No    Mobility Bed Mobility General bed mobility comments: up when OT entered  Transfers Overall transfer level: Needs assistance Equipment used: Rolling walker (2 wheeled) Transfers: Sit to/from Stand Sit to Stand: Supervision;Min guard Stand pivot transfers: Min guard       General transfer comment: Supervision to Min guard assist for balance and safety. Cues for safe hand placement and O2 management.    Balance Overall balance assessment: Needs assistance Sitting-balance support: No upper extremity supported;Feet supported Sitting balance-Leahy Scale: Good     Standing balance support: No upper extremity supported;During functional activity Standing balance-Leahy Scale: Fair Standing balance comment: Able to stand at  sink with no UE support for grooming tasks with S to min guard assist   ADL Overall ADL's : Needs assistance/impaired Eating/Feeding: Set up;Sitting   Grooming: Wash/dry hands;Wash/dry face;Brushing hair;Standing;Cueing for safety;Min guard   Upper Body Bathing: Set up;Sitting   Lower Body Bathing: Minimal assistance;Sit to/from stand   Upper Body Dressing : Set up;Sitting   Lower Body Dressing: Minimal assistance;Sit to/from stand   Toilet Transfer: Supervision/safety;RW;BSC;Ambulation   Toileting- Water quality scientist and Hygiene: Supervision/safety;Sit to/from stand       Functional mobility during ADLs: Supervision/safety;Min guard General ADL Comments: Overall good safety awareness, needs more education on energy conservation            Cognition   Behavior During Therapy: WFL for tasks assessed/performed Overall Cognitive Status: Within Functional Limits for tasks assessed                 Pertinent Vitals/ Pain       Pain Assessment: No/denies pain   Frequency Min 3X/week     Progress Toward Goals  OT Goals(current goals can now befound in the care plan section)  Progress towards OT goals: Progressing toward goals  Acute Rehab OT Goals Patient Stated Goal: Go to son's house  OT Goal Formulation: With patient Time For Goal Achievement: 01/16/16 Potential to Achieve Goals: Good  Plan Discharge plan remains appropriate    End of Session Equipment Utilized During Treatment: Rolling walker;Oxygen   Activity Tolerance Patient tolerated treatment well   Patient Left in chair;with call bell/phone within reach;with chair alarm set    Time: [id number] OT Time Calculation (min): 20 min  Charges: OT General Charges $OT Visit: 1 Procedure OT Treatments $Self Care/Home Management : 8-22 mins  Chrys Racer ,  MS, OTR/L, CLT Pager: [phone]  01/04/2016, 11:06 AM

## 2016-01-04 NOTE — Progress Notes (Signed)
Physical Therapy Treatment Patient Details Name: Shannon [name] MRN: [id number] DOB: [birth year] Today's Date: 01/04/2016    History of Present Illness Shannon [name] is a 82 y.o. female with CAD status post stenting, CHF, paroxysmal atrial fibrillation, diabetes mellitus type 2, hyperlipidemia who was admitted due to acute hypoxic respiratory faiilure with new cardiomyopathy with acute on chronic CHF.    PT Comments    Pt was seen for evaluation of current mobility tolerance and note sats and pulses are impacted by minor exercise.  Needed rest breaks due to fluctuations and did not see her pulses decline, highest levels of 118 with minor sitting exercises.  Follow Up Recommendations  Home health PT;Supervision - Intermittent     Equipment Recommendations  Other (comment) (toilet riser)    Recommendations for Other Services       Precautions / Restrictions Precautions Precautions: Fall (telemetry) Precaution Comments: O2 use and monitor vitals with therapy Restrictions Weight Bearing Restrictions: No    Mobility  Bed Mobility               General bed mobility comments: up when PT entered  Transfers Overall transfer level: Needs assistance Equipment used: 1 person hand held assist Transfers: Sit to/from [facility] Sit to Stand: Min guard Stand pivot transfers: Min guard       General transfer comment: Min guard assist for balance and safety. Cues for safe hand placement and O2 management.  Ambulation/Gait             General Gait Details: Pt was fatigued and sats low, pulses high when she finished work of moving from [place] Mobility    Modified Rankin (Stroke Patients Only)       Balance Overall balance assessment: Needs assistance Sitting-balance support: Feet supported Sitting balance-Leahy Scale: Good     Standing balance support: Single extremity supported Standing balance-Leahy  Scale: Fair Standing balance comment: sats drop with effort and pulses are high                    Cognition Arousal/Alertness: Awake/alert Behavior During Therapy: WFL for tasks assessed/performed Overall Cognitive Status: Within Functional Limits for tasks assessed                      Exercises General Exercises - Lower Extremity Ankle Circles/Pumps: AAROM;AROM;Both;10 reps Quad Sets: AROM;Both;10 reps Gluteal Sets: AROM;Both;10 reps Long Arc Quad: AROM;Both;10 reps Heel Slides: AROM;Both;10 reps Hip ABduction/ADduction: AROM;Both;10 reps    General Comments General comments (skin integrity, edema, etc.): Tachy with effort, not below 115 with PT and O2 sats were down to 90% with just ther exercise      Pertinent Vitals/Pain Pain Assessment: No/denies pain    Home Living                      Prior Function            PT Goals (current goals can now be found in the care plan section) Acute Rehab PT Goals Patient Stated Goal: To return home as independent as possible Progress towards PT goals: Progressing toward goals    Frequency  Min 3X/week    PT Plan Current plan remains appropriate    Co-evaluation             End of Session Equipment Utilized During Treatment: Oxygen Activity Tolerance: Patient tolerated  treatment well;Treatment limited secondary to medical complications (Comment) (observation of pulses and sats with rests needed) Patient left: in chair;with call bell/phone within reach;with chair alarm set;with nursing/sitter in room     Time: 0950-1017 PT Time Calculation (min) (ACUTE ONLY): 27 min  Charges:  $Therapeutic Exercise: 8-22 mins $Therapeutic Activity: 8-22 mins                    G Codes:      Ramond Dial 01/13/2016, 10:45 AM    Mee Hives, PT MS Acute Rehab Dept. Number: [place] and [place]

## 2016-01-04 NOTE — Progress Notes (Signed)
Hospital Problem List     Principal Problem:   Acute respiratory failure with hypoxia (HCC) Active Problems:   Chronic atrial fibrillation (HCC)   Abdominal pain   CAD (coronary artery disease)   Cardiomyopathy (HCC)   Chronic systolic CHF (congestive heart failure) (HCC)   Elevated troponin   Acute on chronic systolic CHF (congestive heart failure) (HCC)   Leg swelling   SOB (shortness of breath)    Patient Profile:   Primary Cardiologist: Dr. Acie Fredrickson  82 yo female w/ PMH of CAD (s/p DES to [place] in [id number]), chronic systolic CHF, DM, TIA, and chronic atrial fibrillation (on coumadin) who presented to [facility] ED on 12/31/2015 for worsening dyspnea and chest discomfort. EF reduced to 20-25% (previously 50-55% in 04/2015).  Subjective   Denies any chest pain or palpitations. Reports her breathing is improving.   Inpatient Medications    . amiodarone  200 mg Oral Daily  . aspirin EC  81 mg Oral Daily  . atorvastatin  20 mg Oral Daily  . cefTRIAXone (ROCEPHIN)  IV  2 g Intravenous Q24H  . ferrous sulfate  325 mg Oral Q breakfast  . furosemide  40 mg Intravenous BID  . insulin aspart  0-9 Units Subcutaneous TID WC  . LORazepam  0.5 mg Intravenous Once  . metoprolol succinate  37.5 mg Oral BID  . potassium chloride  10 mEq Oral Daily  . saccharomyces boulardii  250 mg Oral BID  . sodium chloride flush  3 mL Intravenous Q12H    Vital Signs    Filed Vitals:   01/03/16 1048 01/03/16 1500 01/03/16 2054 01/04/16 0333  BP: 96/70 94/62 101/74 109/75  Pulse: 133 124 118 111  Temp:  97.6 F (36.4 C) 98.1 F (36.7 C) 98.1 F (36.7 C)  TempSrc:  Oral Oral Oral  Resp:  20 18 21   Height:      Weight:    149 lb 8 oz (67.813 kg)  SpO2:  93% 95% 93%    Intake/Output Summary (Last 24 hours) at 01/04/16 0819 Last data filed at 01/04/16 0335  Gross per 24 hour  Intake    340 ml  Output   2325 ml  Net  -1985 ml   Filed Weights   01/02/16 0410 01/03/16 0419 01/04/16 0333    Weight: 156 lb (70.761 kg) 152 lb 8 oz (69.174 kg) 149 lb 8 oz (67.813 kg)    Physical Exam    General: Elderly Caucasian female appearing in no acute distress. Head: Normocephalic, atraumatic.  Neck: Supple without bruits, JVD elevated to 9cm. Lungs:  Resp regular and unlabored, mild rales at bases bilaterally. Heart: Irregularly irregular, S1, S2, no S3, S4, 2/6 SEM LLSB; no rub. Abdomen: Soft, non-tender, non-distended with normoactive bowel sounds. No hepatomegaly. No rebound/guarding. No obvious abdominal masses. Extremities: No clubbing or cyanosis, 2+ pre-tibial edema bilaterally. Distal pedal pulses are 2+ bilaterally. Neuro: Alert and oriented X 3. Moves all extremities spontaneously. Psych: Normal affect.  Labs    CBC  Recent Labs  01/03/16 0302 01/04/16 0325  WBC 9.2 10.6*  HGB 12.1 12.5  HCT 36.9 40.0  MCV 103.1* 104.7*  PLT 257 [id number]   Basic Metabolic Panel  Recent Labs  01/02/16 0306 01/03/16 0302 01/04/16 0325  NA 136 137 139  K 3.3* 3.4* 4.2  CL 102 102 101  CO2 27 29 31   GLUCOSE 123* 141* 169*  BUN 18 16 17   CREATININE 0.93 0.81 0.86  CALCIUM  8.1* 7.9* 8.1*  MG 1.3*  --  1.7   Liver Function Tests No results for input(s): AST, ALT, ALKPHOS, BILITOT, PROT, ALBUMIN in the last 72 hours. No results for input(s): LIPASE, AMYLASE in the last 72 hours. Cardiac Enzymes No results for input(s): CKTOTAL, CKMB, CKMBINDEX, TROPONINI in the last 72 hours. BNP Invalid input(s): POCBNP D-Dimer  Recent Labs  01/01/16 1124  DDIMER 2.18*     Telemetry    Atrial fibrillation, HR in low-100's - 110's.   ECG    No new tracings.   Cardiac Studies and Radiology    Ct Abdomen Pelvis Wo Contrast  12/31/2015  CLINICAL DATA:  Abdominal pain and nausea for 3 days EXAM: CT ABDOMEN AND PELVIS WITHOUT CONTRAST TECHNIQUE: Multidetector CT imaging of the abdomen and pelvis was performed following the standard protocol without IV contrast. COMPARISON:  None.  FINDINGS: There is small to moderate volume ascites. There are unremarkable unenhanced appearances of the liver, spleen, pancreas and adrenals. There are several calculi in the gallbladder lumen measuring up to 4 mm. Kidneys are unremarkable. Urinary bladder is unremarkable. Uterus and adnexal structures are unremarkable. Bowel is unremarkable. Appendix is normal. Enteric contrast has reached the rectum. There is a large right effusion and a moderate left effusion. There is mild patchy and linear opacity in both lung bases which may be atelectatic but infectious infiltrates cannot be excluded. No significant skeletal lesion is evident. The abdominal aorta is normal in caliber with moderate atherosclerotic calcification. Extensive coronary artery calcified plaque. IMPRESSION: 1. Small to moderate volume ascites. 2. Cholelithiasis. 3. Negative for bowel obstruction, perforation or focal inflammatory change. 4. Bilateral pleura effusions, right greater than left. Lung base opacities may be atelectatic, but infectious infiltrates cannot be excluded. Electronically Signed   By: Andreas Newport M.D.   On: 12/31/2015 06:28   Dg Chest 2 View  12/31/2015  CLINICAL DATA:  Dyspnea, cough and weakness for 3 months. EXAM: CHEST  2 VIEW COMPARISON:  11/16/2015 FINDINGS: There is unchanged moderate cardiomegaly and aortic tortuosity. There is focal airspace consolidation in the left base, as well as curvilinear atelectatic appearing opacities in the lateral lungs. Probable small effusions bilaterally. IMPRESSION: Airspace opacities and effusions bilaterally. This may represent pneumonia. Electronically Signed   By: Andreas Newport M.D.   On: 12/31/2015 00:51    Echocardiogram: 01/01/2016 Study Conclusions  - Left ventricle: The cavity size was normal. Wall thickness was  normal. Systolic function was severely reduced. The estimated  ejection fraction was in the range of 20% to 25%. Diffuse  hypokinesis. - Aortic  valve: There was mild regurgitation. - Mitral valve: Calcified annulus. Mildly thickened leaflets .  There was mild regurgitation. - Left atrium: The atrium was moderately dilated. - Right ventricle: The cavity size was mildly dilated. Systolic  function was mildly reduced. - Right atrium: The atrium was severely dilated. - Tricuspid valve: There was severe regurgitation. - Pulmonary arteries: Systolic pressure was severely increased. PA  peak pressure: 64 mm Hg (S). - Pericardium, extracardiac: A small pericardial effusion was  identified. There was a left pleural effusion.  Impressions: - Severe global reduction in LV function; biatrial enlargement;  mild RVE with mildly reduced function; small pericardial  effusion; mild AI and MR; severe TR with severely elevated  pulmonary pressure.  Assessment & Plan    1. Acute on chronic systolic CHF  - presented with chest discomfort and worsening dyspnea. Echo this admission shows a newly decreased EF of 20-25% with  diffuse HK as compared to her prior EF of 50-55% in 04/2015.  Delene Loll held secondary to low BP. Can hopefully resume once off IV diuresis. Toprol-XL increased to 37.5 mg BID on 01/03/2016.  - Net -6.4L since admission. Weight down 16 lbs. Dry weight less than 150 lbs, currently at 149 lbs today. Would continue IV diuresis at least an additional day, likely switch to PO in the next 24-48 hours. - Will need an ischemic evaluation once INR has decreased to less than 1.7.At 2.38 today.   2. CAD  - s/p stenting of OM in 2007. Plan cardiac cath later this week depending on INR. Mild troponin elevation (peak 0.10) suggests more demand ischemia from CHF. - continue ASA, statin, and BB.   3. Diabetes  - with ulceration of toe. Per IM.  - Metformin held.  4. Hyperlipidemia - continue statin therapy.  5. Persistent Atrial Fibrillation - since July 2016. On amiodarone and metoprolol for rate control (Toprol-XL increased to  37.5mg  BID on 01/03/2016). Once off IV diuresis, BP will hopefully allow for further titration of her medications. - This patients CHA2DS2-VASc Score and unadjusted Ischemic Stroke Rate (% per year) is equal to 12.2 % stroke rate/year from a score of 9 (CHF, HTN, DM, Vascular, Female, Age (2), TIA(2)). Coumadin on hold for planned invasive procedure. IV heparin per pharmacy.   6. Hypokalemia - K+ 3.3 this AM. - will replace.  Arna Medici , PA-C 8:19 AM 01/04/2016 Pager: [phone] Patient seen and examined and history reviewed. Agree with above findings and plan. She is feeling much better. Less dyspnea. Excellent diuresis. Weight is down. INR is still too high for cath. INR 2.38. Will repeat in am. Plan right and left heart cath when INR less than 1.7. Will continue IV lasix for one more day. Replete potassium.  Ariday Brinker [place], [place] 01/04/2016 10:00 AM

## 2016-01-04 NOTE — Progress Notes (Signed)
Progress Note    KARA DRILLING  [id number] DOB: [birth year]  DOA: 12/30/2015 PCP: Haywood Pao, MD    Brief Narrative:   Shannon [name] is an 82 y.o. female with CAD status post stenting, CHF, paroxysmal atrial fibrillation, diabetes mellitus type 2, hyperlipidemia was brought to the ER after patient was complaining of abdominal pain and shortness of breath.. On exam patient also has bilateral lower extremity edema and wound of the left great toe. Chest x-ray shows features concerning for pneumonia and BNP is elevated. Patient at this time will be admitted for acute respiratory failure probably from CHF and pneumonia.  Assessment/Plan:   Principal Problem:   Acute respiratory failure with hypoxia (HCC) Active Problems:   Chronic atrial fibrillation (HCC)   Abdominal pain   CAD (coronary artery disease)   Cardiomyopathy (HCC)   Chronic systolic CHF (congestive heart failure) (HCC)   Elevated troponin   Acute on chronic systolic CHF (congestive heart failure) (HCC)   Leg swelling   SOB (shortness of breath)   Acute respiratory failure with hypoxia/Acute systolic CHF -Improving  -Likely multi-factorial including heart failure and possible pneumonia -Upon admission to the ED, SPO2 87%, continue oxygen supplementation -BNP 593 -Chest x-ray: Airspace opacities with effusions bilaterally. -Echocardiogram 04/07/2015 showed an EF of 50-55% - repeat Echocardiogram 01/01/2016: EF 20-25%, diffuse hypokinesis, Severe TR with severely elevated pulmonary pressure -Monitor intake and output, daily weights.  -Weight down 17lbs -Continue empiric antibiotics. Initially placed on vancomycin and aztreonam- will deescalate to ceftriaxone (patient does have a PCN allergy- but it is unknown). Complete the course of the antibiotics. Plan for repeat CXR in am.  -Although ddimer elevated, unlikely PE, as patient's INR has been supratherapeutic.  -LE doppler negative for DVT or  SVT -Cardiology consulted and appreciated -Patient will need heart cath once INR is <1.7  Abdominal pain -Improved  -CT abdomen and pelvis: small to moderate volume ascites, cholelithiasis, negative for bowel obstruction, perforation or ambulatory change. Bilateral pleural effusions, right greater than left. -Continue to monitor, pain control  Coronary artery disease -Denies chest pain -Troponin mildly elevated but trending downward (0.1, 0.07, 0.06, 0.05) -Continue aspirin, statin, metoprolol.  - plan for cardiac cath when INR is less than 1.7.   Atrial fibrillation, chronic -CHADSVASC 4 (age, gender, DM) -Coumadin held, INR therapeutic. Rate controlled. Resume metoprolol.  -Heparin to start when INR <2  Diabetes mellitus, type II -Metformin held, continued findings for CBG monitoring CBG (last 3)   Recent Labs  01/04/16 0616 01/04/16 1123 01/04/16 1650  GLUCAP 128* 126* 144*    Resume SSI.   Hyperlipidemia -Continue statin  Anemia of chronic disease.  -Continue iron supplementation -Baseline hemoglobin approximately 10, currently around 12.   Hypokalemia/hypomagnesemia -Secondary to diuretics - repleted and repeat in am shows normal values.    Family Communication/Anticipated D/C date and plan/Code Status   DVT prophylaxis: Lovenox ordered. Code Status: Full Code.  Family Communication: family at bedside.  Disposition Plan: pending further investigation followed by PT evaluation.    Medical Consultants:    None.   Procedures:    Anti-Infectives:   Anti-infectives    Start     Dose/Rate Route Frequency Ordered Stop   01/02/16 1300  cefTRIAXone (ROCEPHIN) 2 g in dextrose 5 % 50 mL IVPB     2 g 100 mL/hr over 30 Minutes Intravenous Every 24 hours 01/02/16 1156     01/01/16 0500  vancomycin (VANCOCIN) IVPB 1000 mg/200 mL premix  Status:  Discontinued     1,000 mg 200 mL/hr over 60 Minutes Intravenous Every 24 hours 12/31/15 0440 01/02/16 1156    12/31/15 1400  aztreonam (AZACTAM) 1 g in dextrose 5 % 50 mL IVPB  Status:  Discontinued     1 g 100 mL/hr over 30 Minutes Intravenous Every 8 hours 12/31/15 0440 01/02/16 1156   12/31/15 0600  aztreonam (AZACTAM) 2 g in dextrose 5 % 50 mL IVPB  Status:  Discontinued     2 g 100 mL/hr over 30 Minutes Intravenous Every 8 hours 12/31/15 0428 12/31/15 0435   12/31/15 0130  aztreonam (AZACTAM) 1 g in dextrose 5 % 50 mL IVPB     1 g 100 mL/hr over 30 Minutes Intravenous  Once 12/31/15 0121 12/31/15 0338   12/31/15 0130  vancomycin (VANCOCIN) IVPB 1000 mg/200 mL premix     1,000 mg 200 mL/hr over 60 Minutes Intravenous NOW 12/31/15 0122 12/31/15 0247      Subjective:    [name] denies any new pain, or sensory deficits. She is cheerful.   Objective:    Filed Vitals:   01/03/16 1500 01/03/16 2054 01/04/16 0333 01/04/16 1027  BP: 94/62 101/74 109/75 90/68  Pulse: 124 118 111 112  Temp: 97.6 F (36.4 C) 98.1 F (36.7 C) 98.1 F (36.7 C)   TempSrc: Oral Oral Oral   Resp: 20 18 21    Height:      Weight:   67.813 kg (149 lb 8 oz)   SpO2: 93% 95% 93% 98%    Intake/Output Summary (Last 24 hours) at 01/04/16 1058 Last data filed at 01/04/16 0900  Gross per 24 hour  Intake    580 ml  Output   2825 ml  Net  -2245 ml   Filed Weights   01/02/16 0410 01/03/16 0419 01/04/16 0333  Weight: 70.761 kg (156 lb) 69.174 kg (152 lb 8 oz) 67.813 kg (149 lb 8 oz)    Exam: General exam: Appears calm and comfortable. Very cheerful.  Respiratory system: Clear to auscultation. Respiratory effort normal. Cardiovascular system: S1 & S2 heard, RRR. No JVD,  rubs, gallops or clicks. No murmurs. Gastrointestinal system: Abdomen is nondistended, soft and nontender. No organomegaly or masses felt. Normal bowel sounds heard. Central nervous system: Alert and oriented. No  New focal neurological deficits. Extremities: No clubbing, edema, or cyanosis. Skin: No rashes, lesions or  ulcers Psychiatry: Judgement and insight appear normal. Mood & affect appropriate.   Data Reviewed:   I have personally reviewed following labs and imaging studies:  Labs: Basic Metabolic Panel:  Recent Labs Lab 12/31/15 0525 01/01/16 0248 01/02/16 0306 01/03/16 0302 01/04/16 0325  NA 136 137 136 137 139  K 3.5 3.4* 3.3* 3.4* 4.2  CL 101 103 102 102 101  CO2 23 26 27 29 31   GLUCOSE 138* 129* 123* 141* 169*  BUN 20 19 18 16 17   CREATININE 0.99 0.99 0.93 0.81 0.86  CALCIUM 8.6* 8.4* 8.1* 7.9* 8.1*  MG  --   --  1.3*  --  1.7   GFR Estimated Creatinine Clearance: 45.5 mL/min (by C-G formula based on Cr of 0.86). Liver Function Tests:  Recent Labs Lab 12/30/15 2327 12/31/15 0525  AST 39 34  ALT 28 27  ALKPHOS 202* 197*  BILITOT 1.3* 1.2  PROT 5.9* 6.0*  ALBUMIN 2.7* 2.5*    Recent Labs Lab 12/30/15 2327 12/31/15 0525  LIPASE 41 38   No results for input(s): AMMONIA  in the last 168 hours. Coagulation profile  Recent Labs Lab 12/30/15 2327 01/01/16 0248 01/02/16 0306 01/03/16 0302 01/04/16 0325  INR 3.35* 3.02* 2.83* 2.85* 2.38*    CBC:  Recent Labs Lab 12/30/15 2327 12/31/15 0525 01/02/16 0306 01/03/16 0302 01/04/16 0325  WBC 7.6 8.1 6.7 9.2 10.6*  NEUTROABS  --  6.3  --   --   --   HGB 11.5* 11.7* 10.9* 12.1 12.5  HCT 37.4 37.0 35.1* 36.9 40.0  MCV 104.8* 103.9* 103.8* 103.1* 104.7*  PLT 273 263 216 257 227   Cardiac Enzymes:  Recent Labs Lab 12/30/15 2346 12/31/15 0525 12/31/15 1022 12/31/15 1614  TROPONINI 0.10* 0.07* 0.06* 0.05*   BNP (last 3 results) No results for input(s): PROBNP in the last 8760 hours. CBG:  Recent Labs Lab 01/03/16 0602 01/03/16 1113 01/03/16 1706 01/03/16 2050 01/04/16 0616  GLUCAP 192* 143* 165* 204* 128*   D-Dimer:  Recent Labs  01/01/16 1124  DDIMER 2.18*   Hgb A1c: No results for input(s): HGBA1C in the last 72 hours. Lipid Profile: No results for input(s): CHOL, HDL, LDLCALC,  TRIG, CHOLHDL, LDLDIRECT in the last 72 hours. Thyroid function studies: No results for input(s): TSH, T4TOTAL, T3FREE, THYROIDAB in the last 72 hours.  Invalid input(s): FREET3 Anemia work up: No results for input(s): VITAMINB12, FOLATE, FERRITIN, TIBC, IRON, RETICCTPCT in the last 72 hours. Sepsis Labs:  Recent Labs Lab 12/31/15 0525 01/02/16 0306 01/03/16 0302 01/04/16 0325  WBC 8.1 6.7 9.2 10.6*   Urine analysis:    Component Value Date/Time   COLORURINE YELLOW 12/30/2015 2320   APPEARANCEUR CLOUDY* 12/30/2015 2320   LABSPEC 1.020 12/30/2015 2320   PHURINE 5.0 12/30/2015 2320   GLUCOSEU NEGATIVE 12/30/2015 2320   HGBUR NEGATIVE 12/30/2015 2320   BILIRUBINUR NEGATIVE 12/30/2015 2320   KETONESUR NEGATIVE 12/30/2015 2320   PROTEINUR 30* 12/30/2015 2320   UROBILINOGEN 0.2 10/09/2012 1551   NITRITE NEGATIVE 12/30/2015 2320   LEUKOCYTESUR SMALL* 12/30/2015 2320   Microbiology No results found for this or any previous visit (from the past 240 hour(s)).  Radiology: No results found.  Medications:   . amiodarone  200 mg Oral Daily  . aspirin EC  81 mg Oral Daily  . atorvastatin  20 mg Oral Daily  . cefTRIAXone (ROCEPHIN)  IV  2 g Intravenous Q24H  . ferrous sulfate  325 mg Oral Q breakfast  . furosemide  40 mg Intravenous BID  . insulin aspart  0-9 Units Subcutaneous TID WC  . LORazepam  0.5 mg Intravenous Once  . metoprolol succinate  37.5 mg Oral BID  . potassium chloride  10 mEq Oral Daily  . saccharomyces boulardii  250 mg Oral BID  . sodium chloride flush  3 mL Intravenous Q12H   Continuous Infusions:   Time spent: 25 minutes.    LOS: 4 days   [facility]  [facility] Pager [phone]  *Please refer to [email or web address], password [email or web address] to get updated schedule on who will round on this patient, as hospitalists switch teams weekly. If 7PM-7AM, please contact night-coverage at [email or web address], password [email or web address] for any overnight needs.  01/04/2016, 10:58 AM

## 2016-01-04 NOTE — Progress Notes (Signed)
ANTICOAGULATION CONSULT NOTE - INITIAL  Pharmacy Consult for  Heparin when INR <2 Indication: afib  Allergies  Allergen Reactions  . Demerol Other (See Comments)    Burning sensation all over body  . Erythromycin Nausea And Vomiting  . Penicillins Other (See Comments)    unknown  . Percocet [Oxycodone-Acetaminophen] Swelling    Patient Measurements: Height: 5\' 2"  (157.5 cm) Weight: 149 lb 8 oz (67.813 kg) IBW/kg (Calculated) : 50.1  Heparin dosing wt: 66 kg  Vital Signs: Temp: 98.1 F (36.7 C) (07/05 0333) Temp Source: Oral (07/05 0333) BP: 109/75 mmHg (07/05 0333) Pulse Rate: 111 (07/05 0333) Intake/Output from previous day: 07/04 0701 - 07/05 0700 In: 343 [P.O.:340; I.V.:3] Out: 2325 [Urine:2325] Intake/Output from this shift:    Labs:  Recent Labs  01/02/16 0306 01/03/16 0302 01/04/16 0325  WBC 6.7 9.2 10.6*  HGB 10.9* 12.1 12.5  PLT 216 257 227  CREATININE 0.93 0.81 0.86   Estimated Creatinine Clearance: 45.5 mL/min (by C-G formula based on Cr of 0.86). No results for input(s): VANCOTROUGH, VANCOPEAK, VANCORANDOM, GENTTROUGH, GENTPEAK, GENTRANDOM, TOBRATROUGH, TOBRAPEAK, TOBRARND, AMIKACINPEAK, AMIKACINTROU, AMIKACIN in the last 72 hours.   Microbiology: No results found for this or any previous visit (from the past 720 hour(s)).  Assessment: 82 y.o. F on Coumadin PTA for afib. Admission INR 3.35. Plan to hold coumadin and start heparin bridge when INR < 2. Plans noted for cath when INR < 1.7.  -INR 2.38 this AM, CBC wnl, no bleed documented.  Home dose: Coumadin 1.25mg  po daily - last dose 6/29  Goal of Therapy:  Heparin level 0.3-0.7 units/ml Will monitor platelets according to protocol  Plan:  Heparin when INR<2 Daily INR  Elicia Lamp, PharmD, [facility] Clinical Pharmacist Pager [phone] 01/04/2016 8:59 AM

## 2016-01-05 ENCOUNTER — Other Ambulatory Visit: Payer: Self-pay

## 2016-01-05 ENCOUNTER — Inpatient Hospital Stay (HOSPITAL_COMMUNITY): Payer: Commercial Managed Care - HMO

## 2016-01-05 LAB — BASIC METABOLIC PANEL
Anion gap: 8 (ref 5–15)
BUN: 17 mg/dL (ref 6–20)
CALCIUM: 8.5 mg/dL — AB (ref 8.9–10.3)
CO2: 30 mmol/L (ref 22–32)
CREATININE: 0.85 mg/dL (ref 0.44–1.00)
Chloride: 100 mmol/L — ABNORMAL LOW (ref 101–111)
GFR calc Af Amer: >60 mL/min (ref >60)
GLUCOSE: 167 mg/dL — AB (ref 65–99)
Potassium: 4.1 mmol/L (ref 3.5–5.1)
SODIUM: 138 mmol/L (ref 135–145)

## 2016-01-05 LAB — GLUCOSE, CAPILLARY
GLUCOSE-CAPILLARY: 143 mg/dL — AB (ref 65–99)
Glucose-Capillary: 122 mg/dL — ABNORMAL HIGH (ref 65–99)
Glucose-Capillary: 138 mg/dL — ABNORMAL HIGH (ref 65–99)
Glucose-Capillary: 149 mg/dL — ABNORMAL HIGH (ref 65–99)

## 2016-01-05 LAB — PROTIME-INR
INR: 1.99 — ABNORMAL HIGH (ref 0.00–1.49)
PROTHROMBIN TIME: 22.5 s — AB (ref 11.6–15.2)

## 2016-01-05 LAB — HEPARIN LEVEL (UNFRACTIONATED): HEPARIN UNFRACTIONATED: 0.34 [IU]/mL (ref 0.30–0.70)

## 2016-01-05 MED ORDER — HEPARIN (PORCINE) IN NACL 100-0.45 UNIT/ML-% IJ SOLN
1000.0000 [IU]/h | INTRAMUSCULAR | Status: DC
  Administered 2016-01-05: 1000 [IU]/h via INTRAVENOUS
  Filled 2016-01-05: qty 250

## 2016-01-05 MED ORDER — SODIUM CHLORIDE 0.9% FLUSH: 3.0000 mL | INTRAVENOUS | Status: DC | PRN

## 2016-01-05 MED ORDER — ASPIRIN 81 MG PO CHEW
81.0000 mg | CHEWABLE_TABLET | ORAL | Status: AC
  Administered 2016-01-06: 81 mg via ORAL
  Filled 2016-01-05: qty 1

## 2016-01-05 MED ORDER — FUROSEMIDE 40 MG PO TABS
40.0000 mg | ORAL_TABLET | Freq: Every day | ORAL | Status: DC
  Administered 2016-01-06 – 2016-01-07 (×2): 40 mg via ORAL
  Filled 2016-01-05 (×2): qty 1

## 2016-01-05 MED ORDER — METOPROLOL SUCCINATE ER 50 MG PO TB24
50.0000 mg | ORAL_TABLET | Freq: Two times a day (BID) | ORAL | Status: DC
  Administered 2016-01-05 – 2016-01-11 (×13): 50 mg via ORAL
  Filled 2016-01-05 (×14): qty 1

## 2016-01-05 MED ORDER — SODIUM CHLORIDE 0.9% FLUSH
3.0000 mL | Freq: Two times a day (BID) | INTRAVENOUS | Status: DC
  Administered 2016-01-05: 3 mL via INTRAVENOUS

## 2016-01-05 MED ORDER — SODIUM CHLORIDE 0.9 % IV SOLN
INTRAVENOUS | Status: DC
  Administered 2016-01-06: 06:00:00 via INTRAVENOUS

## 2016-01-05 MED ORDER — SODIUM CHLORIDE 0.9 % IV SOLN: 250.0000 mL | INTRAVENOUS | Status: DC | PRN

## 2016-01-05 NOTE — Consult Note (Signed)
   [facility] CM Inpatient Consult   01/05/2016  AMEKA KRIGBAUM 05-06-1932 [id number]  Patient evaluated for community based chronic disease management services with [facility] as a benefit of patient's [facility] and had been previously out reached by [facility] without good maintaining of contact.  Patient is an 82 y.o. female with CAD status post stenting, CHF, paroxysmal atrial fibrillation, diabetes mellitus type 2, hyperlipidemia and admitted with c/o abdominal pain and shortness of breath.. A chest x-ray revealed features concerning for pneumonia. Patient at the time was admitted for acute respiratory failure probably from CHF and pneumonia. Met with the patient at bedside to explain [facility] [facility]. 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  States, "I was just worn out and didn't know it."  Consent form signed.  Patient will receive post hospital discharge call and will be evaluated for monthly home visits for assessments and disease process education.  Patient states she is planning to discharge to her son's home in [facility] when she is ready to get back to the community.  Left contact information and [facility] literature at bedside. Of note, [facility] services does not replace or interfere with any services that are arranged by inpatient case management or social work.  For additional questions or referrals please contact:    Natividad Brood, RN BSN [facility] Liaison  [phone] business mobile phone Toll free office [phone]

## 2016-01-05 NOTE — Progress Notes (Signed)
Progress Note    Shannon [name]  [id number] DOB: [birth year]  DOA: 12/30/2015 PCP: Haywood Pao, MD    Brief Narrative:   Shannon [name] is an 82 y.o. female with CAD status post stenting, CHF, paroxysmal atrial fibrillation, diabetes mellitus type 2, hyperlipidemia was brought to the ER after patient was complaining of abdominal pain and shortness of breath.. On exam patient also has bilateral lower extremity edema and wound of the left great toe. Chest x-ray shows features concerning for pneumonia and BNP is elevated. Patient at this time will be admitted for acute respiratory failure probably from CHF and pneumonia.  Assessment/Plan:   Principal Problem:   Acute respiratory failure with hypoxia (HCC) Active Problems:   Chronic atrial fibrillation (HCC)   Abdominal pain   CAD (coronary artery disease)   Cardiomyopathy (HCC)   Chronic systolic CHF (congestive heart failure) (HCC)   Elevated troponin   Acute on chronic systolic CHF (congestive heart failure) (HCC)   Leg swelling   SOB (shortness of breath)   Acute respiratory failure with hypoxia/Acute systolic CHF -Improving  -Likely multi-factorial including heart failure and possible pneumonia -Upon admission to the ED, SPO2 87%, continue oxygen supplementation,plan to wean her off the oxygen.  - started on IV lasix and changed to po lasix today. Weight down by 18 lbs since admission. -Chest x-ray: Airspace opacities with effusions bilaterally. Repeat CXR today.  -Echocardiogram 04/07/2015 showed an EF of 50-55% - repeat Echocardiogram 01/01/2016: EF 20-25%, diffuse hypokinesis, Severe TR with severely elevated pulmonary pressure -Monitor intake and output, daily weights.  -Continue empiric antibiotics. Initially placed on vancomycin and aztreonam- will deescalate to ceftriaxone (patient does have a PCN allergy- but it is unknown). Complete the course of the antibiotics. Last day of antibiotics 7/7.    -Although ddimer elevated, unlikely PE, as patient's INR has been supratherapeutic.  -LE doppler negative for DVT or SVT -Cardiology consulted and appreciated -Patient will need heart cath once INR is <1.7  Abdominal pain -Improved  -CT abdomen and pelvis: small to moderate volume ascites, cholelithiasis, negative for bowel obstruction, perforation or ambulatory change. Bilateral pleural effusions, right greater than left. -Continue to monitor, pain control  Coronary artery disease -Denies chest pain -Troponin mildly elevated but trending downward (0.1, 0.07, 0.06, 0.05) -Continue aspirin, statin, metoprolol.  - plan for cardiac cath when INR is less than 1.7.   Atrial fibrillation, chronic -CHADSVASC 4 (age, gender, DM) -Coumadin held, INR therapeutic. Rate slightly elevated, metoprolol increased by cardiology. Heparin started as INR is 1.9 -Heparin to start when INR <2  Left great toe diabetic wound: Wound care consulted and resume local wound care.  No surrounding cellulitis.   Diabetes mellitus, type II -Metformin held, continued findings for CBG monitoring CBG (last 3)   Recent Labs  01/04/16 2120 01/05/16 0607 01/05/16 1111  GLUCAP 140* 122* 138*    Resume SSI.   Hyperlipidemia -Continue statin  Anemia of chronic disease.  -Continue iron supplementation -Baseline hemoglobin approximately 10, currently around 12.   Hypokalemia/hypomagnesemia -Secondary to diuretics - repleted and repeat in am shows normal values.    Family Communication/Anticipated D/C date and plan/Code Status   DVT prophylaxis: IV heparin Code Status: Full Code.  Family Communication: none at bedside.  Disposition Plan: pending further investigation followed by PT evaluation. Plan for cardiac cath in am.   Medical Consultants:    cardiology   Procedures:   Cardiac cath scheduled for 7/7 Anti-Infectives:   Anti-infectives  Start     Dose/Rate Route Frequency Ordered  Stop   01/02/16 1300  cefTRIAXone (ROCEPHIN) 2 g in dextrose 5 % 50 mL IVPB     2 g 100 mL/hr over 30 Minutes Intravenous Every 24 hours 01/02/16 1156     01/01/16 0500  vancomycin (VANCOCIN) IVPB 1000 mg/200 mL premix  Status:  Discontinued     1,000 mg 200 mL/hr over 60 Minutes Intravenous Every 24 hours 12/31/15 0440 01/02/16 1156   12/31/15 1400  aztreonam (AZACTAM) 1 g in dextrose 5 % 50 mL IVPB  Status:  Discontinued     1 g 100 mL/hr over 30 Minutes Intravenous Every 8 hours 12/31/15 0440 01/02/16 1156   12/31/15 0600  aztreonam (AZACTAM) 2 g in dextrose 5 % 50 mL IVPB  Status:  Discontinued     2 g 100 mL/hr over 30 Minutes Intravenous Every 8 hours 12/31/15 0428 12/31/15 0435   12/31/15 0130  aztreonam (AZACTAM) 1 g in dextrose 5 % 50 mL IVPB     1 g 100 mL/hr over 30 Minutes Intravenous  Once 12/31/15 0121 12/31/15 0338   12/31/15 0130  vancomycin (VANCOCIN) IVPB 1000 mg/200 mL premix     1,000 mg 200 mL/hr over 60 Minutes Intravenous NOW 12/31/15 0122 12/31/15 0247      Subjective:    Shannon [name] denies any new pain,   Objective:    Filed Vitals:   01/05/16 0349 01/05/16 0640 01/05/16 0948 01/05/16 1259  BP: 99/72  90/58 93/58  Pulse: 110  111 105  Temp: 97.9 F (36.6 C) 98.4 F (36.9 C)  98.2 F (36.8 C)  TempSrc: Oral Oral  Oral  Resp: 16   17  Height:      Weight: 66.679 kg (147 lb)     SpO2: 98%   97%    Intake/Output Summary (Last 24 hours) at 01/05/16 1329 Last data filed at 01/05/16 1217  Gross per 24 hour  Intake    600 ml  Output   1600 ml  Net  -1000 ml   Filed Weights   01/04/16 0333 01/05/16 0152 01/05/16 0349  Weight: 67.813 kg (149 lb 8 oz) 66.86 kg (147 lb 6.4 oz) 66.679 kg (147 lb)    Exam: General exam: Appears calm and comfortable. Not in distress on 2 lit of [place] oxygen.  Respiratory system: Clear to auscultation. Respiratory effort normal. Cardiovascular system: S1 & S2 heard, RRR. No JVD,  rubs, gallops or clicks. No  murmurs. Gastrointestinal system: Abdomen is nondistended, soft and nontender. No organomegaly or masses felt. Normal bowel sounds heard. Central nervous system: Alert and oriented. No  New focal neurological deficits. Extremities: bilateral 2 + edema. Left great toe wound.  Psychiatry: Judgement and insight appear normal. Mood & affect appropriate.   Data Reviewed:   I have personally reviewed following labs and imaging studies:  Labs: Basic Metabolic Panel:  Recent Labs Lab 01/01/16 0248 01/02/16 0306 01/03/16 0302 01/04/16 0325 01/05/16 0842  NA 137 136 137 139 138  K 3.4* 3.3* 3.4* 4.2 4.1  CL 103 102 102 101 100*  CO2 26 27 29 31 30   GLUCOSE 129* 123* 141* 169* 167*  BUN 19 18 16 17 17   CREATININE 0.99 0.93 0.81 0.86 0.85  CALCIUM 8.4* 8.1* 7.9* 8.1* 8.5*  MG  --  1.3*  --  1.7  --    GFR Estimated Creatinine Clearance: 45.7 mL/min (by C-G formula based on Cr of 0.85). Liver  Function Tests:  Recent Labs Lab 12/30/15 2327 12/31/15 0525  AST 39 34  ALT 28 27  ALKPHOS 202* 197*  BILITOT 1.3* 1.2  PROT 5.9* 6.0*  ALBUMIN 2.7* 2.5*    Recent Labs Lab 12/30/15 2327 12/31/15 0525  LIPASE 41 38   No results for input(s): AMMONIA in the last 168 hours. Coagulation profile  Recent Labs Lab 01/01/16 0248 01/02/16 0306 01/03/16 0302 01/04/16 0325 01/05/16 0330  INR 3.02* 2.83* 2.85* 2.38* 1.99*    CBC:  Recent Labs Lab 12/30/15 2327 12/31/15 0525 01/02/16 0306 01/03/16 0302 01/04/16 0325  WBC 7.6 8.1 6.7 9.2 10.6*  NEUTROABS  --  6.3  --   --   --   HGB 11.5* 11.7* 10.9* 12.1 12.5  HCT 37.4 37.0 35.1* 36.9 40.0  MCV 104.8* 103.9* 103.8* 103.1* 104.7*  PLT 273 263 216 257 227   Cardiac Enzymes:  Recent Labs Lab 12/30/15 2346 12/31/15 0525 12/31/15 1022 12/31/15 1614  TROPONINI 0.10* 0.07* 0.06* 0.05*   BNP (last 3 results) No results for input(s): PROBNP in the last 8760 hours. CBG:  Recent Labs Lab 01/04/16 1123 01/04/16 1650  01/04/16 2120 01/05/16 0607 01/05/16 1111  GLUCAP 126* 144* 140* 122* 138*   D-Dimer: No results for input(s): DDIMER in the last 72 hours. Hgb A1c: No results for input(s): HGBA1C in the last 72 hours. Lipid Profile: No results for input(s): CHOL, HDL, LDLCALC, TRIG, CHOLHDL, LDLDIRECT in the last 72 hours. Thyroid function studies: No results for input(s): TSH, T4TOTAL, T3FREE, THYROIDAB in the last 72 hours.  Invalid input(s): FREET3 Anemia work up: No results for input(s): VITAMINB12, FOLATE, FERRITIN, TIBC, IRON, RETICCTPCT in the last 72 hours. Sepsis Labs:  Recent Labs Lab 12/31/15 0525 01/02/16 0306 01/03/16 0302 01/04/16 0325  WBC 8.1 6.7 9.2 10.6*   Urine analysis:    Component Value Date/Time   COLORURINE YELLOW 12/30/2015 2320   APPEARANCEUR CLOUDY* 12/30/2015 2320   LABSPEC 1.020 12/30/2015 2320   PHURINE 5.0 12/30/2015 2320   GLUCOSEU NEGATIVE 12/30/2015 2320   HGBUR NEGATIVE 12/30/2015 2320   BILIRUBINUR NEGATIVE 12/30/2015 2320   KETONESUR NEGATIVE 12/30/2015 2320   PROTEINUR 30* 12/30/2015 2320   UROBILINOGEN 0.2 10/09/2012 1551   NITRITE NEGATIVE 12/30/2015 2320   LEUKOCYTESUR SMALL* 12/30/2015 2320   Microbiology No results found for this or any previous visit (from the past 240 hour(s)).  Radiology: No results found.  Medications:   . amiodarone  200 mg Oral Daily  . aspirin EC  81 mg Oral Daily  . atorvastatin  20 mg Oral Daily  . cefTRIAXone (ROCEPHIN)  IV  2 g Intravenous Q24H  . ferrous sulfate  325 mg Oral Q breakfast  . [START ON 01/06/2016] furosemide  40 mg Oral Daily  . insulin aspart  0-9 Units Subcutaneous TID WC  . LORazepam  0.5 mg Intravenous Once  . metoprolol succinate  50 mg Oral BID  . potassium chloride  10 mEq Oral Daily  . saccharomyces boulardii  250 mg Oral BID  . sodium chloride flush  3 mL Intravenous Q12H   Continuous Infusions: . heparin 1,000 Units/hr (01/05/16 0949)    Time spent: 25 minutes.    LOS:  5 days   [facility]  [facility] Pager [phone]  *Please refer to [email or web address], password [email or web address] to get updated schedule on who will round on this patient, as hospitalists switch teams weekly. If 7PM-7AM, please contact night-coverage at [email or web address], password [email or web address] for any overnight needs.  01/05/2016, 1:29 PM

## 2016-01-05 NOTE — Progress Notes (Signed)
[place] for  Heparin Indication: afib  Allergies  Allergen Reactions  . Demerol Other (See Comments)    Burning sensation all over body  . Erythromycin Nausea And Vomiting  . Penicillins Other (See Comments)    unknown  . Percocet [Oxycodone-Acetaminophen] Swelling    Patient Measurements: Height: 5\' 2"  (157.5 cm) Weight: 147 lb (66.679 kg) IBW/kg (Calculated) : 50.1  Heparin dosing wt: 66 kg  Vital Signs: Temp: 98.4 F (36.9 C) (07/06 0640) Temp Source: Oral (07/06 0640) BP: 99/72 mmHg (07/06 0349) Pulse Rate: 110 (07/06 0349) Intake/Output from previous day: 07/05 0701 - 07/06 0700 In: 720 [P.O.:720] Out: 2002 [Urine:2000; Stool:2] Intake/Output from this shift:    Labs:  Recent Labs  01/03/16 0302 01/04/16 0325  WBC 9.2 10.6*  HGB 12.1 12.5  PLT 257 227  CREATININE 0.81 0.86   Estimated Creatinine Clearance: 45.1 mL/min (by C-G formula based on Cr of 0.86). No results for input(s): VANCOTROUGH, VANCOPEAK, VANCORANDOM, GENTTROUGH, GENTPEAK, GENTRANDOM, TOBRATROUGH, TOBRAPEAK, TOBRARND, AMIKACINPEAK, AMIKACINTROU, AMIKACIN in the last 72 hours.   Microbiology: No results found for this or any previous visit (from the past 720 hour(s)).  Assessment: 82 y.o. F on Coumadin PTA for afib. Admission INR 3.35. Plan to hold coumadin and start heparin bridge when INR < 2. Plans noted for cath when INR < 1.7. Cath tentatively scheduled for 7/7 -INR 1.99 this AM, CBC wnl, no bleed documented. Ok to start heparin  Home dose: Coumadin 1.25mg  po daily - last dose 6/29  Goal of Therapy:  Heparin level 0.3-0.7 units/ml Will monitor platelets according to protocol  Plan:  Start heparin at 1000 units/h (no bolus) 8h HL, daily HL/CBC Monitor s/sx bleeding Warfarin on hold for procedure  Elicia Lamp, PharmD, [facility] Clinical Pharmacist Pager [phone] 01/05/2016 8:36 AM

## 2016-01-05 NOTE — Progress Notes (Signed)
Orthopedic Tech Progress Note Patient Details:  Shannon [name] 1931-09-22 [id number]  Ortho Devices Type of Ortho Device: Ace wrap, Unna boot Ortho Device/Splint Location: Bilateral unna boots Ortho Device/Splint Interventions: Application   [place] Pink 01/05/2016, 2:56 PM

## 2016-01-05 NOTE — Progress Notes (Signed)
Hospital Problem List     Principal Problem:   Acute respiratory failure with hypoxia (HCC) Active Problems:   Chronic atrial fibrillation (HCC)   Abdominal pain   CAD (coronary artery disease)   Cardiomyopathy (HCC)   Chronic systolic CHF (congestive heart failure) (HCC)   Elevated troponin   Acute on chronic systolic CHF (congestive heart failure) (HCC)   Leg swelling   SOB (shortness of breath)    Patient Profile:   Primary Cardiologist: Dr. Acie Fredrickson  82 yo Shannon [name] w/ PMH of CAD (s/p DES to [place] in [id number]), chronic systolic CHF, DM, TIA, and chronic atrial fibrillation (on coumadin) who presented to [facility] ED on 12/31/2015 for worsening dyspnea and chest discomfort. EF reduced to 20-25% (previously 50-55% in 04/2015).  Subjective   Breathing at baseline. Still on 1L [place] (will attempt wean today). No chest discomfort or palpitations.   Inpatient Medications    . amiodarone  200 mg Oral Daily  . aspirin EC  81 mg Oral Daily  . atorvastatin  20 mg Oral Daily  . cefTRIAXone (ROCEPHIN)  IV  2 g Intravenous Q24H  . ferrous sulfate  325 mg Oral Q breakfast  . furosemide  40 mg Intravenous BID  . insulin aspart  0-9 Units Subcutaneous TID WC  . LORazepam  0.5 mg Intravenous Once  . metoprolol succinate  37.5 mg Oral BID  . potassium chloride  10 mEq Oral Daily  . saccharomyces boulardii  250 mg Oral BID  . sodium chloride flush  3 mL Intravenous Q12H    Vital Signs    Filed Vitals:   01/04/16 2111 01/05/16 0152 01/05/16 0349 01/05/16 0640  BP: 100/70  99/72   Pulse: 109  110   Temp: 97.Shannon F (36.6 C)  97.9 F (36.6 C) 98.4 F (36.9 C)  TempSrc: Oral  Oral Oral  Resp: 16  16   Height:      Weight:  147 lb 6.4 oz (66.86 kg) 147 lb (66.679 kg)   SpO2: 97%  98%     Intake/Output Summary (Last 24 hours) at 01/05/16 0741 Last data filed at 01/05/16 0644  Gross per 24 hour  Intake    720 ml  Output   2002 ml  Net  -1282 ml   Filed Weights   01/04/16 0333  01/05/16 0152 01/05/16 0349  Weight: 149 lb Shannon oz (67.813 kg) 147 lb 6.4 oz (66.86 kg) 147 lb (66.679 kg)    Physical Exam    General: Elderly Caucasian Shannon [name] appearing in no acute distress. Head: Normocephalic, atraumatic.  Neck: Supple without bruits, JVD not elevated. Lungs:  Resp regular and unlabored, decreased breath sounds at bases bilaterally. Heart: Irregularly irregular, S1, S2, no S3, S4, 2/6 SEM LLSB; no rub. Abdomen: Soft, non-tender, non-distended with normoactive bowel sounds. No hepatomegaly. No rebound/guarding. No obvious abdominal masses. Extremities: No clubbing or cyanosis, 1+ pre-tibial edema bilaterally. Distal pedal pulses are 2+ bilaterally. Neuro: Alert and oriented X 3. Moves all extremities spontaneously. Psych: Normal affect.  Labs    CBC  Recent Labs  01/03/16 0302 01/04/16 0325  WBC 9.2 10.6*  HGB 12.1 12.5  HCT 36.9 40.0  MCV 103.1* 104.7*  PLT 257 [id number]   Basic Metabolic Panel  Recent Labs  01/03/16 0302 01/04/16 0325  NA 137 139  K 3.4* 4.2  CL 102 101  CO2 29 31  GLUCOSE 141* 169*  BUN 16 17  CREATININE 0.81 0.86  CALCIUM 7.9* Shannon.1*  MG  --  1.7    Telemetry    Atrial fibrillation, HR in low-100's - 110's.   ECG    No new tracings.   Cardiac Studies and Radiology    Ct Abdomen Pelvis Wo Contrast  12/31/2015  CLINICAL DATA:  Abdominal pain and nausea for 3 days EXAM: CT ABDOMEN AND PELVIS WITHOUT CONTRAST TECHNIQUE: Multidetector CT imaging of the abdomen and pelvis was performed following the standard protocol without IV contrast. COMPARISON:  None. FINDINGS: There is small to moderate volume ascites. There are unremarkable unenhanced appearances of the liver, spleen, pancreas and adrenals. There are several calculi in the gallbladder lumen measuring up to 4 mm. Kidneys are unremarkable. Urinary bladder is unremarkable. Uterus and adnexal structures are unremarkable. Bowel is unremarkable. Appendix is normal. Enteric contrast  has reached the rectum. There is a large right effusion and a moderate left effusion. There is mild patchy and linear opacity in both lung bases which may be atelectatic but infectious infiltrates cannot be excluded. No significant skeletal lesion is evident. The abdominal aorta is normal in caliber with moderate atherosclerotic calcification. Extensive coronary artery calcified plaque. IMPRESSION: 1. Small to moderate volume ascites. 2. Cholelithiasis. 3. Negative for bowel obstruction, perforation or focal inflammatory change. 4. Bilateral pleura effusions, right greater than left. Lung base opacities may be atelectatic, but infectious infiltrates cannot be excluded. Electronically Signed   By: Andreas Newport M.D.   On: 12/31/2015 06:28   Dg Chest 2 View  12/31/2015  CLINICAL DATA:  Dyspnea, cough and weakness for 3 months. EXAM: CHEST  2 VIEW COMPARISON:  11/16/2015 FINDINGS: There is unchanged moderate cardiomegaly and aortic tortuosity. There is focal airspace consolidation in the left base, as well as curvilinear atelectatic appearing opacities in the lateral lungs. Probable small effusions bilaterally. IMPRESSION: Airspace opacities and effusions bilaterally. This may represent pneumonia. Electronically Signed   By: Andreas Newport M.D.   On: 12/31/2015 00:51    Echocardiogram: 01/01/2016 Study Conclusions  - Left ventricle: The cavity size was normal. Wall thickness was  normal. Systolic function was severely reduced. The estimated  ejection fraction was in the range of 20% to 25%. Diffuse  hypokinesis. - Aortic valve: There was mild regurgitation. - Mitral valve: Calcified annulus. Mildly thickened leaflets .  There was mild regurgitation. - Left atrium: The atrium was moderately dilated. - Right ventricle: The cavity size was mildly dilated. Systolic  function was mildly reduced. - Right atrium: The atrium was severely dilated. - Tricuspid valve: There was severe  regurgitation. - Pulmonary arteries: Systolic pressure was severely increased. PA  peak pressure: 64 mm Hg (S). - Pericardium, extracardiac: A small pericardial effusion was  identified. There was a left pleural effusion.  Impressions: - Severe global reduction in LV function; biatrial enlargement;  mild RVE with mildly reduced function; small pericardial  effusion; mild AI and MR; severe TR with severely elevated  pulmonary pressure.  Assessment & Plan    1. Acute on chronic systolic CHF  - presented with chest discomfort and worsening dyspnea. Echo this admission shows a newly decreased EF of 20-25% with diffuse HK as compared to her prior EF of 50-55% in 04/2015.  Delene Loll held secondary to low BP. Can hopefully resume once off IV diuresis. Toprol-XL increased to 37.5 mg BID on 01/03/2016.  - Net -7.7L since admission. Weight down 18 lbs. Dry weight less than 150 lbs, currently at 147 lbs today. Will switch from IV to PO Lasix. Repeat BMET and  INR in AM prior to cath. - Will need an ischemic evaluation once INR has decreased to less than 1.7.At 1.99 today. Have tentatively added to the cardiac catheterization board for tomorrow morning for a right/left heart cath. The risks and benefits of the procedure have been discussed and she agrees to proceed. Orders entered.  2. CAD  - s/p stenting of OM in 2007. Plan cardiac cath later this week depending on INR. Mild troponin elevation (peak 0.10) suggests more demand ischemia from CHF. - continue ASA, statin, and BB. Will need to start Heparin today per pharmacy consult with INR < 2.0.  3. Diabetes  - with ulceration of toe. Per IM.  - Metformin held.  4. Hyperlipidemia - continue statin therapy.  5. Persistent Atrial Fibrillation - since July 2016. On amiodarone and metoprolol for rate control (Toprol-XL increased to 37.5mg  BID on 01/03/2016). Stopping IV Lasix today, will increase BB dosing as HR remains elevated consistently in  the 110's. Continue to monitor BP closely. - This patients CHA2DS2-VASc Score and unadjusted Ischemic Stroke Rate (% per year) is equal to 12.2 % stroke rate/year from a score of 9 (CHF, HTN, DM, Vascular, Shannon [name], Age (2), TIA(2)). Coumadin on hold for planned invasive procedure. IV heparin per pharmacy.   6. Hypokalemia - currently resolved. Continue to monitor.    Signed, Erma Heritage , PA-C 7:41 AM 01/05/2016   Patient seen and examined and history reviewed. Agree with above findings and plan. Continues to diurese very well. Weight down 18 lbs since admission. Still has LE edema but agree we can switch to po lasix today. INR is trending down. Anticipate she will be able to proceed with cardiac cath in am. BP still too low for Entresto or ARB.  Trexton Escamilla [place], [place] 01/05/2016 Shannon:45 AM

## 2016-01-05 NOTE — Progress Notes (Signed)
[place] for  Heparin Indication: afib  Allergies  Allergen Reactions  . Demerol Other (See Comments)    Burning sensation all over body  . Erythromycin Nausea And Vomiting  . Penicillins Other (See Comments)    unknown  . Percocet [Oxycodone-Acetaminophen] Swelling    Labs:  Recent Labs  01/03/16 0302 01/04/16 0325 01/05/16 0842  WBC 9.2 10.6*  --   HGB 12.1 12.5  --   PLT 257 227  --   CREATININE 0.81 0.86 0.85   Estimated Creatinine Clearance: 45.7 mL/min (by C-G formula based on Cr of 0.85). No results for input(s): VANCOTROUGH, VANCOPEAK, VANCORANDOM, GENTTROUGH, GENTPEAK, GENTRANDOM, TOBRATROUGH, TOBRAPEAK, TOBRARND, AMIKACINPEAK, AMIKACINTROU, AMIKACIN in the last 72 hours.   Microbiology: No results found for this or any previous visit (from the past 720 hour(s)).  Assessment: 82 y.o. F on Coumadin PTA for afib. Admission INR 3.35. Plan to hold coumadin and start heparin bridge when INR < 2. Plans noted for cath when INR < 1.7. Cath tentatively scheduled for 7/7 -INR 1.99 this AM, CBC wnl, no bleed documented.  Heparin level therapeutic this evenings  Home dose: Coumadin 1.25mg  po daily - last dose 6/29  Goal of Therapy:  Heparin level 0.3-0.7 units/ml Will monitor platelets according to protocol  Plan:  Continue heparin at 1000 units / hr Follow up AM labs  Thank you Anette Guarneri, PharmD [phone] 01/05/2016 5:18 PM

## 2016-01-05 NOTE — Consult Note (Signed)
WOC wound consult note Reason for Consult: left great toe wound. Patient familiar to this [place] nurse from previous admission Wound type: full thickness ulcer with venous stasis disease, possible mixed etiology Measurement: 0.5cm x1.0cm x 0.2cm  Wound bed:100% yellow, soft  Drainage (amount, consistency, odor) minimal Periwound: intact  Dressing procedure/placement/frequency: Patient stated some concerns with wound care at home per [facility] and contact with wound care center MD over this.  Currently her daughter provides the wound care at home every other day.  I have contacted the wound care center today to verify the orders.  Currently cleaning with soap and water, apply silver collagen dressing, top with dry dressing. Change dressing every other day.  Patient is using Juxta light compression wraps for edema management at home, however she has not been using these while inpatient and her legs are quite edematous.  I will order Unna's boots for inpatient stay to be changed weekly and wound care to be change every other day while inpatient. The hospital does not carry silver collagen but we can substitute with silver hydrofiber while inpatient.    Discussed POC with patient and bedside nurse.  Re consult if needed, will not follow at this time. Thanks  Pragya Lofaso [employer], RN,CNS, [place] [phone])

## 2016-01-05 NOTE — Care Management Important Message (Signed)
Important Message  Patient Details  Name: Shannon [name] MRN: [id number] Date of Birth: 12-Jul-1931   Medicare Important Message Given:  Yes    [name], [place] 01/05/2016, 10:31 AM

## 2016-01-06 ENCOUNTER — Inpatient Hospital Stay (HOSPITAL_COMMUNITY): Payer: Commercial Managed Care - HMO

## 2016-01-06 ENCOUNTER — Encounter (HOSPITAL_COMMUNITY)
Admission: EM | Disposition: A | Discharge status: Skilled Nursing Facility | Payer: Self-pay | Source: Home / Self Care | Attending: Internal Medicine

## 2016-01-06 ENCOUNTER — Encounter (HOSPITAL_COMMUNITY): Payer: Self-pay | Admitting: Interventional Cardiology

## 2016-01-06 DIAGNOSIS — I5021 Acute systolic (congestive) heart failure: Secondary | ICD-10-CM

## 2016-01-06 DIAGNOSIS — I429 Cardiomyopathy, unspecified: Secondary | ICD-10-CM

## 2016-01-06 HISTORY — PX: CARDIAC CATHETERIZATION: SHX172

## 2016-01-06 LAB — GLUCOSE, CAPILLARY
GLUCOSE-CAPILLARY: 142 mg/dL — AB (ref 65–99)
GLUCOSE-CAPILLARY: 223 mg/dL — AB (ref 65–99)
Glucose-Capillary: 133 mg/dL — ABNORMAL HIGH (ref 65–99)
Glucose-Capillary: 147 mg/dL — ABNORMAL HIGH (ref 65–99)

## 2016-01-06 LAB — CBC
HCT: 35.5 % — ABNORMAL LOW (ref 36.0–46.0)
Hemoglobin: 11.2 g/dL — ABNORMAL LOW (ref 12.0–15.0)
MCH: 33.4 pg (ref 26.0–34.0)
MCHC: 31.5 g/dL (ref 30.0–36.0)
MCV: 106 fL — AB (ref 78.0–100.0)
PLATELETS: 236 10*3/uL (ref 150–400)
RBC: 3.35 MIL/uL — AB (ref 3.87–5.11)
RDW: 18.1 % — AB (ref 11.5–15.5)
WBC: 8.8 10*3/uL (ref 4.0–10.5)

## 2016-01-06 LAB — HEPARIN LEVEL (UNFRACTIONATED): Heparin Unfractionated: 0.57 IU/mL (ref 0.30–0.70)

## 2016-01-06 LAB — POCT I-STAT 3, VENOUS BLOOD GAS (G3P V)
ACID-BASE EXCESS: 5 mmol/L — AB (ref 0.0–2.0)
Bicarbonate: 31.5 mEq/L — ABNORMAL HIGH (ref 20.0–24.0)
O2 SAT: 51 %
PCO2 VEN: 53.4 mmHg — AB (ref 45.0–50.0)
PO2 VEN: 29 mmHg — AB (ref 31.0–45.0)
TCO2: 33 mmol/L (ref 0–100)
pH, Ven: 7.379 — ABNORMAL HIGH (ref 7.250–7.300)

## 2016-01-06 LAB — BASIC METABOLIC PANEL
ANION GAP: 6 (ref 5–15)
BUN: 22 mg/dL — AB (ref 6–20)
CALCIUM: 8.4 mg/dL — AB (ref 8.9–10.3)
CO2: 32 mmol/L (ref 22–32)
CREATININE: 0.89 mg/dL (ref 0.44–1.00)
Chloride: 99 mmol/L — ABNORMAL LOW (ref 101–111)
GFR calc Af Amer: >60 mL/min (ref >60)
GFR, EST NON AFRICAN AMERICAN: 59 mL/min — AB (ref >60)
GLUCOSE: 158 mg/dL — AB (ref 65–99)
Potassium: 4 mmol/L (ref 3.5–5.1)
Sodium: 137 mmol/L (ref 135–145)

## 2016-01-06 LAB — PROTIME-INR
INR: 1.75 — AB (ref 0.00–1.49)
PROTHROMBIN TIME: 20.4 s — AB (ref 11.6–15.2)

## 2016-01-06 LAB — POCT I-STAT 3, ART BLOOD GAS (G3+)
ACID-BASE EXCESS: 3 mmol/L — AB (ref 0.0–2.0)
Bicarbonate: 29.5 mEq/L — ABNORMAL HIGH (ref 20.0–24.0)
O2 Saturation: 88 %
PO2 ART: 57 mmHg — AB (ref 80.0–100.0)
TCO2: 31 mmol/L (ref 0–100)
pCO2 arterial: 51 mmHg — ABNORMAL HIGH (ref 35.0–45.0)
pH, Arterial: 7.371 (ref 7.350–7.450)

## 2016-01-06 SURGERY — RIGHT/LEFT HEART CATH AND CORONARY ANGIOGRAPHY
Anesthesia: LOCAL

## 2016-01-06 MED ORDER — LOSARTAN POTASSIUM 25 MG PO TABS
25.0000 mg | ORAL_TABLET | Freq: Every day | ORAL | Status: DC
  Administered 2016-01-07 – 2016-01-11 (×5): 25 mg via ORAL
  Filled 2016-01-06 (×5): qty 1

## 2016-01-06 MED ORDER — HEPARIN (PORCINE) IN NACL 100-0.45 UNIT/ML-% IJ SOLN
1150.0000 [IU]/h | INTRAMUSCULAR | Status: DC
  Administered 2016-01-06 – 2016-01-08 (×3): 1000 [IU]/h via INTRAVENOUS
  Administered 2016-01-09 – 2016-01-10 (×2): 1050 [IU]/h via INTRAVENOUS
  Filled 2016-01-06 (×5): qty 250

## 2016-01-06 MED ORDER — SODIUM CHLORIDE 0.9 % IV SOLN: 250.0000 mL | INTRAVENOUS | Status: DC | PRN

## 2016-01-06 MED ORDER — ONDANSETRON HCL 4 MG/2ML IJ SOLN: 4.0000 mg | Freq: Four times a day (QID) | INTRAMUSCULAR | Status: DC | PRN

## 2016-01-06 MED ORDER — LIDOCAINE HCL (PF) 1 % IJ SOLN
INTRAMUSCULAR | Status: AC
  Filled 2016-01-06: qty 30

## 2016-01-06 MED ORDER — VERAPAMIL HCL 2.5 MG/ML IV SOLN
INTRAVENOUS | Status: AC
  Filled 2016-01-06: qty 2

## 2016-01-06 MED ORDER — ACETAMINOPHEN 325 MG PO TABS: 650.0000 mg | ORAL_TABLET | ORAL | Status: DC | PRN

## 2016-01-06 MED ORDER — HEPARIN SODIUM (PORCINE) 1000 UNIT/ML IJ SOLN
INTRAMUSCULAR | Status: DC | PRN
  Administered 2016-01-06: 2000 [IU] via INTRAVENOUS

## 2016-01-06 MED ORDER — LIDOCAINE HCL (PF) 1 % IJ SOLN
INTRAMUSCULAR | Status: DC | PRN
  Administered 2016-01-06 (×2): 3 mL

## 2016-01-06 MED ORDER — MIDAZOLAM HCL 2 MG/2ML IJ SOLN
INTRAMUSCULAR | Status: AC
  Filled 2016-01-06: qty 2

## 2016-01-06 MED ORDER — IOPAMIDOL (ISOVUE-370) INJECTION 76%
INTRAVENOUS | Status: AC
  Filled 2016-01-06: qty 100

## 2016-01-06 MED ORDER — SODIUM CHLORIDE 0.9% FLUSH
3.0000 mL | INTRAVENOUS | Status: DC | PRN
  Administered 2016-01-10: 3 mL via INTRAVENOUS
  Filled 2016-01-06: qty 3

## 2016-01-06 MED ORDER — HEPARIN (PORCINE) IN NACL 2-0.9 UNIT/ML-% IJ SOLN
INTRAMUSCULAR | Status: DC | PRN
  Administered 2016-01-06: 1000 mL

## 2016-01-06 MED ORDER — BENZONATATE 100 MG PO CAPS: 200.0000 mg | ORAL_CAPSULE | Freq: Three times a day (TID) | ORAL | Status: DC | PRN

## 2016-01-06 MED ORDER — HEPARIN SODIUM (PORCINE) 1000 UNIT/ML IJ SOLN
INTRAMUSCULAR | Status: AC
  Filled 2016-01-06: qty 1

## 2016-01-06 MED ORDER — WARFARIN - PHARMACIST DOSING INPATIENT
Freq: Every day | Status: DC
  Administered 2016-01-16: 17:00:00

## 2016-01-06 MED ORDER — MIDAZOLAM HCL 2 MG/2ML IJ SOLN
INTRAMUSCULAR | Status: DC | PRN
  Administered 2016-01-06: 0.5 mg via INTRAVENOUS

## 2016-01-06 MED ORDER — SODIUM CHLORIDE 0.9% FLUSH
3.0000 mL | Freq: Two times a day (BID) | INTRAVENOUS | Status: DC
  Administered 2016-01-06 – 2016-01-10 (×6): 3 mL via INTRAVENOUS
  Administered 2016-01-11: 10 mL via INTRAVENOUS

## 2016-01-06 MED ORDER — VERAPAMIL HCL 2.5 MG/ML IV SOLN
INTRAVENOUS | Status: DC | PRN
  Administered 2016-01-06: 08:00:00 via INTRA_ARTERIAL

## 2016-01-06 MED ORDER — HEPARIN (PORCINE) IN NACL 2-0.9 UNIT/ML-% IJ SOLN
INTRAMUSCULAR | Status: AC
  Filled 2016-01-06: qty 1000

## 2016-01-06 MED ORDER — WARFARIN SODIUM 2.5 MG PO TABS
1.2500 mg | ORAL_TABLET | Freq: Once | ORAL | Status: AC
  Administered 2016-01-06: 1.25 mg via ORAL
  Filled 2016-01-06: qty 0.5

## 2016-01-06 MED ORDER — IOPAMIDOL (ISOVUE-370) INJECTION 76%
INTRAVENOUS | Status: DC | PRN
  Administered 2016-01-06: 50 mL via INTRA_ARTERIAL

## 2016-01-06 MED ORDER — FENTANYL CITRATE (PF) 100 MCG/2ML IJ SOLN
INTRAMUSCULAR | Status: DC | PRN
  Administered 2016-01-06: 25 ug via INTRAVENOUS

## 2016-01-06 MED ORDER — SODIUM CHLORIDE 0.9 % WEIGHT BASED INFUSION: 1.0000 mL/kg/h | INTRAVENOUS | Status: AC

## 2016-01-06 MED ORDER — FENTANYL CITRATE (PF) 100 MCG/2ML IJ SOLN
INTRAMUSCULAR | Status: AC
  Filled 2016-01-06: qty 2

## 2016-01-06 SURGICAL SUPPLY — 13 items
CATH BALLN WEDGE 5F 110CM (CATHETERS) ×2 IMPLANT
CATH INFINITI 5 FR JL3.5 (CATHETERS) ×2 IMPLANT
CATH INFINITI 5FR JL4 (CATHETERS) ×2 IMPLANT
CATH INFINITI JR4 5F (CATHETERS) ×2 IMPLANT
DEVICE RAD COMP TR BAND LRG (VASCULAR PRODUCTS) ×2 IMPLANT
GLIDESHEATH SLEND A-KIT 6F 22G (SHEATH) ×2 IMPLANT
KIT HEART LEFT (KITS) ×2 IMPLANT
PACK CARDIAC CATHETERIZATION (CUSTOM PROCEDURE TRAY) ×2 IMPLANT
SHEATH FAST CATH BRACH 5F 5CM (SHEATH) ×2 IMPLANT
TRANSDUCER W/STOPCOCK (MISCELLANEOUS) ×2 IMPLANT
TUBING CIL FLEX 10 FLL-RA (TUBING) ×2 IMPLANT
WIRE HI TORQ VERSACORE-J 145CM (WIRE) ×2 IMPLANT
WIRE SAFE-T 1.5MM-J .035X260CM (WIRE) ×4 IMPLANT

## 2016-01-06 NOTE — Progress Notes (Signed)
Dr. Martinique aware of tonight events and cxr . NO orders given.

## 2016-01-06 NOTE — Interval H&P Note (Signed)
Cath Lab Visit (complete for each Cath Lab visit)  Clinical Evaluation Leading to the Procedure:   ACS: No.  Non-ACS:    Anginal Classification: CCS [name]  Anti-ischemic medical therapy: Maximal Therapy (2 or more classes of medications)  Non-Invasive Test Results: No non-invasive testing performed  Prior CABG: No previous CABG      History and Physical Interval Note:  01/06/2016 7:24 AM  [name]  has presented today for surgery, with the diagnosis of Chest Pain  The various methods of treatment have been discussed with the patient and family. After consideration of risks, benefits and other options for treatment, the patient has consented to  Procedure(s): Right/Left Heart Cath and Coronary Angiography (N/A) as a surgical intervention .  The patient's history has been reviewed, patient examined, no change in status, stable for surgery.  I have reviewed the patient's chart and labs.  Questions were answered to the patient's satisfaction.     Shannon [name]

## 2016-01-06 NOTE — Progress Notes (Signed)
Progress Note    Shannon [name]  [id number] DOB: 1931-08-14  DOA: 12/30/2015 PCP: Haywood Pao, MD    Brief Narrative:   Shannon [name] is an 82 y.o. female with CAD status post stenting, CHF, paroxysmal atrial fibrillation, diabetes mellitus type 2, hyperlipidemia was brought to the ER after patient was complaining of abdominal pain and shortness of breath.. On exam patient also has bilateral lower extremity edema and wound of the left great toe. Chest x-ray shows features concerning for pneumonia and BNP is elevated. Patient at this time will be admitted for acute respiratory failure probably from CHF and pneumonia. 7/7 pt underwent cardiac cath, shows stable CAD, maximize medical therapy.   Assessment/Plan:   Principal Problem:   Acute respiratory failure with hypoxia (HCC) Active Problems:   Chronic atrial fibrillation (HCC)   Abdominal pain   CAD (coronary artery disease)   Cardiomyopathy (HCC)   Chronic systolic CHF (congestive heart failure) (HCC)   Elevated troponin   Acute on chronic systolic CHF (congestive heart failure) (HCC)   Leg swelling   SOB (shortness of breath)   Acute respiratory failure with hypoxia/Acute systolic CHF -Improving  -Likely multi-factorial including heart failure and possible pneumonia -Upon admission to the ED, SPO2 87%, continue oxygen supplementation,plan to wean her off the oxygen.  - started on IV lasix and changed to po lasix today. Weight down by 18 lbs since admission. -Chest x-ray: Airspace opacities with effusions bilaterally. Repeat CXR today.  -Echocardiogram 04/07/2015 showed an EF of 50-55% - repeat Echocardiogram 01/01/2016: EF 20-25%, diffuse hypokinesis, Severe TR with severely elevated pulmonary pressure -Monitor intake and output, daily weights.  -Continue empiric antibiotics. Initially placed on vancomycin and aztreonam- will deescalate to ceftriaxone (patient does have a PCN allergy- but it is  unknown). Complete the course of the antibiotics. Last day of antibiotics 7/7.  -Although ddimer elevated, unlikely PE, as patient's INR has been supratherapeutic.  -LE doppler negative for DVT or SVT -Cardiology consulted and recommendations appreciated. - underwent cardiac cath on 7/7 showed widely patent stents. Minimal CAD in LAD. Mild to mod pulm hypertension.  - worsening cough and sob , increased oxygen requirement. Repeat CXR shows interstitial edema, slightly worse than on admission.  - added on tessalon  Abdominal pain -Improved  -CT abdomen and pelvis: small to moderate volume ascites, cholelithiasis, negative for bowel obstruction, perforation or ambulatory change. Bilateral pleural effusions, right greater than left. -Continue to monitor, pain control  Coronary artery disease -Denies chest pain -Troponin mildly elevated but trending downward (0.1, 0.07, 0.06, 0.05) -Continue aspirin, statin, metoprolol.  - cardiac cath shows minimal CAD in LAD, patent stents.    Atrial fibrillation, chronic -CHADSVASC 4 (age, gender, DM) -Coumadin held, INR therapeutic. Rate slightly elevated, metoprolol increased by cardiology. Heparin started as INR is 1.9, resume coumadin, monitor INR.   Left great toe diabetic wound: Wound care consulted and resume local wound care.  No surrounding cellulitis.   Diabetes mellitus, type II -Metformin held, continued findings for CBG monitoring CBG (last 3)   Recent Labs  01/06/16 0621 01/06/16 1117 01/06/16 1640  GLUCAP 142* 223* 133*    Resume SSI.   Hyperlipidemia -Continue statin  Anemia of chronic disease.  -Continue iron supplementation -Baseline hemoglobin approximately 10, currently around 12.   Hypokalemia/hypomagnesemia -Secondary to diuretics - repleted and repeat in am shows normal values.    Family Communication/Anticipated D/C date and plan/Code Status   DVT prophylaxis: IV heparin Code Status:  Full Code.    Family Communication: none at bedside.  Disposition Plan: pending further investigation followed by PT evaluation. Plan for cardiac cath in am.   Medical Consultants:    cardiology   Procedures:   Cardiac cath scheduled for 7/7 Anti-Infectives:   Anti-infectives    Start     Dose/Rate Route Frequency Ordered Stop   01/02/16 1300  cefTRIAXone (ROCEPHIN) 2 g in dextrose 5 % 50 mL IVPB     2 g 100 mL/hr over 30 Minutes Intravenous Every 24 hours 01/02/16 1156     01/01/16 0500  vancomycin (VANCOCIN) IVPB 1000 mg/200 mL premix  Status:  Discontinued     1,000 mg 200 mL/hr over 60 Minutes Intravenous Every 24 hours 12/31/15 0440 01/02/16 1156   12/31/15 1400  aztreonam (AZACTAM) 1 g in dextrose 5 % 50 mL IVPB  Status:  Discontinued     1 g 100 mL/hr over 30 Minutes Intravenous Every 8 hours 12/31/15 0440 01/02/16 1156   12/31/15 0600  aztreonam (AZACTAM) 2 g in dextrose 5 % 50 mL IVPB  Status:  Discontinued     2 g 100 mL/hr over 30 Minutes Intravenous Every 8 hours 12/31/15 0428 12/31/15 0435   12/31/15 0130  aztreonam (AZACTAM) 1 g in dextrose 5 % 50 mL IVPB     1 g 100 mL/hr over 30 Minutes Intravenous  Once 12/31/15 0121 12/31/15 0338   12/31/15 0130  vancomycin (VANCOCIN) IVPB 1000 mg/200 mL premix     1,000 mg 200 mL/hr over 60 Minutes Intravenous NOW 12/31/15 0122 12/31/15 0247      Subjective:    Shannon [name] denies any new pain, But reports worsening cough and sob.   Objective:    Filed Vitals:   01/06/16 1012 01/06/16 1034 01/06/16 1048 01/06/16 1323  BP: 101/69 108/81 103/76 105/77  Pulse: 104 106 97 92  Temp:    98.6 F (37 C)  TempSrc:    Oral  Resp:      Height:      Weight:      SpO2: 90% 90% 94% 92%    Intake/Output Summary (Last 24 hours) at 01/06/16 1807 Last data filed at 01/06/16 1722  Gross per 24 hour  Intake    600 ml  Output    901 ml  Net   -301 ml   Filed Weights   01/05/16 0152 01/05/16 0349 01/06/16 0528  Weight:  66.86 kg (147 lb 6.4 oz) 66.679 kg (147 lb) 67.178 kg (148 lb 1.6 oz)    Exam: General exam: Appears calm and comfortable. Not in distress on 2 lit of [place] oxygen.  Respiratory system: Clear to auscultation. Respiratory effort normal. Cardiovascular system: S1 & S2 heard, RRR. No JVD,  rubs, gallops or clicks. No murmurs. Gastrointestinal system: Abdomen is nondistended, soft and nontender. No organomegaly or masses felt. Normal bowel sounds heard. Central nervous system: Alert and oriented. No  New focal neurological deficits. Extremities: bilateral 2 + edema. Left great toe wound.  Psychiatry: Judgement and insight appear normal. Mood & affect appropriate.   Data Reviewed:   I have personally reviewed following labs and imaging studies:  Labs: Basic Metabolic Panel:  Recent Labs Lab 01/02/16 0306 01/03/16 0302 01/04/16 0325 01/05/16 0842 01/06/16 0258  NA 136 137 139 138 137  K 3.3* 3.4* 4.2 4.1 4.0  CL 102 102 101 100* 99*  CO2 27 29 31 30  32  GLUCOSE 123* 141* 169* 167* 158*  BUN  18 16 17 17  22*  CREATININE 0.93 0.81 0.86 0.85 0.89  CALCIUM 8.1* 7.9* 8.1* 8.5* 8.4*  MG 1.3*  --  1.7  --   --    GFR Estimated Creatinine Clearance: 43.8 mL/min (by C-G formula based on Cr of 0.89). Liver Function Tests:  Recent Labs Lab 12/30/15 2327 12/31/15 0525  AST 39 34  ALT 28 27  ALKPHOS 202* 197*  BILITOT 1.3* 1.2  PROT 5.9* 6.0*  ALBUMIN 2.7* 2.5*    Recent Labs Lab 12/30/15 2327 12/31/15 0525  LIPASE 41 38   No results for input(s): AMMONIA in the last 168 hours. Coagulation profile  Recent Labs Lab 01/02/16 0306 01/03/16 0302 01/04/16 0325 01/05/16 0330 01/06/16 0258  INR 2.83* 2.85* 2.38* 1.99* 1.75*    CBC:  Recent Labs Lab 12/31/15 0525 01/02/16 0306 01/03/16 0302 01/04/16 0325 01/06/16 0258  WBC 8.1 6.7 9.2 10.6* 8.8  NEUTROABS 6.3  --   --   --   --   HGB 11.7* 10.9* 12.1 12.5 11.2*  HCT 37.0 35.1* 36.9 40.0 35.5*  MCV 103.9* 103.8*  103.1* 104.7* 106.0*  PLT 263 216 257 227 236   Cardiac Enzymes:  Recent Labs Lab 12/30/15 2346 12/31/15 0525 12/31/15 1022 12/31/15 1614  TROPONINI 0.10* 0.07* 0.06* 0.05*   BNP (last 3 results) No results for input(s): PROBNP in the last 8760 hours. CBG:  Recent Labs Lab 01/05/16 1618 01/05/16 2127 01/06/16 0621 01/06/16 1117 01/06/16 1640  GLUCAP 149* 143* 142* 223* 133*   D-Dimer: No results for input(s): DDIMER in the last 72 hours. Hgb A1c: No results for input(s): HGBA1C in the last 72 hours. Lipid Profile: No results for input(s): CHOL, HDL, LDLCALC, TRIG, CHOLHDL, LDLDIRECT in the last 72 hours. Thyroid function studies: No results for input(s): TSH, T4TOTAL, T3FREE, THYROIDAB in the last 72 hours.  Invalid input(s): FREET3 Anemia work up: No results for input(s): VITAMINB12, FOLATE, FERRITIN, TIBC, IRON, RETICCTPCT in the last 72 hours. Sepsis Labs:  Recent Labs Lab 01/02/16 0306 01/03/16 0302 01/04/16 0325 01/06/16 0258  WBC 6.7 9.2 10.6* 8.8   Urine analysis:    Component Value Date/Time   COLORURINE YELLOW 12/30/2015 2320   APPEARANCEUR CLOUDY* 12/30/2015 2320   LABSPEC 1.020 12/30/2015 2320   PHURINE 5.0 12/30/2015 2320   GLUCOSEU NEGATIVE 12/30/2015 2320   HGBUR NEGATIVE 12/30/2015 2320   BILIRUBINUR NEGATIVE 12/30/2015 2320   KETONESUR NEGATIVE 12/30/2015 2320   PROTEINUR 30* 12/30/2015 2320   UROBILINOGEN 0.2 10/09/2012 1551   NITRITE NEGATIVE 12/30/2015 2320   LEUKOCYTESUR SMALL* 12/30/2015 2320   Microbiology No results found for this or any previous visit (from the past 240 hour(s)).  Radiology: Dg Chest 2 View  01/05/2016  CLINICAL DATA:  Preoperative evaluation prior to cardiac catheterization which is scheduled for tomorrow. Shortness of breath and generalized weakness. EXAM: CHEST  2 VIEW COMPARISON:  12/31/2015, 11/16/2015 and earlier. FINDINGS: Cardiac silhouette mildly to moderately enlarged, unchanged. Thoracic aorta  tortuous and atherosclerotic, unchanged. Hilar and mediastinal contours otherwise unremarkable. Mild interstitial and perihilar airspace pulmonary edema, slightly worsened since the examination 5 days ago. Moderate-sized bilateral pleural effusions, unchanged, with associated dense consolidation in the lower lobes. Abdominal aortic atherosclerosis without aneurysm. Osseous demineralization, degenerative changes in both shoulders, thoracic dextroscoliosis with compensatory thoracolumbar levoscoliosis. IMPRESSION: 1. Mild CHF with stable mild to moderate cardiomegaly and mild interstitial and perihilar airspace pulmonary edema. This has worsened slightly since the examination 5 days ago. 2. Stable moderate-sized bilateral pleural  effusions and associated dense passive atelectasis in the lower lobes. 3. Thoracic and abdominal aortic atherosclerosis. Electronically Signed   By: Evangeline Dakin M.D.   On: 01/05/2016 14:18   Dg Chest Port 1 View  01/06/2016  CLINICAL DATA:  Acute onset of shortness of breath and cough. Preoperative chest radiograph. Initial encounter. EXAM: PORTABLE CHEST 1 VIEW COMPARISON:  Chest radiograph performed 01/05/2016 FINDINGS: Small bilateral pleural effusions are noted. Mildly increased bilateral airspace opacification may reflect pulmonary edema or pneumonia. No pneumothorax is seen. The cardiomediastinal silhouette is mildly enlarged. No acute osseous abnormalities are identified. IMPRESSION: Small bilateral pleural effusions noted. Mildly increased bilateral airspace opacification may reflect pulmonary edema or pneumonia. Mild cardiomegaly. Electronically Signed   By: Garald Balding M.D.   On: 01/06/2016 06:48    Medications:   . amiodarone  200 mg Oral Daily  . atorvastatin  20 mg Oral Daily  . cefTRIAXone (ROCEPHIN)  IV  2 g Intravenous Q24H  . ferrous sulfate  325 mg Oral Q breakfast  . furosemide  40 mg Oral Daily  . insulin aspart  0-9 Units Subcutaneous TID WC  .  LORazepam  0.5 mg Intravenous Once  . losartan  25 mg Oral Daily  . metoprolol succinate  50 mg Oral BID  . potassium chloride  10 mEq Oral Daily  . saccharomyces boulardii  250 mg Oral BID  . sodium chloride flush  3 mL Intravenous Q12H  . sodium chloride flush  3 mL Intravenous Q12H  . warfarin  1.25 mg Oral ONCE-1800  . Warfarin - Pharmacist Dosing Inpatient   Does not apply q1800   Continuous Infusions: . heparin 1,000 Units/hr (01/06/16 1628)    Time spent: 25 minutes.    LOS: 6 days   [facility]  [facility] Pager [phone]  *Please refer to [email or web address], password [email or web address] to get updated schedule on who will round on this patient, as hospitalists switch teams weekly. If 7PM-7AM, please contact night-coverage at [email or web address], password [email or web address] for any overnight needs.  01/06/2016, 6:07 PM

## 2016-01-06 NOTE — Progress Notes (Signed)
[place] for  Heparin Indication: afib  Allergies  Allergen Reactions  . Demerol Other (See Comments)    Burning sensation all over body  . Erythromycin Nausea And Vomiting  . Penicillins Other (See Comments)    unknown  . Percocet [Oxycodone-Acetaminophen] Swelling    Labs:  Recent Labs  01/04/16 0325 01/05/16 0842 01/06/16 0258  WBC 10.6*  --  8.8  HGB 12.5  --  11.2*  PLT 227  --  236  CREATININE 0.86 0.85 0.89   Estimated Creatinine Clearance: 43.8 mL/min (by C-G formula based on Cr of 0.89). No results for input(s): VANCOTROUGH, VANCOPEAK, VANCORANDOM, GENTTROUGH, GENTPEAK, GENTRANDOM, TOBRATROUGH, TOBRAPEAK, TOBRARND, AMIKACINPEAK, AMIKACINTROU, AMIKACIN in the last 72 hours.   Microbiology: No results found for this or any previous visit (from the past 720 hour(s)).  Assessment: 82 y.o. F on Coumadin PTA for afib and has been on hold for cath. Cath completed today and to resume heparin/coumadin post cath (8 hours post sheath removal; done at ~ 8:30am)  Home dose: Coumadin 1.25mg  po daily  Goal of Therapy:  INR goal 2-3 Heparin level 0.3-0.7 units/ml Will monitor platelets according to protocol  Plan:  -Restart  heparin at 1000 units / hr at 4:30pm -Heparin level in 8 hours and daily wth CBC daily  Hildred Laser, Pharm D 01/06/2016 10:34 AM

## 2016-01-06 NOTE — Progress Notes (Addendum)
Patient c/o of sob resp. Even and unlab. o2 3 l/m n.c. sats 96 5. resp 20 . Skin warm and dry. Page N.P. On call see orders Patient also has dry cough

## 2016-01-06 NOTE — Progress Notes (Signed)
Hospital Problem List     Principal Problem:   Acute respiratory failure with hypoxia (HCC) Active Problems:   Chronic atrial fibrillation (HCC)   Abdominal pain   CAD (coronary artery disease)   Cardiomyopathy (HCC)   Chronic systolic CHF (congestive heart failure) (HCC)   Elevated troponin   Acute on chronic systolic CHF (congestive heart failure) (HCC)   Leg swelling   SOB (shortness of breath)    Patient Profile:   Primary Cardiologist: Dr. Acie Fredrickson  82 yo female w/ PMH of CAD (s/p DES to [place] in [id number]), chronic systolic CHF, DM, TIA, and chronic atrial fibrillation (on coumadin) who presented to [facility] ED on 12/31/2015 for worsening dyspnea and chest discomfort. EF reduced to 20-25% (previously 50-55% in 04/2015).  Subjective   Breathing is OK. Oxygen was increased this am with SOB and cough.  No chest discomfort or palpitations.   Inpatient Medications    . amiodarone  200 mg Oral Daily  . aspirin EC  81 mg Oral Daily  . atorvastatin  20 mg Oral Daily  . cefTRIAXone (ROCEPHIN)  IV  2 g Intravenous Q24H  . ferrous sulfate  325 mg Oral Q breakfast  . furosemide  40 mg Oral Daily  . insulin aspart  0-9 Units Subcutaneous TID WC  . LORazepam  0.5 mg Intravenous Once  . metoprolol succinate  50 mg Oral BID  . potassium chloride  10 mEq Oral Daily  . saccharomyces boulardii  250 mg Oral BID  . sodium chloride flush  3 mL Intravenous Q12H  . sodium chloride flush  3 mL Intravenous Q12H    Vital Signs    Filed Vitals:   01/06/16 0850 01/06/16 0857 01/06/16 0921 01/06/16 0937  BP: 100/64 92/59 97/57  109/77  Pulse: 107 105 110 93  Temp:      TempSrc:      Resp:      Height:      Weight:      SpO2: 95% 94% 92% 92%    Intake/Output Summary (Last 24 hours) at 01/06/16 0953 Last data filed at 01/06/16 0612  Gross per 24 hour  Intake    720 ml  Output    800 ml  Net    -80 ml   Filed Weights   01/05/16 0152 01/05/16 0349 01/06/16 0528  Weight: 147 lb 6.4  oz (66.86 kg) 147 lb (66.679 kg) 148 lb 1.6 oz (67.178 kg)    Physical Exam    General: Elderly Caucasian female appearing in no acute distress. Head: Normocephalic, atraumatic.  Neck: Supple without bruits, JVD not elevated. Lungs:  Resp regular and unlabored, decreased breath sounds at bases bilaterally. Heart: Irregularly irregular, S1, S2, no S3, S4, 2/6 SEM LLSB; no rub. Abdomen: Soft, non-tender, non-distended with normoactive bowel sounds. No hepatomegaly. No rebound/guarding. No obvious abdominal masses. Extremities: No clubbing or cyanosis, now with Una boots on. Distal pedal pulses are 2+ bilaterally. Neuro: Alert and oriented X 3. Moves all extremities spontaneously. Psych: Normal affect.  Labs    CBC  Recent Labs  01/04/16 0325 01/06/16 0258  WBC 10.6* 8.8  HGB 12.5 11.2*  HCT 40.0 35.5*  MCV 104.7* 106.0*  PLT 227 [id number]   Basic Metabolic Panel  Recent Labs  01/04/16 0325 01/05/16 0842 01/06/16 0258  NA 139 138 137  K 4.2 4.1 4.0  CL 101 100* 99*  CO2 31 30 32  GLUCOSE 169* 167* 158*  BUN 17 17 22*  CREATININE 0.86 0.85 0.89  CALCIUM 8.1* 8.5* 8.4*  MG 1.7  --   --     Telemetry    Atrial fibrillation, HR 100-105.   ECG    No new tracings.   Cardiac Studies and Radiology    Ct Abdomen Pelvis Wo Contrast  12/31/2015  CLINICAL DATA:  Abdominal pain and nausea for 3 days EXAM: CT ABDOMEN AND PELVIS WITHOUT CONTRAST TECHNIQUE: Multidetector CT imaging of the abdomen and pelvis was performed following the standard protocol without IV contrast. COMPARISON:  None. FINDINGS: There is small to moderate volume ascites. There are unremarkable unenhanced appearances of the liver, spleen, pancreas and adrenals. There are several calculi in the gallbladder lumen measuring up to 4 mm. Kidneys are unremarkable. Urinary bladder is unremarkable. Uterus and adnexal structures are unremarkable. Bowel is unremarkable. Appendix is normal. Enteric contrast has reached the  rectum. There is a large right effusion and a moderate left effusion. There is mild patchy and linear opacity in both lung bases which may be atelectatic but infectious infiltrates cannot be excluded. No significant skeletal lesion is evident. The abdominal aorta is normal in caliber with moderate atherosclerotic calcification. Extensive coronary artery calcified plaque. IMPRESSION: 1. Small to moderate volume ascites. 2. Cholelithiasis. 3. Negative for bowel obstruction, perforation or focal inflammatory change. 4. Bilateral pleura effusions, right greater than left. Lung base opacities may be atelectatic, but infectious infiltrates cannot be excluded. Electronically Signed   By: Andreas Newport M.D.   On: 12/31/2015 06:28   Dg Chest 2 View  12/31/2015  CLINICAL DATA:  Dyspnea, cough and weakness for 3 months. EXAM: CHEST  2 VIEW COMPARISON:  11/16/2015 FINDINGS: There is unchanged moderate cardiomegaly and aortic tortuosity. There is focal airspace consolidation in the left base, as well as curvilinear atelectatic appearing opacities in the lateral lungs. Probable small effusions bilaterally. IMPRESSION: Airspace opacities and effusions bilaterally. This may represent pneumonia. Electronically Signed   By: Andreas Newport M.D.   On: 12/31/2015 00:51    Echocardiogram: 01/01/2016 Study Conclusions  - Left ventricle: The cavity size was normal. Wall thickness was  normal. Systolic function was severely reduced. The estimated  ejection fraction was in the range of 20% to 25%. Diffuse  hypokinesis. - Aortic valve: There was mild regurgitation. - Mitral valve: Calcified annulus. Mildly thickened leaflets .  There was mild regurgitation. - Left atrium: The atrium was moderately dilated. - Right ventricle: The cavity size was mildly dilated. Systolic  function was mildly reduced. - Right atrium: The atrium was severely dilated. - Tricuspid valve: There was severe regurgitation. - Pulmonary  arteries: Systolic pressure was severely increased. PA  peak pressure: 64 mm Hg (S). - Pericardium, extracardiac: A small pericardial effusion was  identified. There was a left pleural effusion.  Impressions: - Severe global reduction in LV function; biatrial enlargement;  mild RVE with mildly reduced function; small pericardial  effusion; mild AI and MR; severe TR with severely elevated  pulmonary pressure.  Right/Left Heart Cath and Coronary Angiography    Conclusion    1. Ost 2nd Mrg to 2nd Mrg lesion, 10% stenosed. The lesion was previously treated with a stent (unknown type).   Widely patent circumflex/obtuse marginal stent.  Minimal nonobstructive coronary disease noted in the LAD. Nondominant widely patent right coronary.  Severe left ventricular systolic dysfunction with estimated EF 25%. Normal filling pressures with capillary wedge 16 mmHg an LVEDP 15 millimeters mercury  The cardiac output 3.96 L/m  Mild-to-moderate pulmonary hypertension.  RECOMMENDATIONS:   Etiology of cardiomyopathy is uncertain. Better rate control is needed to exclude the possibility of tachycardia mediated reduction in LV systolic function. I is serum that hyperthyroidism has been excluded.  Medical therapy as tolerated.  IV heparin resumed until therapeutic INR on Coumadin.     Assessment & Plan    1. Acute on chronic systolic CHF  - presented with chest discomfort and worsening dyspnea. Echo this admission shows a newly decreased EF of 20-25% with diffuse HK as compared to her prior EF of 50-55% in 04/2015. EF 25% by cath.  - unable to tolerate Entresto due to low BP. On metoprolol 50 mg bid now. On oral lasix 40 mg daily. Will start low dose ARB today. LV filling pressures and right heart pressures are normal.  - Net -7.9L since admission. Weight down 18 lbs. Dry weight less than 150 lbs, currently at 147 lbs today.    2. CAD  - s/p stenting of OM in 2007. Cardiac cath  shows nonobstructive disease. Will discontinue ASA since she is on coumadin.  - continue statin, and BB. Will need to restart Heparin today per pharmacy consult. Resume coumadin. When INR >2 can stop heparin.  3. Diabetes  - with ulceration of toe. Per IM.  - Metformin held.  4. Hyperlipidemia - continue statin therapy.  5. Persistent Atrial Fibrillation - since July 2016. On amiodarone and metoprolol for rate control (Toprol-XL increased from 25 mg bid to 50 mg bid.  Rate control satisfactory for now.  - This patients CHA2DS2-VASc Score and unadjusted Ischemic Stroke Rate (% per year) is equal to 12.2 % stroke rate/year from a score of 9 (CHF, HTN, DM, Vascular, Female, Age (2), TIA(2)). Coumadin to be resumed post cath.  6. Hypokalemia - currently resolved. Continue to monitor.    Signed,   Thandiwe Sir[place], [place] 01/06/2016 9:53 AM

## 2016-01-06 NOTE — Progress Notes (Signed)
Pt returned from cath lab. TR band in place, R radial level 0. VSS. Pt re-connected to tele monitor, given food and drink. Son at beside. Call bell within reach. Will continue to monitor.

## 2016-01-06 NOTE — Research (Signed)
LEADERS FREE II Informed Consent   Subject Name: [name]  Subject met inclusion and exclusion criteria.  The informed consent form, study requirements and expectations were reviewed with the subject and questions and concerns were addressed prior to the signing of the consent form.  The subject verbalized understanding of the trail requirements.  The subject agreed to participate in the LEADERS FREE II trial and signed the informed consent.  The informed consent was obtained prior to performance of any protocol-specific procedures for the subject.  A copy of the signed informed consent was given to the subject and a copy was placed in the subject's medical record.  Hedrick,Andjela Wickes W 01/06/2016, [id number]

## 2016-01-06 NOTE — Care Management Note (Signed)
Case Management Note  Patient Details  Name: [name] MRN: [id number] Date of Birth: 09/07/1931  Subjective/Objective:                    Action/Plan: Pt is s/p cath. She continues with IV rocephin and heparin. CM continues to follow for discharge needs.   Expected Discharge Date:                  Expected Discharge Plan:  [place]  In-House Referral:     Discharge planning Services  CM Consult  Post Acute Care Choice:    Choice offered to:     DME Arranged:    DME Agency:     HH Arranged:    [place] Agency:     Status of Service:  In process, will continue to follow  If discussed at Long Length of Stay Meetings, dates discussed:    Additional Comments:  Pollie Friar, RN 01/06/2016, 4:33 PM

## 2016-01-06 NOTE — Progress Notes (Signed)
OT Cancellation Note  Patient Details Name: RONNISHA TATGE MRN: [id number] DOB: 01-08-1932   Cancelled Treatment:    Reason Eval/Treat Not Completed: Patient at procedure or test/ unavailable. Pt just returned from cath lab, in process of having TR band removed. Will check back later today if time allows.  Redmond Baseman, OTR/L Pager: [phone] 01/06/2016, 10:14 AM

## 2016-01-06 NOTE — Progress Notes (Signed)
PT Cancellation Note  Patient Details Name: [name] MRN: [id number] DOB: [birth year]   Cancelled Treatment:    Reason Eval/Treat Not Completed: Patient declined, no reason specifiedPt declined mobility or therex. Encouraged pt to ambulate with nursing staff this evening. PT will continue to follow acutely.    Salina April, PTA Pager: [phone]   01/06/2016, 3:22 PM

## 2016-01-06 NOTE — Plan of Care (Signed)
Problem: Consults Goal: Cardiac Cath Patient Education (See Patient Education module for education specifics.)  Outcome: Not Applicable Date Met:  82/88/33 Patient stated she has had them in the past and did not want to see video on cath.

## 2016-01-06 NOTE — H&P (View-Only) (Signed)
Hospital Problem List     Principal Problem:   Acute respiratory failure with hypoxia (HCC) Active Problems:   Chronic atrial fibrillation (HCC)   Abdominal pain   CAD (coronary artery disease)   Cardiomyopathy (HCC)   Chronic systolic CHF (congestive heart failure) (HCC)   Elevated troponin   Acute on chronic systolic CHF (congestive heart failure) (HCC)   Leg swelling   SOB (shortness of breath)    Patient Profile:   Primary Cardiologist: Dr. Acie [name]  82 yo female w/ PMH of CAD (s/p DES to [place] in [id number]), chronic systolic CHF, DM, TIA, and chronic atrial fibrillation (on coumadin) who presented to [name] on 12/31/2015 for worsening dyspnea and chest discomfort. EF reduced to 20-25% (previously 50-55% in 04/2015).  Subjective   Breathing at baseline. Still on 1L [place] (will attempt wean today). No chest discomfort or palpitations.   Inpatient Medications    . amiodarone  200 mg Oral Daily  . aspirin EC  81 mg Oral Daily  . atorvastatin  20 mg Oral Daily  . cefTRIAXone (ROCEPHIN)  IV  2 g Intravenous Q24H  . ferrous sulfate  325 mg Oral Q breakfast  . furosemide  40 mg Intravenous BID  . insulin aspart  0-9 Units Subcutaneous TID WC  . LORazepam  0.5 mg Intravenous Once  . metoprolol succinate  37.5 mg Oral BID  . potassium chloride  10 mEq Oral Daily  . saccharomyces boulardii  250 mg Oral BID  . sodium chloride flush  3 mL Intravenous Q12H    Vital Signs    Filed Vitals:   01/04/16 2111 01/05/16 0152 01/05/16 0349 01/05/16 0640  BP: 100/70  99/72   Pulse: 109  110   Temp: 97.8 F (36.6 C)  97.9 F (36.6 C) 98.4 F (36.9 C)  TempSrc: Oral  Oral Oral  Resp: 16  16   Height:      Weight:  147 lb 6.4 oz (66.86 kg) 147 lb (66.679 kg)   SpO2: 97%  98%     Intake/Output Summary (Last 24 hours) at 01/05/16 0741 Last data filed at 01/05/16 0644  Gross per 24 hour  Intake    720 ml  Output   2002 ml  Net  -1282 ml   Filed Weights   01/04/16 0333  01/05/16 0152 01/05/16 0349  Weight: 149 lb 8 oz (67.813 kg) 147 lb 6.4 oz (66.86 kg) 147 lb (66.679 kg)    Physical Exam    General: Elderly Caucasian female appearing in no acute distress. Head: Normocephalic, atraumatic.  Neck: Supple without bruits, JVD not elevated. Lungs:  Resp regular and unlabored, decreased breath sounds at bases bilaterally. Heart: Irregularly irregular, S1, S2, no S3, S4, 2/6 SEM LLSB; no rub. Abdomen: Soft, non-tender, non-distended with normoactive bowel sounds. No hepatomegaly. No rebound/guarding. No obvious abdominal masses. Extremities: No clubbing or cyanosis, 1+ pre-tibial edema bilaterally. Distal pedal pulses are 2+ bilaterally. Neuro: Alert and oriented X 3. Moves all extremities spontaneously. Psych: Normal affect.  Labs    CBC  Recent Labs  01/03/16 0302 01/04/16 0325  WBC 9.2 10.6*  HGB 12.1 12.5  HCT 36.9 40.0  MCV 103.1* 104.7*  PLT 257 [id number]   Basic Metabolic Panel  Recent Labs  01/03/16 0302 01/04/16 0325  NA 137 139  K 3.4* 4.2  CL 102 101  CO2 29 31  GLUCOSE 141* 169*  BUN 16 17  CREATININE 0.81 0.86  CALCIUM 7.9* 8.1*  MG  --  1.7    Telemetry    Atrial fibrillation, HR in low-100's - 110's.   ECG    No new tracings.   Cardiac Studies and Radiology    Ct Abdomen Pelvis Wo Contrast  12/31/2015  CLINICAL DATA:  Abdominal pain and nausea for 3 days EXAM: CT ABDOMEN AND PELVIS WITHOUT CONTRAST TECHNIQUE: Multidetector CT imaging of the abdomen and pelvis was performed following the standard protocol without IV contrast. COMPARISON:  None. FINDINGS: There is small to moderate volume ascites. There are unremarkable unenhanced appearances of the liver, spleen, pancreas and adrenals. There are several calculi in the gallbladder lumen measuring up to 4 mm. Kidneys are unremarkable. Urinary bladder is unremarkable. Uterus and adnexal structures are unremarkable. Bowel is unremarkable. Appendix is normal. Enteric contrast  has reached the rectum. There is a large right effusion and a moderate left effusion. There is mild patchy and linear opacity in both lung bases which may be atelectatic but infectious infiltrates cannot be excluded. No significant skeletal lesion is evident. The abdominal aorta is normal in caliber with moderate atherosclerotic calcification. Extensive coronary artery calcified plaque. IMPRESSION: 1. Small to moderate volume ascites. 2. Cholelithiasis. 3. Negative for bowel obstruction, perforation or focal inflammatory change. 4. Bilateral pleura effusions, right greater than left. Lung base opacities may be atelectatic, but infectious infiltrates cannot be excluded. Electronically Signed   By: [name] M.D.   On: 12/31/2015 06:28   Dg Chest 2 View  12/31/2015  CLINICAL DATA:  Dyspnea, cough and weakness for 3 months. EXAM: CHEST  2 VIEW COMPARISON:  11/16/2015 FINDINGS: There is unchanged moderate cardiomegaly and aortic tortuosity. There is focal airspace consolidation in the left base, as well as curvilinear atelectatic appearing opacities in the lateral lungs. Probable small effusions bilaterally. IMPRESSION: Airspace opacities and effusions bilaterally. This may represent pneumonia. Electronically Signed   By: [name] M.D.   On: 12/31/2015 00:51    Echocardiogram: 01/01/2016 Study Conclusions  - Left ventricle: The cavity size was normal. Wall thickness was  normal. Systolic function was severely reduced. The estimated  ejection fraction was in the range of 20% to 25%. Diffuse  hypokinesis. - Aortic valve: There was mild regurgitation. - Mitral valve: Calcified annulus. Mildly thickened leaflets .  There was mild regurgitation. - Left atrium: The atrium was moderately dilated. - Right ventricle: The cavity size was mildly dilated. Systolic  function was mildly reduced. - Right atrium: The atrium was severely dilated. - Tricuspid valve: There was severe  regurgitation. - Pulmonary arteries: Systolic pressure was severely increased. PA  peak pressure: 64 mm Hg (S). - Pericardium, extracardiac: A small pericardial effusion was  identified. There was a left pleural effusion.  Impressions: - Severe global reduction in LV function; biatrial enlargement;  mild RVE with mildly reduced function; small pericardial  effusion; mild AI and MR; severe TR with severely elevated  pulmonary pressure.  Assessment & Plan    1. Acute on chronic systolic CHF  - presented with chest discomfort and worsening dyspnea. Echo this admission shows a newly decreased EF of 20-25% with diffuse HK as compared to her prior EF of 50-55% in 04/2015.  Delene Loll held secondary to low BP. Can hopefully resume once off IV diuresis. Toprol-XL increased to 37.5 mg BID on 01/03/2016.  - Net -7.7L since admission. Weight down 18 lbs. Dry weight less than 150 lbs, currently at 147 lbs today. Will switch from IV to PO Lasix. Repeat BMET and  INR in AM prior to cath. - Will need an ischemic evaluation once INR has decreased to less than 1.7.At 1.99 today. Have tentatively added to the cardiac catheterization board for tomorrow morning for a right/left heart cath. The risks and benefits of the procedure have been discussed and she agrees to proceed. Orders entered.  2. CAD  - s/p stenting of OM in 2007. Plan cardiac cath later this week depending on INR. Mild troponin elevation (peak 0.10) suggests more demand ischemia from CHF. - continue ASA, statin, and BB. Will need to start Heparin today per pharmacy consult with INR < 2.0.  3. Diabetes  - with ulceration of toe. Per IM.  - Metformin held.  4. Hyperlipidemia - continue statin therapy.  5. Persistent Atrial Fibrillation - since July 2016. On amiodarone and metoprolol for rate control (Toprol-XL increased to 37.5mg  BID on 01/03/2016). Stopping IV Lasix today, will increase BB dosing as HR remains elevated consistently in  the 110's. Continue to monitor BP closely. - This patients CHA2DS2-VASc Score and unadjusted Ischemic Stroke Rate (% per year) is equal to 12.2 % stroke rate/year from a score of 9 (CHF, HTN, DM, Vascular, Female, Age (2), TIA(2)). Coumadin on hold for planned invasive procedure. IV heparin per pharmacy.   6. Hypokalemia - currently resolved. Continue to monitor.    Signed, Erma Heritage , PA-C 7:41 AM 01/05/2016   Patient seen and examined and history reviewed. Agree with above findings and plan. Continues to diurese very well. Weight down 18 lbs since admission. Still has LE edema but agree we can switch to po lasix today. INR is trending down. Anticipate she will be able to proceed with cardiac cath in am. BP still too low for Entresto or ARB.  Emmanuel Ercole [place], [place] 01/05/2016 8:45 AM

## 2016-01-07 ENCOUNTER — Inpatient Hospital Stay (HOSPITAL_COMMUNITY): Payer: Commercial Managed Care - HMO

## 2016-01-07 LAB — BASIC METABOLIC PANEL
ANION GAP: 6 (ref 5–15)
BUN: 21 mg/dL — ABNORMAL HIGH (ref 6–20)
CO2: 31 mmol/L (ref 22–32)
Calcium: 8.4 mg/dL — ABNORMAL LOW (ref 8.9–10.3)
Chloride: 100 mmol/L — ABNORMAL LOW (ref 101–111)
Creatinine, Ser: 0.77 mg/dL (ref 0.44–1.00)
GFR calc Af Amer: >60 mL/min (ref >60)
Glucose, Bld: 145 mg/dL — ABNORMAL HIGH (ref 65–99)
POTASSIUM: 4 mmol/L (ref 3.5–5.1)
SODIUM: 137 mmol/L (ref 135–145)

## 2016-01-07 LAB — CBC
HEMATOCRIT: 34.5 % — AB (ref 36.0–46.0)
HEMOGLOBIN: 10.9 g/dL — AB (ref 12.0–15.0)
MCH: 33.1 pg (ref 26.0–34.0)
MCHC: 31.6 g/dL (ref 30.0–36.0)
MCV: 104.9 fL — ABNORMAL HIGH (ref 78.0–100.0)
Platelets: 200 10*3/uL (ref 150–400)
RBC: 3.29 MIL/uL — AB (ref 3.87–5.11)
RDW: 17.8 % — ABNORMAL HIGH (ref 11.5–15.5)
WBC: 8.5 10*3/uL (ref 4.0–10.5)

## 2016-01-07 LAB — PROTIME-INR
INR: 1.67 — AB (ref 0.00–1.49)
INR: 1.7 — AB (ref 0.00–1.49)
PROTHROMBIN TIME: 19.7 s — AB (ref 11.6–15.2)
Prothrombin Time: 20 seconds — ABNORMAL HIGH (ref 11.6–15.2)

## 2016-01-07 LAB — GLUCOSE, CAPILLARY
GLUCOSE-CAPILLARY: 115 mg/dL — AB (ref 65–99)
GLUCOSE-CAPILLARY: 152 mg/dL — AB (ref 65–99)
GLUCOSE-CAPILLARY: 141 mg/dL — AB (ref 65–99)
GLUCOSE-CAPILLARY: 153 mg/dL — AB (ref 65–99)

## 2016-01-07 LAB — BRAIN NATRIURETIC PEPTIDE: B NATRIURETIC PEPTIDE 5: 734.1 pg/mL — AB (ref 0.0–100.0)

## 2016-01-07 LAB — HEPARIN LEVEL (UNFRACTIONATED)
HEPARIN UNFRACTIONATED: 0.47 [IU]/mL (ref 0.30–0.70)
HEPARIN UNFRACTIONATED: 0.49 [IU]/mL (ref 0.30–0.70)

## 2016-01-07 MED ORDER — FUROSEMIDE 10 MG/ML IJ SOLN
40.0000 mg | Freq: Once | INTRAMUSCULAR | Status: AC
  Administered 2016-01-07: 40 mg via INTRAVENOUS
  Filled 2016-01-07: qty 4

## 2016-01-07 MED ORDER — AMIODARONE HCL 200 MG PO TABS
200.0000 mg | ORAL_TABLET | Freq: Two times a day (BID) | ORAL | Status: DC
  Administered 2016-01-07 – 2016-01-09 (×4): 200 mg via ORAL
  Filled 2016-01-07 (×4): qty 1

## 2016-01-07 MED ORDER — WARFARIN SODIUM 2.5 MG PO TABS
1.2500 mg | ORAL_TABLET | Freq: Once | ORAL | Status: AC
  Administered 2016-01-07: 1.25 mg via ORAL
  Filled 2016-01-07: qty 0.5

## 2016-01-07 MED ORDER — FUROSEMIDE 10 MG/ML IJ SOLN
40.0000 mg | Freq: Every day | INTRAMUSCULAR | Status: DC
  Administered 2016-01-08: 40 mg via INTRAVENOUS
  Filled 2016-01-07: qty 4

## 2016-01-07 NOTE — Progress Notes (Signed)
ANTICOAGULATION CONSULT NOTE - Follow Up Consult  Pharmacy Consult for Heparin/warfarin Indication: atrial fibrillation  Allergies  Allergen Reactions  . Demerol Other (See Comments)    Burning sensation all over body  . Erythromycin Nausea And Vomiting  . Penicillins Other (See Comments)    unknown  . Percocet [Oxycodone-Acetaminophen] Swelling    Patient Measurements: Height: 5\' 2"  (157.5 cm) Weight: 155 lb (70.308 kg) IBW/kg (Calculated) : 50.1  Vital Signs: Temp: 97.8 F (36.6 C) (07/08 0403) Temp Source: Oral (07/08 0403) BP: 95/73 mmHg (07/08 0926) Pulse Rate: 94 (07/08 0926)  Labs:  Recent Labs  01/05/16 0330 01/05/16 0842  01/06/16 0258 01/06/16 2354 01/07/16 0314  HGB  --   --   --  11.2* 10.9*  --   HCT  --   --   --  35.5* 34.5*  --   PLT  --   --   --  236 200  --   LABPROT 22.5*  --   --  20.4* 20.0*  --   INR 1.99*  --   --  1.75* 1.70*  --   HEPARINUNFRC  --   --   < > 0.57 0.47 0.49  CREATININE  --  0.85  --  0.89 0.77  --   < > = values in this interval not displayed.  Estimated Creatinine Clearance: 49.8 mL/min (by C-G formula based on Cr of 0.77).   Assessment: 82yo F continuing on heparin and warfarin for afib. Warfarin PTA restarted post cath on 7/7. Heparin level remains therapeutic 0.49, INR 1.67. Hgb 10.9, PLt wnl, no bleed noted  PTA warfarin dose 1.25 mg daily  Goal of Therapy:  Heparin level 0.3-0.7 units/ml  INR 2-3 Monitor platelets by anticoagulation protocol: Yes   Plan:  -Cont heparin at 1000 units/hr -Warfarin 1.25 mg tonight -Daily HL, INR, CBC -Monitor s/sx of bleeding  Gwenlyn Perking, PharmD PGY1 Pharmacy Resident Pager: [phone]  01/07/2016,11:29 AM

## 2016-01-07 NOTE — Progress Notes (Signed)
Around 0030-0100, patient's color became grayish and more pale than normal. Patient just got up to the bedside commode and back to bed when she experienced chest pain in mid sternal area. Patient stated that, "she has been having that pain since she has been here and the doctors are trying to figure out what is going on. Patient's O2 saturation dropped to 89% on 3L. Called rapid to assess patient. Patient was moved up in the bed and her sats went up to 92-94% on 3L. Patient's heart sounds sounded like a gallop. Rapid response nurses heard crackles in the lower bases. Notified Dr. Susy Manor on call. Order for 40mg  IV lasixs push was ordered and completed. Patient on strict I and Os and daily weight. Dr. Susy Manor that if her sats dropped again and patient was having trouble breathing then she might have to go on bipap. Will continue to monitor.

## 2016-01-07 NOTE — Progress Notes (Addendum)
SUBJECTIVE:  Denies CP but has increased SOB this am and appears dyspneic lying in bed talking  OBJECTIVE:   Vitals:   Filed Vitals:   01/06/16 2016 01/07/16 0026 01/07/16 0403 01/07/16 0926  BP: 105/79 105/82 104/78 95/73  Pulse: 102 103 101 94  Temp: 97.8 F (36.6 C) 97.5 F (36.4 C) 97.8 F (36.6 C)   TempSrc: Oral Oral Oral   Resp: 18 18 20    Height:      Weight:   155 lb (70.308 kg)   SpO2: 93% 92% 93%    I&O's:   Intake/Output Summary (Last 24 hours) at 01/07/16 0956 Last data filed at 01/07/16 0840  Gross per 24 hour  Intake    840 ml  Output   1692 ml  Net   -852 ml   TELEMETRY: Reviewed telemetry pt in atrial fibrillation with RVR     PHYSICAL EXAM General: Well developed, well nourished, in no acute distress Head: Eyes PERRLA, No xanthomas.   Normal cephalic and atramatic  Lungs:  Decreased air movement throughout and crackles at the bases Heart:   Irregularly irregular S1 S2 Pulses are 2+ & equal. Abdomen: Bowel sounds are positive, abdomen soft and non-tender without masses  Msk:  Back normal, normal gait. Normal strength and tone for age. Extremities:   No clubbing, cyanosis or edema.  DP +1 Neuro: Alert and oriented X 3. Psych:  Good affect, responds appropriately   LABS: Basic Metabolic Panel:  Recent Labs  01/06/16 0258 01/06/16 2354  NA 137 137  K 4.0 4.0  CL 99* 100*  CO2 32 31  GLUCOSE 158* 145*  BUN 22* 21*  CREATININE 0.89 0.77  CALCIUM 8.4* 8.4*   Liver Function Tests: No results for input(s): AST, ALT, ALKPHOS, BILITOT, PROT, ALBUMIN in the last 72 hours. No results for input(s): LIPASE, AMYLASE in the last 72 hours. CBC:  Recent Labs  01/06/16 0258 01/06/16 2354  WBC 8.8 8.5  HGB 11.2* 10.9*  HCT 35.5* 34.5*  MCV 106.0* 104.9*  PLT 236 200   Cardiac Enzymes: No results for input(s): CKTOTAL, CKMB, CKMBINDEX, TROPONINI in the last 72 hours. BNP: Invalid input(s): POCBNP D-Dimer: No results for input(s): DDIMER  in the last 72 hours. Hemoglobin A1C: No results for input(s): HGBA1C in the last 72 hours. Fasting Lipid Panel: No results for input(s): CHOL, HDL, LDLCALC, TRIG, CHOLHDL, LDLDIRECT in the last 72 hours. Thyroid Function Tests: No results for input(s): TSH, T4TOTAL, T3FREE, THYROIDAB in the last 72 hours.  Invalid input(s): FREET3 Anemia Panel: No results for input(s): VITAMINB12, FOLATE, FERRITIN, TIBC, IRON, RETICCTPCT in the last 72 hours. Coag Panel:   Lab Results  Component Value Date   INR 1.70* 01/06/2016   INR 1.75* 01/06/2016   INR 1.99* 01/05/2016    RADIOLOGY: Ct Abdomen Pelvis Wo Contrast  12/31/2015  CLINICAL DATA:  Abdominal pain and nausea for 3 days EXAM: CT ABDOMEN AND PELVIS WITHOUT CONTRAST TECHNIQUE: Multidetector CT imaging of the abdomen and pelvis was performed following the standard protocol without IV contrast. COMPARISON:  None. FINDINGS: There is small to moderate volume ascites. There are unremarkable unenhanced appearances of the liver, spleen, pancreas and adrenals. There are several calculi in the gallbladder lumen measuring up to 4 mm. Kidneys are unremarkable. Urinary bladder is unremarkable. Uterus and adnexal structures are unremarkable. Bowel is unremarkable. Appendix is normal. Enteric contrast has reached the rectum. There is a large right effusion and a moderate left effusion. There is  mild patchy and linear opacity in both lung bases which may be atelectatic but infectious infiltrates cannot be excluded. No significant skeletal lesion is evident. The abdominal aorta is normal in caliber with moderate atherosclerotic calcification. Extensive coronary artery calcified plaque. IMPRESSION: 1. Small to moderate volume ascites. 2. Cholelithiasis. 3. Negative for bowel obstruction, perforation or focal inflammatory change. 4. Bilateral pleura effusions, right greater than left. Lung base opacities may be atelectatic, but infectious infiltrates cannot be excluded.  Electronically Signed   By: Andreas Newport M.D.   On: 12/31/2015 06:28   Dg Chest 2 View  01/05/2016  CLINICAL DATA:  Preoperative evaluation prior to cardiac catheterization which is scheduled for tomorrow. Shortness of breath and generalized weakness. EXAM: CHEST  2 VIEW COMPARISON:  12/31/2015, 11/16/2015 and earlier. FINDINGS: Cardiac silhouette mildly to moderately enlarged, unchanged. Thoracic aorta tortuous and atherosclerotic, unchanged. Hilar and mediastinal contours otherwise unremarkable. Mild interstitial and perihilar airspace pulmonary edema, slightly worsened since the examination 5 days ago. Moderate-sized bilateral pleural effusions, unchanged, with associated dense consolidation in the lower lobes. Abdominal aortic atherosclerosis without aneurysm. Osseous demineralization, degenerative changes in both shoulders, thoracic dextroscoliosis with compensatory thoracolumbar levoscoliosis. IMPRESSION: 1. Mild CHF with stable mild to moderate cardiomegaly and mild interstitial and perihilar airspace pulmonary edema. This has worsened slightly since the examination 5 days ago. 2. Stable moderate-sized bilateral pleural effusions and associated dense passive atelectasis in the lower lobes. 3. Thoracic and abdominal aortic atherosclerosis. Electronically Signed   By: Evangeline Dakin M.D.   On: 01/05/2016 14:18   Dg Chest 2 View  12/31/2015  CLINICAL DATA:  Dyspnea, cough and weakness for 3 months. EXAM: CHEST  2 VIEW COMPARISON:  11/16/2015 FINDINGS: There is unchanged moderate cardiomegaly and aortic tortuosity. There is focal airspace consolidation in the left base, as well as curvilinear atelectatic appearing opacities in the lateral lungs. Probable small effusions bilaterally. IMPRESSION: Airspace opacities and effusions bilaterally. This may represent pneumonia. Electronically Signed   By: Andreas Newport M.D.   On: 12/31/2015 00:51   Dg Chest Port 1 View  01/06/2016  CLINICAL DATA:  Acute  onset of shortness of breath and cough. Preoperative chest radiograph. Initial encounter. EXAM: PORTABLE CHEST 1 VIEW COMPARISON:  Chest radiograph performed 01/05/2016 FINDINGS: Small bilateral pleural effusions are noted. Mildly increased bilateral airspace opacification may reflect pulmonary edema or pneumonia. No pneumothorax is seen. The cardiomediastinal silhouette is mildly enlarged. No acute osseous abnormalities are identified. IMPRESSION: Small bilateral pleural effusions noted. Mildly increased bilateral airspace opacification may reflect pulmonary edema or pneumonia. Mild cardiomegaly. Electronically Signed   By: Garald Balding M.D.   On: 01/06/2016 06:48    Assessment & Plan   1. Acute on chronic systolic CHF  - presented with chest discomfort and worsening dyspnea. Echo this admission shows a newly decreased EF of 20-25% with diffuse HK as compared to her prior EF of 50-55% in 04/2015. EF 25% by cath.  - unable to tolerate Entresto due to low BP. On metoprolol 50 mg bid now. On oral lasix 40 mg daily. Low dose ARB started yesterday and BP stable. LV filling pressures and right heart pressures were normal at cath.  - Net -8.7L since admission. Weight down 11 lbs. Dry weight less than 150 lbs, currently at 155 lbs today. She is clearly more SOB today and has crackles at bases.  Will give Lasix 40mg  IV now.  Check BNP and chest xray.  - Afib rate is not adequately controlled and may be  contributing to her CHF.  Will increase Amio to 200mg  BID for better rate control.    2. CAD  - s/p stenting of OM in 2007. Cardiac cath shows nonobstructive disease. ASA stopped since she is on coumadin.  - continue statin, and BB. Now on Heparin  per pharmacy and coumadin resumed post cath. When INR >2 can stop heparin.  3. Diabetes  - with ulceration of toe. Per IM.  - Metformin held.  4. Hyperlipidemia - continue statin therapy.  5. Persistent Atrial Fibrillation - since July 2016. On  amiodarone and metoprolol for rate control (Toprol-XL increased from 25 mg bid to 50 mg bid). Rate still elevated and soft BP precludes increasing BB.  Will increase Amio to 200mg  BID. - This patients CHA2DS2-VASc Score and unadjusted Ischemic Stroke Rate (% per year) is equal to 12.2 % stroke rate/year from a score of 9 (CHF, HTN, DM, Vascular, Female, Age (2), TIA(2)). Coumadin to be resumed post cath.  6. Hypokalemia - currently resolved. Continue to monitor.         Fransico Him, MD  01/07/2016  9:56 AM

## 2016-01-07 NOTE — Progress Notes (Signed)
ANTICOAGULATION CONSULT NOTE - Follow Up Consult  Pharmacy Consult for Heparin  Indication: atrial fibrillation  Allergies  Allergen Reactions  . Demerol Other (See Comments)    Burning sensation all over body  . Erythromycin Nausea And Vomiting  . Penicillins Other (See Comments)    unknown  . Percocet [Oxycodone-Acetaminophen] Swelling    Patient Measurements: Height: 5\' 2"  (157.5 cm) Weight: 148 lb 1.6 oz (67.178 kg) IBW/kg (Calculated) : 50.1  Vital Signs: Temp: 97.8 F (36.6 C) (07/07 2016) Temp Source: Oral (07/08 0026) BP: 105/82 mmHg (07/08 0026) Pulse Rate: 103 (07/08 0026)  Labs:  Recent Labs  01/04/16 0325 01/05/16 0330 01/05/16 0842 01/05/16 1538 01/06/16 0258 01/06/16 2354  HGB 12.5  --   --   --  11.2* 10.9*  HCT 40.0  --   --   --  35.5* 34.5*  PLT 227  --   --   --  236 200  LABPROT 25.8* 22.5*  --   --  20.4* 20.0*  INR 2.38* 1.99*  --   --  1.75* 1.70*  HEPARINUNFRC  --   --   --  0.34 0.57 0.47  CREATININE 0.86  --  0.85  --  0.89  --     Estimated Creatinine Clearance: 43.8 mL/min (by C-G formula based on Cr of 0.89).   Assessment: Therapeutic heparin level x 1 after re-start s/p cath  Goal of Therapy:  Heparin level 0.3-0.7 units/ml Monitor platelets by anticoagulation protocol: Yes   Plan:  -Cont heparin at 1000 units/hr -Confirmatory HL with AM labs  Narda Bonds 01/07/2016,12:37 AM

## 2016-01-07 NOTE — Progress Notes (Signed)
Progress Note    Shannon [name]  [id number] DOB: [birth year]  DOA: 12/30/2015 PCP: Haywood Pao, MD    Brief Narrative:   Shannon [name] is an 82 y.o. female with CAD status post stenting, CHF, paroxysmal atrial fibrillation, diabetes mellitus type 2, hyperlipidemia was brought to the ER after patient was complaining of abdominal pain and shortness of breath.. On exam patient also has bilateral lower extremity edema and wound of the left great toe. Chest x-ray shows features concerning for pneumonia and BNP is elevated. Patient at this time will be admitted for acute respiratory failure probably from CHF and pneumonia. 7/7 pt underwent cardiac cath, shows stable CAD, maximize medical therapy.  7/8 worsening sob, earlier this am , repeat CXR shows mild increase in interstitial edema, she received 2 doses of IV lasix today.    Assessment/Plan:   Principal Problem:   Acute respiratory failure with hypoxia (HCC) Active Problems:   Chronic atrial fibrillation (HCC)   Abdominal pain   CAD (coronary artery disease)   Cardiomyopathy (HCC)   Chronic systolic CHF (congestive heart failure) (HCC)   Elevated troponin   Acute on chronic systolic CHF (congestive heart failure) (HCC)   Leg swelling   SOB (shortness of breath)   Acute respiratory failure with hypoxia/Acute systolic CHF -Likely multi-factorial including heart failure and possible pneumonia -Upon admission to the ED, SPO2 87%, continue oxygen supplementation,plan to wean her off the oxygen.  - started on IV lasix and changed to po lasix on 7/6, plan to change to IV lasix on 7/8.  -Chest x-ray: Airspace opacities with effusions bilaterally. Repeat CXR shows slight increase int he interstitial edema.  -Echocardiogram 04/07/2015 showed an EF of 50-55% - repeat Echocardiogram 01/01/2016: EF 20-25%, diffuse hypokinesis, Severe TR with severely elevated pulmonary pressure -Monitor intake and output, daily weights.  Negative balance of 8.7 lit since admission, weight down by 18 lbs.  -completed the course of antibiotics by 7/7.  -Although ddimer elevated, unlikely PE, as patient's INR has been supratherapeutic on admission. -LE doppler negative for DVT or SVT -Cardiology consulted and recommendations appreciated. - underwent cardiac cath on 7/7 showed widely patent stents. Minimal CAD in LAD. Mild to mod pulm hypertension.  - added on tessalon and changed to IV lasix.  - monitor renal parameters on IV lasix.   Abdominal pain -Improved  -CT abdomen and pelvis: small to moderate volume ascites, cholelithiasis, negative for bowel obstruction, perforation or ambulatory change. Bilateral pleural effusions, right greater than left. -Continue to monitor, pain control  Coronary artery disease -Denies chest pain -Troponin mildly elevated but trending downward (0.1, 0.07, 0.06, 0.05) -Continue aspirin, statin, metoprolol.  - cardiac cath shows minimal CAD in LAD, patent stents.    Atrial fibrillation, chronic -CHADSVASC 4 (age, gender, DM) -Coumadin held, INR therapeutic. Rate slightly elevated, metoprolol increased by cardiology. Heparin started as INR is 1.9, resume coumadin, monitor INR.  - subtherapeutic INR.   Left great toe diabetic wound: Wound care consulted and resume local wound care.  No surrounding cellulitis.   Diabetes mellitus, type II -Metformin held, continued findings for CBG monitoring CBG (last 3)   Recent Labs  01/07/16 0640 01/07/16 1321 01/07/16 1543  GLUCAP 115* 152* 141*    Resume SSI.   Hyperlipidemia -Continue statin  Anemia of chronic disease.  -Continue iron supplementation -Baseline hemoglobin approximately 10, currently around 12.   Hypokalemia/hypomagnesemia -Secondary to diuretics - repleted and repeat in am shows normal values.  Family Communication/Anticipated D/C date and plan/Code Status   DVT prophylaxis: IV heparin and coumadin.  Code  Status: Full Code.  Family Communication: none at bedside.  Disposition Plan: pending further investigation    Medical Consultants:    cardiology   Procedures:   Cardiac cath scheduled for 7/7 Anti-Infectives:   Anti-infectives    Start     Dose/Rate Route Frequency Ordered Stop   01/02/16 1300  cefTRIAXone (ROCEPHIN) 2 g in dextrose 5 % 50 mL IVPB  Status:  Discontinued     2 g 100 mL/hr over 30 Minutes Intravenous Every 24 hours 01/02/16 1156 01/06/16 1808   01/01/16 0500  vancomycin (VANCOCIN) IVPB 1000 mg/200 mL premix  Status:  Discontinued     1,000 mg 200 mL/hr over 60 Minutes Intravenous Every 24 hours 12/31/15 0440 01/02/16 1156   12/31/15 1400  aztreonam (AZACTAM) 1 g in dextrose 5 % 50 mL IVPB  Status:  Discontinued     1 g 100 mL/hr over 30 Minutes Intravenous Every 8 hours 12/31/15 0440 01/02/16 1156   12/31/15 0600  aztreonam (AZACTAM) 2 g in dextrose 5 % 50 mL IVPB  Status:  Discontinued     2 g 100 mL/hr over 30 Minutes Intravenous Every 8 hours 12/31/15 0428 12/31/15 0435   12/31/15 0130  aztreonam (AZACTAM) 1 g in dextrose 5 % 50 mL IVPB     1 g 100 mL/hr over 30 Minutes Intravenous  Once 12/31/15 0121 12/31/15 0338   12/31/15 0130  vancomycin (VANCOCIN) IVPB 1000 mg/200 mL premix     1,000 mg 200 mL/hr over 60 Minutes Intravenous NOW 12/31/15 0122 12/31/15 0247      Subjective:    Shannon [name] denies any new pain, But reports worsening cough and sob.   Objective:    Filed Vitals:   01/07/16 0026 01/07/16 0403 01/07/16 0926 01/07/16 1312  BP: 105/82 104/78 95/73 100/76  Pulse: 103 101 94 102  Temp: 97.5 F (36.4 C) 97.8 F (36.6 C)  98 F (36.7 C)  TempSrc: Oral Oral  Oral  Resp: 18 20  18   Height:      Weight:  70.308 kg (155 lb)    SpO2: 92% 93%  95%    Intake/Output Summary (Last 24 hours) at 01/07/16 1722 Last data filed at 01/07/16 1514  Gross per 24 hour  Intake    710 ml  Output   1142 ml  Net   -432 ml   Filed  Weights   01/05/16 0349 01/06/16 0528 01/07/16 0403  Weight: 66.679 kg (147 lb) 67.178 kg (148 lb 1.6 oz) 70.308 kg (155 lb)    Exam: General exam: Appears calm and comfortable. Not in distress on 2 lit of [place] oxygen.  Respiratory system: Clear to auscultation. Respiratory effort normal. Cardiovascular system: S1 & S2 heard, RRR. No JVD,  rubs, gallops or clicks. No murmurs. Gastrointestinal system: Abdomen is nondistended, soft and nontender. No organomegaly or masses felt. Normal bowel sounds heard. Central nervous system: Alert and oriented. No  New focal neurological deficits. Extremities: bilateral 2 + edema. Left great toe wound.  Psychiatry: Judgement and insight appear normal. Mood & affect appropriate.   Data Reviewed:   I have personally reviewed following labs and imaging studies:  Labs: Basic Metabolic Panel:  Recent Labs Lab 01/02/16 0306 01/03/16 0302 01/04/16 0325 01/05/16 0842 01/06/16 0258 01/06/16 2354  NA 136 137 139 138 137 137  K 3.3* 3.4* 4.2 4.1 4.0 4.0  CL 102 102 101 100* 99* 100*  CO2 27 29 31 30  32 31  GLUCOSE 123* 141* 169* 167* 158* 145*  BUN 18 16 17 17  22* 21*  CREATININE 0.93 0.81 0.86 0.85 0.89 0.77  CALCIUM 8.1* 7.9* 8.1* 8.5* 8.4* 8.4*  MG 1.3*  --  1.7  --   --   --    GFR Estimated Creatinine Clearance: 49.8 mL/min (by C-G formula based on Cr of 0.77). Liver Function Tests: No results for input(s): AST, ALT, ALKPHOS, BILITOT, PROT, ALBUMIN in the last 168 hours. No results for input(s): LIPASE, AMYLASE in the last 168 hours. No results for input(s): AMMONIA in the last 168 hours. Coagulation profile  Recent Labs Lab 01/04/16 0325 01/05/16 0330 01/06/16 0258 01/06/16 2354 01/07/16 1238  INR 2.38* 1.99* 1.75* 1.70* 1.67*    CBC:  Recent Labs Lab 01/02/16 0306 01/03/16 0302 01/04/16 0325 01/06/16 0258 01/06/16 2354  WBC 6.7 9.2 10.6* 8.8 8.5  HGB 10.9* 12.1 12.5 11.2* 10.9*  HCT 35.1* 36.9 40.0 35.5* 34.5*  MCV  103.8* 103.1* 104.7* 106.0* 104.9*  PLT 216 257 227 236 200   Cardiac Enzymes: No results for input(s): CKTOTAL, CKMB, CKMBINDEX, TROPONINI in the last 168 hours. BNP (last 3 results) No results for input(s): PROBNP in the last 8760 hours. CBG:  Recent Labs Lab 01/06/16 1640 01/06/16 2157 01/07/16 0640 01/07/16 1321 01/07/16 1543  GLUCAP 133* 147* 115* 152* 141*   D-Dimer: No results for input(s): DDIMER in the last 72 hours. Hgb A1c: No results for input(s): HGBA1C in the last 72 hours. Lipid Profile: No results for input(s): CHOL, HDL, LDLCALC, TRIG, CHOLHDL, LDLDIRECT in the last 72 hours. Thyroid function studies: No results for input(s): TSH, T4TOTAL, T3FREE, THYROIDAB in the last 72 hours.  Invalid input(s): FREET3 Anemia work up: No results for input(s): VITAMINB12, FOLATE, FERRITIN, TIBC, IRON, RETICCTPCT in the last 72 hours. Sepsis Labs:  Recent Labs Lab 01/03/16 0302 01/04/16 0325 01/06/16 0258 01/06/16 2354  WBC 9.2 10.6* 8.8 8.5   Urine analysis:    Component Value Date/Time   COLORURINE YELLOW 12/30/2015 2320   APPEARANCEUR CLOUDY* 12/30/2015 2320   LABSPEC 1.020 12/30/2015 2320   PHURINE 5.0 12/30/2015 2320   GLUCOSEU NEGATIVE 12/30/2015 2320   HGBUR NEGATIVE 12/30/2015 2320   BILIRUBINUR NEGATIVE 12/30/2015 2320   KETONESUR NEGATIVE 12/30/2015 2320   PROTEINUR 30* 12/30/2015 2320   UROBILINOGEN 0.2 10/09/2012 1551   NITRITE NEGATIVE 12/30/2015 2320   LEUKOCYTESUR SMALL* 12/30/2015 2320   Microbiology No results found for this or any previous visit (from the past 240 hour(s)).  Radiology: Dg Chest 2 View  01/07/2016  CLINICAL DATA:  Short of breath, congestive heart failure. EXAM: CHEST  2 VIEW COMPARISON:  01/06/2016 FINDINGS: Mildly enlarged cardiac silhouette. Interval increase in perihilar airspace disease. This increases only mild. Bilateral pleural effusions unchanged. IMPRESSION: No significant change in congestive heart failure  pattern. Mild increase in pulmonary edema. Electronically Signed   By: Suzy Bouchard M.D.   On: 01/07/2016 12:28   Dg Chest Port 1 View  01/06/2016  CLINICAL DATA:  Acute onset of shortness of breath and cough. Preoperative chest radiograph. Initial encounter. EXAM: PORTABLE CHEST 1 VIEW COMPARISON:  Chest radiograph performed 01/05/2016 FINDINGS: Small bilateral pleural effusions are noted. Mildly increased bilateral airspace opacification may reflect pulmonary edema or pneumonia. No pneumothorax is seen. The cardiomediastinal silhouette is mildly enlarged. No acute osseous abnormalities are identified. IMPRESSION: Small bilateral pleural effusions noted. Mildly increased  bilateral airspace opacification may reflect pulmonary edema or pneumonia. Mild cardiomegaly. Electronically Signed   By: Garald Balding M.D.   On: 01/06/2016 06:48    Medications:   . amiodarone  200 mg Oral BID  . atorvastatin  20 mg Oral Daily  . ferrous sulfate  325 mg Oral Q breakfast  . furosemide  40 mg Oral Daily  . insulin aspart  0-9 Units Subcutaneous TID WC  . LORazepam  0.5 mg Intravenous Once  . losartan  25 mg Oral Daily  . metoprolol succinate  50 mg Oral BID  . potassium chloride  10 mEq Oral Daily  . saccharomyces boulardii  250 mg Oral BID  . sodium chloride flush  3 mL Intravenous Q12H  . sodium chloride flush  3 mL Intravenous Q12H  . warfarin  1.25 mg Oral ONCE-1800  . Warfarin - Pharmacist Dosing Inpatient   Does not apply q1800   Continuous Infusions: . heparin 1,000 Units/hr (01/07/16 1318)    Time spent: 25 minutes.    LOS: 7 days   [facility]  [facility] Pager [phone]  *Please refer to [email or web address], password [email or web address] to get updated schedule on who will round on this patient, as hospitalists switch teams weekly. If 7PM-7AM, please contact night-coverage at [email or web address], password [email or web address] for any overnight needs.  01/07/2016, 5:22 PM

## 2016-01-08 ENCOUNTER — Inpatient Hospital Stay (HOSPITAL_COMMUNITY): Payer: Commercial Managed Care - HMO

## 2016-01-08 DIAGNOSIS — R0602 Shortness of breath: Secondary | ICD-10-CM

## 2016-01-08 LAB — BASIC METABOLIC PANEL
Anion gap: 8 (ref 5–15)
BUN: 21 mg/dL — AB (ref 6–20)
CALCIUM: 8.5 mg/dL — AB (ref 8.9–10.3)
CO2: 28 mmol/L (ref 22–32)
CREATININE: 0.76 mg/dL (ref 0.44–1.00)
Chloride: 99 mmol/L — ABNORMAL LOW (ref 101–111)
GFR calc Af Amer: >60 mL/min (ref >60)
GLUCOSE: 121 mg/dL — AB (ref 65–99)
Potassium: 3.5 mmol/L (ref 3.5–5.1)
Sodium: 135 mmol/L (ref 135–145)

## 2016-01-08 LAB — CBC
HCT: 36.1 % (ref 36.0–46.0)
Hemoglobin: 10.9 g/dL — ABNORMAL LOW (ref 12.0–15.0)
MCH: 32 pg (ref 26.0–34.0)
MCHC: 30.2 g/dL (ref 30.0–36.0)
MCV: 105.9 fL — ABNORMAL HIGH (ref 78.0–100.0)
PLATELETS: 185 10*3/uL (ref 150–400)
RBC: 3.41 MIL/uL — AB (ref 3.87–5.11)
RDW: 17.6 % — ABNORMAL HIGH (ref 11.5–15.5)
WBC: 8.9 10*3/uL (ref 4.0–10.5)

## 2016-01-08 LAB — GLUCOSE, CAPILLARY
GLUCOSE-CAPILLARY: 124 mg/dL — AB (ref 65–99)
GLUCOSE-CAPILLARY: 134 mg/dL — AB (ref 65–99)
Glucose-Capillary: 96 mg/dL (ref 65–99)
Glucose-Capillary: 146 mg/dL — ABNORMAL HIGH (ref 65–99)

## 2016-01-08 LAB — POCT I-STAT 3, ART BLOOD GAS (G3+)
Acid-Base Excess: 7 mmol/L — ABNORMAL HIGH (ref 0.0–2.0)
BICARBONATE: 32.3 meq/L — AB (ref 20.0–24.0)
O2 Saturation: 90 %
PCO2 ART: 45.6 mmHg — AB (ref 35.0–45.0)
PH ART: 7.456 — AB (ref 7.350–7.450)
PO2 ART: 55 mmHg — AB (ref 80.0–100.0)
Patient temperature: 97.6
TCO2: 34 mmol/L (ref 0–100)

## 2016-01-08 LAB — PROTIME-INR
INR: 1.66 — ABNORMAL HIGH (ref 0.00–1.49)
Prothrombin Time: 19.6 seconds — ABNORMAL HIGH (ref 11.6–15.2)

## 2016-01-08 LAB — HEPARIN LEVEL (UNFRACTIONATED): HEPARIN UNFRACTIONATED: 0.42 [IU]/mL (ref 0.30–0.70)

## 2016-01-08 MED ORDER — FUROSEMIDE 10 MG/ML IJ SOLN
INTRAMUSCULAR | Status: AC
  Filled 2016-01-08: qty 2

## 2016-01-08 MED ORDER — ALPRAZOLAM 0.25 MG PO TABS
0.2500 mg | ORAL_TABLET | Freq: Every day | ORAL | Status: DC | PRN
  Administered 2016-01-08 – 2016-01-12 (×2): 0.25 mg via ORAL
  Filled 2016-01-08 (×2): qty 1

## 2016-01-08 MED ORDER — VANCOMYCIN HCL 500 MG IV SOLR
500.0000 mg | Freq: Two times a day (BID) | INTRAVENOUS | Status: DC
  Administered 2016-01-09 – 2016-01-10 (×3): 500 mg via INTRAVENOUS
  Filled 2016-01-08 (×4): qty 500

## 2016-01-08 MED ORDER — DEXTROSE 5 % IV SOLN
2.0000 g | INTRAVENOUS | Status: DC
  Administered 2016-01-08 – 2016-01-09 (×2): 2 g via INTRAVENOUS
  Filled 2016-01-08 (×4): qty 2

## 2016-01-08 MED ORDER — POTASSIUM CHLORIDE CRYS ER 20 MEQ PO TBCR
20.0000 meq | EXTENDED_RELEASE_TABLET | Freq: Once | ORAL | Status: AC
  Administered 2016-01-08: 20 meq via ORAL
  Filled 2016-01-08: qty 1

## 2016-01-08 MED ORDER — WARFARIN SODIUM 2 MG PO TABS
2.0000 mg | ORAL_TABLET | Freq: Once | ORAL | Status: AC
  Administered 2016-01-08: 2 mg via ORAL
  Filled 2016-01-08: qty 1

## 2016-01-08 MED ORDER — FUROSEMIDE 10 MG/ML IJ SOLN
INTRAMUSCULAR | Status: AC
  Filled 2016-01-08: qty 4

## 2016-01-08 MED ORDER — FUROSEMIDE 10 MG/ML IJ SOLN
60.0000 mg | Freq: Once | INTRAMUSCULAR | Status: AC
  Administered 2016-01-08: 60 mg via INTRAVENOUS

## 2016-01-08 MED ORDER — SODIUM CHLORIDE 0.9 % IV SOLN
1500.0000 mg | Freq: Once | INTRAVENOUS | Status: AC
  Administered 2016-01-08: 1500 mg via INTRAVENOUS
  Filled 2016-01-08: qty 1500

## 2016-01-08 MED ORDER — FUROSEMIDE 10 MG/ML IJ SOLN
40.0000 mg | Freq: Two times a day (BID) | INTRAMUSCULAR | Status: DC
  Administered 2016-01-09: 40 mg via INTRAVENOUS
  Filled 2016-01-08: qty 4

## 2016-01-08 NOTE — Progress Notes (Addendum)
SUBJECTIVE:  Still SOB  OBJECTIVE:   Vitals:   Filed Vitals:   01/07/16 2121 01/08/16 0012 01/08/16 0519 01/08/16 0528  BP: 105/74 113/78 113/72   Pulse: 103 95 103   Temp: 97.6 F (36.4 C)  97.7 F (36.5 C)   TempSrc: Oral  Oral   Resp: 20 24 21    Height:      Weight:    149 lb 11.1 oz (67.9 kg)  SpO2: 98% 95% 95%    I&O's:   Intake/Output Summary (Last 24 hours) at 01/08/16 1112 Last data filed at 01/08/16 0959  Gross per 24 hour  Intake    220 ml  Output    551 ml  Net   -331 ml   TELEMETRY: Reviewed telemetry pt in atrial fibrillation :     PHYSICAL EXAM General: Well developed, well nourished, in no acute distress Head: Eyes PERRLA, No xanthomas.   Normal cephalic and atramatic  Lungs:   Few crackles at bases Heart:   Irregularly irregular S1 S2 Pulses are 2+ & equal. Abdomen: Bowel sounds are positive, abdomen soft and non-tender without masses  Msk:  Back normal, normal gait. Normal strength and tone for age. Extremities:   No clubbing, cyanosis or edema.  DP +1 Neuro: Alert and oriented X 3. Psych:  Good affect, responds appropriately   LABS: Basic Metabolic Panel:  Recent Labs  01/06/16 2354 01/08/16 0338  NA 137 135  K 4.0 3.5  CL 100* 99*  CO2 31 28  GLUCOSE 145* 121*  BUN 21* 21*  CREATININE 0.77 0.76  CALCIUM 8.4* 8.5*   Liver Function Tests: No results for input(s): AST, ALT, ALKPHOS, BILITOT, PROT, ALBUMIN in the last 72 hours. No results for input(s): LIPASE, AMYLASE in the last 72 hours. CBC:  Recent Labs  01/06/16 2354 01/08/16 0338  WBC 8.5 8.9  HGB 10.9* 10.9*  HCT 34.5* 36.1  MCV 104.9* 105.9*  PLT 200 185   Cardiac Enzymes: No results for input(s): CKTOTAL, CKMB, CKMBINDEX, TROPONINI in the last 72 hours. BNP: Invalid input(s): POCBNP D-Dimer: No results for input(s): DDIMER in the last 72 hours. Hemoglobin A1C: No results for input(s): HGBA1C in the last 72 hours. Fasting Lipid Panel: No results for input(s):  CHOL, HDL, LDLCALC, TRIG, CHOLHDL, LDLDIRECT in the last 72 hours. Thyroid Function Tests: No results for input(s): TSH, T4TOTAL, T3FREE, THYROIDAB in the last 72 hours.  Invalid input(s): FREET3 Anemia Panel: No results for input(s): VITAMINB12, FOLATE, FERRITIN, TIBC, IRON, RETICCTPCT in the last 72 hours. Coag Panel:   Lab Results  Component Value Date   INR 1.66* 01/08/2016   INR 1.67* 01/07/2016   INR 1.70* 01/06/2016    RADIOLOGY: Ct Abdomen Pelvis Wo Contrast  12/31/2015  CLINICAL DATA:  Abdominal pain and nausea for 3 days EXAM: CT ABDOMEN AND PELVIS WITHOUT CONTRAST TECHNIQUE: Multidetector CT imaging of the abdomen and pelvis was performed following the standard protocol without IV contrast. COMPARISON:  None. FINDINGS: There is small to moderate volume ascites. There are unremarkable unenhanced appearances of the liver, spleen, pancreas and adrenals. There are several calculi in the gallbladder lumen measuring up to 4 mm. Kidneys are unremarkable. Urinary bladder is unremarkable. Uterus and adnexal structures are unremarkable. Bowel is unremarkable. Appendix is normal. Enteric contrast has reached the rectum. There is a large right effusion and a moderate left effusion. There is mild patchy and linear opacity in both lung bases which may be atelectatic but infectious infiltrates cannot be  excluded. No significant skeletal lesion is evident. The abdominal aorta is normal in caliber with moderate atherosclerotic calcification. Extensive coronary artery calcified plaque. IMPRESSION: 1. Small to moderate volume ascites. 2. Cholelithiasis. 3. Negative for bowel obstruction, perforation or focal inflammatory change. 4. Bilateral pleura effusions, right greater than left. Lung base opacities may be atelectatic, but infectious infiltrates cannot be excluded. Electronically Signed   By: Andreas Newport M.D.   On: 12/31/2015 06:28   Dg Chest 2 View  01/07/2016  CLINICAL DATA:  Short of breath,  congestive heart failure. EXAM: CHEST  2 VIEW COMPARISON:  01/06/2016 FINDINGS: Mildly enlarged cardiac silhouette. Interval increase in perihilar airspace disease. This increases only mild. Bilateral pleural effusions unchanged. IMPRESSION: No significant change in congestive heart failure pattern. Mild increase in pulmonary edema. Electronically Signed   By: Suzy Bouchard M.D.   On: 01/07/2016 12:28   Dg Chest 2 View  01/05/2016  CLINICAL DATA:  Preoperative evaluation prior to cardiac catheterization which is scheduled for tomorrow. Shortness of breath and generalized weakness. EXAM: CHEST  2 VIEW COMPARISON:  12/31/2015, 11/16/2015 and earlier. FINDINGS: Cardiac silhouette mildly to moderately enlarged, unchanged. Thoracic aorta tortuous and atherosclerotic, unchanged. Hilar and mediastinal contours otherwise unremarkable. Mild interstitial and perihilar airspace pulmonary edema, slightly worsened since the examination 5 days ago. Moderate-sized bilateral pleural effusions, unchanged, with associated dense consolidation in the lower lobes. Abdominal aortic atherosclerosis without aneurysm. Osseous demineralization, degenerative changes in both shoulders, thoracic dextroscoliosis with compensatory thoracolumbar levoscoliosis. IMPRESSION: 1. Mild CHF with stable mild to moderate cardiomegaly and mild interstitial and perihilar airspace pulmonary edema. This has worsened slightly since the examination 5 days ago. 2. Stable moderate-sized bilateral pleural effusions and associated dense passive atelectasis in the lower lobes. 3. Thoracic and abdominal aortic atherosclerosis. Electronically Signed   By: Evangeline Dakin M.D.   On: 01/05/2016 14:18   Dg Chest 2 View  12/31/2015  CLINICAL DATA:  Dyspnea, cough and weakness for 3 months. EXAM: CHEST  2 VIEW COMPARISON:  11/16/2015 FINDINGS: There is unchanged moderate cardiomegaly and aortic tortuosity. There is focal airspace consolidation in the left base, as  well as curvilinear atelectatic appearing opacities in the lateral lungs. Probable small effusions bilaterally. IMPRESSION: Airspace opacities and effusions bilaterally. This may represent pneumonia. Electronically Signed   By: Andreas Newport M.D.   On: 12/31/2015 00:51   Dg Chest Port 1 View  01/06/2016  CLINICAL DATA:  Acute onset of shortness of breath and cough. Preoperative chest radiograph. Initial encounter. EXAM: PORTABLE CHEST 1 VIEW COMPARISON:  Chest radiograph performed 01/05/2016 FINDINGS: Small bilateral pleural effusions are noted. Mildly increased bilateral airspace opacification may reflect pulmonary edema or pneumonia. No pneumothorax is seen. The cardiomediastinal silhouette is mildly enlarged. No acute osseous abnormalities are identified. IMPRESSION: Small bilateral pleural effusions noted. Mildly increased bilateral airspace opacification may reflect pulmonary edema or pneumonia. Mild cardiomegaly. Electronically Signed   By: Garald Balding M.D.   On: 01/06/2016 06:48    Assessment & Plan   1. Acute on chronic systolic CHF  - presented with chest discomfort and worsening dyspnea. Echo this admission shows a newly decreased EF of 20-25% with diffuse HK as compared to her prior EF of 50-55% in 04/2015. EF 25% by cath.  - unable to tolerate Entresto due to low BP. On metoprolol 50 mg bid now. On oral lasix 40 mg daily. Low dose ARB started and BP stable. LV filling pressures and right heart pressures were normal at cath.  -  Net -9L since admission. Weight down 17 lbs. Dry weight less than 150 lbs, currently at 149 lbs today. Given Lasix 40mg  IV yesterday for increased SOB. BNP  Was elevated at 734 and chest xray showed persistence of CHF. Continue IV Lasix for now. - Felt Afib with poorly controlled rate may be contributing to her CHF.   2. CAD  - s/p stenting of OM in 2007. Cardiac cath shows nonobstructive disease. ASA stopped since she is on coumadin.  - continue  statin, and BB. Now on Heparin per pharmacy and coumadin resumed post cath. When INR >2 can stop heparin.  INR today 1.66.  3. Diabetes  - with ulceration of toe. Per IM.  - Metformin held.  4. Hyperlipidemia - continue statin therapy.  5. Persistent Atrial Fibrillation - since July 2016. On amiodarone and metoprolol for rate control (Toprol-XL increased from 25 mg bid to 50 mg bid).  Amio increased yestedray to 200mg  BID for better rate control.  - This patients CHA2DS2-VASc Score and unadjusted Ischemic Stroke Rate (% per year) is equal to 12.2 % stroke rate/year from a score of 9 (CHF, HTN, DM, Vascular, Female, Age (2), TIA(2)). Coumadin to be resumed post cath.  6. Hypokalemia - improved but still < 4.  Will replete today. Continue to monitor.             Shannon Him, MD  01/08/2016  11:12 AM

## 2016-01-08 NOTE — Progress Notes (Addendum)
Progress Note    Shannon [name]  [id number] DOB: 1932-04-30  DOA: 12/30/2015 PCP: Haywood Pao, MD    Brief Narrative:   Shannon [name] is an 82 y.o. female with CAD status post stenting, CHF, paroxysmal atrial fibrillation, diabetes mellitus type 2, hyperlipidemia was brought to the ER after patient was complaining of abdominal pain and shortness of breath.. On exam patient also has bilateral lower extremity edema and wound of the left great toe. Chest x-ray shows features concerning for pneumonia and BNP is elevated. Patient at this time will be admitted for acute respiratory failure probably from CHF and pneumonia. 7/7 pt underwent cardiac cath, shows stable CAD, maximize medical therapy.  7/8 worsening sob, earlier this am , repeat CXR shows mild increase in interstitial edema, she received 2 doses of IV lasix .  7/9 pt seen and she was mouth breathing, on 5 lit of [place] oxygen, currently on IV lasix.    Assessment/Plan:   Principal Problem:   Acute respiratory failure with hypoxia (HCC) Active Problems:   Chronic atrial fibrillation (HCC)   Abdominal pain   CAD (coronary artery disease)   Cardiomyopathy (HCC)   Chronic systolic CHF (congestive heart failure) (HCC)   Elevated troponin   Acute on chronic systolic CHF (congestive heart failure) (HCC)   Leg swelling   SOB (shortness of breath)   Acute respiratory failure with hypoxia/Acute systolic CHF -Likely multi-factorial including heart failure and possible pneumonia -Upon admission to the ED, SPO2 87%, continue oxygen supplementation,plan to wean her off the oxygen.  - started on IV lasix and changed to po lasix on 7/6, changed to IV lasix on 7/8, as she was sob, and her Cxr shows worsening interstitial edema and elevated BNP.  -Chest x-ray: Airspace opacities with effusions bilaterally. Repeat CXR shows slight increase int he interstitial edema.  -Echocardiogram 04/07/2015 showed an EF of 50-55% -  repeat Echocardiogram 01/01/2016: EF 20-25%, diffuse hypokinesis, Severe TR with severely elevated pulmonary pressure -Monitor intake and output, daily weights. Negative balance of 8.7 lit since admission, weight down by 18 lbs.  -completed the course of antibiotics by 7/7.  -Although ddimer elevated, unlikely PE, as patient's INR has been supratherapeutic on admission. -LE doppler negative for DVT or SVT -Cardiology consulted and recommendations appreciated. - underwent cardiac cath on 7/7 showed widely patent stents. Minimal CAD in LAD. Mild to mod pulm hypertension.  - added on tessalon and changed to IV lasix.  - monitor renal parameters while on IV lasix and replete potassium as needed.   Abdominal pain -Improved  -CT abdomen and pelvis: small to moderate volume ascites, cholelithiasis, negative for bowel obstruction, perforation or ambulatory change. Bilateral pleural effusions, right greater than left. -Continue to monitor, pain control  Coronary artery disease -Denies chest pain -Troponin mildly elevated but trending downward (0.1, 0.07, 0.06, 0.05) -Continue aspirin, statin, metoprolol.  - cardiac cath shows minimal CAD in LAD, patent stents.    Atrial fibrillation, chronic -CHADSVASC 4 (age, gender, DM) - Rate slightly elevated, metoprolol increased by cardiology. Heparin started and resume coumadin, monitor INR.  - subtherapeutic INR.   Left great toe diabetic wound: Wound care consulted and resume local wound care.  No surrounding cellulitis.   Diabetes mellitus, type II -Metformin held, continued findings for CBG monitoring CBG (last 3)   Recent Labs  01/08/16 0630 01/08/16 1146 01/08/16 1620  GLUCAP 96 124* 146*    Resume SSI.   Hyperlipidemia -Continue statin  Anemia  of chronic disease.  -Continue iron supplementation -Baseline hemoglobin approximately 10, currently around 10.5  Hypokalemia/hypomagnesemia -Secondary to diuretics - repleted and  repeat in am shows normal values.    Family Communication/Anticipated D/C date and plan/Code Status   DVT prophylaxis: IV heparin and coumadin.  Code Status: Full Code.  Family Communication: none at bedside.  Disposition Plan: pending further investigation    Medical Consultants:    cardiology   Procedures:   Cardiac cath scheduled for 7/7 Anti-Infectives:   Anti-infectives    Start     Dose/Rate Route Frequency Ordered Stop   01/02/16 1300  cefTRIAXone (ROCEPHIN) 2 g in dextrose 5 % 50 mL IVPB  Status:  Discontinued     2 g 100 mL/hr over 30 Minutes Intravenous Every 24 hours 01/02/16 1156 01/06/16 1808   01/01/16 0500  vancomycin (VANCOCIN) IVPB 1000 mg/200 mL premix  Status:  Discontinued     1,000 mg 200 mL/hr over 60 Minutes Intravenous Every 24 hours 12/31/15 0440 01/02/16 1156   12/31/15 1400  aztreonam (AZACTAM) 1 g in dextrose 5 % 50 mL IVPB  Status:  Discontinued     1 g 100 mL/hr over 30 Minutes Intravenous Every 8 hours 12/31/15 0440 01/02/16 1156   12/31/15 0600  aztreonam (AZACTAM) 2 g in dextrose 5 % 50 mL IVPB  Status:  Discontinued     2 g 100 mL/hr over 30 Minutes Intravenous Every 8 hours 12/31/15 0428 12/31/15 0435   12/31/15 0130  aztreonam (AZACTAM) 1 g in dextrose 5 % 50 mL IVPB     1 g 100 mL/hr over 30 Minutes Intravenous  Once 12/31/15 0121 12/31/15 0338   12/31/15 0130  vancomycin (VANCOCIN) IVPB 1000 mg/200 mL premix     1,000 mg 200 mL/hr over 60 Minutes Intravenous NOW 12/31/15 0122 12/31/15 0247      Subjective:    Shannon [name] denies any new pain, on 5 lit [place] oxygen, still feeling sob.   Objective:    Filed Vitals:   01/08/16 0012 01/08/16 0519 01/08/16 0528 01/08/16 1431  BP: 113/78 113/72  94/66  Pulse: 95 103  94  Temp:  97.7 F (36.5 C)  97.9 F (36.6 C)  TempSrc:  Oral  Oral  Resp: 24 21  22   Height:      Weight:   67.9 kg (149 lb 11.1 oz)   SpO2: 95% 95%  93%    Intake/Output Summary (Last 24 hours) at  01/08/16 1740 Last data filed at 01/08/16 1300  Gross per 24 hour  Intake    350 ml  Output    700 ml  Net   -350 ml   Filed Weights   01/06/16 0528 01/07/16 0403 01/08/16 0528  Weight: 67.178 kg (148 lb 1.6 oz) 70.308 kg (155 lb) 67.9 kg (149 lb 11.1 oz)    Exam: General exam: Appears calm and comfortable. Not in distress on 5 lit of [place] oxygen.  Respiratory system: Clear to auscultation. Respiratory effort normal. Cardiovascular system: S1 & S2 heard, RRR. No JVD,  rubs, gallops or clicks. No murmurs. Gastrointestinal system: Abdomen is nondistended, soft and nontender. No organomegaly or masses felt. Normal bowel sounds heard. Central nervous system: Alert and oriented. No  New focal neurological deficits. Extremities: bilateral 2 + edema. Left great toe wound.  Psychiatry: Judgement and insight appear normal. Mood & affect appropriate.   Data Reviewed:   I have personally reviewed following labs and imaging studies:  Labs: Basic  Metabolic Panel:  Recent Labs Lab 01/02/16 0306  01/04/16 0325 01/05/16 0842 01/06/16 0258 01/06/16 2354 01/08/16 0338  NA 136  < > 139 138 137 137 135  K 3.3*  < > 4.2 4.1 4.0 4.0 3.5  CL 102  < > 101 100* 99* 100* 99*  CO2 27  < > 31 30 32 31 28  GLUCOSE 123*  < > 169* 167* 158* 145* 121*  BUN 18  < > 17 17 22* 21* 21*  CREATININE 0.93  < > 0.86 0.85 0.89 0.77 0.76  CALCIUM 8.1*  < > 8.1* 8.5* 8.4* 8.4* 8.5*  MG 1.3*  --  1.7  --   --   --   --   < > = values in this interval not displayed. GFR Estimated Creatinine Clearance: 49 mL/min (by C-G formula based on Cr of 0.76). Liver Function Tests: No results for input(s): AST, ALT, ALKPHOS, BILITOT, PROT, ALBUMIN in the last 168 hours. No results for input(s): LIPASE, AMYLASE in the last 168 hours. No results for input(s): AMMONIA in the last 168 hours. Coagulation profile  Recent Labs Lab 01/05/16 0330 01/06/16 0258 01/06/16 2354 01/07/16 1238 01/08/16 0338  INR 1.99* 1.75*  1.70* 1.67* 1.66*    CBC:  Recent Labs Lab 01/03/16 0302 01/04/16 0325 01/06/16 0258 01/06/16 2354 01/08/16 0338  WBC 9.2 10.6* 8.8 8.5 8.9  HGB 12.1 12.5 11.2* 10.9* 10.9*  HCT 36.9 40.0 35.5* 34.5* 36.1  MCV 103.1* 104.7* 106.0* 104.9* 105.9*  PLT 257 227 236 200 185   Cardiac Enzymes: No results for input(s): CKTOTAL, CKMB, CKMBINDEX, TROPONINI in the last 168 hours. BNP (last 3 results) No results for input(s): PROBNP in the last 8760 hours. CBG:  Recent Labs Lab 01/07/16 1543 01/07/16 2128 01/08/16 0630 01/08/16 1146 01/08/16 1620  GLUCAP 141* 153* 96 124* 146*   D-Dimer: No results for input(s): DDIMER in the last 72 hours. Hgb A1c: No results for input(s): HGBA1C in the last 72 hours. Lipid Profile: No results for input(s): CHOL, HDL, LDLCALC, TRIG, CHOLHDL, LDLDIRECT in the last 72 hours. Thyroid function studies: No results for input(s): TSH, T4TOTAL, T3FREE, THYROIDAB in the last 72 hours.  Invalid input(s): FREET3 Anemia work up: No results for input(s): VITAMINB12, FOLATE, FERRITIN, TIBC, IRON, RETICCTPCT in the last 72 hours. Sepsis Labs:  Recent Labs Lab 01/04/16 0325 01/06/16 0258 01/06/16 2354 01/08/16 0338  WBC 10.6* 8.8 8.5 8.9   Urine analysis:    Component Value Date/Time   COLORURINE YELLOW 12/30/2015 2320   APPEARANCEUR CLOUDY* 12/30/2015 2320   LABSPEC 1.020 12/30/2015 2320   PHURINE 5.0 12/30/2015 2320   GLUCOSEU NEGATIVE 12/30/2015 2320   HGBUR NEGATIVE 12/30/2015 2320   BILIRUBINUR NEGATIVE 12/30/2015 2320   KETONESUR NEGATIVE 12/30/2015 2320   PROTEINUR 30* 12/30/2015 2320   UROBILINOGEN 0.2 10/09/2012 1551   NITRITE NEGATIVE 12/30/2015 2320   LEUKOCYTESUR SMALL* 12/30/2015 2320   Microbiology No results found for this or any previous visit (from the past 240 hour(s)).  Radiology: Dg Chest 2 View  01/07/2016  CLINICAL DATA:  Short of breath, congestive heart failure. EXAM: CHEST  2 VIEW COMPARISON:  01/06/2016  FINDINGS: Mildly enlarged cardiac silhouette. Interval increase in perihilar airspace disease. This increases only mild. Bilateral pleural effusions unchanged. IMPRESSION: No significant change in congestive heart failure pattern. Mild increase in pulmonary edema. Electronically Signed   By: Suzy Bouchard M.D.   On: 01/07/2016 12:28    Medications:   . amiodarone  200 mg Oral BID  . atorvastatin  20 mg Oral Daily  . ferrous sulfate  325 mg Oral Q breakfast  . furosemide  40 mg Intravenous Daily  . insulin aspart  0-9 Units Subcutaneous TID WC  . LORazepam  0.5 mg Intravenous Once  . losartan  25 mg Oral Daily  . metoprolol succinate  50 mg Oral BID  . potassium chloride  10 mEq Oral Daily  . saccharomyces boulardii  250 mg Oral BID  . sodium chloride flush  3 mL Intravenous Q12H  . sodium chloride flush  3 mL Intravenous Q12H  . Warfarin - Pharmacist Dosing Inpatient   Does not apply q1800   Continuous Infusions: . heparin 1,000 Units/hr (01/08/16 1547)    Time spent: 25 minutes.    LOS: 8 days   [place] [facility]  [facility] Pager [phone]  *Please refer to [email or web address], password [email or web address] to get updated schedule on who will round on this patient, as hospitalists switch teams weekly. If 7PM-7AM, please contact night-coverage at [email or web address], password [email or web address] for any overnight needs.  01/08/2016, 5:40 PM    Notified by RN pt lethargic and hypoxic.  Pt seen and examined.  She is arousable, and responding to questions though very slowly.  CVS S1S2,  Lungs: Basilar crackles , no wheezing or rhonchi.  abd soft non distended.  Neuro: lethargic, able to move all extremities.    Plan: 1. CXR stat shows worsening pulm edema, vs pneumonia, ABG ordered and pending.  Ordered one dose of IV lasix 60 mg , transfer the patient to SDU for closer monitoring.  Oxygen to keep sats greater than 90%, BIPAP if needed.  Started on IV antibiotics broad spectrum.  Get SLP evaluation in am.    Updated the family at bedside.  Increased the lasix to 40 mg iv BID.  Continue to monitor.    Hosie Poisson, [place]

## 2016-01-08 NOTE — Progress Notes (Signed)
Patient was on 5 liters nasal cannula and continuous pulse oximetry monitoring throughout the morning and early afternoon with O2 saturations dropping into the low 80s when patient would eat or get up to use bedside commode.  With rest and slow, deep breathing, O2 sats returned to the mid 90's.  After eating her supper, patient complained of difficulty getting her breath and feeling like she was "choking."  MD was notified.  Patient was given 0.25 Xanax as ordered.  RN entered patient's room upon hearing O2 monitor alarming to find patient very lethargic, and slightly difficult to arouse.  Patient's O2 saturations were dropping to the mid to low 70's.  Rapid Response was called.  MD was called.  Patient was continuously aroused and encouraged to breathe deeply.  Patient was placed on VM and O2 sats rose to the upper 80's - 91%.  MD arrived and ordered patient transfer to [place].  Report was called to receiving nurse, patient was given Lasix 60 mg IV push as ordered and transferred without incident to [place].

## 2016-01-08 NOTE — Significant Event (Signed)
Rapid Response Event Note  Overview:  Called by primary RN for lethargy and sats dropping. Time Called: 1756 Arrival Time: 1800 Event Type: Respiratory  Initial Focused Assessment:  On my arrival to patients bedside RN and family at bedside.  Patient in bed with nasal cannula 5lpm on sats 88%.  Per RN patient received 0.25mg  xanax about 1 hour ago, patient is lethargic.  AS per RN patient is looking more dusky and pale than earlier in the shift.  Skin is warm and dry.  Patient states it is hard for her to get a breath and is having trouble breathing.  Patient also states she is hot.     Interventions:  Placed patient on vm 35%, repositioned patient in the bed.  Breath sounds decreased with fine crackles.   sats on vm 89-91%.  Dr Karleen Hampshire notified and at bedside  Plan of Care (if not transferred):  Patient to transfer to SDU  Event Summary:  patietn to transfer to SDU Rn to call if assistance needed   at      at          [facility], Harlin Rain

## 2016-01-08 NOTE — Progress Notes (Signed)
Pharmacy Antibiotic Note  Shannon [name] is a 82 y.o. female admitted on 12/30/2015 with pneumonia.  Pharmacy has been consulted for vanc/cefepime dosing. Rapid response called tonight for lethargy, sats dropping. AF, wbc wnl, SCr stable 0.76, CrCl~49. No cultures.  Plan: Cefepime 2g IV q24h - may need to adjust up Vanc 1500mg  IV x1; then 500mg  IV q12h Monitor clinical progress, c/s, renal function, abx plan/LOT VT@SS  as indicated   Height: 5\' 2"  (157.5 cm) Weight: 149 lb 11.1 oz (67.9 kg) IBW/kg (Calculated) : 50.1  Temp (24hrs), Avg:97.7 F (36.5 C), Min:97.6 F (36.4 C), Max:97.9 F (36.6 C)   Recent Labs Lab 01/03/16 0302 01/04/16 0325 01/05/16 0842 01/06/16 0258 01/06/16 2354 01/08/16 0338  WBC 9.2 10.6*  --  8.8 8.5 8.9  CREATININE 0.81 0.86 0.85 0.89 0.77 0.76    Estimated Creatinine Clearance: 49 mL/min (by C-G formula based on Cr of 0.76).    Allergies  Allergen Reactions  . Demerol Other (See Comments)    Burning sensation all over body  . Erythromycin Nausea And Vomiting  . Penicillins Other (See Comments)    unknown  . Percocet [Oxycodone-Acetaminophen] Swelling    Antimicrobials this admission: 7/3 rocephin>>7/7 7/1 Aztreonam>> 7/3 7/1 Vanc>> 7/3; 7/9>> 7/9 zosyn>>  Dose adjustments this admission:   Microbiology results:   Elicia Lamp, PharmD, [facility] Clinical Pharmacist Pager [phone] 01/08/2016 8:56 PM

## 2016-01-08 NOTE — Progress Notes (Signed)
ANTICOAGULATION CONSULT NOTE - Follow Up Consult  Pharmacy Consult for Heparin/warfarin Indication: atrial fibrillation  Allergies  Allergen Reactions  . Demerol Other (See Comments)    Burning sensation all over body  . Erythromycin Nausea And Vomiting  . Penicillins Other (See Comments)    unknown  . Percocet [Oxycodone-Acetaminophen] Swelling    Patient Measurements: Height: 5\' 2"  (157.5 cm) Weight: 149 lb 11.1 oz (67.9 kg) IBW/kg (Calculated) : 50.1  Vital Signs: Temp: 97.7 F (36.5 C) (07/09 0519) Temp Source: Oral (07/09 0519) BP: 113/72 mmHg (07/09 0519) Pulse Rate: 103 (07/09 0519)  Labs:  Recent Labs  01/06/16 0258 01/06/16 2354 01/07/16 0314 01/07/16 1238 01/08/16 0338  HGB 11.2* 10.9*  --   --  10.9*  HCT 35.5* 34.5*  --   --  36.1  PLT 236 200  --   --  185  LABPROT 20.4* 20.0*  --  19.7* 19.6*  INR 1.75* 1.70*  --  1.67* 1.66*  HEPARINUNFRC 0.57 0.47 0.49  --  0.42  CREATININE 0.89 0.77  --   --  0.76    Estimated Creatinine Clearance: 49 mL/min (by C-G formula based on Cr of 0.76).   Assessment: 82yo F continuing on heparin and warfarin for afib. Warfarin PTA restarted post cath on 7/7. Heparin level remains therapeutic 0.42, INR 1.66 stable. Hgb 10.9 stable, PLT wnl, no bleed noted  PTA warfarin dose 1.25 mg daily  Goal of Therapy:  Heparin level 0.3-0.7 units/ml  INR 2-3 Monitor platelets by anticoagulation protocol: Yes   Plan:  -Cont heparin at 1000 units/hr -Warfarin 2 mg x1 tonight - probably back to home dose tomorrow -Daily HL, INR, CBC -Monitor s/sx of bleeding   Gwenlyn Perking, PharmD PGY1 Pharmacy Resident Pager: [phone]  01/08/2016,11:05 AM

## 2016-01-08 NOTE — Progress Notes (Signed)
Patient's son Louie Casa was called and told what had transpired with patient's condition and made aware of patient's transfer.

## 2016-01-09 ENCOUNTER — Encounter (HOSPITAL_COMMUNITY): Payer: Self-pay | Admitting: Interventional Cardiology

## 2016-01-09 ENCOUNTER — Inpatient Hospital Stay (HOSPITAL_COMMUNITY): Payer: Commercial Managed Care - HMO

## 2016-01-09 DIAGNOSIS — L8990 Pressure ulcer of unspecified site, unspecified stage: Secondary | ICD-10-CM | POA: Insufficient documentation

## 2016-01-09 DIAGNOSIS — I5023 Acute on chronic systolic (congestive) heart failure: Principal | ICD-10-CM

## 2016-01-09 DIAGNOSIS — J9601 Acute respiratory failure with hypoxia: Secondary | ICD-10-CM

## 2016-01-09 LAB — GLUCOSE, CAPILLARY
GLUCOSE-CAPILLARY: 139 mg/dL — AB (ref 65–99)
GLUCOSE-CAPILLARY: 89 mg/dL (ref 65–99)
Glucose-Capillary: 108 mg/dL — ABNORMAL HIGH (ref 65–99)
Glucose-Capillary: 110 mg/dL — ABNORMAL HIGH (ref 65–99)

## 2016-01-09 LAB — BASIC METABOLIC PANEL
ANION GAP: 9 (ref 5–15)
BUN: 22 mg/dL — ABNORMAL HIGH (ref 6–20)
CALCIUM: 8.4 mg/dL — AB (ref 8.9–10.3)
CHLORIDE: 98 mmol/L — AB (ref 101–111)
CO2: 29 mmol/L (ref 22–32)
Creatinine, Ser: 0.86 mg/dL (ref 0.44–1.00)
GFR calc Af Amer: >60 mL/min (ref >60)
GFR calc non Af Amer: >60 mL/min (ref >60)
GLUCOSE: 114 mg/dL — AB (ref 65–99)
Potassium: 3.7 mmol/L (ref 3.5–5.1)
Sodium: 136 mmol/L (ref 135–145)

## 2016-01-09 LAB — CBC
HCT: 34.2 % — ABNORMAL LOW (ref 36.0–46.0)
Hemoglobin: 10.8 g/dL — ABNORMAL LOW (ref 12.0–15.0)
MCH: 32.9 pg (ref 26.0–34.0)
MCHC: 31.6 g/dL (ref 30.0–36.0)
MCV: 104.3 fL — AB (ref 78.0–100.0)
PLATELETS: 184 10*3/uL (ref 150–400)
RBC: 3.28 MIL/uL — AB (ref 3.87–5.11)
RDW: 17.5 % — AB (ref 11.5–15.5)
WBC: 7.7 10*3/uL (ref 4.0–10.5)

## 2016-01-09 LAB — HEPARIN LEVEL (UNFRACTIONATED): Heparin Unfractionated: 0.34 IU/mL (ref 0.30–0.70)

## 2016-01-09 LAB — MRSA PCR SCREENING: MRSA by PCR: POSITIVE — AB

## 2016-01-09 LAB — PROTIME-INR
INR: 1.73 — AB (ref 0.00–1.49)
Prothrombin Time: 20.3 seconds — ABNORMAL HIGH (ref 11.6–15.2)

## 2016-01-09 MED ORDER — DIGOXIN 0.25 MG/ML IJ SOLN
0.1250 mg | Freq: Four times a day (QID) | INTRAMUSCULAR | Status: DC
Start: 2016-01-09 — End: 2016-01-09
  Administered 2016-01-09: 0.125 mg via INTRAVENOUS

## 2016-01-09 MED ORDER — FUROSEMIDE 10 MG/ML IJ SOLN
INTRAMUSCULAR | Status: AC
  Filled 2016-01-09: qty 4

## 2016-01-09 MED ORDER — DIGOXIN 0.25 MG/ML IJ SOLN
0.1250 mg | Freq: Every day | INTRAMUSCULAR | Status: DC
  Filled 2016-01-09: qty 2

## 2016-01-09 MED ORDER — FUROSEMIDE 10 MG/ML IJ SOLN
80.0000 mg | Freq: Four times a day (QID) | INTRAMUSCULAR | Status: DC
  Administered 2016-01-09: 80 mg via INTRAVENOUS

## 2016-01-09 MED ORDER — FUROSEMIDE 10 MG/ML IJ SOLN
80.0000 mg | Freq: Two times a day (BID) | INTRAMUSCULAR | Status: DC
  Administered 2016-01-09 – 2016-01-14 (×10): 80 mg via INTRAVENOUS
  Filled 2016-01-09 (×11): qty 8

## 2016-01-09 MED ORDER — DIGOXIN 125 MCG PO TABS
0.1250 mg | ORAL_TABLET | Freq: Every day | ORAL | Status: DC
  Administered 2016-01-10 – 2016-01-15 (×6): 0.125 mg via ORAL
  Filled 2016-01-09 (×6): qty 1

## 2016-01-09 MED ORDER — MUPIROCIN 2 % EX OINT
TOPICAL_OINTMENT | Freq: Two times a day (BID) | CUTANEOUS | Status: DC
  Administered 2016-01-09 – 2016-01-11 (×5): via NASAL
  Administered 2016-01-11: 1 via NASAL
  Administered 2016-01-12 – 2016-01-13 (×4): via NASAL
  Administered 2016-01-14: 1 via NASAL
  Administered 2016-01-14 – 2016-01-16 (×4): via NASAL
  Filled 2016-01-09 (×3): qty 22

## 2016-01-09 MED ORDER — SODIUM CHLORIDE 0.9% FLUSH
10.0000 mL | INTRAVENOUS | Status: DC | PRN
  Administered 2016-01-14: 10 mL
  Filled 2016-01-09: qty 40

## 2016-01-09 MED ORDER — SODIUM CHLORIDE 0.9% FLUSH
10.0000 mL | Freq: Two times a day (BID) | INTRAVENOUS | Status: DC
  Administered 2016-01-09 – 2016-01-16 (×9): 10 mL

## 2016-01-09 MED ORDER — WARFARIN SODIUM 2.5 MG PO TABS
1.2500 mg | ORAL_TABLET | Freq: Once | ORAL | Status: AC
Start: 2016-01-09 — End: 2016-01-09
  Administered 2016-01-09: 1.25 mg via ORAL
  Filled 2016-01-09: qty 0.5

## 2016-01-09 MED ORDER — FUROSEMIDE 10 MG/ML IJ SOLN: 80.0000 mg | Freq: Two times a day (BID) | INTRAMUSCULAR | Status: DC

## 2016-01-09 MED ORDER — POTASSIUM CHLORIDE CRYS ER 20 MEQ PO TBCR
20.0000 meq | EXTENDED_RELEASE_TABLET | Freq: Three times a day (TID) | ORAL | Status: DC
  Administered 2016-01-09 – 2016-01-13 (×15): 20 meq via ORAL
  Filled 2016-01-09 (×15): qty 1

## 2016-01-09 MED ORDER — CHLORHEXIDINE GLUCONATE CLOTH 2 % EX PADS
6.0000 | MEDICATED_PAD | Freq: Every day | CUTANEOUS | Status: AC
  Administered 2016-01-10 – 2016-01-15 (×6): 6 via TOPICAL

## 2016-01-09 NOTE — Progress Notes (Signed)
Spoke with Patty, RN patient is due to be picked up by radiology for CT soon.  Requested that consult be placed once patient returns so that PICC RN is aware that patient is back and ready for PICC placement.  Carolee Rota, RN VAST

## 2016-01-09 NOTE — Progress Notes (Signed)
Pt. placed on BiPAP per order with M/FFM tolerating well, Rt to monitor.

## 2016-01-09 NOTE — Progress Notes (Signed)
Pt wanted off BIPAP and on venti mask this am.  Pt also turned for bedpan.  Pt recovered much faster than previous time.  Tolerating venti mask well with sats in the mid 90's. Resting comfortably.

## 2016-01-09 NOTE — Progress Notes (Signed)
Pt was tolerating venti-mask at 35% until she had to roll over to use bedpan.  Pt could not recover and sats remained in lower 80's.  BIPAP was applied by RT and pt sats in 90's, resting comfortably.  Will continue to monitor.

## 2016-01-09 NOTE — Progress Notes (Addendum)
Advanced Heart Failure Rounding Note   Subjective:   Admitted with chest pain and dyspnea. Diuresing ed with IV lasix. EF down from 50-55% in 2016 to 20-25% this admit. Cath as noted below ok.   Has been in A fib with uncontrolled rate so amio increased to 200 mg twice a day( has been on amio since 2016)  and toprol-xl was increased to 50 mg twice a day. Today amio stopped due to concern for amio toxicity and ongoing dyspnea.   Ongoing dyspnea with exertion.   RHC/LHC 01/06/16 Minimal obstructive CAD Patent Stent OM1 RA 13 PA Mean 31 PCWP 16 CO/CI 3.96/2/36 PVR 3.8   ECHO 01/01/16 EF 20-25% Mild MR, dilated left atrium, TR severe, Peak PA pressure 63 mm hg, small pericardial effusion.   ECHO 04/2015 EF 50-55% moderate TR RA moderately Objective:   Weight Range:  Vital Signs:   Temp:  [97 F (36.1 C)-97.9 F (36.6 C)] 97.6 F (36.4 C) (07/10 1145) Pulse Rate:  [89-105] 92 (07/10 1200) Resp:  [12-23] 12 (07/10 1200) BP: (91-115)/(60-94) 91/60 mmHg (07/10 1200) SpO2:  [88 %-98 %] 93 % (07/10 1200) FiO2 (%):  [35 %] 35 % (07/10 0645) Weight:  [152 lb 1.9 oz (69 kg)] 152 lb 1.9 oz (69 kg) (07/10 0344) Last BM Date: 01/07/16  Weight change: Filed Weights   01/07/16 0403 01/08/16 0528 01/09/16 0344  Weight: 155 lb (70.308 kg) 149 lb 11.1 oz (67.9 kg) 152 lb 1.9 oz (69 kg)    Intake/Output:   Intake/Output Summary (Last 24 hours) at 01/09/16 1222 Last data filed at 01/09/16 1200  Gross per 24 hour  Intake   1548 ml  Output   1500 ml  Net     48 ml     Physical Exam: General:  Elderly. Chronically ill appearing. No resp difficulty HEENT: normal Neck: supple. JVP to jaw. Carotids 2+ bilat; no bruits. No lymphadenopathy or thryomegaly appreciated. Cor: PMI nondisplaced. Irregular  rate & rhythm. No rubs, gallops.  Lungs: Crackles in the bases. On 6 liters oxygen  Abdomen: soft, nontender, nondistended. No hepatosplenomegaly. No bruits or masses. Good bowel  sounds. Extremities: no cyanosis, clubbing, rash, R and LLE coban wrap. edema Neuro: alert & orientedx3, cranial nerves grossly intact. moves all 4 extremities w/o difficulty. Affect pleasant  Telemetry:  A Fib 100s   Labs: Basic Metabolic Panel:  Recent Labs Lab 01/04/16 0325 01/05/16 0842 01/06/16 0258 01/06/16 2354 01/08/16 0338 01/09/16 0548  NA 139 138 137 137 135 136  K 4.2 4.1 4.0 4.0 3.5 3.7  CL 101 100* 99* 100* 99* 98*  CO2 31 30 32 31 28 29   GLUCOSE 169* 167* 158* 145* 121* 114*  BUN 17 17 22* 21* 21* 22*  CREATININE 0.86 0.85 0.89 0.77 0.76 0.86  CALCIUM 8.1* 8.5* 8.4* 8.4* 8.5* 8.4*  MG 1.7  --   --   --   --   --     Liver Function Tests: No results for input(s): AST, ALT, ALKPHOS, BILITOT, PROT, ALBUMIN in the last 168 hours. No results for input(s): LIPASE, AMYLASE in the last 168 hours. No results for input(s): AMMONIA in the last 168 hours.  CBC:  Recent Labs Lab 01/04/16 0325 01/06/16 0258 01/06/16 2354 01/08/16 0338 01/09/16 0548  WBC 10.6* 8.8 8.5 8.9 7.7  HGB 12.5 11.2* 10.9* 10.9* 10.8*  HCT 40.0 35.5* 34.5* 36.1 34.2*  MCV 104.7* 106.0* 104.9* 105.9* 104.3*  PLT 227 236 200 185  184    Cardiac Enzymes: No results for input(s): CKTOTAL, CKMB, CKMBINDEX, TROPONINI in the last 168 hours.  BNP: BNP (last 3 results)  Recent Labs  12/30/15 2327 01/07/16 1038  BNP 593.0* 734.1*    ProBNP (last 3 results) No results for input(s): PROBNP in the last 8760 hours.    Other results:  Imaging: Dg Chest 2 View  01/07/2016  CLINICAL DATA:  Short of breath, congestive heart failure. EXAM: CHEST  2 VIEW COMPARISON:  01/06/2016 FINDINGS: Mildly enlarged cardiac silhouette. Interval increase in perihilar airspace disease. This increases only mild. Bilateral pleural effusions unchanged. IMPRESSION: No significant change in congestive heart failure pattern. Mild increase in pulmonary edema. Electronically Signed   By: Suzy Bouchard M.D.   On:  01/07/2016 12:28   Dg Chest Port 1 View  01/08/2016  CLINICAL DATA:  Acute onset of dyspnea.  Initial encounter. EXAM: PORTABLE CHEST 1 VIEW COMPARISON:  Chest radiograph performed 01/07/2016 FINDINGS: Small bilateral pleural effusions are noted. Patchy bilateral airspace opacities may reflect pulmonary edema or possibly pneumonia. No pneumothorax is seen. The cardiomediastinal silhouette is mildly enlarged. No acute osseous abnormalities are identified. IMPRESSION: Small bilateral pleural effusions noted. Patchy bilateral airspace opacities may reflect pulmonary edema or possibly pneumonia. Mild cardiomegaly. Electronically Signed   By: Garald Balding M.D.   On: 01/08/2016 19:27      Medications:     Scheduled Medications: . atorvastatin  20 mg Oral Daily  . ceFEPime (MAXIPIME) IV  2 g Intravenous Q24H  . Chlorhexidine Gluconate Cloth  6 each Topical Q0600  . digoxin  0.125 mg Intravenous Q6H  . [START ON 01/10/2016] digoxin  0.125 mg Intravenous Daily  . ferrous sulfate  325 mg Oral Q breakfast  . furosemide  80 mg Intravenous Q6H  . insulin aspart  0-9 Units Subcutaneous TID WC  . LORazepam  0.5 mg Intravenous Once  . losartan  25 mg Oral Daily  . metoprolol succinate  50 mg Oral BID  . mupirocin ointment   Nasal BID  . potassium chloride  20 mEq Oral TID  . saccharomyces boulardii  250 mg Oral BID  . sodium chloride flush  3 mL Intravenous Q12H  . sodium chloride flush  3 mL Intravenous Q12H  . vancomycin  500 mg Intravenous Q12H  . Warfarin - Pharmacist Dosing Inpatient   Does not apply q1800     Infusions: . heparin 1,050 Units/hr (01/09/16 1200)     PRN Medications:  sodium chloride, acetaminophen, ALPRAZolam, benzonatate, HYDROcodone-acetaminophen, nitroGLYCERIN, ondansetron **OR** ondansetron (ZOFRAN) IV, ondansetron (ZOFRAN) IV, sodium chloride flush   Assessment/Plan/Discussion  1. New onset of a cardiomyopathy with A/C Systolic heart failure .  ECHO EF 7/2  20-5% from 04/2015 EF 50-55%. 01/06/16 LHC ok. RHC ok with adequate cardiac output.  NYHA IIIb. Volume status elevated. Continue IV lasix. Continue  metoprolol XL 50 mg twice a day. Continue losartan 25 mg daily. Continue digoxin 0.125 mg daily. Stop IV dig.  2. CAD-DES OM1 2007. LHC 01/06/16 Widely patent circumflex/obtuse marginal stent.  On coumadin + statin.  3. DMII- sliding scale.  4.  Hyperlipidemia-on statin 5. History of TIA- 6. Chronic A fib-at least since August 2016. Has been on amio for rate control. I dont see where she has been cardioverted before. On ECHO left atrium dilated so not sure we could get back in NSR. Likely need to focus on rate control.  Pharmacy dosing coumadin. On heparin drip. ? amio toxicity. CXR on 7/9  with ? Pulmonary edema.  CCM following. CT of chest pending.  7. Toe ulcer 8. Severe TR -ECHO 01/01/16.  9. HCAP- on antibiotics.  In 6 liters [place]  Length of Stay: [place] NP-C 01/09/2016, 12:22 PM  Advanced Heart Failure Team Pager [phone] (M-F; 7a - 4p)  Please contact [facility] for night-coverage after hours (4p -7a ) and weekends on [email or web address]  Patient seen with NP, agree with the above note.  1. Atrial fibrillation: Appears chronic since 8/16.  HR appears to have been poorly controlled chronically.  Now down to 90s on digoxin + amiodarone + Toprol XL 50 mg bid, but now there is concern for amiodarone lung toxicity with bilateral patchy infiltrates on CXR and worsening dyspnea despite diuresis.  - Holding amiodarone for now.  Pulmonary has seen, will get chest CT => will not restart amiodarone until after we've seen the CT and discussed with pulmonary.  - Rate control difficult.  Continue Toprol XL 50 mg bid and continue digoxin 0.125.  No BP room to increase Toprol XL further.  Would ideally cardiovert her, not sure that she will hold NSR without some sort of rhythm control med and not sure she will hold even then.  No amiodarone for now, no Tikosyn  b/c she just stopped amiodarone and has been on it for months.  May end up considering ablation/pacing given significant fall in EF with worry for tachy-mediated CMP.  Will ask for EP opinion.  - On heparin gtt while INR subtherapeutic on warfarin.  2. Acute systolic CHF: New fall in EF to 20-25%, nonischemic cardiomyopathy with no obstructive disease (patent OM stent).  Also with severe TR. I am concerned that there is a component of tachycardia-mediated cardiomyopathy.  She still appears to have some volume overload on exam though difficult exam with severe TR.  - Increase Lasix to 80 mg IV bid.  - Continue losartan, Toprol XL, and digoxin at current doses, no room for titration.  3. Severe TR 4. CAD: Nonobstructive on cath this admission.   40 minutes critical care time.   [name] 01/09/2016 3:51 PM

## 2016-01-09 NOTE — Consult Note (Signed)
ELECTROPHYSIOLOGY CONSULT NOTE    Patient ID: Shannon [name] MRN: [id number], DOB/AGE: April 11, 1932 82 y.o.  Admit date: 12/30/2015 Date of Consult: 01/10/2016  Primary Physician: Haywood Pao, MD Primary Cardiologist: Dr. Acie Fredrickson Requesting MD: Dr. [name]  Reason for Consultation: AFib rate/rhythm management options  HPI: Shannon [name] is a 82 y.o. female admitted to [facility] 12/30/15 with acute CHF exacerbation, new diagnosis of CM since Oct 2016 was 50-55%, now 20-25%, PMHx also noted for PAFib CAD with PCI in 2007, DM, TIA, OSA.  The patient came with c/o increasing SOB and an element of CP, she has since undergone cardiac cath without significant CAD EF was noted to be significantly worsened from previous, and has been diuresed.  She has had ongoing SOB despite diuresis and concern for amio toxicity and this was stopped.  EP is asked to evaluate the patient for AFib rate/rhythm management options with concerns AF is playing role in worsening EF.  The patient this morning reports feeling much better then when she first arrived, no ongoing CP, no overt feeling of palpitations, her SOB she reports is about 50% improved.   AFib Hx: 10/09/12: EPS, left atrial flutter not ablated secondary to advancing age and comorbidities (Dr. Rayann Heman) 10/09/12; DCCV Pradaxa >> GIB >> coumadin Oct 2016 started on amiodarone for PAF episodes   Past Medical History  Diagnosis Date  . CAD (coronary artery disease)     a. 05/2006 Cath/PCI: LAD 37m, D1 40 ost, LCX nl, OM1 95 (3.0x20 Taxus DES), RCA nl, EF 40% w/ lat AK;  04/2007 Ex MV: minimal lat ischemia in area of prior infarct->low risk-> med Rx.  [name] Hyperlipidemia   . Diabetes mellitus   . MVP (mitral valve prolapse)     a. 10/2003 Echo: nl LV fxn, mild MR/TR/AS  . Myocardial infarction ([place])   . Sleep apnea   . Transient ischemic attack   . Arthritis   . Baker's cyst of knee   . Chronic atrial fibrillation (HCC)     a. on Coumadin  .  Chronic systolic (congestive) heart failure ([place])     a. 04/2015: EF 50-55% b. echo 12/2015: EF 20-25% w/ diffuse HK, biatrial enlargement, mild AI and MR, severe TR       Surgical History:  Past Surgical History  Procedure Laterality Date  . Tonsillectomy    . Coronary angioplasty      Status post PTCA and stenting of the first obtuse marginal-05/13/2006. We placed a 3.0 x 20 mm Taxus stent. It was post dilated using a 3.25 mm noncompliant balloon up to 14 atmospheres  . Cataracts    . Atrial flutter ablation N/A 10/09/2012    Procedure: ATRIAL FLUTTER ABLATION;  Surgeon: Thompson Grayer, MD;  Location: [facility] CATH LAB;  Service: Cardiovascular;  Laterality: N/A;  . Colonoscopy N/A 10/13/2015    Procedure: COLONOSCOPY;  Surgeon: Manus Gunning, MD;  Location: [facility] ENDOSCOPY;  Service: Gastroenterology;  Laterality: N/A;  . Cardiac catheterization N/A 01/06/2016    Procedure: Right/Left Heart Cath and Coronary Angiography;  Surgeon: Belva Crome, MD;  Location: [facility] CV LAB;  Service: Cardiovascular;  Laterality: N/A;     Prescriptions prior to admission  Medication Sig Dispense Refill Last Dose  . amiodarone (PACERONE) 200 MG tablet Take 1 tablet (200 mg total) by mouth daily. 120 tablet 6 12/30/2015 at Unknown time  . atorvastatin (LIPITOR) 20 MG tablet TAKE ONE TABLET BY MOUTH ONE TIME DAILY 90 tablet 3 12/29/2015  at Unknown time  . Cholecalciferol (VITAMIN D PO) Take 1 tablet by mouth daily.   12/30/2015 at Unknown time  . Cyanocobalamin (VITAMIN B-12 PO) Take 1 tablet by mouth daily.   12/30/2015 at Unknown time  . ferrous sulfate 325 (65 FE) MG tablet Take 325 mg by mouth daily with breakfast.   12/30/2015 at Unknown time  . furosemide (LASIX) 20 MG tablet Take 20 mg by mouth daily.  0 12/30/2015 at Unknown time  . HYDROcodone-acetaminophen (NORCO/VICODIN) 5-325 MG tablet Take 0.5-1 tablets by mouth every 4 (four) hours as needed for severe pain. 6 tablet 0 Past Week at Unknown time  .  hydrocortisone (PROCTO-PAK) 1 % CREA Apply 1 application topically daily. 1 Tube 0 12/30/2015 at Unknown time  . KLOR-CON 10 10 MEQ tablet Take 10 mEq by mouth daily.  2 12/30/2015 at Unknown time  . metFORMIN (GLUCOPHAGE) 500 MG tablet Take 500 mg by mouth 2 (two) times daily.    12/30/2015 at Unknown time  . metoprolol succinate (TOPROL-XL) 50 MG 24 hr tablet Take 25 mg by mouth 2 (two) times daily. Take with or immediately following a meal.   12/30/2015 at 0800  . nitroGLYCERIN (NITROSTAT) 0.4 MG SL tablet Place 0.4 mg under the tongue every 5 (five) minutes x 3 doses as needed for chest pain. Up to 3 doses, if pain continues, call 911.   Past Week at Unknown time  . Pyridoxine HCl (VITAMIN B-6 PO) Take 1 tablet by mouth daily.   12/30/2015 at Unknown time  . saccharomyces boulardii (FLORASTOR) 250 MG capsule Take 1 capsule (250 mg total) by mouth 2 (two) times daily. 30 capsule 0 12/30/2015 at Unknown time  . SANTYL ointment Apply 1 application topically daily.    12/30/2015 at Unknown time  . warfarin (COUMADIN) 2.5 MG tablet Take as directed by [facility]. (Patient taking differently: Take 1.25 mg by mouth daily. ) 30 tablet 1 12/29/2015 at 2300  . ACCU-CHEK SMARTVIEW test strip    Taking    Inpatient Medications:  . atorvastatin  20 mg Oral Daily  . ceFEPime (MAXIPIME) IV  2 g Intravenous Q24H  . Chlorhexidine Gluconate Cloth  6 each Topical Q0600  . digoxin  0.125 mg Oral Daily  . ferrous sulfate  325 mg Oral Q breakfast  . furosemide  80 mg Intravenous BID  . insulin aspart  0-9 Units Subcutaneous TID WC  . LORazepam  0.5 mg Intravenous Once  . losartan  25 mg Oral Daily  . metoprolol succinate  50 mg Oral BID  . mupirocin ointment   Nasal BID  . potassium chloride  20 mEq Oral TID  . saccharomyces boulardii  250 mg Oral BID  . sodium chloride flush  10-40 mL Intracatheter Q12H  . sodium chloride flush  3 mL Intravenous Q12H  . sodium chloride flush  3 mL Intravenous Q12H  .  spironolactone  12.5 mg Oral Daily  . vancomycin  500 mg Intravenous Q12H  . Warfarin - Pharmacist Dosing Inpatient   Does not apply q1800    Allergies:  Allergies  Allergen Reactions  . Demerol Other (See Comments)    Burning sensation all over body  . Erythromycin Nausea And Vomiting  . Penicillins Other (See Comments)    unknown  . Percocet [Oxycodone-Acetaminophen] Swelling    Social History   Social History  . Marital Status: Married    Spouse Name: N/A  . Number of Children: N/A  . Years of  Education: N/A   Occupational History  . Not on file.   Social History Main Topics  . Smoking status: Former Smoker -- 1.00 packs/day    Types: Cigarettes    Quit date: 07/03/1963  . Smokeless tobacco: Never Used  . Alcohol Use: No  . Drug Use: No  . Sexual Activity: No   Other Topics Concern  . Not on file   Social History Narrative   Lives in [place] with husband.  Does not routinely exercise but is able to keep house and go food shopping.     Family History  Problem Relation Age of Onset  . Heart attack Father   . Heart attack Mother   . Heart attack Brother   . Coronary artery disease Sister     CABG  . Alzheimer's disease Sister     X3  . Stroke Mother   . Stroke Sister   . Heart attack Sister      Review of Systems: All other systems reviewed and are otherwise negative except as noted above.  Physical Exam: Filed Vitals:   01/10/16 0600 01/10/16 0821 01/10/16 0906 01/10/16 0910  BP: 91/73 102/81  102/81  Pulse: 67 92 89 89  Temp:  97.8 F (36.6 C)    TempSrc:  Oral    Resp: 11 18    Height:      Weight:      SpO2: 100% 99%      GEN- The patient is well appearing, alert and oriented x 3 today.   HEENT: normocephalic, atraumatic; sclera clear, conjunctiva pink; hearing intact; oropharynx clear; neck supple, + JVP Lymph- no cervical lymphadenopathy Lungs- diminished breath sounds throughout.  No wheezes, rales, rhonchi Heart- Irregular rate and  rhythm,1/6SM, no rubs or gallops, PMI not laterally displaced GI- soft, non-tender, non-distended Extremities- wrapped (unna boots) b/l LE MS- no significant deformity or atrophy Skin- warm and dry, no rash or lesion Psych- euthymic mood, full affect Neuro- no gross deficits observed  Labs:   Lab Results  Component Value Date   WBC 8.1 01/10/2016   HGB 11.1* 01/10/2016   HCT 35.8* 01/10/2016   MCV 104.1* 01/10/2016   PLT 187 01/10/2016     Recent Labs Lab 01/10/16 0440  NA 137  K 3.9  CL 95*  CO2 32  BUN 19  CREATININE 0.85  CALCIUM 8.6*  GLUCOSE 84      Radiology/Studies:  01/09/16 CT chest IMPRESSION: 1. Cylindrical bronchiectasis and patchy densities throughout the majority of both lungs, most pronounced in the upper lobes. The patchy densities could represent chronic changes associated with the bronchiectasis or areas of active inflammation or infection. 2. Moderate to large-sized right pleural effusion and moderate-sized left pleural effusion. 3. Bilateral compressive atelectasis. 4. Dense coronary artery atheromatous calcifications. 5. Aortic atherosclerosis.  Dg Chest Port 1 View 01/08/2016  CLINICAL DATA:  Acute onset of dyspnea.  Initial encounter. EXAM: PORTABLE CHEST 1 VIEW COMPARISON:  Chest radiograph performed 01/07/2016 FINDINGS: Small bilateral pleural effusions are noted. Patchy bilateral airspace opacities may reflect pulmonary edema or possibly pneumonia. No pneumothorax is seen. The cardiomediastinal silhouette is mildly enlarged. No acute osseous abnormalities are identified. IMPRESSION: Small bilateral pleural effusions noted. Patchy bilateral airspace opacities may reflect pulmonary edema or possibly pneumonia. Mild cardiomegaly. Electronically Signed   By: Garald Balding M.D.   On: 01/08/2016 19:27     EKG: Afib 90's, Afutter 102bpm TELEMETRY: AFib 80's-90's  RHC/LHC 01/06/16 Minimal obstructive CAD Patent Stent OM1 RA 13  PA Mean 31 PCWP  16 CO/CI 3.96/2/36 PVR 3.8   ECHO 01/01/16 EF 20-25% Mild MR, dilated left atrium, TR severe, Peak PA pressure 63 mm hg, small pericardial effusion.   ECHO 04/2015 EF 50-55% moderate TR RA moderately    Assessment and Plan:   1. Afib     Rate has been better controlled in the last few days, notes mention poor rate control in the last few months with uptitration of her amiodarone and BB     Amio now held with concerns by pumonary medicine of amio toxicity     CHA2DS2Vasc is at least 8, on heparin gtt >> coumadin with pharmacy management INR appears to have been consistently (with weekly or more frequent labs draws)  therapeutic or supra-therapeuticsince 12/07/15 until 01/05/16 when she was initiated and maintained on heparin gtt this admission She is planned for DCCV tomorrow as first line option for her rhythm       2.  New CM, acute CHF       SOB      On Dig, BB, ARB, aldactone      Cumulatively is fluid negative -[id number]      C/w CHF team and pulmonary  3. CAD 4. DM    Signed, Tommye Standard, PA-C 01/10/2016 9:40 AM  I have seen, examined the patient, and reviewed the above assessment and plan. On exam, elderly and volume overloaded.  Changes to above are made where necessary.  The patient has pulmonary hypertension of unknown cause with severe TR and severe RA enlargement.  Her prognosis is poor.  I also think that our ability to achieve/ maintain sinus is quite low.  That being said, she has been on amiodarone for over a year.  I think that it is probably most prudent to proceed with cardioversion.  IF she maintains sinus rhythm even for a short period of time, I think that this would be favorable to allow better CHF management and may also help determine whether or not she actually has amiodarone lung toxicity.  If she fails cardioversion or if she returns to AF in the near future, then I would consider biv pacmaker and AV nodal ablation at that time.  Co Sign: Thompson Grayer,  MD 01/10/2016 4:48 PM

## 2016-01-09 NOTE — Progress Notes (Addendum)
Patient Name: Shannon [name] Date of Encounter: 01/09/2016  Principal Problem:   Acute respiratory failure with hypoxia [facility]) Active Problems:   Chronic atrial fibrillation (HCC)   Abdominal pain   CAD (coronary artery disease)   Cardiomyopathy (HCC)   Chronic systolic CHF (congestive heart failure) (HCC)   Elevated troponin   Acute on chronic systolic CHF (congestive heart failure) (HCC)   Leg swelling   SOB (shortness of breath)   Primary Cardiologist: Dr Acie Fredrickson Patient Profile: 82 yo female w/ hx DES OM1 2007, PAF on amio and coumadin, DM, HLD was admitted 07/01 w/ resp failure, CHF, atrial fib.  SUBJECTIVE: Still SOB this am, not able to eat much at a time, has to stop and rest.  OBJECTIVE Filed Vitals:   01/09/16 0008 01/09/16 0344 01/09/16 0600 01/09/16 0645  BP: 112/78 109/84 108/88   Pulse: 93 90 96 103  Temp: 97.1 F (36.2 C) 97 F (36.1 C)    TempSrc: Oral Axillary    Resp: 20 19 13 19   Height:      Weight:  152 lb 1.9 oz (69 kg)    SpO2: 98% 97% 98% 95%    Intake/Output Summary (Last 24 hours) at 01/09/16 0744 Last data filed at 01/09/16 0600  Gross per 24 hour  Intake   1155 ml  Output   1300 ml  Net   -145 ml   Filed Weights   01/07/16 0403 01/08/16 0528 01/09/16 0344  Weight: 155 lb (70.308 kg) 149 lb 11.1 oz (67.9 kg) 152 lb 1.9 oz (69 kg)    PHYSICAL EXAM General: Well developed, well nourished, female in no acute distress. Head: Normocephalic, atraumatic.  Neck: Supple without bruits, JVD 10 cm. Lungs:  Resp regular and unlabored, rales bases. Heart: Irreg R&R, S1, S2, no S3, S4, 3/6 murmur; no rub. Abdomen: Soft, non-tender, non-distended, BS + x 4.  Extremities: No clubbing, cyanosis, 1-2+ edema.  Neuro: Alert and oriented X 3. Moves all extremities spontaneously. Psych: Normal affect.  LABS: CBC: Recent Labs  01/08/16 0338 01/09/16 0548  WBC 8.9 7.7  HGB 10.9* 10.8*  HCT 36.1 34.2*  MCV 105.9* 104.3*  PLT 185 184    INR: Recent Labs  01/09/16 0549  INR 123XX123*   Basic Metabolic Panel: Recent Labs  01/08/16 0338 01/09/16 0548  NA 135 136  K 3.5 3.7  CL 99* 98*  CO2 28 29  GLUCOSE 121* 114*  BUN 21* 22*  CREATININE 0.76 0.86  CALCIUM 8.5* 8.4*   BNP:  B NATRIURETIC PEPTIDE  Date/Time Value Ref Range Status  01/07/2016 10:38 AM 734.1* 0.0 - 100.0 pg/mL Final  12/30/2015 11:27 PM 593.0* 0.0 - 100.0 pg/mL Final   TELE:    Atrial fib, RVR at times   Radiology/Studies: Dg Chest 2 View 01/07/2016  CLINICAL DATA:  Short of breath, congestive heart failure. EXAM: CHEST  2 VIEW COMPARISON:  01/06/2016 FINDINGS: Mildly enlarged cardiac silhouette. Interval increase in perihilar airspace disease. This increases only mild. Bilateral pleural effusions unchanged. IMPRESSION: No significant change in congestive heart failure pattern. Mild increase in pulmonary edema. Electronically Signed   By: Suzy Bouchard M.D.   On: 01/07/2016 12:28   Dg Chest Port 1 View 01/08/2016  CLINICAL DATA:  Acute onset of dyspnea.  Initial encounter. EXAM: PORTABLE CHEST 1 VIEW COMPARISON:  Chest radiograph performed 01/07/2016 FINDINGS: Small bilateral pleural effusions are noted. Patchy bilateral airspace opacities may reflect pulmonary edema or possibly pneumonia. No  pneumothorax is seen. The cardiomediastinal silhouette is mildly enlarged. No acute osseous abnormalities are identified. IMPRESSION: Small bilateral pleural effusions noted. Patchy bilateral airspace opacities may reflect pulmonary edema or possibly pneumonia. Mild cardiomegaly. Electronically Signed   By: Garald Balding M.D.   On: 01/08/2016 19:27     Current Medications:  . amiodarone  200 mg Oral BID  . atorvastatin  20 mg Oral Daily  . ceFEPime (MAXIPIME) IV  2 g Intravenous Q24H  . ferrous sulfate  325 mg Oral Q breakfast  . furosemide  40 mg Intravenous BID  . insulin aspart  0-9 Units Subcutaneous TID WC  . LORazepam  0.5 mg Intravenous Once  .  losartan  25 mg Oral Daily  . metoprolol succinate  50 mg Oral BID  . potassium chloride  10 mEq Oral Daily  . saccharomyces boulardii  250 mg Oral BID  . sodium chloride flush  3 mL Intravenous Q12H  . sodium chloride flush  3 mL Intravenous Q12H  . vancomycin  500 mg Intravenous Q12H  . Warfarin - Pharmacist Dosing Inpatient   Does not apply q1800   . heparin 1,000 Units/hr (01/08/16 1547)    ASSESSMENT AND PLAN: 1. Acute on chronic systolic CHF  - presented with chest discomfort and worsening dyspnea. Echo this admission shows a newly decreased EF of 20-25% with diffuse HK as compared to her prior EF of 50-55% in 04/2015. EF 25% by cath.  - unable to tolerate Entresto due to low BP. On metoprolol XL 50 mg bid now, no doses held. Cozaar 25 mg qd started and BP stable. LV filling pressures and right heart pressures were normal at cath.  - Net -8.6L since admission. Weight down 13 lbs. Dry weight less than 150 lbs, was 149, currently 152. Lasix 40mg  IV bid restarted 07/09 for increased SOB. BNPwas elevated at 734 and chest xray showed persistence of CHF - Continue IV Lasix for now. - Afib with poorly controlled rate may be contributing to her CHF.   2. CAD  - s/p stenting of OM in 2007. Cath this admit shows nonobstructive disease. ASA d/c'd since she is on coumadin.  - continue statin, and BB. Now on Heparin per pharmacy and coumadin resumed post cath. When INR >2 can stop heparin. INR today 1.73.  3. Diabetes  - with ulceration of toe. Per IM.  - Metformin held.  4. Hyperlipidemia - continue statin therapy. Per IM  5. Persistent Atrial Fibrillation - since July 2016. On amiodarone and metoprolol for rate control (Toprol-XL increased from 25 mg bid to 50 mg bid). Amio increased 07/08 to 200mg  BID for better rate control.  - This patients CHA2DS2-VASc Score and unadjusted Ischemic Stroke Rate (% per year) is equal to 12.2 % stroke rate/year from a score of 9 (CHF, HTN, DM,  Vascular, Female, Age (2), TIA(2)). Coumadin was resumed post cath.  6. Hypokalemia - improved but still < 4. got 30 meq 07/09, will give 60 meq today. Continue to monitor.  Otherwise, per IM. With EF now low, may need LifeVest, MD advise if this would be ischemic or nonischemic CM Principal Problem:   Acute respiratory failure with hypoxia (HCC) Active Problems:   Chronic atrial fibrillation (HCC)   Abdominal pain   CAD (coronary artery disease)   Cardiomyopathy (HCC)   Chronic systolic CHF (congestive heart failure) (HCC)   Elevated troponin   Acute on chronic systolic CHF (congestive heart failure) (HCC)   Leg swelling  SOB (shortness of breath)   Signed, Rosaria Ferries , PA-C 7:44 AM 01/09/2016 The patient has been seen in conjunction with Rosaria Ferries, PA-C. All aspects of care have been considered and discussed. The patient has been personally interviewed, examined, and all clinical data has been reviewed.   The patient's chart has been reviewed from admission to this point. I performed her left and right heart catheterization which demonstrated severe left ventricular systolic dysfunction (EF 123456), relatively normal filling pressures, and mild to moderate pulmonary hypertension. There was no obstructive coronary disease.  Pertinent to her situation is persistent dyspnea despite greater than 8 liter diuresis and poor rate control of atrial fibrillation (despite beta blocker and amiodarone therapy). She has been in atrial fibrillation since July 2016 and chronically on amiodarone (I assume for Rhythm control). Last documented sinus rhythm was August 2016. "Sinus rhythm in October 2016" was likely atrial flutter with 2:1 or 3-1 AV conduction.  Concerns at this point are related to persistent ongoing dyspnea for which we need to exclude the possibility of amiodarone toxicity/PE. Atrial fibrillation with poor rate control for which we may need to consider AV node ablation with  pacemaker therapy (EP consultation). Also needs ongoing therapy of systolic dysfunction which is highly likely related to tachycardia.  We may need to get the heart failure team involved. Also consider pulmonary consult.  In the meanwhile, I will stop amio and increase diuresis. Check sed rate.

## 2016-01-09 NOTE — Progress Notes (Signed)
PT Cancellation Note  Patient Details Name: Shannon [name] MRN: [id number] DOB: 23-Feb-1932   Cancelled Treatment:    Reason Eval/Treat Not Completed: Medical issues which prohibited therapy. Pt fatigued after OT treatment. PICC to be inserted.  Benjiman Core, [place] #[phone] 01/09/2016, 5:05 PM

## 2016-01-09 NOTE — Plan of Care (Signed)
Called pt's daughter Katharine Look to update her re: pt's condition per pt request

## 2016-01-09 NOTE — Progress Notes (Signed)
Progress Note    Shannon [name]  [id number] DOB: 03-22-1932  DOA: 12/30/2015 PCP: Haywood Pao, MD    Brief Narrative:   Shannon [name] is an 82 y.o. female with CAD status post stenting, CHF, paroxysmal atrial fibrillation, diabetes mellitus type 2, hyperlipidemia was brought to the ER after patient was complaining of abdominal pain and shortness of breath.. On exam patient also has bilateral lower extremity edema and wound of the left great toe. Chest x-ray shows features concerning for pneumonia and BNP is elevated. Patient at this time will be admitted for acute respiratory failure probably from CHF and pneumonia. 7/7 pt underwent cardiac cath, shows stable CAD, maximize medical therapy.  7/8 worsening sob, earlier this am , repeat CXR shows mild increase in interstitial edema, she received 2 doses of IV lasix .  7/9 pt seen and she was mouth breathing, on 5 lit of [place] oxygen, currently on IV lasix.   7/10 pt was transferred to stepdown last night for worsening dyspnea and was on NRB transiently.  Pulmonology consulted for evaluation of amiodarone toxicity.    Assessment/Plan:   Principal Problem:   Acute respiratory failure with hypoxia (HCC) Active Problems:   Chronic atrial fibrillation (HCC)   Abdominal pain   CAD (coronary artery disease)   Cardiomyopathy (HCC)   Chronic systolic CHF (congestive heart failure) (HCC)   Elevated troponin   Acute on chronic systolic CHF (congestive heart failure) (HCC)   Leg swelling   SOB (shortness of breath)   Pressure ulcer   Acute respiratory failure with hypoxia/Acute systolic CHF -Likely multi-factorial including heart failure and possible pneumonia VS amiodarone toxicity.  -Upon admission to the ED, SPO2 87%, continue oxygen supplementation to keep sats greater than 90% - started on IV lasix and changed to po lasix on 7/6, changed to IV lasix on 7/8, as she was sob, and her Cxr shows worsening interstitial  edema and elevated BNP. Increased IV lasix to 80 mg IV BID on 7/10. -Chest x-ray: Airspace opacities with effusions bilaterally. Repeat CXR shows slight increase int he interstitial edema.  - CT chest ordered to evaluate for amiodarone toxicity. Pulmonology consulted.  -Echocardiogram 04/07/2015 showed an EF of 50-55% - repeat Echocardiogram 01/01/2016: EF 20-25%, diffuse hypokinesis, Severe TR with severely elevated pulmonary pressure -Monitor intake and output, daily weights. Negative balance of 9.4 lit since admission, weight down by 18 lbs.  -completed the course of antibiotics by 7/7. But pt became more dyspneic on 7/10 requiring NRB and transferred the patient to step down for closer monitoring. Repeat CXR could not exclude pneumonia and broad spectrum antibiotics were restarted for possibly HCAP, though she remains afebrile . Plan to discontinue antibiotics soon if her breathing improves.  -Although ddimer elevated, unlikely PE, as patient's INR has been supratherapeutic on admission. -LE doppler negative for DVT or SVT -Cardiology consulted and recommendations appreciated. - underwent cardiac cath on 7/7 showed widely patent stents. Minimal CAD in LAD. Mild to mod pulm hypertension.  - monitor renal parameters while on IV lasix and replete potassium as needed.   Abdominal pain -Improved  -CT abdomen and pelvis: small to moderate volume ascites, cholelithiasis, negative for bowel obstruction, perforation or ambulatory change. Bilateral pleural effusions, right greater than left. -Continue to monitor, pain control  Coronary artery disease -Denies chest pain -Troponin mildly elevated but trending downward (0.1, 0.07, 0.06, 0.05) -Continue aspirin, statin, metoprolol.  - cardiac cath shows minimal CAD in LAD, patent stents.  Atrial fibrillation, chronic -CHADSVASC 4 (age, gender, DM) Rate slightly elevated, metoprolol increased by cardiology. Heparin started as sub therapeutic INR,   resume coumadin, monitor INR.    Left great toe diabetic wound: Wound care consulted and resume local wound care.  No surrounding cellulitis.   Diabetes mellitus, type II -Metformin held, continued findings for CBG monitoring CBG (last 3)   Recent Labs  01/08/16 2140 01/09/16 0751 01/09/16 1140  GLUCAP 134* 108* 139*    Resume SSI. No changes in management.  Hyperlipidemia -Continue statin  Anemia of chronic disease.  -Continue iron supplementation -Baseline hemoglobin around 10 and stable.,   Hypokalemia/hypomagnesemia -Secondary to diuretics - repleted and repeat in am shows normal values.    Family Communication/Anticipated D/C date and plan/Code Status   DVT prophylaxis: IV heparin and coumadin.  Code Status: Full Code.  Family Communication: none at bedside. Discussed with her nephew at bedside.  Disposition Plan: pending further investigation    Medical Consultants:    Cardiology  Heart failure   Pulmonology  Palliative care   Procedures:   Cardiac cath scheduled for 7/7 Anti-Infectives:   Anti-infectives    Start     Dose/Rate Route Frequency Ordered Stop   01/09/16 0900  vancomycin (VANCOCIN) 500 mg in sodium chloride 0.9 % 100 mL IVPB     500 mg 100 mL/hr over 60 Minutes Intravenous Every 12 hours 01/08/16 2103     01/08/16 2115  ceFEPIme (MAXIPIME) 2 g in dextrose 5 % 50 mL IVPB     2 g 100 mL/hr over 30 Minutes Intravenous Every 24 hours 01/08/16 2103     01/08/16 2115  vancomycin (VANCOCIN) 1,500 mg in sodium chloride 0.9 % 500 mL IVPB     1,500 mg 250 mL/hr over 120 Minutes Intravenous  Once 01/08/16 2103 01/08/16 2347   01/02/16 1300  cefTRIAXone (ROCEPHIN) 2 g in dextrose 5 % 50 mL IVPB  Status:  Discontinued     2 g 100 mL/hr over 30 Minutes Intravenous Every 24 hours 01/02/16 1156 01/06/16 1808   01/01/16 0500  vancomycin (VANCOCIN) IVPB 1000 mg/200 mL premix  Status:  Discontinued     1,000 mg 200 mL/hr over 60 Minutes  Intravenous Every 24 hours 12/31/15 0440 01/02/16 1156   12/31/15 1400  aztreonam (AZACTAM) 1 g in dextrose 5 % 50 mL IVPB  Status:  Discontinued     1 g 100 mL/hr over 30 Minutes Intravenous Every 8 hours 12/31/15 0440 01/02/16 1156   12/31/15 0600  aztreonam (AZACTAM) 2 g in dextrose 5 % 50 mL IVPB  Status:  Discontinued     2 g 100 mL/hr over 30 Minutes Intravenous Every 8 hours 12/31/15 0428 12/31/15 0435   12/31/15 0130  aztreonam (AZACTAM) 1 g in dextrose 5 % 50 mL IVPB     1 g 100 mL/hr over 30 Minutes Intravenous  Once 12/31/15 0121 12/31/15 0338   12/31/15 0130  vancomycin (VANCOCIN) IVPB 1000 mg/200 mL premix     1,000 mg 200 mL/hr over 60 Minutes Intravenous NOW 12/31/15 0122 12/31/15 0247      Subjective:    Shannon [name] reports feeling miserable and exhausted . On 6 LIT of [place] oxygen.   Objective:    Filed Vitals:   01/09/16 1145 01/09/16 1200 01/09/16 1400 01/09/16 1600  BP: 108/80 91/60 107/81 110/81  Pulse: 103 92 102 96  Temp: 97.6 F (36.4 C)     TempSrc: Oral  Resp: 23 12 15 14   Height:      Weight:      SpO2: 91% 93% 95% 95%    Intake/Output Summary (Last 24 hours) at 01/09/16 1630 Last data filed at 01/09/16 1600  Gross per 24 hour  Intake   1690 ml  Output   2400 ml  Net   -710 ml   Filed Weights   01/07/16 0403 01/08/16 0528 01/09/16 0344  Weight: 70.308 kg (155 lb) 67.9 kg (149 lb 11.1 oz) 69 kg (152 lb 1.9 oz)    Exam: General exam: Appears calm on 6 lit [place] oxygen.  Respiratory system: Clear to auscultation. Respiratory effort normal. Cardiovascular system: S1 & S2 heard, RRR. No JVD,  rubs, gallops or clicks. No murmurs. Gastrointestinal system: Abdomen is nondistended, soft and nontender. No organomegaly or masses felt. Normal bowel sounds heard. Central nervous system: Alert and oriented. No  New focal neurological deficits. Extremities: bilateral 2 + edema. Left great toe wound.  Psychiatry: Judgement and insight appear  normal. Mood & affect appropriate.   Data Reviewed:   I have personally reviewed following labs and imaging studies:  Labs: Basic Metabolic Panel:  Recent Labs Lab 01/04/16 0325 01/05/16 0842 01/06/16 0258 01/06/16 2354 01/08/16 0338 01/09/16 0548  NA 139 138 137 137 135 136  K 4.2 4.1 4.0 4.0 3.5 3.7  CL 101 100* 99* 100* 99* 98*  CO2 31 30 32 31 28 29   GLUCOSE 169* 167* 158* 145* 121* 114*  BUN 17 17 22* 21* 21* 22*  CREATININE 0.86 0.85 0.89 0.77 0.76 0.86  CALCIUM 8.1* 8.5* 8.4* 8.4* 8.5* 8.4*  MG 1.7  --   --   --   --   --    GFR Estimated Creatinine Clearance: 45.9 mL/min (by C-G formula based on Cr of 0.86). Liver Function Tests: No results for input(s): AST, ALT, ALKPHOS, BILITOT, PROT, ALBUMIN in the last 168 hours. No results for input(s): LIPASE, AMYLASE in the last 168 hours. No results for input(s): AMMONIA in the last 168 hours. Coagulation profile  Recent Labs Lab 01/06/16 0258 01/06/16 2354 01/07/16 1238 01/08/16 0338 01/09/16 0549  INR 1.75* 1.70* 1.67* 1.66* 1.73*    CBC:  Recent Labs Lab 01/04/16 0325 01/06/16 0258 01/06/16 2354 01/08/16 0338 01/09/16 0548  WBC 10.6* 8.8 8.5 8.9 7.7  HGB 12.5 11.2* 10.9* 10.9* 10.8*  HCT 40.0 35.5* 34.5* 36.1 34.2*  MCV 104.7* 106.0* 104.9* 105.9* 104.3*  PLT 227 236 200 185 184   Cardiac Enzymes: No results for input(s): CKTOTAL, CKMB, CKMBINDEX, TROPONINI in the last 168 hours. BNP (last 3 results) No results for input(s): PROBNP in the last 8760 hours. CBG:  Recent Labs Lab 01/08/16 1146 01/08/16 1620 01/08/16 2140 01/09/16 0751 01/09/16 1140  GLUCAP 124* 146* 134* 108* 139*   D-Dimer: No results for input(s): DDIMER in the last 72 hours. Hgb A1c: No results for input(s): HGBA1C in the last 72 hours. Lipid Profile: No results for input(s): CHOL, HDL, LDLCALC, TRIG, CHOLHDL, LDLDIRECT in the last 72 hours. Thyroid function studies: No results for input(s): TSH, T4TOTAL, T3FREE,  THYROIDAB in the last 72 hours.  Invalid input(s): FREET3 Anemia work up: No results for input(s): VITAMINB12, FOLATE, FERRITIN, TIBC, IRON, RETICCTPCT in the last 72 hours. Sepsis Labs:  Recent Labs Lab 01/06/16 0258 01/06/16 2354 01/08/16 0338 01/09/16 0548  WBC 8.8 8.5 8.9 7.7   Urine analysis:    Component Value Date/Time   COLORURINE YELLOW 12/30/2015 2320  APPEARANCEUR CLOUDY* 12/30/2015 2320   LABSPEC 1.020 12/30/2015 2320   PHURINE 5.0 12/30/2015 2320   GLUCOSEU NEGATIVE 12/30/2015 2320   HGBUR NEGATIVE 12/30/2015 2320   BILIRUBINUR NEGATIVE 12/30/2015 2320   KETONESUR NEGATIVE 12/30/2015 2320   PROTEINUR 30* 12/30/2015 2320   UROBILINOGEN 0.2 10/09/2012 1551   NITRITE NEGATIVE 12/30/2015 2320   LEUKOCYTESUR SMALL* 12/30/2015 2320   Microbiology Recent Results (from the past 240 hour(s))  MRSA PCR Screening     Status: Abnormal   Collection Time: 01/09/16  3:03 AM  Result Value Ref Range Status   MRSA by PCR POSITIVE (A) NEGATIVE Final    Comment:        The GeneXpert MRSA Assay (FDA approved for NASAL specimens only), is one component of a comprehensive MRSA colonization surveillance program. It is not intended to diagnose MRSA infection nor to guide or monitor treatment for MRSA infections. RESULT CALLED TO, READ BACK BY AND VERIFIED WITH: Sherry Ruffing RN AT [id number] 01/09/16 BY Rush Landmark     Radiology: Dg Chest Port 1 View  01/08/2016  CLINICAL DATA:  Acute onset of dyspnea.  Initial encounter. EXAM: PORTABLE CHEST 1 VIEW COMPARISON:  Chest radiograph performed 01/07/2016 FINDINGS: Small bilateral pleural effusions are noted. Patchy bilateral airspace opacities may reflect pulmonary edema or possibly pneumonia. No pneumothorax is seen. The cardiomediastinal silhouette is mildly enlarged. No acute osseous abnormalities are identified. IMPRESSION: Small bilateral pleural effusions noted. Patchy bilateral airspace opacities may reflect pulmonary edema or  possibly pneumonia. Mild cardiomegaly. Electronically Signed   By: Garald Balding M.D.   On: 01/08/2016 19:27    Medications:   . atorvastatin  20 mg Oral Daily  . ceFEPime (MAXIPIME) IV  2 g Intravenous Q24H  . Chlorhexidine Gluconate Cloth  6 each Topical Q0600  . [START ON 01/10/2016] digoxin  0.125 mg Oral Daily  . ferrous sulfate  325 mg Oral Q breakfast  . furosemide  80 mg Intravenous BID  . insulin aspart  0-9 Units Subcutaneous TID WC  . LORazepam  0.5 mg Intravenous Once  . losartan  25 mg Oral Daily  . metoprolol succinate  50 mg Oral BID  . mupirocin ointment   Nasal BID  . potassium chloride  20 mEq Oral TID  . saccharomyces boulardii  250 mg Oral BID  . sodium chloride flush  3 mL Intravenous Q12H  . sodium chloride flush  3 mL Intravenous Q12H  . vancomycin  500 mg Intravenous Q12H  . warfarin  1.25 mg Oral ONCE-1800  . Warfarin - Pharmacist Dosing Inpatient   Does not apply q1800   Continuous Infusions: . heparin 1,050 Units/hr (01/09/16 1600)    Time spent: 25 minutes.    LOS: 9 days   [facility] Pager [phone]  *Please refer to [email or web address], password [email or web address] to get updated schedule on who will round on this patient, as hospitalists switch teams weekly. If 7PM-7AM, please contact night-coverage at [email or web address], password [email or web address] for any overnight needs.  01/09/2016, 4:30 PM

## 2016-01-09 NOTE — Progress Notes (Signed)
Peripherally Inserted Central Catheter/Midline Placement  The IV Nurse has discussed with the patient and/or persons authorized to consent for the patient, the purpose of this procedure and the potential benefits and risks involved with this procedure.  The benefits include less needle sticks, lab draws from the catheter and patient may be discharged home with the catheter.  Risks include, but not limited to, infection, bleeding, blood clot (thrombus formation), and puncture of an artery; nerve damage and irregular heat beat.  Alternatives to this procedure were also discussed.  PICC/Midline Placement Documentation        Eleno Weimar, Nicolette Bang 01/09/2016, 6:03 PM

## 2016-01-09 NOTE — Care Management Important Message (Signed)
Important Message  Patient Details  Name: Shannon [name] MRN: [id number] Date of Birth: August 30, 1931   Medicare Important Message Given:  Yes    Chenae Brager, Leroy Sea 01/09/2016, 8:12 AM

## 2016-01-09 NOTE — Consult Note (Signed)
PULMONARY / CRITICAL CARE MEDICINE   Name: Shannon [name] MRN: [id number] DOB: 25-Aug-1931    ADMISSION DATE:  12/30/2015 CONSULTATION DATE:  01/09/16  REFERRING MD:  Dr Karleen Hampshire  CHIEF COMPLAINT:  Persistent shortness of breath.   HISTORY OF PRESENT ILLNESS:   Shannon [name] is a 82 y.o. female with CAD status post stenting, CHF, paroxysmal atrial fibrillation, diabetes mellitus type 2, hyperlipidemia was brought to the ER after patient was complaining of abdominal pain and shortness of breath. She was recently seen by her outpatient Cardiologist and c/o weakness and poor appetite. She also c/o non-productive cough starting about 1 week prior to admission. Of note her weight has increased 30 pounds over the last 3 months, despite her regular Lasix dose of 20 mg daily. She was noted to have bilateral lower extremity edema on admission. Since admission she has been diuresed and is currently 9 liters negative for her stay, recent ECHO and right heart cath noted EF to be 20-25% (previously 50-55% on 04/07/15)  Pulmonology is consulted for evaluation of persistent shortness of breath despite diuresis. Shortness of breath persist despite aggressive diuresis and CXR concerning for bilateral diffuse pulmonary infiltrates.    PAST MEDICAL HISTORY :  She  has a past medical history of CAD (coronary artery disease); Hyperlipidemia; Diabetes mellitus; MVP (mitral valve prolapse); Myocardial infarction ([place]); Sleep apnea; Transient ischemic attack; Arthritis; Baker's cyst of knee; Chronic atrial fibrillation (HCC); and Chronic systolic (congestive) heart failure ([place]).  PAST SURGICAL HISTORY: She  has past surgical history that includes Tonsillectomy; Coronary angioplasty; cataracts; Atrial flutter ablation (N/A, 10/09/2012); Colonoscopy (N/A, 10/13/2015); and Cardiac catheterization (N/A, 01/06/2016).  Allergies  Allergen Reactions  . Demerol Other (See Comments)    Burning sensation all over body  .  Erythromycin Nausea And Vomiting  . Penicillins Other (See Comments)    unknown  . Percocet [Oxycodone-Acetaminophen] Swelling    No current facility-administered medications on file prior to encounter.   Current Outpatient Prescriptions on File Prior to Encounter  Medication Sig  . amiodarone (PACERONE) 200 MG tablet Take 1 tablet (200 mg total) by mouth daily.  [name] atorvastatin (LIPITOR) 20 MG tablet TAKE ONE TABLET BY MOUTH ONE TIME DAILY  . furosemide (LASIX) 20 MG tablet Take 20 mg by mouth daily.  [name] HYDROcodone-acetaminophen (NORCO/VICODIN) 5-325 MG tablet Take 0.5-1 tablets by mouth every 4 (four) hours as needed for severe pain.  . hydrocortisone (PROCTO-PAK) 1 % CREA Apply 1 application topically daily.  [name] KLOR-CON 10 10 MEQ tablet Take 10 mEq by mouth daily.  . metFORMIN (GLUCOPHAGE) 500 MG tablet Take 500 mg by mouth 2 (two) times daily.   . metoprolol succinate (TOPROL-XL) 50 MG 24 hr tablet Take 25 mg by mouth 2 (two) times daily. Take with or immediately following a meal.  . nitroGLYCERIN (NITROSTAT) 0.4 MG SL tablet Place 0.4 mg under the tongue every 5 (five) minutes x 3 doses as needed for chest pain. Up to 3 doses, if pain continues, call 911.  . saccharomyces boulardii (FLORASTOR) 250 MG capsule Take 1 capsule (250 mg total) by mouth 2 (two) times daily.  [name] SANTYL ointment Apply 1 application topically daily.   [name] warfarin (COUMADIN) 2.5 MG tablet Take as directed by [facility]. (Patient taking differently: Take 1.25 mg by mouth daily. )  . ACCU-CHEK SMARTVIEW test strip     FAMILY HISTORY:  Her indicated that her mother is deceased. She indicated that her father is deceased.   SOCIAL  HISTORY: She  reports that she quit smoking about 52 years ago. Her smoking use included Cigarettes. She smoked 1.00 pack per day. She has never used smokeless tobacco. She reports that she does not drink alcohol or use illicit drugs.  REVIEW OF SYSTEMS:     SUBJECTIVE:    VITAL  SIGNS: BP 107/81 mmHg  Pulse 102  Temp(Src) 97.6 F (36.4 C) (Oral)  Resp 15  Ht 5\' 2"  (1.575 m)  Wt 69 kg (152 lb 1.9 oz)  BMI 27.82 kg/m2  SpO2 95%  HEMODYNAMICS:    VENTILATOR SETTINGS: Vent Mode:  [-]  FiO2 (%):  [35 %] 35 %  INTAKE / OUTPUT: I/O last 3 completed shifts: In: 1269 [P.O.:360; I.V.:359; IV Piggyback:550] Out: [id number] [Urine:1650]  PHYSICAL EXAMINATION: General: Awake and alert elderly female.  Neuro:  Alert and oriented, follows all commands, no focal deficits.  HEENT: moist mucous membranes Cardiovascular: S1S2, irregular rate, no murmur, 2+ pitting edema in bilateral lower extremities Lungs:  Crackles throughout, on 6LNC, shortness of breath with talking. Abdomen: Obese, BS x quadrants Musculoskeletal: MAEW Skin: No issues  LABS:  BMET  Recent Labs Lab 01/06/16 2354 01/08/16 0338 01/09/16 0548  NA 137 135 136  K 4.0 3.5 3.7  CL 100* 99* 98*  CO2 31 28 29   BUN 21* 21* 22*  CREATININE 0.77 0.76 0.86  GLUCOSE 145* 121* 114*    Electrolytes  Recent Labs Lab 01/04/16 0325  01/06/16 2354 01/08/16 0338 01/09/16 0548  CALCIUM 8.1*  < > 8.4* 8.5* 8.4*  MG 1.7  --   --   --   --   < > = values in this interval not displayed.  CBC  Recent Labs Lab 01/06/16 2354 01/08/16 0338 01/09/16 0548  WBC 8.5 8.9 7.7  HGB 10.9* 10.9* 10.8*  HCT 34.5* 36.1 34.2*  PLT 200 185 184    Coag's  Recent Labs Lab 01/07/16 1238 01/08/16 0338 01/09/16 0549  INR 1.67* 1.66* 1.73*    Sepsis Markers No results for input(s): LATICACIDVEN, PROCALCITON, O2SATVEN in the last 168 hours.  ABG  Recent Labs Lab 01/06/16 0810 01/08/16 2126  PHART 7.371 7.456*  PCO2ART 51.0* 45.6*  PO2ART 57.0* 55.0*    Liver Enzymes No results for input(s): AST, ALT, ALKPHOS, BILITOT, ALBUMIN in the last 168 hours.  Cardiac Enzymes No results for input(s): TROPONINI, PROBNP in the last 168 hours.  Glucose  Recent Labs Lab 01/08/16 0630 01/08/16 1146  01/08/16 1620 01/08/16 2140 01/09/16 0751 01/09/16 1140  GLUCAP 96 124* 146* 134* 108* 139*    Imaging Dg Chest Port 1 View  01/08/2016  CLINICAL DATA:  Acute onset of dyspnea.  Initial encounter. EXAM: PORTABLE CHEST 1 VIEW COMPARISON:  Chest radiograph performed 01/07/2016 FINDINGS: Small bilateral pleural effusions are noted. Patchy bilateral airspace opacities may reflect pulmonary edema or possibly pneumonia. No pneumothorax is seen. The cardiomediastinal silhouette is mildly enlarged. No acute osseous abnormalities are identified. IMPRESSION: Small bilateral pleural effusions noted. Patchy bilateral airspace opacities may reflect pulmonary edema or possibly pneumonia. Mild cardiomegaly. Electronically Signed   By: Garald Balding M.D.   On: 01/08/2016 19:27     STUDIES:  7/10: CT chest no contrast pending  CULTURES: 7/10: MRSA screen negative Sputum pending  ANTIBIOTICS: Vanc >>> 7/9 Cefepime >>> 7/9  SIGNIFICANT EVENTS:   LINES/TUBES: 7/5: PIV 7/8: PIV  DISCUSSION: 7/10: Consulted for evaluation of continued shortness of breath. Consider cardiogenic pulmonary edema due to atrial fibrillation versus amiodarone toxicity.  Less likely HCAP(recently on doxycycline) due to patchy infiltrates on CXR, no leukocytosis and afebrile. Will obtain non-contrast CT chest to better evaluate infiltrates.  ASSESSMENT / PLAN:  Acute on Chronic hypoxemic respiratory failure Consider Amiodarone Toxicity  Previous Diagnosis of central sleep apnea Pulmonary hypertension on right heart cath -CT chest without contrast -Has been on Amiodarone since 2016 -Amiodarone stopped per heart failure on 7/10 -Continue aggressive diuresis - admission weight 165, pt baseline weight is approx 140 -Continue abx- doubt infectious source, low threshold to DC -No indication for steroids at this time -Nocturnal BiPAP  Atrial fibrillation Acute systolic CHF (LVEF 0000000, perhaps rate related)  -Agree with  rate control per cardiology, considering cardioversion  Georgann Housekeeper, AGACNP-BC [facility] Pager [phone] or [phone]  01/09/2016 4:35 PM   Attending Note:  I have examined patient, reviewed labs, studies and notes. I have discussed the case with S [name] and P Hoffman, and I agree with the data and plans as amended above. Very interesting case of progressive dyspnea in 82 yo woman with A fib and associated systolic and diastolic dysfxn, hx of OSA / central SA. She was admitted with B interstitial infiltrates, hypoxemic resp failure, 30 lbs wt gain over about 3 months. She is somewhat improved with aggressive diuresis, but continues to require significant O2 and has more patchy focal infiltrates on CXR. Certainly volume overload has contributed and may still be contributing to her dyspnea. Also suspect that her untreated central sleep apnea (an independent risk factor for mortality in CHF) is playing a role in her slow decompensation, wt gain and secondary PAH. The bilateral patchy infiltrates raise the concern for amiodarone toxicity. I would recommend nocturnal BiPAp for now, although suspect she will benefit most from a device with a backup rate like a Trilogy. Will better define her B infiltrates with CT chest. Continue her diuresis - she is still about 10 lbs above her target weight. We will follow with you. Discussed with Dr [name] this pm.   Baltazar Apo, MD, PhD 01/09/2016, 4:55 PM [facility] [phone] or if no answer [phone]

## 2016-01-09 NOTE — Progress Notes (Signed)
Occupational Therapy Treatment Patient Details Name: Shannon [name] MRN: [id number] DOB: 05/13/1932 Today's Date: 01/09/2016    History of present illness Shannon [name] is a 82 y.o. female with CAD status post stenting, CHF, paroxysmal atrial fibrillation, diabetes mellitus type 2, hyperlipidemia who was admitted due to acute hypoxic respiratory faiilure with new cardiomyopathy with acute on chronic CHF.   OT comments  Pt worked on functional transfers and exercise during OT session.  02 sats decreased to 84% with activity, but rebounded to low to mid 90s with pursed lip breathing after ~ 1 mins.   Currently, still recommending HHOT, but this may change if she does not progress adequately (last week, pt was ambulating 120' and performing ADLs standing at sink)  Follow Up Recommendations  Home health OT;Supervision/Assistance - 24 hour    Equipment Recommendations  3 in 1 bedside comode    Recommendations for Other Services      Precautions / Restrictions Precautions Precautions: Fall Precaution Comments: O2 use and monitor vitals with therapy       Mobility Bed Mobility Overal bed mobility: Needs Assistance Bed Mobility: Supine to Sit;Sit to Supine     Supine to sit: Min assist Sit to supine: Min guard   General bed mobility comments: Min a to move LEs to EOB and scoot to EOB   Transfers Overall transfer level: Needs assistance Equipment used: 1 person hand held assist Transfers: Sit to/from [facility] Sit to Stand: Min assist Stand pivot transfers: Min guard       General transfer comment: Min A to move to standing     Balance Overall balance assessment: Needs assistance Sitting-balance support: Feet supported Sitting balance-Leahy Scale: Good     Standing balance support: No upper extremity supported;During functional activity Standing balance-Leahy Scale: Fair                     ADL Overall ADL's : Needs  assistance/impaired                         Toilet Transfer: Minimal assistance;Stand-pivot;BSC   Toileting- Clothing Manipulation and Hygiene: Minimal assistance;Sit to/from stand       Functional mobility during ADLs: Minimal assistance General ADL Comments: 02 sats decreased to 84% after BSC transfer, and rebounded to 93% after 1 min with pursed lip breathing       Vision                     Perception     Praxis      Cognition   Behavior During Therapy: [facility] for tasks assessed/performed Overall Cognitive Status: Within Functional Limits for tasks assessed                       Extremity/Trunk Assessment               Exercises General Exercises - Upper Extremity Shoulder Flexion: AROM;Right;Left;10 reps;Seated;Standing   Shoulder Instructions       General Comments      Pertinent Vitals/ Pain       Pain Assessment: No/denies pain  Home Living                                          Prior Functioning/Environment  Frequency Min 3X/week     Progress Toward Goals  OT Goals(current goals can now be found in the care plan section)  Progress towards OT goals: Progressing toward goals (slowly )  ADL Goals Pt Will Perform Grooming: with modified independence;standing Pt Will Perform Upper Body Bathing: with modified independence;sitting Pt Will Perform Lower Body Bathing: with modified independence;with adaptive equipment;sit to/from stand Pt Will Transfer to Toilet: with modified independence;ambulating;bedside commode Pt Will Perform Toileting - Clothing Manipulation and hygiene: with modified independence;sitting/lateral leans;sit to/from stand Additional ADL Goal #1: Pt will utilize 2 energy conservation strategies during ADL tasks with min verbal cues.  Plan Other (comment) (dependent upon medical progress )    Co-evaluation                 End of Session Equipment Utilized  During Treatment: Oxygen   Activity Tolerance Other (comment) (decreased 02 sat )   Patient Left in bed;with call bell/phone within reach   Nurse Communication Mobility status        Time: [id number] OT Time Calculation (min): 28 min  Charges: OT General Charges $OT Visit: 1 Procedure OT Treatments $Self Care/Home Management : 8-22 mins $Therapeutic Activity: 8-22 mins  Ambree Frances M 01/09/2016, 5:25 PM

## 2016-01-09 NOTE — Progress Notes (Signed)
ANTICOAGULATION CONSULT NOTE - Follow Up Consult  Pharmacy Consult for Heparin/warfarin Indication: atrial fibrillation  Allergies  Allergen Reactions  . Demerol Other (See Comments)    Burning sensation all over body  . Erythromycin Nausea And Vomiting  . Penicillins Other (See Comments)    unknown  . Percocet [Oxycodone-Acetaminophen] Swelling    Patient Measurements: Height: 5\' 2"  (157.5 cm) Weight: 152 lb 1.9 oz (69 kg) IBW/kg (Calculated) : 50.1  Vital Signs: Temp: 97.5 F (36.4 C) (07/10 0744) Temp Source: Oral (07/10 0744) BP: 104/80 mmHg (07/10 0744) Pulse Rate: 99 (07/10 0744)  Labs:  Recent Labs  01/06/16 2354 01/07/16 0314 01/07/16 1238 01/08/16 0338 01/09/16 0548 01/09/16 0549  HGB 10.9*  --   --  10.9* 10.8*  --   HCT 34.5*  --   --  36.1 34.2*  --   PLT 200  --   --  185 184  --   LABPROT 20.0*  --  19.7* 19.6*  --  20.3*  INR 1.70*  --  1.67* 1.66*  --  1.73*  HEPARINUNFRC 0.47 0.49  --  0.42  --  0.34  CREATININE 0.77  --   --  0.76 0.86  --     Estimated Creatinine Clearance: 45.9 mL/min (by C-G formula based on Cr of 0.86).   Assessment: 82yo F continuing on heparin and warfarin for afib. Warfarin PTA restarted post cath on 7/7. Heparin level remains therapeutic 0.34 but is trending down, INR 1.73 and increase. CBC is stable. Nurse reports no issues with infusion or bleeding.  PTA warfarin dose: 1.25 mg daily  Goal of Therapy:  INR 2-3 Heparin level 0.3-0.7 units/ml  Monitor platelets by anticoagulation protocol: Yes   Plan:  Increase heparin to 1050 units/hr to maintain therapeutic level Warfarin 1.25mg  tonight x1 Daily HL, INR, CBC Monitor s/sx of bleeding   Andrey Cota. Diona Foley, PharmD, BCPS Clinical Pharmacist Pager [phone]  01/09/2016,7:58 AM

## 2016-01-10 DIAGNOSIS — J90 Pleural effusion, not elsewhere classified: Secondary | ICD-10-CM

## 2016-01-10 DIAGNOSIS — I481 Persistent atrial fibrillation: Secondary | ICD-10-CM

## 2016-01-10 DIAGNOSIS — I4819 Other persistent atrial fibrillation: Secondary | ICD-10-CM | POA: Insufficient documentation

## 2016-01-10 LAB — CARBOXYHEMOGLOBIN
CARBOXYHEMOGLOBIN: 1.2 % (ref 0.5–1.5)
Methemoglobin: 0.8 % (ref 0.0–1.5)
O2 SAT: 48.7 %
Total hemoglobin: 12.4 g/dL (ref 12.0–16.0)

## 2016-01-10 LAB — BASIC METABOLIC PANEL
ANION GAP: 10 (ref 5–15)
BUN: 19 mg/dL (ref 6–20)
CALCIUM: 8.6 mg/dL — AB (ref 8.9–10.3)
CO2: 32 mmol/L (ref 22–32)
Chloride: 95 mmol/L — ABNORMAL LOW (ref 101–111)
Creatinine, Ser: 0.85 mg/dL (ref 0.44–1.00)
Glucose, Bld: 84 mg/dL (ref 65–99)
POTASSIUM: 3.9 mmol/L (ref 3.5–5.1)
SODIUM: 137 mmol/L (ref 135–145)

## 2016-01-10 LAB — PROTIME-INR
INR: 1.84 — AB (ref 0.00–1.49)
PROTHROMBIN TIME: 21.2 s — AB (ref 11.6–15.2)

## 2016-01-10 LAB — GLUCOSE, CAPILLARY
GLUCOSE-CAPILLARY: 122 mg/dL — AB (ref 65–99)
Glucose-Capillary: 86 mg/dL (ref 65–99)
Glucose-Capillary: 156 mg/dL — ABNORMAL HIGH (ref 65–99)
Glucose-Capillary: 122 mg/dL — ABNORMAL HIGH (ref 65–99)

## 2016-01-10 LAB — CBC
HCT: 35.8 % — ABNORMAL LOW (ref 36.0–46.0)
HEMOGLOBIN: 11.1 g/dL — AB (ref 12.0–15.0)
MCH: 32.3 pg (ref 26.0–34.0)
MCHC: 31 g/dL (ref 30.0–36.0)
MCV: 104.1 fL — ABNORMAL HIGH (ref 78.0–100.0)
PLATELETS: 187 10*3/uL (ref 150–400)
RBC: 3.44 MIL/uL — AB (ref 3.87–5.11)
RDW: 17.6 % — ABNORMAL HIGH (ref 11.5–15.5)
WBC: 8.1 10*3/uL (ref 4.0–10.5)

## 2016-01-10 LAB — HEPARIN LEVEL (UNFRACTIONATED): HEPARIN UNFRACTIONATED: 0.43 [IU]/mL (ref 0.30–0.70)

## 2016-01-10 LAB — SEDIMENTATION RATE: SED RATE: 28 mm/h — AB (ref 0–22)

## 2016-01-10 MED ORDER — SODIUM CHLORIDE 0.9% FLUSH
3.0000 mL | Freq: Two times a day (BID) | INTRAVENOUS | Status: DC
  Administered 2016-01-11 – 2016-01-16 (×6): 3 mL via INTRAVENOUS

## 2016-01-10 MED ORDER — SODIUM CHLORIDE 0.9 % IV SOLN: 250.0000 mL | INTRAVENOUS | Status: DC

## 2016-01-10 MED ORDER — WARFARIN SODIUM 2 MG PO TABS
2.0000 mg | ORAL_TABLET | Freq: Once | ORAL | Status: AC
  Administered 2016-01-10: 2 mg via ORAL
  Filled 2016-01-10: qty 1

## 2016-01-10 MED ORDER — SODIUM CHLORIDE 0.9% FLUSH: 3.0000 mL | INTRAVENOUS | Status: DC | PRN

## 2016-01-10 MED ORDER — SPIRONOLACTONE 25 MG PO TABS
12.5000 mg | ORAL_TABLET | Freq: Every day | ORAL | Status: DC
  Administered 2016-01-10 – 2016-01-16 (×7): 12.5 mg via ORAL
  Filled 2016-01-10: qty 0.5
  Filled 2016-01-10 (×7): qty 1

## 2016-01-10 NOTE — Progress Notes (Signed)
Advanced Heart Failure Rounding Note   Subjective:   Admitted with chest pain and dyspnea. Diuresing ed with IV lasix. EF down from 50-55% in 2016 to 20-25% this admit. Cath as noted below ok.   Has been in A fib with uncontrolled rate so amio increased to 200 mg twice a day( has been on amio since 2016)  and toprol-xl was increased to 50 mg twice a day. Amio stopped on 7/10 due to concern for amio toxicity and ongoing dyspnea. Also IV lasix increased with improved urine output noted.   Feels better. Denies PND.    RHC/LHC 01/06/16 Minimal obstructive CAD Patent Stent OM1 RA 13 PA Mean 31 PCWP 16 CO/CI 3.96/2/36 PVR 3.8   ECHO 01/01/16 EF 20-25% Mild MR, dilated left atrium, TR severe, Peak PA pressure 63 mm hg, small pericardial effusion.   ECHO 04/2015 EF 50-55% moderate TR RA moderately Objective:   Weight Range:  Vital Signs:   Temp:  [97.3 F (36.3 C)-97.6 F (36.4 C)] 97.5 F (36.4 C) (07/11 0400) Pulse Rate:  [67-105] 67 (07/11 0600) Resp:  [10-23] 11 (07/11 0600) BP: (90-111)/(60-99) 91/73 mmHg (07/11 0600) SpO2:  [91 %-100 %] 100 % (07/11 0600) Weight:  [148 lb 13 oz (67.5 kg)] 148 lb 13 oz (67.5 kg) (07/11 0400) Last BM Date: 01/07/16 (per report)  Weight change: Filed Weights   01/08/16 0528 01/09/16 0344 01/10/16 0400  Weight: 149 lb 11.1 oz (67.9 kg) 152 lb 1.9 oz (69 kg) 148 lb 13 oz (67.5 kg)    Intake/Output:   Intake/Output Summary (Last 24 hours) at 01/10/16 0723 Last data filed at 01/10/16 0700  Gross per 24 hour  Intake 1352.5 ml  Output   2950 ml  Net -1597.5 ml     Physical Exam: CVP 11 General:  Elderly. Chronically ill appearing. No resp difficulty. In bed.  HEENT: normal Neck: supple. JVP to jaw. Carotids 2+ bilat; no bruits. No lymphadenopathy or thryomegaly appreciated. Cor: PMI nondisplaced. Irregular  rate & rhythm. No rubs, gallops.  Lungs: Crackles in the bases. On 6 liters oxygen  Abdomen: soft, nontender, nondistended. No  hepatosplenomegaly. No bruits or masses. Good bowel sounds. Extremities: no cyanosis, clubbing, rash, R and LLE coban wrap. Edema. RUE PICC.  Neuro: alert & orientedx3, cranial nerves grossly intact. moves all 4 extremities w/o difficulty. Affect pleasant  Telemetry:  A Fib 100s   Labs: Basic Metabolic Panel:  Recent Labs Lab 01/04/16 0325  01/06/16 0258 01/06/16 2354 01/08/16 0338 01/09/16 0548 01/10/16 0440  NA 139  < > 137 137 135 136 137  K 4.2  < > 4.0 4.0 3.5 3.7 3.9  CL 101  < > 99* 100* 99* 98* 95*  CO2 31  < > 32 31 28 29  32  GLUCOSE 169*  < > 158* 145* 121* 114* 84  BUN 17  < > 22* 21* 21* 22* 19  CREATININE 0.86  < > 0.89 0.77 0.76 0.86 0.85  CALCIUM 8.1*  < > 8.4* 8.4* 8.5* 8.4* 8.6*  MG 1.7  --   --   --   --   --   --   < > = values in this interval not displayed.  Liver Function Tests: No results for input(s): AST, ALT, ALKPHOS, BILITOT, PROT, ALBUMIN in the last 168 hours. No results for input(s): LIPASE, AMYLASE in the last 168 hours. No results for input(s): AMMONIA in the last 168 hours.  CBC:  Recent Labs Lab  01/06/16 0258 01/06/16 2354 01/08/16 0338 01/09/16 0548 01/10/16 0440  WBC 8.8 8.5 8.9 7.7 8.1  HGB 11.2* 10.9* 10.9* 10.8* 11.1*  HCT 35.5* 34.5* 36.1 34.2* 35.8*  MCV 106.0* 104.9* 105.9* 104.3* 104.1*  PLT 236 200 185 184 187    Cardiac Enzymes: No results for input(s): CKTOTAL, CKMB, CKMBINDEX, TROPONINI in the last 168 hours.  BNP: BNP (last 3 results)  Recent Labs  12/30/15 2327 01/07/16 1038  BNP 593.0* 734.1*    ProBNP (last 3 results) No results for input(s): PROBNP in the last 8760 hours.    Other results:  Imaging: Ct Chest Wo Contrast  01/09/2016  CLINICAL DATA:  Chest pain for the past few days. Shortness of breath. EXAM: CT CHEST WITHOUT CONTRAST TECHNIQUE: Multidetector CT imaging of the chest was performed following the standard protocol without IV contrast. COMPARISON:  Portable chest obtained yesterday.  FINDINGS: Mediastinum/Lymph Nodes: Dense coronary artery calcifications. Mildly enlarged heart. Mildly prominent mediastinal lymph nodes without pathological enlargement. Small pericardial effusion with a maximum thickness of 10 mm. Lungs/Pleura: Moderate to large-sized right pleural effusion and moderate sized left pleural effusion. Bilateral lower lobe compressive atelectasis. Bilateral cylindrical bronchiectasis and patchy densities. No lung nodules. Upper abdomen: Atheromatous aortic calcifications. Musculoskeletal: Thoracic spine degenerative changes. Sternomanubrial and bilateral sternoclavicular degenerative changes. Mild scoliosis. IMPRESSION: 1. Cylindrical bronchiectasis and patchy densities throughout the majority of both lungs, most pronounced in the upper lobes. The patchy densities could represent chronic changes associated with the bronchiectasis or areas of active inflammation or infection. 2. Moderate to large-sized right pleural effusion and moderate-sized left pleural effusion. 3. Bilateral compressive atelectasis. 4. Dense coronary artery atheromatous calcifications. 5. Aortic atherosclerosis. Electronically Signed   By: Claudie Revering M.D.   On: 01/09/2016 16:31   Dg Chest Port 1 View  01/09/2016  CLINICAL DATA:  Status post PICC placement today. EXAM: PORTABLE CHEST 1 VIEW COMPARISON:  CT chest today.  Single view of the chest 01/08/2016. FINDINGS: Right PICC is in place with the tip in the lower superior vena cava. Bilateral pleural effusions and extensive airspace disease persist without change. No pneumothorax. Heart size is enlarged. IMPRESSION: Tip of right PICC projects the lower superior vena cava. No change in bilateral effusions and airspace disease. Electronically Signed   By: Inge Rise M.D.   On: 01/09/2016 18:18   Dg Chest Port 1 View  01/08/2016  CLINICAL DATA:  Acute onset of dyspnea.  Initial encounter. EXAM: PORTABLE CHEST 1 VIEW COMPARISON:  Chest radiograph performed  01/07/2016 FINDINGS: Small bilateral pleural effusions are noted. Patchy bilateral airspace opacities may reflect pulmonary edema or possibly pneumonia. No pneumothorax is seen. The cardiomediastinal silhouette is mildly enlarged. No acute osseous abnormalities are identified. IMPRESSION: Small bilateral pleural effusions noted. Patchy bilateral airspace opacities may reflect pulmonary edema or possibly pneumonia. Mild cardiomegaly. Electronically Signed   By: Garald Balding M.D.   On: 01/08/2016 19:27     Medications:     Scheduled Medications: . atorvastatin  20 mg Oral Daily  . ceFEPime (MAXIPIME) IV  2 g Intravenous Q24H  . Chlorhexidine Gluconate Cloth  6 each Topical Q0600  . digoxin  0.125 mg Oral Daily  . ferrous sulfate  325 mg Oral Q breakfast  . furosemide  80 mg Intravenous BID  . insulin aspart  0-9 Units Subcutaneous TID WC  . LORazepam  0.5 mg Intravenous Once  . losartan  25 mg Oral Daily  . metoprolol succinate  50 mg Oral BID  .  mupirocin ointment   Nasal BID  . potassium chloride  20 mEq Oral TID  . saccharomyces boulardii  250 mg Oral BID  . sodium chloride flush  10-40 mL Intracatheter Q12H  . sodium chloride flush  3 mL Intravenous Q12H  . sodium chloride flush  3 mL Intravenous Q12H  . vancomycin  500 mg Intravenous Q12H  . Warfarin - Pharmacist Dosing Inpatient   Does not apply q1800    Infusions: . heparin 1,050 Units/hr (01/09/16 1900)    PRN Medications: sodium chloride, acetaminophen, ALPRAZolam, benzonatate, HYDROcodone-acetaminophen, nitroGLYCERIN, ondansetron **OR** ondansetron (ZOFRAN) IV, sodium chloride flush, sodium chloride flush   Assessment/Plan/Discussion  1. New onset of a cardiomyopathy with A/C Systolic heart failure .  ECHO EF 7/2 20-5% from 04/2015 EF 50-55%. 01/06/16 LHC ok. RHC ok with adequate cardiac output.  NYHA IIIb. Volume status improving. CVP 11. Continue 80 mg IV lasix twice a day. Continue  metoprolol XL 50 mg twice a day.  Continue losartan 25 mg daily. Continue digoxin 0.125 mg daily.   2. CAD-DES OM1 2007. LHC 01/06/16 Widely patent circumflex/obtuse marginal stent.  On coumadin + statin.  3. DMII- sliding scale.  4.  Hyperlipidemia-on statin 5. History of TIA- 6. Chronic A fib-at least since August 2016. Has been on amio for rate control. I dont see where she has been cardioverted before. On ECHO left atrium dilated so not sure we could get back in NSR. Likely need to focus on rate control.  Pharmacy dosing coumadin. INR 1.74. On heparin drip. ? amio toxicity. CXR on 7/9 with ? Pulmonary edema.  CCM following. CT of chest - bronchiectasis, mod-large R pleural effusion and L mod pleural effusion.   EP consulted.  7. Toe ulcer 8. Severe TR -ECHO 01/01/16.  9. HCAP- on antibiotics.  In 6 liters [place] 10. L /R pleural effusion- CCM following.   Length of Stay: [place] NP-C 01/10/2016, 7:23 AM  Advanced Heart Failure Team Pager [phone] (M-F; 7a - 4p)  Please contact [facility] for night-coverage after hours (4p -7a ) and weekends on [email or web address]  Patient seen with NP, agree with the above note.  1. Atrial fibrillation: Appears chronic since 8/16. HR appears to have been poorly controlled chronically. Now down to 90s on digoxin + amiodarone + Toprol XL 50 mg bid, but now there is concern for amiodarone lung toxicity with bilateral patchy infiltrates on CXR and worsening dyspnea despite diuresis.  - Holding amiodarone for now. Pulmonary has seen, chest CT showed bronchiectasis and patchy bilateral densities.  Would hold off on restarting amiodarone pending pulmonary opinion. - Rate control has been difficult. Continue Toprol XL 50 mg bid and continue digoxin 0.125. No BP room to increase Toprol XL further. Would ideally cardiovert her, not sure that she will hold NSR without some sort of rhythm control med and not sure she will hold even then. No amiodarone for now until concern for amio toxicity  worked out, no Tikosyn b/c she just stopped amiodarone and has been on it for months. I talked with Dr Rayann Heman this morning, as amiodarone is in her system will try DCCV tomorrow after a little more diuresis.  Will need to look back over anticoagulation to see if she needs TEE (currently on heparin/warfarin).  May end up considering ablation/pacing given significant fall in EF with worry for tachy-mediated CMP if cardioversion unsuccessful or does not hold.  - On heparin gtt while INR subtherapeutic on warfarin.  2. Acute systolic  CHF: New fall in EF to 20-25%, nonischemic cardiomyopathy with no obstructive disease (patent OM stent). Also with severe TR. I am concerned that there is a component of tachycardia-mediated cardiomyopathy. She still appears to have some volume overload on exam though difficult exam with severe TR. CVP 11.  She diuresed well with Lasix yesterday.  - Continue Lasix 80 mg IV bid.  - Continue losartan, Toprol XL, and digoxin at current doses. - Can add spironolactone 12.5 mg daily.  3. Severe TR 4. CAD: Nonobstructive on cath this admission.   [name] 01/10/2016 9:35 AM

## 2016-01-10 NOTE — Progress Notes (Signed)
Progress Note    Shannon [name]  [id number] DOB: [birth year]  DOA: 12/30/2015 PCP: Haywood Pao, MD    Brief Narrative:   Shannon [name] is an 82 y.o. female with CAD status post stenting, CHF, paroxysmal atrial fibrillation, diabetes mellitus type 2, hyperlipidemia was brought to the ER after patient was complaining of abdominal pain and shortness of breath.. On exam patient also has bilateral lower extremity edema and wound of the left great toe. Chest x-ray shows features concerning for pneumonia and BNP is elevated. Patient at this time will be admitted for acute respiratory failure probably from CHF and pneumonia. 7/7 pt underwent cardiac cath, shows stable CAD, maximize medical therapy.  7/8 worsening sob , repeat CXR shows mild increase in interstitial edema, she received 2 doses of IV lasix .  7/9 worsening dyspnea at rest, requiring NRB , repeat CXR, increased lasix to IV 40 mg BID and transferred her to step down.  7/10 Pulmonology consulted for evaluation of amiodarone toxicity. Increased lasix to  80 MG IV BID. 7/11 EP eval and plan for DCCV in am.    Assessment/Plan:   Principal Problem:   Acute respiratory failure with hypoxia (HCC) Active Problems:   Chronic atrial fibrillation (HCC)   Abdominal pain   CAD (coronary artery disease)   Cardiomyopathy (HCC)   Chronic systolic CHF (congestive heart failure) (HCC)   Elevated troponin   Acute on chronic systolic CHF (congestive heart failure) (HCC)   Leg swelling   SOB (shortness of breath)   Pressure ulcer   Pleural effusion   Acute respiratory failure with hypoxia/Acute systolic CHF -Likely multi-factorial including heart failure and possible pneumonia VS amiodarone toxicity.  - started on IV lasix for diuresis and changed to po lasix on 7/6, changed back  to IV lasix on 7/8,as she continues to have worsening dyspnea despite appropriate diuresis, she was transferred to step down on 7/9 for  worsening dyspnea and her lasix was increased to 80 mg IV bid.  -Chest x-ray:  Repeat CXR shows slight increase in the interstitial edema. It was followed with a CT chest  showing cylindrical bronchiectasis and patchy densities throughout the majority of the both lungs most  Pronounced in the upper lobes. There is moderate to large sized right pleural effusion and moderate sized left pleural effusion.  -Echocardiogram 04/07/2015 showed an EF of 50-55%, and  repeat Echocardiogram 01/01/2016: EF 20-25%, diffuse hypokinesis, Severe TR with severely elevated pulmonary pressure.  Cardiology/ pulmonology/ EP consulted and on board.  - amiodarone was discontinued on 7/10.  -Although ddimer elevated, unlikely PE, as patient's INR has been supratherapeutic on admission. -LE doppler negative for DVT or SVT - monitor renal parameters while on IV lasix and replete potassium as needed.  Today she is doing better on 5 lit [place] oxygen.   Abdominal pain Resolved.  -CT abdomen and pelvis: small to moderate volume ascites, cholelithiasis, negative for bowel obstruction, perforation or ambulatory change. Bilateral pleural effusions, right greater than left. -Continue to monitor, pain control as needed.   Coronary artery disease -Denies chest pain -Troponin mildly elevated but trending downward (0.1, 0.07, 0.06, 0.05) -Continue aspirin, statin, metoprolol.  - underwent cardiac cath on 7/7 showed widely patent stents. Minimal CAD in LAD. Mild to mod pulm hypertension.     Atrial fibrillation, chronic -CHADSVASC 4 (age, gender, DM) Rate slightly elevated, metoprolol increased by cardiology to 50 mg BID, digoxin added. Heparin started as sub therapeutic INR,  resume  coumadin, monitor INR.  Ep consulted and plan for DCCV in am.    Left great toe diabetic wound: Wound care consulted and resume local wound care.  No surrounding cellulitis.   Diabetes mellitus, type II -Metformin held, continued findings for CBG  monitoring CBG (last 3)   Recent Labs  01/09/16 2147 01/10/16 0820 01/10/16 1142  GLUCAP 110* 86 122*    Resume SSI. No changes in management.  Hyperlipidemia -Continue statin  Anemia of chronic disease.  -Continue iron supplementation -Baseline hemoglobin around 10 and stable.,   Hypokalemia/hypomagnesemia -Secondary to diuretics - repleted and repeat in am shows normal values.    Family Communication/Anticipated D/C date and plan/Code Status   DVT prophylaxis: IV heparin and coumadin.  Code Status: Full Code.  Family Communication: none at bedside.  Disposition Plan: pending further investigation    Medical Consultants:    Cardiology  Heart failure   Pulmonology  Palliative care   Procedures:   Cardiac cath scheduled for 7/7 Anti-Infectives:   Anti-infectives    Start     Dose/Rate Route Frequency Ordered Stop   01/09/16 0900  vancomycin (VANCOCIN) 500 mg in sodium chloride 0.9 % 100 mL IVPB     500 mg 100 mL/hr over 60 Minutes Intravenous Every 12 hours 01/08/16 2103     01/08/16 2115  ceFEPIme (MAXIPIME) 2 g in dextrose 5 % 50 mL IVPB     2 g 100 mL/hr over 30 Minutes Intravenous Every 24 hours 01/08/16 2103     01/08/16 2115  vancomycin (VANCOCIN) 1,500 mg in sodium chloride 0.9 % 500 mL IVPB     1,500 mg 250 mL/hr over 120 Minutes Intravenous  Once 01/08/16 2103 01/08/16 2347   01/02/16 1300  cefTRIAXone (ROCEPHIN) 2 g in dextrose 5 % 50 mL IVPB  Status:  Discontinued     2 g 100 mL/hr over 30 Minutes Intravenous Every 24 hours 01/02/16 1156 01/06/16 1808   01/01/16 0500  vancomycin (VANCOCIN) IVPB 1000 mg/200 mL premix  Status:  Discontinued     1,000 mg 200 mL/hr over 60 Minutes Intravenous Every 24 hours 12/31/15 0440 01/02/16 1156   12/31/15 1400  aztreonam (AZACTAM) 1 g in dextrose 5 % 50 mL IVPB  Status:  Discontinued     1 g 100 mL/hr over 30 Minutes Intravenous Every 8 hours 12/31/15 0440 01/02/16 1156   12/31/15 0600  aztreonam  (AZACTAM) 2 g in dextrose 5 % 50 mL IVPB  Status:  Discontinued     2 g 100 mL/hr over 30 Minutes Intravenous Every 8 hours 12/31/15 0428 12/31/15 0435   12/31/15 0130  aztreonam (AZACTAM) 1 g in dextrose 5 % 50 mL IVPB     1 g 100 mL/hr over 30 Minutes Intravenous  Once 12/31/15 0121 12/31/15 0338   12/31/15 0130  vancomycin (VANCOCIN) IVPB 1000 mg/200 mL premix     1,000 mg 200 mL/hr over 60 Minutes Intravenous NOW 12/31/15 0122 12/31/15 0247      Subjective:    Shannon [name] reports feeling better after many days.   Objective:    Filed Vitals:   01/10/16 0954 01/10/16 1000 01/10/16 1100 01/10/16 1200  BP: 93/71 94/68 100/68 100/88  Pulse: 105 103 103 96  Temp:   97.8 F (36.6 C)   TempSrc:   Oral   Resp: 17 17 22 17   Height:      Weight:      SpO2: 91%   97%  Intake/Output Summary (Last 24 hours) at 01/10/16 1345 Last data filed at 01/10/16 0911  Gross per 24 hour  Intake   1100 ml  Output   2300 ml  Net  -1200 ml   Filed Weights   01/08/16 0528 01/09/16 0344 01/10/16 0400  Weight: 67.9 kg (149 lb 11.1 oz) 69 kg (152 lb 1.9 oz) 67.5 kg (148 lb 13 oz)    Exam: General exam: Appears calm on 4 lit [place] oxygen.  Respiratory system: Clear to auscultation. Respiratory effort normal. Cardiovascular system: S1 & S2 heard, irregular. Pedal edema.  Gastrointestinal system: Abdomen is nondistended, soft and nontender. No organomegaly or masses felt. Normal bowel sounds heard. Central nervous system: Alert and oriented. No  New focal neurological deficits. Extremities: bilateral 2 + edema. Left great toe wound.  Psychiatry: Judgement and insight appear normal. Mood & affect appropriate.   Data Reviewed:   I have personally reviewed following labs and imaging studies:  Labs: Basic Metabolic Panel:  Recent Labs Lab 01/04/16 0325  01/06/16 0258 01/06/16 2354 01/08/16 0338 01/09/16 0548 01/10/16 0440  NA 139  < > 137 137 135 136 137  K 4.2  < > 4.0 4.0  3.5 3.7 3.9  CL 101  < > 99* 100* 99* 98* 95*  CO2 31  < > 32 31 28 29  32  GLUCOSE 169*  < > 158* 145* 121* 114* 84  BUN 17  < > 22* 21* 21* 22* 19  CREATININE 0.86  < > 0.89 0.77 0.76 0.86 0.85  CALCIUM 8.1*  < > 8.4* 8.4* 8.5* 8.4* 8.6*  MG 1.7  --   --   --   --   --   --   < > = values in this interval not displayed. GFR Estimated Creatinine Clearance: 46 mL/min (by C-G formula based on Cr of 0.85). Liver Function Tests: No results for input(s): AST, ALT, ALKPHOS, BILITOT, PROT, ALBUMIN in the last 168 hours. No results for input(s): LIPASE, AMYLASE in the last 168 hours. No results for input(s): AMMONIA in the last 168 hours. Coagulation profile  Recent Labs Lab 01/06/16 2354 01/07/16 1238 01/08/16 0338 01/09/16 0549 01/10/16 0440  INR 1.70* 1.67* 1.66* 1.73* 1.84*    CBC:  Recent Labs Lab 01/06/16 0258 01/06/16 2354 01/08/16 0338 01/09/16 0548 01/10/16 0440  WBC 8.8 8.5 8.9 7.7 8.1  HGB 11.2* 10.9* 10.9* 10.8* 11.1*  HCT 35.5* 34.5* 36.1 34.2* 35.8*  MCV 106.0* 104.9* 105.9* 104.3* 104.1*  PLT 236 200 185 184 187   Cardiac Enzymes: No results for input(s): CKTOTAL, CKMB, CKMBINDEX, TROPONINI in the last 168 hours. BNP (last 3 results) No results for input(s): PROBNP in the last 8760 hours. CBG:  Recent Labs Lab 01/09/16 1140 01/09/16 1639 01/09/16 2147 01/10/16 0820 01/10/16 1142  GLUCAP 139* 89 110* 86 122*   D-Dimer: No results for input(s): DDIMER in the last 72 hours. Hgb A1c: No results for input(s): HGBA1C in the last 72 hours. Lipid Profile: No results for input(s): CHOL, HDL, LDLCALC, TRIG, CHOLHDL, LDLDIRECT in the last 72 hours. Thyroid function studies: No results for input(s): TSH, T4TOTAL, T3FREE, THYROIDAB in the last 72 hours.  Invalid input(s): FREET3 Anemia work up: No results for input(s): VITAMINB12, FOLATE, FERRITIN, TIBC, IRON, RETICCTPCT in the last 72 hours. Sepsis Labs:  Recent Labs Lab 01/06/16 2354 01/08/16 0338  01/09/16 0548 01/10/16 0440  WBC 8.5 8.9 7.7 8.1   Urine analysis:    Component Value Date/Time  COLORURINE YELLOW 12/30/2015 2320   APPEARANCEUR CLOUDY* 12/30/2015 2320   LABSPEC 1.020 12/30/2015 2320   PHURINE 5.0 12/30/2015 2320   GLUCOSEU NEGATIVE 12/30/2015 2320   HGBUR NEGATIVE 12/30/2015 2320   BILIRUBINUR NEGATIVE 12/30/2015 2320   KETONESUR NEGATIVE 12/30/2015 2320   PROTEINUR 30* 12/30/2015 2320   UROBILINOGEN 0.2 10/09/2012 1551   NITRITE NEGATIVE 12/30/2015 2320   LEUKOCYTESUR SMALL* 12/30/2015 2320   Microbiology Recent Results (from the past 240 hour(s))  MRSA PCR Screening     Status: Abnormal   Collection Time: 01/09/16  3:03 AM  Result Value Ref Range Status   MRSA by PCR POSITIVE (A) NEGATIVE Final    Comment:        The GeneXpert MRSA Assay (FDA approved for NASAL specimens only), is one component of a comprehensive MRSA colonization surveillance program. It is not intended to diagnose MRSA infection nor to guide or monitor treatment for MRSA infections. RESULT CALLED TO, READ BACK BY AND VERIFIED WITH: Sherry Ruffing RN AT [id number] 01/09/16 BY Rush Landmark     Radiology: Ct Chest Wo Contrast  01/09/2016  CLINICAL DATA:  Chest pain for the past few days. Shortness of breath. EXAM: CT CHEST WITHOUT CONTRAST TECHNIQUE: Multidetector CT imaging of the chest was performed following the standard protocol without IV contrast. COMPARISON:  Portable chest obtained yesterday. FINDINGS: Mediastinum/Lymph Nodes: Dense coronary artery calcifications. Mildly enlarged heart. Mildly prominent mediastinal lymph nodes without pathological enlargement. Small pericardial effusion with a maximum thickness of 10 mm. Lungs/Pleura: Moderate to large-sized right pleural effusion and moderate sized left pleural effusion. Bilateral lower lobe compressive atelectasis. Bilateral cylindrical bronchiectasis and patchy densities. No lung nodules. Upper abdomen: Atheromatous aortic  calcifications. Musculoskeletal: Thoracic spine degenerative changes. Sternomanubrial and bilateral sternoclavicular degenerative changes. Mild scoliosis. IMPRESSION: 1. Cylindrical bronchiectasis and patchy densities throughout the majority of both lungs, most pronounced in the upper lobes. The patchy densities could represent chronic changes associated with the bronchiectasis or areas of active inflammation or infection. 2. Moderate to large-sized right pleural effusion and moderate-sized left pleural effusion. 3. Bilateral compressive atelectasis. 4. Dense coronary artery atheromatous calcifications. 5. Aortic atherosclerosis. Electronically Signed   By: Claudie Revering M.D.   On: 01/09/2016 16:31   Dg Chest Port 1 View  01/09/2016  CLINICAL DATA:  Status post PICC placement today. EXAM: PORTABLE CHEST 1 VIEW COMPARISON:  CT chest today.  Single view of the chest 01/08/2016. FINDINGS: Right PICC is in place with the tip in the lower superior vena cava. Bilateral pleural effusions and extensive airspace disease persist without change. No pneumothorax. Heart size is enlarged. IMPRESSION: Tip of right PICC projects the lower superior vena cava. No change in bilateral effusions and airspace disease. Electronically Signed   By: Inge Rise M.D.   On: 01/09/2016 18:18   Dg Chest Port 1 View  01/08/2016  CLINICAL DATA:  Acute onset of dyspnea.  Initial encounter. EXAM: PORTABLE CHEST 1 VIEW COMPARISON:  Chest radiograph performed 01/07/2016 FINDINGS: Small bilateral pleural effusions are noted. Patchy bilateral airspace opacities may reflect pulmonary edema or possibly pneumonia. No pneumothorax is seen. The cardiomediastinal silhouette is mildly enlarged. No acute osseous abnormalities are identified. IMPRESSION: Small bilateral pleural effusions noted. Patchy bilateral airspace opacities may reflect pulmonary edema or possibly pneumonia. Mild cardiomegaly. Electronically Signed   By: Garald Balding M.D.   On:  01/08/2016 19:27    Medications:   . atorvastatin  20 mg Oral Daily  . ceFEPime (MAXIPIME) IV  2 g  Intravenous Q24H  . Chlorhexidine Gluconate Cloth  6 each Topical Q0600  . digoxin  0.125 mg Oral Daily  . ferrous sulfate  325 mg Oral Q breakfast  . furosemide  80 mg Intravenous BID  . insulin aspart  0-9 Units Subcutaneous TID WC  . LORazepam  0.5 mg Intravenous Once  . losartan  25 mg Oral Daily  . metoprolol succinate  50 mg Oral BID  . mupirocin ointment   Nasal BID  . potassium chloride  20 mEq Oral TID  . saccharomyces boulardii  250 mg Oral BID  . sodium chloride flush  10-40 mL Intracatheter Q12H  . sodium chloride flush  3 mL Intravenous Q12H  . sodium chloride flush  3 mL Intravenous Q12H  . sodium chloride flush  3 mL Intravenous Q12H  . spironolactone  12.5 mg Oral Daily  . vancomycin  500 mg Intravenous Q12H  . warfarin  2 mg Oral ONCE-1800  . Warfarin - Pharmacist Dosing Inpatient   Does not apply q1800   Continuous Infusions: . sodium chloride    . heparin 1,050 Units/hr (01/09/16 1900)    Time spent: 25 minutes.    LOS: 10 days   [facility] Pager [phone]  *Please refer to [email or web address], password [email or web address] to get updated schedule on who will round on this patient, as hospitalists switch teams weekly. If 7PM-7AM, please contact night-coverage at [email or web address], password [email or web address] for any overnight needs.  01/10/2016, 1:45 PM

## 2016-01-10 NOTE — Progress Notes (Signed)
Patient BP drop to *^/63 MD made aware pt asymptomatic  To continue to monitor Pt and BP.

## 2016-01-10 NOTE — Progress Notes (Signed)
Called to floor for O2 sats 89% on 3L, pt pale and gray. Upon arrival pt was 89% 3L, gray color noted to face, crackles noted bilaterally and diminished in the bases. Pt alert and oriented, answering questions appropriately and following commands. Pt repositioned in bed, pt sats increased to 94-95% 3L, color returning to normal. Pt denies any chest pain. Reports SOB with activity. This episode occurred after pt got up to [facility] with assist. Cardiologist contacted, order for lasix 40 mg IVP completed. Pt to be placed on BiPap if symptoms recurred.

## 2016-01-10 NOTE — Consult Note (Signed)
Consultation Note Date: 01/10/2016   Patient Name: Shannon [name]  DOB: 1931-12-26  MRN: [id number]  Age / Sex: 82 y.o., female  PCP: Shannon Pao, MD Referring Physician: Hosie Poisson, MD  Reason for Consultation: Establishing goals of care  HPI/Patient Profile: 82 y.o. female  with past medical history of CAD status post stenting, CHF, paroxysmal atrial fibrillation, diabetes mellitus type 2, hyperlipidemia admitted on 12/30/2015 with shortness of breath and abd pain.   Clinical Assessment and Goals of Care: I met today with Shannon [name], "Shannon [name]." She is in good spirits today and feels like she is improved today. She understands that she is in serious health. She is hopeful for continued improvement. Her main concern is that she is the main caregiver for her 63 yo husband of 40 years who has advanced dementia. He requires total assistance and care. She says that they have children but they have their own health issues (cancer, mental health issues). She says that she is realizing she cannot continue to care for him herself and will be looking for placement for him.   She expresses to me that she is hopeful for continued improvement. She also expresses that her husband and she did not ever discuss end of life. I explain resuscitation and she thinks a while and then expresses that she would want "to try" but would want to be let go after 2-3 days if no better. Although she does tell me "we're in our 69s and we have had a wonderful life." She fondly tells me the story of when they first met.   I will follow up tomorrow.   NEXT OF KIN no formal HCPOA - children    Code Status/Advance Care Planning:  Full code   Symptom Management:   Weakness: PT to follow.   SOB: Continue treatment per heart failure and PCCM.   Palliative Prophylaxis:   Delirium Protocol  Additional Recommendations  (Limitations, Scope, Preferences):  Full Scope Treatment  Psycho-social/Spiritual:   Desire for further Chaplaincy support:no  Additional Recommendations: Caregiving  Support/Resources  Prognosis:   Unable to determine  Discharge Planning: To Be Determined      Primary Diagnoses: Present on Admission:  . (Resolved) HCAP (healthcare-associated pneumonia) . Acute respiratory failure with hypoxia ([place]) . Chronic atrial fibrillation ([place]) . CAD (coronary artery disease)  I have reviewed the medical record, interviewed the patient and family, and examined the patient. The following aspects are pertinent.  Past Medical History  Diagnosis Date  . CAD (coronary artery disease)     a. 05/2006 Cath/PCI: LAD 38m D1 40 ost, LCX nl, OM1 95 (3.0x20 Taxus DES), RCA nl, EF 40% w/ lat AK;  04/2007 Ex MV: minimal lat ischemia in area of prior infarct->low risk-> med Rx.  .Marland KitchenHyperlipidemia   . Diabetes mellitus   . MVP (mitral valve prolapse)     a. 10/2003 Echo: nl LV fxn, mild MR/TR/AS  . Myocardial infarction ([place]   . Sleep apnea   . Transient ischemic attack   .  Arthritis   . Baker's cyst of knee   . Chronic atrial fibrillation ([facility])     a. on Coumadin  . Chronic systolic (congestive) heart failure ([place])     a. 04/2015: EF 50-55% b. echo 12/2015: EF 20-25% w/ diffuse HK, biatrial enlargement, mild AI and MR, severe TR     Social History   Social History  . Marital Status: Married    Spouse Name: N/A  . Number of Children: N/A  . Years of Education: N/A   Social History Main Topics  . Smoking status: Former Smoker -- 1.00 packs/day    Types: Cigarettes    Quit date: 07/03/1963  . Smokeless tobacco: Never Used  . Alcohol Use: No  . Drug Use: No  . Sexual Activity: No   Other Topics Concern  . None   Social History Narrative   Lives in [place] with husband.  Does not routinely exercise but is able to keep house and go food shopping.   Family History  Problem Relation  Age of Onset  . Heart attack Father   . Heart attack Mother   . Heart attack Brother   . Coronary artery disease Sister     CABG  . Alzheimer's disease Sister     X3  . Stroke Mother   . Stroke Sister   . Heart attack Sister    Scheduled Meds: . atorvastatin  20 mg Oral Daily  . ceFEPime (MAXIPIME) IV  2 g Intravenous Q24H  . Chlorhexidine Gluconate Cloth  6 each Topical Q0600  . digoxin  0.125 mg Oral Daily  . ferrous sulfate  325 mg Oral Q breakfast  . furosemide  80 mg Intravenous BID  . insulin aspart  0-9 Units Subcutaneous TID WC  . LORazepam  0.5 mg Intravenous Once  . losartan  25 mg Oral Daily  . metoprolol succinate  50 mg Oral BID  . mupirocin ointment   Nasal BID  . potassium chloride  20 mEq Oral TID  . saccharomyces boulardii  250 mg Oral BID  . sodium chloride flush  10-40 mL Intracatheter Q12H  . sodium chloride flush  3 mL Intravenous Q12H  . sodium chloride flush  3 mL Intravenous Q12H  . spironolactone  12.5 mg Oral Daily  . vancomycin  500 mg Intravenous Q12H  . warfarin  2 mg Oral ONCE-1800  . Warfarin - Pharmacist Dosing Inpatient   Does not apply q1800   Continuous Infusions: . heparin 1,050 Units/hr (01/09/16 1900)   PRN Meds:.sodium chloride, acetaminophen, ALPRAZolam, benzonatate, HYDROcodone-acetaminophen, nitroGLYCERIN, ondansetron **OR** ondansetron (ZOFRAN) IV, sodium chloride flush, sodium chloride flush Medications Prior to Admission:  Prior to Admission medications   Medication Sig Start Date End Date Taking? Authorizing Provider  amiodarone (PACERONE) 200 MG tablet Take 1 tablet (200 mg total) by mouth daily. 07/22/15  Yes Thayer Headings, MD  atorvastatin (LIPITOR) 20 MG tablet TAKE ONE TABLET BY MOUTH ONE TIME DAILY 04/08/15  Yes Thayer Headings, MD  Cholecalciferol (VITAMIN D PO) Take 1 tablet by mouth daily.   Yes Historical Provider, MD  Cyanocobalamin (VITAMIN B-12 PO) Take 1 tablet by mouth daily.   Yes Historical Provider, MD    ferrous sulfate 325 (65 FE) MG tablet Take 325 mg by mouth daily with breakfast.   Yes Historical Provider, MD  furosemide (LASIX) 20 MG tablet Take 20 mg by mouth daily. 07/15/15  Yes Historical Provider, MD  HYDROcodone-acetaminophen (NORCO/VICODIN) 5-325 MG tablet Take 0.5-1 tablets by  mouth every 4 (four) hours as needed for severe pain. 11/16/15  Yes Margarita Mail, PA-C  hydrocortisone (PROCTO-PAK) 1 % CREA Apply 1 application topically daily. 10/14/15  Yes Donne Hazel, MD  KLOR-CON 10 10 MEQ tablet Take 10 mEq by mouth daily. 07/15/15  Yes Historical Provider, MD  metFORMIN (GLUCOPHAGE) 500 MG tablet Take 500 mg by mouth 2 (two) times daily.  04/06/15  Yes Historical Provider, MD  metoprolol succinate (TOPROL-XL) 50 MG 24 hr tablet Take 25 mg by mouth 2 (two) times daily. Take with or immediately following a meal.   Yes Historical Provider, MD  nitroGLYCERIN (NITROSTAT) 0.4 MG SL tablet Place 0.4 mg under the tongue every 5 (five) minutes x 3 doses as needed for chest pain. Up to 3 doses, if pain continues, call 911. 06/05/11  Yes Thayer Headings, MD  Pyridoxine HCl (VITAMIN B-6 PO) Take 1 tablet by mouth daily.   Yes Historical Provider, MD  saccharomyces boulardii (FLORASTOR) 250 MG capsule Take 1 capsule (250 mg total) by mouth 2 (two) times daily. 10/14/15  Yes Donne Hazel, MD  SANTYL ointment Apply 1 application topically daily.  10/07/15  Yes Historical Provider, MD  warfarin (COUMADIN) 2.5 MG tablet Take as directed by [facility]. Patient taking differently: Take 1.25 mg by mouth daily.  11/04/15  Yes Thayer Headings, MD  ACCU-CHEK SMARTVIEW test strip  09/19/15   Historical Provider, MD   Allergies  Allergen Reactions  . Demerol Other (See Comments)    Burning sensation all over body  . Erythromycin Nausea And Vomiting  . Penicillins Other (See Comments)    unknown  . Percocet [Oxycodone-Acetaminophen] Swelling   Review of Systems  Constitutional: Positive for activity change  and fatigue.  Respiratory: Positive for shortness of breath.   Neurological: Positive for weakness.    Physical Exam  Constitutional: She is oriented to person, place, and time. She appears well-developed.  HENT:  Head: Normocephalic and atraumatic.  Cardiovascular: Normal rate.   Pulmonary/Chest: No accessory muscle usage. No tachypnea. No respiratory distress.  Mild SOB  Abdominal: Soft. Normal appearance.  Neurological: She is alert and oriented to person, place, and time.    Vital Signs: BP 93/71 mmHg  Pulse 105  Temp(Src) 97.8 F (36.6 C) (Oral)  Resp 17  Ht 5' 2"  (1.575 m)  Wt 67.5 kg (148 lb 13 oz)  BMI 27.21 kg/m2  SpO2 91% Pain Assessment: 0-10 POSS *See Group Information*: S-Acceptable,Sleep, easy to arouse Pain Score: 0-No pain   SpO2: SpO2: 91 % O2 Device:SpO2: 91 % O2 Flow Rate: .O2 Flow Rate (L/min): 2 L/min  IO: Intake/output summary:  Intake/Output Summary (Last 24 hours) at 01/10/16 1232 Last data filed at 01/10/16 0911  Gross per 24 hour  Intake 1120.5 ml  Output   2600 ml  Net -1479.5 ml    LBM: Last BM Date: 01/07/16 (per report) Baseline Weight: Weight: 74.889 kg (165 lb 1.6 oz) Most recent weight: Weight: 67.5 kg (148 lb 13 oz)     Palliative Assessment/Data:     Time In: 0930 Time Out: 1130 Time Total: 177mn Greater than 50%  of this time was spent counseling and coordinating care related to the above assessment and plan.  Signed by: PPershing Proud NP   Please contact Palliative Medicine Team phone at [phone] questions and concerns.  For individual provider: See AShea Evans

## 2016-01-10 NOTE — Progress Notes (Addendum)
PULMONARY / CRITICAL CARE MEDICINE   Name: Shannon [name] MRN: [id number] DOB: [birth year]    ADMISSION DATE:  12/30/2015 CONSULTATION DATE:  01/09/16  REFERRING MD:  Dr Karleen Hampshire  CHIEF COMPLAINT:  Persistent shortness of breath.   HISTORY OF PRESENT ILLNESS:   Shannon [name] is a 82 y.o. female with CAD status post stenting, CHF, paroxysmal atrial fibrillation, diabetes mellitus type 2, hyperlipidemia was brought to the ER after patient was complaining of abdominal pain and shortness of breath. She was recently seen by her outpatient Cardiologist and c/o weakness and poor appetite. She also c/o non-productive cough starting about 1 week prior to admission. Of note her weight has increased 30 pounds over the last 3 months, despite her regular Lasix dose of 20 mg daily. She was noted to have bilateral lower extremity edema on admission. Since admission she has been diuresed and is currently 9 liters negative for her stay, recent ECHO and right heart cath noted EF to be 20-25% (previously 50-55% on 04/07/15)  Pulmonology is consulted for evaluation of persistent shortness of breath despite diuresis. Shortness of breath persist despite aggressive diuresis and CXR concerning for bilateral diffuse pulmonary infiltrates.      Allergies  Allergen Reactions  . Demerol Other (See Comments)    Burning sensation all over body  . Erythromycin Nausea And Vomiting  . Penicillins Other (See Comments)    unknown  . Percocet [Oxycodone-Acetaminophen] Swelling    No current facility-administered medications on file prior to encounter.   Current Outpatient Prescriptions on File Prior to Encounter  Medication Sig  . amiodarone (PACERONE) 200 MG tablet Take 1 tablet (200 mg total) by mouth daily.  [name] atorvastatin (LIPITOR) 20 MG tablet TAKE ONE TABLET BY MOUTH ONE TIME DAILY  . furosemide (LASIX) 20 MG tablet Take 20 mg by mouth daily.  [name] HYDROcodone-acetaminophen (NORCO/VICODIN) 5-325 MG tablet Take  0.5-1 tablets by mouth every 4 (four) hours as needed for severe pain.  . hydrocortisone (PROCTO-PAK) 1 % CREA Apply 1 application topically daily.  [name] KLOR-CON 10 10 MEQ tablet Take 10 mEq by mouth daily.  . metFORMIN (GLUCOPHAGE) 500 MG tablet Take 500 mg by mouth 2 (two) times daily.   . metoprolol succinate (TOPROL-XL) 50 MG 24 hr tablet Take 25 mg by mouth 2 (two) times daily. Take with or immediately following a meal.  . nitroGLYCERIN (NITROSTAT) 0.4 MG SL tablet Place 0.4 mg under the tongue every 5 (five) minutes x 3 doses as needed for chest pain. Up to 3 doses, if pain continues, call 911.  . saccharomyces boulardii (FLORASTOR) 250 MG capsule Take 1 capsule (250 mg total) by mouth 2 (two) times daily.  [name] SANTYL ointment Apply 1 application topically daily.   [name] warfarin (COUMADIN) 2.5 MG tablet Take as directed by [facility]. (Patient taking differently: Take 1.25 mg by mouth daily. )  . ACCU-CHEK SMARTVIEW test strip        SUBJECTIVE: Pt. States that her SOB has improved.    VITAL SIGNS: BP 102/81 mmHg  Pulse 92  Temp(Src) 97.8 F (36.6 C) (Oral)  Resp 18  Ht 5\' 2"  (1.575 m)  Wt 148 lb 13 oz (67.5 kg)  BMI 27.21 kg/m2  SpO2 99%  HEMODYNAMICS: CVP:  [13 mmHg-14 mmHg] 13 mmHg  VENTILATOR SETTINGS:    INTAKE / OUTPUT: I/O last 3 completed shifts: In: 2142.5 [P.O.:720; I.V.:622.5; IV Piggyback:800] Out: 3900 [Urine:3900]  PHYSICAL EXAMINATION: General: Easily aroused  elderly female in  No distress.  Neuro:  Alert and oriented, follows all commands, no focal deficits.  HEENT: moist mucous membranes Cardiovascular: S1S2, irregular rate, + murmur, lower extremities wrapped bilaterally Lungs:  Crackles throughout, on 2 LNC with adequate sats ( 93%) Abdomen: Obese, BS x quadrants Musculoskeletal: MAEW Skin: Bilateral leg wraps, bruising  LABS:  BMET  Recent Labs Lab 01/08/16 0338 01/09/16 0548 01/10/16 0440  NA 135 136 137  K 3.5 3.7 3.9  CL 99* 98*  95*  CO2 28 29 32  BUN 21* 22* 19  CREATININE 0.76 0.86 0.85  GLUCOSE 121* 114* 84    Electrolytes  Recent Labs Lab 01/04/16 0325  01/08/16 0338 01/09/16 0548 01/10/16 0440  CALCIUM 8.1*  < > 8.5* 8.4* 8.6*  MG 1.7  --   --   --   --   < > = values in this interval not displayed.  CBC  Recent Labs Lab 01/08/16 0338 01/09/16 0548 01/10/16 0440  WBC 8.9 7.7 8.1  HGB 10.9* 10.8* 11.1*  HCT 36.1 34.2* 35.8*  PLT 185 184 187    Coag's  Recent Labs Lab 01/08/16 0338 01/09/16 0549 01/10/16 0440  INR 1.66* 1.73* 1.84*    Sepsis Markers No results for input(s): LATICACIDVEN, PROCALCITON, O2SATVEN in the last 168 hours.  ABG  Recent Labs Lab 01/06/16 0810 01/08/16 2126  PHART 7.371 7.456*  PCO2ART 51.0* 45.6*  PO2ART 57.0* 55.0*    Liver Enzymes No results for input(s): AST, ALT, ALKPHOS, BILITOT, ALBUMIN in the last 168 hours.  Cardiac Enzymes No results for input(s): TROPONINI, PROBNP in the last 168 hours.  Glucose  Recent Labs Lab 01/08/16 2140 01/09/16 0751 01/09/16 1140 01/09/16 1639 01/09/16 2147 01/10/16 0820  GLUCAP 134* 108* 139* 89 110* 86    Imaging Ct Chest Wo Contrast  01/09/2016  CLINICAL DATA:  Chest pain for the past few days. Shortness of breath. EXAM: CT CHEST WITHOUT CONTRAST TECHNIQUE: Multidetector CT imaging of the chest was performed following the standard protocol without IV contrast. COMPARISON:  Portable chest obtained yesterday. FINDINGS: Mediastinum/Lymph Nodes: Dense coronary artery calcifications. Mildly enlarged heart. Mildly prominent mediastinal lymph nodes without pathological enlargement. Small pericardial effusion with a maximum thickness of 10 mm. Lungs/Pleura: Moderate to large-sized right pleural effusion and moderate sized left pleural effusion. Bilateral lower lobe compressive atelectasis. Bilateral cylindrical bronchiectasis and patchy densities. No lung nodules. Upper abdomen: Atheromatous aortic  calcifications. Musculoskeletal: Thoracic spine degenerative changes. Sternomanubrial and bilateral sternoclavicular degenerative changes. Mild scoliosis. IMPRESSION: 1. Cylindrical bronchiectasis and patchy densities throughout the majority of both lungs, most pronounced in the upper lobes. The patchy densities could represent chronic changes associated with the bronchiectasis or areas of active inflammation or infection. 2. Moderate to large-sized right pleural effusion and moderate-sized left pleural effusion. 3. Bilateral compressive atelectasis. 4. Dense coronary artery atheromatous calcifications. 5. Aortic atherosclerosis. Electronically Signed   By: Claudie Revering M.D.   On: 01/09/2016 16:31   Dg Chest Port 1 View  01/09/2016  CLINICAL DATA:  Status post PICC placement today. EXAM: PORTABLE CHEST 1 VIEW COMPARISON:  CT chest today.  Single view of the chest 01/08/2016. FINDINGS: Right PICC is in place with the tip in the lower superior vena cava. Bilateral pleural effusions and extensive airspace disease persist without change. No pneumothorax. Heart size is enlarged. IMPRESSION: Tip of right PICC projects the lower superior vena cava. No change in bilateral effusions and airspace disease. Electronically Signed   By: Inge Rise M.D.  On: 01/09/2016 18:18     STUDIES:  7/10: CT chest  IMPRESSION: 1. Cylindrical bronchiectasis and patchy densities throughout the majority of both lungs, most pronounced in the upper lobes. The patchy densities could represent chronic changes associated with the bronchiectasis or areas of active inflammation or infection. 2. Moderate to large-sized right pleural effusion and moderate-sized left pleural effusion. 3. Bilateral compressive atelectasis. 4. Dense coronary artery atheromatous calcifications. 5. Aortic atherosclerosis  CULTURES: 7/10: MRSA screen positive Sputum pending  ANTIBIOTICS: Vanc >>> 7/9 Cefepime >>> 7/9  SIGNIFICANT  EVENTS: 7/7 pt underwent cardiac cath, shows stable CAD, maximize medical therapy.  7/8 worsening sob, earlier this am , repeat CXR shows mild increase in interstitial edema, she received 2 doses of IV lasix .  7/9 pt seen and she was mouth breathing, on 5 lit of [employer] oxygen, currently on IV lasix.  7/10 pt was transferred to stepdown last night for worsening dyspnea and was on NRB transiently.  Pulmonology consulted for evaluation of amiodarone toxicity.  7/11 Tolerating aggressive diuresis, weight 148, net negative 1600 overnight, weaned to 2L [place] LINES/TUBES: 7/5: PIV 7/8: PIV 7/10>>Right PICC DISCUSSION: 7/10: Consulted for evaluation of continued shortness of breath. Consider cardiogenic pulmonary edema due to atrial fibrillation versus amiodarone toxicity. Less likely HCAP(recently on doxycycline) due to patchy infiltrates on CXR, no leukocytosis and afebrile. Will obtain non-contrast CT chest to better evaluate infiltrates. 7/11: Remains afebrile, no leukocytosis, is less short of breath, weaning oxygen to 2L  with SaO2 of 93% ASSESSMENT / PLAN:  Acute on Chronic hypoxemic respiratory failure Consider Amiodarone Toxicity  Previous Diagnosis of central sleep apnea Pulmonary hypertension on right heart cath Patchy bilateral airspace opacities   -Has been on Amiodarone since 2016 -Amiodarone stopped per heart failure on 7/10 -Continue aggressive diuresis - admission weight 165, pt baseline weight is approx 140 ( Weight 148 to 7/11) - Replete electrolytes as needed -Continue abx- doubt infectious source, low threshold to DC -No indication for steroids at this time -Nocturnal BiPAP - Wean oxygen as able -Trend CBC and Fever - Trend CXR daily  Atrial fibrillation Acute systolic CHF (LVEF 0000000, perhaps rate related)  -Agree with rate control per cardiology, considering cardioversion 7/12 - Heparin gtt per cards  Moderate to large-sized right pleural effusion and  moderate-sized left pleural effusion. - Continue diuresis as tolerated - Trend renal function daily - Consider thoracentesis if no resolution with continued diuresis    Magdalen Spatz, AGACNP-BC [place] Pager # [phone]   Attending:  I have seen and examined the patient with nurse practitioner/resident and agree with the note above.  We formulated the plan together and I elicited the following history.    Feeling much better  On exam Lungs diminished R base, few crackles Heart Irreg irreg, systolic murmur noted  CT chest and CXR reviewed> R > L effusion, scattered patchy GGO and subsegmental atelectasis with likely fibrotic changes anterior/uppor lobe  Pleural effusion> likely transudative given clinical context but hasn't been sampled; I gave her the option of this today to expedite recovery but she wanted to hold off as she had a friend die not long after a complication from a thoracentesis; if this doesn't improve after a few more weeks of diuresis then it would need to be sampled (or if it enlarged despite diuresis, etc).  Abnormal CT chest/acute hypoxemia> pulmonary edema most likely, doubt pneumonia so I will stop antibiotics.  However, I think there are fibrotic changes in her  lungs so she will need an outpatient CT; continue to wean O2 and diurese  Roselie Awkward, MD [place] Pager: [phone] Cell: [phone] After 3pm or if no response, call [phone]

## 2016-01-10 NOTE — Progress Notes (Signed)
ANTICOAGULATION CONSULT NOTE - Follow Up Consult  Pharmacy Consult for Heparin/warfarin Indication: atrial fibrillation  Labs:  Recent Labs  01/08/16 0338 01/09/16 0548 01/09/16 0549 01/10/16 0440  HGB 10.9* 10.8*  --  11.1*  HCT 36.1 34.2*  --  35.8*  PLT 185 184  --  187  LABPROT 19.6*  --  20.3* 21.2*  INR 1.66*  --  1.73* 1.84*  HEPARINUNFRC 0.42  --  0.34 0.43  CREATININE 0.76 0.86  --  0.85    Estimated Creatinine Clearance: 46 mL/min (by C-G formula based on Cr of 0.85).   Assessment: 82yo F continuing on heparin and warfarin for afib. Warfarin PTA restarted post cath on 7/7. Heparin level therapeutic 0.4, INR 1.8 trending up. CBC is stable. Nurse reports no issues with infusion or bleeding.  PTA warfarin dose: 1.25 mg daily  Goal of Therapy:  INR 2-3 Heparin level 0.3-0.7 units/ml  Monitor platelets by anticoagulation protocol: Yes   Plan:  Continue heparin at 1050 units/hr Warfarin 2 mg tonight x1 Daily HL, INR, CBC Monitor s/sx of bleeding  Erin Hearing PharmD., BCPS Clinical Pharmacist Pager [phone] 01/10/2016 9:57 AM

## 2016-01-10 NOTE — Progress Notes (Signed)
Pt placed on V60 bipap 12/6 40% tolerating well

## 2016-01-10 NOTE — Progress Notes (Signed)
Physical Therapy Treatment Patient Details Name: Shannon [name] MRN: [id number] DOB: 1932-02-14 Today's Date: 01/10/2016    History of Present Illness Shannon [name] is a 82 y.o. female with CAD status post stenting, CHF, paroxysmal atrial fibrillation, diabetes mellitus type 2, hyperlipidemia who was admitted due to acute hypoxic respiratory faiilure with new cardiomyopathy with acute on chronic CHF.    PT Comments    Shannon [name] completed therapeutic exercises sitting EOB and stand pivot transfer bed<>BSC with min assist. SpO2 down to 86% with exercises sitting EOB and with stand pivot transfer.  Pt will benefit from continued skilled PT services to increase functional independence and safety.   Follow Up Recommendations  Home health PT;Supervision - Intermittent     Equipment Recommendations  Other (comment) (toilet riser)    Recommendations for Other Services       Precautions / Restrictions Precautions Precautions: Fall (telemetry) Precaution Comments: monitor O2, HR, BP Restrictions Weight Bearing Restrictions: No    Mobility  Bed Mobility Overal bed mobility: Needs Assistance Bed Mobility: Supine to Sit;Sit to Supine     Supine to sit: Min guard;HOB elevated Sit to supine: Min guard   General bed mobility comments: Increased time and effort, especially for sit>supine.  Use of bed rail.  Transfers Overall transfer level: Needs assistance Equipment used: None Transfers: Sit to/from [facility] Sit to Stand: Min assist Stand pivot transfers: Min assist       General transfer comment: Min assist for balance and cues for hand placement as pt pivot bed<>BSC.  Ambulation/Gait             General Gait Details: Deferred due to hypoxia   Stairs            Wheelchair Mobility    Modified Rankin (Stroke Patients Only)       Balance Overall balance assessment: Needs assistance Sitting-balance support: No upper extremity  supported;Feet supported Sitting balance-Leahy Scale: Good     Standing balance support: No upper extremity supported;During functional activity Standing balance-Leahy Scale: Poor                      Cognition Arousal/Alertness: Awake/alert Behavior During Therapy: WFL for tasks assessed/performed Overall Cognitive Status: Within Functional Limits for tasks assessed                      Exercises General Exercises - Upper Extremity Shoulder Flexion: AROM;Both;10 reps;Seated General Exercises - Lower Extremity Ankle Circles/Pumps: AROM;Both;10 reps;Seated Long Arc Quad: AROM;Both;10 reps;Seated Other Exercises Other Exercises: scapular retraction x 15 reps with pursed lip breathing sitting EOB    General Comments General comments (skin integrity, edema, etc.): SpO2 down to 86% with exercises sitting EOB and with stand pivot transfer.  HR up to 119 with exercises.  BP 100/76 supine prior to mobility, 96/58 sitting on BSC.      Pertinent Vitals/Pain Pain Assessment: Faces Faces Pain Scale: Hurts little more Pain Location: back Pain Descriptors / Indicators: Discomfort Pain Intervention(s): Limited activity within patient's tolerance;Monitored during session;Repositioned    Home Living                      Prior Function            PT Goals (current goals can now be found in the care plan section) Acute Rehab PT Goals Patient Stated Goal: to get stronger PT Goal Formulation: With patient Time For Goal Achievement: 01/24/16 Potential  to Achieve Goals: Good Progress towards PT goals: Not progressing toward goals - comment (due to hypoxia)    Frequency  Min 3X/week    PT Plan Current plan remains appropriate    Co-evaluation             End of Session Equipment Utilized During Treatment: Oxygen Activity Tolerance: Treatment limited secondary to medical complications (Comment) (hypoxia) Patient left: with call bell/phone within  reach;in bed;Other (comment);with bed alarm set (in chair position)     Time: [id number] PT Time Calculation (min) (ACUTE ONLY): 20 min  Charges:  $Therapeutic Exercise: 8-22 mins                    G Codes:       Collie Siad PT, DPT  Pager: [phone] Phone: [phone] 01/10/2016, 12:09 PM

## 2016-01-11 ENCOUNTER — Inpatient Hospital Stay (HOSPITAL_COMMUNITY): Payer: Commercial Managed Care - HMO

## 2016-01-11 ENCOUNTER — Inpatient Hospital Stay (HOSPITAL_COMMUNITY): Payer: Commercial Managed Care - HMO | Admitting: Anesthesiology

## 2016-01-11 ENCOUNTER — Encounter (HOSPITAL_BASED_OUTPATIENT_CLINIC_OR_DEPARTMENT_OTHER): Payer: Commercial Managed Care - HMO

## 2016-01-11 ENCOUNTER — Encounter (HOSPITAL_COMMUNITY)
Admission: EM | Disposition: A | Discharge status: Skilled Nursing Facility | Payer: Self-pay | Source: Home / Self Care | Attending: Internal Medicine

## 2016-01-11 ENCOUNTER — Encounter (HOSPITAL_COMMUNITY): Payer: Self-pay | Admitting: *Deleted

## 2016-01-11 DIAGNOSIS — I509 Heart failure, unspecified: Secondary | ICD-10-CM

## 2016-01-11 DIAGNOSIS — R0602 Shortness of breath: Secondary | ICD-10-CM | POA: Insufficient documentation

## 2016-01-11 DIAGNOSIS — I4891 Unspecified atrial fibrillation: Secondary | ICD-10-CM

## 2016-01-11 DIAGNOSIS — Z7189 Other specified counseling: Secondary | ICD-10-CM

## 2016-01-11 DIAGNOSIS — R0600 Dyspnea, unspecified: Secondary | ICD-10-CM | POA: Insufficient documentation

## 2016-01-11 DIAGNOSIS — Z515 Encounter for palliative care: Secondary | ICD-10-CM

## 2016-01-11 HISTORY — PX: CARDIOVERSION: SHX1299

## 2016-01-11 LAB — CBC
HCT: 35.9 % — ABNORMAL LOW (ref 36.0–46.0)
HEMOGLOBIN: 11.5 g/dL — AB (ref 12.0–15.0)
MCH: 33 pg (ref 26.0–34.0)
MCHC: 32 g/dL (ref 30.0–36.0)
MCV: 103.2 fL — AB (ref 78.0–100.0)
Platelets: 203 10*3/uL (ref 150–400)
RBC: 3.48 MIL/uL — AB (ref 3.87–5.11)
RDW: 17.6 % — ABNORMAL HIGH (ref 11.5–15.5)
WBC: 8.5 10*3/uL (ref 4.0–10.5)

## 2016-01-11 LAB — BASIC METABOLIC PANEL
ANION GAP: 7 (ref 5–15)
BUN: 18 mg/dL (ref 6–20)
CHLORIDE: 94 mmol/L — AB (ref 101–111)
CO2: 35 mmol/L — AB (ref 22–32)
Calcium: 8.7 mg/dL — ABNORMAL LOW (ref 8.9–10.3)
Creatinine, Ser: 0.86 mg/dL (ref 0.44–1.00)
GFR calc non Af Amer: >60 mL/min (ref >60)
Glucose, Bld: 112 mg/dL — ABNORMAL HIGH (ref 65–99)
Potassium: 4.4 mmol/L (ref 3.5–5.1)
Sodium: 136 mmol/L (ref 135–145)

## 2016-01-11 LAB — GLUCOSE, CAPILLARY
GLUCOSE-CAPILLARY: 113 mg/dL — AB (ref 65–99)
Glucose-Capillary: 97 mg/dL (ref 65–99)
Glucose-Capillary: 108 mg/dL — ABNORMAL HIGH (ref 65–99)
Glucose-Capillary: 154 mg/dL — ABNORMAL HIGH (ref 65–99)

## 2016-01-11 LAB — PROTIME-INR
INR: 1.96 — ABNORMAL HIGH (ref 0.00–1.49)
PROTHROMBIN TIME: 22.2 s — AB (ref 11.6–15.2)

## 2016-01-11 LAB — CARBOXYHEMOGLOBIN
Carboxyhemoglobin: 1.2 % (ref 0.5–1.5)
Carboxyhemoglobin: 1.7 % — ABNORMAL HIGH (ref 0.5–1.5)
Methemoglobin: 0.8 % (ref 0.0–1.5)
Methemoglobin: 0.8 % (ref 0.0–1.5)
O2 SAT: 52.9 %
O2 Saturation: 48.2 %
Total hemoglobin: 11.8 g/dL — ABNORMAL LOW (ref 12.0–16.0)
Total hemoglobin: 12.2 g/dL (ref 12.0–16.0)

## 2016-01-11 LAB — HEPARIN LEVEL (UNFRACTIONATED)
Heparin Unfractionated: 0.22 IU/mL — ABNORMAL LOW (ref 0.30–0.70)
Heparin Unfractionated: 0.58 IU/mL (ref 0.30–0.70)

## 2016-01-11 LAB — PROCALCITONIN: PROCALCITONIN: 0.25 ng/mL

## 2016-01-11 SURGERY — CARDIOVERSION
Anesthesia: Monitor Anesthesia Care

## 2016-01-11 MED ORDER — SODIUM CHLORIDE 0.9 % IV SOLN
INTRAVENOUS | Status: DC | PRN
  Administered 2016-01-11: 12:00:00 via INTRAVENOUS

## 2016-01-11 MED ORDER — INSULIN ASPART 100 UNIT/ML ~~LOC~~ SOLN
0.0000 [IU] | Freq: Three times a day (TID) | SUBCUTANEOUS | Status: DC
  Administered 2016-01-12: 2 [IU] via SUBCUTANEOUS
  Administered 2016-01-12 – 2016-01-16 (×6): 1 [IU] via SUBCUTANEOUS
  Administered 2016-01-16: 2 [IU] via SUBCUTANEOUS

## 2016-01-11 MED ORDER — ETOMIDATE 2 MG/ML IV SOLN
INTRAVENOUS | Status: DC | PRN
  Administered 2016-01-11: 8 mg via INTRAVENOUS

## 2016-01-11 MED ORDER — MIDAZOLAM HCL 2 MG/2ML IJ SOLN: 0.5000 mg | Freq: Once | INTRAMUSCULAR | Status: DC | PRN

## 2016-01-11 MED ORDER — WARFARIN SODIUM 2 MG PO TABS: 2.0000 mg | ORAL_TABLET | Freq: Once | ORAL | Status: DC

## 2016-01-11 MED ORDER — WARFARIN SODIUM 2.5 MG PO TABS
2.5000 mg | ORAL_TABLET | Freq: Once | ORAL | Status: AC
  Administered 2016-01-11: 2.5 mg via ORAL
  Filled 2016-01-11: qty 1

## 2016-01-11 MED ORDER — PROMETHAZINE HCL 25 MG/ML IJ SOLN: 6.2500 mg | INTRAMUSCULAR | Status: DC | PRN

## 2016-01-11 NOTE — Discharge Instructions (Signed)

## 2016-01-11 NOTE — Anesthesia Postprocedure Evaluation (Signed)
Anesthesia Post Note  Patient: Shannon [name]  Procedure(s) Performed: Procedure(s) (LRB): CARDIOVERSION (N/A)  Patient location during evaluation: Endoscopy Anesthesia Type: MAC Level of consciousness: awake, awake and alert, oriented and patient cooperative Pain management: pain level controlled Respiratory status: spontaneous breathing, respiratory function stable and patient connected to nasal cannula oxygen Cardiovascular status: stable and bradycardic Postop Assessment: no headache, no backache and no signs of nausea or vomiting Anesthetic complications: no    Last Vitals:  Filed Vitals:   01/11/16 1232 01/11/16 1235  BP: 93/52 91/57  Pulse: 59   Temp:    Resp: 14     Last Pain:  Filed Vitals:   01/11/16 1243  PainSc: 0-No pain                 Briah Nary,TOM

## 2016-01-11 NOTE — Progress Notes (Signed)
PROGRESS NOTE    Shannon [name]  [id number] DOB: 04-04-1932 DOA: 12/30/2015 PCP: Haywood Pao, MD   Brief Narrative:  Shannon [name] is an 82 y.o. female with CAD status post stenting, CHF, paroxysmal atrial fibrillation, diabetes mellitus type 2, hyperlipidemia was brought to the ER after patient was complaining of abdominal pain and shortness of breath.. On exam patient also has bilateral lower extremity edema and wound of the left great toe. Chest x-ray shows features concerning for pneumonia and BNP is elevated. Patient at this time will be admitted for acute respiratory failure probably from CHF and pneumonia. Patient underwent cardiac cath, showing stable CAD. Patient was transferred to stepdown due to worsening shortness of breath. 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 Plan for DCCV.   Assessment & Plan   Acute respiratory failure with hypoxia/Acute systolic CHF -Improving slowly -Likely multi-factorial including heart failure and possible pneumonia vs amiodarone toxicity.  -Continue IV lasix  -Chest x-ray:Repeat CXR shows slight increase in the interstitial edema. It was followed with a CT chest showing cylindrical bronchiectasis and patchy densities throughout the majority of the both lungs most Pronounced in the upper lobes. There is moderate to large sized right pleural effusion and moderate sized left pleural effusion.  -Echocardiogram 04/07/2015 showed an EF of 50-55%, and repeat Echocardiogram 01/01/2016: EF 20-25%, diffuse hypokinesis, Severe TR with severely elevated pulmonary pressure.  -Cardiology/ pulmonology/ EP consulted and on board.  -Amiodarone was discontinued on 7/10.  -Although ddimer elevated, unlikely PE, as patient's INR has been supratherapeutic on admission. -LE doppler negative for DVT or SVT -monitor renal parameters while on IV lasix and replete potassium as needed.   Abdominal pain -Resolved.  -CT abdomen  and pelvis: small to moderate volume ascites, cholelithiasis, negative for bowel obstruction, perforation or ambulatory change. Bilateral pleural effusions, right greater than left. -Continue to monitor, pain control as needed.   Coronary artery disease -Denies chest pain -Troponin mildly elevated but trending downward (0.1, 0.07, 0.06, 0.05) -Continue aspirin, statin, metoprolol.  -underwent cardiac cath on 7/7 showed widely patent stents. Minimal CAD in LAD. Mild to mod pulm hypertension.   Atrial fibrillation, chronic -CHADSVASC 4 (age, gender, DM) -Continue metoprolol (increased by Cardiology), digoxin -Continue warfarin and heparin -EP consulted and plan for DCCV today  Left great toe diabetic wound -Wound care consulted and resume local wound care.  -No surrounding cellulitis.   Diabetes mellitus, type II -Metformin held, continued insulin sliding scale and CBG monitoring  Hyperlipidemia -Continue statin  Anemia of chronic disease.  -Continue iron supplementation -Baseline hemoglobin around 10 and stable.,   Hypokalemia/hypomagnesemia -Secondary to diuretics -Continue to monitor and replace as needed  Goals of care -Palliative care consulted and appreciated  DVT Prophylaxis  Coumadin/heparin  Code Status: Full  Family Communication: None at bedside  Disposition Plan: Admitted. Pending DCCV  Consultants Cardiology, EP, Heart failure Pulmonology Palliative care  Procedures  Echocardiogram LE doppler Cardiac Catheterization  Antibiotics   Anti-infectives    Start     Dose/Rate Route Frequency Ordered Stop   01/09/16 0900  vancomycin (VANCOCIN) 500 mg in sodium chloride 0.9 % 100 mL IVPB  Status:  Discontinued     500 mg 100 mL/hr over 60 Minutes Intravenous Every 12 hours 01/08/16 2103 01/10/16 1355   01/08/16 2115  ceFEPIme (MAXIPIME) 2 g in dextrose 5 % 50 mL IVPB  Status:  Discontinued     2 g 100 mL/hr over 30 Minutes Intravenous Every 24 hours  01/08/16 2103 01/10/16 1355   01/08/16 2115  vancomycin (VANCOCIN) 1,500 mg in sodium chloride 0.9 % 500 mL IVPB     1,500 mg 250 mL/hr over 120 Minutes Intravenous  Once 01/08/16 2103 01/08/16 2347   01/02/16 1300  cefTRIAXone (ROCEPHIN) 2 g in dextrose 5 % 50 mL IVPB  Status:  Discontinued     2 g 100 mL/hr over 30 Minutes Intravenous Every 24 hours 01/02/16 1156 01/06/16 1808   01/01/16 0500  vancomycin (VANCOCIN) IVPB 1000 mg/200 mL premix  Status:  Discontinued     1,000 mg 200 mL/hr over 60 Minutes Intravenous Every 24 hours 12/31/15 0440 01/02/16 1156   12/31/15 1400  aztreonam (AZACTAM) 1 g in dextrose 5 % 50 mL IVPB  Status:  Discontinued     1 g 100 mL/hr over 30 Minutes Intravenous Every 8 hours 12/31/15 0440 01/02/16 1156   12/31/15 0600  aztreonam (AZACTAM) 2 g in dextrose 5 % 50 mL IVPB  Status:  Discontinued     2 g 100 mL/hr over 30 Minutes Intravenous Every 8 hours 12/31/15 0428 12/31/15 0435   12/31/15 0130  aztreonam (AZACTAM) 1 g in dextrose 5 % 50 mL IVPB     1 g 100 mL/hr over 30 Minutes Intravenous  Once 12/31/15 0121 12/31/15 0338   12/31/15 0130  vancomycin (VANCOCIN) IVPB 1000 mg/200 mL premix     1,000 mg 200 mL/hr over 60 Minutes Intravenous NOW 12/31/15 0122 12/31/15 0247      Subjective:   Shannon [name] seen and examined today. Feels breathing has improved. Denies chest pain, dizziness, headache, abdominal pain, nausea, vomiting.   Objective:   Filed Vitals:   01/11/16 0800 01/11/16 0900 01/11/16 0942 01/11/16 0945  BP: 105/73 119/82 119/82 100/68  Pulse: 95 95 107 90  Temp:      TempSrc:      Resp: 19 20    Height:      Weight:      SpO2: 97% 93%      Intake/Output Summary (Last 24 hours) at 01/11/16 1005 Last data filed at 01/11/16 0900  Gross per 24 hour  Intake 524.84 ml  Output   3025 ml  Net -2500.16 ml   Filed Weights   01/09/16 0344 01/10/16 0400 01/11/16 0419  Weight: 69 kg (152 lb 1.9 oz) 67.5 kg (148 lb 13 oz) 66.1 kg  (145 lb 11.6 oz)    Exam  General: Well developed, well nourished, NAD, appears stated age  HEENT: NCAT, mucous membranes moist.   Neck: Supple, + JVD, no masses  Cardiovascular: S1 S2 auscultated, iregular, 3/6SEM  Respiratory: Bibasilar crackles.   Abdomen: Soft, nontender, nondistended, + bowel sounds  Extremities: warm dry without cyanosis clubbing. LE edema. Left great toe wound  Neuro: AAOx3, nonfocal  Psych: Normal affect and demeanor with intact judgement and insight  Data Reviewed: I have personally reviewed following labs and imaging studies  CBC:  Recent Labs Lab 01/06/16 2354 01/08/16 0338 01/09/16 0548 01/10/16 0440 01/11/16 0508  WBC 8.5 8.9 7.7 8.1 8.5  HGB 10.9* 10.9* 10.8* 11.1* 11.5*  HCT 34.5* 36.1 34.2* 35.8* 35.9*  MCV 104.9* 105.9* 104.3* 104.1* 103.2*  PLT 200 185 184 187 [id number]   Basic Metabolic Panel:  Recent Labs Lab 01/06/16 2354 01/08/16 0338 01/09/16 0548 01/10/16 0440 01/11/16 0508  NA 137 135 136 137 136  K 4.0 3.5 3.7 3.9 4.4  CL 100* 99* 98* 95* 94*  CO2 31 28 29  32  35*  GLUCOSE 145* 121* 114* 84 112*  BUN 21* 21* 22* 19 18  CREATININE 0.77 0.76 0.86 0.85 0.86  CALCIUM 8.4* 8.5* 8.4* 8.6* 8.7*   GFR: Estimated Creatinine Clearance: 45 mL/min (by C-G formula based on Cr of 0.86). Liver Function Tests: No results for input(s): AST, ALT, ALKPHOS, BILITOT, PROT, ALBUMIN in the last 168 hours. No results for input(s): LIPASE, AMYLASE in the last 168 hours. No results for input(s): AMMONIA in the last 168 hours. Coagulation Profile:  Recent Labs Lab 01/07/16 1238 01/08/16 0338 01/09/16 0549 01/10/16 0440 01/11/16 0508  INR 1.67* 1.66* 1.73* 1.84* 1.96*   Cardiac Enzymes: No results for input(s): CKTOTAL, CKMB, CKMBINDEX, TROPONINI in the last 168 hours. BNP (last 3 results) No results for input(s): PROBNP in the last 8760 hours. HbA1C: No results for input(s): HGBA1C in the last 72 hours. CBG:  Recent Labs Lab  01/10/16 0820 01/10/16 1142 01/10/16 1624 01/10/16 2123 01/11/16 0740  GLUCAP 86 122* 156* 122* 97   Lipid Profile: No results for input(s): CHOL, HDL, LDLCALC, TRIG, CHOLHDL, LDLDIRECT in the last 72 hours. Thyroid Function Tests: No results for input(s): TSH, T4TOTAL, FREET4, T3FREE, THYROIDAB in the last 72 hours. Anemia Panel: No results for input(s): VITAMINB12, FOLATE, FERRITIN, TIBC, IRON, RETICCTPCT in the last 72 hours. Urine analysis:    Component Value Date/Time   COLORURINE YELLOW 12/30/2015 2320   APPEARANCEUR CLOUDY* 12/30/2015 2320   LABSPEC 1.020 12/30/2015 2320   PHURINE 5.0 12/30/2015 2320   GLUCOSEU NEGATIVE 12/30/2015 2320   HGBUR NEGATIVE 12/30/2015 2320   BILIRUBINUR NEGATIVE 12/30/2015 2320   KETONESUR NEGATIVE 12/30/2015 2320   PROTEINUR 30* 12/30/2015 2320   UROBILINOGEN 0.2 10/09/2012 1551   NITRITE NEGATIVE 12/30/2015 2320   LEUKOCYTESUR SMALL* 12/30/2015 2320   Sepsis Labs: @LABRCNTIP (procalcitonin:4,lacticidven:4)  ) Recent Results (from the past 240 hour(s))  MRSA PCR Screening     Status: Abnormal   Collection Time: 01/09/16  3:03 AM  Result Value Ref Range Status   MRSA by PCR POSITIVE (A) NEGATIVE Final    Comment:        The GeneXpert MRSA Assay (FDA approved for NASAL specimens only), is one component of a comprehensive MRSA colonization surveillance program. It is not intended to diagnose MRSA infection nor to guide or monitor treatment for MRSA infections. RESULT CALLED TO, READ BACK BY AND VERIFIED WITH: Sherry Ruffing RN AT [phone] 01/09/16 BY [facility]       Radiology Studies: Ct Chest Wo Contrast  01/09/2016  CLINICAL DATA:  Chest pain for the past few days. Shortness of breath. EXAM: CT CHEST WITHOUT CONTRAST TECHNIQUE: Multidetector CT imaging of the chest was performed following the standard protocol without IV contrast. COMPARISON:  Portable chest obtained yesterday. FINDINGS: Mediastinum/Lymph Nodes: Dense coronary artery  calcifications. Mildly enlarged heart. Mildly prominent mediastinal lymph nodes without pathological enlargement. Small pericardial effusion with a maximum thickness of 10 mm. Lungs/Pleura: Moderate to large-sized right pleural effusion and moderate sized left pleural effusion. Bilateral lower lobe compressive atelectasis. Bilateral cylindrical bronchiectasis and patchy densities. No lung nodules. Upper abdomen: Atheromatous aortic calcifications. Musculoskeletal: Thoracic spine degenerative changes. Sternomanubrial and bilateral sternoclavicular degenerative changes. Mild scoliosis. IMPRESSION: 1. Cylindrical bronchiectasis and patchy densities throughout the majority of both lungs, most pronounced in the upper lobes. The patchy densities could represent chronic changes associated with the bronchiectasis or areas of active inflammation or infection. 2. Moderate to large-sized right pleural effusion and moderate-sized left pleural effusion. 3. Bilateral compressive atelectasis.  4. Dense coronary artery atheromatous calcifications. 5. Aortic atherosclerosis. Electronically Signed   By: Claudie Revering M.D.   On: 01/09/2016 16:31   Dg Chest Port 1 View  01/11/2016  CLINICAL DATA:  Acute respiratory failure, acute and chronic CHF, atrial fibrillation EXAM: PORTABLE CHEST 1 VIEW COMPARISON:  Portable chest x-ray of January 09, 2016 FINDINGS: The lungs oral adequately inflated. Confluent interstitial and alveolar opacities persist bilaterally. There are bilateral pleural effusions greater on the right than on the left. The cardiac silhouette is mildly enlarged. There is tortuosity of the ascending and descending thoracic aorta. There is calcification in the wall of the aortic arch. The PICC line tip projects over the mid to distal SVC. IMPRESSION: Persistent confluent interstitial and alveolar opacities consistent with pneumonia with coexisting pulmonary edema. Stable bilateral pleural effusions greater on the right than on  the left. Aortic atherosclerosis. Electronically Signed   By: David  Martinique M.D.   On: 01/11/2016 07:21   Dg Chest Port 1 View  01/09/2016  CLINICAL DATA:  Status post PICC placement today. EXAM: PORTABLE CHEST 1 VIEW COMPARISON:  CT chest today.  Single view of the chest 01/08/2016. FINDINGS: Right PICC is in place with the tip in the lower superior vena cava. Bilateral pleural effusions and extensive airspace disease persist without change. No pneumothorax. Heart size is enlarged. IMPRESSION: Tip of right PICC projects the lower superior vena cava. No change in bilateral effusions and airspace disease. Electronically Signed   By: Inge Rise M.D.   On: 01/09/2016 18:18     Scheduled Meds: . atorvastatin  20 mg Oral Daily  . Chlorhexidine Gluconate Cloth  6 each Topical Q0600  . digoxin  0.125 mg Oral Daily  . ferrous sulfate  325 mg Oral Q breakfast  . furosemide  80 mg Intravenous BID  . insulin aspart  0-9 Units Subcutaneous TID WC  . LORazepam  0.5 mg Intravenous Once  . losartan  25 mg Oral Daily  . metoprolol succinate  50 mg Oral BID  . mupirocin ointment   Nasal BID  . potassium chloride  20 mEq Oral TID  . saccharomyces boulardii  250 mg Oral BID  . sodium chloride flush  10-40 mL Intracatheter Q12H  . sodium chloride flush  3 mL Intravenous Q12H  . sodium chloride flush  3 mL Intravenous Q12H  . sodium chloride flush  3 mL Intravenous Q12H  . spironolactone  12.5 mg Oral Daily  . warfarin  2 mg Oral ONCE-1800  . Warfarin - Pharmacist Dosing Inpatient   Does not apply q1800   Continuous Infusions: . sodium chloride 10 mL (01/10/16 1426)  . heparin 1,150 Units/hr (01/11/16 0900)     LOS: 11 days   Time Spent in minutes   45 minutes  Marika Mahaffy D.O. on 01/11/2016 at 10:05 AM  Between 7am to 7pm - Pager - [phone]  After 7pm go to [email or web address] - password [email or web address]  And look for the night coverage person covering for me after hours  [facility]  Group Office  [phone]

## 2016-01-11 NOTE — Progress Notes (Signed)
ANTICOAGULATION CONSULT NOTE - Follow Up Consult  Pharmacy Consult for Heparin/warfarin Indication: atrial fibrillation  Labs:  Recent Labs  01/09/16 0548 01/09/16 0549 01/10/16 0440 01/11/16 0508  HGB 10.8*  --  11.1*  --   HCT 34.2*  --  35.8*  --   PLT 184  --  187  --   LABPROT  --  20.3* 21.2* 22.2*  INR  --  1.73* 1.84* 1.96*  HEPARINUNFRC  --  0.34 0.43 0.22*  CREATININE 0.86  --  0.85 0.86    Estimated Creatinine Clearance: 45 mL/min (by C-G formula based on Cr of 0.86).   Assessment: 82yo F continuing on heparin and warfarin for afib. Warfarin PTA restarted post cath on 7/7. Heparin level down to subtherapeutic (0.22) and INR up to 1.96 - almost therapeutic.  PTA warfarin dose: 1.25 mg daily  Goal of Therapy:  INR 2-3 Heparin level 0.3-0.7 units/ml  Monitor platelets by anticoagulation protocol: Yes   Plan:  Increase heparin to 1150 units/hr Warfarin 2 mg again tonight F/u heparin level in 8 hours Daily INR  Sherlon Handing, PharmD, BCPS Clinical pharmacist, pager [phone] 01/11/2016 6:02 AM

## 2016-01-11 NOTE — Progress Notes (Signed)
PULMONARY / CRITICAL CARE MEDICINE   Name: Shannon [name] MRN: [id number] DOB: Oct 18, 1931    ADMISSION DATE:  12/30/2015 CONSULTATION DATE:  01/09/16  REFERRING MD:  Dr Karleen Hampshire  CHIEF COMPLAINT:  Persistent shortness of breath.   HISTORY OF PRESENT ILLNESS:   Shannon [name] is a 82 y.o. female with CAD status post stenting, CHF, paroxysmal atrial fibrillation, diabetes mellitus type 2, hyperlipidemia was brought to the ER after patient was complaining of abdominal pain and shortness of breath. She was recently seen by her outpatient Cardiologist and c/o weakness and poor appetite. She also c/o non-productive cough starting about 1 week prior to admission. Of note her weight has increased 30 pounds over the last 3 months, despite her regular Lasix dose of 20 mg daily. She was noted to have bilateral lower extremity edema on admission. Since admission she has been diuresed and is currently 9 liters negative for her stay, recent ECHO and right heart cath noted EF to be 20-25% (previously 50-55% on 04/07/15)  Pulmonology is consulted for evaluation of persistent shortness of breath despite diuresis. Shortness of breath persist despite aggressive diuresis and CXR concerning for bilateral diffuse pulmonary infiltrates.     VITAL SIGNS: BP 104/52 mmHg  Pulse 58  Temp(Src) 97.7 F (36.5 C) (Oral)  Resp 12  Ht 5\' 2"  (1.575 m)  Wt 145 lb 11.6 oz (66.1 kg)  BMI 26.65 kg/m2  SpO2 100%  2 liters  Filed Weights   01/09/16 0344 01/10/16 0400 01/11/16 0419  Weight: 152 lb 1.9 oz (69 kg) 148 lb 13 oz (67.5 kg) 145 lb 11.6 oz (66.1 kg)   HEMODYNAMICS: CVP:  [11 mmHg-18 mmHg] 11 mmHg  VENTILATOR SETTINGS:    INTAKE / OUTPUT:  Intake/Output Summary (Last 24 hours) at 01/11/16 1255 Last data filed at 01/11/16 1242  Gross per 24 hour  Intake 664.34 ml  Output   3475 ml  Net -2810.66 ml    PHYSICAL EXAMINATION: General: Easily aroused  elderly female in  No distress.  Neuro:  Alert  and oriented, follows all commands, no focal deficits.  HEENT: moist mucous membranes, no JVD  Cardiovascular: S1S2, irregular rate, + murmur, lower extremities wrapped bilaterally Lungs:  Crackles throughout, on 2 LNC no accessory use.  Abdomen: Obese, BS x quadrants Musculoskeletal: MAEW Skin: Bilateral leg wraps, bruising  LABS:  BMET  Recent Labs Lab 01/09/16 0548 01/10/16 0440 01/11/16 0508  NA 136 137 136  K 3.7 3.9 4.4  CL 98* 95* 94*  CO2 29 32 35*  BUN 22* 19 18  CREATININE 0.86 0.85 0.86  GLUCOSE 114* 84 112*    Electrolytes  Recent Labs Lab 01/09/16 0548 01/10/16 0440 01/11/16 0508  CALCIUM 8.4* 8.6* 8.7*    CBC  Recent Labs Lab 01/09/16 0548 01/10/16 0440 01/11/16 0508  WBC 7.7 8.1 8.5  HGB 10.8* 11.1* 11.5*  HCT 34.2* 35.8* 35.9*  PLT 184 187 203    Coag's  Recent Labs Lab 01/09/16 0549 01/10/16 0440 01/11/16 0508  INR 1.73* 1.84* 1.96*    Sepsis Markers No results for input(s): LATICACIDVEN, PROCALCITON, O2SATVEN in the last 168 hours.  ABG  Recent Labs Lab 01/06/16 0810 01/08/16 2126  PHART 7.371 7.456*  PCO2ART 51.0* 45.6*  PO2ART 57.0* 55.0*    Liver Enzymes No results for input(s): AST, ALT, ALKPHOS, BILITOT, ALBUMIN in the last 168 hours.  Cardiac Enzymes No results for input(s): TROPONINI, PROBNP in the last 168 hours.  Glucose  Recent Labs  Lab 01/09/16 2147 01/10/16 0820 01/10/16 1142 01/10/16 1624 01/10/16 2123 01/11/16 0740  GLUCAP 110* 86 122* 156* 122* 97    Imaging Dg Chest Port 1 View  01/11/2016  CLINICAL DATA:  Acute respiratory failure, acute and chronic CHF, atrial fibrillation EXAM: PORTABLE CHEST 1 VIEW COMPARISON:  Portable chest x-ray of January 09, 2016 FINDINGS: The lungs oral adequately inflated. Confluent interstitial and alveolar opacities persist bilaterally. There are bilateral pleural effusions greater on the right than on the left. The cardiac silhouette is mildly enlarged. There is  tortuosity of the ascending and descending thoracic aorta. There is calcification in the wall of the aortic arch. The PICC line tip projects over the mid to distal SVC. IMPRESSION: Persistent confluent interstitial and alveolar opacities consistent with pneumonia with coexisting pulmonary edema. Stable bilateral pleural effusions greater on the right than on the left. Aortic atherosclerosis. Electronically Signed   By: David  Martinique M.D.   On: 01/11/2016 07:21     STUDIES:  7/10: CT chest IMPRESSION: 1. Cylindrical bronchiectasis and patchy densities throughout the majority of both lungs, most pronounced in the upper lobes. The patchy densities could represent chronic changes associated with the bronchiectasis or areas of active inflammation or infection. 2. Moderate to large-sized right pleural effusion and moderate-sized left pleural effusion. 3. Bilateral compressive atelectasis. 4. Dense coronary artery atheromatous calcifications. 5. Aortic atherosclerosis  CULTURES: 7/10: MRSA screen positive Sputum pending  ANTIBIOTICS: Vanc  7/9 Cefepime 7/9  SIGNIFICANT EVENTS: 7/7 pt underwent cardiac cath, shows stable CAD, maximize medical therapy.  7/8 worsening sob, earlier this am , repeat CXR shows mild increase in interstitial edema, she received 2 doses of IV lasix .  7/9 pt seen and she was mouth breathing, on 5 lit of [place] oxygen, currently on IV lasix.  7/10 pt was transferred to stepdown last night for worsening dyspnea and was on NRB transiently.  Pulmonology consulted for evaluation of amiodarone toxicity.  7/11 Tolerating aggressive diuresis, weight 148, net negative 1600 overnight, weaned to 2L [place] 7/12 weight down further. CXR about the same. Went for cardioversion and returned to ICU in NSR.   LINES/TUBES: 7/5: PIV 7/8: PIV 7/10>>Right PICC  ASSESSMENT / PLAN:  Acute on Chronic hypoxemic respiratory failure in setting of bilateral airspace disease and moderate  R>L effusions  Previous Diagnosis of central sleep apnea Pulmonary hypertension on right heart cath -Amiodarone stopped per heart failure on 7/10 -favor pulmonary edema and transudative effusions and less likely HCAP or amio toxicity as the etiology of her acute component of her hypoxia.   -clinically improving w/ diuresis and CXR stable to mod improved.  plan -Continue aggressive diuresis -->hope that now she is in NSR should improve even more -ck PCT, if negative dc abx -No indication for steroids at this time -She wanted to hold off on thora; although we might consider if renal fxn worsens w/ on-going diuresis  -Nocturnal BiPAP - Wean oxygen as able - Trend CXR daily  Atrial fibrillation-->s/p cardioversion to NSR [id number] Acute systolic CHF (LVEF 0000000, perhaps rate related) Plan - Heparin gtt per cards   Erick Colace ACNP-BC [place] Pager # [phone] OR # [phone] if no answer

## 2016-01-11 NOTE — Procedures (Addendum)
Electrical Cardioversion Procedure Note Shannon [name] [id number] [birth year]  Procedure: Electrical Cardioversion Indications:  Atrial Fibrillation  Procedure Details Consent: Risks of procedure as well as the alternatives and risks of each were explained to the (patient/caregiver).  Consent for procedure obtained. Time Out: Verified patient identification, verified procedure, site/side was marked, verified correct patient position, special equipment/implants available, medications/allergies/relevent history reviewed, required imaging and test results available.  Performed  Patient placed on cardiac monitor, pulse oximetry, supplemental oxygen as necessary.  Sedation given: Propofol per anesthesiology Pacer pads placed anterior and posterior chest.  Cardioverted 1 time(s).  Cardioverted at [place].  Evaluation Findings: Post procedure EKG shows: NSR Complications: None Patient did tolerate procedure well.   [name] 01/11/2016, 12:25 PM

## 2016-01-11 NOTE — Progress Notes (Addendum)
Advanced Heart Failure Rounding Note   Subjective:   Admitted with chest pain and dyspnea. Diuresing ed with IV lasix. EF down from 50-55% in 2016 to 20-25% this admit. Cath as noted below ok.   Has been in A fib with uncontrolled rate so amio increased to 200 mg twice a day( has been on amio since 2016)  and toprol-xl was increased to 50 mg twice a day. Amio stopped on 7/10 due to concern for amio toxicity and ongoing dyspnea and IV lasix increased with improved urine output noted.   Complaining of fatigue. Denies SOB.    RHC/LHC 01/06/16 Minimal obstructive CAD Patent Stent OM1 RA 13 PA Mean 31 PCWP 16 CO/CI 3.96/2/36 PVR 3.8   ECHO 01/01/16 EF 20-25% Mild MR, dilated left atrium, TR severe, Peak PA pressure 63 mm hg, small pericardial effusion.   ECHO 04/2015 EF 50-55% moderate TR RA moderately Objective:   Weight Range:  Vital Signs:   Temp:  [97.5 F (36.4 C)-98.1 F (36.7 C)] 97.5 F (36.4 C) (07/12 0733) Pulse Rate:  [81-105] 92 (07/12 0733) Resp:  [2-22] 18 (07/12 0733) BP: (82-132)/(59-118) 99/72 mmHg (07/12 0733) SpO2:  [86 %-100 %] 98 % (07/12 0733) Weight:  [145 lb 11.6 oz (66.1 kg)] 145 lb 11.6 oz (66.1 kg) (07/12 0419) Last BM Date: 01/09/16  Weight change: Filed Weights   01/09/16 0344 01/10/16 0400 01/11/16 0419  Weight: 152 lb 1.9 oz (69 kg) 148 lb 13 oz (67.5 kg) 145 lb 11.6 oz (66.1 kg)    Intake/Output:   Intake/Output Summary (Last 24 hours) at 01/11/16 0757 Last data filed at 01/11/16 0600  Gross per 24 hour  Intake    770 ml  Output   2775 ml  Net  -2005 ml     Physical Exam: CVP 10-11 General:  Elderly. Chronically ill appearing. No resp difficulty. In bed.  HEENT: normal Neck: supple. JVP to jaw. Carotids 2+ bilat; no bruits. No lymphadenopathy or thryomegaly appreciated. Cor: PMI nondisplaced. Irregular  rate & rhythm. No rubs, gallops. 3/6 HSM LLSB.  Lungs: Crackles in the bases. On 6 liters oxygen  Abdomen: soft, nontender,  nondistended. No hepatosplenomegaly. No bruits or masses. Good bowel sounds. Extremities: no cyanosis, clubbing, rash, R and LLE coban wrap. Edema. RUE PICC.  Neuro: alert & orientedx3, cranial nerves grossly intact. moves all 4 extremities w/o difficulty. Affect pleasant  Telemetry:  A Fib 100s   Labs: Basic Metabolic Panel:  Recent Labs Lab 01/06/16 2354 01/08/16 0338 01/09/16 0548 01/10/16 0440 01/11/16 0508  NA 137 135 136 137 136  K 4.0 3.5 3.7 3.9 4.4  CL 100* 99* 98* 95* 94*  CO2 31 28 29  32 35*  GLUCOSE 145* 121* 114* 84 112*  BUN 21* 21* 22* 19 18  CREATININE 0.77 0.76 0.86 0.85 0.86  CALCIUM 8.4* 8.5* 8.4* 8.6* 8.7*    Liver Function Tests: No results for input(s): AST, ALT, ALKPHOS, BILITOT, PROT, ALBUMIN in the last 168 hours. No results for input(s): LIPASE, AMYLASE in the last 168 hours. No results for input(s): AMMONIA in the last 168 hours.  CBC:  Recent Labs Lab 01/06/16 2354 01/08/16 0338 01/09/16 0548 01/10/16 0440 01/11/16 0508  WBC 8.5 8.9 7.7 8.1 8.5  HGB 10.9* 10.9* 10.8* 11.1* 11.5*  HCT 34.5* 36.1 34.2* 35.8* 35.9*  MCV 104.9* 105.9* 104.3* 104.1* 103.2*  PLT 200 185 184 187 203    Cardiac Enzymes: No results for input(s): CKTOTAL, CKMB, CKMBINDEX, TROPONINI  in the last 168 hours.  BNP: BNP (last 3 results)  Recent Labs  12/30/15 2327 01/07/16 1038  BNP 593.0* 734.1*    ProBNP (last 3 results) No results for input(s): PROBNP in the last 8760 hours.    Other results:  Imaging: Ct Chest Wo Contrast  01/09/2016  CLINICAL DATA:  Chest pain for the past few days. Shortness of breath. EXAM: CT CHEST WITHOUT CONTRAST TECHNIQUE: Multidetector CT imaging of the chest was performed following the standard protocol without IV contrast. COMPARISON:  Portable chest obtained yesterday. FINDINGS: Mediastinum/Lymph Nodes: Dense coronary artery calcifications. Mildly enlarged heart. Mildly prominent mediastinal lymph nodes without  pathological enlargement. Small pericardial effusion with a maximum thickness of 10 mm. Lungs/Pleura: Moderate to large-sized right pleural effusion and moderate sized left pleural effusion. Bilateral lower lobe compressive atelectasis. Bilateral cylindrical bronchiectasis and patchy densities. No lung nodules. Upper abdomen: Atheromatous aortic calcifications. Musculoskeletal: Thoracic spine degenerative changes. Sternomanubrial and bilateral sternoclavicular degenerative changes. Mild scoliosis. IMPRESSION: 1. Cylindrical bronchiectasis and patchy densities throughout the majority of both lungs, most pronounced in the upper lobes. The patchy densities could represent chronic changes associated with the bronchiectasis or areas of active inflammation or infection. 2. Moderate to large-sized right pleural effusion and moderate-sized left pleural effusion. 3. Bilateral compressive atelectasis. 4. Dense coronary artery atheromatous calcifications. 5. Aortic atherosclerosis. Electronically Signed   By: Claudie Revering M.D.   On: 01/09/2016 16:31   Dg Chest Port 1 View  01/11/2016  CLINICAL DATA:  Acute respiratory failure, acute and chronic CHF, atrial fibrillation EXAM: PORTABLE CHEST 1 VIEW COMPARISON:  Portable chest x-ray of January 09, 2016 FINDINGS: The lungs oral adequately inflated. Confluent interstitial and alveolar opacities persist bilaterally. There are bilateral pleural effusions greater on the right than on the left. The cardiac silhouette is mildly enlarged. There is tortuosity of the ascending and descending thoracic aorta. There is calcification in the wall of the aortic arch. The PICC line tip projects over the mid to distal SVC. IMPRESSION: Persistent confluent interstitial and alveolar opacities consistent with pneumonia with coexisting pulmonary edema. Stable bilateral pleural effusions greater on the right than on the left. Aortic atherosclerosis. Electronically Signed   By: David  Martinique M.D.   On:  01/11/2016 07:21   Dg Chest Port 1 View  01/09/2016  CLINICAL DATA:  Status post PICC placement today. EXAM: PORTABLE CHEST 1 VIEW COMPARISON:  CT chest today.  Single view of the chest 01/08/2016. FINDINGS: Right PICC is in place with the tip in the lower superior vena cava. Bilateral pleural effusions and extensive airspace disease persist without change. No pneumothorax. Heart size is enlarged. IMPRESSION: Tip of right PICC projects the lower superior vena cava. No change in bilateral effusions and airspace disease. Electronically Signed   By: Inge Rise M.D.   On: 01/09/2016 18:18     Medications:     Scheduled Medications: . atorvastatin  20 mg Oral Daily  . Chlorhexidine Gluconate Cloth  6 each Topical Q0600  . digoxin  0.125 mg Oral Daily  . ferrous sulfate  325 mg Oral Q breakfast  . furosemide  80 mg Intravenous BID  . insulin aspart  0-9 Units Subcutaneous TID WC  . LORazepam  0.5 mg Intravenous Once  . losartan  25 mg Oral Daily  . metoprolol succinate  50 mg Oral BID  . mupirocin ointment   Nasal BID  . potassium chloride  20 mEq Oral TID  . saccharomyces boulardii  250 mg Oral BID  .  sodium chloride flush  10-40 mL Intracatheter Q12H  . sodium chloride flush  3 mL Intravenous Q12H  . sodium chloride flush  3 mL Intravenous Q12H  . sodium chloride flush  3 mL Intravenous Q12H  . spironolactone  12.5 mg Oral Daily  . warfarin  2 mg Oral ONCE-1800  . Warfarin - Pharmacist Dosing Inpatient   Does not apply q1800    Infusions: . sodium chloride 10 mL (01/10/16 1426)  . heparin 1,150 Units/hr (01/11/16 0605)    PRN Medications: sodium chloride, acetaminophen, ALPRAZolam, benzonatate, HYDROcodone-acetaminophen, nitroGLYCERIN, ondansetron **OR** ondansetron (ZOFRAN) IV, sodium chloride flush, sodium chloride flush, sodium chloride flush   Assessment/Plan/Discussion  1. New onset of a cardiomyopathy with A/C Systolic heart failure .  ECHO EF 7/2 20-5% from  04/2015 EF 50-55%. 01/06/16 LHC ok. RHC ok with adequate cardiac output.  NYHA IIIb. Volume status improving. CVP 11. CO-OX 48%. Would not favor milrinone with A fib.  Continue 80 mg IV lasix twice a day. Continue  metoprolol XL 50 mg twice a day. Continue losartan 25 mg daily. Continue digoxin 0.125 mg daily.  Check dig level tomorrow.  2. CAD-DES OM1 2007. LHC 01/06/16 Widely patent circumflex/obtuse marginal stent.  On coumadin + statin.  3. DMII- sliding scale.  4.  Hyperlipidemia-on statin 5. History of TIA- 6. Chronic A fib-at least since August 2016. Has been on amio for rate control. I dont see where she has been cardioverted before. On ECHO left atrium dilated so not sure we could get back in NSR. Likely need to focus on rate control.  Pharmacy dosing coumadin. INR 1.96. Continue heparin. For DC-CV today.  After discussion with PCCM - no further amiodarone due to concern for amio toxicity.  EP consulted.  7. Toe ulcer 8. Severe TR -ECHO 01/01/16.  9. HCAP- on antibiotics.  In 6 liters [place] 10. L /R pleural effusion- CCM following. Offered thoracentesis however she declined.   Consult PT. Palliative care following.   Length of Stay: [place] NP-C 01/11/2016, 7:57 AM  Advanced Heart Failure Team Pager [phone] (M-F; 7a - 4p)  Please contact [facility] for night-coverage after hours (4p -7a ) and weekends on [email or web address]  Patient seen with NP, agree with the above note.  1. Atrial fibrillation: Appears chronic since 8/16. HR appears to have been poorly controlled chronically. Now down to 90s on digoxin + amiodarone + Toprol XL 50 mg bid, but now there is concern for amiodarone lung toxicity with bilateral patchy infiltrates on CXR and worsening dyspnea despite diuresis.  - Pulmonary has seen, chest CT showed bronchiectasis and patchy bilateral densities. I discussed this with Dr Lake Bells yesterday, think it is best to stay off amiodarone as we cannot rule out pulmonary  toxicity.  - Rate control has been difficult. Continue Toprol XL 50 mg bid and continue digoxin 0.125. No BP room to increase Toprol XL further. Would ideally cardiovert her, not sure that she will hold NSR without some sort of rhythm control med and not sure she will hold even then. No amiodarone given concern for amiodarone pulmonary, no Tikosyn b/c she just stopped amiodarone and has been on it for months. I talked with Dr Rayann Heman, as amiodarone is still in her system will try DCCV today.  INR has been therapeutic in the past and she has been covered with IV heparin while INR subtherapeutic this admission so do not think we have to do TEE. Will consider ablation/BiV pacing given significant fall  in EF with worry for tachy-mediated CMP if cardioversion unsuccessful or does not hold.  - On heparin gtt while INR subtherapeutic on warfarin.  2. Acute systolic CHF: New fall in EF to 20-25%, nonischemic cardiomyopathy with no obstructive disease (patent OM stent). Also with severe TR. I am concerned that there is a component of tachycardia-mediated cardiomyopathy. RHC showed preserved cardiac output but co-ox low at 48% this morning.  She still appears to have some volume overload on exam though difficult exam with severe TR. CVP 11. She diuresed well with Lasix yesterday, weight down.  - Continue Lasix 80 mg IV bid today.  - Continue losartan, Toprol XL, spironolactone, and digoxin at current doses.  Check digoxin level in am.  - Repeat co-ox after DCCV.  Would hold off on milrinone at this point with difficult-to-control atrial fibrillation.   3. Severe TR 4. CAD: Nonobstructive on cath this admission.  5. ID: Antibiotics stopped as PNA unlikely.   [name] 01/11/2016 8:22 AM

## 2016-01-11 NOTE — Progress Notes (Signed)
Physical Therapy Treatment Patient Details Name: Shannon [name] MRN: [id number] DOB: 09-Feb-1932 Today's Date: 01/11/2016    History of Present Illness Shannon [name] is a 82 y.o. female with CAD status post stenting, CHF, paroxysmal atrial fibrillation, diabetes mellitus type 2, hyperlipidemia who was admitted due to acute hypoxic respiratory faiilure with new cardiomyopathy with acute on chronic CHF.    PT Comments    Shannon [name] made good progress today, ambulating 40 ft with min assist, although fatiguing quickly.  Given pt's current functional status, follow up recommendations have been updated to SNF.  If family able to provide 24/7 assist/supervision she may be able to return home at d/c.  However, per Palliative note, family unable to provide assist.   Follow Up Recommendations  SNF (many need to consider SNF if no assist available)     Equipment Recommendations  Other (comment) (toilet riser)    Recommendations for Other Services       Precautions / Restrictions Precautions Precautions: Fall (telemetry) Precaution Comments: monitor O2, HR, BP Restrictions Weight Bearing Restrictions: No    Mobility  Bed Mobility Overal bed mobility: Needs Assistance Bed Mobility: Supine to Sit;Sit to Supine     Supine to sit: Min guard;HOB elevated Sit to supine: Min guard   General bed mobility comments: Increased time and effort. Use of bed rail.  Transfers Overall transfer level: Needs assistance Equipment used: None Transfers: Sit to/from [facility] Sit to Stand: Min assist Stand pivot transfers: Min assist       General transfer comment: Min assist for balance and cues for hand placement as pt pivot bed<>BSC and sit<>stand from bed.  Ambulation/Gait Ambulation/Gait assistance: Min assist Ambulation Distance (Feet): 40 Feet Assistive device: None Gait Pattern/deviations: Step-through pattern;Decreased stride length;Trunk flexed Gait  velocity: decreased   General Gait Details: Cues for upright posture and min assist to steady.  Pt fatigues quickly but very encouraged about ambulating.  Pt reaches out for bed and other furniture while ambulating in room.   Stairs            Wheelchair Mobility    Modified Rankin (Stroke Patients Only)       Balance Overall balance assessment: Needs assistance Sitting-balance support: No upper extremity supported;Feet supported Sitting balance-Leahy Scale: Good     Standing balance support: Single extremity supported;During functional activity Standing balance-Leahy Scale: Poor Standing balance comment: UE support for static and dynamic activities                    Cognition Arousal/Alertness: Awake/alert Behavior During Therapy: WFL for tasks assessed/performed Overall Cognitive Status: Within Functional Limits for tasks assessed                      Exercises General Exercises - Upper Extremity Shoulder Flexion: AROM;Both;10 reps;Seated General Exercises - Lower Extremity Ankle Circles/Pumps: AROM;Both;10 reps;Seated Long Arc Quad: AROM;Both;10 reps;Seated Hip Flexion/Marching: Both;10 reps;Seated Other Exercises Other Exercises: scapular retraction x 15 reps with pursed lip breathing sitting EOB    General Comments General comments (skin integrity, edema, etc.): BP 100/68 sitting on BSC, pt denies dizziness.  HR up to 118 with activity.  SpO2 down to 86% x1 with pivot to [facility], otherwise remains at or above 89%.      Pertinent Vitals/Pain Pain Assessment: Faces Faces Pain Scale: Hurts little more Pain Location: buttocks Pain Descriptors / Indicators: Discomfort Pain Intervention(s): Limited activity within patient's tolerance;Monitored during session    Home Living  Prior Function            PT Goals (current goals can now be found in the care plan section) Acute Rehab PT Goals Patient Stated Goal: to get  stronger PT Goal Formulation: With patient Time For Goal Achievement: 01/24/16 Potential to Achieve Goals: Good Progress towards PT goals: Progressing toward goals    Frequency  Min 3X/week    PT Plan Discharge plan needs to be updated    Co-evaluation             End of Session Equipment Utilized During Treatment: Oxygen;Gait belt Activity Tolerance: Patient limited by fatigue;Patient tolerated treatment well Patient left: with call bell/phone within reach;in bed;Other (comment);with bed alarm set (on bed pan)     Time: [id number] PT Time Calculation (min) (ACUTE ONLY): 33 min  Charges:  $Gait Training: 8-22 mins $Therapeutic Exercise: 8-22 mins                    G Codes:      Collie Siad PT, DPT  Pager: [phone] Phone: [phone] 01/11/2016, 11:10 AM

## 2016-01-11 NOTE — Progress Notes (Signed)
Daily Progress Note   Patient Name: Shannon [name]       Date: 01/11/2016 DOB: 1932/06/01  Age: 82 y.o. MRN#: [id number] Attending Physician: Cristal Ford, DO Primary Care Physician: Haywood Pao, MD Admit Date: 12/30/2015  Reason for Consultation/Follow-up: Establishing goals of care  Subjective: I met again today with Shannon [name] and we were joined by her son, Louie Casa, and his wife. 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 I also explained that a lot of these disease trajectories are progressive as well.   They ask many questions regarding SNF rehab vs rehab at home and I strongly recommend SNF rehab. They are also very concerned about her Unna boot and say that this was not supposed to be continued. They feel very strongly that these should be d/c and they have been recommended compression hose by either vascular or wound care. Will change orders.    I also spent some time discussing Living Will and code status. They all feel very strongly that they desire full code "if there is hope of improvement and not maintain a vegetable." I explained that with her heart disease and comorbitities that it is highly unlikely that she would survive a code and most certainly would have very poor QOL if survived. They continue to request full code for now but they have been fully educated on options.   Length of Stay: 11  Current Medications: Scheduled Meds:  . atorvastatin  20 mg Oral Daily  . Chlorhexidine Gluconate Cloth  6 each Topical Q0600  . digoxin  0.125 mg Oral Daily  . ferrous sulfate  325 mg Oral Q breakfast  . furosemide  80 mg Intravenous BID  . insulin aspart  0-9 Units  Subcutaneous TID WC  . LORazepam  0.5 mg Intravenous Once  . losartan  25 mg Oral Daily  . metoprolol succinate  50 mg Oral BID  . mupirocin ointment   Nasal BID  . potassium chloride  20 mEq Oral TID  . saccharomyces boulardii  250 mg Oral BID  . sodium chloride flush  10-40 mL Intracatheter Q12H  . sodium chloride flush  3 mL Intravenous Q12H  . sodium chloride flush  3 mL Intravenous Q12H  . sodium chloride flush  3 mL Intravenous Q12H  . spironolactone  12.5 mg Oral Daily  . warfarin  2.5 mg Oral ONCE-1800  . Warfarin - Pharmacist Dosing Inpatient   Does not apply q1800    Continuous Infusions: . sodium chloride 10 mL (01/10/16 1426)    PRN Meds: sodium chloride, acetaminophen, ALPRAZolam, benzonatate, HYDROcodone-acetaminophen, midazolam, nitroGLYCERIN, ondansetron **OR** ondansetron (ZOFRAN) IV, promethazine, sodium chloride flush, sodium chloride flush, sodium chloride flush  Physical Exam  Constitutional: She is oriented to person, place, and time. She appears well-developed.  HENT:  Head: Normocephalic and atraumatic.  Cardiovascular: Regular rhythm.  Bradycardia present.   Pulmonary/Chest: Effort normal. No accessory muscle usage. No tachypnea. No respiratory distress.  Abdominal: Soft. Normal appearance.  Neurological: She is alert and oriented to person, place, and time.            Vital Signs: BP 99/69 mmHg  Pulse 58  Temp(Src) 97.5 F (36.4 C) (Oral)  Resp 16  Ht 5' 2"  (1.575 m)  Wt 66.1 kg (145 lb 11.6 oz)  BMI 26.65 kg/m2  SpO2 99% SpO2: SpO2: 99 % O2 Device: O2 Device: Nasal Cannula O2 Flow Rate: O2 Flow Rate (L/min): 3 L/min  Intake/output summary:  Intake/Output Summary (Last 24 hours) at 01/11/16 1557 Last data filed at 01/11/16 1500  Gross per 24 hour  Intake 551.07 ml  Output   4025 ml  Net -3473.93 ml   LBM: Last BM Date: 01/09/16 Baseline Weight: Weight: 74.889 kg (165 lb 1.6 oz) Most recent weight: Weight: 66.1 kg (145 lb 11.6 oz)        Palliative Assessment/Data:      Patient Active Problem List   Diagnosis Date Noted  . Dyspnea   . Shortness of breath   . Pleural effusion   . Persistent atrial fibrillation ([place])   . Pressure ulcer 01/09/2016  . Leg swelling   . SOB (shortness of breath)   . Elevated troponin   . Acute on chronic systolic CHF (congestive heart failure) ([place])   . CAD (coronary artery disease)   . Cardiomyopathy ([place])   . Chronic systolic CHF (congestive heart failure) ([place])   . Acute respiratory failure with hypoxia ([place]) 12/31/2015  . Abdominal pain 12/31/2015  . Long term (current) use of anticoagulants [Z79.01] 10/24/2015  . Foot ulcer, left ([place])   . Chronic venous stasis dermatitis 10/09/2015  . Cellulitis of toe, left great 10/09/2015  . Chronic atrial fibrillation ([place]) 01/31/2015  . Edema 01/31/2015  . Atrial flutter ([place]) 10/09/2012  . UTI (urinary tract infection) 10/09/2012  . MVP (mitral valve prolapse)   . Hyperlipidemia   . Diabetes mellitus [facility])     Palliative Care Assessment & Plan   Patient Profile: 82 y.o. female with past medical history of CAD status post stenting, CHF, paroxysmal atrial fibrillation, diabetes mellitus type 2, hyperlipidemia admitted on 12/30/2015 with shortness of breath and abd pain.   Assessment: Sitting up in recliner.   Recommendations/Plan:  Weakness: PT to follow.   SOB: Continue treatment per heart failure and PCCM.   Goals of Care and Additional Recommendations:  Limitations on Scope of Treatment: Full Scope Treatment  Code Status:    Code Status Orders        Start     Ordered   12/31/15 0427  Full code   Continuous     12/31/15 0428    Code Status History    Date Active Date Inactive Code Status Order ID Comments User Context   10/09/2015  8:00 AM 10/14/2015  7:53 PM Full Code [id number]  Samella Parr, NP ED   10/09/2012  3:19 PM 10/09/2012 11:55 PM Full Code [id number]  Thompson Grayer, MD Inpatient        Prognosis:   < 6 months could be very likely given heart and lung disease.   Discharge Planning:  [place] for rehab with Palliative care service follow-up   Thank you for allowing the Palliative Medicine Team to assist in the care of this patient.   Time In: 1120 Time Out: 1220 Total Time 21mn Prolonged Time Billed  no       Greater than 50%  of this time was spent counseling and coordinating care related to the above assessment and plan.  PPershing Proud NP  Please contact Palliative Medicine Team phone at [phone] questions and concerns.

## 2016-01-11 NOTE — Anesthesia Preprocedure Evaluation (Addendum)
Anesthesia Evaluation  Patient identified by MRN, date of birth, ID band Patient awake    Reviewed: Allergy & Precautions, NPO status , Patient's Chart, lab work & pertinent test results  History of Anesthesia Complications Negative for: history of anesthetic complications  Airway Mallampati: II  TM Distance: >3 FB     Dental  (+) Loose, Dental Advisory Given, Poor Dentition, Chipped, Missing   Pulmonary shortness of breath, sleep apnea , former smoker,    breath sounds clear to auscultation + decreased breath sounds      Cardiovascular hypertension, Pt. on medications and Pt. on home beta blockers + angina (needed NTG 2 nights ago) + CAD, + Past MI and + Cardiac Stents  + dysrhythmias Atrial Fibrillation  Rhythm:Irregular Rate:Normal  echo 12/2015: EF 20-25% w/ diffuse HK, biatrial enlargement, mild AI and MR, severe TR    Neuro/Psych TIA   GI/Hepatic negative GI ROS, Neg liver ROS,   Endo/Other  diabetes, Type 2, Oral Hypoglycemic Agents  Renal/GU negative Renal ROS     Musculoskeletal  (+) Arthritis , Osteoarthritis,    Abdominal   Peds  Hematology Coumadin: INR 1.96   Anesthesia Other Findings   Reproductive/Obstetrics                           Anesthesia Physical Anesthesia Plan  ASA: III  Anesthesia Plan: MAC   Post-op Pain Management:    Induction:   Airway Management Planned: Mask  Additional Equipment:   Intra-op Plan:   Post-operative Plan:   Informed Consent: I have reviewed the patients History and Physical, chart, labs and discussed the procedure including the risks, benefits and alternatives for the proposed anesthesia with the patient or authorized representative who has indicated his/her understanding and acceptance.   Dental advisory given  Plan Discussed with: CRNA, Anesthesiologist and Surgeon  Anesthesia Plan Comments: (Plan routine monitors, MAC)        Anesthesia Quick Evaluation

## 2016-01-11 NOTE — H&P (View-Only) (Signed)
Advanced Heart Failure Rounding Note   Subjective:   Admitted with chest pain and dyspnea. Diuresing ed with IV lasix. EF down from 50-55% in 2016 to 20-25% this admit. Cath as noted below ok.   Has been in A fib with uncontrolled rate so amio increased to 200 mg twice a day( has been on amio since 2016)  and toprol-xl was increased to 50 mg twice a day. Amio stopped on 7/10 due to concern for amio toxicity and ongoing dyspnea and IV lasix increased with improved urine output noted.   Complaining of fatigue. Denies SOB.    RHC/LHC 01/06/16 Minimal obstructive CAD Patent Stent OM1 RA 13 PA Mean 31 PCWP 16 CO/CI 3.96/2/36 PVR 3.8   ECHO 01/01/16 EF 20-25% Mild MR, dilated left atrium, TR severe, Peak PA pressure 63 mm hg, small pericardial effusion.   ECHO 04/2015 EF 50-55% moderate TR RA moderately Objective:   Weight Range:  Vital Signs:   Temp:  [97.5 F (36.4 C)-98.1 F (36.7 C)] 97.5 F (36.4 C) (07/12 0733) Pulse Rate:  [81-105] 92 (07/12 0733) Resp:  [2-22] 18 (07/12 0733) BP: (82-132)/(59-118) 99/72 mmHg (07/12 0733) SpO2:  [86 %-100 %] 98 % (07/12 0733) Weight:  [145 lb 11.6 oz (66.1 kg)] 145 lb 11.6 oz (66.1 kg) (07/12 0419) Last BM Date: 01/09/16  Weight change: Filed Weights   01/09/16 0344 01/10/16 0400 01/11/16 0419  Weight: 152 lb 1.9 oz (69 kg) 148 lb 13 oz (67.5 kg) 145 lb 11.6 oz (66.1 kg)    Intake/Output:   Intake/Output Summary (Last 24 hours) at 01/11/16 0757 Last data filed at 01/11/16 0600  Gross per 24 hour  Intake    770 ml  Output   2775 ml  Net  -2005 ml     Physical Exam: CVP 10-11 General:  Elderly. Chronically ill appearing. No resp difficulty. In bed.  HEENT: normal Neck: supple. JVP to jaw. Carotids 2+ bilat; no bruits. No lymphadenopathy or thryomegaly appreciated. Cor: PMI nondisplaced. Irregular  rate & rhythm. No rubs, gallops. 3/6 HSM LLSB.  Lungs: Crackles in the bases. On 6 liters oxygen  Abdomen: soft, nontender,  nondistended. No hepatosplenomegaly. No bruits or masses. Good bowel sounds. Extremities: no cyanosis, clubbing, rash, R and LLE coban wrap. Edema. RUE PICC.  Neuro: alert & orientedx3, cranial nerves grossly intact. moves all 4 extremities w/o difficulty. Affect pleasant  Telemetry:  A Fib 100s   Labs: Basic Metabolic Panel:  Recent Labs Lab 01/06/16 2354 01/08/16 0338 01/09/16 0548 01/10/16 0440 01/11/16 0508  NA 137 135 136 137 136  K 4.0 3.5 3.7 3.9 4.4  CL 100* 99* 98* 95* 94*  CO2 31 28 29  32 35*  GLUCOSE 145* 121* 114* 84 112*  BUN 21* 21* 22* 19 18  CREATININE 0.77 0.76 0.86 0.85 0.86  CALCIUM 8.4* 8.5* 8.4* 8.6* 8.7*    Liver Function Tests: No results for input(s): AST, ALT, ALKPHOS, BILITOT, PROT, ALBUMIN in the last 168 hours. No results for input(s): LIPASE, AMYLASE in the last 168 hours. No results for input(s): AMMONIA in the last 168 hours.  CBC:  Recent Labs Lab 01/06/16 2354 01/08/16 0338 01/09/16 0548 01/10/16 0440 01/11/16 0508  WBC 8.5 8.9 7.7 8.1 8.5  HGB 10.9* 10.9* 10.8* 11.1* 11.5*  HCT 34.5* 36.1 34.2* 35.8* 35.9*  MCV 104.9* 105.9* 104.3* 104.1* 103.2*  PLT 200 185 184 187 203    Cardiac Enzymes: No results for input(s): CKTOTAL, CKMB, CKMBINDEX, TROPONINI  in the last 168 hours.  BNP: BNP (last 3 results)  Recent Labs  12/30/15 2327 01/07/16 1038  BNP 593.0* 734.1*    ProBNP (last 3 results) No results for input(s): PROBNP in the last 8760 hours.    Other results:  Imaging: Ct Chest Wo Contrast  01/09/2016  CLINICAL DATA:  Chest pain for the past few days. Shortness of breath. EXAM: CT CHEST WITHOUT CONTRAST TECHNIQUE: Multidetector CT imaging of the chest was performed following the standard protocol without IV contrast. COMPARISON:  Portable chest obtained yesterday. FINDINGS: Mediastinum/Lymph Nodes: Dense coronary artery calcifications. Mildly enlarged heart. Mildly prominent mediastinal lymph nodes without  pathological enlargement. Small pericardial effusion with a maximum thickness of 10 mm. Lungs/Pleura: Moderate to large-sized right pleural effusion and moderate sized left pleural effusion. Bilateral lower lobe compressive atelectasis. Bilateral cylindrical bronchiectasis and patchy densities. No lung nodules. Upper abdomen: Atheromatous aortic calcifications. Musculoskeletal: Thoracic spine degenerative changes. Sternomanubrial and bilateral sternoclavicular degenerative changes. Mild scoliosis. IMPRESSION: 1. Cylindrical bronchiectasis and patchy densities throughout the majority of both lungs, most pronounced in the upper lobes. The patchy densities could represent chronic changes associated with the bronchiectasis or areas of active inflammation or infection. 2. Moderate to large-sized right pleural effusion and moderate-sized left pleural effusion. 3. Bilateral compressive atelectasis. 4. Dense coronary artery atheromatous calcifications. 5. Aortic atherosclerosis. Electronically Signed   By: Claudie Revering M.D.   On: 01/09/2016 16:31   Dg Chest Port 1 View  01/11/2016  CLINICAL DATA:  Acute respiratory failure, acute and chronic CHF, atrial fibrillation EXAM: PORTABLE CHEST 1 VIEW COMPARISON:  Portable chest x-ray of January 09, 2016 FINDINGS: The lungs oral adequately inflated. Confluent interstitial and alveolar opacities persist bilaterally. There are bilateral pleural effusions greater on the right than on the left. The cardiac silhouette is mildly enlarged. There is tortuosity of the ascending and descending thoracic aorta. There is calcification in the wall of the aortic arch. The PICC line tip projects over the mid to distal SVC. IMPRESSION: Persistent confluent interstitial and alveolar opacities consistent with pneumonia with coexisting pulmonary edema. Stable bilateral pleural effusions greater on the right than on the left. Aortic atherosclerosis. Electronically Signed   By: David  Martinique M.D.   On:  01/11/2016 07:21   Dg Chest Port 1 View  01/09/2016  CLINICAL DATA:  Status post PICC placement today. EXAM: PORTABLE CHEST 1 VIEW COMPARISON:  CT chest today.  Single view of the chest 01/08/2016. FINDINGS: Right PICC is in place with the tip in the lower superior vena cava. Bilateral pleural effusions and extensive airspace disease persist without change. No pneumothorax. Heart size is enlarged. IMPRESSION: Tip of right PICC projects the lower superior vena cava. No change in bilateral effusions and airspace disease. Electronically Signed   By: Inge Rise M.D.   On: 01/09/2016 18:18     Medications:     Scheduled Medications: . atorvastatin  20 mg Oral Daily  . Chlorhexidine Gluconate Cloth  6 each Topical Q0600  . digoxin  0.125 mg Oral Daily  . ferrous sulfate  325 mg Oral Q breakfast  . furosemide  80 mg Intravenous BID  . insulin aspart  0-9 Units Subcutaneous TID WC  . LORazepam  0.5 mg Intravenous Once  . losartan  25 mg Oral Daily  . metoprolol succinate  50 mg Oral BID  . mupirocin ointment   Nasal BID  . potassium chloride  20 mEq Oral TID  . saccharomyces boulardii  250 mg Oral BID  .  sodium chloride flush  10-40 mL Intracatheter Q12H  . sodium chloride flush  3 mL Intravenous Q12H  . sodium chloride flush  3 mL Intravenous Q12H  . sodium chloride flush  3 mL Intravenous Q12H  . spironolactone  12.5 mg Oral Daily  . warfarin  2 mg Oral ONCE-1800  . Warfarin - Pharmacist Dosing Inpatient   Does not apply q1800    Infusions: . sodium chloride 10 mL (01/10/16 1426)  . heparin 1,150 Units/hr (01/11/16 0605)    PRN Medications: sodium chloride, acetaminophen, ALPRAZolam, benzonatate, HYDROcodone-acetaminophen, nitroGLYCERIN, ondansetron **OR** ondansetron (ZOFRAN) IV, sodium chloride flush, sodium chloride flush, sodium chloride flush   Assessment/Plan/Discussion  1. New onset of a cardiomyopathy with A/C Systolic heart failure .  ECHO EF 7/2 20-5% from  04/2015 EF 50-55%. 01/06/16 LHC ok. RHC ok with adequate cardiac output.  NYHA IIIb. Volume status improving. CVP 11. CO-OX 48%. Would not favor milrinone with A fib.  Continue 80 mg IV lasix twice a day. Continue  metoprolol XL 50 mg twice a day. Continue losartan 25 mg daily. Continue digoxin 0.125 mg daily.  Check dig level tomorrow.  2. CAD-DES OM1 2007. LHC 01/06/16 Widely patent circumflex/obtuse marginal stent.  On coumadin + statin.  3. DMII- sliding scale.  4.  Hyperlipidemia-on statin 5. History of TIA- 6. Chronic A fib-at least since August 2016. Has been on amio for rate control. I dont see where she has been cardioverted before. On ECHO left atrium dilated so not sure we could get back in NSR. Likely need to focus on rate control.  Pharmacy dosing coumadin. INR 1.96. Continue heparin. For DC-CV today.  After discussion with PCCM - no further amiodarone due to concern for amio toxicity.  EP consulted.  7. Toe ulcer 8. Severe TR -ECHO 01/01/16.  9. HCAP- on antibiotics.  In 6 liters [place] 10. L /R pleural effusion- CCM following. Offered thoracentesis however she declined.   Consult PT. Palliative care following.   Length of Stay: [place] NP-C 01/11/2016, 7:57 AM  Advanced Heart Failure Team Pager [phone] (M-F; 7a - 4p)  Please contact [facility] for night-coverage after hours (4p -7a ) and weekends on [email or web address]  Patient seen with NP, agree with the above note.  1. Atrial fibrillation: Appears chronic since 8/16. HR appears to have been poorly controlled chronically. Now down to 90s on digoxin + amiodarone + Toprol XL 50 mg bid, but now there is concern for amiodarone lung toxicity with bilateral patchy infiltrates on CXR and worsening dyspnea despite diuresis.  - Pulmonary has seen, chest CT showed bronchiectasis and patchy bilateral densities. I discussed this with Dr Lake Bells yesterday, think it is best to stay off amiodarone as we cannot rule out pulmonary  toxicity.  - Rate control has been difficult. Continue Toprol XL 50 mg bid and continue digoxin 0.125. No BP room to increase Toprol XL further. Would ideally cardiovert her, not sure that she will hold NSR without some sort of rhythm control med and not sure she will hold even then. No amiodarone given concern for amiodarone pulmonary, no Tikosyn b/c she just stopped amiodarone and has been on it for months. I talked with Dr Rayann Heman, as amiodarone is still in her system will try DCCV today.  INR has been therapeutic in the past and she has been covered with IV heparin while INR subtherapeutic this admission so do not think we have to do TEE. Will consider ablation/BiV pacing given significant fall  in EF with worry for tachy-mediated CMP if cardioversion unsuccessful or does not hold.  - On heparin gtt while INR subtherapeutic on warfarin.  2. Acute systolic CHF: New fall in EF to 20-25%, nonischemic cardiomyopathy with no obstructive disease (patent OM stent). Also with severe TR. I am concerned that there is a component of tachycardia-mediated cardiomyopathy. RHC showed preserved cardiac output but co-ox low at 48% this morning.  She still appears to have some volume overload on exam though difficult exam with severe TR. CVP 11. She diuresed well with Lasix yesterday, weight down.  - Continue Lasix 80 mg IV bid today.  - Continue losartan, Toprol XL, spironolactone, and digoxin at current doses.  Check digoxin level in am.  - Repeat co-ox after DCCV.  Would hold off on milrinone at this point with difficult-to-control atrial fibrillation.   3. Severe TR 4. CAD: Nonobstructive on cath this admission.  5. ID: Antibiotics stopped as PNA unlikely.   [name] 01/11/2016 8:22 AM

## 2016-01-11 NOTE — Transfer of Care (Signed)
Immediate Anesthesia Transfer of Care Note  Patient: Shannon [name]  Procedure(s) Performed: Procedure(s): CARDIOVERSION (N/A)  Patient Location: Endoscopy Unit  Anesthesia Type:MAC  Level of Consciousness: awake and patient cooperative  Airway & Oxygen Therapy: Patient Spontanous Breathing and Patient connected to nasal cannula oxygen  Post-op Assessment: Report given to RN, Post -op Vital signs reviewed and stable and Patient moving all extremities X 4  Post vital signs: Reviewed and stable  Last Vitals:  Filed Vitals:   01/11/16 1133 01/11/16 1232  BP: 106/65 93/52  Pulse: 92 59  Temp:    Resp: 18 14    Last Pain:  Filed Vitals:   01/11/16 1235  PainSc: 0-No pain      Patients Stated Pain Goal: 2 [id number] [phone]  Complications: No apparent anesthesia complications

## 2016-01-11 NOTE — Progress Notes (Signed)
OT Cancellation Note  Patient Details Name: INA RIEVES MRN: [id number] DOB: 1932/05/09   Cancelled Treatment:    Reason Eval/Treat Not Completed: Patient at procedure or test/ unavailable.  Will check back tomorrow as schedule allows   Lucille Passy, OTR/L [id number]   Lucille Passy M 01/11/2016, 11:27 AM

## 2016-01-11 NOTE — Interval H&P Note (Signed)
History and Physical Interval Note:  01/11/2016 12:20 PM  Shannon [name]  has presented today for surgery, with the diagnosis of AFIB  The various methods of treatment have been discussed with the patient and family. After consideration of risks, benefits and other options for treatment, the patient has consented to  Procedure(s): CARDIOVERSION (N/A) as a surgical intervention .  The patient's history has been reviewed, patient examined, no change in status, stable for surgery.  I have reviewed the patient's chart and labs.  Questions were answered to the patient's satisfaction.     Shannon [name]

## 2016-01-12 DIAGNOSIS — L8990 Pressure ulcer of unspecified site, unspecified stage: Secondary | ICD-10-CM

## 2016-01-12 DIAGNOSIS — R05 Cough: Secondary | ICD-10-CM | POA: Insufficient documentation

## 2016-01-12 DIAGNOSIS — R059 Cough, unspecified: Secondary | ICD-10-CM | POA: Insufficient documentation

## 2016-01-12 LAB — CARBOXYHEMOGLOBIN
Carboxyhemoglobin: 1.2 % (ref 0.5–1.5)
METHEMOGLOBIN: 0.8 % (ref 0.0–1.5)
O2 SAT: 56.8 %
TOTAL HEMOGLOBIN: 11.4 g/dL — AB (ref 12.0–16.0)

## 2016-01-12 LAB — GLUCOSE, CAPILLARY
GLUCOSE-CAPILLARY: 118 mg/dL — AB (ref 65–99)
GLUCOSE-CAPILLARY: 145 mg/dL — AB (ref 65–99)
Glucose-Capillary: 157 mg/dL — ABNORMAL HIGH (ref 65–99)
Glucose-Capillary: 138 mg/dL — ABNORMAL HIGH (ref 65–99)

## 2016-01-12 LAB — PROTIME-INR
INR: 2.02 — AB (ref 0.00–1.49)
Prothrombin Time: 22.7 seconds — ABNORMAL HIGH (ref 11.6–15.2)

## 2016-01-12 LAB — BASIC METABOLIC PANEL
ANION GAP: 9 (ref 5–15)
Anion gap: 9 (ref 5–15)
BUN: 18 mg/dL (ref 6–20)
BUN: 17 mg/dL (ref 6–20)
CALCIUM: 9 mg/dL (ref 8.9–10.3)
CHLORIDE: 93 mmol/L — AB (ref 101–111)
CO2: 33 mmol/L — AB (ref 22–32)
CO2: 35 mmol/L — ABNORMAL HIGH (ref 22–32)
CREATININE: 0.91 mg/dL (ref 0.44–1.00)
Calcium: 8.9 mg/dL (ref 8.9–10.3)
Chloride: 92 mmol/L — ABNORMAL LOW (ref 101–111)
Creatinine, Ser: 0.82 mg/dL (ref 0.44–1.00)
GFR calc non Af Amer: >60 mL/min (ref >60)
GFR, EST NON AFRICAN AMERICAN: 57 mL/min — AB (ref >60)
GLUCOSE: 139 mg/dL — AB (ref 65–99)
Glucose, Bld: 135 mg/dL — ABNORMAL HIGH (ref 65–99)
Potassium: 4.8 mmol/L (ref 3.5–5.1)
Potassium: 4.2 mmol/L (ref 3.5–5.1)
SODIUM: 136 mmol/L (ref 135–145)
Sodium: 135 mmol/L (ref 135–145)

## 2016-01-12 LAB — CBC
HEMATOCRIT: 35.7 % — AB (ref 36.0–46.0)
HEMOGLOBIN: 11.1 g/dL — AB (ref 12.0–15.0)
MCH: 32.5 pg (ref 26.0–34.0)
MCHC: 31.1 g/dL (ref 30.0–36.0)
MCV: 104.4 fL — AB (ref 78.0–100.0)
Platelets: 199 10*3/uL (ref 150–400)
RBC: 3.42 MIL/uL — ABNORMAL LOW (ref 3.87–5.11)
RDW: 17.8 % — AB (ref 11.5–15.5)
WBC: 7.5 10*3/uL (ref 4.0–10.5)

## 2016-01-12 LAB — DIGOXIN LEVEL: Digoxin Level: 0.6 ng/mL — ABNORMAL LOW (ref 0.8–2.0)

## 2016-01-12 MED ORDER — LOSARTAN POTASSIUM 25 MG PO TABS
12.5000 mg | ORAL_TABLET | Freq: Every evening | ORAL | Status: DC
  Administered 2016-01-12 – 2016-01-16 (×5): 12.5 mg via ORAL
  Filled 2016-01-12 (×5): qty 1

## 2016-01-12 MED ORDER — LOSARTAN POTASSIUM 25 MG PO TABS: 12.5000 mg | ORAL_TABLET | Freq: Every day | ORAL | Status: DC

## 2016-01-12 MED ORDER — WARFARIN SODIUM 2 MG PO TABS
3.0000 mg | ORAL_TABLET | Freq: Once | ORAL | Status: AC
  Administered 2016-01-12: 3 mg via ORAL
  Filled 2016-01-12: qty 1.5

## 2016-01-12 MED ORDER — FUROSEMIDE 10 MG/ML IJ SOLN
40.0000 mg | Freq: Once | INTRAMUSCULAR | Status: AC
  Administered 2016-01-12: 40 mg via INTRAVENOUS

## 2016-01-12 MED ORDER — METOPROLOL SUCCINATE ER 25 MG PO TB24
12.5000 mg | ORAL_TABLET | Freq: Two times a day (BID) | ORAL | Status: DC
  Administered 2016-01-12 (×2): 12.5 mg via ORAL
  Filled 2016-01-12 (×2): qty 1

## 2016-01-12 MED ORDER — RANOLAZINE ER 500 MG PO TB12
500.0000 mg | ORAL_TABLET | Freq: Two times a day (BID) | ORAL | Status: DC
  Administered 2016-01-12 – 2016-01-16 (×9): 500 mg via ORAL
  Filled 2016-01-12 (×9): qty 1

## 2016-01-12 NOTE — Progress Notes (Signed)
Occupational Therapy Treatment Patient Details Name: Shannon [name] MRN: [id number] DOB: 1932/06/24 Today's Date: 01/12/2016    History of present illness Shannon [name] is a 82 y.o. female with CAD status post stenting, CHF, paroxysmal atrial fibrillation, diabetes mellitus type 2, hyperlipidemia who was admitted due to acute hypoxic respiratory faiilure with new cardiomyopathy with acute on chronic CHF.   OT comments  Pt requires min guard assist for simple ADLs.  02 sats decreased to mid 80s on 6L supplemental 02, and HR elevated (unable to determine how high due to artifact), and pt with A-Fib and SVT (confirmed by monitor tech) while standing at sink to brush teeth.  Pt returned to supine, and converted back to sinus rhythm with HR 61 and 02 sats in mid 90s - RN aware.   Follow Up Recommendations  SNF    Equipment Recommendations  3 in 1 bedside comode    Recommendations for Other Services      Precautions / Restrictions Precautions Precautions: Fall Precaution Comments: monitor O2, HR, BP       Mobility Bed Mobility Overal bed mobility: Needs Assistance Bed Mobility: Supine to Sit     Supine to sit: Min guard Sit to supine: Min assist   General bed mobility comments: min a to lift LEs onto bed   Transfers Overall transfer level: Needs assistance Equipment used: None Transfers: Sit to/from [facility] Sit to Stand: Min guard Stand pivot transfers: Min guard       General transfer comment: min guard for safety     Balance Overall balance assessment: Needs assistance Sitting-balance support: Feet supported Sitting balance-Leahy Scale: Good     Standing balance support: During functional activity Standing balance-Leahy Scale: Fair                     ADL Overall ADL's : Needs assistance/impaired     Grooming: Wash/dry hands;Oral care;Wash/dry face;Min guard;Standing                   Toilet Transfer: Min  guard;Stand-pivot;BSC   Toileting- Water quality scientist and Hygiene: Min guard;Sit to/from stand       Functional mobility during ADLs: Min guard General ADL Comments: 02 sats decreased to mid 80s on 6L supplemental 02.  HR elevated, but unable to determine how high HR actually got due to artifact, but pt with A-Fib and SVT with activity       Vision                     Perception     Praxis      Cognition   Behavior During Therapy: [facility] for tasks assessed/performed Overall Cognitive Status: Within Functional Limits for tasks assessed                       Extremity/Trunk Assessment               Exercises     Shoulder Instructions       General Comments      Pertinent Vitals/ Pain       Pain Assessment: No/denies pain  Home Living                                          Prior Functioning/Environment  Frequency Min 3X/week     Progress Toward Goals  OT Goals(current goals can now be found in the care plan section)  Progress towards OT goals: Progressing toward goals (slowly )  ADL Goals Pt Will Perform Grooming: with modified independence;standing Pt Will Perform Upper Body Bathing: with modified independence;sitting Pt Will Perform Lower Body Bathing: with modified independence;with adaptive equipment;sit to/from stand Pt Will Transfer to Toilet: with modified independence;ambulating;bedside commode Pt Will Perform Toileting - Clothing Manipulation and hygiene: with modified independence;sitting/lateral leans;sit to/from stand Additional ADL Goal #1: Pt will utilize 2 energy conservation strategies during ADL tasks with min verbal cues.  Plan Discharge plan needs to be updated    Co-evaluation                 End of Session Equipment Utilized During Treatment: Oxygen   Activity Tolerance Treatment limited secondary to medical complications (Comment)   Patient Left in bed;with call  bell/phone within reach;with bed alarm set;with nursing/sitter in room   Nurse Communication Mobility status        Time: [id number] OT Time Calculation (min): 33 min  Charges: OT General Charges $OT Visit: 1 Procedure OT Treatments $Self Care/Home Management : 23-37 mins  Shannon [name] M 01/12/2016, 6:24 PM

## 2016-01-12 NOTE — Progress Notes (Signed)
Pt started c/o "feeling like Im choking".  Pt doesn't seem distressed.  Respiratory rhythm as previously noted.  CVP 14; pt has had adequate urine output.  Lungs have fine crackles ant/post bases which were not noted before.  Gave 025 xanax for anxiety and notified Amy Klegg who ordered extra dose of 40mg  iv lasix.

## 2016-01-12 NOTE — Progress Notes (Signed)
ANTICOAGULATION CONSULT NOTE - Follow Up Consult  Pharmacy Consult for Heparin/warfarin Indication: atrial fibrillation  Labs:  Recent Labs  01/10/16 0440 01/11/16 0508 01/11/16 1415 01/12/16 0429 01/12/16 0900  HGB 11.1* 11.5*  --  11.1*  --   HCT 35.8* 35.9*  --  35.7*  --   PLT 187 203  --  199  --   LABPROT 21.2* 22.2*  --  22.7*  --   INR 1.84* 1.96*  --  2.02*  --   HEPARINUNFRC 0.43 0.22* 0.58  --   --   CREATININE 0.85 0.86  --  0.82 0.91    Estimated Creatinine Clearance: 41.5 mL/min (by C-G formula based on Cr of 0.91).   Assessment: 82yo F continuing on heparin and warfarin for afib. Warfarin PTA restarted post cath on 7/7.  Heparin off yesterday post cardioversion. INR at goal 2.0 this am. No bleeding issues noted.  PTA warfarin dose: 1.25 mg daily  Goal of Therapy:  INR 2-3 Heparin level 0.3-0.7 units/ml  Monitor platelets by anticoagulation protocol: Yes   Plan:  Warfarin 3 mg again tonight Daily INR  Erin Hearing PharmD., BCPS Clinical Pharmacist Pager [phone] 01/12/2016 11:14 AM

## 2016-01-12 NOTE — Progress Notes (Signed)
Attempted to order [employer] [id number] for pts unna boots.  SPD rep says do not have in stock and will check with her supervisor to have product available tomorrow.

## 2016-01-12 NOTE — Progress Notes (Signed)
Called Dr Radford Pax, pt hr 19 sbp 90s, will hold tonights dose lopressor per md

## 2016-01-12 NOTE — Progress Notes (Signed)
PROGRESS NOTE    Shannon [name]  [id number] DOB: 05-25-1932 DOA: 12/30/2015 PCP: Haywood Pao, MD   Brief Narrative:  Shannon [name] is an 82 y.o. female with CAD status post stenting, CHF, paroxysmal atrial fibrillation, diabetes mellitus type 2, hyperlipidemia was brought to the ER after patient was complaining of abdominal pain and shortness of breath.. On exam patient also has bilateral lower extremity edema and wound of the left great toe. Chest x-ray shows features concerning for pneumonia and BNP is elevated. Patient at this time will be admitted for acute respiratory failure probably from CHF and pneumonia. Patient underwent cardiac cath, showing stable CAD. Patient was transferred to stepdown due to worsening shortness of breath. 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 Plan for DCCV.   Assessment & Plan   Acute respiratory failure with hypoxia/Acute systolic CHF -Improving slowly -Likely multi-factorial including heart failure and possible pneumonia vs amiodarone toxicity.  -Continue IV lasix  -Chest x-ray:Repeat CXR shows slight increase in the interstitial edema. It was followed with a CT chest showing cylindrical bronchiectasis and patchy densities throughout the majority of the both lungs most Pronounced in the upper lobes. There is moderate to large sized right pleural effusion and moderate sized left pleural effusion.  -Echocardiogram 04/07/2015 showed an EF of 50-55%, and repeat Echocardiogram 01/01/2016: EF 20-25%, diffuse hypokinesis, Severe TR with severely elevated pulmonary pressure.  -Cardiology/ pulmonology/ EP consulted and on board.  -Amiodarone was discontinued on 7/10.  -Although ddimer elevated, unlikely PE, as patient's INR has been supratherapeutic on admission. -LE doppler negative for DVT or SVT -monitor renal parameters while on IV lasix and replete potassium as needed.   Abdominal pain -Resolved.  -CT abdomen  and pelvis: small to moderate volume ascites, cholelithiasis, negative for bowel obstruction, perforation or ambulatory change. Bilateral pleural effusions, right greater than left. -Continue to monitor, pain control as needed.   Coronary artery disease -Denies chest pain -Troponin mildly elevated but trending downward (0.1, 0.07, 0.06, 0.05) -Continue aspirin, statin, metoprolol.  -underwent cardiac cath on 7/7 showed widely patent stents. Minimal CAD in LAD. Mild to mod pulm hypertension.   Atrial fibrillation, chronic -CHADSVASC 4 (age, gender, DM) -Continue metoprolol (increased by Cardiology), digoxin (level 0.6) -Continue warfarin, heparin discontinued -INR 2.02 -EP consulted -S/p DCCV, currently in sinus rhythm  Hypotension/bradycardia, mild -Losartan and metoprolol decreased  Left great toe diabetic wound -Wound care consulted and resume local wound care.  -No surrounding cellulitis.   Diabetes mellitus, type II -Metformin held, continued insulin sliding scale and CBG monitoring  Hyperlipidemia -Continue statin  Anemia of chronic disease.  -Continue iron supplementation -Baseline hemoglobin around 10, currently 11.1  Hypokalemia/hypomagnesemia -Secondary to diuretics -Continue to monitor and replace as needed  Goals of care -Palliative care consulted and appreciated  DVT Prophylaxis  Coumadin  Code Status: Full  Family Communication: None at bedside  Disposition Plan: Admitted. Continue diuresis  Consultants Cardiology, EP, Heart failure Pulmonology Palliative care  Procedures  Echocardiogram LE doppler Cardiac Catheterization DCCV  Antibiotics   Anti-infectives    Start     Dose/Rate Route Frequency Ordered Stop   01/09/16 0900  vancomycin (VANCOCIN) 500 mg in sodium chloride 0.9 % 100 mL IVPB  Status:  Discontinued     500 mg 100 mL/hr over 60 Minutes Intravenous Every 12 hours 01/08/16 2103 01/10/16 1355   01/08/16 2115  ceFEPIme  (MAXIPIME) 2 g in dextrose 5 % 50 mL IVPB  Status:  Discontinued  2 g 100 mL/hr over 30 Minutes Intravenous Every 24 hours 01/08/16 2103 01/10/16 1355   01/08/16 2115  vancomycin (VANCOCIN) 1,500 mg in sodium chloride 0.9 % 500 mL IVPB     1,500 mg 250 mL/hr over 120 Minutes Intravenous  Once 01/08/16 2103 01/08/16 2347   01/02/16 1300  cefTRIAXone (ROCEPHIN) 2 g in dextrose 5 % 50 mL IVPB  Status:  Discontinued     2 g 100 mL/hr over 30 Minutes Intravenous Every 24 hours 01/02/16 1156 01/06/16 1808   01/01/16 0500  vancomycin (VANCOCIN) IVPB 1000 mg/200 mL premix  Status:  Discontinued     1,000 mg 200 mL/hr over 60 Minutes Intravenous Every 24 hours 12/31/15 0440 01/02/16 1156   12/31/15 1400  aztreonam (AZACTAM) 1 g in dextrose 5 % 50 mL IVPB  Status:  Discontinued     1 g 100 mL/hr over 30 Minutes Intravenous Every 8 hours 12/31/15 0440 01/02/16 1156   12/31/15 0600  aztreonam (AZACTAM) 2 g in dextrose 5 % 50 mL IVPB  Status:  Discontinued     2 g 100 mL/hr over 30 Minutes Intravenous Every 8 hours 12/31/15 0428 12/31/15 0435   12/31/15 0130  aztreonam (AZACTAM) 1 g in dextrose 5 % 50 mL IVPB     1 g 100 mL/hr over 30 Minutes Intravenous  Once 12/31/15 0121 12/31/15 0338   12/31/15 0130  vancomycin (VANCOCIN) IVPB 1000 mg/200 mL premix     1,000 mg 200 mL/hr over 60 Minutes Intravenous NOW 12/31/15 0122 12/31/15 0247      Subjective:   [name] seen and examined today. Has no complaints today. Feels breathing has improved. Denies chest pain, dizziness, headache, abdominal pain, nausea, vomiting.   Objective:   Filed Vitals:   01/12/16 0700 01/12/16 0800 01/12/16 0833 01/12/16 0851  BP: 85/67 91/67    Pulse: 56 56  59  Temp:   97.7 F (36.5 C)   TempSrc:   Oral   Resp: 10 18    Height:      Weight:      SpO2: 95% 97%      Intake/Output Summary (Last 24 hours) at 01/12/16 1002 Last data filed at 01/12/16 0100  Gross per 24 hour  Intake 578.23 ml  Output    1720 ml  Net -1141.77 ml   Filed Weights   01/10/16 0400 01/11/16 0419 01/12/16 0335  Weight: 67.5 kg (148 lb 13 oz) 66.1 kg (145 lb 11.6 oz) 62.6 kg (138 lb 0.1 oz)    Exam  General: Well developed, well nourished, NAD, appears stated age  HEENT: NCAT, mucous membranes moist.   Neck: Supple, + JVD, no masses  Cardiovascular: S1 S2 auscultated, regular, 3/6SEM  Respiratory: Bibasilar crackles.   Abdomen: Soft, nontender, nondistended, + bowel sounds  Extremities: warm dry without cyanosis clubbing. LE edema, currently wrapped. Left great toe wound  Neuro: AAOx3, nonfocal  Psych: Normal affect and demeanor   Data Reviewed: I have personally reviewed following labs and imaging studies  CBC:  Recent Labs Lab 01/08/16 0338 01/09/16 0548 01/10/16 0440 01/11/16 0508 01/12/16 0429  WBC 8.9 7.7 8.1 8.5 7.5  HGB 10.9* 10.8* 11.1* 11.5* 11.1*  HCT 36.1 34.2* 35.8* 35.9* 35.7*  MCV 105.9* 104.3* 104.1* 103.2* 104.4*  PLT 185 184 187 203 [id number]   Basic Metabolic Panel:  Recent Labs Lab 01/09/16 0548 01/10/16 0440 01/11/16 0508 01/12/16 0429 01/12/16 0900  NA 136 137 136 135 136  K 3.7 3.9  4.4 4.8 4.2  CL 98* 95* 94* 93* 92*  CO2 29 32 35* 33* 35*  GLUCOSE 114* 84 112* 139* 135*  BUN 22* 19 18 18 17   CREATININE 0.86 0.85 0.86 0.82 0.91  CALCIUM 8.4* 8.6* 8.7* 8.9 9.0   GFR: Estimated Creatinine Clearance: 41.5 mL/min (by C-G formula based on Cr of 0.91). Liver Function Tests: No results for input(s): AST, ALT, ALKPHOS, BILITOT, PROT, ALBUMIN in the last 168 hours. No results for input(s): LIPASE, AMYLASE in the last 168 hours. No results for input(s): AMMONIA in the last 168 hours. Coagulation Profile:  Recent Labs Lab 01/08/16 0338 01/09/16 0549 01/10/16 0440 01/11/16 0508 01/12/16 0429  INR 1.66* 1.73* 1.84* 1.96* 2.02*   Cardiac Enzymes: No results for input(s): CKTOTAL, CKMB, CKMBINDEX, TROPONINI in the last 168 hours. BNP (last 3 results) No  results for input(s): PROBNP in the last 8760 hours. HbA1C: No results for input(s): HGBA1C in the last 72 hours. CBG:  Recent Labs Lab 01/11/16 0740 01/11/16 1409 01/11/16 1526 01/11/16 2137 01/12/16 0830  GLUCAP 97 113* 108* 154* 118*   Lipid Profile: No results for input(s): CHOL, HDL, LDLCALC, TRIG, CHOLHDL, LDLDIRECT in the last 72 hours. Thyroid Function Tests: No results for input(s): TSH, T4TOTAL, FREET4, T3FREE, THYROIDAB in the last 72 hours. Anemia Panel: No results for input(s): VITAMINB12, FOLATE, FERRITIN, TIBC, IRON, RETICCTPCT in the last 72 hours. Urine analysis:    Component Value Date/Time   COLORURINE YELLOW 12/30/2015 2320   APPEARANCEUR CLOUDY* 12/30/2015 2320   LABSPEC 1.020 12/30/2015 2320   PHURINE 5.0 12/30/2015 2320   GLUCOSEU NEGATIVE 12/30/2015 2320   HGBUR NEGATIVE 12/30/2015 2320   BILIRUBINUR NEGATIVE 12/30/2015 2320   KETONESUR NEGATIVE 12/30/2015 2320   PROTEINUR 30* 12/30/2015 2320   UROBILINOGEN 0.2 10/09/2012 1551   NITRITE NEGATIVE 12/30/2015 2320   LEUKOCYTESUR SMALL* 12/30/2015 2320   Sepsis Labs: @LABRCNTIP (procalcitonin:4,lacticidven:4)  ) Recent Results (from the past 240 hour(s))  MRSA PCR Screening     Status: Abnormal   Collection Time: 01/09/16  3:03 AM  Result Value Ref Range Status   MRSA by PCR POSITIVE (A) NEGATIVE Final    Comment:        The GeneXpert MRSA Assay (FDA approved for NASAL specimens only), is one component of a comprehensive MRSA colonization surveillance program. It is not intended to diagnose MRSA infection nor to guide or monitor treatment for MRSA infections. RESULT CALLED TO, READ BACK BY AND VERIFIED WITH: Sherry Ruffing RN AT [id number] 01/09/16 BY Rush Landmark       Radiology Studies: Dg Chest Port 1 View  01/11/2016  CLINICAL DATA:  Acute respiratory failure, acute and chronic CHF, atrial fibrillation EXAM: PORTABLE CHEST 1 VIEW COMPARISON:  Portable chest x-ray of January 09, 2016 FINDINGS: The  lungs oral adequately inflated. Confluent interstitial and alveolar opacities persist bilaterally. There are bilateral pleural effusions greater on the right than on the left. The cardiac silhouette is mildly enlarged. There is tortuosity of the ascending and descending thoracic aorta. There is calcification in the wall of the aortic arch. The PICC line tip projects over the mid to distal SVC. IMPRESSION: Persistent confluent interstitial and alveolar opacities consistent with pneumonia with coexisting pulmonary edema. Stable bilateral pleural effusions greater on the right than on the left. Aortic atherosclerosis. Electronically Signed   By: David  Martinique M.D.   On: 01/11/2016 07:21     Scheduled Meds: . atorvastatin  20 mg Oral Daily  . Chlorhexidine Gluconate Cloth  6 each Topical Q0600  . digoxin  0.125 mg Oral Daily  . ferrous sulfate  325 mg Oral Q breakfast  . furosemide  80 mg Intravenous BID  . insulin aspart  0-9 Units Subcutaneous TID WC  . LORazepam  0.5 mg Intravenous Once  . losartan  12.5 mg Oral QPM  . metoprolol succinate  12.5 mg Oral BID  . mupirocin ointment   Nasal BID  . potassium chloride  20 mEq Oral TID  . ranolazine  500 mg Oral BID  . saccharomyces boulardii  250 mg Oral BID  . sodium chloride flush  10-40 mL Intracatheter Q12H  . sodium chloride flush  3 mL Intravenous Q12H  . sodium chloride flush  3 mL Intravenous Q12H  . sodium chloride flush  3 mL Intravenous Q12H  . spironolactone  12.5 mg Oral Daily  . Warfarin - Pharmacist Dosing Inpatient   Does not apply q1800   Continuous Infusions: . sodium chloride 10 mL (01/10/16 1426)     LOS: 12 days   Time Spent in minutes   45 minutes  Kayo Zion D.O. on 01/12/2016 at 10:02 AM  Between 7am to 7pm - Pager - [phone]  After 7pm go to [email or web address] - password [email or web address]  And look for the night coverage person covering for me after hours  [facility] Office  [phone]

## 2016-01-12 NOTE — Progress Notes (Signed)
Patient ID: Shannon [name], female   DOB: 10-Oct-1931, 82 y.o.   MRN: [id number]    Advanced Heart Failure Rounding Note   Subjective:    Admitted with chest pain and dyspnea. Diuresed with IV lasix. EF down from 50-55% in 2016 to 20-25% this admit. Cath as noted below ok.   Had been in A fib with uncontrolled rate so amio increased to 200 mg twice a day( has been on amio since 2016)  and toprol-xl was increased to 50 mg twice a day. Amio stopped on 7/10 due to concern for amio toxicity and ongoing dyspnea and IV lasix increased with improved urine output noted.   On 7/12, she underwent DCCV to NSR.   She was able to walk a little after cardioversion yesterday, says breathing is better.  CVP still 18 this morning however.  Co-ox 53% yesterday afternoon.  Good UOP with Lasix again yesterday.  HR in 50s this morning, SBP 90s-100s.   RHC/LHC 01/06/16 Minimal obstructive CAD Patent Stent OM1 RA 13 PA Mean 31 PCWP 16 CO/CI 3.96/2/36 PVR 3.8   ECHO 01/01/16 EF 20-25% Mild MR, dilated left atrium, TR severe, Peak PA pressure 63 mm hg, small pericardial effusion.   ECHO 04/2015 EF 50-55% moderate TR RA moderately Objective:   Weight Range:  Vital Signs:   Temp:  [97.5 F (36.4 C)-98.3 F (36.8 C)] 97.7 F (36.5 C) (07/13 0335) Pulse Rate:  [54-107] 57 (07/13 0335) Resp:  [12-30] 15 (07/13 0335) BP: (85-137)/(52-82) 88/67 mmHg (07/13 0335) SpO2:  [88 %-100 %] 98 % (07/13 0335) FiO2 (%):  [40 %] 40 % (07/12 2324) Weight:  [138 lb 0.1 oz (62.6 kg)] 138 lb 0.1 oz (62.6 kg) (07/13 0335) Last BM Date: 01/11/16  Weight change: Filed Weights   01/10/16 0400 01/11/16 0419 01/12/16 0335  Weight: 148 lb 13 oz (67.5 kg) 145 lb 11.6 oz (66.1 kg) 138 lb 0.1 oz (62.6 kg)    Intake/Output:   Intake/Output Summary (Last 24 hours) at 01/12/16 0749 Last data filed at 01/12/16 0100  Gross per 24 hour  Intake 624.07 ml  Output   3070 ml  Net -2445.93 ml     Physical Exam: CVP  18 General:  Elderly. Chronically ill appearing. No resp difficulty. In bed.  HEENT: normal Neck: supple. JVP to jaw. Carotids 2+ bilat; no bruits. No lymphadenopathy or thryomegaly appreciated. Cor: PMI nondisplaced. Regular rate & rhythm. No rubs, gallops. 3/6 HSM LLSB.  Lungs: Crackles in the bases. On 6 liters oxygen  Abdomen: soft, nontender, nondistended. No hepatosplenomegaly. No bruits or masses. Good bowel sounds. Extremities: no cyanosis, clubbing, rash, R and LLE coban wrap.  RUE PICC.  Neuro: alert & orientedx3, cranial nerves grossly intact. moves all 4 extremities w/o difficulty. Affect pleasant  Telemetry:  NSR 50s  Labs: Basic Metabolic Panel:  Recent Labs Lab 01/08/16 0338 01/09/16 0548 01/10/16 0440 01/11/16 0508 01/12/16 0429  NA 135 136 137 136 135  K 3.5 3.7 3.9 4.4 4.8  CL 99* 98* 95* 94* 93*  CO2 28 29 32 35* 33*  GLUCOSE 121* 114* 84 112* 139*  BUN 21* 22* 19 18 18   CREATININE 0.76 0.86 0.85 0.86 0.82  CALCIUM 8.5* 8.4* 8.6* 8.7* 8.9    Liver Function Tests: No results for input(s): AST, ALT, ALKPHOS, BILITOT, PROT, ALBUMIN in the last 168 hours. No results for input(s): LIPASE, AMYLASE in the last 168 hours. No results for input(s): AMMONIA in the last 168 hours.  CBC:  Recent Labs Lab 01/08/16 0338 01/09/16 0548 01/10/16 0440 01/11/16 0508 01/12/16 0429  WBC 8.9 7.7 8.1 8.5 7.5  HGB 10.9* 10.8* 11.1* 11.5* 11.1*  HCT 36.1 34.2* 35.8* 35.9* 35.7*  MCV 105.9* 104.3* 104.1* 103.2* 104.4*  PLT 185 184 187 203 199    Cardiac Enzymes: No results for input(s): CKTOTAL, CKMB, CKMBINDEX, TROPONINI in the last 168 hours.  BNP: BNP (last 3 results)  Recent Labs  12/30/15 2327 01/07/16 1038  BNP 593.0* 734.1*    ProBNP (last 3 results) No results for input(s): PROBNP in the last 8760 hours.    Other results:  Imaging: Dg Chest Port 1 View  01/11/2016  CLINICAL DATA:  Acute respiratory failure, acute and chronic CHF, atrial  fibrillation EXAM: PORTABLE CHEST 1 VIEW COMPARISON:  Portable chest x-ray of January 09, 2016 FINDINGS: The lungs oral adequately inflated. Confluent interstitial and alveolar opacities persist bilaterally. There are bilateral pleural effusions greater on the right than on the left. The cardiac silhouette is mildly enlarged. There is tortuosity of the ascending and descending thoracic aorta. There is calcification in the wall of the aortic arch. The PICC line tip projects over the mid to distal SVC. IMPRESSION: Persistent confluent interstitial and alveolar opacities consistent with pneumonia with coexisting pulmonary edema. Stable bilateral pleural effusions greater on the right than on the left. Aortic atherosclerosis. Electronically Signed   By: David  Martinique M.D.   On: 01/11/2016 07:21     Medications:     Scheduled Medications: . atorvastatin  20 mg Oral Daily  . Chlorhexidine Gluconate Cloth  6 each Topical Q0600  . digoxin  0.125 mg Oral Daily  . ferrous sulfate  325 mg Oral Q breakfast  . furosemide  80 mg Intravenous BID  . insulin aspart  0-9 Units Subcutaneous TID WC  . LORazepam  0.5 mg Intravenous Once  . losartan  12.5 mg Oral QPM  . metoprolol succinate  12.5 mg Oral BID  . mupirocin ointment   Nasal BID  . potassium chloride  20 mEq Oral TID  . saccharomyces boulardii  250 mg Oral BID  . sodium chloride flush  10-40 mL Intracatheter Q12H  . sodium chloride flush  3 mL Intravenous Q12H  . sodium chloride flush  3 mL Intravenous Q12H  . sodium chloride flush  3 mL Intravenous Q12H  . spironolactone  12.5 mg Oral Daily  . Warfarin - Pharmacist Dosing Inpatient   Does not apply q1800    Infusions: . sodium chloride 10 mL (01/10/16 1426)    PRN Medications: sodium chloride, acetaminophen, ALPRAZolam, benzonatate, HYDROcodone-acetaminophen, midazolam, nitroGLYCERIN, ondansetron **OR** ondansetron (ZOFRAN) IV, promethazine, sodium chloride flush, sodium chloride flush, sodium  chloride flush   Assessment/Plan/Discussion   82 yo with new-onset nonischemic cardiomyopathy and atrial fibrillation with RVR.   1. Atrial fibrillation: Appears chronic since 8/16. HR appears to have been poorly controlled. 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  Now in NSR s/p DCCV on 7/12. - Pulmonary has seen, chest CT showed bronchiectasis/fibrosis and patchy bilateral densities. I discussed this with Dr Lake Bells again today, he recommends staying off amiodarone given concern for pulmonary toxicity. - I am concerned that she may not hold NSR without some form of rhythm control (and concerned that atrial fibrillation/RVR triggered a tachy-mediated CMP).  No amiodarone as above.  No Tikosyn yet given the long-acting nature of amiodarone and need to wash  out.  I will add ranolazine 500 mg bid.  - Rate control has been difficult when in atrial fibrillation.  If afib/RVR recurs, will need to consider AV nodal ablation/BiV pacing given significant fall in EF with worry for tachy-mediated CMP.  - Stop heparin with therapeutic INR on coumadin.   2. Acute systolic CHF: New fall in EF to 20-25%, nonischemic cardiomyopathy with no obstructive disease (patent OM stent). Also with severe TR. I am concerned that there is a component of tachycardia-mediated cardiomyopathy. RHC showed preserved cardiac output but co-ox low at 53% yesterday.  CVP remains 18. She diuresed well with Lasix yesterday, weight down.  - Continue Lasix 80 mg IV bid today.  - BP lower, will decrease losartan to 12.5 mg daily.  - HR in 50s, will decrease Toprol XL to 12.5 mg bid.  - Continue digoxin, level ok today.  - Continue spironolactone.  - Repeat co-ox this morning.  Would hold off on milrinone at this point with difficult-to-control atrial fibrillation.   3. Severe TR 4. CAD: Nonobstructive on cath this admission.  5. ID: Antibiotics  stopped as PNA unlikely.  6. Pleural effusions: Suspect transudative, she refused thoracentesis.   [name] 01/12/2016 7:49 AM

## 2016-01-12 NOTE — Progress Notes (Signed)
[facility]  Case discussed with cardiology.  Given likely fibrotic changes on her CT chest, I would not restart amiodarone.  I do think she should follow up with our office regarding her effusion and fibrosis.  I have made a follow up appointment with our office: Shannon Lis NP on July 27 at [place], MD [place] [facility] Pager: [phone] Cell: [phone] After 3pm or if no response, call [phone]

## 2016-01-13 DIAGNOSIS — Z515 Encounter for palliative care: Secondary | ICD-10-CM | POA: Insufficient documentation

## 2016-01-13 DIAGNOSIS — I480 Paroxysmal atrial fibrillation: Secondary | ICD-10-CM

## 2016-01-13 DIAGNOSIS — Z7189 Other specified counseling: Secondary | ICD-10-CM | POA: Insufficient documentation

## 2016-01-13 DIAGNOSIS — I214 Non-ST elevation (NSTEMI) myocardial infarction: Secondary | ICD-10-CM

## 2016-01-13 LAB — CBC
HCT: 34.6 % — ABNORMAL LOW (ref 36.0–46.0)
Hemoglobin: 10.7 g/dL — ABNORMAL LOW (ref 12.0–15.0)
MCH: 31.9 pg (ref 26.0–34.0)
MCHC: 30.9 g/dL (ref 30.0–36.0)
MCV: 103.3 fL — ABNORMAL HIGH (ref 78.0–100.0)
PLATELETS: 181 10*3/uL (ref 150–400)
RBC: 3.35 MIL/uL — ABNORMAL LOW (ref 3.87–5.11)
RDW: 17.5 % — AB (ref 11.5–15.5)
WBC: 6.5 10*3/uL (ref 4.0–10.5)

## 2016-01-13 LAB — GLUCOSE, CAPILLARY
GLUCOSE-CAPILLARY: 199 mg/dL — AB (ref 65–99)
Glucose-Capillary: 106 mg/dL — ABNORMAL HIGH (ref 65–99)
Glucose-Capillary: 150 mg/dL — ABNORMAL HIGH (ref 65–99)
Glucose-Capillary: 134 mg/dL — ABNORMAL HIGH (ref 65–99)

## 2016-01-13 LAB — BASIC METABOLIC PANEL
ANION GAP: 10 (ref 5–15)
BUN: 16 mg/dL (ref 6–20)
CALCIUM: 9.2 mg/dL (ref 8.9–10.3)
CO2: 36 mmol/L — AB (ref 22–32)
CREATININE: 0.88 mg/dL (ref 0.44–1.00)
Chloride: 88 mmol/L — ABNORMAL LOW (ref 101–111)
GFR, EST NON AFRICAN AMERICAN: 60 mL/min — AB (ref >60)
Glucose, Bld: 114 mg/dL — ABNORMAL HIGH (ref 65–99)
Potassium: 4.6 mmol/L (ref 3.5–5.1)
SODIUM: 134 mmol/L — AB (ref 135–145)

## 2016-01-13 LAB — PROTIME-INR
INR: 2.28 — AB (ref 0.00–1.49)
PROTHROMBIN TIME: 24.9 s — AB (ref 11.6–15.2)

## 2016-01-13 LAB — CARBOXYHEMOGLOBIN
Carboxyhemoglobin: 1.6 % — ABNORMAL HIGH (ref 0.5–1.5)
Methemoglobin: 0.6 % (ref 0.0–1.5)
O2 Saturation: 53.5 %
TOTAL HEMOGLOBIN: 12.1 g/dL (ref 12.0–16.0)

## 2016-01-13 LAB — PROCALCITONIN: Procalcitonin: 0.16 ng/mL

## 2016-01-13 MED ORDER — METOPROLOL SUCCINATE ER 25 MG PO TB24: 12.5000 mg | ORAL_TABLET | Freq: Every day | ORAL | Status: DC

## 2016-01-13 MED ORDER — WARFARIN SODIUM 2.5 MG PO TABS
1.2500 mg | ORAL_TABLET | Freq: Once | ORAL | Status: AC
  Administered 2016-01-13: 1.25 mg via ORAL

## 2016-01-13 NOTE — Progress Notes (Signed)
PROGRESS NOTE    Shannon [name]  [id number] DOB: Feb 19, 1932 DOA: 12/30/2015 PCP: Haywood Pao, MD   Brief Narrative:  Shannon [name] is an 82 y.o. female with CAD status post stenting, CHF, paroxysmal atrial fibrillation, diabetes mellitus type 2, hyperlipidemia was brought to the ER after patient was complaining of abdominal pain and shortness of breath.. On exam patient also has bilateral lower extremity edema and wound of the left great toe. Chest x-ray shows features concerning for pneumonia and BNP is elevated. Patient at this time will be admitted for acute respiratory failure probably from CHF and pneumonia. Patient underwent cardiac cath, showing stable CAD. Patient was transferred to stepdown due to worsening shortness of breath. 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 Plan for DCCV.   Assessment & Plan   Acute respiratory failure with hypoxia/Acute systolic CHF -Improving slowly, currently needing 5-6L O2 -Likely multi-factorial including heart failure and possible pneumonia vs amiodarone toxicity.  -Continue IV lasix  -Chest x-ray:Repeat CXR shows slight increase in the interstitial edema. It was followed with a CT chest showing cylindrical bronchiectasis and patchy densities throughout the majority of the both lungs most Pronounced in the upper lobes. There is moderate to large sized right pleural effusion and moderate sized left pleural effusion.  -Echocardiogram 04/07/2015 showed an EF of 50-55%, and repeat Echocardiogram 01/01/2016: EF 20-25%, diffuse hypokinesis, Severe TR with severely elevated pulmonary pressure.  -Cardiology/ pulmonology/ EP consulted and on board.  -Amiodarone was discontinued on 7/10.  -Although ddimer elevated, unlikely PE, as patient's INR has been supratherapeutic on admission. -LE doppler negative for DVT or SVT -monitor renal parameters while on IV lasix and replete potassium as needed.  -Metoprolol  held for now given low BP. Continue spironolactone, losartan, lasix  Abdominal pain -Resolved.  -CT abdomen and pelvis: small to moderate volume ascites, cholelithiasis, negative for bowel obstruction, perforation or ambulatory change. Bilateral pleural effusions, right greater than left. -Continue to monitor, pain control as needed.   Coronary artery disease -Denies chest pain -Troponin mildly elevated but trending downward (0.1, 0.07, 0.06, 0.05) -Continue aspirin, statin, metoprolol.  -underwent cardiac cath on 7/7 showed widely patent stents. Minimal CAD in LAD. Mild to mod pulm hypertension.   Atrial fibrillation, chronic -CHADSVASC 4 (age, gender, DM) -Continue digoxin (level 0.6), Ranexa -metoprolol held for now given low BP -Continue warfarin, heparin discontinued -INR 2.02 -EP consulted -S/p DCCV, currently in sinus rhythm  Hypotension/bradycardia, mild -Losartan and metoprolol decreased  Left great toe diabetic wound -Wound care consulted and resume local wound care.  -No surrounding cellulitis.   Diabetes mellitus, type II -Metformin held, continued insulin sliding scale and CBG monitoring  Hyperlipidemia -Continue statin  Anemia of chronic disease.  -Continue iron supplementation -Baseline hemoglobin around 10, currently 11.1  Hypokalemia/hypomagnesemia -Secondary to diuretics -Continue to monitor and replace as needed  Goals of care -Palliative care consulted and appreciated  DVT Prophylaxis  Coumadin  Code Status: Full  Family Communication: None at bedside  Disposition Plan: Admitted. Continue diuresis per cardiology.   Consultants Cardiology, EP, Heart failure Pulmonology Palliative care  Procedures  Echocardiogram LE doppler Cardiac Catheterization DCCV  Antibiotics   Anti-infectives    Start     Dose/Rate Route Frequency Ordered Stop   01/09/16 0900  vancomycin (VANCOCIN) 500 mg in sodium chloride 0.9 % 100 mL IVPB  Status:   Discontinued     500 mg 100 mL/hr over 60 Minutes Intravenous Every 12 hours 01/08/16 2103 01/10/16 1355  01/08/16 2115  ceFEPIme (MAXIPIME) 2 g in dextrose 5 % 50 mL IVPB  Status:  Discontinued     2 g 100 mL/hr over 30 Minutes Intravenous Every 24 hours 01/08/16 2103 01/10/16 1355   01/08/16 2115  vancomycin (VANCOCIN) 1,500 mg in sodium chloride 0.9 % 500 mL IVPB     1,500 mg 250 mL/hr over 120 Minutes Intravenous  Once 01/08/16 2103 01/08/16 2347   01/02/16 1300  cefTRIAXone (ROCEPHIN) 2 g in dextrose 5 % 50 mL IVPB  Status:  Discontinued     2 g 100 mL/hr over 30 Minutes Intravenous Every 24 hours 01/02/16 1156 01/06/16 1808   01/01/16 0500  vancomycin (VANCOCIN) IVPB 1000 mg/200 mL premix  Status:  Discontinued     1,000 mg 200 mL/hr over 60 Minutes Intravenous Every 24 hours 12/31/15 0440 01/02/16 1156   12/31/15 1400  aztreonam (AZACTAM) 1 g in dextrose 5 % 50 mL IVPB  Status:  Discontinued     1 g 100 mL/hr over 30 Minutes Intravenous Every 8 hours 12/31/15 0440 01/02/16 1156   12/31/15 0600  aztreonam (AZACTAM) 2 g in dextrose 5 % 50 mL IVPB  Status:  Discontinued     2 g 100 mL/hr over 30 Minutes Intravenous Every 8 hours 12/31/15 0428 12/31/15 0435   12/31/15 0130  aztreonam (AZACTAM) 1 g in dextrose 5 % 50 mL IVPB     1 g 100 mL/hr over 30 Minutes Intravenous  Once 12/31/15 0121 12/31/15 0338   12/31/15 0130  vancomycin (VANCOCIN) IVPB 1000 mg/200 mL premix     1,000 mg 200 mL/hr over 60 Minutes Intravenous NOW 12/31/15 0122 12/31/15 0247      Subjective:   Shannon [name] seen and examined today. Has no complaints today and is feeling better.  Denies chest pain, dizziness, headache, abdominal pain, nausea, vomiting.   Objective:   Filed Vitals:   01/13/16 1020 01/13/16 1025 01/13/16 1030 01/13/16 1100  BP: 96/54 83/56 89/62  97/54  Pulse: 49 57 58 53  Temp:      TempSrc:      Resp: 14 15 22 15   Height:      Weight:      SpO2: 95% 96% 96% 100%     Intake/Output Summary (Last 24 hours) at 01/13/16 1141 Last data filed at 01/13/16 1125  Gross per 24 hour  Intake    120 ml  Output   2500 ml  Net  -2380 ml   Filed Weights   01/11/16 0419 01/12/16 0335 01/13/16 0349  Weight: 66.1 kg (145 lb 11.6 oz) 62.6 kg (138 lb 0.1 oz) 59.8 kg (131 lb 13.4 oz)    Exam  General: Well developed, well nourished, NAD, appears stated age  HEENT: NCAT, mucous membranes moist.   Neck: Supple, + JVD, no masses  Cardiovascular: S1 S2 auscultated, regular, 3/6SEM  Respiratory: Bibasilar crackles.   Abdomen: Soft, nontender, nondistended, + bowel sounds  Extremities: warm dry without cyanosis clubbing. LE edema, currently wrapped. Left great toe wound  Neuro: AAOx3, nonfocal  Psych: Normal affect and demeanor, pleasant  Data Reviewed: I have personally reviewed following labs and imaging studies  CBC:  Recent Labs Lab 01/09/16 0548 01/10/16 0440 01/11/16 0508 01/12/16 0429 01/13/16 0420  WBC 7.7 8.1 8.5 7.5 6.5  HGB 10.8* 11.1* 11.5* 11.1* 10.7*  HCT 34.2* 35.8* 35.9* 35.7* 34.6*  MCV 104.3* 104.1* 103.2* 104.4* 103.3*  PLT 184 187 203 199 0000000   Basic Metabolic Panel:  Recent Labs Lab 01/10/16 0440 01/11/16 0508 01/12/16 0429 01/12/16 0900 01/13/16 0420  NA 137 136 135 136 134*  K 3.9 4.4 4.8 4.2 4.6  CL 95* 94* 93* 92* 88*  CO2 32 35* 33* 35* 36*  GLUCOSE 84 112* 139* 135* 114*  BUN 19 18 18 17 16   CREATININE 0.85 0.86 0.82 0.91 0.88  CALCIUM 8.6* 8.7* 8.9 9.0 9.2   GFR: Estimated Creatinine Clearance: 39 mL/min (by C-G formula based on Cr of 0.88). Liver Function Tests: No results for input(s): AST, ALT, ALKPHOS, BILITOT, PROT, ALBUMIN in the last 168 hours. No results for input(s): LIPASE, AMYLASE in the last 168 hours. No results for input(s): AMMONIA in the last 168 hours. Coagulation Profile:  Recent Labs Lab 01/09/16 0549 01/10/16 0440 01/11/16 0508 01/12/16 0429 01/13/16 0420  INR 1.73* 1.84*  1.96* 2.02* 2.28*   Cardiac Enzymes: No results for input(s): CKTOTAL, CKMB, CKMBINDEX, TROPONINI in the last 168 hours. BNP (last 3 results) No results for input(s): PROBNP in the last 8760 hours. HbA1C: No results for input(s): HGBA1C in the last 72 hours. CBG:  Recent Labs Lab 01/12/16 0830 01/12/16 1149 01/12/16 1558 01/12/16 2142 01/13/16 0737  GLUCAP 118* 157* 138* 145* 106*   Lipid Profile: No results for input(s): CHOL, HDL, LDLCALC, TRIG, CHOLHDL, LDLDIRECT in the last 72 hours. Thyroid Function Tests: No results for input(s): TSH, T4TOTAL, FREET4, T3FREE, THYROIDAB in the last 72 hours. Anemia Panel: No results for input(s): VITAMINB12, FOLATE, FERRITIN, TIBC, IRON, RETICCTPCT in the last 72 hours. Urine analysis:    Component Value Date/Time   COLORURINE YELLOW 12/30/2015 2320   APPEARANCEUR CLOUDY* 12/30/2015 2320   LABSPEC 1.020 12/30/2015 2320   PHURINE 5.0 12/30/2015 2320   GLUCOSEU NEGATIVE 12/30/2015 2320   HGBUR NEGATIVE 12/30/2015 2320   BILIRUBINUR NEGATIVE 12/30/2015 2320   KETONESUR NEGATIVE 12/30/2015 2320   PROTEINUR 30* 12/30/2015 2320   UROBILINOGEN 0.2 10/09/2012 1551   NITRITE NEGATIVE 12/30/2015 2320   LEUKOCYTESUR SMALL* 12/30/2015 2320   Sepsis Labs: @LABRCNTIP (procalcitonin:4,lacticidven:4)  ) Recent Results (from the past 240 hour(s))  MRSA PCR Screening     Status: Abnormal   Collection Time: 01/09/16  3:03 AM  Result Value Ref Range Status   MRSA by PCR POSITIVE (A) NEGATIVE Final    Comment:        The GeneXpert MRSA Assay (FDA approved for NASAL specimens only), is one component of a comprehensive MRSA colonization surveillance program. It is not intended to diagnose MRSA infection nor to guide or monitor treatment for MRSA infections. RESULT CALLED TO, READ BACK BY AND VERIFIED WITH: Sherry Ruffing RN AT [id number] 01/09/16 BY D. Victoriano Lain       Radiology Studies: No results found.   Scheduled Meds: . atorvastatin  20 mg  Oral Daily  . Chlorhexidine Gluconate Cloth  6 each Topical Q0600  . digoxin  0.125 mg Oral Daily  . ferrous sulfate  325 mg Oral Q breakfast  . furosemide  80 mg Intravenous BID  . insulin aspart  0-9 Units Subcutaneous TID WC  . LORazepam  0.5 mg Intravenous Once  . losartan  12.5 mg Oral QPM  . mupirocin ointment   Nasal BID  . potassium chloride  20 mEq Oral TID  . ranolazine  500 mg Oral BID  . saccharomyces boulardii  250 mg Oral BID  . sodium chloride flush  10-40 mL Intracatheter Q12H  . sodium chloride flush  3 mL Intravenous Q12H  . sodium  chloride flush  3 mL Intravenous Q12H  . sodium chloride flush  3 mL Intravenous Q12H  . spironolactone  12.5 mg Oral Daily  . warfarin  1.25 mg Oral ONCE-1800  . Warfarin - Pharmacist Dosing Inpatient   Does not apply q1800   Continuous Infusions: . sodium chloride 10 mL (01/10/16 1426)     LOS: 13 days   Time Spent in minutes   45 minutes  Dao Memmott D.O. on 01/13/2016 at 11:41 AM  Between 7am to 7pm - Pager - [phone]  After 7pm go to [email or web address] - password [email or web address]  And look for the night coverage person covering for me after hours  [facility] Office  [phone]

## 2016-01-13 NOTE — Progress Notes (Signed)
ANTICOAGULATION CONSULT NOTE - Follow Up Consult  Pharmacy Consult for warfarin Indication: atrial fibrillation  Labs:  Recent Labs  01/11/16 0508 01/11/16 1415 01/12/16 0429 01/12/16 0900 01/13/16 0420  HGB 11.5*  --  11.1*  --  10.7*  HCT 35.9*  --  35.7*  --  34.6*  PLT 203  --  199  --  181  LABPROT 22.2*  --  22.7*  --  24.9*  INR 1.96*  --  2.02*  --  2.28*  HEPARINUNFRC 0.22* 0.58  --   --   --   CREATININE 0.86  --  0.82 0.91 0.88    Estimated Creatinine Clearance: 39 mL/min (by C-G formula based on Cr of 0.88).   Assessment: 82yo F continuing on heparin and warfarin for afib. Warfarin PTA restarted post cath on 7/7.  INR at goal 2.2 this am. No bleeding issues noted.  PTA warfarin dose: 1.25 mg daily  Goal of Therapy:  INR 2-3 Heparin level 0.3-0.7 units/ml  Monitor platelets by anticoagulation protocol: Yes   Plan:  Warfarin 1.25 mg tonight Daily INR  Erin Hearing PharmD., BCPS Clinical Pharmacist Pager [phone] 01/13/2016 11:24 AM

## 2016-01-13 NOTE — Clinical Social Work Note (Signed)
Clinical Social Work Assessment  Patient Details  Name: Shannon [name] MRN: [id number] Date of Birth: 07/28/1931  Date of referral:  01/13/16               Reason for consult:  Facility Placement                Permission sought to share information with:  Facility Sport and exercise psychologist, Family Supports Permission granted to share information::  Yes, Verbal Permission Granted  Name::     [facility] [phone]  Agency::  SNF admissions  Relationship::     Contact Information:     Housing/Transportation Living arrangements for the past 2 months:  Single Family Home Source of Information:  Patient, Adult Children Patient Interpreter Needed:  None Criminal Activity/Legal Involvement Pertinent to Current Situation/Hospitalization:  No - Comment as needed Significant Relationships:  Adult Children, Spouse Lives with:  Spouse Do you feel safe going back to the place where you live?  No Need for family participation in patient care:  Yes (Comment) (Patient requesting her family help with decision making.)  Care giving concerns:  Patient's family states patient needs some therapy before she can return back home.   Social Worker assessment / plan:  Patient is an 82 year old female who is married, alert and oriented x4 and lives with her husband.  Patient's family were at bedside and reported that patient has been to rehab in the past.  CSW discussed with patient and her family the process of looking for a bed at SNF.  CSW explained to patient how insurance will cover patient's stay and what to expect at SNF.  Patient and her family were informed about how discharge planning will occur from SNF.  Patient's family were appreciated of information given and did not express any other questions or concerns.  CSW was given permission by patient and her family to begin bed search process in [facility].  Employment status:  Retired Nurse, adult PT  Recommendations:  [place] / Referral to community resources:  [place]  Patient/Family's Response to care:  Patient and family agreeable for SNF placement.  Patient/Family's Understanding of and Emotional Response to Diagnosis, Current Treatment, and Prognosis:  Patient's family expressed that patient has been taking care of her husband in their home who is bed ridden and they are concerned that patient will not be able to continue to take care of her husband once patient has completed rehab.  Patient states she is hopeful to get strong enough from therapy to go to her son's house to continue with home health therapy and then plans to return back home with her husband.  Patient and her family are aware of the current prognosis and treatment plan.  Emotional Assessment Appearance:    Attitude/Demeanor/Rapport:    Affect (typically observed):  Appropriate, Calm Orientation:  Oriented to Place, Oriented to  Time, Oriented to Self, Oriented to Situation Alcohol / Substance use:  Not Applicable Psych involvement (Current and /or in the community):  No (Comment)  Discharge Needs  Concerns to be addressed:  Lack of Support Readmission within the last 30 days:  No Current discharge risk:  Lack of support system, Lives alone Barriers to Discharge:  No Barriers Identified, Continued Medical Work up   Anell Barr 01/13/2016, 2:35 PM

## 2016-01-13 NOTE — [place] FL2 (Signed)
[place] MEDICAID FL2 LEVEL OF CARE SCREENING TOOL     IDENTIFICATION  Patient Name: Shannon [name] Birthdate: [birth year] Sex: female Admission Date (Current Location): 12/30/2015  [facility] and [place] Number:  Herbalist and Address:  [facility] [place], [place] [address] [place], [place] [address]      Provider Number: [id number]  Attending Physician Name and Address:  Cristal Ford, DO  Relative Name and Phone Number:  Berton Mount [phone]    Current Level of Care: Hospital Recommended Level of Care: [place] Prior Approval Number:    Date Approved/Denied:   PASRR Number: [id number]  Discharge Plan: SNF    Current Diagnoses: Patient Active Problem List   Diagnosis Date Noted  . Non-STEMI (non-ST elevated myocardial infarction) ([place])   . Cough   . Dyspnea   . Shortness of breath   . Pleural effusion   . Persistent atrial fibrillation ([place])   . Pressure ulcer 01/09/2016  . Leg swelling   . SOB (shortness of breath)   . Elevated troponin   . Acute on chronic systolic CHF (congestive heart failure) ([place])   . CAD (coronary artery disease)   . Cardiomyopathy ([place])   . Chronic systolic CHF (congestive heart failure) ([place])   . Acute respiratory failure with hypoxia ([place]) 12/31/2015  . Abdominal pain 12/31/2015  . Long term (current) use of anticoagulants [Z79.01] 10/24/2015  . Foot ulcer, left ([place])   . Chronic venous stasis dermatitis 10/09/2015  . Cellulitis of toe, left great 10/09/2015  . Chronic atrial fibrillation ([place]) 01/31/2015  . Edema 01/31/2015  . Atrial flutter ([place]) 10/09/2012  . UTI (urinary tract infection) 10/09/2012  . MVP (mitral valve prolapse)   . Hyperlipidemia   . Diabetes mellitus ([place])     Orientation RESPIRATION BLADDER Height & Weight     Self, Time, Situation, Place  O2 (4 L per day) Continent Weight: 131 lb 13.4 oz (59.8 kg) Height:  5\' 2"  (157.5 cm)  BEHAVIORAL  SYMPTOMS/MOOD NEUROLOGICAL BOWEL NUTRITION STATUS      Continent Diet (Carb Modified)  AMBULATORY STATUS COMMUNICATION OF NEEDS Skin   Limited Assist Verbally PU Stage and Appropriate Care, Surgical wounds PU Stage 1 Dressing: Daily                     Personal Care Assistance Level of Assistance  Bathing, Dressing Bathing Assistance: Limited assistance   Dressing Assistance: Limited assistance     Functional Limitations Info  Sight, Hearing, Speech Sight Info: Adequate Hearing Info: Adequate Speech Info: Adequate    SPECIAL CARE FACTORS FREQUENCY  PT (By licensed PT)  OT (By licensed OT)   PT Frequency: minumum 3x a week   OT Frequency: minumum 3x a week        Contractures Contractures Info: Not present    Additional Factors Info  Insulin Sliding Scale, Code Status, Allergies Code Status Info: Full Code Allergies Info: DEMEROL, ERYTHROMYCIN, PENICILLINS, PERCOCET   Insulin Sliding Scale Info: 3x a day with meals       Current Medications (01/13/2016):  This is the current hospital active medication list Current Facility-Administered Medications  Medication Dose Route Frequency Provider Last Rate Last Dose  . 0.9 %  sodium chloride infusion  250 mL Intravenous PRN Belva Crome, MD      . 0.9 %  sodium chloride infusion  250 mL Intravenous Continuous Amy D Clegg, NP 1 mL/hr at 01/10/16 1426 10 mL  at 01/10/16 1426  . acetaminophen (TYLENOL) tablet 650 mg  650 mg Oral Q4H PRN Belva Crome, MD      . ALPRAZolam Duanne Moron) tablet 0.25 mg  0.25 mg Oral Daily PRN Hosie Poisson, MD   0.25 mg at 01/12/16 1441  . atorvastatin (LIPITOR) tablet 20 mg  20 mg Oral Daily Rise Patience, MD   20 mg at 01/13/16 0942  . benzonatate (TESSALON) capsule 200 mg  200 mg Oral TID PRN Hosie Poisson, MD      . Chlorhexidine Gluconate Cloth 2 % PADS 6 each  6 each Topical Q0600 Hosie Poisson, MD   6 each at 01/13/16 0420  . digoxin (LANOXIN) tablet 0.125 mg  0.125 mg Oral Daily Amy D  Clegg, NP   0.125 mg at 01/13/16 0941  . ferrous sulfate tablet 325 mg  325 mg Oral Q breakfast Rise Patience, MD   325 mg at 01/13/16 0942  . furosemide (LASIX) injection 80 mg  80 mg Intravenous BID Conrad Friendship, NP   80 mg at 01/13/16 0942  . HYDROcodone-acetaminophen (NORCO/VICODIN) 5-325 MG per tablet 0.5-1 tablet  0.5-1 tablet Oral Q4H PRN Rise Patience, MD   1 tablet at 01/12/16 0341  . insulin aspart (novoLOG) injection 0-9 Units  0-9 Units Subcutaneous TID WC Maryann Mikhail, DO   1 Units at 01/13/16 1310  . LORazepam (ATIVAN) injection 0.5 mg  0.5 mg Intravenous Once [employer], DO   0.5 mg at 01/02/16 1930  . losartan (COZAAR) tablet 12.5 mg  12.5 mg Oral QPM Larey Dresser, MD   12.5 mg at 01/12/16 1751  . midazolam (VERSED) injection 0.5-2 mg  0.5-2 mg Intravenous Once PRN Annye Asa, MD      . mupirocin ointment (BACTROBAN) 2 %   Nasal BID Hosie Poisson, MD      . nitroGLYCERIN (NITROSTAT) SL tablet 0.4 mg  0.4 mg Sublingual Q5 Min x 3 PRN Rise Patience, MD      . ondansetron [facility]) tablet 4 mg  4 mg Oral Q6H PRN Rise Patience, MD       Or  . ondansetron [facility]) injection 4 mg  4 mg Intravenous Q6H PRN Rise Patience, MD      . potassium chloride SA (K-DUR,KLOR-CON) CR tablet 20 mEq  20 mEq Oral TID Evelene Croon Barrett, PA-C   20 mEq at 01/13/16 0941  . promethazine (PHENERGAN) injection 6.25-12.5 mg  6.25-12.5 mg Intravenous Q15 min PRN Annye Asa, MD      . ranolazine (RANEXA) 12 hr tablet 500 mg  500 mg Oral BID Larey Dresser, MD   500 mg at 01/13/16 0942  . saccharomyces boulardii (FLORASTOR) capsule 250 mg  250 mg Oral BID Rise Patience, MD   250 mg at 01/13/16 0942  . sodium chloride flush (NS) 0.9 % injection 10-40 mL  10-40 mL Intracatheter Q12H Hosie Poisson, MD   10 mL at 01/12/16 0854  . sodium chloride flush (NS) 0.9 % injection 10-40 mL  10-40 mL Intracatheter PRN Hosie Poisson, MD      . sodium chloride flush (NS) 0.9 %  injection 3 mL  3 mL Intravenous Q12H Rise Patience, MD   3 mL at 01/13/16 0943  . sodium chloride flush (NS) 0.9 % injection 3 mL  3 mL Intravenous Q12H Belva Crome, MD   10 mL at 01/11/16 2139  . sodium chloride flush (NS) 0.9 % injection 3  mL  3 mL Intravenous PRN Belva Crome, MD   3 mL at 01/10/16 0925  . sodium chloride flush (NS) 0.9 % injection 3 mL  3 mL Intravenous Q12H Amy D Clegg, NP   3 mL at 01/11/16 0946  . sodium chloride flush (NS) 0.9 % injection 3 mL  3 mL Intravenous PRN Amy D Clegg, NP      . spironolactone (ALDACTONE) tablet 12.5 mg  12.5 mg Oral Daily Larey Dresser, MD   12.5 mg at 01/13/16 0942  . warfarin (COUMADIN) tablet 1.25 mg  1.25 mg Oral ONCE-1800 Lyndee Leo, [facility]      . Warfarin - Pharmacist Dosing Inpatient   Does not apply [place], [facility]   0  at 01/06/16 [id number]     Discharge Medications: Please see discharge summary for a list of discharge medications.  Relevant Imaging Results:  Relevant Lab Results:   Additional Information SSN [id number]  Ross Ludwig, [place]

## 2016-01-13 NOTE — Progress Notes (Signed)
Patient ID: Shannon [name], female   DOB: [birth year], 82 y.o.   MRN: [id number]    Advanced Heart Failure Rounding Note   Subjective:    Admitted with chest pain and dyspnea. Diuresed with IV lasix. EF down from 50-55% in 2016 to 20-25% this admit. Cath as noted below ok.   Had been in A fib with uncontrolled rate so amio increased to 200 mg twice a day( has been on amio since 2016)  and toprol-xl was increased to 50 mg twice a day. Amio stopped on 7/10 due to concern for amio toxicity and ongoing dyspnea and IV lasix increased with improved urine output noted.   On 7/12, she underwent DCCV to NSR.   CVP 15 today, co-ox 54%.  SBP in 90s for the most part.  Still very weak when working with PT.  Remains in NSR.  Diuresed well yesterday, weight down again.  Breathing better.   RHC/LHC 01/06/16 Minimal obstructive CAD Patent Stent OM1 RA 13 PA Mean 31 PCWP 16 CO/CI 3.96/2/36 PVR 3.8   ECHO 01/01/16 EF 20-25% Mild MR, dilated left atrium, TR severe, Peak PA pressure 63 mm hg, small pericardial effusion.   ECHO 04/2015 EF 50-55% moderate TR RA moderately Objective:   Weight Range:  Vital Signs:   Temp:  [97.4 F (36.3 C)-98.4 F (36.9 C)] 97.9 F (36.6 C) (07/14 0741) Pulse Rate:  [53-64] 55 (07/14 0800) Resp:  [11-21] 15 (07/14 0800) BP: (79-103)/(52-71) 87/58 mmHg (07/14 0800) SpO2:  [90 %-100 %] 94 % (07/14 0800) Weight:  [131 lb 13.4 oz (59.8 kg)] 131 lb 13.4 oz (59.8 kg) (07/14 0349) Last BM Date: 01/11/16  Weight change: Filed Weights   01/11/16 0419 01/12/16 0335 01/13/16 0349  Weight: 145 lb 11.6 oz (66.1 kg) 138 lb 0.1 oz (62.6 kg) 131 lb 13.4 oz (59.8 kg)    Intake/Output:   Intake/Output Summary (Last 24 hours) at 01/13/16 0839 Last data filed at 01/13/16 0300  Gross per 24 hour  Intake    240 ml  Output   2125 ml  Net  -1885 ml     Physical Exam: CVP 15 General:  Elderly. Chronically ill appearing. No resp difficulty. In bed.  HEENT: normal Neck:  supple. JVP to jaw with prominent CV waves. Carotids 2+ bilat; no bruits. No lymphadenopathy or thryomegaly appreciated. Cor: PMI nondisplaced. Regular rate & rhythm. No rubs, gallops. 3/6 HSM LLSB.  Lungs: Crackles in the bases. On 6 liters oxygen  Abdomen: soft, nontender, nondistended. No hepatosplenomegaly. No bruits or masses. Good bowel sounds. Extremities: no cyanosis, clubbing, rash.  R and LLE coban wrap.  RUE PICC.  Neuro: alert & orientedx3, cranial nerves grossly intact. moves all 4 extremities w/o difficulty. Affect pleasant  Telemetry:  NSR 50s  Labs: Basic Metabolic Panel:  Recent Labs Lab 01/10/16 0440 01/11/16 0508 01/12/16 0429 01/12/16 0900 01/13/16 0420  NA 137 136 135 136 134*  K 3.9 4.4 4.8 4.2 4.6  CL 95* 94* 93* 92* 88*  CO2 32 35* 33* 35* 36*  GLUCOSE 84 112* 139* 135* 114*  BUN 19 18 18 17 16   CREATININE 0.85 0.86 0.82 0.91 0.88  CALCIUM 8.6* 8.7* 8.9 9.0 9.2    Liver Function Tests: No results for input(s): AST, ALT, ALKPHOS, BILITOT, PROT, ALBUMIN in the last 168 hours. No results for input(s): LIPASE, AMYLASE in the last 168 hours. No results for input(s): AMMONIA in the last 168 hours.  CBC:  Recent Labs Lab 01/09/16  [id number] 01/10/16 0440 01/11/16 0508 01/12/16 0429 01/13/16 0420  WBC 7.7 8.1 8.5 7.5 6.5  HGB 10.8* 11.1* 11.5* 11.1* 10.7*  HCT 34.2* 35.8* 35.9* 35.7* 34.6*  MCV 104.3* 104.1* 103.2* 104.4* 103.3*  PLT 184 187 203 199 181    Cardiac Enzymes: No results for input(s): CKTOTAL, CKMB, CKMBINDEX, TROPONINI in the last 168 hours.  BNP: BNP (last 3 results)  Recent Labs  12/30/15 2327 01/07/16 1038  BNP 593.0* 734.1*    ProBNP (last 3 results) No results for input(s): PROBNP in the last 8760 hours.    Other results:  Imaging: No results found.   Medications:     Scheduled Medications: . atorvastatin  20 mg Oral Daily  . Chlorhexidine Gluconate Cloth  6 each Topical Q0600  . digoxin  0.125 mg Oral Daily    . ferrous sulfate  325 mg Oral Q breakfast  . furosemide  80 mg Intravenous BID  . insulin aspart  0-9 Units Subcutaneous TID WC  . LORazepam  0.5 mg Intravenous Once  . losartan  12.5 mg Oral QPM  . mupirocin ointment   Nasal BID  . potassium chloride  20 mEq Oral TID  . ranolazine  500 mg Oral BID  . saccharomyces boulardii  250 mg Oral BID  . sodium chloride flush  10-40 mL Intracatheter Q12H  . sodium chloride flush  3 mL Intravenous Q12H  . sodium chloride flush  3 mL Intravenous Q12H  . sodium chloride flush  3 mL Intravenous Q12H  . spironolactone  12.5 mg Oral Daily  . Warfarin - Pharmacist Dosing Inpatient   Does not apply q1800    Infusions: . sodium chloride 10 mL (01/10/16 1426)    PRN Medications: sodium chloride, acetaminophen, ALPRAZolam, benzonatate, HYDROcodone-acetaminophen, midazolam, nitroGLYCERIN, ondansetron **OR** ondansetron (ZOFRAN) IV, promethazine, sodium chloride flush, sodium chloride flush, sodium chloride flush   Assessment/Plan/Discussion   82 yo with new-onset nonischemic cardiomyopathy and atrial fibrillation with RVR.   1. Atrial fibrillation: Appears chronic since 8/16. HR appears to have been poorly controlled. 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  Now in NSR s/p DCCV on 7/12. - Pulmonary has seen, chest CT showed bronchiectasis/fibrosis and patchy bilateral densities. I discussed this with Dr Lake Bells, he recommends staying off amiodarone given concern for pulmonary toxicity. - I am concerned that she may not hold NSR without some form of rhythm control (and concerned that atrial fibrillation/RVR triggered a tachy-mediated CMP).  No amiodarone as above.  No Tikosyn yet given the long-acting nature of amiodarone and need to wash out.  I started ranolazine 500 mg bid. Will get ECG on ranolazine to follow QT.  - Rate control has been difficult when in atrial  fibrillation.  If afib/RVR recurs, will need to consider AV nodal ablation/BiV pacing given significant fall in EF with worry for tachy-mediated CMP.  - INR therapeutic on warfarin.    2. Acute systolic CHF: New fall in EF to 20-25%, nonischemic cardiomyopathy with no obstructive disease (patent OM stent). Also with severe TR. I am concerned that there is a component of tachycardia-mediated cardiomyopathy. RHC showed preserved cardiac output but co-ox low at 54% today.  CVP remains 15. She diuresed well with Lasix yesterday, weight down again but still volume overloaded.  BP soft.  - Continue Lasix 80 mg IV bid today.  - Continue losartan to 12.5 mg daily.  - HR in 24s with  low BP, stop Toprol XL for now.  - Continue digoxin, level ok.  - Continue spironolactone.  - Though co-ox marginal,  would hold off on milrinone at this point with history of difficult-to-control atrial fibrillation.   3. Severe TR 4. CAD: Nonobstructive on cath this admission.  5. ID: Antibiotics stopped as PNA unlikely.  6. Pleural effusions: Suspect transudative, she refused thoracentesis.  7. Deconditioning: Continue to work with PT, will need SNF.   [name] 01/13/2016 8:39 AM

## 2016-01-13 NOTE — Progress Notes (Signed)
Physical Therapy Treatment Patient Details Name: Shannon [name] MRN: [id number] DOB: [birth year] Today's Date: 01/13/2016    History of Present Illness Shannon [name] is a 82 y.o. female with CAD status post stenting, CHF, paroxysmal atrial fibrillation, diabetes mellitus type 2, hyperlipidemia who was admitted due to acute hypoxic respiratory faiilure with new cardiomyopathy with acute on chronic CHF.  Pt had cardioversion on 7/12 with success to sinur rhythm.    PT Comments    [name]'s BP 80s-90s/50s throughout session, pt symptomatic with postural changes but denies symptoms at end of session.  Pt c/o new lateral Lt thigh pain that she first noticed when she woke up this morning, please see general notes below for more details. Provided pt min assist to pivot to chair today.  Pt will benefit from continued skilled PT services to increase functional independence and safety.   Follow Up Recommendations  SNF (many need to consider SNF if no assist available)     Equipment Recommendations  Other (comment) (toilet riser)    Recommendations for Other Services       Precautions / Restrictions Precautions Precautions: Fall Precaution Comments: monitor O2, HR, BP Restrictions Weight Bearing Restrictions: No    Mobility  Bed Mobility Overal bed mobility: Needs Assistance Bed Mobility: Supine to Sit     Supine to sit: Min guard;HOB elevated Sit to supine: Min guard   General bed mobility comments: Increased time and effort. Use of bed rail.  Transfers Overall transfer level: Needs assistance Equipment used: None Transfers: Sit to/from [facility] Sit to Stand: Min assist Stand pivot transfers: Min assist       General transfer comment: Min assist for balance with HHA to turn and pivot to chair.  Pt reports her legs feel weak.  Ambulation/Gait             General Gait Details: deferred due to low BP, see general comments  below   Stairs            Wheelchair Mobility    Modified Rankin (Stroke Patients Only)       Balance Overall balance assessment: Needs assistance Sitting-balance support: No upper extremity supported;Feet supported Sitting balance-Leahy Scale: Good (Although pt visibly fatiguing sitting EOB)     Standing balance support: Single extremity supported;During functional activity Standing balance-Leahy Scale: Poor Standing balance comment: Relies on HHA for support                    Cognition Arousal/Alertness: Awake/alert Behavior During Therapy: WFL for tasks assessed/performed Overall Cognitive Status: Within Functional Limits for tasks assessed                      Exercises General Exercises - Lower Extremity Ankle Circles/Pumps: AROM;Both;10 reps;Supine Quad Sets: Strengthening;Both;5 reps;Supine Straight Leg Raises: Right;10 reps;Supine;Other (comment) (unable on Lt due to pain)    General Comments General comments (skin integrity, edema, etc.): BP supine at start of session: 88/59, sitting EOB 96/54, standing at bedside 92/50, sitting in chair at end of session 83/56.  Pt symptomatic with transitions but denies symptoms at end of session.  SpO2 drops as low as 84% with activity but rebounds to 90s once resting.  HR remains in 50s throughout session.  Pt c/o Lt lateral thigh pain that is aching.  It began this morning when she woke up, pt reports she slept on her back.  Tender to palpation over IT Band region, could be developing tightness  for decreased mobility?  Not warm to touch, not red.  RN notified.        Pertinent Vitals/Pain Pain Assessment: 0-10 Pain Score: 7  Pain Location: lateral Lt thigh Pain Descriptors / Indicators: Aching Pain Intervention(s): Limited activity within patient's tolerance;Monitored during session;Repositioned;Other (comment) (notified RN)    Home Living                      Prior Function             PT Goals (current goals can now be found in the care plan section) Acute Rehab PT Goals Patient Stated Goal: to get stronger PT Goal Formulation: With patient Time For Goal Achievement: 01/24/16 Potential to Achieve Goals: Good Progress towards PT goals: Not progressing toward goals - comment (due to hypotension)    Frequency  Min 3X/week    PT Plan Current plan remains appropriate    Co-evaluation             End of Session Equipment Utilized During Treatment: Oxygen;Gait belt Activity Tolerance: Patient limited by fatigue;Patient tolerated treatment well;Treatment limited secondary to medical complications (Comment);Patient limited by pain (hypotension) Patient left: with call bell/phone within reach;in chair;with chair alarm set     Time: [id number] PT Time Calculation (min) (ACUTE ONLY): 26 min  Charges:  $Therapeutic Exercise: 8-22 mins $Therapeutic Activity: 8-22 mins                    G Codes:      Collie Siad PT, DPT  Pager: [phone] Phone: [phone] 01/13/2016, 10:58 AM

## 2016-01-13 NOTE — Progress Notes (Signed)
Orthopedic Tech Progress Note Patient Details:  Shannon [name] 25-Apr-1932 [id number]  Ortho Devices Type of Ortho Device: Louretta Parma boot Ortho Device/Splint Location: bilateral Ortho Device/Splint Interventions: Application   [name] 01/13/2016, 3:20 PM

## 2016-01-13 NOTE — Progress Notes (Signed)
CARDIAC REHAB PHASE I   Pt just finished application of unna boots (per NT she slept through most of this). Pt very sleepy, BP remains somewhat low. Pt unable to participate in education at this time. Will follow up for education/ambulation.    Lenna Sciara, RN, BSN 01/13/2016 3:16 PM

## 2016-01-13 NOTE — Progress Notes (Signed)
OT Cancellation Note  Patient Details Name: Shannon [name] MRN: [id number] DOB: 05-31-1932   Cancelled Treatment:    Reason Eval/Treat Not Completed: Fatigue/lethargy limiting ability to participate - pt sleeping soundly, and unable to adequately arouse to participate  Darlina Rumpf [place], OTR/L [id number]  01/13/2016, 5:16 PM

## 2016-01-13 NOTE — Clinical Social Work Placement (Addendum)
   CLINICAL SOCIAL WORK PLACEMENT  NOTE  Date:  01/13/2016  Patient Details  Name: Shannon [name] MRN: [id number] Date of Birth: [birth year]  Clinical Social Work is seeking post-discharge placement for this patient at the [place] level of care (*CSW will initial, date and re-position this form in  chart as items are completed):  Yes   Patient/family provided with [facility] Work Department's list of facilities offering this level of care within the geographic area requested by the patient (or if unable, by the patient's family).  Yes   Patient/family informed of their freedom to choose among providers that offer the needed level of care, that participate in Medicare, Medicaid or managed care program needed by the patient, have an available bed and are willing to accept the patient.  Yes   Patient/family informed of [facility]'s ownership interest in [facility] and [facility], as well as of the fact that they are under no obligation to receive care at these facilities.  PASRR submitted to EDS on 01/13/16     PASRR number received on 01/13/16     Existing PASRR number confirmed on       FL2 transmitted to all facilities in geographic area requested by pt/family on 01/13/16     FL2 transmitted to all facilities within larger geographic area on       Patient informed that his/her managed care company has contracts with or will negotiate with certain facilities, including the following:         01-15-16   Patient/family informed of bed offers received. (Updated Evette Cristal, MSW, [place], 01-16-16)  Patient chooses bed at  [facility] (Updated Evette Cristal, MSW, [place], 01-16-16)     Physician recommends and patient chooses bed at      Patient to be transferred to  [facility] on  01-16-16 (Updated Evette Cristal, MSW, [place], 01-16-16).  Patient to be transferred to facility by  [facility] (Updated Evette Cristal, MSW, [place], 01-16-16)      Patient family notified on  01-16-16 of transfer. (Updated Evette Cristal, MSW, [place], 01-16-16)  Name of family member notified:   [name] (Updated Evette Cristal, MSW, [place], 01-16-16)     PHYSICIAN Please sign FL2     Additional Comment:    _______________________________________________ Ross Ludwig, LCSWA 01/13/2016, 2:59 PM   (Updated Evette Cristal, MSW, [place], 01-16-16)

## 2016-01-13 NOTE — Progress Notes (Signed)
Pt delay in BiPAP (20:00) by own request until desat episode -SpO2 86

## 2016-01-14 ENCOUNTER — Encounter (HOSPITAL_COMMUNITY): Payer: Self-pay

## 2016-01-14 LAB — BASIC METABOLIC PANEL
ANION GAP: 6 (ref 5–15)
BUN: 18 mg/dL (ref 6–20)
CALCIUM: 9.2 mg/dL (ref 8.9–10.3)
CO2: 39 mmol/L — ABNORMAL HIGH (ref 22–32)
Chloride: 90 mmol/L — ABNORMAL LOW (ref 101–111)
Creatinine, Ser: 0.96 mg/dL (ref 0.44–1.00)
GFR, EST NON AFRICAN AMERICAN: 54 mL/min — AB (ref >60)
GLUCOSE: 109 mg/dL — AB (ref 65–99)
POTASSIUM: 5.2 mmol/L — AB (ref 3.5–5.1)
SODIUM: 135 mmol/L (ref 135–145)

## 2016-01-14 LAB — CBC
HCT: 33.7 % — ABNORMAL LOW (ref 36.0–46.0)
Hemoglobin: 10.7 g/dL — ABNORMAL LOW (ref 12.0–15.0)
MCH: 32.6 pg (ref 26.0–34.0)
MCHC: 31.8 g/dL (ref 30.0–36.0)
MCV: 102.7 fL — AB (ref 78.0–100.0)
PLATELETS: 183 10*3/uL (ref 150–400)
RBC: 3.28 MIL/uL — AB (ref 3.87–5.11)
RDW: 17.4 % — AB (ref 11.5–15.5)
WBC: 6.8 10*3/uL (ref 4.0–10.5)

## 2016-01-14 LAB — GLUCOSE, CAPILLARY
GLUCOSE-CAPILLARY: 97 mg/dL (ref 65–99)
GLUCOSE-CAPILLARY: 128 mg/dL — AB (ref 65–99)
GLUCOSE-CAPILLARY: 135 mg/dL — AB (ref 65–99)
Glucose-Capillary: 115 mg/dL — ABNORMAL HIGH (ref 65–99)

## 2016-01-14 LAB — PROTIME-INR
INR: 2.74 — AB (ref 0.00–1.49)
Prothrombin Time: 28.6 seconds — ABNORMAL HIGH (ref 11.6–15.2)

## 2016-01-14 MED ORDER — FUROSEMIDE 40 MG PO TABS
40.0000 mg | ORAL_TABLET | Freq: Two times a day (BID) | ORAL | Status: DC
  Administered 2016-01-15: 40 mg via ORAL
  Filled 2016-01-14: qty 1

## 2016-01-14 MED ORDER — WARFARIN 0.5 MG HALF TABLET
0.5000 mg | ORAL_TABLET | Freq: Once | ORAL | Status: AC
  Administered 2016-01-14: 0.5 mg via ORAL
  Filled 2016-01-14 (×2): qty 1

## 2016-01-14 NOTE — Progress Notes (Signed)
CARDIAC REHAB PHASE I   PRE:  Rate/Rhythm: 52 SB  BP:  Supine: 93/55  Sitting:   Standing:    SaO2: 99% 4L  MODE:  Ambulation: door ft   POST:  Rate/Rhythm: 59 SB  BP:  Supine:   Sitting: 98/54  Standing:    SaO2: 95%4L 1224-1257 Pt walked to door and to recliner with asst x 2 on 4L oxygen with gait belt use and rolling walker. Tired by end of walk. Call bell in reach and chair alarm on. Will keep as asst x 2 due to equipment. Will benefit from PT also.   Graylon Good, RN BSN  01/14/2016 12:52 PM

## 2016-01-14 NOTE — Progress Notes (Signed)
Patient refusing CPAP at this time. RT made pt aware that if she changed her mind to call. RT will continue to monitor. Pt was resting comfortably until woken. SPO2 100% RR 14.

## 2016-01-14 NOTE — Progress Notes (Signed)
ANTICOAGULATION CONSULT NOTE - Follow Up Consult  Pharmacy Consult for warfarin Indication: atrial fibrillation  Labs:  Recent Labs  01/11/16 1415  01/12/16 0429 01/12/16 0900 01/13/16 0420 01/14/16 0250  HGB  --   < > 11.1*  --  10.7* 10.7*  HCT  --   --  35.7*  --  34.6* 33.7*  PLT  --   --  199  --  181 183  LABPROT  --   --  22.7*  --  24.9* 28.6*  INR  --   --  2.02*  --  2.28* 2.74*  HEPARINUNFRC 0.58  --   --   --   --   --   CREATININE  --   < > 0.82 0.91 0.88 0.96  < > = values in this interval not displayed.  Estimated Creatinine Clearance: 35.7 mL/min (by C-G formula based on Cr of 0.96).   Assessment: 82yo F continuing on warfarin for afib. Warfarin PTA restarted post cath on 7/7.  INR remains at goal 2.74 this am but big jump overnight. Has received multiple increased doses. No bleeding issues noted. CBC remains low but stable.   PTA warfarin dose: 1.25 mg daily  Goal of Therapy:  INR 2-3 Monitor platelets by anticoagulation protocol: Yes   Plan:  Warfarin 0.5 mg tonight Daily INR Monitor CBC and for s/s bleeding   Makinzey Banes K. Velva Harman, PharmD, BCPS, CPP Clinical Pharmacist Pager: [phone] Phone: [phone] 01/14/2016 10:10 AM

## 2016-01-14 NOTE — Progress Notes (Signed)
Patient ID: Shannon [name], female   DOB: [birth year], 82 y.o.   MRN: [id number]    Advanced Heart Failure Rounding Note   Subjective:    Admitted with chest pain and dyspnea. Diuresed with IV lasix. EF down from 50-55% in 2016 to 20-25% this admit. Cath as noted below ok.   Had been in A fib with uncontrolled rate so amio increased to 200 mg twice a day( has been on amio since 2016)  and toprol-xl was increased to 50 mg twice a day. Amio stopped on 7/10 due to concern for amio toxicity and ongoing dyspnea and IV lasix increased with improved urine output noted.   On 7/12, she underwent DCCV to NSR.  Remains in NSR.   CVP 8-9 today.  She diuresed well yesterday, weight now down 34 lbs today.  Creatinine stable.  Still very weak.   RHC/LHC 01/06/16 Minimal obstructive CAD Patent Stent OM1 RA 13 PA Mean 31 PCWP 16 CO/CI 3.96/2/36 PVR 3.8   ECHO 01/01/16 EF 20-25% Mild MR, dilated left atrium, TR severe, Peak PA pressure 63 mm hg, small pericardial effusion.   ECHO 04/2015 EF 50-55% moderate TR RA moderately Objective:   Weight Range:  Vital Signs:   Temp:  [96.6 F (35.9 C)-97.9 F (36.6 C)] 97.9 F (36.6 C) (07/15 0813) Pulse Rate:  [49-68] 54 (07/15 1100) Resp:  [11-20] 17 (07/15 1100) BP: (72-109)/(40-66) 97/58 mmHg (07/15 1100) SpO2:  [90 %-100 %] 99 % (07/15 1100) Weight:  [131 lb 9.8 oz (59.7 kg)] 131 lb 9.8 oz (59.7 kg) (07/15 0342) Last BM Date: 01/13/16  Weight change: Filed Weights   01/12/16 0335 01/13/16 0349 01/14/16 0342  Weight: 138 lb 0.1 oz (62.6 kg) 131 lb 13.4 oz (59.8 kg) 131 lb 9.8 oz (59.7 kg)    Intake/Output:   Intake/Output Summary (Last 24 hours) at 01/14/16 1139 Last data filed at 01/14/16 1120  Gross per 24 hour  Intake    240 ml  Output   3400 ml  Net  -3160 ml     Physical Exam: CVP 8-9 General:  Elderly. Chronically ill appearing. No resp difficulty. In bed.  HEENT: normal Neck: supple. JVP 8-9 cm with prominent CV waves.  Carotids 2+ bilat; no bruits. No lymphadenopathy or thryomegaly appreciated. Cor: PMI nondisplaced. Regular rate & rhythm. No rubs, gallops. 3/6 HSM LLSB.  Lungs: Crackles in the bases. On 6 liters oxygen  Abdomen: soft, nontender, nondistended. No hepatosplenomegaly. No bruits or masses. Good bowel sounds. Extremities: no cyanosis, clubbing, rash.  R and LLE coban wrap.  RUE PICC.  Neuro: alert & orientedx3, cranial nerves grossly intact. moves all 4 extremities w/o difficulty. Affect pleasant  Telemetry:  NSR 50s  Labs: Basic Metabolic Panel:  Recent Labs Lab 01/11/16 0508 01/12/16 0429 01/12/16 0900 01/13/16 0420 01/14/16 0250  NA 136 135 136 134* 135  K 4.4 4.8 4.2 4.6 5.2*  CL 94* 93* 92* 88* 90*  CO2 35* 33* 35* 36* 39*  GLUCOSE 112* 139* 135* 114* 109*  BUN 18 18 17 16 18   CREATININE 0.86 0.82 0.91 0.88 0.96  CALCIUM 8.7* 8.9 9.0 9.2 9.2    Liver Function Tests: No results for input(s): AST, ALT, ALKPHOS, BILITOT, PROT, ALBUMIN in the last 168 hours. No results for input(s): LIPASE, AMYLASE in the last 168 hours. No results for input(s): AMMONIA in the last 168 hours.  CBC:  Recent Labs Lab 01/10/16 0440 01/11/16 [id number] 01/12/16 0429 01/13/16 0420 01/14/16 0250  WBC 8.1 8.5 7.5 6.5 6.8  HGB 11.1* 11.5* 11.1* 10.7* 10.7*  HCT 35.8* 35.9* 35.7* 34.6* 33.7*  MCV 104.1* 103.2* 104.4* 103.3* 102.7*  PLT 187 203 199 181 183    Cardiac Enzymes: No results for input(s): CKTOTAL, CKMB, CKMBINDEX, TROPONINI in the last 168 hours.  BNP: BNP (last 3 results)  Recent Labs  12/30/15 2327 01/07/16 1038  BNP 593.0* 734.1*    ProBNP (last 3 results) No results for input(s): PROBNP in the last 8760 hours.    Other results:  Imaging: No results found.   Medications:     Scheduled Medications: . atorvastatin  20 mg Oral Daily  . Chlorhexidine Gluconate Cloth  6 each Topical Q0600  . digoxin  0.125 mg Oral Daily  . ferrous sulfate  325 mg Oral Q  breakfast  . [START ON 01/15/2016] furosemide  40 mg Oral BID  . insulin aspart  0-9 Units Subcutaneous TID WC  . LORazepam  0.5 mg Intravenous Once  . losartan  12.5 mg Oral QPM  . mupirocin ointment   Nasal BID  . ranolazine  500 mg Oral BID  . saccharomyces boulardii  250 mg Oral BID  . sodium chloride flush  10-40 mL Intracatheter Q12H  . sodium chloride flush  3 mL Intravenous Q12H  . sodium chloride flush  3 mL Intravenous Q12H  . sodium chloride flush  3 mL Intravenous Q12H  . spironolactone  12.5 mg Oral Daily  . warfarin  0.5 mg Oral ONCE-1800  . Warfarin - Pharmacist Dosing Inpatient   Does not apply q1800    Infusions: . sodium chloride 10 mL (01/10/16 1426)    PRN Medications: sodium chloride, acetaminophen, ALPRAZolam, benzonatate, HYDROcodone-acetaminophen, midazolam, nitroGLYCERIN, ondansetron **OR** ondansetron (ZOFRAN) IV, promethazine, sodium chloride flush, sodium chloride flush, sodium chloride flush   Assessment/Plan/Discussion   82 yo with new-onset nonischemic cardiomyopathy and atrial fibrillation with RVR.   1. Atrial fibrillation: Appears chronic since 8/16. HR appears to have been poorly controlled. 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  Now in NSR s/p DCCV on 7/12. - Pulmonary has seen, chest CT showed bronchiectasis/fibrosis and patchy bilateral densities. I discussed this with Dr Lake Bells, he recommends staying off amiodarone given concern for pulmonary toxicity. - I am concerned that she may not hold NSR without some form of rhythm control (and concerned that atrial fibrillation/RVR triggered a tachy-mediated CMP).  No amiodarone as above.  No Tikosyn yet given the long-acting nature of amiodarone and need to wash out.  I started ranolazine 500 mg bid. Will get ECG on ranolazine to follow QT.  - Rate control has been difficult when in atrial fibrillation.  If afib/RVR recurs,  will need to consider AV nodal ablation/BiV pacing given significant fall in EF with worry for tachy-mediated CMP.  - INR therapeutic on warfarin.    2. Acute systolic CHF: New fall in EF to 20-25%, nonischemic cardiomyopathy with no obstructive disease (patent OM stent). Also with severe TR. I am concerned that there is a component of tachycardia-mediated cardiomyopathy. RHC showed preserved cardiac output but co-ox low at 54% today.  CVP remains 15. She diuresed well with Lasix yesterday, CVP now down to 8-9.  Suspect we will not get to totally normal range given severe TR. - Transition to Lasix 40 mg po bid tomorrow. Stop IV Lasix today.  - Continue losartan to 12.5 mg daily.  - HR  in 4s with low BP, now offToprol XL for now.  - Continue digoxin, level ok.  - Continue spironolactone.  - Though co-ox marginal,  would hold off on milrinone at this point with history of difficult-to-control atrial fibrillation.   3. Severe TR 4. CAD: Nonobstructive on cath this admission.  5. ID: Antibiotics stopped as PNA unlikely.  6. Pleural effusions: Suspect transudative, she refused thoracentesis.  7. Deconditioning: Continue to work with PT, will need SNF.   [name] 01/14/2016 11:39 AM

## 2016-01-14 NOTE — Progress Notes (Signed)
PROGRESS NOTE    NAUREEN DRACE  [id number] DOB: 21-Aug-1931 DOA: 12/30/2015 PCP: Haywood Pao, MD   Brief Narrative:  Shannon [name] is an 82 y.o. female with CAD status post stenting, CHF, paroxysmal atrial fibrillation, diabetes mellitus type 2, hyperlipidemia was brought to the ER after patient was complaining of abdominal pain and shortness of breath.. On exam patient also has bilateral lower extremity edema and wound of the left great toe. Chest x-ray shows features concerning for pneumonia and BNP is elevated. Patient at this time will be admitted for acute respiratory failure probably from CHF and pneumonia. Patient underwent cardiac cath, showing stable CAD. Patient was transferred to stepdown due to worsening shortness of breath. 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 Plan for DCCV.   Assessment & Plan   Acute respiratory failure with hypoxia/Acute systolic CHF -Improving slowly, currently needing 5-6L O2 -Likely multi-factorial including heart failure and possible pneumonia vs amiodarone toxicity.  -Continue IV lasix  -Chest x-ray:Repeat CXR shows slight increase in the interstitial edema. It was followed with a CT chest showing cylindrical bronchiectasis and patchy densities throughout the majority of the both lungs most Pronounced in the upper lobes. There is moderate to large sized right pleural effusion and moderate sized left pleural effusion.  -Echocardiogram 04/07/2015 showed an EF of 50-55%, and repeat Echocardiogram 01/01/2016: EF 20-25%, diffuse hypokinesis, Severe TR with severely elevated pulmonary pressure.  -Cardiology/ pulmonology/ EP consulted and on board.  -Amiodarone was discontinued on 7/10.  -Although ddimer elevated, unlikely PE, as patient's INR has been supratherapeutic on admission. -LE doppler negative for DVT or SVT -monitor renal parameters while on IV lasix and replete potassium as needed.  -Metoprolol  held for now given low BP. Continue spironolactone, losartan, lasix -UOP ove past 24hrs 3350cc (net -[id number])  Abdominal pain -Resolved.  -CT abdomen and pelvis: small to moderate volume ascites, cholelithiasis, negative for bowel obstruction, perforation or ambulatory change. Bilateral pleural effusions, right greater than left. -Continue to monitor, pain control as needed.   Coronary artery disease -Denies chest pain -Troponin mildly elevated but trending downward (0.1, 0.07, 0.06, 0.05) -Continue aspirin, statin, metoprolol.  -underwent cardiac cath on 7/7 showed widely patent stents. Minimal CAD in LAD. Mild to mod pulm hypertension.   Atrial fibrillation, chronic -CHADSVASC 4 (age, gender, DM) -Continue digoxin (level 0.6), Ranexa -metoprolol held for now given low BP -Continue warfarin, heparin discontinued -INR 2.74 -EP consulted -S/p DCCV, currently in sinus rhythm  Hypotension/bradycardia, mild -Losartan decreased -metoprolol held  Left great toe diabetic wound -Wound care consulted and resume local wound care.  -No surrounding cellulitis.   Diabetes mellitus, type II -Metformin held, continued insulin sliding scale and CBG monitoring  Hyperlipidemia -Continue statin  Anemia of chronic disease.  -Continue iron supplementation -Baseline hemoglobin around 10, currently 10.7  Hypokalemia/hypomagnesemia   -Secondary to diuretics -Continue to monitor and replace as needed  Hyperkalemia -Potassium 5.2, suspect will improve with diuretics -Will hold potassium supplementation  -Monitor BMP  Goals of care -Palliative care consulted and appreciated  DVT Prophylaxis  Coumadin  Code Status: Full  Family Communication: None at bedside  Disposition Plan: Admitted. Continue diuresis per cardiology.   Consultants Cardiology, EP, Heart failure Pulmonology Palliative care  Procedures  Echocardiogram LE doppler Cardiac Catheterization DCCV  Antibiotics     Anti-infectives    Start     Dose/Rate Route Frequency Ordered Stop   01/09/16 0900  vancomycin (VANCOCIN) 500 mg in sodium chloride 0.9 % 100  mL IVPB  Status:  Discontinued     500 mg 100 mL/hr over 60 Minutes Intravenous Every 12 hours 01/08/16 2103 01/10/16 1355   01/08/16 2115  ceFEPIme (MAXIPIME) 2 g in dextrose 5 % 50 mL IVPB  Status:  Discontinued     2 g 100 mL/hr over 30 Minutes Intravenous Every 24 hours 01/08/16 2103 01/10/16 1355   01/08/16 2115  vancomycin (VANCOCIN) 1,500 mg in sodium chloride 0.9 % 500 mL IVPB     1,500 mg 250 mL/hr over 120 Minutes Intravenous  Once 01/08/16 2103 01/08/16 2347   01/02/16 1300  cefTRIAXone (ROCEPHIN) 2 g in dextrose 5 % 50 mL IVPB  Status:  Discontinued     2 g 100 mL/hr over 30 Minutes Intravenous Every 24 hours 01/02/16 1156 01/06/16 1808   01/01/16 0500  vancomycin (VANCOCIN) IVPB 1000 mg/200 mL premix  Status:  Discontinued     1,000 mg 200 mL/hr over 60 Minutes Intravenous Every 24 hours 12/31/15 0440 01/02/16 1156   12/31/15 1400  aztreonam (AZACTAM) 1 g in dextrose 5 % 50 mL IVPB  Status:  Discontinued     1 g 100 mL/hr over 30 Minutes Intravenous Every 8 hours 12/31/15 0440 01/02/16 1156   12/31/15 0600  aztreonam (AZACTAM) 2 g in dextrose 5 % 50 mL IVPB  Status:  Discontinued     2 g 100 mL/hr over 30 Minutes Intravenous Every 8 hours 12/31/15 0428 12/31/15 0435   12/31/15 0130  aztreonam (AZACTAM) 1 g in dextrose 5 % 50 mL IVPB     1 g 100 mL/hr over 30 Minutes Intravenous  Once 12/31/15 0121 12/31/15 0338   12/31/15 0130  vancomycin (VANCOCIN) IVPB 1000 mg/200 mL premix     1,000 mg 200 mL/hr over 60 Minutes Intravenous NOW 12/31/15 0122 12/31/15 0247      Subjective:   [name] seen and examined today. Has no complaints today. Sleeping better. Feels breathing and leg swelling have improved.   Denies chest pain, dizziness, headache, abdominal pain, nausea, vomiting.   Objective:   Filed Vitals:   01/14/16  0800 01/14/16 0813 01/14/16 0900 01/14/16 0904  BP: 90/47 90/47 102/60   Pulse: 52 52 55 55  Temp:  97.9 F (36.6 C)    TempSrc:  Oral    Resp: 13 11 20    Height:      Weight:      SpO2: 94% 95% 100%     Intake/Output Summary (Last 24 hours) at 01/14/16 1026 Last data filed at 01/14/16 0847  Gross per 24 hour  Intake    240 ml  Output   3250 ml  Net  -3010 ml   Filed Weights   01/12/16 0335 01/13/16 0349 01/14/16 0342  Weight: 62.6 kg (138 lb 0.1 oz) 59.8 kg (131 lb 13.4 oz) 59.7 kg (131 lb 9.8 oz)    Exam  General: Well developed, well nourished, No apparent distress  HEENT: NCAT, mucous membranes moist.   Cardiovascular: S1 S2 auscultated, regular, 3/6SEM  Respiratory: Diminished but clear   Abdomen: Soft, nontender, nondistended, + bowel sounds  Extremities: warm dry without cyanosis clubbing. LE edema, currently wrapped.   Neuro: AAOx3, nonfocal  Psych: Normal affect and demeanor, pleasant  Data Reviewed: I have personally reviewed following labs and imaging studies  CBC:  Recent Labs Lab 01/10/16 0440 01/11/16 0508 01/12/16 0429 01/13/16 0420 01/14/16 0250  WBC 8.1 8.5 7.5 6.5 6.8  HGB 11.1* 11.5* 11.1* 10.7* 10.7*  HCT 35.8* 35.9* 35.7* 34.6* 33.7*  MCV 104.1* 103.2* 104.4* 103.3* 102.7*  PLT 187 203 199 181 [id number]   Basic Metabolic Panel:  Recent Labs Lab 01/11/16 0508 01/12/16 0429 01/12/16 0900 01/13/16 0420 01/14/16 0250  NA 136 135 136 134* 135  K 4.4 4.8 4.2 4.6 5.2*  CL 94* 93* 92* 88* 90*  CO2 35* 33* 35* 36* 39*  GLUCOSE 112* 139* 135* 114* 109*  BUN 18 18 17 16 18   CREATININE 0.86 0.82 0.91 0.88 0.96  CALCIUM 8.7* 8.9 9.0 9.2 9.2   GFR: Estimated Creatinine Clearance: 35.7 mL/min (by C-G formula based on Cr of 0.96). Liver Function Tests: No results for input(s): AST, ALT, ALKPHOS, BILITOT, PROT, ALBUMIN in the last 168 hours. No results for input(s): LIPASE, AMYLASE in the last 168 hours. No results for input(s): AMMONIA  in the last 168 hours. Coagulation Profile:  Recent Labs Lab 01/10/16 0440 01/11/16 0508 01/12/16 0429 01/13/16 0420 01/14/16 0250  INR 1.84* 1.96* 2.02* 2.28* 2.74*   Cardiac Enzymes: No results for input(s): CKTOTAL, CKMB, CKMBINDEX, TROPONINI in the last 168 hours. BNP (last 3 results) No results for input(s): PROBNP in the last 8760 hours. HbA1C: No results for input(s): HGBA1C in the last 72 hours. CBG:  Recent Labs Lab 01/13/16 0737 01/13/16 1213 01/13/16 1740 01/13/16 2123 01/14/16 0818  GLUCAP 106* 150* 134* 199* 97   Lipid Profile: No results for input(s): CHOL, HDL, LDLCALC, TRIG, CHOLHDL, LDLDIRECT in the last 72 hours. Thyroid Function Tests: No results for input(s): TSH, T4TOTAL, FREET4, T3FREE, THYROIDAB in the last 72 hours. Anemia Panel: No results for input(s): VITAMINB12, FOLATE, FERRITIN, TIBC, IRON, RETICCTPCT in the last 72 hours. Urine analysis:    Component Value Date/Time   COLORURINE YELLOW 12/30/2015 2320   APPEARANCEUR CLOUDY* 12/30/2015 2320   LABSPEC 1.020 12/30/2015 2320   PHURINE 5.0 12/30/2015 2320   GLUCOSEU NEGATIVE 12/30/2015 2320   HGBUR NEGATIVE 12/30/2015 2320   BILIRUBINUR NEGATIVE 12/30/2015 2320   KETONESUR NEGATIVE 12/30/2015 2320   PROTEINUR 30* 12/30/2015 2320   UROBILINOGEN 0.2 10/09/2012 1551   NITRITE NEGATIVE 12/30/2015 2320   LEUKOCYTESUR SMALL* 12/30/2015 2320   Sepsis Labs: @LABRCNTIP (procalcitonin:4,lacticidven:4)  ) Recent Results (from the past 240 hour(s))  MRSA PCR Screening     Status: Abnormal   Collection Time: 01/09/16  3:03 AM  Result Value Ref Range Status   MRSA by PCR POSITIVE (A) NEGATIVE Final    Comment:        The GeneXpert MRSA Assay (FDA approved for NASAL specimens only), is one component of a comprehensive MRSA colonization surveillance program. It is not intended to diagnose MRSA infection nor to guide or monitor treatment for MRSA infections. RESULT CALLED TO, READ BACK BY  AND VERIFIED WITH: Sherry Ruffing RN AT [id number] 01/09/16 BY D. Victoriano Lain       Radiology Studies: No results found.   Scheduled Meds: . atorvastatin  20 mg Oral Daily  . Chlorhexidine Gluconate Cloth  6 each Topical Q0600  . digoxin  0.125 mg Oral Daily  . ferrous sulfate  325 mg Oral Q breakfast  . furosemide  80 mg Intravenous BID  . insulin aspart  0-9 Units Subcutaneous TID WC  . LORazepam  0.5 mg Intravenous Once  . losartan  12.5 mg Oral QPM  . mupirocin ointment   Nasal BID  . potassium chloride  20 mEq Oral TID  . ranolazine  500 mg Oral BID  . saccharomyces boulardii  250  mg Oral BID  . sodium chloride flush  10-40 mL Intracatheter Q12H  . sodium chloride flush  3 mL Intravenous Q12H  . sodium chloride flush  3 mL Intravenous Q12H  . sodium chloride flush  3 mL Intravenous Q12H  . spironolactone  12.5 mg Oral Daily  . warfarin  0.5 mg Oral ONCE-1800  . Warfarin - Pharmacist Dosing Inpatient   Does not apply q1800   Continuous Infusions: . sodium chloride 10 mL (01/10/16 1426)     LOS: 14 days   Time Spent in minutes   45 minutes  Ryzen Deady D.O. on 01/14/2016 at 10:26 AM  Between 7am to 7pm - Pager - [phone]  After 7pm go to [email or web address] - password [email or web address]  And look for the night coverage person covering for me after hours  [facility] Office  [phone]

## 2016-01-15 DIAGNOSIS — I5022 Chronic systolic (congestive) heart failure: Secondary | ICD-10-CM

## 2016-01-15 LAB — BASIC METABOLIC PANEL
ANION GAP: 6 (ref 5–15)
BUN: 16 mg/dL (ref 6–20)
CALCIUM: 9.1 mg/dL (ref 8.9–10.3)
CO2: 37 mmol/L — AB (ref 22–32)
CREATININE: 0.86 mg/dL (ref 0.44–1.00)
Chloride: 91 mmol/L — ABNORMAL LOW (ref 101–111)
GLUCOSE: 84 mg/dL (ref 65–99)
Potassium: 4.7 mmol/L (ref 3.5–5.1)
Sodium: 134 mmol/L — ABNORMAL LOW (ref 135–145)

## 2016-01-15 LAB — CBC
HCT: 32.9 % — ABNORMAL LOW (ref 36.0–46.0)
Hemoglobin: 10.4 g/dL — ABNORMAL LOW (ref 12.0–15.0)
MCH: 32.2 pg (ref 26.0–34.0)
MCHC: 31.6 g/dL (ref 30.0–36.0)
MCV: 101.9 fL — AB (ref 78.0–100.0)
PLATELETS: 199 10*3/uL (ref 150–400)
RBC: 3.23 MIL/uL — AB (ref 3.87–5.11)
RDW: 17.3 % — AB (ref 11.5–15.5)
WBC: 6.5 10*3/uL (ref 4.0–10.5)

## 2016-01-15 LAB — PROTIME-INR
INR: 2.71 — ABNORMAL HIGH (ref 0.00–1.49)
PROTHROMBIN TIME: 28.4 s — AB (ref 11.6–15.2)

## 2016-01-15 LAB — GLUCOSE, CAPILLARY
GLUCOSE-CAPILLARY: 156 mg/dL — AB (ref 65–99)
Glucose-Capillary: 89 mg/dL (ref 65–99)
Glucose-Capillary: 98 mg/dL (ref 65–99)
Glucose-Capillary: 125 mg/dL — ABNORMAL HIGH (ref 65–99)

## 2016-01-15 LAB — PROCALCITONIN: PROCALCITONIN: 0.11 ng/mL

## 2016-01-15 LAB — BRAIN NATRIURETIC PEPTIDE: B NATRIURETIC PEPTIDE 5: 337.7 pg/mL — AB (ref 0.0–100.0)

## 2016-01-15 MED ORDER — FUROSEMIDE 40 MG PO TABS
40.0000 mg | ORAL_TABLET | Freq: Every day | ORAL | Status: DC
  Administered 2016-01-16: 40 mg via ORAL
  Filled 2016-01-15: qty 1

## 2016-01-15 MED ORDER — WARFARIN SODIUM 2.5 MG PO TABS
1.2500 mg | ORAL_TABLET | Freq: Once | ORAL | Status: AC
  Administered 2016-01-15: 1.25 mg via ORAL
  Filled 2016-01-15: qty 0.5

## 2016-01-15 NOTE — Progress Notes (Signed)
PROGRESS NOTE    Shannon [name]  [id number] DOB: September 21, 1931 DOA: 12/30/2015 PCP: Haywood Pao, MD   Brief Narrative:  Shannon [name] is an 82 y.o. female with CAD status post stenting, CHF, paroxysmal atrial fibrillation, diabetes mellitus type 2, hyperlipidemia was brought to the ER after patient was complaining of abdominal pain and shortness of breath.. On exam patient also has bilateral lower extremity edema and wound of the left great toe. Chest x-ray shows features concerning for pneumonia and BNP is elevated. Patient at this time will be admitted for acute respiratory failure probably from CHF and pneumonia. Patient underwent cardiac cath, showing stable CAD. Patient was transferred to stepdown due to worsening shortness of breath. 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 Plan for DCCV.   Assessment & Plan   Acute respiratory failure with hypoxia/Acute systolic CHF -Improving slowly, currently needing 4L O2 -Likely multi-factorial including heart failure and possible pneumonia vs amiodarone toxicity.  -Chest x-ray:Repeat CXR shows slight increase in the interstitial edema. It was followed with a CT chest showing cylindrical bronchiectasis and patchy densities throughout the majority of the both lungs most Pronounced in the upper lobes. There is moderate to large sized right pleural effusion and moderate sized left pleural effusion.  -Echocardiogram 04/07/2015 showed an EF of 50-55%, and repeat Echocardiogram 01/01/2016: EF 20-25%, diffuse hypokinesis, Severe TR with severely elevated pulmonary pressure.  -Cardiology/ pulmonology/ EP consulted and on board.  -Amiodarone was discontinued on 7/10.  -Although ddimer elevated, unlikely PE, as patient's INR has been supratherapeutic on admission. -LE doppler negative for DVT or SVT -monitor renal parameters while on IV lasix and replete potassium as needed.  -Metoprolol held for now given low BP.  Continue spironolactone, losartan, lasix -UOP over past 24hrs 2250cc (net -2130) -CVP 6 -Initially placed on IV lasix, transitioned to PO lasix   Abdominal pain -Resolved.  -CT abdomen and pelvis: small to moderate volume ascites, cholelithiasis, negative for bowel obstruction, perforation or ambulatory change. Bilateral pleural effusions, right greater than left. -Continue to monitor, pain control as needed.   Coronary artery disease -Denies chest pain -Troponin mildly elevated but trending downward (0.1, 0.07, 0.06, 0.05) -Continue aspirin, statin, metoprolol.  -underwent cardiac cath on 7/7 showed widely patent stents. Minimal CAD in LAD. Mild to mod pulm hypertension.   Atrial fibrillation, chronic -CHADSVASC 4 (age, gender, DM) -Continue digoxin (level 0.6), Ranexa -metoprolol held for now given low BP -Continue warfarin, heparin discontinued -INR 2.71 -EP consulted -S/p DCCV, currently in sinus rhythm  Hypotension/bradycardia, mild -Losartan decreased -metoprolol held  Left great toe diabetic wound -Wound care consulted and resume local wound care.  -No surrounding cellulitis.   Diabetes mellitus, type II -Metformin held, continued insulin sliding scale and CBG monitoring  Hyperlipidemia -Continue statin  Anemia of chronic disease.  -Continue iron supplementation -Baseline hemoglobin around 10, currently 10.4  Hypokalemia/hypomagnesemia   -Secondary to diuretics -Continue to monitor and replace as needed  Hyperkalemia -Potassium 5.2, suspect will improve with diuretics -Will hold potassium supplementation  -Monitor BMP  Goals of care -Palliative care consulted and appreciated  DVT Prophylaxis  Coumadin  Code Status: Full  Family Communication: None at bedside  Disposition Plan: Admitted. Continue diuresis per cardiology. Possible discharge on 7/717 to SNF.  Consultants Cardiology, EP, Heart failure Pulmonology Palliative care  Procedures    Echocardiogram LE doppler Cardiac Catheterization DCCV  Antibiotics   Anti-infectives    Start     Dose/Rate Route Frequency Ordered Stop  01/09/16 0900  vancomycin (VANCOCIN) 500 mg in sodium chloride 0.9 % 100 mL IVPB  Status:  Discontinued     500 mg 100 mL/hr over 60 Minutes Intravenous Every 12 hours 01/08/16 2103 01/10/16 1355   01/08/16 2115  ceFEPIme (MAXIPIME) 2 g in dextrose 5 % 50 mL IVPB  Status:  Discontinued     2 g 100 mL/hr over 30 Minutes Intravenous Every 24 hours 01/08/16 2103 01/10/16 1355   01/08/16 2115  vancomycin (VANCOCIN) 1,500 mg in sodium chloride 0.9 % 500 mL IVPB     1,500 mg 250 mL/hr over 120 Minutes Intravenous  Once 01/08/16 2103 01/08/16 2347   01/02/16 1300  cefTRIAXone (ROCEPHIN) 2 g in dextrose 5 % 50 mL IVPB  Status:  Discontinued     2 g 100 mL/hr over 30 Minutes Intravenous Every 24 hours 01/02/16 1156 01/06/16 1808   01/01/16 0500  vancomycin (VANCOCIN) IVPB 1000 mg/200 mL premix  Status:  Discontinued     1,000 mg 200 mL/hr over 60 Minutes Intravenous Every 24 hours 12/31/15 0440 01/02/16 1156   12/31/15 1400  aztreonam (AZACTAM) 1 g in dextrose 5 % 50 mL IVPB  Status:  Discontinued     1 g 100 mL/hr over 30 Minutes Intravenous Every 8 hours 12/31/15 0440 01/02/16 1156   12/31/15 0600  aztreonam (AZACTAM) 2 g in dextrose 5 % 50 mL IVPB  Status:  Discontinued     2 g 100 mL/hr over 30 Minutes Intravenous Every 8 hours 12/31/15 0428 12/31/15 0435   12/31/15 0130  aztreonam (AZACTAM) 1 g in dextrose 5 % 50 mL IVPB     1 g 100 mL/hr over 30 Minutes Intravenous  Once 12/31/15 0121 12/31/15 0338   12/31/15 0130  vancomycin (VANCOCIN) IVPB 1000 mg/200 mL premix     1,000 mg 200 mL/hr over 60 Minutes Intravenous NOW 12/31/15 0122 12/31/15 0247      Subjective:   Shannon [name] seen and examined today. Complains of headache, which she describes as mild.  Feels her breathing is better.  Denies chest pain, dizziness, , abdominal pain,  nausea, vomiting.   Objective:   Filed Vitals:   01/15/16 0600 01/15/16 0757 01/15/16 0800 01/15/16 0900  BP: 82/49 84/49 94/53  86/53  Pulse: 56 61 58 60  Temp:  97.7 F (36.5 C)    TempSrc:  Oral    Resp: 12 17 15 13   Height:      Weight:      SpO2: 100% 97% 100% 100%    Intake/Output Summary (Last 24 hours) at 01/15/16 1022 Last data filed at 01/15/16 0243  Gross per 24 hour  Intake    120 ml  Output   2050 ml  Net  -1930 ml   Filed Weights   01/13/16 0349 01/14/16 0342 01/15/16 0351  Weight: 59.8 kg (131 lb 13.4 oz) 59.7 kg (131 lb 9.8 oz) 57.5 kg (126 lb 12.2 oz)    Exam  General: Well developed, well nourished, No apparent distress  HEENT: NCAT, mucous membranes moist.   Cardiovascular: S1 S2 auscultated, regular, 3/6SEM  Respiratory: Diminished but clear, mild basilar crackles  Abdomen: Soft, nontender, nondistended, + bowel sounds  Extremities: warm dry without cyanosis clubbing. LE edema-improved. Coban wrap  Neuro: AAOx3, nonfocal  Psych: Normal affect and demeanor, pleasant  Data Reviewed: I have personally reviewed following labs and imaging studies  CBC:  Recent Labs Lab 01/11/16 0508 01/12/16 0429 01/13/16 0420 01/14/16 0250 01/15/16 [id number]  WBC 8.5 7.5 6.5 6.8 6.5  HGB 11.5* 11.1* 10.7* 10.7* 10.4*  HCT 35.9* 35.7* 34.6* 33.7* 32.9*  MCV 103.2* 104.4* 103.3* 102.7* 101.9*  PLT 203 199 181 183 [id number]   Basic Metabolic Panel:  Recent Labs Lab 01/12/16 0429 01/12/16 0900 01/13/16 0420 01/14/16 0250 01/15/16 0614  NA 135 136 134* 135 134*  K 4.8 4.2 4.6 5.2* 4.7  CL 93* 92* 88* 90* 91*  CO2 33* 35* 36* 39* 37*  GLUCOSE 139* 135* 114* 109* 84  BUN 18 17 16 18 16   CREATININE 0.82 0.91 0.88 0.96 0.86  CALCIUM 8.9 9.0 9.2 9.2 9.1   GFR: Estimated Creatinine Clearance: 39.9 mL/min (by C-G formula based on Cr of 0.86). Liver Function Tests: No results for input(s): AST, ALT, ALKPHOS, BILITOT, PROT, ALBUMIN in the last 168 hours. No  results for input(s): LIPASE, AMYLASE in the last 168 hours. No results for input(s): AMMONIA in the last 168 hours. Coagulation Profile:  Recent Labs Lab 01/11/16 0508 01/12/16 0429 01/13/16 0420 01/14/16 0250 01/15/16 0614  INR 1.96* 2.02* 2.28* 2.74* 2.71*   Cardiac Enzymes: No results for input(s): CKTOTAL, CKMB, CKMBINDEX, TROPONINI in the last 168 hours. BNP (last 3 results) No results for input(s): PROBNP in the last 8760 hours. HbA1C: No results for input(s): HGBA1C in the last 72 hours. CBG:  Recent Labs Lab 01/14/16 0818 01/14/16 1146 01/14/16 1641 01/14/16 2134 01/15/16 0753  GLUCAP 97 128* 115* 135* 89   Lipid Profile: No results for input(s): CHOL, HDL, LDLCALC, TRIG, CHOLHDL, LDLDIRECT in the last 72 hours. Thyroid Function Tests: No results for input(s): TSH, T4TOTAL, FREET4, T3FREE, THYROIDAB in the last 72 hours. Anemia Panel: No results for input(s): VITAMINB12, FOLATE, FERRITIN, TIBC, IRON, RETICCTPCT in the last 72 hours. Urine analysis:    Component Value Date/Time   COLORURINE YELLOW 12/30/2015 2320   APPEARANCEUR CLOUDY* 12/30/2015 2320   LABSPEC 1.020 12/30/2015 2320   PHURINE 5.0 12/30/2015 2320   GLUCOSEU NEGATIVE 12/30/2015 2320   HGBUR NEGATIVE 12/30/2015 2320   BILIRUBINUR NEGATIVE 12/30/2015 2320   KETONESUR NEGATIVE 12/30/2015 2320   PROTEINUR 30* 12/30/2015 2320   UROBILINOGEN 0.2 10/09/2012 1551   NITRITE NEGATIVE 12/30/2015 2320   LEUKOCYTESUR SMALL* 12/30/2015 2320   Sepsis Labs: @LABRCNTIP (procalcitonin:4,lacticidven:4)  ) Recent Results (from the past 240 hour(s))  MRSA PCR Screening     Status: Abnormal   Collection Time: 01/09/16  3:03 AM  Result Value Ref Range Status   MRSA by PCR POSITIVE (A) NEGATIVE Final    Comment:        The GeneXpert MRSA Assay (FDA approved for NASAL specimens only), is one component of a comprehensive MRSA colonization surveillance program. It is not intended to diagnose  MRSA infection nor to guide or monitor treatment for MRSA infections. RESULT CALLED TO, READ BACK BY AND VERIFIED WITH: Sherry Ruffing RN AT AY:[id number] 01/09/16 BY D. Victoriano Lain       Radiology Studies: No results found.   Scheduled Meds: . atorvastatin  20 mg Oral Daily  . Chlorhexidine Gluconate Cloth  6 each Topical Q0600  . digoxin  0.125 mg Oral Daily  . ferrous sulfate  325 mg Oral Q breakfast  . [START ON 01/16/2016] furosemide  40 mg Oral Daily  . insulin aspart  0-9 Units Subcutaneous TID WC  . LORazepam  0.5 mg Intravenous Once  . losartan  12.5 mg Oral QPM  . mupirocin ointment   Nasal BID  . ranolazine  500 mg  Oral BID  . saccharomyces boulardii  250 mg Oral BID  . sodium chloride flush  10-40 mL Intracatheter Q12H  . sodium chloride flush  3 mL Intravenous Q12H  . sodium chloride flush  3 mL Intravenous Q12H  . sodium chloride flush  3 mL Intravenous Q12H  . spironolactone  12.5 mg Oral Daily  . warfarin  1.25 mg Oral ONCE-1800  . Warfarin - Pharmacist Dosing Inpatient   Does not apply q1800   Continuous Infusions: . sodium chloride 10 mL (01/10/16 1426)     LOS: 15 days   Time Spent in minutes   45 minutes  Eean Buss D.O. on 01/15/2016 at 10:22 AM  Between 7am to 7pm - Pager - [phone]  After 7pm go to [email or web address] - password [email or web address]  And look for the night coverage person covering for me after hours  [facility] Office  [phone]

## 2016-01-15 NOTE — Progress Notes (Signed)
ANTICOAGULATION CONSULT NOTE - Follow Up Consult  Pharmacy Consult for warfarin Indication: atrial fibrillation  Labs:  Recent Labs  01/13/16 0420 01/14/16 0250 01/15/16 0614  HGB 10.7* 10.7* 10.4*  HCT 34.6* 33.7* 32.9*  PLT 181 183 199  LABPROT 24.9* 28.6* 28.4*  INR 2.28* 2.74* 2.71*  CREATININE 0.88 0.96 0.86    Estimated Creatinine Clearance: 39.9 mL/min (by C-G formula based on Cr of 0.86).   Assessment: 82yo F continuing on warfarin for afib. Warfarin PTA restarted post cath on 7/7.  INR remains at goal 2.71 this am. Has received multiple increased doses. No bleeding issues noted. CBC remains low but stable.   PTA warfarin dose: 1.25 mg daily  Goal of Therapy:  INR 2-3 Monitor platelets by anticoagulation protocol: Yes   Plan:  Warfarin 1.25 mg tonight Daily INR Monitor CBC and for s/s bleeding   Aniella Wandrey K. Velva Harman, PharmD, BCPS, CPP Clinical Pharmacist Pager: [phone] Phone: [phone] 01/15/2016 9:17 AM

## 2016-01-15 NOTE — Progress Notes (Signed)
CSW provided offers to patient son- son will review and call CSW with choice  Domenica Reamer, [place] Social Worker [phone]

## 2016-01-15 NOTE — Progress Notes (Signed)
Pt refusing CPAP tonight. RT will continue to monitor.

## 2016-01-15 NOTE — Progress Notes (Signed)
Patient ID: Shannon [name], female   DOB: 1932-03-03, 82 y.o.   MRN: [id number]    Advanced Heart Failure Rounding Note   Subjective:    Admitted with chest pain and dyspnea. Diuresed with IV lasix. EF down from 50-55% in 2016 to 20-25% this admit. Cath as noted below ok.   Had been in A fib with uncontrolled rate so amio increased to 200 mg twice a day( has been on amio since 2016)  and toprol-xl was increased to 50 mg twice a day. Amio stopped on 7/10 due to concern for amio toxicity and ongoing dyspnea and IV lasix increased with improved urine output noted.   On 7/12, she underwent DCCV to NSR.  Remains in NSR.   CVP 6 today, now on po diuretics.  Weight down again today.  BP remains soft but she was not orthostatic when working with cardiac rehab yesterday.   RHC/LHC 01/06/16 Minimal obstructive CAD Patent Stent OM1 RA 13 PA Mean 31 PCWP 16 CO/CI 3.96/2/36 PVR 3.8   ECHO 01/01/16 EF 20-25% Mild MR, dilated left atrium, TR severe, Peak PA pressure 63 mm hg, small pericardial effusion.   ECHO 04/2015 EF 50-55% moderate TR RA moderately Objective:   Weight Range:  Vital Signs:   Temp:  [97.4 F (36.3 C)-98 F (36.7 C)] 97.7 F (36.5 C) (07/16 0757) Pulse Rate:  [28-84] 60 (07/16 0900) Resp:  [9-23] 13 (07/16 0900) BP: (80-116)/(48-68) 86/53 mmHg (07/16 0900) SpO2:  [97 %-100 %] 100 % (07/16 0900) Weight:  [126 lb 12.2 oz (57.5 kg)] 126 lb 12.2 oz (57.5 kg) (07/16 0351) Last BM Date: 01/13/16  Weight change: Filed Weights   01/13/16 0349 01/14/16 0342 01/15/16 0351  Weight: 131 lb 13.4 oz (59.8 kg) 131 lb 9.8 oz (59.7 kg) 126 lb 12.2 oz (57.5 kg)    Intake/Output:   Intake/Output Summary (Last 24 hours) at 01/15/16 0916 Last data filed at 01/15/16 0243  Gross per 24 hour  Intake    120 ml  Output   2050 ml  Net  -1930 ml     Physical Exam: CVP 6 General:  Elderly. Chronically ill appearing. No resp difficulty. In bed.  HEENT: normal Neck: supple. JVP 7.  Carotids 2+ bilat; no bruits. No lymphadenopathy or thryomegaly appreciated. Cor: PMI nondisplaced. Regular rate & rhythm. No rubs, gallops. 3/6 HSM LLSB.  Lungs: Crackles in the bases. On 6 liters oxygen  Abdomen: soft, nontender, nondistended. No hepatosplenomegaly. No bruits or masses. Good bowel sounds. Extremities: no cyanosis, clubbing, rash.  R and LLE coban wrap.  RUE PICC.  Neuro: alert & orientedx3, cranial nerves grossly intact. moves all 4 extremities w/o difficulty. Affect pleasant  Telemetry:  NSR 50s - 60s  Labs: Basic Metabolic Panel:  Recent Labs Lab 01/12/16 0429 01/12/16 0900 01/13/16 0420 01/14/16 0250 01/15/16 0614  NA 135 136 134* 135 134*  K 4.8 4.2 4.6 5.2* 4.7  CL 93* 92* 88* 90* 91*  CO2 33* 35* 36* 39* 37*  GLUCOSE 139* 135* 114* 109* 84  BUN 18 17 16 18 16   CREATININE 0.82 0.91 0.88 0.96 0.86  CALCIUM 8.9 9.0 9.2 9.2 9.1    Liver Function Tests: No results for input(s): AST, ALT, ALKPHOS, BILITOT, PROT, ALBUMIN in the last 168 hours. No results for input(s): LIPASE, AMYLASE in the last 168 hours. No results for input(s): AMMONIA in the last 168 hours.  CBC:  Recent Labs Lab 01/11/16 0508 01/12/16 0429 01/13/16 0420 01/14/16 0250  01/15/16 0614  WBC 8.5 7.5 6.5 6.8 6.5  HGB 11.5* 11.1* 10.7* 10.7* 10.4*  HCT 35.9* 35.7* 34.6* 33.7* 32.9*  MCV 103.2* 104.4* 103.3* 102.7* 101.9*  PLT 203 199 181 183 199    Cardiac Enzymes: No results for input(s): CKTOTAL, CKMB, CKMBINDEX, TROPONINI in the last 168 hours.  BNP: BNP (last 3 results)  Recent Labs  12/30/15 2327 01/07/16 1038 01/15/16 0614  BNP 593.0* 734.1* 337.7*    ProBNP (last 3 results) No results for input(s): PROBNP in the last 8760 hours.    Other results:  Imaging: No results found.   Medications:     Scheduled Medications: . atorvastatin  20 mg Oral Daily  . Chlorhexidine Gluconate Cloth  6 each Topical Q0600  . digoxin  0.125 mg Oral Daily  . ferrous  sulfate  325 mg Oral Q breakfast  . [START ON 01/16/2016] furosemide  40 mg Oral Daily  . insulin aspart  0-9 Units Subcutaneous TID WC  . LORazepam  0.5 mg Intravenous Once  . losartan  12.5 mg Oral QPM  . mupirocin ointment   Nasal BID  . ranolazine  500 mg Oral BID  . saccharomyces boulardii  250 mg Oral BID  . sodium chloride flush  10-40 mL Intracatheter Q12H  . sodium chloride flush  3 mL Intravenous Q12H  . sodium chloride flush  3 mL Intravenous Q12H  . sodium chloride flush  3 mL Intravenous Q12H  . spironolactone  12.5 mg Oral Daily  . Warfarin - Pharmacist Dosing Inpatient   Does not apply q1800    Infusions: . sodium chloride 10 mL (01/10/16 1426)    PRN Medications: sodium chloride, acetaminophen, ALPRAZolam, benzonatate, HYDROcodone-acetaminophen, midazolam, nitroGLYCERIN, ondansetron **OR** ondansetron (ZOFRAN) IV, promethazine, sodium chloride flush, sodium chloride flush, sodium chloride flush   Assessment/Plan/Discussion   82 yo with new-onset nonischemic cardiomyopathy and atrial fibrillation with RVR.   1. Atrial fibrillation: Appears chronic since 8/16. HR appears to have been poorly controlled. 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  Now in NSR s/p DCCV on 7/12. - Pulmonary has seen, chest CT showed bronchiectasis/fibrosis and patchy bilateral densities. I discussed this with Dr Lake Bells, he recommends staying off amiodarone given concern for pulmonary toxicity. - I am concerned that she may not hold NSR without some form of rhythm control (and concerned that atrial fibrillation/RVR triggered a tachy-mediated CMP).  No amiodarone as above.  No Tikosyn yet given the long-acting nature of amiodarone and need to wash out.  I started ranolazine 500 mg bid. Will get ECG today on ranolazine to follow QT.  - Rate control has been difficult when in atrial fibrillation.  If afib/RVR recurs, will  need to consider AV nodal ablation/BiV pacing given significant fall in EF with worry for tachy-mediated CMP.  - INR therapeutic on warfarin.    2. Acute systolic CHF: New fall in EF to 20-25%, nonischemic cardiomyopathy with no obstructive disease (patent OM stent). Also with severe TR. I am concerned that there is a component of tachycardia-mediated cardiomyopathy. RHC showed preserved cardiac output but co-ox low at 54% today.  CVP down to 6, now on po Lasix.  BP remains soft. - Think we can drop Lasix back to 40 mg daily rather than bid.  - Continue losartan 12.5 mg daily.  - HR in 50s with low BP, off Toprol XL now.  - Continue digoxin, check level again  in am.   - Continue spironolactone at low dose.  - Though co-ox marginal,  would hold off on milrinone at this point with history of difficult-to-control atrial fibrillation.   3. Severe TR 4. CAD: Nonobstructive on cath this admission.  5. ID: Antibiotics stopped as PNA unlikely.  6. Pleural effusions: Suspect transudative, she refused thoracentesis.  7. Deconditioning: Continue to work with PT, will need SNF.  From cardiac standpoint, she will likely be ready to go Monday.   [name] 01/15/2016 9:16 AM

## 2016-01-16 ENCOUNTER — Other Ambulatory Visit: Payer: Commercial Managed Care - HMO

## 2016-01-16 DIAGNOSIS — Z87891 Personal history of nicotine dependence: Secondary | ICD-10-CM | POA: Diagnosis not present

## 2016-01-16 DIAGNOSIS — J189 Pneumonia, unspecified organism: Secondary | ICD-10-CM | POA: Diagnosis not present

## 2016-01-16 DIAGNOSIS — E46 Unspecified protein-calorie malnutrition: Secondary | ICD-10-CM | POA: Diagnosis not present

## 2016-01-16 DIAGNOSIS — K5901 Slow transit constipation: Secondary | ICD-10-CM | POA: Diagnosis not present

## 2016-01-16 DIAGNOSIS — L97529 Non-pressure chronic ulcer of other part of left foot with unspecified severity: Secondary | ICD-10-CM | POA: Diagnosis not present

## 2016-01-16 DIAGNOSIS — T462X5A Adverse effect of other antidysrhythmic drugs, initial encounter: Secondary | ICD-10-CM | POA: Diagnosis not present

## 2016-01-16 DIAGNOSIS — Z7901 Long term (current) use of anticoagulants: Secondary | ICD-10-CM | POA: Diagnosis not present

## 2016-01-16 DIAGNOSIS — Z7984 Long term (current) use of oral hypoglycemic drugs: Secondary | ICD-10-CM | POA: Diagnosis not present

## 2016-01-16 DIAGNOSIS — I429 Cardiomyopathy, unspecified: Secondary | ICD-10-CM | POA: Diagnosis not present

## 2016-01-16 DIAGNOSIS — R0602 Shortness of breath: Secondary | ICD-10-CM | POA: Diagnosis not present

## 2016-01-16 DIAGNOSIS — M6281 Muscle weakness (generalized): Secondary | ICD-10-CM | POA: Diagnosis not present

## 2016-01-16 DIAGNOSIS — I4891 Unspecified atrial fibrillation: Secondary | ICD-10-CM | POA: Diagnosis not present

## 2016-01-16 DIAGNOSIS — Z5189 Encounter for other specified aftercare: Secondary | ICD-10-CM | POA: Diagnosis not present

## 2016-01-16 DIAGNOSIS — I4892 Unspecified atrial flutter: Secondary | ICD-10-CM | POA: Diagnosis not present

## 2016-01-16 DIAGNOSIS — I480 Paroxysmal atrial fibrillation: Secondary | ICD-10-CM | POA: Diagnosis not present

## 2016-01-16 DIAGNOSIS — R1312 Dysphagia, oropharyngeal phase: Secondary | ICD-10-CM | POA: Diagnosis not present

## 2016-01-16 DIAGNOSIS — Z79899 Other long term (current) drug therapy: Secondary | ICD-10-CM | POA: Diagnosis not present

## 2016-01-16 DIAGNOSIS — J984 Other disorders of lung: Secondary | ICD-10-CM | POA: Diagnosis not present

## 2016-01-16 DIAGNOSIS — I509 Heart failure, unspecified: Secondary | ICD-10-CM | POA: Diagnosis not present

## 2016-01-16 DIAGNOSIS — I5022 Chronic systolic (congestive) heart failure: Secondary | ICD-10-CM | POA: Diagnosis not present

## 2016-01-16 DIAGNOSIS — L89159 Pressure ulcer of sacral region, unspecified stage: Secondary | ICD-10-CM | POA: Diagnosis not present

## 2016-01-16 DIAGNOSIS — Z955 Presence of coronary angioplasty implant and graft: Secondary | ICD-10-CM | POA: Diagnosis not present

## 2016-01-16 DIAGNOSIS — J9601 Acute respiratory failure with hypoxia: Secondary | ICD-10-CM | POA: Diagnosis not present

## 2016-01-16 DIAGNOSIS — I2510 Atherosclerotic heart disease of native coronary artery without angina pectoris: Secondary | ICD-10-CM | POA: Diagnosis not present

## 2016-01-16 DIAGNOSIS — Z8673 Personal history of transient ischemic attack (TIA), and cerebral infarction without residual deficits: Secondary | ICD-10-CM | POA: Diagnosis not present

## 2016-01-16 DIAGNOSIS — Z823 Family history of stroke: Secondary | ICD-10-CM | POA: Diagnosis not present

## 2016-01-16 DIAGNOSIS — R109 Unspecified abdominal pain: Secondary | ICD-10-CM | POA: Diagnosis not present

## 2016-01-16 DIAGNOSIS — R5381 Other malaise: Secondary | ICD-10-CM | POA: Diagnosis not present

## 2016-01-16 DIAGNOSIS — R41841 Cognitive communication deficit: Secondary | ICD-10-CM | POA: Diagnosis not present

## 2016-01-16 DIAGNOSIS — I482 Chronic atrial fibrillation: Secondary | ICD-10-CM | POA: Diagnosis not present

## 2016-01-16 DIAGNOSIS — I5021 Acute systolic (congestive) heart failure: Secondary | ICD-10-CM | POA: Diagnosis not present

## 2016-01-16 DIAGNOSIS — I5023 Acute on chronic systolic (congestive) heart failure: Secondary | ICD-10-CM | POA: Diagnosis not present

## 2016-01-16 DIAGNOSIS — E785 Hyperlipidemia, unspecified: Secondary | ICD-10-CM | POA: Diagnosis not present

## 2016-01-16 DIAGNOSIS — Z8249 Family history of ischemic heart disease and other diseases of the circulatory system: Secondary | ICD-10-CM | POA: Diagnosis not present

## 2016-01-16 DIAGNOSIS — E119 Type 2 diabetes mellitus without complications: Secondary | ICD-10-CM | POA: Diagnosis not present

## 2016-01-16 DIAGNOSIS — F419 Anxiety disorder, unspecified: Secondary | ICD-10-CM | POA: Diagnosis not present

## 2016-01-16 DIAGNOSIS — J90 Pleural effusion, not elsewhere classified: Secondary | ICD-10-CM | POA: Diagnosis not present

## 2016-01-16 DIAGNOSIS — I959 Hypotension, unspecified: Secondary | ICD-10-CM | POA: Diagnosis not present

## 2016-01-16 DIAGNOSIS — I428 Other cardiomyopathies: Secondary | ICD-10-CM | POA: Diagnosis not present

## 2016-01-16 LAB — GLUCOSE, CAPILLARY
Glucose-Capillary: 82 mg/dL (ref 65–99)
Glucose-Capillary: 133 mg/dL — ABNORMAL HIGH (ref 65–99)
Glucose-Capillary: 159 mg/dL — ABNORMAL HIGH (ref 65–99)

## 2016-01-16 LAB — BASIC METABOLIC PANEL
Anion gap: 6 (ref 5–15)
BUN: 16 mg/dL (ref 6–20)
CHLORIDE: 90 mmol/L — AB (ref 101–111)
CO2: 35 mmol/L — AB (ref 22–32)
Calcium: 8.9 mg/dL (ref 8.9–10.3)
Creatinine, Ser: 0.87 mg/dL (ref 0.44–1.00)
GFR calc non Af Amer: >60 mL/min (ref >60)
Glucose, Bld: 89 mg/dL (ref 65–99)
POTASSIUM: 4.6 mmol/L (ref 3.5–5.1)
SODIUM: 131 mmol/L — AB (ref 135–145)

## 2016-01-16 LAB — CBC
HEMATOCRIT: 34.1 % — AB (ref 36.0–46.0)
Hemoglobin: 10.6 g/dL — ABNORMAL LOW (ref 12.0–15.0)
MCH: 31.9 pg (ref 26.0–34.0)
MCHC: 31.1 g/dL (ref 30.0–36.0)
MCV: 102.7 fL — AB (ref 78.0–100.0)
Platelets: 198 10*3/uL (ref 150–400)
RBC: 3.32 MIL/uL — AB (ref 3.87–5.11)
RDW: 17.1 % — ABNORMAL HIGH (ref 11.5–15.5)
WBC: 7 10*3/uL (ref 4.0–10.5)

## 2016-01-16 LAB — PROTIME-INR
INR: 2.49 — ABNORMAL HIGH (ref 0.00–1.49)
Prothrombin Time: 26.6 seconds — ABNORMAL HIGH (ref 11.6–15.2)

## 2016-01-16 LAB — DIGOXIN LEVEL: Digoxin Level: 1.6 ng/mL (ref 0.8–2.0)

## 2016-01-16 MED ORDER — FUROSEMIDE 40 MG PO TABS: 40.0000 mg | ORAL_TABLET | Freq: Every day | ORAL | Status: DC

## 2016-01-16 MED ORDER — SPIRONOLACTONE 25 MG PO TABS: 12.5000 mg | ORAL_TABLET | Freq: Every day | ORAL | Status: DC

## 2016-01-16 MED ORDER — HYDROCODONE-ACETAMINOPHEN 5-325 MG PO TABS: 0.5000 | ORAL_TABLET | ORAL | Status: DC | PRN

## 2016-01-16 MED ORDER — LOSARTAN POTASSIUM 25 MG PO TABS: 12.5000 mg | ORAL_TABLET | Freq: Every evening | ORAL | Status: DC

## 2016-01-16 MED ORDER — DIGOXIN 125 MCG PO TABS: 0.0625 mg | ORAL_TABLET | Freq: Every day | ORAL | Status: DC

## 2016-01-16 MED ORDER — BENZONATATE 200 MG PO CAPS: 200.0000 mg | ORAL_CAPSULE | Freq: Three times a day (TID) | ORAL | Status: DC | PRN

## 2016-01-16 MED ORDER — DIGOXIN 62.5 MCG PO TABS: 0.0625 mg | ORAL_TABLET | ORAL | Status: DC

## 2016-01-16 MED ORDER — DIGOXIN 125 MCG PO TABS: 0.0625 mg | ORAL_TABLET | ORAL | Status: DC

## 2016-01-16 MED ORDER — MUPIROCIN 2 % EX OINT: TOPICAL_OINTMENT | Freq: Two times a day (BID) | CUTANEOUS | Status: DC

## 2016-01-16 MED ORDER — ACETAMINOPHEN 325 MG PO TABS: 650.0000 mg | ORAL_TABLET | ORAL | Status: DC | PRN

## 2016-01-16 MED ORDER — RANOLAZINE ER 500 MG PO TB12: 500.0000 mg | ORAL_TABLET | Freq: Two times a day (BID) | ORAL | Status: DC

## 2016-01-16 MED ORDER — WARFARIN SODIUM 2.5 MG PO TABS
1.2500 mg | ORAL_TABLET | Freq: Every day | ORAL | Status: DC
  Administered 2016-01-16: 1.25 mg via ORAL
  Filled 2016-01-16: qty 0.5

## 2016-01-16 MED ORDER — ALPRAZOLAM 0.25 MG PO TABS: 0.2500 mg | ORAL_TABLET | Freq: Every day | ORAL | Status: DC | PRN

## 2016-01-16 NOTE — Progress Notes (Signed)
ANTICOAGULATION CONSULT NOTE - Follow Up Consult  Pharmacy Consult for Coumadin Indication: atrial fibrillation  Allergies  Allergen Reactions  . Demerol Other (See Comments)    Burning sensation all over body  . Erythromycin Nausea And Vomiting  . Penicillins Other (See Comments)    unknown  . Percocet [Oxycodone-Acetaminophen] Swelling    Patient Measurements: Height: 5\' 2"  (157.5 cm) Weight: 126 lb 5.2 oz (57.3 kg) IBW/kg (Calculated) : 50.1  Vital Signs: Temp: 98.3 F (36.8 C) (07/17 1144) Temp Source: Oral (07/17 1144) BP: 90/57 mmHg (07/17 1200) Pulse Rate: 66 (07/17 1200)  Labs:  Recent Labs  01/14/16 0250 01/15/16 0614 01/16/16 0455  HGB 10.7* 10.4* 10.6*  HCT 33.7* 32.9* 34.1*  PLT 183 199 198  LABPROT 28.6* 28.4* 26.6*  INR 2.74* 2.71* 2.49*  CREATININE 0.96 0.86 0.87    Estimated Creatinine Clearance: 39.4 mL/min (by C-G formula based on Cr of 0.87).  Assessment: 84yof continues on coumadin for afib. INR therapeutic at 2.49. CBC stable. No bleeding.  Goal of Therapy:  INR 2-3 Monitor platelets by anticoagulation protocol: Yes   Plan:  1) Coumadin 1.25mg  daily 2) Daily INR  Deboraha Sprang 01/16/2016,2:18 PM

## 2016-01-16 NOTE — Progress Notes (Signed)
CARDIAC REHAB PHASE I   Completed heart failure education with pt. Reviewed CHF booklet and zone tool, daily weights, sodium restriction, heart healthy diet, carb counting, and phase 2 cardiac rehab. Pt verbalized understanding, able to answer teach back questions. Pt agrees to phase 2 cardiac rehab referral. Will send to [facility] per pt request. Pt in recliner, call bell within reach.  [place], RN, BSN 01/16/2016 2:15 PM

## 2016-01-16 NOTE — Clinical Social Work Note (Signed)
CSW presented bed offers to patient's son and he chose [facility].  CSW contacted insurance company awaiting insurance approval.  CSW to continue to follow patient's progress throughout discharge planning.  Jones Broom. [place], MSW, [place] 01/16/2016 12:06 PM

## 2016-01-16 NOTE — Progress Notes (Signed)
Patient ID: Shannon [name], female   DOB: June 06, 1932, 82 y.o.   MRN: [id number]    Advanced Heart Failure Rounding Note   Subjective:    Admitted with chest pain and dyspnea. Diuresed with IV lasix. EF down from 50-55% in 2016 to 20-25% this admit. Cath as noted below ok.   Had been in A fib with uncontrolled rate so amio increased to 200 mg twice a day( has been on amio since 2016)  and toprol-xl was increased to 50 mg twice a day. Amio stopped on 7/10 due to concern for amio toxicity and ongoing dyspnea and IV lasix increased with improved urine output noted.   On 7/12, she underwent DCCV to NSR.  Remains in NSR.   CVP 5. Po lasix decreased with hypotension and continued diuresis. Out 1.3 L and weight stable. BP remains soft but not orthostatic. She states she is chronically lightheaded with standing. No worse than usual.     RHC/LHC 01/06/16 Minimal obstructive CAD Patent Stent OM1 RA 13 PA Mean 31 PCWP 16 CO/CI 3.96/2/36 PVR 3.8   ECHO 01/01/16 EF 20-25% Mild MR, dilated left atrium, TR severe, Peak PA pressure 63 mm hg, small pericardial effusion.   ECHO 04/2015 EF 50-55% moderate TR RA moderately Objective:   Weight Range:  Vital Signs:   Temp:  [97.7 F (36.5 C)-98.2 F (36.8 C)] 98.2 F (36.8 C) (07/17 0357) Pulse Rate:  [53-65] 65 (07/17 0636) Resp:  [11-24] 13 (07/17 0636) BP: (82-98)/(49-61) 98/59 mmHg (07/17 0636) SpO2:  [97 %-100 %] 100 % (07/17 0636) Weight:  [126 lb 5.2 oz (57.3 kg)] 126 lb 5.2 oz (57.3 kg) (07/17 0416) Last BM Date: 01/15/16  Weight change: Filed Weights   01/14/16 0342 01/15/16 0351 01/16/16 0416  Weight: 131 lb 9.8 oz (59.7 kg) 126 lb 12.2 oz (57.5 kg) 126 lb 5.2 oz (57.3 kg)    Intake/Output:   Intake/Output Summary (Last 24 hours) at 01/16/16 0717 Last data filed at 01/16/16 0400  Gross per 24 hour  Intake     13 ml  Output   1400 ml  Net  -1387 ml     Physical Exam: CVP 5 General:  Elderly. Chronically ill appearing. No  resp difficulty. In bed.  HEENT: normal Neck: supple. JVP not elevated. Carotids 2+ bilat; no bruits. No thyromegaly or nodule noted.  Cor: PMI nondisplaced. RRR. No rubs, gallops. 3/6 HSM LLSB.  Lungs: Crackles in the bases. On 6 liters oxygen  Abdomen: soft, NT, ND, no HSM. No bruits or masses. +BS  Extremities: no cyanosis, clubbing, rash.  R and LLE coban wrap.  RUE PICC.  Neuro: alert & orientedx3, cranial nerves grossly intact. moves all 4 extremities w/o difficulty. Affect pleasant  Telemetry:  NSR 50s - 60s  Labs: Basic Metabolic Panel:  Recent Labs Lab 01/12/16 0900 01/13/16 0420 01/14/16 0250 01/15/16 0614 01/16/16 0455  NA 136 134* 135 134* 131*  K 4.2 4.6 5.2* 4.7 4.6  CL 92* 88* 90* 91* 90*  CO2 35* 36* 39* 37* 35*  GLUCOSE 135* 114* 109* 84 89  BUN 17 16 18 16 16   CREATININE 0.91 0.88 0.96 0.86 0.87  CALCIUM 9.0 9.2 9.2 9.1 8.9    Liver Function Tests: No results for input(s): AST, ALT, ALKPHOS, BILITOT, PROT, ALBUMIN in the last 168 hours. No results for input(s): LIPASE, AMYLASE in the last 168 hours. No results for input(s): AMMONIA in the last 168 hours.  CBC:  Recent Labs Lab  01/12/16 0429 01/13/16 0420 01/14/16 0250 01/15/16 0614 01/16/16 0455  WBC 7.5 6.5 6.8 6.5 7.0  HGB 11.1* 10.7* 10.7* 10.4* 10.6*  HCT 35.7* 34.6* 33.7* 32.9* 34.1*  MCV 104.4* 103.3* 102.7* 101.9* 102.7*  PLT 199 181 183 199 198    Cardiac Enzymes: No results for input(s): CKTOTAL, CKMB, CKMBINDEX, TROPONINI in the last 168 hours.  BNP: BNP (last 3 results)  Recent Labs  12/30/15 2327 01/07/16 1038 01/15/16 0614  BNP 593.0* 734.1* 337.7*    ProBNP (last 3 results) No results for input(s): PROBNP in the last 8760 hours.    Other results:  Imaging: No results found.   Medications:     Scheduled Medications: . atorvastatin  20 mg Oral Daily  . digoxin  0.125 mg Oral Daily  . ferrous sulfate  325 mg Oral Q breakfast  . furosemide  40 mg Oral  Daily  . insulin aspart  0-9 Units Subcutaneous TID WC  . LORazepam  0.5 mg Intravenous Once  . losartan  12.5 mg Oral QPM  . mupirocin ointment   Nasal BID  . ranolazine  500 mg Oral BID  . saccharomyces boulardii  250 mg Oral BID  . sodium chloride flush  10-40 mL Intracatheter Q12H  . sodium chloride flush  3 mL Intravenous Q12H  . spironolactone  12.5 mg Oral Daily  . Warfarin - Pharmacist Dosing Inpatient   Does not apply q1800    Infusions: . sodium chloride 10 mL (01/10/16 1426)    PRN Medications: acetaminophen, ALPRAZolam, benzonatate, HYDROcodone-acetaminophen, midazolam, nitroGLYCERIN, ondansetron **OR** ondansetron (ZOFRAN) IV, promethazine, sodium chloride flush, sodium chloride flush   Assessment/Plan/Discussion   82 yo with new-onset nonischemic cardiomyopathy and atrial fibrillation with RVR.   1. Atrial fibrillation: Appears chronic since 8/16. HR appears to have been poorly controlled. 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  Now in NSR s/p DCCV on 7/12. - Pulmonary has seen, chest CT showed bronchiectasis/fibrosis and patchy bilateral densities. Dr. [name] discussed this with Dr Lake Bells, who recommends staying off amiodarone given concern for pulmonary toxicity. - Concerned that she may not hold NSR without some form of rhythm control (and concerned that atrial fibrillation/RVR triggered a tachy-mediated CMP).  - No Tikosyn yet given the long-acting nature of amiodarone and need to wash out.   - Started on ranolazine 500 mg bid. QTc 439 01/15/16. Will follow with ECG at follow up.   - Rate control has been difficult when in atrial fibrillation.  If afib/RVR recurs, will need to consider AV nodal ablation/BiV pacing given significant fall in EF with worry for tachy-mediated CMP.  - INR therapeutic on warfarin.   INR 2.49 this am.  2. Acute systolic CHF: New fall in EF to 20-25%,  nonischemic cardiomyopathy with no obstructive disease (patent OM stent). Also with severe TR. Concerned that there is a component of tachycardia-mediated cardiomyopathy. RHC showed preserved cardiac output but co-ox low at 54% 01/13/16.  CVP down to 6, now on po Lasix.  BP remains soft. - Continue Lasix 40 mg daily   - Continue losartan 12.5 mg daily.  - HR in 50-60s with low BP, off Toprol XL now.  - Digoxin level 1.6 this am. Decrease digoxin to 0.0625 mg, and will hold until 01/18/16.   - Continue spironolactone at low dose.  - Though co-ox marginal,  Holding off on milrinone with difficult-to-control atrial fibrillation.   3. Severe  TR 4. CAD: Nonobstructive on cath this admission.  5. ID: Antibiotics stopped as PNA unlikely.  6. Pleural effusions: Suspect transudative, she refused thoracentesis.  7. Deconditioning: Continue to work with PT, will need SNF.  From cardiac standpoint, she will likely be ready to go Monday.   HF meds for home Lasix 40 mg daily. Losartan 12.5 mg daily Digoxin 0.0625 mg (hold today and tomorrow, restart at [facility] on 01/18/16) Ranolazine 500 mg BID.  Spironolactone 12.5 mg daily Warfarin - Will need [facility] (she is an established patient there)  Will need close follow up in HF clinic. Will schedule for 01/25/16 @ 1030 am.   Shirley Friar PA-C 01/16/2016 7:17 AM  Advanced Heart Failure Team Pager [phone] (M-F; [place])  Please contact [facility] for night-coverage after hours (4p -7a ) and weekends on [email or web address]  Patient seen with PA, agree with the above note.  Stable today, breathing much better.  Will go to SNF on the above meds.   [name] 01/16/2016 5:19 PM

## 2016-01-16 NOTE — Discharge Summary (Signed)
Physician Discharge Summary  Shannon [name] [id number] DOB: 1931/09/29 DOA: 12/30/2015  PCP: Haywood Pao, MD  Admit date: 12/30/2015 Discharge date: 01/16/2016  Time spent: 45 minutes  Recommendations for Outpatient Follow-up:  Patient will be discharged to skilled nursing facility.  Continue physical and occupational therapy.  Patient will need to follow up with primary care provider within one week of discharge, repeat CBC and BMP.  Follow up with heart failure clinic on 01/25/2016 at 10:30am. Follow with coumadin clinic. Patient should continue medications as prescribed.  Patient should follow a heart healthy/carb modified diet.   Discharge Diagnoses:  Acute respiratory failure with hypoxia/Acute systolic CHF Abdominal pain Coronary artery disease Atrial fibrillation, chronic Hypotension/bradycardia, mild Left great toe diabetic wound Diabetes mellitus, type II Hyperlipidemia Anemia of chronic disease.  Hypokalemia/hypomagnesemia  Hyperkalemia Goals of care  Discharge Condition: Stable  Diet recommendation: Heart healthy/carb modified  Filed Weights   01/14/16 0342 01/15/16 0351 01/16/16 0416  Weight: 59.7 kg (131 lb 9.8 oz) 57.5 kg (126 lb 12.2 oz) 57.3 kg (126 lb 5.2 oz)    History of present illness:  On 12/31/2015 by Dr. Gean Birchwood Shannon [name] is a 82 y.o. female with CAD status post stenting, CHF, paroxysmal atrial fibrillation, diabetes mellitus type 2, hyperlipidemia was brought to the ER after patient was complaining of abdominal pain and shortness of breath. Patient's abdominal pain started last evening which was generalized and by the time patient reached ER pain has largely resolved. Patient has recently followed up with her cardiologist and at that time and complained of weakness poor appetite and as per the patient's daughter lipase was elevated during the lab works done at the cardiologist's office. Patient was found be short of breath  and states she has been having some nonproductive cough for last 1 week and also has gained 30 pounds over the last 3 months. Denies any chest pain. Patient takes Lasix 20 mg daily. On exam patient also has bilateral lower extremity edema and wound of the left great toe. Chest x-ray shows features concerning for pneumonia and BNP is elevated. Troponin is mildly elevated. Patient is on Coumadin. Patient at this time will be admitted for acute respiratory failure probably from CHF and pneumonia and abdominal discomfort.   Hospital Course:  Acute respiratory failure with hypoxia/Acute systolic CHF -Improving slowly, currently needing 4L O2 -Likely multi-factorial including heart failure and possible pneumonia vs amiodarone toxicity.  -Chest x-ray:Repeat CXR shows slight increase in the interstitial edema. It was followed with a CT chest showing cylindrical bronchiectasis and patchy densities throughout the majority of the both lungs most Pronounced in the upper lobes. There is moderate to large sized right pleural effusion and moderate sized left pleural effusion.  -Echocardiogram 04/07/2015 showed an EF of 50-55%, and repeat Echocardiogram 01/01/2016: EF 20-25%, diffuse hypokinesis, Severe TR with severely elevated pulmonary pressure.  -Cardiology/ pulmonology/ EP consulted and on board.  -Amiodarone was discontinued on 7/10.  -Although ddimer elevated, unlikely PE, as patient's INR has been supratherapeutic on admission. -LE doppler negative for DVT or SVT -monitor renal parameters while on IV lasix and replete potassium as needed.  -Metoprolol held for now given low BP. Continue spironolactone, losartan, lasix -UOP over past 24hrs 1400cc (net -[id number]) -CVP 6 -Initially placed on IV lasix, transitioned to PO lasix  -Continue digoxin, however start on 01/18/2016 every other day -Follow up with heart failure clinic on 01/25/2016 at 10:30am  Abdominal pain -Resolved.  -CT abdomen and pelvis:  small  to moderate volume ascites, cholelithiasis, negative for bowel obstruction, perforation or ambulatory change. Bilateral pleural effusions, right greater than left. -Continue to monitor, pain control as needed.   Coronary artery disease -Denies chest pain -Troponin mildly elevated but trending downward (0.1, 0.07, 0.06, 0.05) -Continue aspirin, statin, metoprolol.  -underwent cardiac cath on 7/7 showed widely patent stents. Minimal CAD in LAD. Mild to mod pulm hypertension.   Atrial fibrillation, chronic -CHADSVASC 4 (age, gender, DM) -Continue digoxin,Ranexa -metoprolol held for now given low BP -Continue warfarin, heparin discontinued -INR 2.49 -EP consulted -S/p DCCV, currently in sinus rhythm -Follow up with coumadin clinic  Hypotension/bradycardia, mild -Losartan decreased -metoprolol held  Left great toe diabetic wound -Wound care consulted and resume local wound care.  -No surrounding cellulitis.   Diabetes mellitus, type II -Metformin held, continued insulin sliding scale and CBG monitoring  Hyperlipidemia -Continue statin  Anemia of chronic disease.  -Continue iron supplementation -Baseline hemoglobin around 10, currently 10.6  Hypokalemia/hypomagnesemia  -Secondary to diuretics -Continue to monitor and replace as needed  Hyperkalemia -Resolved, Potassium 4.6 today -repeat BMP in one week  Goals of care -Palliative care consulted and appreciated  Consultants Cardiology, EP, Heart failure Pulmonology Palliative care  Procedures  Echocardiogram LE doppler Cardiac Catheterization DCCV  Discharge Exam: Filed Vitals:   01/16/16 0900 01/16/16 1000  BP:  94/58  Pulse: 63 67  Temp:    Resp: 22 14   Exam  General: Well developed, well nourished, No apparent distress  HEENT: NCAT, mucous membranes moist.   Cardiovascular: S1 S2 auscultated, regular, 3/6SEM  Respiratory: Diminished but clear, equal chest rise  Abdomen: Soft,  nontender, nondistended, + bowel sounds  Extremities: warm dry without cyanosis clubbing. LE edema-improved. Coban wrap  Neuro: AAOx3, nonfocal  Psych: Normal affect and demeanor, pleasant  Discharge Instructions      Discharge Instructions    AMB Referral to [facility]    Complete by:  As directed   Reason for consult:  Post hospital follow up  Diagnoses of:   Heart Failure COPD/ Pneumonia Diabetes    Expected date of contact:  1-3 days (reserved for hospital discharges)  Please assign to community nurse for transition of care calls and assess for home visits. PLEASE NOTE:  Patient is planning to discharge to her son's home in [place] [address]. [name] [phone].  Patient was not sure of house number.  Questions please call:   Natividad Brood, RN BSN [facility] Liaison  [phone] business mobile phone Toll free office [phone]     Discharge instructions    Complete by:  As directed   Patient will be discharged to skilled nursing facility.  Continue physical and occupational therapy.  Patient will need to follow up with primary care provider within one week of discharge, repeat CBC and BMP.  Follow up with heart failure clinic on 01/25/2016 at 10:30am. Follow with coumadin clinic. Patient should continue medications as prescribed.  Patient should follow a heart healthy/carb modified diet.            Medication List    STOP taking these medications        amiodarone 200 MG tablet  Commonly known as:  PACERONE     KLOR-CON 10 10 MEQ tablet  Generic drug:  potassium chloride     metoprolol succinate 50 MG 24 hr tablet  Commonly known as:  TOPROL-XL      TAKE these medications  ACCU-CHEK SMARTVIEW test strip  Generic drug:  glucose blood     acetaminophen 325 MG tablet  Commonly known as:  TYLENOL  Take 2 tablets (650 mg total) by mouth every 4 (four) hours as needed for headache or mild pain.      ALPRAZolam 0.25 MG tablet  Commonly known as:  XANAX  Take 1 tablet (0.25 mg total) by mouth daily as needed for anxiety.     atorvastatin 20 MG tablet  Commonly known as:  LIPITOR  TAKE ONE TABLET BY MOUTH ONE TIME DAILY     benzonatate 200 MG capsule  Commonly known as:  TESSALON  Take 1 capsule (200 mg total) by mouth 3 (three) times daily as needed for cough.     Digoxin 62.5 MCG Tabs  Take 0.0625 mg by mouth every other day. Start on 01/18/16.  Start taking on:  01/18/2016     ferrous sulfate 325 (65 FE) MG tablet  Take 325 mg by mouth daily with breakfast.     furosemide 40 MG tablet  Commonly known as:  LASIX  Take 1 tablet (40 mg total) by mouth daily.     HYDROcodone-acetaminophen 5-325 MG tablet  Commonly known as:  NORCO/VICODIN  Take 0.5-1 tablets by mouth every 4 (four) hours as needed for severe pain.     hydrocortisone 1 % Crea  Commonly known as:  PROCTO-PAK  Apply 1 application topically daily.     losartan 25 MG tablet  Commonly known as:  COZAAR  Take 0.5 tablets (12.5 mg total) by mouth every evening.     metFORMIN 500 MG tablet  Commonly known as:  GLUCOPHAGE  Take 500 mg by mouth 2 (two) times daily.     mupirocin ointment 2 %  Commonly known as:  BACTROBAN  Place into the nose 2 (two) times daily.     nitroGLYCERIN 0.4 MG SL tablet  Commonly known as:  NITROSTAT  Place 0.4 mg under the tongue every 5 (five) minutes x 3 doses as needed for chest pain. Up to 3 doses, if pain continues, call 911.     ranolazine 500 MG 12 hr tablet  Commonly known as:  RANEXA  Take 1 tablet (500 mg total) by mouth 2 (two) times daily.     saccharomyces boulardii 250 MG capsule  Commonly known as:  FLORASTOR  Take 1 capsule (250 mg total) by mouth 2 (two) times daily.     SANTYL ointment  Generic drug:  collagenase  Apply 1 application topically daily.     spironolactone 25 MG tablet  Commonly known as:  ALDACTONE  Take 0.5 tablets (12.5 mg total) by mouth  daily.     VITAMIN B-12 PO  Take 1 tablet by mouth daily.     VITAMIN B-6 PO  Take 1 tablet by mouth daily.     VITAMIN D PO  Take 1 tablet by mouth daily.     warfarin 2.5 MG tablet  Commonly known as:  COUMADIN  Take as directed by Coumadin Clinic.       Allergies  Allergen Reactions  . Demerol Other (See Comments)    Burning sensation all over body  . Erythromycin Nausea And Vomiting  . Penicillins Other (See Comments)    unknown  . Percocet [Oxycodone-Acetaminophen] Swelling   Follow-up Information    Follow up with [facility] On 01/26/2016.   Specialty:  Pulmonology   Why:  at 1000 for post hospital follow up.  Contact information:   [place] [phone]      Follow up with [name], MD On 01/25/2016.   Specialty:  Cardiology   Why:  at 1030 am for post hospital follow up. Please bring all of your medications to your visit. The code for parking is 3000.   Contact information:   [address]. [place] [address] [phone]       Follow up with Haywood Pao, MD. Schedule an appointment as soon as possible for a visit in 1 week.   Specialty:  Internal Medicine   Why:  Hospital follow up   Contact information:   [address] [place] [address] [phone]        The results of significant diagnostics from this hospitalization (including imaging, microbiology, ancillary and laboratory) are listed below for reference.    Significant Diagnostic Studies: Ct Abdomen Pelvis Wo Contrast  12/31/2015  CLINICAL DATA:  Abdominal pain and nausea for 3 days EXAM: CT ABDOMEN AND PELVIS WITHOUT CONTRAST TECHNIQUE: Multidetector CT imaging of the abdomen and pelvis was performed following the standard protocol without IV contrast. COMPARISON:  None. FINDINGS: There is small to moderate volume ascites. There are unremarkable unenhanced appearances of the liver, spleen, pancreas and  adrenals. There are several calculi in the gallbladder lumen measuring up to 4 mm. Kidneys are unremarkable. Urinary bladder is unremarkable. Uterus and adnexal structures are unremarkable. Bowel is unremarkable. Appendix is normal. Enteric contrast has reached the rectum. There is a large right effusion and a moderate left effusion. There is mild patchy and linear opacity in both lung bases which may be atelectatic but infectious infiltrates cannot be excluded. No significant skeletal lesion is evident. The abdominal aorta is normal in caliber with moderate atherosclerotic calcification. Extensive coronary artery calcified plaque. IMPRESSION: 1. Small to moderate volume ascites. 2. Cholelithiasis. 3. Negative for bowel obstruction, perforation or focal inflammatory change. 4. Bilateral pleura effusions, right greater than left. Lung base opacities may be atelectatic, but infectious infiltrates cannot be excluded. Electronically Signed   By: Andreas Newport M.D.   On: 12/31/2015 06:28   Dg Chest 2 View  01/07/2016  CLINICAL DATA:  Short of breath, congestive heart failure. EXAM: CHEST  2 VIEW COMPARISON:  01/06/2016 FINDINGS: Mildly enlarged cardiac silhouette. Interval increase in perihilar airspace disease. This increases only mild. Bilateral pleural effusions unchanged. IMPRESSION: No significant change in congestive heart failure pattern. Mild increase in pulmonary edema. Electronically Signed   By: Suzy Bouchard M.D.   On: 01/07/2016 12:28   Dg Chest 2 View  01/05/2016  CLINICAL DATA:  Preoperative evaluation prior to cardiac catheterization which is scheduled for tomorrow. Shortness of breath and generalized weakness. EXAM: CHEST  2 VIEW COMPARISON:  12/31/2015, 11/16/2015 and earlier. FINDINGS: Cardiac silhouette mildly to moderately enlarged, unchanged. Thoracic aorta tortuous and atherosclerotic, unchanged. Hilar and mediastinal contours otherwise unremarkable. Mild interstitial and perihilar  airspace pulmonary edema, slightly worsened since the examination 5 days ago. Moderate-sized bilateral pleural effusions, unchanged, with associated dense consolidation in the lower lobes. Abdominal aortic atherosclerosis without aneurysm. Osseous demineralization, degenerative changes in both shoulders, thoracic dextroscoliosis with compensatory thoracolumbar levoscoliosis. IMPRESSION: 1. Mild CHF with stable mild to moderate cardiomegaly and mild interstitial and perihilar airspace pulmonary edema. This has worsened slightly since the examination 5 days ago. 2. Stable moderate-sized bilateral pleural effusions and associated dense passive atelectasis in the lower lobes. 3. Thoracic and abdominal aortic atherosclerosis. Electronically  Signed   By: Evangeline Dakin M.D.   On: 01/05/2016 14:18   Dg Chest 2 View  12/31/2015  CLINICAL DATA:  Dyspnea, cough and weakness for 3 months. EXAM: CHEST  2 VIEW COMPARISON:  11/16/2015 FINDINGS: There is unchanged moderate cardiomegaly and aortic tortuosity. There is focal airspace consolidation in the left base, as well as curvilinear atelectatic appearing opacities in the lateral lungs. Probable small effusions bilaterally. IMPRESSION: Airspace opacities and effusions bilaterally. This may represent pneumonia. Electronically Signed   By: Andreas Newport M.D.   On: 12/31/2015 00:51   Ct Chest Wo Contrast  01/09/2016  CLINICAL DATA:  Chest pain for the past few days. Shortness of breath. EXAM: CT CHEST WITHOUT CONTRAST TECHNIQUE: Multidetector CT imaging of the chest was performed following the standard protocol without IV contrast. COMPARISON:  Portable chest obtained yesterday. FINDINGS: Mediastinum/Lymph Nodes: Dense coronary artery calcifications. Mildly enlarged heart. Mildly prominent mediastinal lymph nodes without pathological enlargement. Small pericardial effusion with a maximum thickness of 10 mm. Lungs/Pleura: Moderate to large-sized right pleural effusion and  moderate sized left pleural effusion. Bilateral lower lobe compressive atelectasis. Bilateral cylindrical bronchiectasis and patchy densities. No lung nodules. Upper abdomen: Atheromatous aortic calcifications. Musculoskeletal: Thoracic spine degenerative changes. Sternomanubrial and bilateral sternoclavicular degenerative changes. Mild scoliosis. IMPRESSION: 1. Cylindrical bronchiectasis and patchy densities throughout the majority of both lungs, most pronounced in the upper lobes. The patchy densities could represent chronic changes associated with the bronchiectasis or areas of active inflammation or infection. 2. Moderate to large-sized right pleural effusion and moderate-sized left pleural effusion. 3. Bilateral compressive atelectasis. 4. Dense coronary artery atheromatous calcifications. 5. Aortic atherosclerosis. Electronically Signed   By: Claudie Revering M.D.   On: 01/09/2016 16:31   Dg Chest Port 1 View  01/11/2016  CLINICAL DATA:  Acute respiratory failure, acute and chronic CHF, atrial fibrillation EXAM: PORTABLE CHEST 1 VIEW COMPARISON:  Portable chest x-ray of January 09, 2016 FINDINGS: The lungs oral adequately inflated. Confluent interstitial and alveolar opacities persist bilaterally. There are bilateral pleural effusions greater on the right than on the left. The cardiac silhouette is mildly enlarged. There is tortuosity of the ascending and descending thoracic aorta. There is calcification in the wall of the aortic arch. The PICC line tip projects over the mid to distal SVC. IMPRESSION: Persistent confluent interstitial and alveolar opacities consistent with pneumonia with coexisting pulmonary edema. Stable bilateral pleural effusions greater on the right than on the left. Aortic atherosclerosis. Electronically Signed   By: David  Martinique M.D.   On: 01/11/2016 07:21   Dg Chest Port 1 View  01/09/2016  CLINICAL DATA:  Status post PICC placement today. EXAM: PORTABLE CHEST 1 VIEW COMPARISON:  CT  chest today.  Single view of the chest 01/08/2016. FINDINGS: Right PICC is in place with the tip in the lower superior vena cava. Bilateral pleural effusions and extensive airspace disease persist without change. No pneumothorax. Heart size is enlarged. IMPRESSION: Tip of right PICC projects the lower superior vena cava. No change in bilateral effusions and airspace disease. Electronically Signed   By: Inge Rise M.D.   On: 01/09/2016 18:18   Dg Chest Port 1 View  01/08/2016  CLINICAL DATA:  Acute onset of dyspnea.  Initial encounter. EXAM: PORTABLE CHEST 1 VIEW COMPARISON:  Chest radiograph performed 01/07/2016 FINDINGS: Small bilateral pleural effusions are noted. Patchy bilateral airspace opacities may reflect pulmonary edema or possibly pneumonia. No pneumothorax is seen. The cardiomediastinal silhouette is mildly enlarged. No acute osseous abnormalities  are identified. IMPRESSION: Small bilateral pleural effusions noted. Patchy bilateral airspace opacities may reflect pulmonary edema or possibly pneumonia. Mild cardiomegaly. Electronically Signed   By: Garald Balding M.D.   On: 01/08/2016 19:27   Dg Chest Port 1 View  01/06/2016  CLINICAL DATA:  Acute onset of shortness of breath and cough. Preoperative chest radiograph. Initial encounter. EXAM: PORTABLE CHEST 1 VIEW COMPARISON:  Chest radiograph performed 01/05/2016 FINDINGS: Small bilateral pleural effusions are noted. Mildly increased bilateral airspace opacification may reflect pulmonary edema or pneumonia. No pneumothorax is seen. The cardiomediastinal silhouette is mildly enlarged. No acute osseous abnormalities are identified. IMPRESSION: Small bilateral pleural effusions noted. Mildly increased bilateral airspace opacification may reflect pulmonary edema or pneumonia. Mild cardiomegaly. Electronically Signed   By: Garald Balding M.D.   On: 01/06/2016 06:48    Microbiology: Recent Results (from the past 240 hour(s))  MRSA PCR Screening      Status: Abnormal   Collection Time: 01/09/16  3:03 AM  Result Value Ref Range Status   MRSA by PCR POSITIVE (A) NEGATIVE Final    Comment:        The GeneXpert MRSA Assay (FDA approved for NASAL specimens only), is one component of a comprehensive MRSA colonization surveillance program. It is not intended to diagnose MRSA infection nor to guide or monitor treatment for MRSA infections. RESULT CALLED TO, READ BACK BY AND VERIFIED WITH: PMilana Obey RN AT [id number] 01/09/16 BY D. VANHOOK      Labs: Basic Metabolic Panel:  Recent Labs Lab 01/12/16 0900 01/13/16 0420 01/14/16 0250 01/15/16 0614 01/16/16 0455  NA 136 134* 135 134* 131*  K 4.2 4.6 5.2* 4.7 4.6  CL 92* 88* 90* 91* 90*  CO2 35* 36* 39* 37* 35*  GLUCOSE 135* 114* 109* 84 89  BUN 17 16 18 16 16   CREATININE 0.91 0.88 0.96 0.86 0.87  CALCIUM 9.0 9.2 9.2 9.1 8.9   Liver Function Tests: No results for input(s): AST, ALT, ALKPHOS, BILITOT, PROT, ALBUMIN in the last 168 hours. No results for input(s): LIPASE, AMYLASE in the last 168 hours. No results for input(s): AMMONIA in the last 168 hours. CBC:  Recent Labs Lab 01/12/16 0429 01/13/16 0420 01/14/16 0250 01/15/16 0614 01/16/16 0455  WBC 7.5 6.5 6.8 6.5 7.0  HGB 11.1* 10.7* 10.7* 10.4* 10.6*  HCT 35.7* 34.6* 33.7* 32.9* 34.1*  MCV 104.4* 103.3* 102.7* 101.9* 102.7*  PLT 199 181 183 199 198   Cardiac Enzymes: No results for input(s): CKTOTAL, CKMB, CKMBINDEX, TROPONINI in the last 168 hours. BNP: BNP (last 3 results)  Recent Labs  12/30/15 2327 01/07/16 1038 01/15/16 0614  BNP 593.0* 734.1* 337.7*    ProBNP (last 3 results) No results for input(s): PROBNP in the last 8760 hours.  CBG:  Recent Labs Lab 01/15/16 0753 01/15/16 1118 01/15/16 1626 01/15/16 2141 01/16/16 0802  GLUCAP 89 98 125* 156* 82       Signed:  Twania Bujak  [facility] 01/16/2016, 11:14 AM

## 2016-01-16 NOTE — Clinical Social Work Note (Signed)
CSW received insurance approval [id number], patient to be d/c'ed today to [facility].  Patient and family agreeable to plans will transport via ems RN to call report to [phone].  CSW notified patient's son [name] that patient will be discharging today.   Evette Cristal, MSW, [place]

## 2016-01-16 NOTE — Progress Notes (Signed)
Physical Therapy Treatment Patient Details Name: ALEJANDRO MCCALLUM MRN: [id number] DOB: 04-Mar-1932 Today's Date: 01/16/2016    History of Present Illness ILICIA VALLESE is a 82 y.o. female with CAD status post stenting, CHF, paroxysmal atrial fibrillation, diabetes mellitus type 2, hyperlipidemia who was admitted due to acute hypoxic respiratory faiilure with new cardiomyopathy with acute on chronic CHF.  Pt had cardioversion on 7/12 with success to sinur rhythm.    PT Comments    Ms. Cuoco ambulated in her room with min assist but was limited in ambulatory distance due to hypotension, systolic down to 63 (see general comments below).  Additionally, pt reported allergy to mint toothpaste just after brushing her teeth with mint toothpaste (see general comments below for more details).  SNF recommendation remains appropriate.   Follow Up Recommendations  SNF     Equipment Recommendations  Other (comment) (toilet riser)    Recommendations for Other Services       Precautions / Restrictions Precautions Precautions: Fall Precaution Comments: Pt reports allergy to mint toothpaste; monitor O2, HR, BP Restrictions Weight Bearing Restrictions: No    Mobility  Bed Mobility               General bed mobility comments: Pt sitting in recliner chair upon PT arrival  Transfers Overall transfer level: Needs assistance Equipment used: None Transfers: Sit to/from Stand Sit to Stand: Min assist Stand pivot transfers: Min assist       General transfer comment: Min assist for balance with HHA to turn and pivot to chair.  Pt reports her legs feel weak.  Ambulation/Gait Ambulation/Gait assistance: Min assist Ambulation Distance (Feet): 25 Feet Assistive device: 1 person hand held assist Gait Pattern/deviations: Decreased stride length;Trunk flexed;Narrow base of support Gait velocity: decreased   General Gait Details: HHA to steady.  Pt reaches out for bed rail while  ambulating in room and demonstrates flexed posture with a decreased gait speed.   Stairs            Wheelchair Mobility    Modified Rankin (Stroke Patients Only)       Balance Overall balance assessment: Needs assistance;History of Falls Sitting-balance support: No upper extremity supported;Feet supported Sitting balance-Leahy Scale: Good     Standing balance support: Single extremity supported;During functional activity Standing balance-Leahy Scale: Poor Standing balance comment: Relies on UE support                    Cognition Arousal/Alertness: Awake/alert Behavior During Therapy: WFL for tasks assessed/performed Overall Cognitive Status: Impaired/Different from baseline Area of Impairment: Problem solving;Safety/judgement         Safety/Judgement: Decreased awareness of safety   Problem Solving: Slow processing General Comments: See general comments below    Exercises      General Comments General comments (skin integrity, edema, etc.): BP drops from [id number] systolic to [id number] systolic from sit>stand.  After brushing her teeth pt reports dizziness, systolic BP down to 63.  SpO2 down to 82% briefly while brushing teeth but otherwise in the 90s.  HR stable.  **Of note: Pt requested to walk to sink to brush her teeth.  Pt able to prepare and brush her teeth with min guard.  While pt brushing her teeth this therapist said, "That mint smells good", and pt continued to brush her teeth.  Once complete pt discloses to this PT that she is allergic to mint toothpaste reporting that it causes her cheeks and tongue to swell and that the  onset of these symptoms is typically at the end of the day.  This PT asked the pt if the swelling causes her to have a difficult time swallowing or breathing and pt replied, "No".  She says she has known about this allergy for 3 years but "I have not told anyone" and no record of this allergy currently in pt's chart.  RN notified immediately  and pt instructed to notify staff immediately if she finds it harder to breathe or swallow, pt verbalized understanding.      Pertinent Vitals/Pain Pain Assessment: 0-10 Pain Score: 6  Pain Location: buttocks Pain Descriptors / Indicators: Discomfort Pain Intervention(s): Limited activity within patient's tolerance;Monitored during session    Home Living                      Prior Function            PT Goals (current goals can now be found in the care plan section) Acute Rehab PT Goals Patient Stated Goal: to get stronger PT Goal Formulation: With patient Time For Goal Achievement: 01/24/16 Potential to Achieve Goals: Good Progress towards PT goals: Progressing toward goals    Frequency  Min 3X/week    PT Plan Current plan remains appropriate    Co-evaluation             End of Session Equipment Utilized During Treatment: Oxygen;Gait belt Activity Tolerance: Patient limited by fatigue;Treatment limited secondary to medical complications (Comment) (hypotension) Patient left: with call bell/phone within reach;in chair;with chair alarm set     Time: [id number] PT Time Calculation (min) (ACUTE ONLY): 29 min  Charges:  $Gait Training: 8-22 mins $Therapeutic Activity: 8-22 mins                    G Codes:      Collie Siad PT, DPT  Pager: [phone] Phone: [phone] 01/16/2016, 1:42 PM

## 2016-01-17 ENCOUNTER — Other Ambulatory Visit (HOSPITAL_COMMUNITY): Payer: Commercial Managed Care - HMO

## 2016-01-17 ENCOUNTER — Other Ambulatory Visit: Payer: Self-pay | Admitting: *Deleted

## 2016-01-17 ENCOUNTER — Encounter: Payer: Self-pay | Admitting: Internal Medicine

## 2016-01-17 ENCOUNTER — Other Ambulatory Visit: Payer: Commercial Managed Care - HMO

## 2016-01-17 ENCOUNTER — Non-Acute Institutional Stay (SKILLED_NURSING_FACILITY): Payer: Commercial Managed Care - HMO | Admitting: Internal Medicine

## 2016-01-17 DIAGNOSIS — E118 Type 2 diabetes mellitus with unspecified complications: Secondary | ICD-10-CM

## 2016-01-17 DIAGNOSIS — I5022 Chronic systolic (congestive) heart failure: Secondary | ICD-10-CM

## 2016-01-17 DIAGNOSIS — I959 Hypotension, unspecified: Secondary | ICD-10-CM

## 2016-01-17 DIAGNOSIS — I2510 Atherosclerotic heart disease of native coronary artery without angina pectoris: Secondary | ICD-10-CM | POA: Diagnosis not present

## 2016-01-17 DIAGNOSIS — L97529 Non-pressure chronic ulcer of other part of left foot with unspecified severity: Secondary | ICD-10-CM | POA: Diagnosis not present

## 2016-01-17 DIAGNOSIS — I2583 Coronary atherosclerosis due to lipid rich plaque: Secondary | ICD-10-CM

## 2016-01-17 DIAGNOSIS — K5900 Constipation, unspecified: Secondary | ICD-10-CM

## 2016-01-17 DIAGNOSIS — E870 Hyperosmolality and hypernatremia: Secondary | ICD-10-CM

## 2016-01-17 DIAGNOSIS — I482 Chronic atrial fibrillation, unspecified: Secondary | ICD-10-CM

## 2016-01-17 DIAGNOSIS — E785 Hyperlipidemia, unspecified: Secondary | ICD-10-CM

## 2016-01-17 DIAGNOSIS — L89159 Pressure ulcer of sacral region, unspecified stage: Secondary | ICD-10-CM | POA: Diagnosis not present

## 2016-01-17 DIAGNOSIS — E46 Unspecified protein-calorie malnutrition: Secondary | ICD-10-CM

## 2016-01-17 DIAGNOSIS — D509 Iron deficiency anemia, unspecified: Secondary | ICD-10-CM

## 2016-01-17 DIAGNOSIS — J9601 Acute respiratory failure with hypoxia: Secondary | ICD-10-CM

## 2016-01-17 DIAGNOSIS — R5381 Other malaise: Secondary | ICD-10-CM

## 2016-01-17 NOTE — Progress Notes (Signed)
LOCATION: [place]  PCP: Haywood Pao, MD   Code Status: Full Code  Goals of care: Advanced Directive information Advanced Directives 12/31/2015  Does patient have an advance directive? No  Would patient like information on creating an advanced directive? No - patient declined information       Extended Emergency Contact Information Primary Emergency Contact: Gunnar F[place] Mobile Phone: [phone] Relation: Daughter Secondary Emergency Contact: Douglas,Randy  [place] Mobile Phone: [phone] Relation: Son   Allergies  Allergen Reactions  . Demerol Other (See Comments)    Burning sensation all over body  . Erythromycin Nausea And Vomiting  . Penicillins Other (See Comments)    unknown  . Percocet [Oxycodone-Acetaminophen] Swelling    Chief Complaint  Patient presents with  . New Admit To SNF    New Admission     HPI:  Patient is a 82 y.o. female seen today for short term rehabilitation post hospital admission from 12/30/15-01/16/16 with acute respiratory failure with CHF exacerbation with EF 20-25%, pneumonia, pleural effusion and possible amiodarone toxicity. Cardiology, pulmonology and EP were on board. She required diuresis. Amiodarone was discontinued. DVT was ruled out. Metoprolol was held with hypotension. She was placed on o2. She had chest pain and with her history of CAD and stent, she underwent cardiac catheterization on 01/06/16 showing widely patent stent with minimal CAD in LAD. She had afib and required DCCV. Her digoxin and coumadin were continued.  She has PMH of CAD s/p stent, CHF, afib, DM type 2 among others. She is seen in her room today. Her son is present at bedside.   Review of Systems:  Constitutional: Negative for fever, chills. Feels weak and tired.  HENT: Negative for headache, congestion, nasal discharge Eyes: Negative for blurred vision, double vision and discharge.  Respiratory:  Negative for cough, shortness of breath and wheezing. On 2 liters of oxygen.  Cardiovascular: Negative for chest pain, palpitations Gastrointestinal: Negative for heartburn,vomiting, abdominal pain. Positive for nausea. Last bowel movement was on Sunday.  Genitourinary: Negative for dysuria and flank pain.  Musculoskeletal: Negative for back pain, fall in the facility.  Skin: Negative for itching, rash.  Neurological: Positive for dizziness with change of position. Psychiatric/Behavioral: Negative for depression.   Past Medical History  Diagnosis Date  . CAD (coronary artery disease)     a. 05/2006 Cath/PCI: LAD 7m, D1 40 ost, LCX nl, OM1 95 (3.0x20 Taxus DES), RCA nl, EF 40% w/ lat AK;  04/2007 Ex MV: minimal lat ischemia in area of prior infarct->low risk-> med Rx.  [name] Hyperlipidemia   . Diabetes mellitus   . MVP (mitral valve prolapse)     a. 10/2003 Echo: nl LV fxn, mild MR/TR/AS  . Myocardial infarction ([place])   . Sleep apnea   . Transient ischemic attack   . Arthritis   . Baker's cyst of knee   . Chronic atrial fibrillation (HCC)     a. on Coumadin  . Chronic systolic (congestive) heart failure ([place])     a. 04/2015: EF 50-55% b. echo 12/2015: EF 20-25% w/ diffuse HK, biatrial enlargement, mild AI and MR, severe TR     Past Surgical History  Procedure Laterality Date  . Tonsillectomy    . Coronary angioplasty      Status post PTCA and stenting of the first obtuse marginal-05/13/2006. We placed a 3.0 x 20 mm Taxus stent. It was post dilated using a 3.25 mm noncompliant balloon up  to 14 atmospheres  . Cataracts    . Atrial flutter ablation N/A 10/09/2012    Procedure: ATRIAL FLUTTER ABLATION;  Surgeon: Thompson Grayer, MD;  Location: [facility] CATH LAB;  Service: Cardiovascular;  Laterality: N/A;  . Colonoscopy N/A 10/13/2015    Procedure: COLONOSCOPY;  Surgeon: Manus Gunning, MD;  Location: [facility] ENDOSCOPY;  Service: Gastroenterology;  Laterality: N/A;  . Cardiac catheterization N/A  01/06/2016    Procedure: Right/Left Heart Cath and Coronary Angiography;  Surgeon: Belva Crome, MD;  Location: [facility] CV LAB;  Service: Cardiovascular;  Laterality: N/A;  . Cardioversion N/A 01/11/2016    Procedure: CARDIOVERSION;  Surgeon: Larey Dresser, MD;  Location: [place];  Service: Cardiovascular;  Laterality: N/A;   Social History:   reports that she quit smoking about 52 years ago. Her smoking use included Cigarettes. She smoked 1.00 pack per day. She has never used smokeless tobacco. She reports that she does not drink alcohol or use illicit drugs.  Family History  Problem Relation Age of Onset  . Heart attack Father   . Heart attack Mother   . Heart attack Brother   . Coronary artery disease Sister     CABG  . Alzheimer's disease Sister     X3  . Stroke Mother   . Stroke Sister   . Heart attack Sister     Medications:   Medication List       This list is accurate as of: 01/17/16  3:07 PM.  Always use your most recent med list.               ACCU-CHEK SMARTVIEW test strip  Generic drug:  glucose blood     acetaminophen 325 MG tablet  Commonly known as:  TYLENOL  Take 2 tablets (650 mg total) by mouth every 4 (four) hours as needed for headache or mild pain.     ALPRAZolam 0.25 MG tablet  Commonly known as:  XANAX  Take 1 tablet (0.25 mg total) by mouth daily as needed for anxiety.     atorvastatin 20 MG tablet  Commonly known as:  LIPITOR  TAKE ONE TABLET BY MOUTH ONE TIME DAILY     benzonatate 200 MG capsule  Commonly known as:  TESSALON  Take 1 capsule (200 mg total) by mouth 3 (three) times daily as needed for cough.     Digoxin 62.5 MCG Tabs  Take 0.0625 mg by mouth every other day. Start on 01/18/16.  Start taking on:  01/18/2016     ferrous sulfate 325 (65 FE) MG tablet  Take 325 mg by mouth daily with breakfast.     furosemide 40 MG tablet  Commonly known as:  LASIX  Take 1 tablet (40 mg total) by mouth daily.      HYDROcodone-acetaminophen 5-325 MG tablet  Commonly known as:  NORCO/VICODIN  Take 0.5-1 tablets by mouth every 4 (four) hours as needed for severe pain.     hydrocortisone 1 % Crea  Commonly known as:  PROCTO-PAK  Apply 1 application topically daily.     losartan 25 MG tablet  Commonly known as:  COZAAR  Take 0.5 tablets (12.5 mg total) by mouth every evening.     metFORMIN 500 MG tablet  Commonly known as:  GLUCOPHAGE  Take 500 mg by mouth 2 (two) times daily.     mupirocin ointment 2 %  Commonly known as:  BACTROBAN  Place into the nose 2 (two) times daily.  nitroGLYCERIN 0.4 MG SL tablet  Commonly known as:  NITROSTAT  Place 0.4 mg under the tongue every 5 (five) minutes x 3 doses as needed for chest pain. Up to 3 doses, if pain continues, call 911.     ranolazine 500 MG 12 hr tablet  Commonly known as:  RANEXA  Take 1 tablet (500 mg total) by mouth 2 (two) times daily.     saccharomyces boulardii 250 MG capsule  Commonly known as:  FLORASTOR  Take 1 capsule (250 mg total) by mouth 2 (two) times daily.     SANTYL ointment  Generic drug:  collagenase  Apply 1 application topically daily.     spironolactone 25 MG tablet  Commonly known as:  ALDACTONE  Take 0.5 tablets (12.5 mg total) by mouth daily.     VITAMIN B-12 PO  Take 1 tablet by mouth daily.     VITAMIN B-6 PO  Take 1 tablet by mouth daily.     VITAMIN D PO  Take 1 tablet by mouth daily.        Immunizations: Immunization History  Administered Date(s) Administered  . PPD Test 01/16/2016  . Tdap 11/16/2015     Physical Exam:  Filed Vitals:   01/17/16 1501  BP: 126/60  Pulse: 79  Temp: 97 F (36.1 C)  TempSrc: Oral  Resp: 18  Height: 5\' 2"  (1.575 m)  Weight: 131 lb (59.421 kg)  SpO2: 93%   Body mass index is 23.95 kg/(m^2).  General- elderly female, well built, in no acute distress Head- normocephalic, atraumatic Nose- no maxillary or frontal sinus tenderness, no nasal  discharge Throat- moist mucus membrane Eyes- PERRLA, EOMI, no pallor, no icterus, no discharge, normal conjunctiva, normal sclera Neck- no cervical lymphadenopathy Cardiovascular- normal [id number], + systolic murmur, no leg edema Respiratory- bilateral clear to auscultation, no wheeze, no rhonchi, no crackles, no use of accessory muscles Abdomen- bowel sounds present, soft, mild diffuse tenderness +, no guarding or rigidity Musculoskeletal- able to move all 4 extremities,generalized weakness Neurological- alert and oriented to person, place and time Skin- warm and dry, dressing to sacral area and left great toe clean and dry Psychiatry- normal mood and affect    Labs reviewed: Basic Metabolic Panel:  Recent Labs  01/02/16 0306  01/04/16 0325  01/14/16 0250 01/15/16 0614 01/16/16 0455  NA 136  < > 139  < > 135 134* 131*  K 3.3*  < > 4.2  < > 5.2* 4.7 4.6  CL 102  < > 101  < > 90* 91* 90*  CO2 27  < > 31  < > 39* 37* 35*  GLUCOSE 123*  < > 169*  < > 109* 84 89  BUN 18  < > 17  < > 18 16 16   CREATININE 0.93  < > 0.86  < > 0.96 0.86 0.87  CALCIUM 8.1*  < > 8.1*  < > 9.2 9.1 8.9  MG 1.3*  --  1.7  --   --   --   --   < > = values in this interval not displayed. Liver Function Tests:  Recent Labs  12/27/15 1708 12/30/15 2327 12/31/15 0525  AST 30 39 34  ALT 27 28 27   ALKPHOS 211* 202* 197*  BILITOT 1.1 1.3* 1.2  PROT 6.0* 5.9* 6.0*  ALBUMIN 3.2* 2.7* 2.5*    Recent Labs  12/30/15 2327 12/31/15 0525  LIPASE 41 38   No results for input(s): AMMONIA in the last  8760 hours. CBC:  Recent Labs  12/01/15 1502 12/27/15 1708  12/31/15 0525  01/14/16 0250 01/15/16 0614 01/16/16 0455  WBC 7.2 7.9  < > 8.1  < > 6.8 6.5 7.0  NEUTROABS 5832 6794  --  6.3  --   --   --   --   HGB 10.5* 12.0  < > 11.7*  < > 10.7* 10.4* 10.6*  HCT 33.1* 36.8  < > 37.0  < > 33.7* 32.9* 34.1*  MCV 101.5* 101.9*  < > 103.9*  < > 102.7* 101.9* 102.7*  PLT 267 259  < > 263  < > 183 199 198  <  > = values in this interval not displayed. Cardiac Enzymes:  Recent Labs  12/31/15 0525 12/31/15 1022 12/31/15 1614  TROPONINI 0.07* 0.06* 0.05*   BNP: Invalid input(s): POCBNP CBG:  Recent Labs  01/16/16 0802 01/16/16 1143 01/16/16 1632  GLUCAP 82 133* 159*    Radiological Exams: Ct Abdomen Pelvis Wo Contrast  12/31/2015  CLINICAL DATA:  Abdominal pain and nausea for 3 days EXAM: CT ABDOMEN AND PELVIS WITHOUT CONTRAST TECHNIQUE: Multidetector CT imaging of the abdomen and pelvis was performed following the standard protocol without IV contrast. COMPARISON:  None. FINDINGS: There is small to moderate volume ascites. There are unremarkable unenhanced appearances of the liver, spleen, pancreas and adrenals. There are several calculi in the gallbladder lumen measuring up to 4 mm. Kidneys are unremarkable. Urinary bladder is unremarkable. Uterus and adnexal structures are unremarkable. Bowel is unremarkable. Appendix is normal. Enteric contrast has reached the rectum. There is a large right effusion and a moderate left effusion. There is mild patchy and linear opacity in both lung bases which may be atelectatic but infectious infiltrates cannot be excluded. No significant skeletal lesion is evident. The abdominal aorta is normal in caliber with moderate atherosclerotic calcification. Extensive coronary artery calcified plaque. IMPRESSION: 1. Small to moderate volume ascites. 2. Cholelithiasis. 3. Negative for bowel obstruction, perforation or focal inflammatory change. 4. Bilateral pleura effusions, right greater than left. Lung base opacities may be atelectatic, but infectious infiltrates cannot be excluded. Electronically Signed   By: Andreas Newport M.D.   On: 12/31/2015 06:28   Dg Chest 2 View  01/07/2016  CLINICAL DATA:  Short of breath, congestive heart failure. EXAM: CHEST  2 VIEW COMPARISON:  01/06/2016 FINDINGS: Mildly enlarged cardiac silhouette. Interval increase in perihilar  airspace disease. This increases only mild. Bilateral pleural effusions unchanged. IMPRESSION: No significant change in congestive heart failure pattern. Mild increase in pulmonary edema. Electronically Signed   By: Suzy Bouchard M.D.   On: 01/07/2016 12:28   Dg Chest 2 View  01/05/2016  CLINICAL DATA:  Preoperative evaluation prior to cardiac catheterization which is scheduled for tomorrow. Shortness of breath and generalized weakness. EXAM: CHEST  2 VIEW COMPARISON:  12/31/2015, 11/16/2015 and earlier. FINDINGS: Cardiac silhouette mildly to moderately enlarged, unchanged. Thoracic aorta tortuous and atherosclerotic, unchanged. Hilar and mediastinal contours otherwise unremarkable. Mild interstitial and perihilar airspace pulmonary edema, slightly worsened since the examination 5 days ago. Moderate-sized bilateral pleural effusions, unchanged, with associated dense consolidation in the lower lobes. Abdominal aortic atherosclerosis without aneurysm. Osseous demineralization, degenerative changes in both shoulders, thoracic dextroscoliosis with compensatory thoracolumbar levoscoliosis. IMPRESSION: 1. Mild CHF with stable mild to moderate cardiomegaly and mild interstitial and perihilar airspace pulmonary edema. This has worsened slightly since the examination 5 days ago. 2. Stable moderate-sized bilateral pleural effusions and associated dense passive atelectasis in the lower lobes.  3. Thoracic and abdominal aortic atherosclerosis. Electronically Signed   By: Evangeline Dakin M.D.   On: 01/05/2016 14:18   Dg Chest 2 View  12/31/2015  CLINICAL DATA:  Dyspnea, cough and weakness for 3 months. EXAM: CHEST  2 VIEW COMPARISON:  11/16/2015 FINDINGS: There is unchanged moderate cardiomegaly and aortic tortuosity. There is focal airspace consolidation in the left base, as well as curvilinear atelectatic appearing opacities in the lateral lungs. Probable small effusions bilaterally. IMPRESSION: Airspace opacities and  effusions bilaterally. This may represent pneumonia. Electronically Signed   By: Andreas Newport M.D.   On: 12/31/2015 00:51   Ct Chest Wo Contrast  01/09/2016  CLINICAL DATA:  Chest pain for the past few days. Shortness of breath. EXAM: CT CHEST WITHOUT CONTRAST TECHNIQUE: Multidetector CT imaging of the chest was performed following the standard protocol without IV contrast. COMPARISON:  Portable chest obtained yesterday. FINDINGS: Mediastinum/Lymph Nodes: Dense coronary artery calcifications. Mildly enlarged heart. Mildly prominent mediastinal lymph nodes without pathological enlargement. Small pericardial effusion with a maximum thickness of 10 mm. Lungs/Pleura: Moderate to large-sized right pleural effusion and moderate sized left pleural effusion. Bilateral lower lobe compressive atelectasis. Bilateral cylindrical bronchiectasis and patchy densities. No lung nodules. Upper abdomen: Atheromatous aortic calcifications. Musculoskeletal: Thoracic spine degenerative changes. Sternomanubrial and bilateral sternoclavicular degenerative changes. Mild scoliosis. IMPRESSION: 1. Cylindrical bronchiectasis and patchy densities throughout the majority of both lungs, most pronounced in the upper lobes. The patchy densities could represent chronic changes associated with the bronchiectasis or areas of active inflammation or infection. 2. Moderate to large-sized right pleural effusion and moderate-sized left pleural effusion. 3. Bilateral compressive atelectasis. 4. Dense coronary artery atheromatous calcifications. 5. Aortic atherosclerosis. Electronically Signed   By: Claudie Revering M.D.   On: 01/09/2016 16:31   Dg Chest Port 1 View  01/11/2016  CLINICAL DATA:  Acute respiratory failure, acute and chronic CHF, atrial fibrillation EXAM: PORTABLE CHEST 1 VIEW COMPARISON:  Portable chest x-ray of January 09, 2016 FINDINGS: The lungs oral adequately inflated. Confluent interstitial and alveolar opacities persist bilaterally.  There are bilateral pleural effusions greater on the right than on the left. The cardiac silhouette is mildly enlarged. There is tortuosity of the ascending and descending thoracic aorta. There is calcification in the wall of the aortic arch. The PICC line tip projects over the mid to distal SVC. IMPRESSION: Persistent confluent interstitial and alveolar opacities consistent with pneumonia with coexisting pulmonary edema. Stable bilateral pleural effusions greater on the right than on the left. Aortic atherosclerosis. Electronically Signed   By: David  Martinique M.D.   On: 01/11/2016 07:21   Dg Chest Port 1 View  01/09/2016  CLINICAL DATA:  Status post PICC placement today. EXAM: PORTABLE CHEST 1 VIEW COMPARISON:  CT chest today.  Single view of the chest 01/08/2016. FINDINGS: Right PICC is in place with the tip in the lower superior vena cava. Bilateral pleural effusions and extensive airspace disease persist without change. No pneumothorax. Heart size is enlarged. IMPRESSION: Tip of right PICC projects the lower superior vena cava. No change in bilateral effusions and airspace disease. Electronically Signed   By: Inge Rise M.D.   On: 01/09/2016 18:18   Dg Chest Port 1 View  01/08/2016  CLINICAL DATA:  Acute onset of dyspnea.  Initial encounter. EXAM: PORTABLE CHEST 1 VIEW COMPARISON:  Chest radiograph performed 01/07/2016 FINDINGS: Small bilateral pleural effusions are noted. Patchy bilateral airspace opacities may reflect pulmonary edema or possibly pneumonia. No pneumothorax is seen. The cardiomediastinal silhouette  is mildly enlarged. No acute osseous abnormalities are identified. IMPRESSION: Small bilateral pleural effusions noted. Patchy bilateral airspace opacities may reflect pulmonary edema or possibly pneumonia. Mild cardiomegaly. Electronically Signed   By: Garald Balding M.D.   On: 01/08/2016 19:27   Dg Chest Port 1 View  01/06/2016  CLINICAL DATA:  Acute onset of shortness of breath and  cough. Preoperative chest radiograph. Initial encounter. EXAM: PORTABLE CHEST 1 VIEW COMPARISON:  Chest radiograph performed 01/05/2016 FINDINGS: Small bilateral pleural effusions are noted. Mildly increased bilateral airspace opacification may reflect pulmonary edema or pneumonia. No pneumothorax is seen. The cardiomediastinal silhouette is mildly enlarged. No acute osseous abnormalities are identified. IMPRESSION: Small bilateral pleural effusions noted. Mildly increased bilateral airspace opacification may reflect pulmonary edema or pneumonia. Mild cardiomegaly. Electronically Signed   By: Garald Balding M.D.   On: 01/06/2016 06:48    Assessment/Plan  Physical deconditioning Will have her work with physical therapy and occupational therapy team to help with gait training and muscle strengthening exercises.fall precautions. Skin care. Encourage to be out of bed.   Hypotension Low BP and complaints of dizziness. Check orthostatic vital signs daily for now. Discontinue her losartan for now and add holding parameter for lasix and spironolactone and monitor. Check cbc and bmp  Acute respiratory failure Continue o2 by nasal canula for now and wean off as tolerated  CHF With EF reviewed of 20-25%. Continue lasix 40 mg daily and spironolactone 12.5 mg daily current regimen with holding parameters. Monitor daily weight  Diabetic toe wound To left toe, continue wound care and monitor for signs of infection, continue norco as below for pain.   Sacral pressure ulcer Continue wound care with pressure ulcer prophylaxis. Add decubivite to promote wound healing. Continue norco 5-325 half to 1 tab q4h prn severe pain  CAD Chest pain free. S/p catheterization showing patent stent. Continue statin and ranexa and prn NTG. Hold lisinopril for now.   afib Rate controlled. resume digoxin 0.0625 mg qod from 01/18/16. Off amiodarone at present. Metoprolol held in hospital with low BP reading. Continue coumadin  for anticoagulation. iNR today 2.7.   Protein calorie malnutrition RD to evaluate, monitor po intake and weight  Constipation With abdominal discomfort. Add senna s 2 tab qhs and miralax daily for now and monitor  Iron def anemia Monitor cbc. Continue feso4 325 mg daily  Hypernatremia Monitor BMP  HLD Continue lipitor  DM type 2 Monitor cbg. Continue metformin 500 mg bid. Continue wound care.    Goals of care: short term rehabilitation   Labs/tests ordered: cbc, cmp   Family/ staff Communication: reviewed care plan with patient, her son and nursing supervisor    Blanchie Serve, MD Internal Medicine [place], [place] [address] Cell Phone (Monday-Friday 8 am - 5 pm): [phone] On Call: [phone] and follow prompts after 5 pm and on weekends Office Phone: [phone] Office Fax: [phone]

## 2016-01-17 NOTE — Patient Outreach (Signed)
[facility]) Care Management  01/17/2016  LARIN GRAVETT 02-15-1932 [id number]  RN  spoke with pt's son [name]) and introduced Therapist, sports and purpose for today's call. Son who is the primary caregiver for this pt indicates pt was discharged from the hospital to [facility] on yesterday and will probably discharge to his home for her ongoing recovery after SNF.   RN will alert hospital liaison to refer to social work to follow up accordingly for potential community upon pt's discharge form the facility.  Raina Mina, RN Care Management Coordinator [place] Office [phone]

## 2016-01-18 ENCOUNTER — Ambulatory Visit: Payer: Commercial Managed Care - HMO | Admitting: Cardiovascular Disease

## 2016-01-25 ENCOUNTER — Encounter (HOSPITAL_COMMUNITY): Payer: Self-pay

## 2016-01-25 ENCOUNTER — Ambulatory Visit (HOSPITAL_COMMUNITY)
Admission: RE | Admit: 2016-01-25 | Discharge: 2016-01-25 | Disposition: A | Discharge status: Home / Self Care | Payer: Commercial Managed Care - HMO | Source: Ambulatory Visit | Attending: Cardiology | Admitting: Cardiology

## 2016-01-25 VITALS — BP 102/50 | HR 65 | Wt 118.0 lb

## 2016-01-25 DIAGNOSIS — Z79899 Other long term (current) drug therapy: Secondary | ICD-10-CM | POA: Diagnosis not present

## 2016-01-25 DIAGNOSIS — I2510 Atherosclerotic heart disease of native coronary artery without angina pectoris: Secondary | ICD-10-CM | POA: Diagnosis not present

## 2016-01-25 DIAGNOSIS — E785 Hyperlipidemia, unspecified: Secondary | ICD-10-CM | POA: Insufficient documentation

## 2016-01-25 DIAGNOSIS — I428 Other cardiomyopathies: Secondary | ICD-10-CM | POA: Diagnosis not present

## 2016-01-25 DIAGNOSIS — Z823 Family history of stroke: Secondary | ICD-10-CM | POA: Insufficient documentation

## 2016-01-25 DIAGNOSIS — Z7901 Long term (current) use of anticoagulants: Secondary | ICD-10-CM | POA: Diagnosis not present

## 2016-01-25 DIAGNOSIS — Z7984 Long term (current) use of oral hypoglycemic drugs: Secondary | ICD-10-CM | POA: Diagnosis not present

## 2016-01-25 DIAGNOSIS — Z8673 Personal history of transient ischemic attack (TIA), and cerebral infarction without residual deficits: Secondary | ICD-10-CM | POA: Insufficient documentation

## 2016-01-25 DIAGNOSIS — Z955 Presence of coronary angioplasty implant and graft: Secondary | ICD-10-CM | POA: Insufficient documentation

## 2016-01-25 DIAGNOSIS — I480 Paroxysmal atrial fibrillation: Secondary | ICD-10-CM

## 2016-01-25 DIAGNOSIS — Z8249 Family history of ischemic heart disease and other diseases of the circulatory system: Secondary | ICD-10-CM | POA: Diagnosis not present

## 2016-01-25 DIAGNOSIS — I5022 Chronic systolic (congestive) heart failure: Secondary | ICD-10-CM

## 2016-01-25 DIAGNOSIS — I2583 Coronary atherosclerosis due to lipid rich plaque: Secondary | ICD-10-CM

## 2016-01-25 DIAGNOSIS — Z87891 Personal history of nicotine dependence: Secondary | ICD-10-CM | POA: Insufficient documentation

## 2016-01-25 DIAGNOSIS — E119 Type 2 diabetes mellitus without complications: Secondary | ICD-10-CM | POA: Insufficient documentation

## 2016-01-25 LAB — BASIC METABOLIC PANEL
ANION GAP: 6 (ref 5–15)
BUN: 10 mg/dL (ref 6–20)
CALCIUM: 9.4 mg/dL (ref 8.9–10.3)
CO2: 28 mmol/L (ref 22–32)
Chloride: 100 mmol/L — ABNORMAL LOW (ref 101–111)
Creatinine, Ser: 0.76 mg/dL (ref 0.44–1.00)
GLUCOSE: 95 mg/dL (ref 65–99)
Potassium: 4.4 mmol/L (ref 3.5–5.1)
Sodium: 134 mmol/L — ABNORMAL LOW (ref 135–145)

## 2016-01-25 LAB — DIGOXIN LEVEL: Digoxin Level: 0.3 ng/mL — ABNORMAL LOW (ref 0.8–2.0)

## 2016-01-25 LAB — BRAIN NATRIURETIC PEPTIDE: B Natriuretic Peptide: 265.4 pg/mL — ABNORMAL HIGH (ref 0.0–100.0)

## 2016-01-25 MED ORDER — LOSARTAN POTASSIUM 25 MG PO TABS: 12.5000 mg | ORAL_TABLET | Freq: Two times a day (BID) | ORAL | 0 refills | Status: DC

## 2016-01-25 NOTE — Patient Instructions (Signed)
Increase Losartan to 12.5 mg Twice daily   Labs today  Labs in 1 week at [place] physician recommends that you schedule a follow-up appointment in: 3 weeks

## 2016-01-25 NOTE — Progress Notes (Signed)
Patient ID: Shannon [name], female   DOB: 08-31-1931, 82 y.o.   MRN: [id number] PCP: Dr. Osborne Casco  82 yo with paroxysmal atrial fibrillation, chronic systolic CHF, and CAD presents for cardiology followup.  She apparently had been in persistent atrial fibrillation since around 8/16.  It appears that she was tachycardic most of the time prior to her recent admission in 7/17.  She had been on amiodarone prior to admission but it was not keeping her in NSR.  She was admitted with acute systolic CHF.  EF had dropped to 20-25% in 7/17 from 50-55% in 10/16.  She was in atrial fibrillation with RVR.  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  Amiodarone was stopped due to suspicion for amiodarone-induced lung toxicity (she was seen by pulmonary).  She was eventually cardioverted to NSR.  Ranolazine was started to try to keep her in NSR.  HR was low in the hospital so she has not been on a beta blocker.  She was extensively diuresed and discharged to SNF.    She is still wearing oxygen all the time.  She has felt better over the last 3 days.  Using wheelchair most of the time but also walking some with walker with PT.  Mildly short of breath walking with walker but improving.  She remains in NSR, no palpitations.  No chest pain, orthopnea/PND. No lightheadedness/syncope. She fatigues easily.   ECG: NSR, LAFB, septal Qs, QTc 434 msec  Labs (7/17): Digoxin 1.6, K 4.6, creatinine 0.87, HCT 34.1  PMH: 1. Atrial fibrillation: Paroxysmal.  Had been persistent since 8/16, was cardioverted to NSR during 7/17 admission.  2. 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  She had been in atrial fibrillation with elevated rate possibly for months prior to 7/17 admission.  - Echo (10/16): EF 50-55%.  - Echo (7/17): EF 20-25%, mildly decreased RV systolic function, severe TR, PA systolic pressure 64 mmHg.  - LHC/RHC (7/17): Patent OM stent, no  obstructive CAD.  Mean RA 13, PA 45/25, mean PCWP 16, CI 2.36.  3. CAD: DES to OM in 2007.  LHC (7/17) with patent stent and no new obstructive disease.  4. Type II diabetes 5. Hyperlipidemia 6. TIA 7. Possible amiodarone lung toxicity: Chest CT (7/17) with bronchiectasis/fibrosis, patchy bilateral densities.   Social History   Social History  . Marital status: Married    Spouse name: N/A  . Number of children: N/A  . Years of education: N/A   Occupational History  . Not on file.   Social History Main Topics  . Smoking status: Former Smoker    Packs/day: 1.00    Types: Cigarettes    Quit date: 07/03/1963  . Smokeless tobacco: Never Used  . Alcohol use No  . Drug use: No  . Sexual activity: No   Other Topics Concern  . Not on file   Social History Narrative   Lives in [place] with husband.  Does not routinely exercise but is able to keep house and go food shopping.   Family History  Problem Relation Age of Onset  . Heart attack Father   . Heart attack Mother   . Heart attack Brother   . Coronary artery disease Sister     CABG  . Alzheimer's disease Sister     X3  . Stroke Mother   . Stroke Sister   . Heart attack Sister    ROS: All systems reviewed and negative except as per HPI.  Current Outpatient Prescriptions  Medication Sig Dispense Refill  . ACCU-CHEK SMARTVIEW test strip     . acetaminophen (TYLENOL) 325 MG tablet Take 2 tablets (650 mg total) by mouth every 4 (four) hours as needed for headache or mild pain.    [name] ALPRAZolam (XANAX) 0.25 MG tablet Take 1 tablet (0.25 mg total) by mouth daily as needed for anxiety. 30 tablet 0  . atorvastatin (LIPITOR) 20 MG tablet TAKE ONE TABLET BY MOUTH ONE TIME DAILY 90 tablet 3  . benzonatate (TESSALON) 200 MG capsule Take 1 capsule (200 mg total) by mouth 3 (three) times daily as needed for cough. 20 capsule 0  . Cholecalciferol (VITAMIN D PO) Take 1 tablet by mouth daily.    . Cyanocobalamin (VITAMIN B-12 PO) Take 1  tablet by mouth daily.    . digoxin 62.5 MCG TABS Take 0.0625 mg by mouth every other day. Start on 01/18/16. 15 tablet 0  . ferrous sulfate 325 (65 FE) MG tablet Take 325 mg by mouth daily with breakfast.    . furosemide (LASIX) 40 MG tablet Take 1 tablet (40 mg total) by mouth daily. 30 tablet 0  . HYDROcodone-acetaminophen (NORCO/VICODIN) 5-325 MG tablet Take 0.5-1 tablets by mouth every 4 (four) hours as needed for severe pain. 20 tablet 0  . hydrocortisone (PROCTO-PAK) 1 % CREA Apply 1 application topically daily. 1 Tube 0  . losartan (COZAAR) 25 MG tablet Take 0.5 tablets (12.5 mg total) by mouth 2 (two) times daily. 30 tablet 0  . metFORMIN (GLUCOPHAGE) 500 MG tablet Take 500 mg by mouth 2 (two) times daily.     . mupirocin ointment (BACTROBAN) 2 % Place into the nose 2 (two) times daily. 22 g 0  . ondansetron (ZOFRAN) 4 MG tablet Take 4 mg by mouth every 8 (eight) hours as needed for nausea or vomiting.    . Pyridoxine HCl (VITAMIN B-6 PO) Take 1 tablet by mouth daily.    . ranolazine (RANEXA) 500 MG 12 hr tablet Take 1 tablet (500 mg total) by mouth 2 (two) times daily. 60 tablet 0  . saccharomyces boulardii (FLORASTOR) 250 MG capsule Take 1 capsule (250 mg total) by mouth 2 (two) times daily. 30 capsule 0  . SANTYL ointment Apply 1 application topically daily.     [name] senna (SENOKOT) 8.6 MG tablet Take 2 tablets by mouth daily.    [name] spironolactone (ALDACTONE) 25 MG tablet Take 0.5 tablets (12.5 mg total) by mouth daily. 30 tablet 0  . nitroGLYCERIN (NITROSTAT) 0.4 MG SL tablet Place 0.4 mg under the tongue every 5 (five) minutes x 3 doses as needed for chest pain. Up to 3 doses, if pain continues, call 911.     No current facility-administered medications for this encounter.    BP (!) 102/50   Pulse 65   Wt 118 lb (53.5 kg)   SpO2 99% Comment: on 1.5L of O2  BMI 21.58 kg/m  General: NAD, frail Neck: No JVD, no thyromegaly or thyroid nodule.  Lungs: Clear to auscultation bilaterally  with normal respiratory effort. CV: Nondisplaced PMI.  Heart regular S1/S2, no S3/S4, 2/6 HSM LLSB.  No peripheral edema.  No carotid bruit.  Normal pedal pulses.  Abdomen: Soft, nontender, no hepatosplenomegaly, no distention.  Skin: Intact without lesions or rashes.  Neurologic: Alert and oriented x 3.  Psych: Normal affect. Extremities: No clubbing or cyanosis.  HEENT: Normal.   Assessment/Plan: 1. Chronic systolic CHF: EF 0000000 on 7/17 echo.  Nonischemic  cardiomyopathy, possible tachycardia-mediated cardiomyopathy. She is now in NSR.  NYHA class III symptoms.  She does not appear volume overloaded on exam.  - Continue current Lasix 40 mg daily.  BMET/BNP today.  - Continue digoxin, lowered dose after last level.  Repeat level today.  - Increase losartan to 12.5 mg bid.  BMET in 10 days.  - Continue spironolactone 12.5 daily.  - No beta blocker yet due to bradycardia when in the hospital.  2. Atrial fibrillation: Paroxysmal, now in NSR on ranolazine.  Need to keep in NSR given concern for tachy-mediated cardiomyopathy.  She had suspected amiodarone-induced lung toxicity and would not be able to start dofetilide for several months given need for amiodarone wash-out.  - Continue ranolazine, QTc ok on ECG today . - Continue warfarin, needs INR followed at SNF => need to call to make sure she is getting this as not on her med list.  - If atrial fibrillation/RVR recurs, would consider ablate/pace.  3. CAD: Patent OM stent on coronary angiography in 7/17.  Continue statin. No ASA with warfarin use.  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  Off amiodarone.  To see pulmonary next week.   [name] 01/26/2016

## 2016-01-26 ENCOUNTER — Telehealth (HOSPITAL_COMMUNITY): Payer: Self-pay | Admitting: *Deleted

## 2016-01-26 ENCOUNTER — Inpatient Hospital Stay: Payer: Commercial Managed Care - HMO | Admitting: Acute Care

## 2016-01-26 DIAGNOSIS — I480 Paroxysmal atrial fibrillation: Secondary | ICD-10-CM | POA: Insufficient documentation

## 2016-01-26 NOTE — Telephone Encounter (Signed)
-----   Message from Larey Dresser, MD sent at 01/26/2016 12:09 AM EDT ----- Please call asap to make sure that this patient is on warfarin.  Noted that it is not on her med list.  Should be on it.  Has been followed at [facility] coumadin clinic.

## 2016-01-30 ENCOUNTER — Non-Acute Institutional Stay (SKILLED_NURSING_FACILITY): Payer: Commercial Managed Care - HMO | Admitting: Adult Health

## 2016-01-30 ENCOUNTER — Encounter: Payer: Self-pay | Admitting: Adult Health

## 2016-01-30 DIAGNOSIS — Z7901 Long term (current) use of anticoagulants: Secondary | ICD-10-CM

## 2016-01-30 DIAGNOSIS — I482 Chronic atrial fibrillation, unspecified: Secondary | ICD-10-CM

## 2016-01-30 NOTE — Progress Notes (Signed)
Patient ID: Shannon [name], female   DOB: 12/07/1931, 82 y.o.   MRN: [id number] Subjective:     Indication: atrial fibrillation Bleeding signs/symptoms: None Thromboembolic signs/symptoms: None  Missed Coumadin doses: This week - 6 due to supratherapeutic INR Medication changes: no Dietary changes: no Bacterial/viral infection: no Other concerns: no  The following portions of the patient's history were reviewed and updated as appropriate: allergies, current medications, past family history, past medical history, past social history, past surgical history and problem list.  Review of Systems A comprehensive review of systems was negative.   Objective:    INR Today: 2.4 Current dose: Coumadin 1.25 mg daily    Assessment:    Therapeutic INR for goal of 2-3   Plan:    1. New dose: Coumadin 0.5 mg PO Q M-W-F   2. Next INR: 02/02/16

## 2016-01-31 ENCOUNTER — Ambulatory Visit: Payer: Commercial Managed Care - HMO | Admitting: Internal Medicine

## 2016-02-01 NOTE — Telephone Encounter (Signed)
Pt is on Coumadin and it is followed by her nursing home, added to med list

## 2016-02-02 ENCOUNTER — Encounter: Payer: Self-pay | Admitting: Adult Health

## 2016-02-02 ENCOUNTER — Ambulatory Visit (INDEPENDENT_AMBULATORY_CARE_PROVIDER_SITE_OTHER)
Admission: RE | Admit: 2016-02-02 | Discharge: 2016-02-02 | Disposition: A | Discharge status: Home / Self Care | Payer: Commercial Managed Care - HMO | Source: Ambulatory Visit | Attending: Adult Health | Admitting: Adult Health

## 2016-02-02 ENCOUNTER — Ambulatory Visit (INDEPENDENT_AMBULATORY_CARE_PROVIDER_SITE_OTHER): Payer: Commercial Managed Care - HMO | Admitting: Adult Health

## 2016-02-02 VITALS — BP 108/60 | HR 66 | Ht 62.0 in | Wt 116.0 lb

## 2016-02-02 DIAGNOSIS — T462X5A Adverse effect of other antidysrhythmic drugs, initial encounter: Secondary | ICD-10-CM | POA: Diagnosis not present

## 2016-02-02 DIAGNOSIS — J984 Other disorders of lung: Secondary | ICD-10-CM

## 2016-02-02 DIAGNOSIS — J90 Pleural effusion, not elsewhere classified: Secondary | ICD-10-CM

## 2016-02-02 DIAGNOSIS — R0602 Shortness of breath: Secondary | ICD-10-CM | POA: Diagnosis not present

## 2016-02-02 NOTE — Patient Instructions (Signed)
Continue on Oxygen 2l/m .  Follow up with Dr. Lamonte Sakai in 6-8 weeks and As needed

## 2016-02-03 ENCOUNTER — Non-Acute Institutional Stay: Payer: Commercial Managed Care - HMO | Admitting: Adult Health

## 2016-02-03 ENCOUNTER — Encounter: Payer: Self-pay | Admitting: Adult Health

## 2016-02-03 DIAGNOSIS — F419 Anxiety disorder, unspecified: Secondary | ICD-10-CM | POA: Diagnosis not present

## 2016-02-03 DIAGNOSIS — E118 Type 2 diabetes mellitus with unspecified complications: Secondary | ICD-10-CM

## 2016-02-03 DIAGNOSIS — E785 Hyperlipidemia, unspecified: Secondary | ICD-10-CM

## 2016-02-03 DIAGNOSIS — R5381 Other malaise: Secondary | ICD-10-CM

## 2016-02-03 DIAGNOSIS — L97529 Non-pressure chronic ulcer of other part of left foot with unspecified severity: Secondary | ICD-10-CM

## 2016-02-03 DIAGNOSIS — I2510 Atherosclerotic heart disease of native coronary artery without angina pectoris: Secondary | ICD-10-CM

## 2016-02-03 DIAGNOSIS — J9601 Acute respiratory failure with hypoxia: Secondary | ICD-10-CM

## 2016-02-03 DIAGNOSIS — D638 Anemia in other chronic diseases classified elsewhere: Secondary | ICD-10-CM

## 2016-02-03 DIAGNOSIS — I5022 Chronic systolic (congestive) heart failure: Secondary | ICD-10-CM

## 2016-02-03 DIAGNOSIS — I2583 Coronary atherosclerosis due to lipid rich plaque: Secondary | ICD-10-CM

## 2016-02-03 DIAGNOSIS — K5901 Slow transit constipation: Secondary | ICD-10-CM | POA: Diagnosis not present

## 2016-02-03 DIAGNOSIS — I4892 Unspecified atrial flutter: Secondary | ICD-10-CM

## 2016-02-03 NOTE — Progress Notes (Signed)
Patient ID: Shannon [name], female   DOB: 06-16-1932, 82 y.o.   MRN: [id number]    DATE:  02/03/2016   MRN:  [id number]  BIRTHDAY: [birth year]  Facility:  [facility] Home Location:  [place] Room Number: [id number]  LEVEL OF CARE:  SNF (31)  Contact Information    Name Relation Home Work Dobbins Daughter   [phone]   Lavonne Chick   [phone]   [place] [phone]         Code Status History    Date Active Date Inactive Code Status Order ID Comments User Context   12/31/2015  4:28 AM 01/16/2016  8:28 PM Full Code [id number]  Rise Patience, MD Inpatient   10/09/2015  8:00 AM 10/14/2015  7:53 PM Full Code [id number]  Samella Parr, NP ED   10/09/2012  3:19 PM 10/09/2012 11:55 PM Full Code [id number]  Thompson Grayer, MD Inpatient       Chief Complaint  Patient presents with  . Discharge Note    HISTORY OF PRESENT ILLNESS:  This is an 82 year old female who is for discharge home with Home health PT, OT, CNA and Nursing. DME:  Standard wheelchair, elevating leg rests, anti-tippers, cushion, removable arm rests and O2 @ 2L/min via [place] continuously portable (gas) and concentrator.   She has been admitted to [facility] on 01/16/16 from [facility] wherein she was treated for acute respiratory failure secondary to acute systolic CHFCXR showed slight increase in interstitial edema. CT showed bronchiectasis and patchy densities. There is moderate to large sized right pleural effusion and moderate sized left pleural effusion. Echocardiogram showed EF of 20-25%. Pulmonology/Cardiology/EP was consulted. Amiodarone was discontinued. LE doppler negative for DVT or SVT. Metoprolol was held due to low BP. She was initially placed on IV Lasix then transitioned to po. She will follow-up in heart failure clinic outpatient.  Patient was admitted to this facility for short-term rehabilitation after the patient's recent hospitalization.   Patient has completed SNF rehabilitation and therapy has cleared the patient for discharge.   PAST MEDICAL HISTORY:  Past Medical History:  Diagnosis Date  . Arthritis   . Baker's cyst of knee   . CAD (coronary artery disease)    a. 05/2006 Cath/PCI: LAD 26m, D1 40 ost, LCX nl, OM1 95 (3.0x20 Taxus DES), RCA nl, EF 40% w/ lat AK;  04/2007 Ex MV: minimal lat ischemia in area of prior infarct->low risk-> med Rx.  . Chronic atrial fibrillation (HCC)    a. on Coumadin  . Chronic systolic (congestive) heart failure ([place])    a. 04/2015: EF 50-55% b. echo 12/2015: EF 20-25% w/ diffuse HK, biatrial enlargement, mild AI and MR, severe TR    . Diabetes mellitus   . Hyperlipidemia   . MVP (mitral valve prolapse)    a. 10/2003 Echo: nl LV fxn, mild MR/TR/AS  . Myocardial infarction ([place])   . Sleep apnea   . Transient ischemic attack      CURRENT MEDICATIONS: Reviewed  Patient's Medications  New Prescriptions   No medications on file  Previous Medications   ACCU-CHEK SMARTVIEW TEST STRIP       ACETAMINOPHEN (TYLENOL) 325 MG TABLET    Take 2 tablets (650 mg total) by mouth every 4 (four) hours as needed for headache or mild pain.   ALPRAZOLAM (XANAX) 0.25 MG TABLET    Take 1 tablet (0.25 mg total) by mouth daily as needed for anxiety.  ATORVASTATIN (LIPITOR) 20 MG TABLET    TAKE ONE TABLET BY MOUTH ONE TIME DAILY   BENZONATATE (TESSALON) 200 MG CAPSULE    Take 1 capsule (200 mg total) by mouth 3 (three) times daily as needed for cough.   CHOLECALCIFEROL (VITAMIN D PO)    Take 1 tablet by mouth daily.   CYANOCOBALAMIN (VITAMIN B-12 PO)    Take 1 tablet by mouth daily.   DIGOXIN 62.5 MCG TABS    Take 0.0625 mg by mouth every other day. Start on 01/18/16.   FERROUS SULFATE 325 (65 FE) MG TABLET    Take 325 mg by mouth daily with breakfast.   FUROSEMIDE (LASIX) 40 MG TABLET    Take 1 tablet (40 mg total) by mouth daily.   HYDROCODONE-ACETAMINOPHEN (NORCO/VICODIN) 5-325 MG TABLET    Take 0.5-1  tablets by mouth every 4 (four) hours as needed for severe pain.   HYDROCORTISONE (PROCTO-PAK) 1 % CREA    Apply 1 application topically daily.   LOSARTAN (COZAAR) 25 MG TABLET    Take 12.5 mg by mouth daily. Take 1/2 of a 25 mg tablet QAM, hold for SBP <100   METFORMIN (GLUCOPHAGE) 500 MG TABLET    Take 500 mg by mouth 2 (two) times daily.    MULTIPLE VITAMINS-MINERALS (DECUBI-VITE) CAPS    Take 1 capsule by mouth daily.   MUPIROCIN OINTMENT (BACTROBAN) 2 %    Place into the nose 2 (two) times daily.   NITROGLYCERIN (NITROSTAT) 0.4 MG SL TABLET    Place 0.4 mg under the tongue every 5 (five) minutes x 3 doses as needed for chest pain. Up to 3 doses, if pain continues, call 911.   ONDANSETRON (ZOFRAN) 4 MG TABLET    Take 4 mg by mouth every 6 (six) hours as needed for nausea or vomiting.    POLYETHYLENE GLYCOL (MIRALAX / GLYCOLAX) PACKET    Take 17 g by mouth daily.   PYRIDOXINE HCL (VITAMIN B-6 PO)    Take 1 tablet by mouth daily.   RANOLAZINE (RANEXA) 500 MG 12 HR TABLET    Take 1 tablet (500 mg total) by mouth 2 (two) times daily.   SACCHAROMYCES BOULARDII (FLORASTOR) 250 MG CAPSULE    Take 1 capsule (250 mg total) by mouth 2 (two) times daily.   SENNA-DOCUSATE (SENNA-S) 8.6-50 MG TABLET    Take 2 tablets by mouth daily.   SPIRONOLACTONE (ALDACTONE) 25 MG TABLET    Take 0.5 tablets (12.5 mg total) by mouth daily.   UNABLE TO FIND    Med Name: SF MedPass 90 mL TID for nutritional support   WARFARIN (COUMADIN) 1 MG TABLET    Take 1 mg by mouth daily. Take 1/2 tablet qd  Modified Medications   No medications on file  Discontinued Medications   LOSARTAN (COZAAR) 25 MG TABLET    Take 0.5 tablets (12.5 mg total) by mouth 2 (two) times daily.   SANTYL OINTMENT    Apply 1 application topically daily.    SENNA (SENOKOT) 8.6 MG TABLET    Take 2 tablets by mouth daily.   WARFARIN (COUMADIN) 2.5 MG TABLET    Take 1.25 mg by mouth daily.     Allergies  Allergen Reactions  . Demerol Other (See  Comments)    Burning sensation all over body  . Erythromycin Nausea And Vomiting  . Penicillins Other (See Comments)    unknown  . Percocet [Oxycodone-Acetaminophen] Swelling     REVIEW OF SYSTEMS:  GENERAL: no change in appetite, no fatigue, no weight changes, no fever, chills or weakness EYES: Denies change in vision, dry eyes, eye pain, itching or discharge EARS: Denies change in hearing, ringing in ears, or earache NOSE: Denies nasal congestion or epistaxis MOUTH and THROAT: Denies oral discomfort, gingival pain or bleeding, pain from teeth or hoarseness   RESPIRATORY: no cough, SOB, DOE, wheezing, hemoptysis CARDIAC: no chest pain, or palpitations GI: no abdominal pain, diarrhea, constipation, heart burn, nausea or vomiting GU: Denies dysuria, frequency, hematuria, incontinence, or discharge PSYCHIATRIC: Denies feeling of depression or anxiety. No report of hallucinations, insomnia, paranoia, or agitation    PHYSICAL EXAMINATION  GENERAL APPEARANCE: Well nourished. In no acute distress. Normal body habitus SKIN:  Left great toe wound with dressing HEAD: Normal in size and contour. No evidence of trauma EYES: Lids open and close normally. No blepharitis, entropion or ectropion. PERRL. Conjunctivae are clear and sclerae are white. Lenses are without opacity EARS: Pinnae are normal. Patient hears normal voice tunes of the examiner MOUTH and THROAT: Lips are without lesions. Oral mucosa is moist and without lesions. Tongue is normal in shape, size, and color and without lesions NECK: supple, trachea midline, no neck masses, no thyroid tenderness, no thyromegaly LYMPHATICS: no LAN in the neck, no supraclavicular LAN RESPIRATORY: breathing is even & unlabored, BS CTAB CARDIAC: RRR, + murmur,no extra heart sounds, BLE trace edema GI: abdomen soft, normal BS, no masses, no tenderness, no hepatomegaly, no splenomegaly EXTREMITIES:  Able to move X 4 extremities PSYCHIATRIC: Alert and  oriented X 3. Affect and behavior are appropriate  LABS/RADIOLOGY: Labs reviewed: Basic Metabolic Panel:  Recent Labs  01/02/16 0306  01/04/16 0325  01/15/16 0614 01/16/16 0455 01/25/16 1142  NA 136  < > 139  < > 134* 131* 134*  K 3.3*  < > 4.2  < > 4.7 4.6 4.4  CL 102  < > 101  < > 91* 90* 100*  CO2 27  < > 31  < > 37* 35* 28  GLUCOSE 123*  < > 169*  < > 84 89 95  BUN 18  < > 17  < > 16 16 10   CREATININE 0.93  < > 0.86  < > 0.86 0.87 0.76  CALCIUM 8.1*  < > 8.1*  < > 9.1 8.9 9.4  MG 1.3*  --  1.7  --   --   --   --   < > = values in this interval not displayed. Liver Function Tests:  Recent Labs  12/27/15 1708 12/30/15 2327 12/31/15 0525  AST 30 39 34  ALT 27 28 27   ALKPHOS 211* 202* 197*  BILITOT 1.1 1.3* 1.2  PROT 6.0* 5.9* 6.0*  ALBUMIN 3.2* 2.7* 2.5*    Recent Labs  12/30/15 2327 12/31/15 0525  LIPASE 41 38   CBC:  Recent Labs  12/01/15 1502 12/27/15 1708  12/31/15 0525  01/14/16 0250 01/15/16 0614 01/16/16 0455  WBC 7.2 7.9  < > 8.1  < > 6.8 6.5 7.0  NEUTROABS 5,832 6,794  --  6.3  --   --   --   --   HGB 10.5* 12.0  < > 11.7*  < > 10.7* 10.4* 10.6*  HCT 33.1* 36.8  < > 37.0  < > 33.7* 32.9* 34.1*  MCV 101.5* 101.9*  < > 103.9*  < > 102.7* 101.9* 102.7*  PLT 267 259  < > 263  < > 183 199 198  < > =  values in this interval not displayed.  Lipid Panel:  Recent Labs  07/22/15 0845  HDL 64   Cardiac Enzymes:  Recent Labs  12/31/15 0525 12/31/15 1022 12/31/15 1614  TROPONINI 0.07* 0.06* 0.05*   CBG:  Recent Labs  01/16/16 0802 01/16/16 1143 01/16/16 1632  GLUCAP 82 133* 159*      Dg Chest 2 View  Result Date: 02/02/2016 CLINICAL DATA:  Shortness of breath for the past 2 months. Recent hospitalization and bilateral pleural effusions. EXAM: CHEST  2 VIEW COMPARISON:  01/11/2016. FINDINGS: Mildly enlarged cardiac silhouette with an interval decrease in size. Small bilateral pleural effusions, also significantly decreased in  size. The lungs are hyperexpanded with diffusely prominent interstitial markings. This is somewhat patchy with clearing of the previously demonstrated bilateral airspace opacities. Diffuse osteopenia. Thoracolumbar spine degenerative changes. Aortic calcifications. IMPRESSION: 1. Significantly improved changes of congestive heart failure with resolved alveolar edema, improved cardiomegaly and a significant decrease in size of bilateral pleural effusions. 2. COPD. Electronically Signed   By: Claudie Revering M.D.   On: 02/02/2016 16:07   Dg Chest 2 View  Result Date: 01/07/2016 CLINICAL DATA:  Short of breath, congestive heart failure. EXAM: CHEST  2 VIEW COMPARISON:  01/06/2016 FINDINGS: Mildly enlarged cardiac silhouette. Interval increase in perihilar airspace disease. This increases only mild. Bilateral pleural effusions unchanged. IMPRESSION: No significant change in congestive heart failure pattern. Mild increase in pulmonary edema. Electronically Signed   By: Suzy Bouchard M.D.   On: 01/07/2016 12:28   Dg Chest 2 View  Result Date: 01/05/2016 CLINICAL DATA:  Preoperative evaluation prior to cardiac catheterization which is scheduled for tomorrow. Shortness of breath and generalized weakness. EXAM: CHEST  2 VIEW COMPARISON:  12/31/2015, 11/16/2015 and earlier. FINDINGS: Cardiac silhouette mildly to moderately enlarged, unchanged. Thoracic aorta tortuous and atherosclerotic, unchanged. Hilar and mediastinal contours otherwise unremarkable. Mild interstitial and perihilar airspace pulmonary edema, slightly worsened since the examination 5 days ago. Moderate-sized bilateral pleural effusions, unchanged, with associated dense consolidation in the lower lobes. Abdominal aortic atherosclerosis without aneurysm. Osseous demineralization, degenerative changes in both shoulders, thoracic dextroscoliosis with compensatory thoracolumbar levoscoliosis. IMPRESSION: 1. Mild CHF with stable mild to moderate cardiomegaly  and mild interstitial and perihilar airspace pulmonary edema. This has worsened slightly since the examination 5 days ago. 2. Stable moderate-sized bilateral pleural effusions and associated dense passive atelectasis in the lower lobes. 3. Thoracic and abdominal aortic atherosclerosis. Electronically Signed   By: Evangeline Dakin M.D.   On: 01/05/2016 14:18   Ct Chest Wo Contrast  Result Date: 01/09/2016 CLINICAL DATA:  Chest pain for the past few days. Shortness of breath. EXAM: CT CHEST WITHOUT CONTRAST TECHNIQUE: Multidetector CT imaging of the chest was performed following the standard protocol without IV contrast. COMPARISON:  Portable chest obtained yesterday. FINDINGS: Mediastinum/Lymph Nodes: Dense coronary artery calcifications. Mildly enlarged heart. Mildly prominent mediastinal lymph nodes without pathological enlargement. Small pericardial effusion with a maximum thickness of 10 mm. Lungs/Pleura: Moderate to large-sized right pleural effusion and moderate sized left pleural effusion. Bilateral lower lobe compressive atelectasis. Bilateral cylindrical bronchiectasis and patchy densities. No lung nodules. Upper abdomen: Atheromatous aortic calcifications. Musculoskeletal: Thoracic spine degenerative changes. Sternomanubrial and bilateral sternoclavicular degenerative changes. Mild scoliosis. IMPRESSION: 1. Cylindrical bronchiectasis and patchy densities throughout the majority of both lungs, most pronounced in the upper lobes. The patchy densities could represent chronic changes associated with the bronchiectasis or areas of active inflammation or infection. 2. Moderate to large-sized right pleural effusion and moderate-sized left  pleural effusion. 3. Bilateral compressive atelectasis. 4. Dense coronary artery atheromatous calcifications. 5. Aortic atherosclerosis. Electronically Signed   By: Claudie Revering M.D.   On: 01/09/2016 16:31   Dg Chest Port 1 View  Result Date: 01/11/2016 CLINICAL DATA:   Acute respiratory failure, acute and chronic CHF, atrial fibrillation EXAM: PORTABLE CHEST 1 VIEW COMPARISON:  Portable chest x-ray of January 09, 2016 FINDINGS: The lungs oral adequately inflated. Confluent interstitial and alveolar opacities persist bilaterally. There are bilateral pleural effusions greater on the right than on the left. The cardiac silhouette is mildly enlarged. There is tortuosity of the ascending and descending thoracic aorta. There is calcification in the wall of the aortic arch. The PICC line tip projects over the mid to distal SVC. IMPRESSION: Persistent confluent interstitial and alveolar opacities consistent with pneumonia with coexisting pulmonary edema. Stable bilateral pleural effusions greater on the right than on the left. Aortic atherosclerosis. Electronically Signed   By: David  Martinique M.D.   On: 01/11/2016 07:21   Dg Chest Port 1 View  Result Date: 01/09/2016 CLINICAL DATA:  Status post PICC placement today. EXAM: PORTABLE CHEST 1 VIEW COMPARISON:  CT chest today.  Single view of the chest 01/08/2016. FINDINGS: Right PICC is in place with the tip in the lower superior vena cava. Bilateral pleural effusions and extensive airspace disease persist without change. No pneumothorax. Heart size is enlarged. IMPRESSION: Tip of right PICC projects the lower superior vena cava. No change in bilateral effusions and airspace disease. Electronically Signed   By: Inge Rise M.D.   On: 01/09/2016 18:18   Dg Chest Port 1 View  Result Date: 01/08/2016 CLINICAL DATA:  Acute onset of dyspnea.  Initial encounter. EXAM: PORTABLE CHEST 1 VIEW COMPARISON:  Chest radiograph performed 01/07/2016 FINDINGS: Small bilateral pleural effusions are noted. Patchy bilateral airspace opacities may reflect pulmonary edema or possibly pneumonia. No pneumothorax is seen. The cardiomediastinal silhouette is mildly enlarged. No acute osseous abnormalities are identified. IMPRESSION: Small bilateral pleural  effusions noted. Patchy bilateral airspace opacities may reflect pulmonary edema or possibly pneumonia. Mild cardiomegaly. Electronically Signed   By: Garald Balding M.D.   On: 01/08/2016 19:27   Dg Chest Port 1 View  Result Date: 01/06/2016 CLINICAL DATA:  Acute onset of shortness of breath and cough. Preoperative chest radiograph. Initial encounter. EXAM: PORTABLE CHEST 1 VIEW COMPARISON:  Chest radiograph performed 01/05/2016 FINDINGS: Small bilateral pleural effusions are noted. Mildly increased bilateral airspace opacification may reflect pulmonary edema or pneumonia. No pneumothorax is seen. The cardiomediastinal silhouette is mildly enlarged. No acute osseous abnormalities are identified. IMPRESSION: Small bilateral pleural effusions noted. Mildly increased bilateral airspace opacification may reflect pulmonary edema or pneumonia. Mild cardiomegaly. Electronically Signed   By: Garald Balding M.D.   On: 01/06/2016 06:48    ASSESSMENT/PLAN:  Physical deconditioning - for Home health PT, OT, CNA and Nursing  Acute respiratory failure with hypoxia secondary to acute  Systolic CHF - continue O2 @ 2L/min via [place] continuously  Chronic systolic CHF - EF 0000000; continue Spironolactone 25 mg 1/2 tab= 12.5 mg daily, Digoxin 0.0625 mg Q other day  and Lasix 40 mg daily; follow-up BMP in 1 week with PCP; follow-up with Heart failure Clinic  CAD - underwent cardiac cath on 7/07 showed widely patent stents; continue NTG PRN,  And Lipitor 20 mg daily  Atrial fibrillation - rate-controlled;  Continue Digoxin, Ranexa and Coumadin  Left great toe wound - continue wound treatment and monitor for signs and symptoms  of infection  Diabetes Mellitus, type 2 - continue Metformin 500 mg 1 tab by mouth twice a day Lab Results  Component Value Date   HGBA1C 6.8 (H) 10/09/2015   Hyperlipidemia - continue Lipitor 20 mg 1 tab by mouth daily Lab Results  Component Value Date   CHOL 103 (L) 07/22/2015   HDL 64  07/22/2015   LDLCALC 28 07/22/2015   TRIG 57 07/22/2015   CHOLHDL 1.6 07/22/2015   Anxiety - mood is stable; continue Xanax 0.25 mg 1 tab by mouth daily when necessary   Constipation - continue senna S  8.6-50 mg take 2 tabs by mouth daily and MiraLAX 17 g by mouth daily  Anemia of chronic disease - continue ferrous sulfate 325 mg 1 tab by mouth daily Lab Results  Component Value Date   HGB 10.6 (L) 01/16/2016         I have filled out patient's discharge paperwork and written prescriptions.  Patient will receive home health PT, OT, Nursing and CNA.  DME provided:  Standard wheelchair, elevating leg rests, anti-tippers, cushion, removable arm rests and O2 @ 2L/min via [place] continuously portable (gas) and concentrator.     Total discharge time: Greater than 30 minutes  Discharge time involved coordination of the discharge process with social worker, nursing staff and therapy department. Medical justification for home health services/DME verified.     Durenda Age, NP [employer] [phone]

## 2016-02-03 NOTE — Progress Notes (Signed)
Subjective:    Patient ID: Shannon [name], female    DOB: December 03, 1931, 82 y.o.   MRN: [id number]  HPI 82 year old female in for pulmonary consult for pulmonary infiltrates during hospitalization last month for acute on chronic congestive heart failure.[name] He was found to have an EF of 20-25%.   02/02/16 [facility] follow up  Patient returns for a one-month follow-up from recent hospitalization. Patient was seen for pulmonary consult last month during her hospitalization for decompensated congestive heart failure. Despite aggressive diuresis. Patient continued to have shortness of breath and diffuse pulmonary infiltrates. Was felt that he possibly secondary to amiodarone. This was stopped.  CT chest showed bronchiectasis and patchy densities through the majority of both lungs pronounced in the upper lobes. Moderate to large size right pleural effusion and moderate sized left pleural effusion. Bilateral compressive atelectasis. Cardiac catheterization showed stable coronary artery disease. She was recommended to maximize medical therapy.  2-D echo showed worsening EF at 20-25%, diffuse hypokinesis, severe tricuspid regurgitation with severely elevated pulmonary pressures.. Lower extremity Dopplers were negative for DVT. Did have atrial filb that required cardioversion. He was discharged on oxygen. Since discharge she is feeling improved with decreased shortness of breath, but remains very weak. She denies any hemoptysis, chest pain, increased lower extremity swelling.. Chest x-ray today shows significantly improved changes of congestive heart failure with decreased edema.  Past Medical History:  Diagnosis Date  . Arthritis   . Baker's cyst of knee   . CAD (coronary artery disease)    a. 05/2006 Cath/PCI: LAD 34m, D1 40 ost, LCX nl, OM1 95 (3.0x20 Taxus DES), RCA nl, EF 40% w/ lat AK;  04/2007 Ex MV: minimal lat ischemia in area of prior infarct->low risk-> med Rx.  . Chronic atrial fibrillation  (HCC)    a. on Coumadin  . Chronic systolic (congestive) heart failure ([place])    a. 04/2015: EF 50-55% b. echo 12/2015: EF 20-25% w/ diffuse HK, biatrial enlargement, mild AI and MR, severe TR    . Diabetes mellitus   . Hyperlipidemia   . MVP (mitral valve prolapse)    a. 10/2003 Echo: nl LV fxn, mild MR/TR/AS  . Myocardial infarction ([place])   . Sleep apnea   . Transient ischemic attack    . Current Outpatient Prescriptions on File Prior to Visit  Medication Sig Dispense Refill  . ACCU-CHEK SMARTVIEW test strip     . acetaminophen (TYLENOL) 325 MG tablet Take 2 tablets (650 mg total) by mouth every 4 (four) hours as needed for headache or mild pain.    [name] ALPRAZolam (XANAX) 0.25 MG tablet Take 1 tablet (0.25 mg total) by mouth daily as needed for anxiety. 30 tablet 0  . atorvastatin (LIPITOR) 20 MG tablet TAKE ONE TABLET BY MOUTH ONE TIME DAILY 90 tablet 3  . benzonatate (TESSALON) 200 MG capsule Take 1 capsule (200 mg total) by mouth 3 (three) times daily as needed for cough. 20 capsule 0  . Cholecalciferol (VITAMIN D PO) Take 1 tablet by mouth daily.    . Cyanocobalamin (VITAMIN B-12 PO) Take 1 tablet by mouth daily.    . digoxin 62.5 MCG TABS Take 0.0625 mg by mouth every other day. Start on 01/18/16. 15 tablet 0  . ferrous sulfate 325 (65 FE) MG tablet Take 325 mg by mouth daily with breakfast.    . furosemide (LASIX) 40 MG tablet Take 1 tablet (40 mg total) by mouth daily. 30 tablet 0  . HYDROcodone-acetaminophen (NORCO/VICODIN) 5-325  MG tablet Take 0.5-1 tablets by mouth every 4 (four) hours as needed for severe pain. 20 tablet 0  . hydrocortisone (PROCTO-PAK) 1 % CREA Apply 1 application topically daily. 1 Tube 0  . metFORMIN (GLUCOPHAGE) 500 MG tablet Take 500 mg by mouth 2 (two) times daily.     . mupirocin ointment (BACTROBAN) 2 % Place into the nose 2 (two) times daily. 22 g 0  . nitroGLYCERIN (NITROSTAT) 0.4 MG SL tablet Place 0.4 mg under the tongue every 5 (five) minutes x 3  doses as needed for chest pain. Up to 3 doses, if pain continues, call 911.    . ondansetron (ZOFRAN) 4 MG tablet Take 4 mg by mouth every 6 (six) hours as needed for nausea or vomiting.     . Pyridoxine HCl (VITAMIN B-6 PO) Take 1 tablet by mouth daily.    . ranolazine (RANEXA) 500 MG 12 hr tablet Take 1 tablet (500 mg total) by mouth 2 (two) times daily. 60 tablet 0  . saccharomyces boulardii (FLORASTOR) 250 MG capsule Take 1 capsule (250 mg total) by mouth 2 (two) times daily. 30 capsule 0  . spironolactone (ALDACTONE) 25 MG tablet Take 0.5 tablets (12.5 mg total) by mouth daily. 30 tablet 0   No current facility-administered medications on file prior to visit.      Review of Systems    Constitutional:   No  weight loss, night sweats,  Fevers, chills, + fatigue, or  lassitude.  HEENT:   No headaches,  Difficulty swallowing,  Tooth/dental problems, or  Sore throat,                No sneezing, itching, ear ache, nasal congestion, post nasal drip,   CV:  No chest pain,  Orthopnea, PND, swelling in lower extremities, anasarca, dizziness, palpitations, syncope.   GI  No heartburn, indigestion, abdominal pain, nausea, vomiting, diarrhea, change in bowel habits, loss of appetite, bloody stools.   Resp:   No excess mucus, no productive cough,  No non-productive cough,  No coughing up of blood.  No change in color of mucus.  No wheezing.  No chest wall deformity  Skin: no rash or lesions.  GU: no dysuria, change in color of urine, no urgency or frequency.  No flank pain, no hematuria   MS:  No joint pain or swelling.  No decreased range of motion.  No back pain.  Psych:  No change in mood or affect. No depression or anxiety.  No memory loss.      Objective:   Physical Exam Vitals:   02/02/16 1628  BP: 108/60  Pulse: 66  SpO2: 100%  Weight: 116 lb (52.6 kg)  Height: 5\' 2"  (1.575 m)   GEN: A/Ox3; pleasant , NAD, elderly    HEENT:  [place]/AT,  EACs-clear, TMs-wnl, NOSE-clear,  THROAT-clear, no lesions, no postnasal drip or exudate noted.   NECK:  Supple w/ fair ROM; no JVD; normal carotid impulses w/o bruits; no thyromegaly or nodules palpated; no lymphadenopathy.    RESP  Decreased BS in bases ,  no accessory muscle use, no dullness to percussion  CARD:  RRR, no m/r/g  , no peripheral edema, pulses intact, no cyanosis or clubbing.  GI:   Soft & nt; nml bowel sounds; no organomegaly or masses detected.   Musco: Warm bil, no deformities or joint swelling noted.   Neuro: alert, no focal deficits noted.    Skin: Warm, no lesions or rashes  Geovonni Meyerhoff NP-C  [facility]  02/02/16        Assessment & Plan:

## 2016-02-06 ENCOUNTER — Telehealth (HOSPITAL_COMMUNITY): Payer: Self-pay | Admitting: *Deleted

## 2016-02-06 NOTE — Telephone Encounter (Signed)
Called pt to f/u with her since she was d/c'd from [facility] over the weekend.  She states she is currently staying at her son's home and was supposed to be set up with [facility] however they have not contacted her yet.  Will f/u w/[facility] to make sure they will be seeing her asap and monitoring her INR's, message also sent to [facility] to f/u on INR readings.  Pt states she is feeling ok and her son and daughter seem to have all of her medications under control, she will call us back if she has any further questions or issues or if she does not hear from [facility] in the next day or 2.

## 2016-02-07 ENCOUNTER — Telehealth: Payer: Self-pay | Admitting: *Deleted

## 2016-02-07 DIAGNOSIS — T462X5A Adverse effect of other antidysrhythmic drugs, initial encounter: Secondary | ICD-10-CM

## 2016-02-07 DIAGNOSIS — J984 Other disorders of lung: Secondary | ICD-10-CM | POA: Insufficient documentation

## 2016-02-07 NOTE — Assessment & Plan Note (Signed)
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 Much improved after Rate control and diuresis .  CXR today is  o2 demands are less now.  Would continue on oxygen for now. Repeat cxr in 6 weeks and plan for PFT in future if she is able.

## 2016-02-07 NOTE — Telephone Encounter (Signed)
[facility] nurse I gave order to check INR tomorrow states they will not be able to check INR tomorrow due to staffing. She states they received referral around 4pm  And can not fit pt into schedule until Thursday for INR. Explain the importance and that we would prefer INR tomorrow due to pt age and recent hosp discharge but will just notate pt will have visit on Thursday.

## 2016-02-08 DIAGNOSIS — I5021 Acute systolic (congestive) heart failure: Secondary | ICD-10-CM | POA: Diagnosis not present

## 2016-02-08 DIAGNOSIS — R269 Unspecified abnormalities of gait and mobility: Secondary | ICD-10-CM | POA: Diagnosis not present

## 2016-02-08 DIAGNOSIS — M6281 Muscle weakness (generalized): Secondary | ICD-10-CM | POA: Diagnosis not present

## 2016-02-08 DIAGNOSIS — R0602 Shortness of breath: Secondary | ICD-10-CM | POA: Diagnosis not present

## 2016-02-08 DIAGNOSIS — R1312 Dysphagia, oropharyngeal phase: Secondary | ICD-10-CM | POA: Diagnosis not present

## 2016-02-08 DIAGNOSIS — I2510 Atherosclerotic heart disease of native coronary artery without angina pectoris: Secondary | ICD-10-CM | POA: Diagnosis not present

## 2016-02-08 DIAGNOSIS — J9601 Acute respiratory failure with hypoxia: Secondary | ICD-10-CM | POA: Diagnosis not present

## 2016-02-08 DIAGNOSIS — Z5189 Encounter for other specified aftercare: Secondary | ICD-10-CM | POA: Diagnosis not present

## 2016-02-09 DIAGNOSIS — R296 Repeated falls: Secondary | ICD-10-CM | POA: Diagnosis not present

## 2016-02-09 DIAGNOSIS — I252 Old myocardial infarction: Secondary | ICD-10-CM | POA: Diagnosis not present

## 2016-02-09 DIAGNOSIS — E119 Type 2 diabetes mellitus without complications: Secondary | ICD-10-CM | POA: Diagnosis not present

## 2016-02-09 DIAGNOSIS — R531 Weakness: Secondary | ICD-10-CM | POA: Diagnosis not present

## 2016-02-09 DIAGNOSIS — I2510 Atherosclerotic heart disease of native coronary artery without angina pectoris: Secondary | ICD-10-CM | POA: Diagnosis not present

## 2016-02-09 DIAGNOSIS — J449 Chronic obstructive pulmonary disease, unspecified: Secondary | ICD-10-CM | POA: Diagnosis not present

## 2016-02-09 DIAGNOSIS — G4730 Sleep apnea, unspecified: Secondary | ICD-10-CM | POA: Diagnosis not present

## 2016-02-09 DIAGNOSIS — I5023 Acute on chronic systolic (congestive) heart failure: Secondary | ICD-10-CM | POA: Diagnosis not present

## 2016-02-09 DIAGNOSIS — G4739 Other sleep apnea: Secondary | ICD-10-CM | POA: Diagnosis not present

## 2016-02-09 DIAGNOSIS — I482 Chronic atrial fibrillation: Secondary | ICD-10-CM | POA: Diagnosis not present

## 2016-02-09 LAB — POCT INR: INR: 1.4

## 2016-02-10 ENCOUNTER — Ambulatory Visit (INDEPENDENT_AMBULATORY_CARE_PROVIDER_SITE_OTHER): Payer: Commercial Managed Care - HMO | Admitting: Cardiology

## 2016-02-10 DIAGNOSIS — E119 Type 2 diabetes mellitus without complications: Secondary | ICD-10-CM | POA: Diagnosis not present

## 2016-02-10 DIAGNOSIS — I482 Chronic atrial fibrillation, unspecified: Secondary | ICD-10-CM

## 2016-02-10 DIAGNOSIS — I252 Old myocardial infarction: Secondary | ICD-10-CM | POA: Diagnosis not present

## 2016-02-10 DIAGNOSIS — I2510 Atherosclerotic heart disease of native coronary artery without angina pectoris: Secondary | ICD-10-CM | POA: Diagnosis not present

## 2016-02-10 DIAGNOSIS — R296 Repeated falls: Secondary | ICD-10-CM | POA: Diagnosis not present

## 2016-02-10 DIAGNOSIS — Z7901 Long term (current) use of anticoagulants: Secondary | ICD-10-CM

## 2016-02-10 DIAGNOSIS — G4730 Sleep apnea, unspecified: Secondary | ICD-10-CM | POA: Diagnosis not present

## 2016-02-10 DIAGNOSIS — R531 Weakness: Secondary | ICD-10-CM | POA: Diagnosis not present

## 2016-02-10 DIAGNOSIS — J449 Chronic obstructive pulmonary disease, unspecified: Secondary | ICD-10-CM | POA: Diagnosis not present

## 2016-02-10 DIAGNOSIS — I5023 Acute on chronic systolic (congestive) heart failure: Secondary | ICD-10-CM | POA: Diagnosis not present

## 2016-02-14 ENCOUNTER — Telehealth: Payer: Self-pay | Admitting: Cardiovascular Disease

## 2016-02-14 DIAGNOSIS — R531 Weakness: Secondary | ICD-10-CM | POA: Diagnosis not present

## 2016-02-14 DIAGNOSIS — I252 Old myocardial infarction: Secondary | ICD-10-CM | POA: Diagnosis not present

## 2016-02-14 DIAGNOSIS — G4730 Sleep apnea, unspecified: Secondary | ICD-10-CM | POA: Diagnosis not present

## 2016-02-14 DIAGNOSIS — E119 Type 2 diabetes mellitus without complications: Secondary | ICD-10-CM | POA: Diagnosis not present

## 2016-02-14 DIAGNOSIS — J449 Chronic obstructive pulmonary disease, unspecified: Secondary | ICD-10-CM | POA: Diagnosis not present

## 2016-02-14 DIAGNOSIS — I482 Chronic atrial fibrillation: Secondary | ICD-10-CM | POA: Diagnosis not present

## 2016-02-14 DIAGNOSIS — R296 Repeated falls: Secondary | ICD-10-CM | POA: Diagnosis not present

## 2016-02-14 DIAGNOSIS — I5023 Acute on chronic systolic (congestive) heart failure: Secondary | ICD-10-CM | POA: Diagnosis not present

## 2016-02-14 DIAGNOSIS — I2510 Atherosclerotic heart disease of native coronary artery without angina pectoris: Secondary | ICD-10-CM | POA: Diagnosis not present

## 2016-02-14 NOTE — Telephone Encounter (Signed)
Per  Sunday Spillers daughter in-law"s call:   Pt's BP today 90/50  10:50am heart rate floctuating 50 - 70 hr  Pt is very weak and naushated  Pt has lost 6lb in the pass week.  Her home healthcare of [facility] asked that they call the office.  Please give them a call back.

## 2016-02-14 NOTE — Telephone Encounter (Signed)
I spoke with pt's caregiver, Sunday Spillers, with pt's verbal permission.

## 2016-02-14 NOTE — Telephone Encounter (Signed)
Shannon [name] states pt had been instructions to hold lasix if SBP <110. Shannon [name] states SBP today was 90 so pt did not take lasix.  Shannon [name] states that pt has lost 6 pounds in 1 week, pt is weak, no appetite. Shannon [name] advised to encourage pt to stay adequately hydrated, important to keep follow up appt scheduled in HF Clinic tomorrow.

## 2016-02-15 ENCOUNTER — Encounter (HOSPITAL_BASED_OUTPATIENT_CLINIC_OR_DEPARTMENT_OTHER): Payer: Commercial Managed Care - HMO | Attending: Surgery

## 2016-02-15 ENCOUNTER — Ambulatory Visit (HOSPITAL_COMMUNITY)
Admission: RE | Admit: 2016-02-15 | Discharge: 2016-02-15 | Disposition: A | Discharge status: Home / Self Care | Payer: Commercial Managed Care - HMO | Source: Ambulatory Visit | Attending: Cardiology | Admitting: Cardiology

## 2016-02-15 VITALS — BP 100/58 | HR 60 | Wt 116.0 lb

## 2016-02-15 DIAGNOSIS — Z8249 Family history of ischemic heart disease and other diseases of the circulatory system: Secondary | ICD-10-CM | POA: Diagnosis not present

## 2016-02-15 DIAGNOSIS — I428 Other cardiomyopathies: Secondary | ICD-10-CM | POA: Diagnosis not present

## 2016-02-15 DIAGNOSIS — I5022 Chronic systolic (congestive) heart failure: Secondary | ICD-10-CM | POA: Diagnosis not present

## 2016-02-15 DIAGNOSIS — R6889 Other general symptoms and signs: Secondary | ICD-10-CM | POA: Diagnosis not present

## 2016-02-15 DIAGNOSIS — Z79899 Other long term (current) drug therapy: Secondary | ICD-10-CM | POA: Diagnosis not present

## 2016-02-15 DIAGNOSIS — Z7984 Long term (current) use of oral hypoglycemic drugs: Secondary | ICD-10-CM | POA: Insufficient documentation

## 2016-02-15 DIAGNOSIS — I2510 Atherosclerotic heart disease of native coronary artery without angina pectoris: Secondary | ICD-10-CM | POA: Insufficient documentation

## 2016-02-15 DIAGNOSIS — E1151 Type 2 diabetes mellitus with diabetic peripheral angiopathy without gangrene: Secondary | ICD-10-CM | POA: Diagnosis not present

## 2016-02-15 DIAGNOSIS — I509 Heart failure, unspecified: Secondary | ICD-10-CM | POA: Diagnosis not present

## 2016-02-15 DIAGNOSIS — Z87891 Personal history of nicotine dependence: Secondary | ICD-10-CM | POA: Insufficient documentation

## 2016-02-15 DIAGNOSIS — Z8673 Personal history of transient ischemic attack (TIA), and cerebral infarction without residual deficits: Secondary | ICD-10-CM | POA: Diagnosis not present

## 2016-02-15 DIAGNOSIS — I252 Old myocardial infarction: Secondary | ICD-10-CM | POA: Diagnosis not present

## 2016-02-15 DIAGNOSIS — Z7901 Long term (current) use of anticoagulants: Secondary | ICD-10-CM | POA: Insufficient documentation

## 2016-02-15 DIAGNOSIS — L97522 Non-pressure chronic ulcer of other part of left foot with fat layer exposed: Secondary | ICD-10-CM | POA: Insufficient documentation

## 2016-02-15 DIAGNOSIS — G4730 Sleep apnea, unspecified: Secondary | ICD-10-CM | POA: Diagnosis not present

## 2016-02-15 DIAGNOSIS — L97521 Non-pressure chronic ulcer of other part of left foot limited to breakdown of skin: Secondary | ICD-10-CM | POA: Diagnosis not present

## 2016-02-15 DIAGNOSIS — I482 Chronic atrial fibrillation, unspecified: Secondary | ICD-10-CM

## 2016-02-15 DIAGNOSIS — E785 Hyperlipidemia, unspecified: Secondary | ICD-10-CM | POA: Insufficient documentation

## 2016-02-15 DIAGNOSIS — I4891 Unspecified atrial fibrillation: Secondary | ICD-10-CM | POA: Diagnosis not present

## 2016-02-15 DIAGNOSIS — I480 Paroxysmal atrial fibrillation: Secondary | ICD-10-CM | POA: Diagnosis not present

## 2016-02-15 DIAGNOSIS — E11621 Type 2 diabetes mellitus with foot ulcer: Secondary | ICD-10-CM | POA: Diagnosis not present

## 2016-02-15 DIAGNOSIS — Z823 Family history of stroke: Secondary | ICD-10-CM | POA: Insufficient documentation

## 2016-02-15 DIAGNOSIS — Z955 Presence of coronary angioplasty implant and graft: Secondary | ICD-10-CM | POA: Diagnosis not present

## 2016-02-15 DIAGNOSIS — E119 Type 2 diabetes mellitus without complications: Secondary | ICD-10-CM | POA: Insufficient documentation

## 2016-02-15 DIAGNOSIS — I890 Lymphedema, not elsewhere classified: Secondary | ICD-10-CM | POA: Diagnosis not present

## 2016-02-15 LAB — BASIC METABOLIC PANEL
Anion gap: 10 (ref 5–15)
BUN: 25 mg/dL — ABNORMAL HIGH (ref 6–20)
CALCIUM: 9.2 mg/dL (ref 8.9–10.3)
CO2: 28 mmol/L (ref 22–32)
CREATININE: 1.55 mg/dL — AB (ref 0.44–1.00)
Chloride: 99 mmol/L — ABNORMAL LOW (ref 101–111)
GFR calc non Af Amer: 30 mL/min — ABNORMAL LOW (ref >60)
GFR, EST AFRICAN AMERICAN: 35 mL/min — AB (ref >60)
GLUCOSE: 123 mg/dL — AB (ref 65–99)
Potassium: 4.3 mmol/L (ref 3.5–5.1)
Sodium: 137 mmol/L (ref 135–145)

## 2016-02-15 LAB — CBC
HEMATOCRIT: 33.6 % — AB (ref 36.0–46.0)
Hemoglobin: 10.8 g/dL — ABNORMAL LOW (ref 12.0–15.0)
MCH: 32.4 pg (ref 26.0–34.0)
MCHC: 32.1 g/dL (ref 30.0–36.0)
MCV: 100.9 fL — AB (ref 78.0–100.0)
Platelets: 208 10*3/uL (ref 150–400)
RBC: 3.33 MIL/uL — ABNORMAL LOW (ref 3.87–5.11)
RDW: 16.7 % — AB (ref 11.5–15.5)
WBC: 7.7 10*3/uL (ref 4.0–10.5)

## 2016-02-15 LAB — DIGOXIN LEVEL: Digoxin Level: 0.7 ng/mL — ABNORMAL LOW (ref 0.8–2.0)

## 2016-02-15 LAB — BRAIN NATRIURETIC PEPTIDE: B Natriuretic Peptide: 369 pg/mL — ABNORMAL HIGH (ref 0.0–100.0)

## 2016-02-15 MED ORDER — FUROSEMIDE 40 MG PO TABS: 20.0000 mg | ORAL_TABLET | ORAL | 0 refills | Status: DC

## 2016-02-15 NOTE — Progress Notes (Signed)
CSW referred to assist with transportation needs. Patient came to clinic today through [facility] transport (cab ride) provided by CSW yesterday. Patient eligible for 5 additional round trip fares through [facility] although states that it was difficult today as she struggles with ambulating out of the house. Patient primarily is wheelchair bound with ability to take a few steps using a walker. She reports the cab driver was helpful today but "can't always count on that". CSW discussed SCAT although similar challenges but she would be able to transport in her w/c unlike the cab. Patient and daughter in law state that patient's son can help with transport but would only be available in the afternoons. CSW assisted with obtaining afternoon appointment for follow up as well as completed a SCAT application to assist when son not available. Patient and daughter in law Sunday Spillers grateful for assistance. CSW will continue to be available as needed. Raquel Sarna, LCSW [phone]

## 2016-02-15 NOTE — Patient Instructions (Addendum)
Stop Losartan  Take Spironolactone 12.5 mg (1/2 tab) daily  Take Furosemide 20 mg (1/2 tab) every other day   Labs today  Your physician recommends that you schedule a follow-up appointment in: 3 weeks

## 2016-02-16 ENCOUNTER — Ambulatory Visit (INDEPENDENT_AMBULATORY_CARE_PROVIDER_SITE_OTHER): Payer: Commercial Managed Care - HMO

## 2016-02-16 DIAGNOSIS — I482 Chronic atrial fibrillation, unspecified: Secondary | ICD-10-CM

## 2016-02-16 DIAGNOSIS — J449 Chronic obstructive pulmonary disease, unspecified: Secondary | ICD-10-CM | POA: Diagnosis not present

## 2016-02-16 DIAGNOSIS — I252 Old myocardial infarction: Secondary | ICD-10-CM | POA: Diagnosis not present

## 2016-02-16 DIAGNOSIS — I5023 Acute on chronic systolic (congestive) heart failure: Secondary | ICD-10-CM | POA: Diagnosis not present

## 2016-02-16 DIAGNOSIS — Z7901 Long term (current) use of anticoagulants: Secondary | ICD-10-CM

## 2016-02-16 DIAGNOSIS — G4730 Sleep apnea, unspecified: Secondary | ICD-10-CM | POA: Diagnosis not present

## 2016-02-16 DIAGNOSIS — I2510 Atherosclerotic heart disease of native coronary artery without angina pectoris: Secondary | ICD-10-CM | POA: Diagnosis not present

## 2016-02-16 DIAGNOSIS — R531 Weakness: Secondary | ICD-10-CM | POA: Diagnosis not present

## 2016-02-16 DIAGNOSIS — E119 Type 2 diabetes mellitus without complications: Secondary | ICD-10-CM | POA: Diagnosis not present

## 2016-02-16 DIAGNOSIS — R296 Repeated falls: Secondary | ICD-10-CM | POA: Diagnosis not present

## 2016-02-16 LAB — POCT INR: INR: 2.2

## 2016-02-16 NOTE — Progress Notes (Signed)
Patient ID: Shannon [name], female   DOB: [birth year], 82 y.o.   MRN: [id number] PCP: Dr. Osborne Casco  82 yo with paroxysmal atrial fibrillation, chronic systolic CHF, and CAD presents for cardiology followup. She apparently had been in persistent atrial fibrillation since around 8/16.  It appears that she was tachycardic most of the time prior to her recent admission in 7/17.  She had been on amiodarone prior to admission but it was not keeping her in NSR.  She was admitted with acute systolic CHF.  EF had dropped to 20-25% in 7/17 from 50-55% in 10/16.  She was in atrial fibrillation with RVR.  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  Amiodarone was stopped due to suspicion for amiodarone-induced lung toxicity (she was seen by pulmonary).  She was eventually cardioverted to NSR.  Ranolazine was started to try to keep her in NSR.  HR was low in the hospital so she has not been on a beta blocker.  She was extensively diuresed and discharged to SNF.    She is now home from SNF.  She is still wearing oxygen all the time.  Using wheelchair most of the time but also walking some with walker with PT.  Still very weak.  She has a poor appetite and frequently has nausea/"dry heaves."  This has been a chronic problem for her.  SBP running 90s-100s, only takes BP-active meds when SBP > 100.  No falls.  No tachypalpitations, she remains in NSR today.  Sometimes she gets lightheaded with standing.  Mildly short of breath walking with walker but not very active.  No melena or BRBPR.  Weight is down 2 lbs.  She is taking Lasix only occasionally, has had none x 3 days.   ECG: NSR, LAFB, septal Qs, QTc 439 msec  Labs (7/17): Digoxin 1.6 => 0.3, K 4.6, creatinine 0.87 => 0.76, HCT 34, BNP 265  PMH: 1. Atrial fibrillation: Paroxysmal.  Had been persistent since 8/16, was cardioverted to NSR during 7/17 admission.  2. 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  She had been in atrial fibrillation with elevated rate possibly for months prior to 7/17 admission.  - Echo (10/16): EF 50-55%.  - Echo (7/17): EF 20-25%, mildly decreased RV systolic function, severe TR, PA systolic pressure 64 mmHg.  - LHC/RHC (7/17): Patent OM stent, no obstructive CAD.  Mean RA 13, PA 45/25, mean PCWP 16, CI 2.36.  3. CAD: DES to OM in 2007.  LHC (7/17) with patent stent and no new obstructive disease.  4. Type II diabetes 5. Hyperlipidemia 6. TIA 7. Possible amiodarone lung toxicity: Chest CT (7/17) with bronchiectasis/fibrosis, patchy bilateral densities.   Social History   Social History  . Marital status: Married    Spouse name: N/A  . Number of children: N/A  . Years of education: N/A   Occupational History  . Not on file.   Social History Main Topics  . Smoking status: Former Smoker    Packs/day: 1.00    Types: Cigarettes    Quit date: 07/03/1963  . Smokeless tobacco: Never Used  . Alcohol use No  . Drug use: No  . Sexual activity: No   Other Topics Concern  . Not on file   Social History Narrative   Lives in [place] with husband.  Does not routinely exercise but is able to keep house and go food shopping.   Family History  Problem Relation Age of Onset  . Heart  attack Father   . Heart attack Mother   . Heart attack Brother   . Coronary artery disease Sister     CABG  . Alzheimer's disease Sister     X3  . Stroke Mother   . Stroke Sister   . Heart attack Sister    ROS: All systems reviewed and negative except as per HPI.   Current Outpatient Prescriptions  Medication Sig Dispense Refill  . ACCU-CHEK SMARTVIEW test strip     . acetaminophen (TYLENOL) 325 MG tablet Take 2 tablets (650 mg total) by mouth every 4 (four) hours as needed for headache or mild pain.    [name] ALPRAZolam (XANAX) 0.25 MG tablet Take 1 tablet (0.25 mg total) by mouth daily as needed for anxiety. 30 tablet 0  . atorvastatin (LIPITOR) 20 MG tablet  TAKE ONE TABLET BY MOUTH ONE TIME DAILY 90 tablet 3  . benzonatate (TESSALON) 200 MG capsule Take 1 capsule (200 mg total) by mouth 3 (three) times daily as needed for cough. 20 capsule 0  . Cholecalciferol (VITAMIN D PO) Take 1 tablet by mouth daily.    . Cyanocobalamin (VITAMIN B-12 PO) Take 1 tablet by mouth daily.    . digoxin 62.5 MCG TABS Take 0.0625 mg by mouth every other day. Start on 01/18/16. 15 tablet 0  . ferrous sulfate 325 (65 FE) MG tablet Take 325 mg by mouth daily with breakfast.    . furosemide (LASIX) 40 MG tablet Take 0.5 tablets (20 mg total) by mouth every other day. 30 tablet 0  . HYDROcodone-acetaminophen (NORCO/VICODIN) 5-325 MG tablet Take 0.5-1 tablets by mouth every 4 (four) hours as needed for severe pain. 20 tablet 0  . hydrocortisone (PROCTO-PAK) 1 % CREA Apply 1 application topically daily. 1 Tube 0  . metFORMIN (GLUCOPHAGE) 500 MG tablet Take 500 mg by mouth 2 (two) times daily.     . mupirocin ointment (BACTROBAN) 2 % Place into the nose 2 (two) times daily. 22 g 0  . nitroGLYCERIN (NITROSTAT) 0.4 MG SL tablet Place 0.4 mg under the tongue every 5 (five) minutes x 3 doses as needed for chest pain. Up to 3 doses, if pain continues, call 911.    . ondansetron (ZOFRAN) 4 MG tablet Take 4 mg by mouth every 6 (six) hours as needed for nausea or vomiting.     . ranolazine (RANEXA) 500 MG 12 hr tablet Take 1 tablet (500 mg total) by mouth 2 (two) times daily. 60 tablet 0  . spironolactone (ALDACTONE) 25 MG tablet Take 0.5 tablets (12.5 mg total) by mouth daily. 30 tablet 0  . UNABLE TO FIND Med Name: SF MedPass 90 mL TID for nutritional support    . warfarin (COUMADIN) 1 MG tablet Take 1 mg by mouth daily. Take 1/2 tablet qd     No current facility-administered medications for this encounter.    BP (!) 100/58 (BP Location: Right Arm, Patient Position: Sitting, Cuff Size: Normal)   Pulse 60   Wt 116 lb (52.6 kg)   SpO2 95% Comment: 1L  BMI 21.22 kg/m  General: NAD,  frail Neck: JVP 7-8 cm, no thyromegaly or thyroid nodule.  Lungs: Clear to auscultation bilaterally with normal respiratory effort. CV: Nondisplaced PMI.  Heart regular S1/S2, no S3/S4, 2/6 HSM LLSB.  Trace ankle edema.  No carotid bruit.  Normal pedal pulses.  Abdomen: Soft, nontender, no hepatosplenomegaly, no distention.  Skin: Intact without lesions or rashes.  Neurologic: Alert and oriented x 3.  Psych: Normal affect. Extremities: No clubbing or cyanosis.  HEENT: Normal.   Assessment/Plan: 1. Chronic systolic CHF: EF 0000000 on 7/17 echo.  Nonischemic cardiomyopathy, possible tachycardia-mediated cardiomyopathy. She is now in NSR.  NYHA class IIIb symptoms, very weak and frail.  She does not appear significantly volume overloaded on exam.  - I will change her Lasix to 20 mg every other day.  - Continue digoxin, check level today.   - Stop losartan for now with low BP and lightheadedness. .  - Continue spironolactone 12.5 daily.  - Hold off on beta blocker for now with soft BP/orthostatic symptoms. - BMET/BNP today.   2. Atrial fibrillation: Paroxysmal, now in NSR on ranolazine.  Need to keep in NSR given concern for tachy-mediated cardiomyopathy.  She had suspected amiodarone-induced lung toxicity and would not be able to start dofetilide for several months given need for amiodarone wash-out.  - Continue ranolazine, QTc ok on ECG today . - Continue warfarin, has INR followup. - If atrial fibrillation/RVR recurs, would consider ablate/pace.  3. CAD: Patent OM stent on coronary angiography in 7/17.  Continue statin. No ASA with warfarin use.  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  Off amiodarone.  Seeing pulmonary.  Followup in 3 wks.    [name] 02/16/2016

## 2016-02-17 DIAGNOSIS — I252 Old myocardial infarction: Secondary | ICD-10-CM | POA: Diagnosis not present

## 2016-02-17 DIAGNOSIS — G4730 Sleep apnea, unspecified: Secondary | ICD-10-CM | POA: Diagnosis not present

## 2016-02-17 DIAGNOSIS — I2510 Atherosclerotic heart disease of native coronary artery without angina pectoris: Secondary | ICD-10-CM | POA: Diagnosis not present

## 2016-02-17 DIAGNOSIS — I5023 Acute on chronic systolic (congestive) heart failure: Secondary | ICD-10-CM | POA: Diagnosis not present

## 2016-02-17 DIAGNOSIS — J449 Chronic obstructive pulmonary disease, unspecified: Secondary | ICD-10-CM | POA: Diagnosis not present

## 2016-02-17 DIAGNOSIS — R296 Repeated falls: Secondary | ICD-10-CM | POA: Diagnosis not present

## 2016-02-17 DIAGNOSIS — R531 Weakness: Secondary | ICD-10-CM | POA: Diagnosis not present

## 2016-02-17 DIAGNOSIS — I482 Chronic atrial fibrillation: Secondary | ICD-10-CM | POA: Diagnosis not present

## 2016-02-17 DIAGNOSIS — E119 Type 2 diabetes mellitus without complications: Secondary | ICD-10-CM | POA: Diagnosis not present

## 2016-02-18 DIAGNOSIS — E119 Type 2 diabetes mellitus without complications: Secondary | ICD-10-CM | POA: Diagnosis not present

## 2016-02-18 DIAGNOSIS — I252 Old myocardial infarction: Secondary | ICD-10-CM | POA: Diagnosis not present

## 2016-02-18 DIAGNOSIS — R531 Weakness: Secondary | ICD-10-CM | POA: Diagnosis not present

## 2016-02-18 DIAGNOSIS — I5023 Acute on chronic systolic (congestive) heart failure: Secondary | ICD-10-CM | POA: Diagnosis not present

## 2016-02-18 DIAGNOSIS — I2510 Atherosclerotic heart disease of native coronary artery without angina pectoris: Secondary | ICD-10-CM | POA: Diagnosis not present

## 2016-02-18 DIAGNOSIS — R296 Repeated falls: Secondary | ICD-10-CM | POA: Diagnosis not present

## 2016-02-18 DIAGNOSIS — G4730 Sleep apnea, unspecified: Secondary | ICD-10-CM | POA: Diagnosis not present

## 2016-02-18 DIAGNOSIS — J449 Chronic obstructive pulmonary disease, unspecified: Secondary | ICD-10-CM | POA: Diagnosis not present

## 2016-02-18 DIAGNOSIS — I482 Chronic atrial fibrillation: Secondary | ICD-10-CM | POA: Diagnosis not present

## 2016-02-20 DIAGNOSIS — J449 Chronic obstructive pulmonary disease, unspecified: Secondary | ICD-10-CM | POA: Diagnosis not present

## 2016-02-20 DIAGNOSIS — E119 Type 2 diabetes mellitus without complications: Secondary | ICD-10-CM | POA: Diagnosis not present

## 2016-02-20 DIAGNOSIS — I5023 Acute on chronic systolic (congestive) heart failure: Secondary | ICD-10-CM | POA: Diagnosis not present

## 2016-02-20 DIAGNOSIS — G4730 Sleep apnea, unspecified: Secondary | ICD-10-CM | POA: Diagnosis not present

## 2016-02-20 DIAGNOSIS — I482 Chronic atrial fibrillation: Secondary | ICD-10-CM | POA: Diagnosis not present

## 2016-02-20 DIAGNOSIS — R296 Repeated falls: Secondary | ICD-10-CM | POA: Diagnosis not present

## 2016-02-20 DIAGNOSIS — I2510 Atherosclerotic heart disease of native coronary artery without angina pectoris: Secondary | ICD-10-CM | POA: Diagnosis not present

## 2016-02-20 DIAGNOSIS — R531 Weakness: Secondary | ICD-10-CM | POA: Diagnosis not present

## 2016-02-20 DIAGNOSIS — I252 Old myocardial infarction: Secondary | ICD-10-CM | POA: Diagnosis not present

## 2016-02-21 DIAGNOSIS — I252 Old myocardial infarction: Secondary | ICD-10-CM | POA: Diagnosis not present

## 2016-02-21 DIAGNOSIS — G4730 Sleep apnea, unspecified: Secondary | ICD-10-CM | POA: Diagnosis not present

## 2016-02-21 DIAGNOSIS — Z7984 Long term (current) use of oral hypoglycemic drugs: Secondary | ICD-10-CM | POA: Diagnosis not present

## 2016-02-21 DIAGNOSIS — E11621 Type 2 diabetes mellitus with foot ulcer: Secondary | ICD-10-CM | POA: Diagnosis not present

## 2016-02-21 DIAGNOSIS — I890 Lymphedema, not elsewhere classified: Secondary | ICD-10-CM | POA: Diagnosis not present

## 2016-02-21 DIAGNOSIS — L97522 Non-pressure chronic ulcer of other part of left foot with fat layer exposed: Secondary | ICD-10-CM | POA: Diagnosis not present

## 2016-02-21 DIAGNOSIS — Z87891 Personal history of nicotine dependence: Secondary | ICD-10-CM | POA: Diagnosis not present

## 2016-02-21 DIAGNOSIS — I4891 Unspecified atrial fibrillation: Secondary | ICD-10-CM | POA: Diagnosis not present

## 2016-02-21 DIAGNOSIS — I2510 Atherosclerotic heart disease of native coronary artery without angina pectoris: Secondary | ICD-10-CM | POA: Diagnosis not present

## 2016-02-21 DIAGNOSIS — I509 Heart failure, unspecified: Secondary | ICD-10-CM | POA: Diagnosis not present

## 2016-02-22 DIAGNOSIS — E119 Type 2 diabetes mellitus without complications: Secondary | ICD-10-CM | POA: Diagnosis not present

## 2016-02-22 DIAGNOSIS — I2510 Atherosclerotic heart disease of native coronary artery without angina pectoris: Secondary | ICD-10-CM | POA: Diagnosis not present

## 2016-02-22 DIAGNOSIS — J449 Chronic obstructive pulmonary disease, unspecified: Secondary | ICD-10-CM | POA: Diagnosis not present

## 2016-02-22 DIAGNOSIS — G4730 Sleep apnea, unspecified: Secondary | ICD-10-CM | POA: Diagnosis not present

## 2016-02-22 DIAGNOSIS — I5023 Acute on chronic systolic (congestive) heart failure: Secondary | ICD-10-CM | POA: Diagnosis not present

## 2016-02-22 DIAGNOSIS — I482 Chronic atrial fibrillation: Secondary | ICD-10-CM | POA: Diagnosis not present

## 2016-02-22 DIAGNOSIS — I252 Old myocardial infarction: Secondary | ICD-10-CM | POA: Diagnosis not present

## 2016-02-22 DIAGNOSIS — R531 Weakness: Secondary | ICD-10-CM | POA: Diagnosis not present

## 2016-02-22 DIAGNOSIS — R296 Repeated falls: Secondary | ICD-10-CM | POA: Diagnosis not present

## 2016-02-23 ENCOUNTER — Ambulatory Visit (INDEPENDENT_AMBULATORY_CARE_PROVIDER_SITE_OTHER): Payer: Commercial Managed Care - HMO | Admitting: Internal Medicine

## 2016-02-23 DIAGNOSIS — J449 Chronic obstructive pulmonary disease, unspecified: Secondary | ICD-10-CM | POA: Diagnosis not present

## 2016-02-23 DIAGNOSIS — E119 Type 2 diabetes mellitus without complications: Secondary | ICD-10-CM | POA: Diagnosis not present

## 2016-02-23 DIAGNOSIS — R531 Weakness: Secondary | ICD-10-CM | POA: Diagnosis not present

## 2016-02-23 DIAGNOSIS — R296 Repeated falls: Secondary | ICD-10-CM | POA: Diagnosis not present

## 2016-02-23 DIAGNOSIS — I482 Chronic atrial fibrillation, unspecified: Secondary | ICD-10-CM

## 2016-02-23 DIAGNOSIS — Z7901 Long term (current) use of anticoagulants: Secondary | ICD-10-CM

## 2016-02-23 DIAGNOSIS — I2510 Atherosclerotic heart disease of native coronary artery without angina pectoris: Secondary | ICD-10-CM | POA: Diagnosis not present

## 2016-02-23 DIAGNOSIS — I5023 Acute on chronic systolic (congestive) heart failure: Secondary | ICD-10-CM | POA: Diagnosis not present

## 2016-02-23 DIAGNOSIS — I252 Old myocardial infarction: Secondary | ICD-10-CM | POA: Diagnosis not present

## 2016-02-23 DIAGNOSIS — G4730 Sleep apnea, unspecified: Secondary | ICD-10-CM | POA: Diagnosis not present

## 2016-02-23 LAB — POCT INR: INR: 3.6

## 2016-02-24 ENCOUNTER — Encounter: Payer: Self-pay | Admitting: Pharmacist

## 2016-02-24 DIAGNOSIS — Z7189 Other specified counseling: Secondary | ICD-10-CM

## 2016-02-24 NOTE — Telephone Encounter (Signed)
This encounter was created in error - please disregard.

## 2016-02-28 DIAGNOSIS — I509 Heart failure, unspecified: Secondary | ICD-10-CM | POA: Diagnosis not present

## 2016-02-28 DIAGNOSIS — I482 Chronic atrial fibrillation: Secondary | ICD-10-CM | POA: Diagnosis not present

## 2016-02-28 DIAGNOSIS — L97522 Non-pressure chronic ulcer of other part of left foot with fat layer exposed: Secondary | ICD-10-CM | POA: Diagnosis not present

## 2016-02-28 DIAGNOSIS — E119 Type 2 diabetes mellitus without complications: Secondary | ICD-10-CM | POA: Diagnosis not present

## 2016-02-28 DIAGNOSIS — I4891 Unspecified atrial fibrillation: Secondary | ICD-10-CM | POA: Diagnosis not present

## 2016-02-28 DIAGNOSIS — L97512 Non-pressure chronic ulcer of other part of right foot with fat layer exposed: Secondary | ICD-10-CM | POA: Diagnosis not present

## 2016-02-28 DIAGNOSIS — E11621 Type 2 diabetes mellitus with foot ulcer: Secondary | ICD-10-CM | POA: Diagnosis not present

## 2016-02-28 DIAGNOSIS — Z7984 Long term (current) use of oral hypoglycemic drugs: Secondary | ICD-10-CM | POA: Diagnosis not present

## 2016-02-28 DIAGNOSIS — J449 Chronic obstructive pulmonary disease, unspecified: Secondary | ICD-10-CM | POA: Diagnosis not present

## 2016-02-28 DIAGNOSIS — R531 Weakness: Secondary | ICD-10-CM | POA: Diagnosis not present

## 2016-02-28 DIAGNOSIS — I2510 Atherosclerotic heart disease of native coronary artery without angina pectoris: Secondary | ICD-10-CM | POA: Diagnosis not present

## 2016-02-28 DIAGNOSIS — I252 Old myocardial infarction: Secondary | ICD-10-CM | POA: Diagnosis not present

## 2016-02-28 DIAGNOSIS — R296 Repeated falls: Secondary | ICD-10-CM | POA: Diagnosis not present

## 2016-02-28 DIAGNOSIS — I5023 Acute on chronic systolic (congestive) heart failure: Secondary | ICD-10-CM | POA: Diagnosis not present

## 2016-02-28 DIAGNOSIS — G4730 Sleep apnea, unspecified: Secondary | ICD-10-CM | POA: Diagnosis not present

## 2016-02-28 DIAGNOSIS — I890 Lymphedema, not elsewhere classified: Secondary | ICD-10-CM | POA: Diagnosis not present

## 2016-02-28 DIAGNOSIS — Z87891 Personal history of nicotine dependence: Secondary | ICD-10-CM | POA: Diagnosis not present

## 2016-02-29 DIAGNOSIS — G4730 Sleep apnea, unspecified: Secondary | ICD-10-CM | POA: Diagnosis not present

## 2016-02-29 DIAGNOSIS — I1310 Hypertensive heart and chronic kidney disease without heart failure, with stage 1 through stage 4 chronic kidney disease, or unspecified chronic kidney disease: Secondary | ICD-10-CM | POA: Diagnosis not present

## 2016-02-29 DIAGNOSIS — E1151 Type 2 diabetes mellitus with diabetic peripheral angiopathy without gangrene: Secondary | ICD-10-CM | POA: Diagnosis not present

## 2016-02-29 DIAGNOSIS — E119 Type 2 diabetes mellitus without complications: Secondary | ICD-10-CM | POA: Diagnosis not present

## 2016-02-29 DIAGNOSIS — R531 Weakness: Secondary | ICD-10-CM | POA: Diagnosis not present

## 2016-02-29 DIAGNOSIS — I10 Essential (primary) hypertension: Secondary | ICD-10-CM | POA: Diagnosis not present

## 2016-02-29 DIAGNOSIS — E559 Vitamin D deficiency, unspecified: Secondary | ICD-10-CM | POA: Diagnosis not present

## 2016-02-29 DIAGNOSIS — D692 Other nonthrombocytopenic purpura: Secondary | ICD-10-CM | POA: Diagnosis not present

## 2016-02-29 DIAGNOSIS — I2510 Atherosclerotic heart disease of native coronary artery without angina pectoris: Secondary | ICD-10-CM | POA: Diagnosis not present

## 2016-02-29 DIAGNOSIS — J449 Chronic obstructive pulmonary disease, unspecified: Secondary | ICD-10-CM | POA: Diagnosis not present

## 2016-02-29 DIAGNOSIS — Z681 Body mass index (BMI) 19 or less, adult: Secondary | ICD-10-CM | POA: Diagnosis not present

## 2016-02-29 DIAGNOSIS — I5023 Acute on chronic systolic (congestive) heart failure: Secondary | ICD-10-CM | POA: Diagnosis not present

## 2016-02-29 DIAGNOSIS — R6889 Other general symptoms and signs: Secondary | ICD-10-CM | POA: Diagnosis not present

## 2016-02-29 DIAGNOSIS — I252 Old myocardial infarction: Secondary | ICD-10-CM | POA: Diagnosis not present

## 2016-02-29 DIAGNOSIS — E7800 Pure hypercholesterolemia, unspecified: Secondary | ICD-10-CM | POA: Diagnosis not present

## 2016-02-29 DIAGNOSIS — N183 Chronic kidney disease, stage 3 (moderate): Secondary | ICD-10-CM | POA: Diagnosis not present

## 2016-02-29 DIAGNOSIS — I482 Chronic atrial fibrillation: Secondary | ICD-10-CM | POA: Diagnosis not present

## 2016-02-29 DIAGNOSIS — R296 Repeated falls: Secondary | ICD-10-CM | POA: Diagnosis not present

## 2016-02-29 LAB — POCT INR: INR: 1.6

## 2016-03-01 ENCOUNTER — Ambulatory Visit (INDEPENDENT_AMBULATORY_CARE_PROVIDER_SITE_OTHER): Payer: Commercial Managed Care - HMO | Admitting: Cardiovascular Disease

## 2016-03-01 ENCOUNTER — Telehealth: Payer: Self-pay | Admitting: Cardiovascular Disease

## 2016-03-01 DIAGNOSIS — I482 Chronic atrial fibrillation, unspecified: Secondary | ICD-10-CM

## 2016-03-01 DIAGNOSIS — Z7901 Long term (current) use of anticoagulants: Secondary | ICD-10-CM

## 2016-03-01 NOTE — Telephone Encounter (Signed)
From patient's chart:  Spoke with [facility] nurse with [facility] while in home with pt, advised to take 2.5mg  today and tomorrow, then continue taking 1.25mg  every day. Pt is being discharge don 9/4 and would like [facility] to manage her anticoagulation. [facility] nurse [phone]) is reaching out to them to see if this possible. Will contact us if any additional information is required.   Will follow-up later to see if issue was resolved.

## 2016-03-01 NOTE — Telephone Encounter (Signed)
Received call transfer from operator, call from daughter-in-law Sunday Spillers.  She reports that patient has now been discharged from home health and will continue to stay with son/daughter-in-law for the near future. They live in [place] and are unable to bring patient to [facility] for INR checks.  States they were told they could go to our [facility], but when they called, were told they could not, as she was not a patient there.    Explained to upset daughter in law that we don't have a Coumadin Clinic in [place], and this was apparently tied to the [facility] group.    Patient was previously on Pradaxa, but switched to warfarin in April of this year due to GI bleed.   The switch was made to warfarin as opposed to another NOAC because her husband was on warfarin as well at that time.    Now that she is unable to travel to [facility] for INR checks, will switch patient to Eliquis.  Her weight is 65 kg, age 82 and most recent SCr is 1.55. Her CrCl is 28.7  I would recommend starting her on Eliquis 2.5 mg bid and repeat BMET in the next week to confirm her SCr.    Her SCr has increased from <1.0 to 1.55 in the past month, will need to recheck to verify that it is safe to continue with Eliquis.    Called family to discuss switch to Eliquis.  Was informed that patient has no prescription coverage, so Eliquis will be cost prohibitive.   Family is agreeable to taking patient to [facility] next week for INR check.  She has appt with HF clinic on Sept 6, will have INR drawn then.  They can contact [facility] office for assistance with dosing if needed, and schedule patient for follow up at [facility]. Coumadin clinic

## 2016-03-01 NOTE — Telephone Encounter (Signed)
New Message:   Pt is staying with her son in [place]. She wants to know if there is some place in [place] where she can get her Coumadin?

## 2016-03-02 DIAGNOSIS — I252 Old myocardial infarction: Secondary | ICD-10-CM | POA: Diagnosis not present

## 2016-03-02 DIAGNOSIS — I482 Chronic atrial fibrillation: Secondary | ICD-10-CM | POA: Diagnosis not present

## 2016-03-02 DIAGNOSIS — R296 Repeated falls: Secondary | ICD-10-CM | POA: Diagnosis not present

## 2016-03-02 DIAGNOSIS — I2510 Atherosclerotic heart disease of native coronary artery without angina pectoris: Secondary | ICD-10-CM | POA: Diagnosis not present

## 2016-03-02 DIAGNOSIS — E119 Type 2 diabetes mellitus without complications: Secondary | ICD-10-CM | POA: Diagnosis not present

## 2016-03-02 DIAGNOSIS — I5023 Acute on chronic systolic (congestive) heart failure: Secondary | ICD-10-CM | POA: Diagnosis not present

## 2016-03-02 DIAGNOSIS — J449 Chronic obstructive pulmonary disease, unspecified: Secondary | ICD-10-CM | POA: Diagnosis not present

## 2016-03-02 DIAGNOSIS — R531 Weakness: Secondary | ICD-10-CM | POA: Diagnosis not present

## 2016-03-02 DIAGNOSIS — G4730 Sleep apnea, unspecified: Secondary | ICD-10-CM | POA: Diagnosis not present

## 2016-03-02 NOTE — Telephone Encounter (Signed)
Agree with Tommy Medal, Pharm D.

## 2016-03-06 ENCOUNTER — Encounter (HOSPITAL_BASED_OUTPATIENT_CLINIC_OR_DEPARTMENT_OTHER): Payer: Commercial Managed Care - HMO | Attending: Surgery

## 2016-03-06 DIAGNOSIS — D66 Hereditary factor VIII deficiency: Secondary | ICD-10-CM | POA: Insufficient documentation

## 2016-03-06 DIAGNOSIS — I480 Paroxysmal atrial fibrillation: Secondary | ICD-10-CM | POA: Insufficient documentation

## 2016-03-06 DIAGNOSIS — Z09 Encounter for follow-up examination after completed treatment for conditions other than malignant neoplasm: Secondary | ICD-10-CM | POA: Insufficient documentation

## 2016-03-06 DIAGNOSIS — R296 Repeated falls: Secondary | ICD-10-CM | POA: Diagnosis not present

## 2016-03-06 DIAGNOSIS — J449 Chronic obstructive pulmonary disease, unspecified: Secondary | ICD-10-CM | POA: Diagnosis not present

## 2016-03-06 DIAGNOSIS — I890 Lymphedema, not elsewhere classified: Secondary | ICD-10-CM | POA: Diagnosis not present

## 2016-03-06 DIAGNOSIS — G4730 Sleep apnea, unspecified: Secondary | ICD-10-CM | POA: Insufficient documentation

## 2016-03-06 DIAGNOSIS — I252 Old myocardial infarction: Secondary | ICD-10-CM | POA: Diagnosis not present

## 2016-03-06 DIAGNOSIS — I509 Heart failure, unspecified: Secondary | ICD-10-CM | POA: Insufficient documentation

## 2016-03-06 DIAGNOSIS — E119 Type 2 diabetes mellitus without complications: Secondary | ICD-10-CM | POA: Diagnosis not present

## 2016-03-06 DIAGNOSIS — R531 Weakness: Secondary | ICD-10-CM | POA: Diagnosis not present

## 2016-03-06 DIAGNOSIS — Z9862 Peripheral vascular angioplasty status: Secondary | ICD-10-CM | POA: Insufficient documentation

## 2016-03-06 DIAGNOSIS — I2510 Atherosclerotic heart disease of native coronary artery without angina pectoris: Secondary | ICD-10-CM | POA: Diagnosis not present

## 2016-03-06 DIAGNOSIS — I482 Chronic atrial fibrillation: Secondary | ICD-10-CM | POA: Diagnosis not present

## 2016-03-06 DIAGNOSIS — E1151 Type 2 diabetes mellitus with diabetic peripheral angiopathy without gangrene: Secondary | ICD-10-CM | POA: Insufficient documentation

## 2016-03-06 DIAGNOSIS — Z87891 Personal history of nicotine dependence: Secondary | ICD-10-CM | POA: Insufficient documentation

## 2016-03-06 DIAGNOSIS — Z8631 Personal history of diabetic foot ulcer: Secondary | ICD-10-CM | POA: Insufficient documentation

## 2016-03-06 DIAGNOSIS — I5023 Acute on chronic systolic (congestive) heart failure: Secondary | ICD-10-CM | POA: Diagnosis not present

## 2016-03-07 ENCOUNTER — Encounter (HOSPITAL_COMMUNITY): Payer: Self-pay

## 2016-03-07 ENCOUNTER — Ambulatory Visit (HOSPITAL_COMMUNITY)
Admission: RE | Admit: 2016-03-07 | Discharge: 2016-03-07 | Disposition: A | Discharge status: Home / Self Care | Payer: Commercial Managed Care - HMO | Source: Ambulatory Visit | Attending: Internal Medicine | Admitting: Internal Medicine

## 2016-03-07 VITALS — BP 108/64 | HR 57 | Wt 113.8 lb

## 2016-03-07 DIAGNOSIS — Z823 Family history of stroke: Secondary | ICD-10-CM | POA: Insufficient documentation

## 2016-03-07 DIAGNOSIS — Z8249 Family history of ischemic heart disease and other diseases of the circulatory system: Secondary | ICD-10-CM | POA: Insufficient documentation

## 2016-03-07 DIAGNOSIS — E119 Type 2 diabetes mellitus without complications: Secondary | ICD-10-CM | POA: Insufficient documentation

## 2016-03-07 DIAGNOSIS — Z8673 Personal history of transient ischemic attack (TIA), and cerebral infarction without residual deficits: Secondary | ICD-10-CM | POA: Diagnosis not present

## 2016-03-07 DIAGNOSIS — I2510 Atherosclerotic heart disease of native coronary artery without angina pectoris: Secondary | ICD-10-CM | POA: Insufficient documentation

## 2016-03-07 DIAGNOSIS — I482 Chronic atrial fibrillation, unspecified: Secondary | ICD-10-CM

## 2016-03-07 DIAGNOSIS — Z87891 Personal history of nicotine dependence: Secondary | ICD-10-CM | POA: Insufficient documentation

## 2016-03-07 DIAGNOSIS — Z7984 Long term (current) use of oral hypoglycemic drugs: Secondary | ICD-10-CM | POA: Insufficient documentation

## 2016-03-07 DIAGNOSIS — E785 Hyperlipidemia, unspecified: Secondary | ICD-10-CM | POA: Diagnosis not present

## 2016-03-07 DIAGNOSIS — I429 Cardiomyopathy, unspecified: Secondary | ICD-10-CM | POA: Insufficient documentation

## 2016-03-07 DIAGNOSIS — I5022 Chronic systolic (congestive) heart failure: Secondary | ICD-10-CM | POA: Diagnosis not present

## 2016-03-07 DIAGNOSIS — I481 Persistent atrial fibrillation: Secondary | ICD-10-CM | POA: Insufficient documentation

## 2016-03-07 DIAGNOSIS — T462X5A Adverse effect of other antidysrhythmic drugs, initial encounter: Secondary | ICD-10-CM

## 2016-03-07 DIAGNOSIS — I480 Paroxysmal atrial fibrillation: Secondary | ICD-10-CM | POA: Insufficient documentation

## 2016-03-07 DIAGNOSIS — Z7901 Long term (current) use of anticoagulants: Secondary | ICD-10-CM | POA: Diagnosis not present

## 2016-03-07 DIAGNOSIS — J984 Other disorders of lung: Secondary | ICD-10-CM

## 2016-03-07 LAB — BASIC METABOLIC PANEL
Anion gap: 8 (ref 5–15)
BUN: 27 mg/dL — ABNORMAL HIGH (ref 6–20)
CALCIUM: 9.6 mg/dL (ref 8.9–10.3)
CO2: 26 mmol/L (ref 22–32)
CREATININE: 1.36 mg/dL — AB (ref 0.44–1.00)
Chloride: 104 mmol/L (ref 101–111)
GFR calc non Af Amer: 35 mL/min — ABNORMAL LOW (ref >60)
GFR, EST AFRICAN AMERICAN: 41 mL/min — AB (ref >60)
Glucose, Bld: 101 mg/dL — ABNORMAL HIGH (ref 65–99)
Potassium: 5 mmol/L (ref 3.5–5.1)
SODIUM: 138 mmol/L (ref 135–145)

## 2016-03-07 LAB — PROTIME-INR
INR: 2.21
PROTHROMBIN TIME: 24.9 s — AB (ref 11.4–15.2)

## 2016-03-07 NOTE — Progress Notes (Addendum)
Patient ID: Shannon [name], female   DOB: 03/27/1932, 82 y.o.   MRN: [id number]     Advanced Heart Failure Clinic Note   PCP: Dr. Osborne Casco  82 yo with paroxysmal atrial fibrillation, chronic systolic CHF, and CAD presents for cardiology followup. She apparently had been in persistent atrial fibrillation since around 8/16.  It appears that she was tachycardic most of the time prior to her recent admission in 7/17.  She had been on amiodarone prior to admission but it was not keeping her in NSR.  She was admitted with acute systolic CHF.  EF had dropped to 20-25% in 7/17 from 50-55% in 10/16.  She was in atrial fibrillation with RVR.  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  Amiodarone was stopped due to suspicion for amiodarone-induced lung toxicity (she was seen by pulmonary).  She was eventually cardioverted to NSR.  Ranolazine was started to try to keep her in NSR.  HR was low in the hospital so she has not been on a beta blocker.  She was extensively diuresed and discharged to SNF.    She presents today for regular follow up. At last visit stopped losartan with orthostasis.Taking lasix every other day and feels much better with med changes. Still complaining of early satiety with nausea and dry heaves. Not related to any particular meal, time of day, or activity. Waxes and wanes.  SBP remains in 90-100s. Denies any falls. Breathing feels like it's getting better. 02 running at 93-94% at a time.  Didn't wear 02 to visit today and sats in upper 90s and 100. Occasional palpitations. Weight is down about 3 lbs. No melena or BRBPR.    ECG: Junctional rhythm 54 bpm, LAD, QTc 411  Labs (7/17): Digoxin 1.6 => 0.3, K 4.6, creatinine 0.87 => 0.76, HCT 34, BNP 265  PMH: 1. Atrial fibrillation: Paroxysmal.  Had been persistent since 8/16, was cardioverted to NSR during 7/17 admission.  2. 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  She had been in atrial fibrillation with elevated rate possibly for months prior to 7/17 admission.  - Echo (10/16): EF 50-55%.  - Echo (7/17): EF 20-25%, mildly decreased RV systolic function, severe TR, PA systolic pressure 64 mmHg.  - LHC/RHC (7/17): Patent OM stent, no obstructive CAD.  Mean RA 13, PA 45/25, mean PCWP 16, CI 2.36.  3. CAD: DES to OM in 2007.  LHC (7/17) with patent stent and no new obstructive disease.  4. Type II diabetes 5. Hyperlipidemia 6. TIA 7. Possible amiodarone lung toxicity: Chest CT (7/17) with bronchiectasis/fibrosis, patchy bilateral densities.   Social History   Social History  . Marital status: Married    Spouse name: N/A  . Number of children: N/A  . Years of education: N/A   Occupational History  . Not on file.   Social History Main Topics  . Smoking status: Former Smoker    Packs/day: 1.00    Types: Cigarettes    Quit date: 07/03/1963  . Smokeless tobacco: Never Used  . Alcohol use No  . Drug use: No  . Sexual activity: No   Other Topics Concern  . Not on file   Social History Narrative   Lives in [place] with husband.  Does not routinely exercise but is able to keep house and go food shopping.   Family History  Problem Relation Age of Onset  . Heart attack Father   . Heart attack Mother   . Heart  attack Brother   . Coronary artery disease Sister     CABG  . Alzheimer's disease Sister     X3  . Stroke Mother   . Stroke Sister   . Heart attack Sister    ROS: All systems reviewed and negative except as per HPI.   Current Outpatient Prescriptions  Medication Sig Dispense Refill  . ACCU-CHEK SMARTVIEW test strip     . acetaminophen (TYLENOL) 325 MG tablet Take 2 tablets (650 mg total) by mouth every 4 (four) hours as needed for headache or mild pain.    [name] ALPRAZolam (XANAX) 0.25 MG tablet Take 1 tablet (0.25 mg total) by mouth daily as needed for anxiety. 30 tablet 0  . atorvastatin (LIPITOR) 20 MG tablet  TAKE ONE TABLET BY MOUTH ONE TIME DAILY 90 tablet 3  . benzonatate (TESSALON) 200 MG capsule Take 1 capsule (200 mg total) by mouth 3 (three) times daily as needed for cough. 20 capsule 0  . Cholecalciferol (VITAMIN D PO) Take 1 tablet by mouth daily.    . Cyanocobalamin (VITAMIN B-12 PO) Take 1 tablet by mouth daily.    . digoxin 62.5 MCG TABS Take 0.0625 mg by mouth every other day. Start on 01/18/16. 15 tablet 0  . ferrous sulfate 325 (65 FE) MG tablet Take 325 mg by mouth daily with breakfast.    . furosemide (LASIX) 40 MG tablet Take 0.5 tablets (20 mg total) by mouth every other day. 30 tablet 0  . HYDROcodone-acetaminophen (NORCO/VICODIN) 5-325 MG tablet Take 0.5-1 tablets by mouth every 4 (four) hours as needed for severe pain. 20 tablet 0  . hydrocortisone (PROCTO-PAK) 1 % CREA Apply 1 application topically daily. 1 Tube 0  . metFORMIN (GLUCOPHAGE) 500 MG tablet Take 500 mg by mouth 2 (two) times daily.     . mupirocin ointment (BACTROBAN) 2 % Place into the nose 2 (two) times daily. 22 g 0  . nitroGLYCERIN (NITROSTAT) 0.4 MG SL tablet Place 0.4 mg under the tongue every 5 (five) minutes x 3 doses as needed for chest pain. Up to 3 doses, if pain continues, call 911.    . ondansetron (ZOFRAN) 4 MG tablet Take 4 mg by mouth every 6 (six) hours as needed for nausea or vomiting.     . ranolazine (RANEXA) 500 MG 12 hr tablet Take 1 tablet (500 mg total) by mouth 2 (two) times daily. 60 tablet 0  . spironolactone (ALDACTONE) 25 MG tablet Take 0.5 tablets (12.5 mg total) by mouth daily. 30 tablet 0  . UNABLE TO FIND Med Name: SF MedPass 90 mL TID for nutritional support    . warfarin (COUMADIN) 1 MG tablet Take 1 mg by mouth daily. Take 1/2 tablet qd     No current facility-administered medications for this encounter.    BP 108/64 (BP Location: Left Arm, Patient Position: Sitting, Cuff Size: Normal)   Pulse (!) 57   Wt 113 lb 12.8 oz (51.6 kg)   SpO2 100%   BMI 20.81 kg/m    Wt Readings  from Last 3 Encounters:  03/07/16 113 lb 12.8 oz (51.6 kg)  02/15/16 116 lb (52.6 kg)  02/03/16 115 lb 12.8 oz (52.5 kg)    General: NAD, frail Neck: JVP 7-8 cm, no thyromegaly or thyroid nodule.  Lungs: Clear to auscultation bilaterally with normal respiratory effort. CV: Nondisplaced PMI.  Heart regular S1/S2, no S3/S4, 2/6 HSM LLSB.  Trace ankle edema.  No carotid bruit.  Normal pedal pulses.  Abdomen: Soft, nontender, no hepatosplenomegaly, no distention.  Skin: Intact without lesions or rashes.  Neurologic: Alert and oriented x 3.  Psych: Normal affect. Extremities: No clubbing or cyanosis.  HEENT: Normal.   Assessment/Plan: 1. Chronic systolic CHF: EF 0000000 on 7/17 echo.  Nonischemic cardiomyopathy, possible tachycardia-mediated cardiomyopathy. She remains in NSR.  NYHA class III to IIIb symptoms - Volume status appears stable on exam. BMET today.   - Continue Lasix 20 mg every other day.   - Continue digoxin. Level 0.7 at last check.  - Continue spironolactone 12.5 daily.  - Hold off on beta blocker for now with soft BP/orthostatic symptoms. Off ARB for same.  - Repeat BMET/BNP today.   2. Atrial fibrillation: Paroxysmal, now in NSR on ranolazine.  Need to keep in NSR given concern for tachy-mediated cardiomyopathy.  She had suspected amiodarone-induced lung toxicity and would not be able to start dofetilide for several months given need for amiodarone wash-out.  - Continue ranolazine, QTc stable on EKG today.  - Continue warfarin. Recent INR 1.6. Will check today and forward to Coumadin Clinic.  - If atrial fibrillation/RVR recurs, would consider ablation with pacing.  3. CAD: Patent OM stent on coronary angiography in 7/17.  Continue statin. No ASA with warfarin use.  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  Off amiodarone.  Seeing pulmonary.  BMET and INR today.  Will forward to coumadin clinic and see if pt is candidate for home INR monitoring with limited  transportation.    Will also have Pharm-D look into pricing of Eliquis for her. She has Medicare and currently would be too expensive OOP cost.   Another option would be for patient to move her PCP to [facility], and have INR monitored there.   Follow up 6 weeks with MD.   Shirley Friar, PA-C 03/07/2016  Total time spent > 25 minutes. Over half that spent discussing the above.

## 2016-03-07 NOTE — Patient Instructions (Signed)
LABS TODAY  The Coumadin Clinic will be in touch with your regarding your INR and coumadin dosing.  Your physician recommends that you schedule a follow-up appointment in: 6 weeks with Dr [name]  Do the following things EVERYDAY: 1) Weigh yourself in the morning before breakfast. Write it down and keep it in a log. 2) Take your medicines as prescribed 3) Eat low salt foods-Limit salt (sodium) to 2000 mg per day.  4) Stay as active as you can everyday 5) Limit all fluids for the day to less than 2 liters

## 2016-03-08 ENCOUNTER — Ambulatory Visit (INDEPENDENT_AMBULATORY_CARE_PROVIDER_SITE_OTHER): Payer: Commercial Managed Care - HMO | Admitting: Cardiovascular Disease

## 2016-03-08 DIAGNOSIS — G4730 Sleep apnea, unspecified: Secondary | ICD-10-CM | POA: Diagnosis not present

## 2016-03-08 DIAGNOSIS — I5023 Acute on chronic systolic (congestive) heart failure: Secondary | ICD-10-CM | POA: Diagnosis not present

## 2016-03-08 DIAGNOSIS — J449 Chronic obstructive pulmonary disease, unspecified: Secondary | ICD-10-CM | POA: Diagnosis not present

## 2016-03-08 DIAGNOSIS — R296 Repeated falls: Secondary | ICD-10-CM | POA: Diagnosis not present

## 2016-03-08 DIAGNOSIS — Z7901 Long term (current) use of anticoagulants: Secondary | ICD-10-CM

## 2016-03-08 DIAGNOSIS — I482 Chronic atrial fibrillation: Secondary | ICD-10-CM | POA: Diagnosis not present

## 2016-03-08 DIAGNOSIS — R531 Weakness: Secondary | ICD-10-CM | POA: Diagnosis not present

## 2016-03-08 DIAGNOSIS — I2510 Atherosclerotic heart disease of native coronary artery without angina pectoris: Secondary | ICD-10-CM | POA: Diagnosis not present

## 2016-03-08 DIAGNOSIS — I252 Old myocardial infarction: Secondary | ICD-10-CM | POA: Diagnosis not present

## 2016-03-08 DIAGNOSIS — E119 Type 2 diabetes mellitus without complications: Secondary | ICD-10-CM | POA: Diagnosis not present

## 2016-03-10 DIAGNOSIS — R0602 Shortness of breath: Secondary | ICD-10-CM | POA: Diagnosis not present

## 2016-03-10 DIAGNOSIS — I2510 Atherosclerotic heart disease of native coronary artery without angina pectoris: Secondary | ICD-10-CM | POA: Diagnosis not present

## 2016-03-10 DIAGNOSIS — R269 Unspecified abnormalities of gait and mobility: Secondary | ICD-10-CM | POA: Diagnosis not present

## 2016-03-10 DIAGNOSIS — Z5189 Encounter for other specified aftercare: Secondary | ICD-10-CM | POA: Diagnosis not present

## 2016-03-10 DIAGNOSIS — M6281 Muscle weakness (generalized): Secondary | ICD-10-CM | POA: Diagnosis not present

## 2016-03-10 DIAGNOSIS — I5021 Acute systolic (congestive) heart failure: Secondary | ICD-10-CM | POA: Diagnosis not present

## 2016-03-10 DIAGNOSIS — J9601 Acute respiratory failure with hypoxia: Secondary | ICD-10-CM | POA: Diagnosis not present

## 2016-03-10 DIAGNOSIS — R1312 Dysphagia, oropharyngeal phase: Secondary | ICD-10-CM | POA: Diagnosis not present

## 2016-03-11 ENCOUNTER — Other Ambulatory Visit: Payer: Self-pay | Admitting: Adult Health

## 2016-03-13 DIAGNOSIS — E119 Type 2 diabetes mellitus without complications: Secondary | ICD-10-CM | POA: Diagnosis not present

## 2016-03-13 DIAGNOSIS — J449 Chronic obstructive pulmonary disease, unspecified: Secondary | ICD-10-CM | POA: Diagnosis not present

## 2016-03-13 DIAGNOSIS — G4730 Sleep apnea, unspecified: Secondary | ICD-10-CM | POA: Diagnosis not present

## 2016-03-13 DIAGNOSIS — R296 Repeated falls: Secondary | ICD-10-CM | POA: Diagnosis not present

## 2016-03-13 DIAGNOSIS — I5023 Acute on chronic systolic (congestive) heart failure: Secondary | ICD-10-CM | POA: Diagnosis not present

## 2016-03-13 DIAGNOSIS — I482 Chronic atrial fibrillation: Secondary | ICD-10-CM | POA: Diagnosis not present

## 2016-03-13 DIAGNOSIS — I2510 Atherosclerotic heart disease of native coronary artery without angina pectoris: Secondary | ICD-10-CM | POA: Diagnosis not present

## 2016-03-13 DIAGNOSIS — R531 Weakness: Secondary | ICD-10-CM | POA: Diagnosis not present

## 2016-03-13 DIAGNOSIS — I252 Old myocardial infarction: Secondary | ICD-10-CM | POA: Diagnosis not present

## 2016-03-15 DIAGNOSIS — R296 Repeated falls: Secondary | ICD-10-CM | POA: Diagnosis not present

## 2016-03-15 DIAGNOSIS — J449 Chronic obstructive pulmonary disease, unspecified: Secondary | ICD-10-CM | POA: Diagnosis not present

## 2016-03-15 DIAGNOSIS — E119 Type 2 diabetes mellitus without complications: Secondary | ICD-10-CM | POA: Diagnosis not present

## 2016-03-15 DIAGNOSIS — I2510 Atherosclerotic heart disease of native coronary artery without angina pectoris: Secondary | ICD-10-CM | POA: Diagnosis not present

## 2016-03-15 DIAGNOSIS — I482 Chronic atrial fibrillation: Secondary | ICD-10-CM | POA: Diagnosis not present

## 2016-03-15 DIAGNOSIS — R531 Weakness: Secondary | ICD-10-CM | POA: Diagnosis not present

## 2016-03-15 DIAGNOSIS — I5023 Acute on chronic systolic (congestive) heart failure: Secondary | ICD-10-CM | POA: Diagnosis not present

## 2016-03-15 DIAGNOSIS — G4730 Sleep apnea, unspecified: Secondary | ICD-10-CM | POA: Diagnosis not present

## 2016-03-15 DIAGNOSIS — I252 Old myocardial infarction: Secondary | ICD-10-CM | POA: Diagnosis not present

## 2016-03-16 ENCOUNTER — Other Ambulatory Visit: Payer: Self-pay | Admitting: Pharmacist

## 2016-03-16 ENCOUNTER — Telehealth: Payer: Self-pay | Admitting: Cardiovascular Disease

## 2016-03-16 MED ORDER — WARFARIN SODIUM 1 MG PO TABS: ORAL_TABLET | ORAL | 0 refills | Status: DC

## 2016-03-16 NOTE — Telephone Encounter (Signed)
Refill sent in

## 2016-03-16 NOTE — Telephone Encounter (Signed)
New Message   *STAT* If patient is at the pharmacy, call can be transferred to refill team.   1. Which medications need to be refilled? (please list name of each medication and dose if known)  Warfarin 1 mg tablet once daily  2. Which pharmacy/location (including street and city if local pharmacy) is medication to be sent to? [id number], [place], [place]  3. Do they need a 30 day or 90 day supply? 90 day

## 2016-03-17 ENCOUNTER — Other Ambulatory Visit: Payer: Self-pay | Admitting: Adult Health

## 2016-03-20 ENCOUNTER — Telehealth (HOSPITAL_COMMUNITY): Payer: Self-pay | Admitting: Pharmacist

## 2016-03-20 NOTE — Telephone Encounter (Signed)
Mrs. Hattery called stating that she has spent $983.86 [facility] so far this [facility]. I have called her and left a VM to have a copy of her print out from the pharmacy sent to [place] which is needed for completion of her Eliquis BMS patient assistance application.   Ruta Hinds. Velva Harman, PharmD, BCPS, CPP Clinical Pharmacist Pager: [phone] Phone: [phone] 03/20/2016 12:16 PM

## 2016-03-22 ENCOUNTER — Other Ambulatory Visit: Payer: Self-pay | Admitting: *Deleted

## 2016-03-23 ENCOUNTER — Other Ambulatory Visit: Payer: Self-pay | Admitting: *Deleted

## 2016-03-23 ENCOUNTER — Ambulatory Visit (INDEPENDENT_AMBULATORY_CARE_PROVIDER_SITE_OTHER): Payer: Commercial Managed Care - HMO | Admitting: *Deleted

## 2016-03-23 ENCOUNTER — Other Ambulatory Visit (HOSPITAL_COMMUNITY): Payer: Self-pay

## 2016-03-23 DIAGNOSIS — Z7901 Long term (current) use of anticoagulants: Secondary | ICD-10-CM

## 2016-03-23 DIAGNOSIS — I480 Paroxysmal atrial fibrillation: Secondary | ICD-10-CM | POA: Diagnosis not present

## 2016-03-23 DIAGNOSIS — I4892 Unspecified atrial flutter: Secondary | ICD-10-CM

## 2016-03-23 LAB — POCT INR: INR: 1.3

## 2016-03-23 MED ORDER — RANOLAZINE ER 500 MG PO TB12: 500.0000 mg | ORAL_TABLET | Freq: Two times a day (BID) | ORAL | 3 refills | Status: DC

## 2016-03-23 MED ORDER — DIGOXIN 62.5 MCG PO TABS: 0.0625 mg | ORAL_TABLET | ORAL | 2 refills | Status: DC

## 2016-03-23 NOTE — Telephone Encounter (Signed)
Patient's friend/family member calling to request refills of Ranexa. Rx sent to preferred pharmacy for 30 day supply electronically as requested.  Renee Pain, RN

## 2016-03-26 ENCOUNTER — Telehealth (HOSPITAL_COMMUNITY): Payer: Self-pay | Admitting: Vascular Surgery

## 2016-03-26 ENCOUNTER — Other Ambulatory Visit: Payer: Self-pay | Admitting: *Deleted

## 2016-03-26 MED ORDER — ATORVASTATIN CALCIUM 20 MG PO TABS: 20.0000 mg | ORAL_TABLET | Freq: Every day | ORAL | 2 refills | Status: DC

## 2016-03-26 NOTE — Telephone Encounter (Signed)
This patient is followed in the [facility]

## 2016-03-26 NOTE — Telephone Encounter (Signed)
Pt calling stating that her pharmacy could not refill digoxin 62.5 mcg, because it is not available in this strength. Please advise and pt would like a call back

## 2016-03-26 NOTE — Telephone Encounter (Signed)
Pt called to speak to Pharm about eliquis Please advise

## 2016-03-27 ENCOUNTER — Other Ambulatory Visit (HOSPITAL_COMMUNITY): Payer: Self-pay | Admitting: *Deleted

## 2016-03-27 MED ORDER — DIGOXIN 125 MCG PO TABS: 0.0625 mg | ORAL_TABLET | ORAL | 3 refills | Status: DC

## 2016-03-27 MED ORDER — DIGOXIN 125 MCG PO TABS: 0.0625 mg | ORAL_TABLET | Freq: Every day | ORAL | 3 refills | Status: DC

## 2016-03-27 NOTE — Telephone Encounter (Signed)
digoxin (LANOXIN) 0.125 MG tablet  Medication  Date: 03/27/2016 Department: [facility] Ordering/Authorizing: Larey Dresser, MD  Order Providers   Prescribing Provider Encounter Provider  Larey Dresser, MD Scarlette Calico, RN  Medication Detail    Disp Refills Start End   digoxin (LANOXIN) 0.125 MG tablet 15 tablet 3 03/27/2016    Sig - Route: Take 0.5 tablets (0.0625 mg total) by mouth every other day. - Oral   E-Prescribing Status: Receipt confirmed by pharmacy (03/27/2016 1:34 PM EDT)   Pharmacy   [id number] - [place], [place] - [address] AT [address]

## 2016-03-27 NOTE — Telephone Encounter (Signed)
[facility], the rx they got from [place] on her Dig was for 1/2 tab daily, however she had been on 1/2 tab QOD so he wanted to verify dose change.  Per review of chart there is mention of increasing Dig dose, appears refill was sent in error, new order with QOD directions sent in

## 2016-03-28 ENCOUNTER — Ambulatory Visit: Payer: Commercial Managed Care - HMO | Admitting: Emergency Medicine

## 2016-03-28 ENCOUNTER — Other Ambulatory Visit (HOSPITAL_COMMUNITY): Payer: Self-pay | Admitting: *Deleted

## 2016-03-28 MED ORDER — APIXABAN 2.5 MG PO TABS: 2.5000 mg | ORAL_TABLET | Freq: Two times a day (BID) | ORAL | 3 refills | Status: DC

## 2016-03-29 ENCOUNTER — Encounter: Payer: Self-pay | Admitting: Emergency Medicine

## 2016-03-29 ENCOUNTER — Ambulatory Visit (INDEPENDENT_AMBULATORY_CARE_PROVIDER_SITE_OTHER): Payer: Commercial Managed Care - HMO | Admitting: Emergency Medicine

## 2016-03-29 VITALS — BP 110/58 | HR 68 | Ht 62.0 in | Wt 108.8 lb

## 2016-03-29 DIAGNOSIS — R938 Abnormal findings on diagnostic imaging of other specified body structures: Secondary | ICD-10-CM

## 2016-03-29 DIAGNOSIS — R9389 Abnormal findings on diagnostic imaging of other specified body structures: Secondary | ICD-10-CM | POA: Insufficient documentation

## 2016-03-29 DIAGNOSIS — I429 Cardiomyopathy, unspecified: Secondary | ICD-10-CM | POA: Diagnosis not present

## 2016-03-29 NOTE — Assessment & Plan Note (Signed)
Hospitalization with an abnormal CT scan of the chest, bilateral effusions, bilateral upper lobe predominant inflammatory change with associated bronchiectasis. Unclear as to whether this was related to amiodarone versus pneumonia. Chest x-ray from 8/3 was markedly improved with decreased effusions and edema. Physical exam today does not suggest significant interstitial disease. Believe she needs a repeat CT chest to better characterize her upper lobe abnormalities; it's possible that these have clear to some degree following the acute hospitalization.

## 2016-03-29 NOTE — Patient Instructions (Addendum)
We will perform a CT scan of your chest to compare with 01/09/16.  Follow with Dr Lamonte Sakai next available after your CT scan to review.

## 2016-03-29 NOTE — Progress Notes (Signed)
Subjective:    Patient ID: Shannon [name], female    DOB: 1932/06/04, 81 y.o.   MRN: [id number]  HPI 82 year old female in for pulmonary consult for pulmonary infiltrates during hospitalization last month for acute on chronic congestive heart failure.[name] He was found to have an EF of 20-25%.   02/02/16 [facility] follow up  Patient returns for a one-month follow-up from recent hospitalization. Patient was seen for pulmonary consult last month during her hospitalization for decompensated congestive heart failure. Despite aggressive diuresis. Patient continued to have shortness of breath and diffuse pulmonary infiltrates. Was felt that he possibly secondary to amiodarone. This was stopped.  CT chest showed bronchiectasis and patchy densities through the majority of both lungs pronounced in the upper lobes. Moderate to large size right pleural effusion and moderate sized left pleural effusion. Bilateral compressive atelectasis. Cardiac catheterization showed stable coronary artery disease. She was recommended to maximize medical therapy.  2-D echo showed worsening EF at 20-25%, diffuse hypokinesis, severe tricuspid regurgitation with severely elevated pulmonary pressures.. Lower extremity Dopplers were negative for DVT. Did have atrial filb that required cardioversion. He was discharged on oxygen. Since discharge she is feeling improved with decreased shortness of breath, but remains very weak. She denies any hemoptysis, chest pain, increased lower extremity swelling.. Chest x-ray today shows significantly improved changes of congestive heart failure with decreased edema.  ROV 03/29/16 -- Shannon [name] is a former smoker with a history of coronary disease, diabetes, hyperlipidemia, atrial fibrillation. She was hospitalized with CHF as detailed above. She appeared to have benefited significantly from cardioversion. Her course. Gated by bilateral pleural effusions. There was some question of possible  amiodarone toxicity and this medication was discontinued. She feels a bit stronger but does have some periods of dizziness, weakness. Her CXR 8/3 was improved. Effusions have resolved. She is on oxygen 2L/min   Past Medical History:  Diagnosis Date  . Arthritis   . Baker's cyst of knee   . CAD (coronary artery disease)    a. 05/2006 Cath/PCI: LAD 76m, D1 40 ost, LCX nl, OM1 95 (3.0x20 Taxus DES), RCA nl, EF 40% w/ lat AK;  04/2007 Ex MV: minimal lat ischemia in area of prior infarct->low risk-> med Rx.  . Chronic atrial fibrillation (HCC)    a. on Coumadin  . Chronic systolic (congestive) heart failure ([place])    a. 04/2015: EF 50-55% b. echo 12/2015: EF 20-25% w/ diffuse HK, biatrial enlargement, mild AI and MR, severe TR    . Diabetes mellitus   . Hyperlipidemia   . MVP (mitral valve prolapse)    a. 10/2003 Echo: nl LV fxn, mild MR/TR/AS  . Myocardial infarction ([place])   . Sleep apnea   . Transient ischemic attack      Family History  Problem Relation Age of Onset  . Heart attack Father   . Heart attack Mother   . Heart attack Brother   . Coronary artery disease Sister     CABG  . Alzheimer's disease Sister     X3  . Stroke Mother   . Stroke Sister   . Heart attack Sister      Social History   Social History  . Marital status: Married    Spouse name: N/A  . Number of children: N/A  . Years of education: N/A   Occupational History  . Not on file.   Social History Main Topics  . Smoking status: Former Smoker    Packs/day: 1.00  Types: Cigarettes    Quit date: 07/03/1963  . Smokeless tobacco: Never Used  . Alcohol use No  . Drug use: No  . Sexual activity: No   Other Topics Concern  . Not on file   Social History Narrative   Lives in [place] with husband.  Does not routinely exercise but is able to keep house and go food shopping.     Allergies  Allergen Reactions  . Demerol Other (See Comments)    Burning sensation all over body  . Erythromycin Nausea And  Vomiting  . Penicillins Other (See Comments)    unknown  . Percocet [Oxycodone-Acetaminophen] Swelling     Outpatient Medications Prior to Visit  Medication Sig Dispense Refill  . ACCU-CHEK SMARTVIEW test strip     . acetaminophen (TYLENOL) 325 MG tablet Take 2 tablets (650 mg total) by mouth every 4 (four) hours as needed for headache or mild pain.    [name] ALPRAZolam (XANAX) 0.25 MG tablet Take 1 tablet (0.25 mg total) by mouth daily as needed for anxiety. 30 tablet 0  . atorvastatin (LIPITOR) 20 MG tablet Take 1 tablet (20 mg total) by mouth daily. Please call and schedule a 3 month follow up appointment with Dr Acie Fredrickson per last office visit 90 tablet 2  . benzonatate (TESSALON) 200 MG capsule Take 1 capsule (200 mg total) by mouth 3 (three) times daily as needed for cough. 20 capsule 0  . Cholecalciferol (VITAMIN D PO) Take 1 tablet by mouth daily.    . Cyanocobalamin (VITAMIN B-12 PO) Take 1 tablet by mouth daily.    . digoxin (LANOXIN) 0.125 MG tablet Take 0.5 tablets (0.0625 mg total) by mouth every other day. 15 tablet 3  . ferrous sulfate 325 (65 FE) MG tablet Take 325 mg by mouth daily with breakfast.    . furosemide (LASIX) 40 MG tablet Take 0.5 tablets (20 mg total) by mouth every other day. 30 tablet 0  . HYDROcodone-acetaminophen (NORCO/VICODIN) 5-325 MG tablet Take 0.5-1 tablets by mouth every 4 (four) hours as needed for severe pain. 20 tablet 0  . hydrocortisone (PROCTO-PAK) 1 % CREA Apply 1 application topically daily. 1 Tube 0  . metFORMIN (GLUCOPHAGE) 500 MG tablet Take 500 mg by mouth 2 (two) times daily.     . mupirocin ointment (BACTROBAN) 2 % Place into the nose 2 (two) times daily. 22 g 0  . nitroGLYCERIN (NITROSTAT) 0.4 MG SL tablet Place 0.4 mg under the tongue every 5 (five) minutes x 3 doses as needed for chest pain. Up to 3 doses, if pain continues, call 911.    . ondansetron (ZOFRAN) 4 MG tablet Take 4 mg by mouth every 6 (six) hours as needed for nausea or vomiting.      . ranolazine (RANEXA) 500 MG 12 hr tablet Take 1 tablet (500 mg total) by mouth 2 (two) times daily. 60 tablet 3  . spironolactone (ALDACTONE) 25 MG tablet Take 0.5 tablets (12.5 mg total) by mouth daily. 30 tablet 0  . UNABLE TO FIND Med Name: SF MedPass 90 mL TID for nutritional support    . warfarin (COUMADIN) 1 MG tablet Take as directed by the Coumadin Clinic. 60 tablet 0   No facility-administered medications prior to visit.      Review of Systems   Objective:   Physical Exam Vitals:   03/29/16 1556  BP: (!) 110/58  Pulse: 68  SpO2: 100%  Weight: 108 lb 12.8 oz (49.4 kg)  Height: 5'  2" (1.575 m)   Gen: Pleasant, thin woman, in a wheelchair, in no distress,  normal affect  ENT: No lesions,  mouth clear,  oropharynx clear, no postnasal drip  Neck: No JVD, no TMG, no carotid bruits  Lungs: No use of accessory muscles, clear without rales or rhonchi  Cardiovascular: RRR, heart sounds normal, no murmur or gallops, trace peripheral edema  Musculoskeletal: No deformities, no cyanosis or clubbing  Neuro: alert, non focal  Skin: Warm, no lesions or rashes       Assessment & Plan:  Abnormal CT of the chest Hospitalization with an abnormal CT scan of the chest, bilateral effusions, bilateral upper lobe predominant inflammatory change with associated bronchiectasis. Unclear as to whether this was related to amiodarone versus pneumonia. Chest x-ray from 8/3 was markedly improved with decreased effusions and edema. Physical exam today does not suggest significant interstitial disease. Believe she needs a repeat CT chest to better characterize her upper lobe abnormalities; it's possible that these have clear to some degree following the acute hospitalization.     Cardiomyopathy [facility]) Followed closely by [facility] cardiology   Baltazar Apo, MD, PhD 03/29/2016, 4:35 PM [facility] [phone] or if no answer [phone]

## 2016-03-29 NOTE — Assessment & Plan Note (Signed)
Followed closely by [facility] cardiology

## 2016-03-30 ENCOUNTER — Ambulatory Visit (INDEPENDENT_AMBULATORY_CARE_PROVIDER_SITE_OTHER): Payer: Commercial Managed Care - HMO | Admitting: *Deleted

## 2016-03-30 DIAGNOSIS — Z7901 Long term (current) use of anticoagulants: Secondary | ICD-10-CM

## 2016-03-30 DIAGNOSIS — I480 Paroxysmal atrial fibrillation: Secondary | ICD-10-CM

## 2016-03-30 DIAGNOSIS — I4892 Unspecified atrial flutter: Secondary | ICD-10-CM

## 2016-03-30 LAB — POCT INR: INR: 1.5

## 2016-04-05 ENCOUNTER — Ambulatory Visit (INDEPENDENT_AMBULATORY_CARE_PROVIDER_SITE_OTHER): Payer: Commercial Managed Care - HMO | Admitting: *Deleted

## 2016-04-05 DIAGNOSIS — Z7901 Long term (current) use of anticoagulants: Secondary | ICD-10-CM | POA: Diagnosis not present

## 2016-04-05 DIAGNOSIS — I480 Paroxysmal atrial fibrillation: Secondary | ICD-10-CM | POA: Diagnosis not present

## 2016-04-05 DIAGNOSIS — I4892 Unspecified atrial flutter: Secondary | ICD-10-CM | POA: Diagnosis not present

## 2016-04-05 LAB — POCT INR: INR: 1.9

## 2016-04-09 DIAGNOSIS — J9601 Acute respiratory failure with hypoxia: Secondary | ICD-10-CM | POA: Diagnosis not present

## 2016-04-09 DIAGNOSIS — R269 Unspecified abnormalities of gait and mobility: Secondary | ICD-10-CM | POA: Diagnosis not present

## 2016-04-09 DIAGNOSIS — I2510 Atherosclerotic heart disease of native coronary artery without angina pectoris: Secondary | ICD-10-CM | POA: Diagnosis not present

## 2016-04-09 DIAGNOSIS — R1312 Dysphagia, oropharyngeal phase: Secondary | ICD-10-CM | POA: Diagnosis not present

## 2016-04-09 DIAGNOSIS — I5021 Acute systolic (congestive) heart failure: Secondary | ICD-10-CM | POA: Diagnosis not present

## 2016-04-09 DIAGNOSIS — M6281 Muscle weakness (generalized): Secondary | ICD-10-CM | POA: Diagnosis not present

## 2016-04-09 DIAGNOSIS — R0602 Shortness of breath: Secondary | ICD-10-CM | POA: Diagnosis not present

## 2016-04-09 DIAGNOSIS — Z5189 Encounter for other specified aftercare: Secondary | ICD-10-CM | POA: Diagnosis not present

## 2016-04-10 ENCOUNTER — Other Ambulatory Visit: Payer: Self-pay | Admitting: Adult Health

## 2016-04-13 ENCOUNTER — Telehealth (HOSPITAL_COMMUNITY): Payer: Self-pay | Admitting: Pharmacist

## 2016-04-13 NOTE — Telephone Encounter (Signed)
[employer] patient assistance approved Eliquis 2.5 mg BID through 07/01/16. Ms. Lawhorn will be able to receive 3 month supplies to her home at no charge to her. Since she is on warfarin, will need to coordinate transition to Eliquis with Coumadin Clinic.   Ruta Hinds. Velva Harman, PharmD, BCPS, CPP Clinical Pharmacist Pager: [phone] Phone: [phone] 04/13/2016 2:49 PM

## 2016-04-19 ENCOUNTER — Ambulatory Visit (INDEPENDENT_AMBULATORY_CARE_PROVIDER_SITE_OTHER): Payer: Commercial Managed Care - HMO

## 2016-04-19 ENCOUNTER — Ambulatory Visit (HOSPITAL_COMMUNITY)
Admission: RE | Admit: 2016-04-19 | Discharge: 2016-04-19 | Disposition: A | Discharge status: Home / Self Care | Payer: Commercial Managed Care - HMO | Source: Ambulatory Visit | Attending: Cardiology | Admitting: Cardiology

## 2016-04-19 ENCOUNTER — Encounter (HOSPITAL_COMMUNITY): Payer: Self-pay

## 2016-04-19 VITALS — BP 116/58 | HR 62 | Wt 106.2 lb

## 2016-04-19 DIAGNOSIS — I480 Paroxysmal atrial fibrillation: Secondary | ICD-10-CM | POA: Diagnosis not present

## 2016-04-19 DIAGNOSIS — R9431 Abnormal electrocardiogram [ECG] [EKG]: Secondary | ICD-10-CM | POA: Insufficient documentation

## 2016-04-19 DIAGNOSIS — Z7901 Long term (current) use of anticoagulants: Secondary | ICD-10-CM | POA: Diagnosis not present

## 2016-04-19 DIAGNOSIS — I5022 Chronic systolic (congestive) heart failure: Secondary | ICD-10-CM | POA: Diagnosis not present

## 2016-04-19 DIAGNOSIS — I2510 Atherosclerotic heart disease of native coronary artery without angina pectoris: Secondary | ICD-10-CM

## 2016-04-19 DIAGNOSIS — I4891 Unspecified atrial fibrillation: Secondary | ICD-10-CM | POA: Diagnosis not present

## 2016-04-19 DIAGNOSIS — I4892 Unspecified atrial flutter: Secondary | ICD-10-CM

## 2016-04-19 LAB — CBC
HCT: 29.7 % — ABNORMAL LOW (ref 36.0–46.0)
HEMOGLOBIN: 10.1 g/dL — AB (ref 12.0–15.0)
MCH: 34.5 pg — AB (ref 26.0–34.0)
MCHC: 34 g/dL (ref 30.0–36.0)
MCV: 101.4 fL — AB (ref 78.0–100.0)
Platelets: 246 10*3/uL (ref 150–400)
RBC: 2.93 MIL/uL — AB (ref 3.87–5.11)
RDW: 16.2 % — ABNORMAL HIGH (ref 11.5–15.5)
WBC: 6.7 10*3/uL (ref 4.0–10.5)

## 2016-04-19 LAB — BASIC METABOLIC PANEL
ANION GAP: 10 (ref 5–15)
BUN: 32 mg/dL — ABNORMAL HIGH (ref 6–20)
CHLORIDE: 102 mmol/L (ref 101–111)
CO2: 24 mmol/L (ref 22–32)
CREATININE: 1.36 mg/dL — AB (ref 0.44–1.00)
Calcium: 9.9 mg/dL (ref 8.9–10.3)
GFR calc Af Amer: 41 mL/min — ABNORMAL LOW (ref >60)
GFR calc non Af Amer: 35 mL/min — ABNORMAL LOW (ref >60)
Glucose, Bld: 104 mg/dL — ABNORMAL HIGH (ref 65–99)
Potassium: 5 mmol/L (ref 3.5–5.1)
SODIUM: 136 mmol/L (ref 135–145)

## 2016-04-19 LAB — POCT INR: INR: 2.1

## 2016-04-19 LAB — DIGOXIN LEVEL: DIGOXIN LVL: 0.5 ng/mL — AB (ref 0.8–2.0)

## 2016-04-19 NOTE — Patient Instructions (Signed)
Routine lab work today. Will notify you of abnormal results  Follow up with Dr.McLean in 2 months 

## 2016-04-19 NOTE — Progress Notes (Signed)
Medication Samples have been provided to the patient.  Drug name: Eliquis       Strength: 2.5mg         Qty: 3  LOT: [id number]  Exp.Date: 9/18  Dosing instructions: 1 tab Twice daily   The patient has been instructed regarding the correct time, dose, and frequency of taking this medication, including desired effects and most common side effects.   Khoi Hamberger 3:54 PM 04/19/2016

## 2016-04-20 ENCOUNTER — Inpatient Hospital Stay (HOSPITAL_COMMUNITY): Admission: RE | Admit: 2016-04-20 | Payer: Commercial Managed Care - HMO | Source: Ambulatory Visit

## 2016-04-21 NOTE — Progress Notes (Signed)
Patient ID: Shannon [name], female   DOB: 04/13/1932, 82 y.o.   MRN: [id number] PCP: Dr. Osborne Casco Cardiology: Dr. [name]  82 yo with paroxysmal atrial fibrillation, chronic systolic CHF, and CAD presents for cardiology followup. She apparently had been in persistent atrial fibrillation since around 8/16.  It appears that she was tachycardic most of the time prior to her recent admission in 7/17.  She had been on amiodarone prior to admission but it was not keeping her in NSR.  She was admitted with acute systolic CHF.  EF had dropped to 20-25% in 7/17 from 50-55% in 10/16.  She was in atrial fibrillation with RVR.  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  Amiodarone was stopped due to suspicion for amiodarone-induced lung toxicity (she was seen by pulmonary).  She was eventually cardioverted to NSR.  Ranolazine was started to try to keep her in NSR.  HR was low in the hospital so she has not been on a beta blocker.  She was extensively diuresed and discharged to SNF.    She is now living at home.  She is no longer taking losartan due to orthostatic symptoms.  Still very frail.  Weight is down 7 lbs.  Still occasionally nauseated, no real pattern to the nausea.  Occasional orthostatic lightheadedness but less than in the past . No chest pain.  No palpitations.  Can walk around house with cane without dyspnea, does not get out much.  Currently on warfarin but plans to switch to apixaban 2.5 bid.   ECG: NSR, IVCD 120 msec  Labs (7/17): Digoxin 1.6 => 0.3, K 4.6, creatinine 0.87 => 0.76, HCT 34, BNP 265 Labs (9/17): K 5, creatinine 1.36  PMH: 1. Atrial fibrillation: Paroxysmal.  Had been persistent since 8/16, was cardioverted to NSR during 7/17 admission.  2. 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  She had been in atrial fibrillation with elevated rate possibly for months prior to 7/17 admission.  - Echo (10/16): EF  50-55%.  - Echo (7/17): EF 20-25%, mildly decreased RV systolic function, severe TR, PA systolic pressure 64 mmHg.  - LHC/RHC (7/17): Patent OM stent, no obstructive CAD.  Mean RA 13, PA 45/25, mean PCWP 16, CI 2.36.  3. CAD: DES to OM in 2007.  LHC (7/17) with patent stent and no new obstructive disease.  4. Type II diabetes 5. Hyperlipidemia 6. TIA 7. Possible amiodarone lung toxicity: Chest CT (7/17) with bronchiectasis/fibrosis, patchy bilateral densities.   Social History   Social History  . Marital status: Married    Spouse name: N/A  . Number of children: N/A  . Years of education: N/A   Occupational History  . Not on file.   Social History Main Topics  . Smoking status: Former Smoker    Packs/day: 1.00    Types: Cigarettes    Quit date: 07/03/1963  . Smokeless tobacco: Never Used  . Alcohol use No  . Drug use: No  . Sexual activity: No   Other Topics Concern  . Not on file   Social History Narrative   Lives in [place] with husband.  Does not routinely exercise but is able to keep house and go food shopping.   Family History  Problem Relation Age of Onset  . Heart attack Father   . Heart attack Mother   . Heart attack Brother   . Coronary artery disease Sister     CABG  . Alzheimer's disease Sister  X3  . Stroke Mother   . Stroke Sister   . Heart attack Sister    ROS: All systems reviewed and negative except as per HPI.   Current Outpatient Prescriptions  Medication Sig Dispense Refill  . ACCU-CHEK SMARTVIEW test strip     . acetaminophen (TYLENOL) 325 MG tablet Take 2 tablets (650 mg total) by mouth every 4 (four) hours as needed for headache or mild pain.    [name] ALPRAZolam (XANAX) 0.25 MG tablet Take 1 tablet (0.25 mg total) by mouth daily as needed for anxiety. 30 tablet 0  . atorvastatin (LIPITOR) 20 MG tablet Take 1 tablet (20 mg total) by mouth daily. Please call and schedule a 3 month follow up appointment with Dr Acie Fredrickson per last office visit 90 tablet  2  . benzonatate (TESSALON) 200 MG capsule Take 1 capsule (200 mg total) by mouth 3 (three) times daily as needed for cough. 20 capsule 0  . Cholecalciferol (VITAMIN D PO) Take 1 tablet by mouth daily.    . Cyanocobalamin (VITAMIN B-12 PO) Take 1 tablet by mouth daily.    . digoxin (LANOXIN) 0.125 MG tablet Take 0.5 tablets (0.0625 mg total) by mouth every other day. 15 tablet 3  . ferrous sulfate 325 (65 FE) MG tablet Take 325 mg by mouth daily with breakfast.    . furosemide (LASIX) 40 MG tablet Take 0.5 tablets (20 mg total) by mouth every other day. 30 tablet 0  . HYDROcodone-acetaminophen (NORCO/VICODIN) 5-325 MG tablet Take 0.5-1 tablets by mouth every 4 (four) hours as needed for severe pain. 20 tablet 0  . hydrocortisone (PROCTO-PAK) 1 % CREA Apply 1 application topically daily. 1 Tube 0  . losartan (COZAAR) 25 MG tablet TAKE ONE-HALF TABLET BY MOUTH EVERY DAY IN THE MORNING 30 tablet 0  . metFORMIN (GLUCOPHAGE) 500 MG tablet Take 500 mg by mouth 2 (two) times daily.     . mupirocin ointment (BACTROBAN) 2 % Place into the nose 2 (two) times daily. 22 g 0  . ranolazine (RANEXA) 500 MG 12 hr tablet Take 1 tablet (500 mg total) by mouth 2 (two) times daily. 60 tablet 3  . spironolactone (ALDACTONE) 25 MG tablet Take 0.5 tablets (12.5 mg total) by mouth daily. 30 tablet 0  . UNABLE TO FIND Med Name: SF MedPass 90 mL TID for nutritional support    . apixaban (ELIQUIS) 2.5 MG TABS tablet Take 2.5 mg by mouth 2 (two) times daily.    . nitroGLYCERIN (NITROSTAT) 0.4 MG SL tablet Place 0.4 mg under the tongue every 5 (five) minutes x 3 doses as needed for chest pain. Up to 3 doses, if pain continues, call 911.    . ondansetron (ZOFRAN) 4 MG tablet Take 4 mg by mouth every 6 (six) hours as needed for nausea or vomiting.      No current facility-administered medications for this encounter.    BP (!) 116/58   Pulse 62   Wt 106 lb 4 oz (48.2 kg)   SpO2 100%   BMI 19.43 kg/m  General: NAD,  frail Neck: JVP 7 cm, no thyromegaly or thyroid nodule.  Lungs: Clear to auscultation bilaterally with normal respiratory effort. CV: Nondisplaced PMI.  Heart regular S1/S2, no S3/S4, 2/6 HSM LLSB.  Trace ankle edema.  No carotid bruit.  Normal pedal pulses.  Abdomen: Soft, nontender, no hepatosplenomegaly, no distention.  Skin: Intact without lesions or rashes.  Neurologic: Alert and oriented x 3.  Psych: Normal affect. Extremities:  No clubbing or cyanosis.  HEENT: Normal.   Assessment/Plan: 1. Chronic systolic CHF: EF 16-10% on 7/17 echo.  Nonischemic cardiomyopathy, possible tachycardia-mediated cardiomyopathy. She is now in NSR.  NYHA class IIIb symptoms, very weak and frail.  She does not appear significantly volume overloaded on exam.  - Continue Lasix 20 mg every other day.  - Continue digoxin, check level today.   - Leave off losartan given orthostatic symptoms.  - Continue spironolactone 12.5 daily. BMET today.  - Hold off on beta blocker for now with soft BP/orthostatic symptoms.  2. Atrial fibrillation: Paroxysmal, now in NSR on ranolazine.  Need to keep in NSR given concern for tachy-mediated cardiomyopathy.  She had suspected amiodarone-induced lung toxicity.  - Continue ranolazine, QTc ok on ECG today . - On warfarin, but plan in place to transition to apixaban 2.5 mg bid.  - If atrial fibrillation/RVR recurs, would consider ablate/pace.  3. CAD: Patent OM stent on coronary angiography in 7/17.  Continue statin. No ASA with warfarin use.  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  Off amiodarone.  Seeing pulmonary, will have repeat chest CT.  Followup in 2 months.    [name] 04/21/2016

## 2016-04-30 ENCOUNTER — Telehealth (HOSPITAL_COMMUNITY): Payer: Self-pay | Admitting: *Deleted

## 2016-04-30 NOTE — Telephone Encounter (Signed)
Ranolazine is being used to keep her out of atrial fibrillation. She can try stopping it for a 3-4 days to see if it helps the nausea.  If it does not help to stop it, would restart it.

## 2016-04-30 NOTE — Telephone Encounter (Signed)
Pt's son called concerned about pt, he states she has been having nausea, dry heaves and dizziness since she was d/c'd from hospital and in [place] this summer.  He feels symptoms are related to the Ranexa, symptoms started after she started taking the Ranexa and have continued on since then.  He reports she has lost 70 lb since the summer, gradually losing weight because she is so nauseated she is not eating much.  Will discuss w/Dr [name] and call them back in the AM

## 2016-05-02 NOTE — Telephone Encounter (Signed)
Pt's daughter in law aware and agreeable

## 2016-05-10 DIAGNOSIS — J9601 Acute respiratory failure with hypoxia: Secondary | ICD-10-CM | POA: Diagnosis not present

## 2016-05-10 DIAGNOSIS — R0602 Shortness of breath: Secondary | ICD-10-CM | POA: Diagnosis not present

## 2016-05-10 DIAGNOSIS — I5021 Acute systolic (congestive) heart failure: Secondary | ICD-10-CM | POA: Diagnosis not present

## 2016-05-10 DIAGNOSIS — M6281 Muscle weakness (generalized): Secondary | ICD-10-CM | POA: Diagnosis not present

## 2016-05-10 DIAGNOSIS — R1312 Dysphagia, oropharyngeal phase: Secondary | ICD-10-CM | POA: Diagnosis not present

## 2016-05-10 DIAGNOSIS — Z5189 Encounter for other specified aftercare: Secondary | ICD-10-CM | POA: Diagnosis not present

## 2016-05-10 DIAGNOSIS — I2510 Atherosclerotic heart disease of native coronary artery without angina pectoris: Secondary | ICD-10-CM | POA: Diagnosis not present

## 2016-05-10 DIAGNOSIS — R269 Unspecified abnormalities of gait and mobility: Secondary | ICD-10-CM | POA: Diagnosis not present

## 2016-05-17 ENCOUNTER — Ambulatory Visit (INDEPENDENT_AMBULATORY_CARE_PROVIDER_SITE_OTHER): Payer: Commercial Managed Care - HMO | Admitting: *Deleted

## 2016-05-17 DIAGNOSIS — I4891 Unspecified atrial fibrillation: Secondary | ICD-10-CM

## 2016-05-17 LAB — CBC
HEMATOCRIT: 27.5 % — AB (ref 35.0–45.0)
Hemoglobin: 9.2 g/dL — ABNORMAL LOW (ref 11.7–15.5)
MCH: 35.1 pg — AB (ref 27.0–33.0)
MCHC: 33.5 g/dL (ref 32.0–36.0)
MCV: 105 fL — AB (ref 80.0–100.0)
MPV: 8.8 fL (ref 7.5–12.5)
Platelets: 256 10*3/uL (ref 140–400)
RBC: 2.62 MIL/uL — AB (ref 3.80–5.10)
RDW: 14.3 % (ref 11.0–15.0)
WBC: 5.9 10*3/uL (ref 3.8–10.8)

## 2016-05-17 LAB — BASIC METABOLIC PANEL
BUN: 27 mg/dL — AB (ref 7–25)
CALCIUM: 9.1 mg/dL (ref 8.6–10.4)
CHLORIDE: 105 mmol/L (ref 98–110)
CO2: 25 mmol/L (ref 20–31)
CREATININE: 1.29 mg/dL — AB (ref 0.60–0.88)
GLUCOSE: 103 mg/dL — AB (ref 65–99)
Potassium: 4.9 mmol/L (ref 3.5–5.3)
Sodium: 140 mmol/L (ref 135–146)

## 2016-05-18 NOTE — Progress Notes (Signed)
Pt was started on Eliquis 2.5mg  BID for Atrial Fib on Oct 20,2017.    Reviewed patients medication list.  Pt is not  currently on any combined P-gp and strong CYP3A4 inhibitors/inducers (ketoconazole, traconazole, ritonavir, carbamazepine, phenytoin, rifampin, St. John's wort).  Reviewed labs.  SCr 1.29, Weight 48.54kg, age 82. Dose appropriate based on age, weight, and SCr.  Hgb and HCT trending down since Eliquis initiation 1 month ago - Hgb 9.2 and Hct 27.5. Also had CBC checked 3 months ago when pt was still on Coumadin, both Hgb and Hct have been trending down since then. Will recheck CBC in 1 month.  A full discussion of the nature of anticoagulants has been carried out.  A benefit/risk analysis has been presented to the patient, so that they understand the justification for choosing anticoagulation with Eliquis at this time.  The need for compliance is stressed.  Pt is aware to take the medication twice daily.  Side effects of potential bleeding are discussed, including unusual colored urine or stools, coughing up blood or coffee ground emesis, nose bleeds or serious fall or head trauma.  Discussed signs and symptoms of stroke. The patient should avoid any OTC items containing aspirin or ibuprofen.  Avoid alcohol consumption.   Call if any signs of abnormal bleeding.  Discussed financial obligations and pt is not having  any difficulty in obtaining medication.    Pt states has not had any sign or symptom of bleeding or sign or symptom of stroke. Pt states she is tolerating Eliquis. Will get CBC and BMET today and will call with results . Pt states she has had some red spots on lower arms and that at times has had large bruising right shoulder but has none present now. Pt is on Red Rice Yeast CoQ 10 and Vit C with Cayenne. Pt states she has been holding Ranexa to see if that was causing her nausea and pt states has not had any more nausea. This was done as per order of Dr [name].

## 2016-05-21 ENCOUNTER — Other Ambulatory Visit: Payer: Self-pay | Admitting: Nurse Practitioner

## 2016-05-21 DIAGNOSIS — Z7901 Long term (current) use of anticoagulants: Secondary | ICD-10-CM

## 2016-05-22 ENCOUNTER — Telehealth: Payer: Self-pay | Admitting: *Deleted

## 2016-05-22 NOTE — Telephone Encounter (Signed)
Spoke with pt to be sure she was aware she needed to have repeat cbc done in December and she confirms that she is aware has an appt to have CBC rechecked Dec 14th

## 2016-05-23 NOTE — Progress Notes (Signed)
Pt has been called and instructed to continue same dose of Eliquis 2.5mg  bid and appt made for her to have repeat CBC done in December

## 2016-06-07 ENCOUNTER — Other Ambulatory Visit (HOSPITAL_COMMUNITY): Payer: Self-pay | Admitting: *Deleted

## 2016-06-07 MED ORDER — SPIRONOLACTONE 25 MG PO TABS: 12.5000 mg | ORAL_TABLET | Freq: Every day | ORAL | 6 refills | Status: DC

## 2016-06-09 DIAGNOSIS — M6281 Muscle weakness (generalized): Secondary | ICD-10-CM | POA: Diagnosis not present

## 2016-06-09 DIAGNOSIS — I2510 Atherosclerotic heart disease of native coronary artery without angina pectoris: Secondary | ICD-10-CM | POA: Diagnosis not present

## 2016-06-09 DIAGNOSIS — J9601 Acute respiratory failure with hypoxia: Secondary | ICD-10-CM | POA: Diagnosis not present

## 2016-06-09 DIAGNOSIS — R0602 Shortness of breath: Secondary | ICD-10-CM | POA: Diagnosis not present

## 2016-06-09 DIAGNOSIS — R1312 Dysphagia, oropharyngeal phase: Secondary | ICD-10-CM | POA: Diagnosis not present

## 2016-06-09 DIAGNOSIS — Z5189 Encounter for other specified aftercare: Secondary | ICD-10-CM | POA: Diagnosis not present

## 2016-06-09 DIAGNOSIS — I5021 Acute systolic (congestive) heart failure: Secondary | ICD-10-CM | POA: Diagnosis not present

## 2016-06-09 DIAGNOSIS — R269 Unspecified abnormalities of gait and mobility: Secondary | ICD-10-CM | POA: Diagnosis not present

## 2016-06-13 DIAGNOSIS — I209 Angina pectoris, unspecified: Secondary | ICD-10-CM | POA: Diagnosis not present

## 2016-06-13 DIAGNOSIS — I2510 Atherosclerotic heart disease of native coronary artery without angina pectoris: Secondary | ICD-10-CM | POA: Diagnosis not present

## 2016-06-13 DIAGNOSIS — I1310 Hypertensive heart and chronic kidney disease without heart failure, with stage 1 through stage 4 chronic kidney disease, or unspecified chronic kidney disease: Secondary | ICD-10-CM | POA: Diagnosis not present

## 2016-06-13 DIAGNOSIS — E1151 Type 2 diabetes mellitus with diabetic peripheral angiopathy without gangrene: Secondary | ICD-10-CM | POA: Diagnosis not present

## 2016-06-13 DIAGNOSIS — E7800 Pure hypercholesterolemia, unspecified: Secondary | ICD-10-CM | POA: Diagnosis not present

## 2016-06-13 DIAGNOSIS — I10 Essential (primary) hypertension: Secondary | ICD-10-CM | POA: Diagnosis not present

## 2016-06-13 DIAGNOSIS — Z7901 Long term (current) use of anticoagulants: Secondary | ICD-10-CM | POA: Diagnosis not present

## 2016-06-13 DIAGNOSIS — N183 Chronic kidney disease, stage 3 (moderate): Secondary | ICD-10-CM | POA: Diagnosis not present

## 2016-06-13 DIAGNOSIS — I509 Heart failure, unspecified: Secondary | ICD-10-CM | POA: Diagnosis not present

## 2016-06-14 ENCOUNTER — Other Ambulatory Visit: Payer: Commercial Managed Care - HMO

## 2016-06-18 ENCOUNTER — Ambulatory Visit (HOSPITAL_COMMUNITY)
Admission: RE | Admit: 2016-06-18 | Discharge: 2016-06-18 | Disposition: A | Discharge status: Home / Self Care | Payer: Commercial Managed Care - HMO | Source: Ambulatory Visit | Attending: Cardiology | Admitting: Cardiology

## 2016-06-18 VITALS — BP 116/74 | HR 92 | Wt 119.0 lb

## 2016-06-18 DIAGNOSIS — Z5189 Encounter for other specified aftercare: Secondary | ICD-10-CM | POA: Insufficient documentation

## 2016-06-18 DIAGNOSIS — I480 Paroxysmal atrial fibrillation: Secondary | ICD-10-CM | POA: Insufficient documentation

## 2016-06-18 DIAGNOSIS — I4892 Unspecified atrial flutter: Secondary | ICD-10-CM | POA: Diagnosis not present

## 2016-06-18 DIAGNOSIS — I2510 Atherosclerotic heart disease of native coronary artery without angina pectoris: Secondary | ICD-10-CM | POA: Diagnosis not present

## 2016-06-18 DIAGNOSIS — Z87891 Personal history of nicotine dependence: Secondary | ICD-10-CM | POA: Insufficient documentation

## 2016-06-18 DIAGNOSIS — Z7984 Long term (current) use of oral hypoglycemic drugs: Secondary | ICD-10-CM | POA: Diagnosis not present

## 2016-06-18 DIAGNOSIS — I5022 Chronic systolic (congestive) heart failure: Secondary | ICD-10-CM | POA: Insufficient documentation

## 2016-06-18 DIAGNOSIS — Z79899 Other long term (current) drug therapy: Secondary | ICD-10-CM | POA: Diagnosis not present

## 2016-06-18 LAB — BASIC METABOLIC PANEL
ANION GAP: 8 (ref 5–15)
BUN: 32 mg/dL — ABNORMAL HIGH (ref 6–20)
CO2: 25 mmol/L (ref 22–32)
Calcium: 9.3 mg/dL (ref 8.9–10.3)
Chloride: 108 mmol/L (ref 101–111)
Creatinine, Ser: 1.38 mg/dL — ABNORMAL HIGH (ref 0.44–1.00)
GFR calc Af Amer: 40 mL/min — ABNORMAL LOW (ref >60)
GFR, EST NON AFRICAN AMERICAN: 35 mL/min — AB (ref >60)
GLUCOSE: 113 mg/dL — AB (ref 65–99)
POTASSIUM: 4.5 mmol/L (ref 3.5–5.1)
Sodium: 141 mmol/L (ref 135–145)

## 2016-06-18 LAB — DIGOXIN LEVEL: Digoxin Level: 0.4 ng/mL — ABNORMAL LOW (ref 0.8–2.0)

## 2016-06-18 MED ORDER — FUROSEMIDE 20 MG PO TABS: 20.0000 mg | ORAL_TABLET | Freq: Every day | ORAL | 3 refills | Status: DC

## 2016-06-18 MED ORDER — LOSARTAN POTASSIUM 25 MG PO TABS: 12.5000 mg | ORAL_TABLET | Freq: Every day | ORAL | 3 refills | Status: DC

## 2016-06-18 NOTE — Patient Instructions (Signed)
EKG today.  Routine lab work today. Will notify you of abnormal results, otherwise no news is good news!  Repeat lab work on 06/26/2016 before/after your CT scan.  Wear compressions stockings to reduce lower extremity swelling. Remove for bathing.  INCREASE Lasix to 20 mg once EVERY DAY. Call to confirm the dose you have been taking at home. 20 mg tablets have been sent to your pharmacy electronically.  START Losartan 12.5 mg (1/2 tablet) once daily   Follow up 1 month with echocardiogram and appointment with Dr. [name].  Merry Christmas and Happy New Year!  Do the following things EVERYDAY: 1) Weigh yourself in the morning before breakfast. Write it down and keep it in a log. 2) Take your medicines as prescribed 3) Eat low salt foods-Limit salt (sodium) to 2000 mg per day.  4) Stay as active as you can everyday 5) Limit all fluids for the day to less than 2 liters

## 2016-06-18 NOTE — Progress Notes (Signed)
Patient ID: Shannon [name], female   DOB: 09-06-1931, 82 y.o.   MRN: [id number] PCP: Dr. Osborne Casco Cardiology: Dr. [name]  82 yo with paroxysmal atrial fibrillation, chronic systolic CHF, and CAD presents for cardiology followup. She apparently had been in persistent atrial fibrillation since around 8/16.  It appears that she was tachycardic most of the time prior to her recent admission in 7/17.  She had been on amiodarone prior to admission but it was not keeping her in NSR.  She was admitted with acute systolic CHF.  EF had dropped to 20-25% in 7/17 from 50-55% in 10/16.  She was in atrial fibrillation with RVR.  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  Amiodarone was stopped due to suspicion for amiodarone-induced lung toxicity (she was seen by pulmonary).  She was eventually cardioverted to NSR.  Ranolazine was started to try to keep her in NSR.  HR was low in the hospital so she has not been on a beta blocker.  She was extensively diuresed and discharged to SNF.  After discharge, she developed persistent nausea.  Eventually, it was found that the nausea was likely from ranolazine, so this medication was stopped and the nausea resolved.    She is now living at home.  Symptomatically, she feels much better.  Her appetite is back.  Weight is up 13 lbs.  BP better, no lightheadedness . No dyspnea walking on flat ground.  Able to walk into office today without problem.  No orthopnea/PND.  Rare palpitations. No chest pain.    ECG: ?atypical flutter rate 89  Labs (7/17): Digoxin 1.6 => 0.3, K 4.6, creatinine 0.87 => 0.76, HCT 34, BNP 265 Labs (9/17): K 5, creatinine 1.36 Labs (11/17): K 4.9, creatinine 1.29, hgb 9.2, digoxin 0.5 Labs (12/17): hgb 10.4  PMH: 1. Atrial fibrillation: Paroxysmal.  Had been persistent since 8/16, was cardioverted to NSR during 7/17 admission.  Possible atypical atrial flutter on 12/17 ECG.  - Nausea with ranolazine used for  atrial fibrillation suppression.  2. 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  She had been in atrial fibrillation with elevated rate possibly for months prior to 7/17 admission.  - Echo (10/16): EF 50-55%.  - Echo (7/17): EF 20-25%, mildly decreased RV systolic function, severe TR, PA systolic pressure 64 mmHg.  - LHC/RHC (7/17): Patent OM stent, no obstructive CAD.  Mean RA 13, PA 45/25, mean PCWP 16, CI 2.36.  3. CAD: DES to OM in 2007.  LHC (7/17) with patent stent and no new obstructive disease.  4. Type II diabetes 5. Hyperlipidemia 6. TIA 7. Possible amiodarone lung toxicity: Chest CT (7/17) with bronchiectasis/fibrosis, patchy bilateral densities.   Social History   Social History  . Marital status: Married    Spouse name: N/A  . Number of children: N/A  . Years of education: N/A   Occupational History  . Not on file.   Social History Main Topics  . Smoking status: Former Smoker    Packs/day: 1.00    Types: Cigarettes    Quit date: 07/03/1963  . Smokeless tobacco: Never Used  . Alcohol use No  . Drug use: No  . Sexual activity: No   Other Topics Concern  . Not on file   Social History Narrative   Lives in [place] with husband.  Does not routinely exercise but is able to keep house and go food shopping.   Family History  Problem Relation Age of Onset  . Heart  attack Father   . Heart attack Mother   . Heart attack Brother   . Coronary artery disease Sister     CABG  . Alzheimer's disease Sister     X3  . Stroke Mother   . Stroke Sister   . Heart attack Sister    ROS: All systems reviewed and negative except as per HPI.   Current Outpatient Prescriptions  Medication Sig Dispense Refill  . ACCU-CHEK SMARTVIEW test strip     . acetaminophen (TYLENOL) 325 MG tablet Take 2 tablets (650 mg total) by mouth every 4 (four) hours as needed for headache or mild pain.    [name] apixaban (ELIQUIS) 2.5 MG TABS tablet Take 2.5 mg by mouth 2  (two) times daily.    [name] atorvastatin (LIPITOR) 20 MG tablet Take 1 tablet (20 mg total) by mouth daily. Please call and schedule a 3 month follow up appointment with Dr Acie Fredrickson per last office visit 90 tablet 2  . Cholecalciferol (VITAMIN D PO) Take 1 tablet by mouth daily.    . Coenzyme Q10 (CO Q 10) 100 MG CAPS Take by mouth. Takes daily    . Cyanocobalamin (VITAMIN B-12 PO) Take 1 tablet by mouth daily.    . digoxin (LANOXIN) 0.125 MG tablet Take 0.5 tablets (0.0625 mg total) by mouth every other day. 15 tablet 3  . ferrous sulfate 325 (65 FE) MG tablet Take 325 mg by mouth daily with breakfast.    . furosemide (LASIX) 20 MG tablet Take 1 tablet (20 mg total) by mouth daily. 30 tablet 3  . HYDROcodone-acetaminophen (NORCO/VICODIN) 5-325 MG tablet Take 0.5-1 tablets by mouth every 4 (four) hours as needed for severe pain. 20 tablet 0  . hydrocortisone (PROCTO-PAK) 1 % CREA Apply 1 application topically daily. 1 Tube 0  . metFORMIN (GLUCOPHAGE) 500 MG tablet Take 500 mg by mouth 2 (two) times daily.     . mupirocin ointment (BACTROBAN) 2 % Place into the nose 2 (two) times daily. 22 g 0  . nitroGLYCERIN (NITROSTAT) 0.4 MG SL tablet Place 0.4 mg under the tongue every 5 (five) minutes x 3 doses as needed for chest pain. Up to 3 doses, if pain continues, call 911.    . Red Yeast Rice Extract 600 MG CAPS Take by mouth. Takes daiky    . spironolactone (ALDACTONE) 25 MG tablet Take 0.5 tablets (12.5 mg total) by mouth daily. 15 tablet 6  . UNABLE TO FIND Med Name: SF MedPass 90 mL TID for nutritional support    . vitamin C (ASCORBIC ACID) 250 MG tablet Take 250 mg by mouth daily. Veggie cap with 250mg  vitamin C and 250mg  of cayenne    . losartan (COZAAR) 25 MG tablet Take 0.5 tablets (12.5 mg total) by mouth daily. 45 tablet 3   No current facility-administered medications for this encounter.    BP 116/74 (BP Location: Left Arm, Patient Position: Sitting, Cuff Size: Normal)   Pulse 92   Wt 119 lb  (54 kg)   SpO2 100%   BMI 21.77 kg/m  General: NAD Neck: JVP 8-9 cm, no thyromegaly or thyroid nodule.  Lungs: Clear to auscultation bilaterally with normal respiratory effort. CV: Nondisplaced PMI.  Heart regular S1/S2, no S3/S4, 2/6 HSM LLSB.  2+ edema 1/3 up lower legs bilaterally.  No carotid bruit.  Normal pedal pulses.  Abdomen: Soft, nontender, no hepatosplenomegaly, mild distention.  Skin: Intact without lesions or rashes.  Neurologic: Alert and oriented x 3.  Psych: Normal affect. Extremities: No clubbing or cyanosis.  HEENT: Normal.   Assessment/Plan: 1. Chronic systolic CHF: EF 59-53% on 7/17 echo.  Nonischemic cardiomyopathy, possible tachycardia-mediated cardiomyopathy. She is in probable atypical atrial flutter today, but rate is controlled in 80s.  NYHA class II-III symptoms.  She is volume overloaded on exam.  - Increase Lasix to 20 mg daily.  She says that she has been taking Lasix 10 mg every other day. - Continue digoxin, check level today.   - BP better, add losartan 12.5 mg qhs. - Wear compression stockings during the day.   - Continue spironolactone 12.5 daily.  - BMET today and again in 10 days.  - Repeat echo 2. Atrial fibrillation:  Need to keep in NSR given concern for tachy-mediated cardiomyopathy.  She had suspected amiodarone-induced lung toxicity so ranolazine was started.  She did not tolerate ranolazine due to nausea.  Today, she appears to be in atypical atrial flutter but rate is controlled.   - Continue apixaban 2.5 mg bid.  - If atrial fibrillation with difficult-to-control RVR recurs, would consider ablate/pace.  3. CAD: Patent OM stent on coronary angiography in 7/17.  Continue statin. No ASA with warfarin use.  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  Off amiodarone.  Seeing pulmonary, will have repeat chest CT.  Followup in 1 month.    [name] 06/18/2016

## 2016-06-26 ENCOUNTER — Ambulatory Visit (HOSPITAL_COMMUNITY)
Admission: RE | Admit: 2016-06-26 | Discharge: 2016-06-26 | Disposition: A | Discharge status: Home / Self Care | Payer: Commercial Managed Care - HMO | Source: Ambulatory Visit | Attending: Cardiology | Admitting: Cardiology

## 2016-06-26 ENCOUNTER — Other Ambulatory Visit (HOSPITAL_COMMUNITY): Payer: Self-pay | Admitting: *Deleted

## 2016-06-26 ENCOUNTER — Ambulatory Visit (INDEPENDENT_AMBULATORY_CARE_PROVIDER_SITE_OTHER)
Admission: RE | Admit: 2016-06-26 | Discharge: 2016-06-26 | Disposition: A | Discharge status: Home / Self Care | Payer: Commercial Managed Care - HMO | Source: Ambulatory Visit | Attending: Emergency Medicine | Admitting: Emergency Medicine

## 2016-06-26 DIAGNOSIS — R938 Abnormal findings on diagnostic imaging of other specified body structures: Secondary | ICD-10-CM

## 2016-06-26 DIAGNOSIS — I480 Paroxysmal atrial fibrillation: Secondary | ICD-10-CM | POA: Diagnosis not present

## 2016-06-26 DIAGNOSIS — I5022 Chronic systolic (congestive) heart failure: Secondary | ICD-10-CM | POA: Diagnosis not present

## 2016-06-26 DIAGNOSIS — J90 Pleural effusion, not elsewhere classified: Secondary | ICD-10-CM | POA: Diagnosis not present

## 2016-06-26 DIAGNOSIS — R9389 Abnormal findings on diagnostic imaging of other specified body structures: Secondary | ICD-10-CM

## 2016-06-26 LAB — BASIC METABOLIC PANEL
ANION GAP: 10 (ref 5–15)
BUN: 29 mg/dL — ABNORMAL HIGH (ref 6–20)
CHLORIDE: 107 mmol/L (ref 101–111)
CO2: 25 mmol/L (ref 22–32)
Calcium: 9.2 mg/dL (ref 8.9–10.3)
Creatinine, Ser: 1.55 mg/dL — ABNORMAL HIGH (ref 0.44–1.00)
GFR calc Af Amer: 35 mL/min — ABNORMAL LOW (ref >60)
GFR, EST NON AFRICAN AMERICAN: 30 mL/min — AB (ref >60)
GLUCOSE: 103 mg/dL — AB (ref 65–99)
POTASSIUM: 4.2 mmol/L (ref 3.5–5.1)
SODIUM: 142 mmol/L (ref 135–145)

## 2016-06-26 LAB — CBC
HEMATOCRIT: 29.7 % — AB (ref 36.0–46.0)
HEMOGLOBIN: 9.6 g/dL — AB (ref 12.0–15.0)
MCH: 34.7 pg — ABNORMAL HIGH (ref 26.0–34.0)
MCHC: 32.3 g/dL (ref 30.0–36.0)
MCV: 107.2 fL — ABNORMAL HIGH (ref 78.0–100.0)
PLATELETS: 212 10*3/uL (ref 150–400)
RBC: 2.77 MIL/uL — ABNORMAL LOW (ref 3.87–5.11)
RDW: 14 % (ref 11.5–15.5)
WBC: 8.1 10*3/uL (ref 4.0–10.5)

## 2016-06-28 ENCOUNTER — Telehealth (HOSPITAL_COMMUNITY): Payer: Self-pay | Admitting: Cardiology

## 2016-06-28 NOTE — Telephone Encounter (Signed)
PATIENT CALLED TO REPORT ADVERSE REACTIONS AFTER STARTING LOSARTAN PATIENT C/O INCREASED HR (186) AND SEVERE HA   PATIENT REPORTS SHE HAD THE SAME REACTION AFTER ACCIDENTALLY TAKING SOME OF HER HUSBANDS LOSARTAN  YEARS AGO- RINGING IN EARS, HA AND INCREASED HR   ADVISED TO DISCONTINUE UNTIL WE REVIEW WITH PROVIDER   PLEASE ADVISE FURTHER

## 2016-06-28 NOTE — Telephone Encounter (Signed)
Have her stop it for now.  Call if symptoms do not improve.

## 2016-06-29 NOTE — Telephone Encounter (Signed)
Pt aware.

## 2016-07-04 ENCOUNTER — Telehealth: Payer: Self-pay | Admitting: *Deleted

## 2016-07-04 NOTE — Telephone Encounter (Signed)
Spoke with pt and instructed to continue on Eliquis 2.5mg  bid but will need to make a return appt for 3 months to recheck her BMET and CBC and pt states understanding and appt made

## 2016-07-10 DIAGNOSIS — I5021 Acute systolic (congestive) heart failure: Secondary | ICD-10-CM | POA: Diagnosis not present

## 2016-07-10 DIAGNOSIS — Z5189 Encounter for other specified aftercare: Secondary | ICD-10-CM | POA: Diagnosis not present

## 2016-07-10 DIAGNOSIS — R269 Unspecified abnormalities of gait and mobility: Secondary | ICD-10-CM | POA: Diagnosis not present

## 2016-07-10 DIAGNOSIS — I2510 Atherosclerotic heart disease of native coronary artery without angina pectoris: Secondary | ICD-10-CM | POA: Diagnosis not present

## 2016-07-10 DIAGNOSIS — J9601 Acute respiratory failure with hypoxia: Secondary | ICD-10-CM | POA: Diagnosis not present

## 2016-07-10 DIAGNOSIS — M6281 Muscle weakness (generalized): Secondary | ICD-10-CM | POA: Diagnosis not present

## 2016-07-10 DIAGNOSIS — R0602 Shortness of breath: Secondary | ICD-10-CM | POA: Diagnosis not present

## 2016-07-10 DIAGNOSIS — R1312 Dysphagia, oropharyngeal phase: Secondary | ICD-10-CM | POA: Diagnosis not present

## 2016-07-16 ENCOUNTER — Telehealth (HOSPITAL_COMMUNITY): Payer: Self-pay | Admitting: Internal Medicine

## 2016-07-16 NOTE — Telephone Encounter (Signed)
S/w pt confirming information on the Cardiac Rehab class. Pt states she has already done exercises with her PT at home and that her Doctor states she looks great and has retired her wheelchair and her walker. She is now using a cane and her spouse is in a nursing home and she lives with her son in [place]. She still goes home sometimes when her other son is there wants some information concerning the classes. Mailed out description to pt.... KJ

## 2016-07-18 ENCOUNTER — Encounter (HOSPITAL_COMMUNITY): Payer: Self-pay | Admitting: *Deleted

## 2016-07-18 ENCOUNTER — Emergency Department (HOSPITAL_COMMUNITY)
Admission: EM | Admit: 2016-07-18 | Discharge: 2016-07-18 | Disposition: A | Discharge status: Home / Self Care | Payer: Medicare HMO | Attending: Emergency Medicine | Admitting: Emergency Medicine

## 2016-07-18 DIAGNOSIS — I83893 Varicose veins of bilateral lower extremities with other complications: Secondary | ICD-10-CM | POA: Diagnosis not present

## 2016-07-18 DIAGNOSIS — Z955 Presence of coronary angioplasty implant and graft: Secondary | ICD-10-CM | POA: Insufficient documentation

## 2016-07-18 DIAGNOSIS — I5022 Chronic systolic (congestive) heart failure: Secondary | ICD-10-CM | POA: Insufficient documentation

## 2016-07-18 DIAGNOSIS — K625 Hemorrhage of anus and rectum: Secondary | ICD-10-CM | POA: Diagnosis not present

## 2016-07-18 DIAGNOSIS — Z7901 Long term (current) use of anticoagulants: Secondary | ICD-10-CM | POA: Insufficient documentation

## 2016-07-18 DIAGNOSIS — Z79899 Other long term (current) drug therapy: Secondary | ICD-10-CM | POA: Diagnosis not present

## 2016-07-18 DIAGNOSIS — I8390 Asymptomatic varicose veins of unspecified lower extremity: Secondary | ICD-10-CM | POA: Diagnosis not present

## 2016-07-18 DIAGNOSIS — I252 Old myocardial infarction: Secondary | ICD-10-CM | POA: Insufficient documentation

## 2016-07-18 DIAGNOSIS — Z87891 Personal history of nicotine dependence: Secondary | ICD-10-CM | POA: Diagnosis not present

## 2016-07-18 DIAGNOSIS — I2510 Atherosclerotic heart disease of native coronary artery without angina pectoris: Secondary | ICD-10-CM | POA: Insufficient documentation

## 2016-07-18 DIAGNOSIS — Z7984 Long term (current) use of oral hypoglycemic drugs: Secondary | ICD-10-CM | POA: Insufficient documentation

## 2016-07-18 DIAGNOSIS — Z8673 Personal history of transient ischemic attack (TIA), and cerebral infarction without residual deficits: Secondary | ICD-10-CM | POA: Diagnosis not present

## 2016-07-18 DIAGNOSIS — I83899 Varicose veins of unspecified lower extremities with other complications: Secondary | ICD-10-CM

## 2016-07-18 DIAGNOSIS — E119 Type 2 diabetes mellitus without complications: Secondary | ICD-10-CM | POA: Diagnosis not present

## 2016-07-18 LAB — COMPREHENSIVE METABOLIC PANEL
ALT: 14 U/L (ref 14–54)
AST: 30 U/L (ref 15–41)
Albumin: 3.6 g/dL (ref 3.5–5.0)
Alkaline Phosphatase: 78 U/L (ref 38–126)
Anion gap: 11 (ref 5–15)
BILIRUBIN TOTAL: 0.4 mg/dL (ref 0.3–1.2)
BUN: 42 mg/dL — ABNORMAL HIGH (ref 6–20)
CHLORIDE: 105 mmol/L (ref 101–111)
CO2: 23 mmol/L (ref 22–32)
CREATININE: 1.62 mg/dL — AB (ref 0.44–1.00)
Calcium: 9.3 mg/dL (ref 8.9–10.3)
GFR, EST AFRICAN AMERICAN: 33 mL/min — AB (ref >60)
GFR, EST NON AFRICAN AMERICAN: 28 mL/min — AB (ref >60)
Glucose, Bld: 130 mg/dL — ABNORMAL HIGH (ref 65–99)
POTASSIUM: 4 mmol/L (ref 3.5–5.1)
Sodium: 139 mmol/L (ref 135–145)
Total Protein: 6.9 g/dL (ref 6.5–8.1)

## 2016-07-18 LAB — CBC WITH DIFFERENTIAL/PLATELET
Basophils Absolute: 0 10*3/uL (ref 0.0–0.1)
Basophils Relative: 0 %
EOS PCT: 2 %
Eosinophils Absolute: 0.2 10*3/uL (ref 0.0–0.7)
HEMATOCRIT: 29.3 % — AB (ref 36.0–46.0)
Hemoglobin: 9.5 g/dL — ABNORMAL LOW (ref 12.0–15.0)
LYMPHS ABS: 1.5 10*3/uL (ref 0.7–4.0)
LYMPHS PCT: 19 %
MCH: 34.1 pg — AB (ref 26.0–34.0)
MCHC: 32.4 g/dL (ref 30.0–36.0)
MCV: 105 fL — AB (ref 78.0–100.0)
MONOS PCT: 7 %
Monocytes Absolute: 0.6 10*3/uL (ref 0.1–1.0)
Neutro Abs: 6 10*3/uL (ref 1.7–7.7)
Neutrophils Relative %: 72 %
PLATELETS: 227 10*3/uL (ref 150–400)
RBC: 2.79 MIL/uL — ABNORMAL LOW (ref 3.87–5.11)
RDW: 14.3 % (ref 11.5–15.5)
WBC: 8.2 10*3/uL (ref 4.0–10.5)

## 2016-07-18 LAB — URINALYSIS, ROUTINE W REFLEX MICROSCOPIC
Bilirubin Urine: NEGATIVE
Glucose, UA: NEGATIVE mg/dL
HGB URINE DIPSTICK: NEGATIVE
Ketones, ur: NEGATIVE mg/dL
Leukocytes, UA: NEGATIVE
Nitrite: NEGATIVE
Protein, ur: NEGATIVE mg/dL
SPECIFIC GRAVITY, URINE: 1.013 (ref 1.005–1.030)
pH: 6 (ref 5.0–8.0)

## 2016-07-18 LAB — TYPE AND SCREEN
ABO/RH(D): O NEG
Antibody Screen: NEGATIVE

## 2016-07-18 LAB — POC OCCULT BLOOD, ED: FECAL OCCULT BLD: NEGATIVE

## 2016-07-18 NOTE — ED Triage Notes (Signed)
Patient states she went to the bathroom this am and noticed rectal bleeding. Denies pain.

## 2016-07-18 NOTE — ED Provider Notes (Signed)
[facility] DEPT Provider Note   CSN: [id number] Arrival date & time: 07/18/16  0301   By signing my name below, I, Delton Prairie, attest that this documentation has been prepared under the direction and in the presence of Orpah Greek, MD  Electronically Signed: Delton Prairie, ED Scribe. 07/18/16. 4:01 AM.   History   Chief Complaint Chief Complaint  Patient presents with  . Rectal Bleeding   The history is provided by the patient. No language interpreter was used.   HPI Comments:  Shannon [name] is a 83 y.o. female, with a hx of CAD, CHF, DM, hyperlipidemia , 2 MIs, TIA and a-fib, who presents to the Emergency Department complaining of rectal bleeding with dark blood onset today. She also reports diarrhea and burning rectal pain. Pt denies abdominal pain, chest pain, SOB, dizziness, weakness, lightheadedness and syncope. No other associated symptoms noted. Pt is on Eliquis   Past Medical History:  Diagnosis Date  . Arthritis   . Baker's cyst of knee   . CAD (coronary artery disease)    a. 05/2006 Cath/PCI: LAD 88m, D1 40 ost, LCX nl, OM1 95 (3.0x20 Taxus DES), RCA nl, EF 40% w/ lat AK;  04/2007 Ex MV: minimal lat ischemia in area of prior infarct->low risk-> med Rx.  . Chronic atrial fibrillation (HCC)    a. on Coumadin  . Chronic systolic (congestive) heart failure    a. 04/2015: EF 50-55% b. echo 12/2015: EF 20-25% w/ diffuse HK, biatrial enlargement, mild AI and MR, severe TR    . Diabetes mellitus   . Hyperlipidemia   . MVP (mitral valve prolapse)    a. 10/2003 Echo: nl LV fxn, mild MR/TR/AS  . Myocardial infarction   . Sleep apnea   . Transient ischemic attack     Patient Active Problem List   Diagnosis Date Noted  . Abnormal CT of the chest 03/29/2016  . DNR (do not resuscitate) discussion   . 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 Anxiety 02/03/2016  . Paroxysmal atrial fibrillation ([place]) 01/26/2016  . Palliative care encounter   . Advanced  care planning/counseling discussion   . Dyspnea   . Shortness of breath   . Pleural effusion   . Persistent atrial fibrillation ([place])   . Pressure ulcer 01/09/2016  . SOB (shortness of breath)   . Elevated troponin   . CAD (coronary artery disease)   . Cardiomyopathy ([place])   . Chronic systolic CHF (congestive heart failure) ([place])   . Abdominal pain 12/31/2015  . Long term (current) use of anticoagulants [Z79.01] 10/24/2015  . Foot ulcer, left ([place])   . Chronic venous stasis dermatitis 10/09/2015  . Cellulitis of toe, left great 10/09/2015  . Edema 01/31/2015  . Atrial flutter ([place]) 10/09/2012  . UTI (urinary tract infection) 10/09/2012  . MVP (mitral valve prolapse)   . Hyperlipidemia   . Diabetes mellitus ([place])     Past Surgical History:  Procedure Laterality Date  . ATRIAL FLUTTER ABLATION N/A 10/09/2012   Procedure: ATRIAL FLUTTER ABLATION;  Surgeon: Thompson Grayer, MD;  Location: [facility] CATH LAB;  Service: Cardiovascular;  Laterality: N/A;  . CARDIAC CATHETERIZATION N/A 01/06/2016   Procedure: Right/Left Heart Cath and Coronary Angiography;  Surgeon: Belva Crome, MD;  Location: [facility] CV LAB;  Service: Cardiovascular;  Laterality: N/A;  . CARDIOVERSION N/A 01/11/2016   Procedure: CARDIOVERSION;  Surgeon: Larey Dresser, MD;  Location: [facility] ENDOSCOPY;  Service: Cardiovascular;  Laterality: N/A;  . cataracts    .  COLONOSCOPY N/A 10/13/2015   Procedure: COLONOSCOPY;  Surgeon: Manus Gunning, MD;  Location: [facility] ENDOSCOPY;  Service: Gastroenterology;  Laterality: N/A;  . CORONARY ANGIOPLASTY     Status post PTCA and stenting of the first obtuse marginal-05/13/2006. We placed a 3.0 x 20 mm Taxus stent. It was post dilated using a 3.25 mm noncompliant balloon up to 14 atmospheres  . TONSILLECTOMY      OB History    No data available       Home Medications    Prior to Admission medications   Medication Sig Start Date End Date Taking? Authorizing Provider  apixaban  (ELIQUIS) 2.5 MG TABS tablet Take 2.5 mg by mouth 2 (two) times daily.   Yes Historical Provider, MD  atorvastatin (LIPITOR) 20 MG tablet Take 1 tablet (20 mg total) by mouth daily. Please call and schedule a 3 month follow up appointment with Dr Acie Fredrickson per last office visit 03/26/16  Yes Thayer Headings, MD  digoxin (LANOXIN) 0.125 MG tablet Take 0.5 tablets (0.0625 mg total) by mouth every other day. 03/27/16  Yes Larey Dresser, MD  furosemide (LASIX) 20 MG tablet Take 1 tablet (20 mg total) by mouth daily. 06/18/16  Yes Larey Dresser, MD  HYDROcodone-acetaminophen (NORCO/VICODIN) 5-325 MG tablet Take 0.5-1 tablets by mouth every 4 (four) hours as needed for severe pain. 01/16/16  Yes Maryann Mikhail, DO  metFORMIN (GLUCOPHAGE) 500 MG tablet Take 500 mg by mouth daily with breakfast.  04/06/15  Yes Historical Provider, MD  nitroGLYCERIN (NITROSTAT) 0.4 MG SL tablet Place 0.4 mg under the tongue every 5 (five) minutes x 3 doses as needed for chest pain. Up to 3 doses, if pain continues, call 911. 06/05/11  Yes Thayer Headings, MD  spironolactone (ALDACTONE) 25 MG tablet Take 0.5 tablets (12.5 mg total) by mouth daily. 06/07/16  Yes Larey Dresser, MD  vitamin C (ASCORBIC ACID) 250 MG tablet Take 250 mg by mouth daily. Veggie cap with 250mg  vitamin C and 250mg  of cayenne   Yes Historical Provider, MD  ACCU-CHEK SMARTVIEW test strip  09/19/15   Historical Provider, MD  acetaminophen (TYLENOL) 325 MG tablet Take 2 tablets (650 mg total) by mouth every 4 (four) hours as needed for headache or mild pain. Patient not taking: Reported on 07/18/2016 01/16/16   Velta Addison Mikhail, DO  hydrocortisone (PROCTO-PAK) 1 % CREA Apply 1 application topically daily. Patient not taking: Reported on 07/18/2016 10/14/15   Donne Hazel, MD  mupirocin ointment (BACTROBAN) 2 % Place into the nose 2 (two) times daily. Patient not taking: Reported on 07/18/2016 01/16/16   Cristal Ford, DO    Family History Family History    Problem Relation Age of Onset  . Heart attack Mother   . Stroke Mother   . Heart attack Father   . Heart attack Brother   . Coronary artery disease Sister     CABG  . Alzheimer's disease Sister     X3  . Stroke Sister   . Heart attack Sister     Social History Social History  Substance Use Topics  . Smoking status: Former Smoker    Packs/day: 1.00    Types: Cigarettes    Quit date: 07/03/1963  . Smokeless tobacco: Never Used  . Alcohol use No     Allergies   Demerol; Erythromycin; Penicillins; and Percocet [oxycodone-acetaminophen]   Review of Systems Review of Systems  Constitutional: Negative for fever.  Respiratory: Negative for shortness of breath.  Cardiovascular: Negative for chest pain.  Gastrointestinal: Positive for anal bleeding, diarrhea and rectal pain. Negative for abdominal pain.  Neurological: Negative for dizziness, syncope and weakness.  All other systems reviewed and are negative.  Physical Exam Updated Vital Signs BP 121/83   Pulse 94   Temp 97.5 F (36.4 C) (Oral)   Resp 10   Ht 5\' 2"  (1.575 m)   Wt 113 lb (51.3 kg)   SpO2 99%   BMI 20.67 kg/m   Physical Exam  Constitutional: She is oriented to person, place, and time. She appears well-developed and well-nourished. No distress.  HENT:  Head: Normocephalic and atraumatic.  Right Ear: Hearing normal.  Left Ear: Hearing normal.  Nose: Nose normal.  Mouth/Throat: Oropharynx is clear and moist and mucous membranes are normal.  Eyes: Conjunctivae and EOM are normal. Pupils are equal, round, and reactive to light.  Neck: Normal range of motion. Neck supple.  Cardiovascular: Regular rhythm, S1 normal and S2 normal.  Exam reveals no gallop and no friction rub.   No murmur heard. Pulmonary/Chest: Effort normal and breath sounds normal. No respiratory distress. She exhibits no tenderness.  Abdominal: Soft. Normal appearance and bowel sounds are normal. There is no hepatosplenomegaly. There is  no tenderness. There is no rebound, no guarding, no tenderness at McBurney's point and negative Murphy's sign. No hernia.  Musculoskeletal: Normal range of motion.  Neurological: She is alert and oriented to person, place, and time. She has normal strength. No cranial nerve deficit or sensory deficit. Coordination normal. GCS eye subscore is 4. GCS verbal subscore is 5. GCS motor subscore is 6.  Skin: Skin is warm, dry and intact. No rash noted. No cyanosis.  Psychiatric: She has a normal mood and affect. Her speech is normal and behavior is normal. Thought content normal.  Nursing note and vitals reviewed.    ED Treatments / Results  DIAGNOSTIC STUDIES:  Oxygen Saturation is 100% on RA, normal by my interpretation.    COORDINATION OF CARE:  4:00 AM Discussed treatment plan with pt at bedside and pt agreed to plan.  Labs (all labs ordered are listed, but only abnormal results are displayed) Labs Reviewed  CBC WITH DIFFERENTIAL/PLATELET - Abnormal; Notable for the following:       Result Value   RBC 2.79 (*)    Hemoglobin 9.5 (*)    HCT 29.3 (*)    MCV 105.0 (*)    MCH 34.1 (*)    All other components within normal limits  COMPREHENSIVE METABOLIC PANEL - Abnormal; Notable for the following:    Glucose, Bld 130 (*)    BUN 42 (*)    Creatinine, Ser 1.62 (*)    GFR calc non Af Amer 28 (*)    GFR calc Af Amer 33 (*)    All other components within normal limits  URINALYSIS, ROUTINE W REFLEX MICROSCOPIC  POC OCCULT BLOOD, ED  TYPE AND SCREEN    EKG  EKG Interpretation  Date/Time:  Wednesday July 18 2016 03:41:20 EST Ventricular Rate:  96 PR Interval:    QRS Duration: 100 QT Interval:  363 QTC Calculation: 459 R Axis:   -62 Text Interpretation:  Sinus or ectopic atrial rhythm vs Atrial Flutter Short PR interval LAD, consider left anterior fascicular block Anteroseptal infarct, age indeterminate No significant change since last tracing Confirmed by POLLINA  MD,  CHRISTOPHER [phone]) on 07/18/2016 4:47:53 AM       Radiology No results found.  Procedures Procedures (including critical  care time)  Medications Ordered in ED Medications - No data to display   Initial Impression / Assessment and Plan / ED Course  I have reviewed the triage vital signs and the nursing notes.  Pertinent labs & imaging results that were available during my care of the patient were reviewed by me and considered in my medical decision making (see chart for details).  Clinical Course    Patient presents to the emergency department with concerns of possible rectal bleeding. Patient reports that she had a somewhat loose stool this evening and then when she was on the toilet she noticed some dark blood dripping into the toilet. She has not had any abdominal pain. Patient denies fever, chills, nausea and vomiting.  Patient's hemoglobin was found to be at baseline. Vital signs are stable for her. I did perform rectal exam and anoscopy, no evidence of bleeding. Stool sample was Hemoccult negative. During this examination, I did notice that she had an incontinence pad on and the bleeding was more between her legs in the area of the vagina, as opposed to the rectum. A pelvic exam was therefore performed. Speculum examination did not reveal any blood in the vagina, however, there is a small varicosity at the posterior aspect of the right external labia that appears to be the bleeding source. There was dried blood and scabbing over the varicosity.  Patient will be instructed to hold her Eliquis for 1 day, then restart her medications. Follow-up with primary doctor.  Final Clinical Impressions(s) / ED Diagnoses   Final diagnoses:  Bleeding from varicose vein    New Prescriptions New Prescriptions   No medications on file  I personally performed the services described in this documentation, which was scribed in my presence. The recorded information has been reviewed and is  accurate.     Orpah Greek, MD 07/18/16 [phone]

## 2016-07-18 NOTE — Discharge Instructions (Signed)
Hold your Eliquis today, then restart tomorrow.

## 2016-07-24 ENCOUNTER — Other Ambulatory Visit: Payer: Self-pay

## 2016-07-24 ENCOUNTER — Ambulatory Visit (HOSPITAL_BASED_OUTPATIENT_CLINIC_OR_DEPARTMENT_OTHER)
Admission: RE | Admit: 2016-07-24 | Discharge: 2016-07-24 | Disposition: A | Discharge status: Home / Self Care | Payer: Medicare HMO | Source: Ambulatory Visit | Attending: Cardiology | Admitting: Cardiology

## 2016-07-24 ENCOUNTER — Encounter (HOSPITAL_COMMUNITY): Payer: Self-pay

## 2016-07-24 ENCOUNTER — Ambulatory Visit (HOSPITAL_COMMUNITY)
Admission: RE | Admit: 2016-07-24 | Discharge: 2016-07-24 | Disposition: A | Discharge status: Home / Self Care | Payer: Medicare HMO | Source: Ambulatory Visit | Attending: Internal Medicine | Admitting: Internal Medicine

## 2016-07-24 VITALS — BP 110/72 | HR 94 | Ht 62.0 in | Wt 118.8 lb

## 2016-07-24 DIAGNOSIS — Z79899 Other long term (current) drug therapy: Secondary | ICD-10-CM | POA: Insufficient documentation

## 2016-07-24 DIAGNOSIS — E7849 Other hyperlipidemia: Secondary | ICD-10-CM

## 2016-07-24 DIAGNOSIS — Z7984 Long term (current) use of oral hypoglycemic drugs: Secondary | ICD-10-CM | POA: Insufficient documentation

## 2016-07-24 DIAGNOSIS — E784 Other hyperlipidemia: Secondary | ICD-10-CM | POA: Diagnosis not present

## 2016-07-24 DIAGNOSIS — I5022 Chronic systolic (congestive) heart failure: Secondary | ICD-10-CM

## 2016-07-24 DIAGNOSIS — I480 Paroxysmal atrial fibrillation: Secondary | ICD-10-CM

## 2016-07-24 DIAGNOSIS — Z87891 Personal history of nicotine dependence: Secondary | ICD-10-CM | POA: Diagnosis not present

## 2016-07-24 DIAGNOSIS — I4892 Unspecified atrial flutter: Secondary | ICD-10-CM | POA: Diagnosis not present

## 2016-07-24 DIAGNOSIS — I2510 Atherosclerotic heart disease of native coronary artery without angina pectoris: Secondary | ICD-10-CM | POA: Insufficient documentation

## 2016-07-24 DIAGNOSIS — Z5189 Encounter for other specified aftercare: Secondary | ICD-10-CM | POA: Diagnosis not present

## 2016-07-24 LAB — BASIC METABOLIC PANEL
Anion gap: 5 (ref 5–15)
BUN: 37 mg/dL — ABNORMAL HIGH (ref 6–20)
CHLORIDE: 106 mmol/L (ref 101–111)
CO2: 26 mmol/L (ref 22–32)
CREATININE: 1.49 mg/dL — AB (ref 0.44–1.00)
Calcium: 9.4 mg/dL (ref 8.9–10.3)
GFR, EST AFRICAN AMERICAN: 37 mL/min — AB (ref >60)
GFR, EST NON AFRICAN AMERICAN: 32 mL/min — AB (ref >60)
Glucose, Bld: 104 mg/dL — ABNORMAL HIGH (ref 65–99)
POTASSIUM: 4.6 mmol/L (ref 3.5–5.1)
SODIUM: 137 mmol/L (ref 135–145)

## 2016-07-24 LAB — DIGOXIN LEVEL: DIGOXIN LVL: 0.4 ng/mL — AB (ref 0.8–2.0)

## 2016-07-24 LAB — LIPID PANEL
CHOLESTEROL: 119 mg/dL (ref 0–200)
HDL: 63 mg/dL (ref >40)
LDL Cholesterol: 42 mg/dL (ref 0–99)
Total CHOL/HDL Ratio: 1.9 RATIO
Triglycerides: 70 mg/dL (ref <150)
VLDL: 14 mg/dL (ref 0–40)

## 2016-07-24 MED ORDER — BISOPROLOL FUMARATE 5 MG PO TABS: 2.5000 mg | ORAL_TABLET | Freq: Every day | ORAL | 3 refills | Status: DC

## 2016-07-24 MED ORDER — APIXABAN 2.5 MG PO TABS: 2.5000 mg | ORAL_TABLET | Freq: Two times a day (BID) | ORAL | 3 refills | Status: DC

## 2016-07-24 NOTE — Patient Instructions (Signed)
Start Bisoprolol 2.5 mg (1/2 tab) daily  Take Furosemide (Lasix) 20 mg daily  Labs today  Your physician recommends that you schedule a follow-up appointment in: 6 weeks

## 2016-07-25 ENCOUNTER — Telehealth (INDEPENDENT_AMBULATORY_CARE_PROVIDER_SITE_OTHER): Payer: Self-pay | Admitting: *Deleted

## 2016-07-25 NOTE — Telephone Encounter (Signed)
Pt called asking for hydrocodone. Put stated she hasn't been seen in quite awhile and doesn't know if she needs to be seen or if she can get a refill.

## 2016-07-25 NOTE — Telephone Encounter (Signed)
I called and left voicemail she has not been seen since 2016, she needs appointment. Narcotics are not prescribed on chronic basis.

## 2016-07-25 NOTE — Progress Notes (Signed)
Patient ID: Shannon [name], female   DOB: Sep 16, 1931, 83 y.o.   MRN: [id number] PCP: Dr. Osborne Casco Cardiology: Dr. [name]  83 yo with paroxysmal atrial fibrillation, chronic systolic CHF, and CAD presents for cardiology followup. She apparently had been in persistent atrial fibrillation since around 8/16.  It appears that she was tachycardic most of the time prior to her recent admission in 7/17.  She had been on amiodarone prior to admission but it was not keeping her in NSR.  She was admitted with acute systolic CHF.  EF had dropped to 20-25% in 7/17 from 50-55% in 10/16.  She was in atrial fibrillation with RVR.  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  Amiodarone was stopped due to suspicion for amiodarone-induced lung toxicity (she was seen by pulmonary).  She was eventually cardioverted to NSR.  Ranolazine was started to try to keep her in NSR.  HR was low in the hospital so she has not been on a beta blocker.  She was extensively diuresed and discharged to SNF.  After discharge, she developed persistent nausea.  Eventually, it was found that the nausea was likely from ranolazine, so this medication was stopped and the nausea resolved.    She is now living at home.  Symptomatically, she feels much better.  Her appetite is back.  Weight stable.  BP better, no lightheadedness . No dyspnea walking on flat ground, uses a cane for balance.  Able to walk into office today without problem.  She goes shopping in stores and is able to do all the housework like cooking, washing dishes.  No orthopnea/PND.  Rare palpitations. No chest pain.  She still fatigues easily. She was unable to tolerate losartan.   Echo was done today and reviewed, EF 30-35% with diffuse hypokinesis, mildly decreased RV systolic function, moderate TR and MR, PASP 53 mmHg, severe LAE.   ECG: atypical flutter rate 92  Labs (7/17): Digoxin 1.6 => 0.3, K 4.6, creatinine 0.87 => 0.76, HCT 34,  BNP 265 Labs (9/17): K 5, creatinine 1.36 Labs (11/17): K 4.9, creatinine 1.29, hgb 9.2, digoxin 0.5 Labs (12/17): hgb 10.4, digoxin 0.4 Labs (1/18): K 4, creatinine 1.62, hgb 9.5  PMH: 1. Atrial fibrillation: Paroxysmal.  Had been persistent since 8/16, was cardioverted to NSR during 7/17 admission.  Possible atypical atrial flutter on 12/17 ECG.  - Nausea with ranolazine used for atrial fibrillation suppression.  2. 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  She had been in atrial fibrillation with elevated rate possibly for months prior to 7/17 admission.  - Echo (10/16): EF 50-55%.  - Echo (7/17): EF 20-25%, mildly decreased RV systolic function, severe TR, PA systolic pressure 64 mmHg.  - LHC/RHC (7/17): Patent OM stent, no obstructive CAD.  Mean RA 13, PA 45/25, mean PCWP 16, CI 2.36.  - Echo (1/18): EF 30-35% with diffuse hypokinesis, mildly decreased RV systolic function, moderate TR and MR, PASP 53 mmHg, severe LAE.  3. CAD: DES to OM in 2007.  LHC (7/17) with patent stent and no new obstructive disease.  4. Type II diabetes 5. Hyperlipidemia 6. TIA 7. Possible amiodarone lung toxicity: Chest CT (7/17) with bronchiectasis/fibrosis, patchy bilateral densities.   Social History   Social History  . Marital status: Married    Spouse name: N/A  . Number of children: N/A  . Years of education: N/A   Occupational History  . Not on file.   Social History Main Topics  . Smoking  status: Former Smoker    Packs/day: 1.00    Types: Cigarettes    Quit date: 07/03/1963  . Smokeless tobacco: Never Used  . Alcohol use No  . Drug use: No  . Sexual activity: No   Other Topics Concern  . Not on file   Social History Narrative   Lives in [place] with husband.  Does not routinely exercise but is able to keep house and go food shopping.   Family History  Problem Relation Age of Onset  . Heart attack Mother   . Stroke Mother   . Heart attack Father    . Heart attack Brother   . Coronary artery disease Sister     CABG  . Alzheimer's disease Sister     X3  . Stroke Sister   . Heart attack Sister    ROS: All systems reviewed and negative except as per HPI.   Current Outpatient Prescriptions  Medication Sig Dispense Refill  . ACCU-CHEK SMARTVIEW test strip     . acetaminophen (TYLENOL) 325 MG tablet Take 2 tablets (650 mg total) by mouth every 4 (four) hours as needed for headache or mild pain.    [name] apixaban (ELIQUIS) 2.5 MG TABS tablet Take 1 tablet (2.5 mg total) by mouth 2 (two) times daily. 180 tablet 3  . atorvastatin (LIPITOR) 20 MG tablet Take 1 tablet (20 mg total) by mouth daily. Please call and schedule a 3 month follow up appointment with Dr Acie Fredrickson per last office visit 90 tablet 2  . digoxin (LANOXIN) 0.125 MG tablet Take 0.5 tablets (0.0625 mg total) by mouth every other day. 15 tablet 3  . furosemide (LASIX) 20 MG tablet Take 1 tablet (20 mg total) by mouth daily. 30 tablet 3  . HYDROcodone-acetaminophen (NORCO/VICODIN) 5-325 MG tablet Take 0.5-1 tablets by mouth every 4 (four) hours as needed for severe pain. 20 tablet 0  . hydrocortisone (PROCTO-PAK) 1 % CREA Apply 1 application topically daily. 1 Tube 0  . metFORMIN (GLUCOPHAGE) 500 MG tablet Take 500 mg by mouth daily with breakfast.     . mupirocin ointment (BACTROBAN) 2 % Place into the nose 2 (two) times daily. 22 g 0  . nitroGLYCERIN (NITROSTAT) 0.4 MG SL tablet Place 0.4 mg under the tongue every 5 (five) minutes x 3 doses as needed for chest pain. Up to 3 doses, if pain continues, call 911.    . spironolactone (ALDACTONE) 25 MG tablet Take 0.5 tablets (12.5 mg total) by mouth daily. 15 tablet 6  . vitamin C (ASCORBIC ACID) 250 MG tablet Take 250 mg by mouth daily. Veggie cap with 250mg  vitamin C and 250mg  of cayenne    . bisoprolol (ZEBETA) 5 MG tablet Take 0.5 tablets (2.5 mg total) by mouth daily. 15 tablet 3   No current facility-administered medications for  this encounter.    BP 110/72 (BP Location: Left Arm, Patient Position: Sitting, Cuff Size: Normal)   Pulse 94   Ht 5\' 2"  (1.575 m)   Wt 118 lb 12.8 oz (53.9 kg)   SpO2 98%   BMI 21.73 kg/m  General: NAD Neck: JVP 8 cm, no thyromegaly or thyroid nodule.  Lungs: Clear to auscultation bilaterally with normal respiratory effort. CV: Nondisplaced PMI.  Heart regular S1/S2, no S3/S4, 2/6 HSM LLSB.  Trace ankle edema bilaterally.  No carotid bruit.  Normal pedal pulses.  Abdomen: Soft, nontender, no hepatosplenomegaly, mild distention.  Skin: Intact without lesions or rashes.  Neurologic: Alert and oriented x  3.  Psych: Normal affect. Extremities: No clubbing or cyanosis.  HEENT: Normal.   Assessment/Plan: 1. Chronic systolic CHF: EF 52-84% on 7/17 echo, 30-35% on 1/18 echo.  Nonischemic cardiomyopathy, possible role for tachycardia-mediated cardiomyopathy (some improvement in EF with rate control but not back to normal). She is in probable atypical atrial flutter today, but rate is controlled in 90s.  NYHA class II symptoms.  She is not volume overloaded on exam.  - Continue Lasix 20 mg daily.  BMET today.  - Continue digoxin, check level today.   - Unable to tolerate losartan at even low dose.  - Add bisoprolol 2.5 mg daily.  - Continue spironolactone 12.5 daily.  2. Atrial fibrillation/flutter:  Need to keep in NSR or at least rate-controlled given concern for tachy-mediated cardiomyopathy.  She had suspected amiodarone-induced lung toxicity so ranolazine was started.  She did not tolerate ranolazine due to nausea.  Today, she appears to be in atypical atrial flutter still (similar to last appointment) but rate is controlled so I will not plan on DCCV at this point.  - Continue apixaban 2.5 mg bid.  - If atrial fibrillation with difficult-to-control RVR recurs, would consider ablate/pace.  3. CAD: Patent OM stent on coronary angiography in 7/17.  Continue statin. No ASA with warfarin use.   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  Off amiodarone.  Follows with pulmonary.   Followup in 6 wks  [name] 07/25/2016

## 2016-08-02 ENCOUNTER — Telehealth (HOSPITAL_COMMUNITY): Payer: Self-pay | Admitting: *Deleted

## 2016-08-02 NOTE — Telephone Encounter (Signed)
Completed form for [employer] and prescription for Eliquis mailed to patient's address at [address] [place] [address].

## 2016-08-10 DIAGNOSIS — R1312 Dysphagia, oropharyngeal phase: Secondary | ICD-10-CM | POA: Diagnosis not present

## 2016-08-10 DIAGNOSIS — M6281 Muscle weakness (generalized): Secondary | ICD-10-CM | POA: Diagnosis not present

## 2016-08-10 DIAGNOSIS — R269 Unspecified abnormalities of gait and mobility: Secondary | ICD-10-CM | POA: Diagnosis not present

## 2016-08-10 DIAGNOSIS — Z5189 Encounter for other specified aftercare: Secondary | ICD-10-CM | POA: Diagnosis not present

## 2016-08-10 DIAGNOSIS — J9601 Acute respiratory failure with hypoxia: Secondary | ICD-10-CM | POA: Diagnosis not present

## 2016-08-10 DIAGNOSIS — R0602 Shortness of breath: Secondary | ICD-10-CM | POA: Diagnosis not present

## 2016-08-10 DIAGNOSIS — I2510 Atherosclerotic heart disease of native coronary artery without angina pectoris: Secondary | ICD-10-CM | POA: Diagnosis not present

## 2016-08-10 DIAGNOSIS — I5021 Acute systolic (congestive) heart failure: Secondary | ICD-10-CM | POA: Diagnosis not present

## 2016-08-16 ENCOUNTER — Telehealth: Payer: Self-pay | Admitting: Cardiovascular Disease

## 2016-08-16 ENCOUNTER — Other Ambulatory Visit (HOSPITAL_COMMUNITY): Payer: Self-pay | Admitting: *Deleted

## 2016-08-16 MED ORDER — APIXABAN 2.5 MG PO TABS: 2.5000 mg | ORAL_TABLET | Freq: Two times a day (BID) | ORAL | 3 refills | Status: DC

## 2016-08-16 MED ORDER — NITROGLYCERIN 0.4 MG SL SUBL: 0.4000 mg | SUBLINGUAL_TABLET | SUBLINGUAL | 3 refills | Status: DC | PRN

## 2016-08-16 NOTE — Telephone Encounter (Signed)
New Message     *STAT* If patient is at the pharmacy, call can be transferred to refill team.   1. Which medications need to be refilled? (please list name of each medication and dose if known) apixaban (ELIQUIS) 2.5 MG TABS tablet, nitroGLYCERIN (NITROSTAT) 0.4 MG SL tablet  2. Which pharmacy/location (including street and city if local pharmacy) is medication to be sent to? [facility] [employer]  3. Do they need a 30 day or 90 day supply? 30 day supply

## 2016-08-17 ENCOUNTER — Other Ambulatory Visit (HOSPITAL_COMMUNITY): Payer: Self-pay | Admitting: Cardiology

## 2016-08-17 MED ORDER — NITROGLYCERIN 0.4 MG SL SUBL: 0.4000 mg | SUBLINGUAL_TABLET | SUBLINGUAL | 3 refills | Status: DC | PRN

## 2016-08-20 ENCOUNTER — Other Ambulatory Visit (HOSPITAL_COMMUNITY): Payer: Self-pay | Admitting: Cardiology

## 2016-08-20 MED ORDER — DIGOXIN 125 MCG PO TABS
0.0625 mg | ORAL_TABLET | ORAL | 11 refills | Status: DC
Start: 2016-08-20 — End: 2016-08-23

## 2016-08-21 ENCOUNTER — Other Ambulatory Visit: Payer: Self-pay | Admitting: *Deleted

## 2016-08-21 NOTE — Telephone Encounter (Signed)
Patient left a msg stating that the pharmacy did not receive the rx for nitro.

## 2016-08-22 MED ORDER — NITROGLYCERIN 0.4 MG SL SUBL: 0.4000 mg | SUBLINGUAL_TABLET | SUBLINGUAL | 3 refills | Status: DC | PRN

## 2016-08-23 ENCOUNTER — Other Ambulatory Visit (HOSPITAL_COMMUNITY): Payer: Self-pay | Admitting: Cardiology

## 2016-08-23 MED ORDER — DIGOXIN 125 MCG PO TABS: 0.0625 mg | ORAL_TABLET | ORAL | 2 refills | Status: DC

## 2016-09-04 ENCOUNTER — Ambulatory Visit (HOSPITAL_COMMUNITY)
Admission: RE | Admit: 2016-09-04 | Discharge: 2016-09-04 | Disposition: A | Discharge status: Home / Self Care | Payer: Medicare HMO | Source: Ambulatory Visit | Attending: Cardiology | Admitting: Cardiology

## 2016-09-04 ENCOUNTER — Telehealth: Payer: Self-pay | Admitting: Pharmacist

## 2016-09-04 VITALS — BP 110/88 | HR 138 | Wt 123.4 lb

## 2016-09-04 DIAGNOSIS — I480 Paroxysmal atrial fibrillation: Secondary | ICD-10-CM | POA: Insufficient documentation

## 2016-09-04 DIAGNOSIS — Z79899 Other long term (current) drug therapy: Secondary | ICD-10-CM | POA: Diagnosis not present

## 2016-09-04 DIAGNOSIS — Z5189 Encounter for other specified aftercare: Secondary | ICD-10-CM | POA: Diagnosis not present

## 2016-09-04 DIAGNOSIS — Z87891 Personal history of nicotine dependence: Secondary | ICD-10-CM | POA: Diagnosis not present

## 2016-09-04 DIAGNOSIS — Z8673 Personal history of transient ischemic attack (TIA), and cerebral infarction without residual deficits: Secondary | ICD-10-CM | POA: Diagnosis not present

## 2016-09-04 DIAGNOSIS — I429 Cardiomyopathy, unspecified: Secondary | ICD-10-CM | POA: Insufficient documentation

## 2016-09-04 DIAGNOSIS — Z8249 Family history of ischemic heart disease and other diseases of the circulatory system: Secondary | ICD-10-CM | POA: Diagnosis not present

## 2016-09-04 DIAGNOSIS — I2510 Atherosclerotic heart disease of native coronary artery without angina pectoris: Secondary | ICD-10-CM | POA: Diagnosis not present

## 2016-09-04 DIAGNOSIS — Z7984 Long term (current) use of oral hypoglycemic drugs: Secondary | ICD-10-CM | POA: Diagnosis not present

## 2016-09-04 DIAGNOSIS — E785 Hyperlipidemia, unspecified: Secondary | ICD-10-CM | POA: Insufficient documentation

## 2016-09-04 DIAGNOSIS — I5022 Chronic systolic (congestive) heart failure: Secondary | ICD-10-CM | POA: Diagnosis not present

## 2016-09-04 DIAGNOSIS — E119 Type 2 diabetes mellitus without complications: Secondary | ICD-10-CM | POA: Diagnosis not present

## 2016-09-04 DIAGNOSIS — Z823 Family history of stroke: Secondary | ICD-10-CM | POA: Diagnosis not present

## 2016-09-04 DIAGNOSIS — J984 Other disorders of lung: Secondary | ICD-10-CM | POA: Diagnosis not present

## 2016-09-04 DIAGNOSIS — I4892 Unspecified atrial flutter: Secondary | ICD-10-CM | POA: Diagnosis not present

## 2016-09-04 DIAGNOSIS — T462X5A Adverse effect of other antidysrhythmic drugs, initial encounter: Secondary | ICD-10-CM | POA: Diagnosis not present

## 2016-09-04 LAB — CBC
HCT: 32.1 % — ABNORMAL LOW (ref 36.0–46.0)
Hemoglobin: 10.5 g/dL — ABNORMAL LOW (ref 12.0–15.0)
MCH: 33.5 pg (ref 26.0–34.0)
MCHC: 32.7 g/dL (ref 30.0–36.0)
MCV: 102.6 fL — ABNORMAL HIGH (ref 78.0–100.0)
Platelets: 196 10*3/uL (ref 150–400)
RBC: 3.13 MIL/uL — AB (ref 3.87–5.11)
RDW: 15.2 % (ref 11.5–15.5)
WBC: 8.7 10*3/uL (ref 4.0–10.5)

## 2016-09-04 LAB — BASIC METABOLIC PANEL
Anion gap: 9 (ref 5–15)
BUN: 48 mg/dL — AB (ref 6–20)
CO2: 23 mmol/L (ref 22–32)
CREATININE: 1.58 mg/dL — AB (ref 0.44–1.00)
Calcium: 9.7 mg/dL (ref 8.9–10.3)
Chloride: 106 mmol/L (ref 101–111)
GFR calc Af Amer: 34 mL/min — ABNORMAL LOW (ref >60)
GFR, EST NON AFRICAN AMERICAN: 29 mL/min — AB (ref >60)
Glucose, Bld: 112 mg/dL — ABNORMAL HIGH (ref 65–99)
POTASSIUM: 4.5 mmol/L (ref 3.5–5.1)
SODIUM: 138 mmol/L (ref 135–145)

## 2016-09-04 LAB — MAGNESIUM: MAGNESIUM: 2.1 mg/dL (ref 1.7–2.4)

## 2016-09-04 LAB — DIGOXIN LEVEL: DIGOXIN LVL: 0.4 ng/mL — AB (ref 0.8–2.0)

## 2016-09-04 LAB — BRAIN NATRIURETIC PEPTIDE: B Natriuretic Peptide: 519.3 pg/mL — ABNORMAL HIGH (ref 0.0–100.0)

## 2016-09-04 MED ORDER — FUROSEMIDE 20 MG PO TABS: ORAL_TABLET | ORAL | 3 refills | Status: DC

## 2016-09-04 MED ORDER — BISOPROLOL FUMARATE 5 MG PO TABS: 5.0000 mg | ORAL_TABLET | Freq: Every day | ORAL | 6 refills | Status: DC

## 2016-09-04 NOTE — Patient Instructions (Signed)
INCREASE Bisoprolol to 5 mg (1 whole tablet) once daily.  INCREASE Lasix to 20 mg (1 tab) once daily alternating with 40 mg (2 tabs) once daily continuously.  Routine lab work today. Will notify you of abnormal results, otherwise no news is good news!  Return in approximately 10 days for repeat labs.  Will refer you to afib clinic with Roderic Palau for Tikosyn loading (this will require hospital admission). Bring an overnight bag with necessities with you to this appointment in the event you are admitted to hospital from this appointment.  Follow up 1-2 months with Dr. [name].  Do the following things EVERYDAY: 1) Weigh yourself in the morning before breakfast. Write it down and keep it in a log. 2) Take your medicines as prescribed 3) Eat low salt foods-Limit salt (sodium) to 2000 mg per day.  4) Stay as active as you can everyday 5) Limit all fluids for the day to less than 2 liters

## 2016-09-04 NOTE — Telephone Encounter (Signed)
Pt to be started on Tikosyn in the next few weeks. She does not take any QTc prolonging or contraindicated medications. She is on the correct dose of Eliquis 2.5mg  BID (age > 17, wt < 60kg, SCr > 1.5). Will need to confirm that pt has not missed any doses of Eliquis in the 3 weeks prior to Tikosyn initiation.

## 2016-09-04 NOTE — Progress Notes (Signed)
Patient ID: DIAMANTE TRUSZKOWSKI, female   DOB: 30-Aug-1931, 83 y.o.   MRN: [id number] PCP: Dr. Osborne Casco Cardiology: Dr. [name]  83 yo with paroxysmal atrial fibrillation, chronic systolic CHF, and CAD presents for cardiology followup. She apparently had been in persistent atrial fibrillation since around 8/16.  It appears that she was tachycardic most of the time prior to her recent admission in 7/17.  She had been on amiodarone prior to admission but it was not keeping her in NSR.  She was admitted with acute systolic CHF.  EF had dropped to 20-25% in 7/17 from 50-55% in 10/16.  She was in atrial fibrillation with RVR.  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  Amiodarone was stopped due to suspicion for amiodarone-induced lung toxicity (she was seen by pulmonary).  She was eventually cardioverted to NSR.  Ranolazine was started to try to keep her in NSR.  HR was low in the hospital so she has not been on a beta blocker.  She was extensively diuresed and discharged to SNF.  After discharge, she developed persistent nausea.  Eventually, it was found that the nausea was likely from ranolazine, so this medication was stopped and the nausea resolved.    She is now living at home.  She is back in atrial fibrillation today (was in rate-controlled flutter at last appt).  She sometimes feels the irregular heart rate.  She is short of breath after walking about 50 yards.  No orthopnea/PND.  No lightheadedness or chest pain.  She has not missed any Eliquis.  Weight is up about 5 lbs.   ECG (personally reviewed): atrial fibrillation at 106, left axis deviation, septal Qs, QTc 464 msec  Labs (7/17): Digoxin 1.6 => 0.3, K 4.6, creatinine 0.87 => 0.76, HCT 34, BNP 265 Labs (9/17): K 5, creatinine 1.36 Labs (11/17): K 4.9, creatinine 1.29, hgb 9.2, digoxin 0.5 Labs (12/17): hgb 10.4, digoxin 0.4 Labs (1/18): K 4, creatinine 1.62 => 1.49, hgb 9.5, LDL 42, HDL 63  PMH: 1.  Atrial fibrillation: Paroxysmal.  Had been persistent since 8/16, was cardioverted to NSR during 7/17 admission.  Possible atypical atrial flutter on 12/17 ECG.  By 3/18, atrial fibrillation again.  - Nausea with ranolazine used for atrial fibrillation suppression.  - Amiodarone-induced lon 2. 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  She had been in atrial fibrillation with elevated rate possibly for months prior to 7/17 admission.  - Echo (10/16): EF 50-55%.  - Echo (7/17): EF 20-25%, mildly decreased RV systolic function, severe TR, PA systolic pressure 64 mmHg.  - LHC/RHC (7/17): Patent OM stent, no obstructive CAD.  Mean RA 13, PA 45/25, mean PCWP 16, CI 2.36.  - Echo (1/18): EF 30-35% with diffuse hypokinesis, mildly decreased RV systolic function, moderate TR and MR, PASP 53 mmHg, severe LAE.  3. CAD: DES to OM in 2007.  LHC (7/17) with patent stent and no new obstructive disease.  4. Type II diabetes 5. Hyperlipidemia 6. TIA 7. Possible amiodarone lung toxicity: Chest CT (7/17) with bronchiectasis/fibrosis, patchy bilateral densities.   Social History   Social History  . Marital status: Married    Spouse name: N/A  . Number of children: N/A  . Years of education: N/A   Occupational History  . Not on file.   Social History Main Topics  . Smoking status: Former Smoker    Packs/day: 1.00    Types: Cigarettes    Quit date: 07/03/1963  . Smokeless tobacco:  Never Used  . Alcohol use No  . Drug use: No  . Sexual activity: No   Other Topics Concern  . Not on file   Social History Narrative   Lives in [place] with husband.  Does not routinely exercise but is able to keep house and go food shopping.   Family History  Problem Relation Age of Onset  . Heart attack Mother   . Stroke Mother   . Heart attack Father   . Heart attack Brother   . Coronary artery disease Sister     CABG  . Alzheimer's disease Sister     X3  . Stroke Sister    . Heart attack Sister    ROS: All systems reviewed and negative except as per HPI.   Current Outpatient Prescriptions  Medication Sig Dispense Refill  . apixaban (ELIQUIS) 2.5 MG TABS tablet Take 1 tablet (2.5 mg total) by mouth 2 (two) times daily. 180 tablet 3  . atorvastatin (LIPITOR) 20 MG tablet Take 1 tablet (20 mg total) by mouth daily. Please call and schedule a 3 month follow up appointment with Dr Acie Fredrickson per last office visit 90 tablet 2  . bisoprolol (ZEBETA) 5 MG tablet Take 1 tablet (5 mg total) by mouth daily. 30 tablet 6  . digoxin (LANOXIN) 0.125 MG tablet Take 0.5 tablets (0.0625 mg total) by mouth every other day. 45 tablet 2  . furosemide (LASIX) 20 MG tablet Take 20 mg (1 tab) once daily alternating with 40 mg (2 tabs) the next day continuously. 90 tablet 3  . HYDROcodone-acetaminophen (NORCO/VICODIN) 5-325 MG tablet Take 0.5-1 tablets by mouth every 4 (four) hours as needed for severe pain. 20 tablet 0  . metFORMIN (GLUCOPHAGE) 500 MG tablet Take 500 mg by mouth daily with breakfast.     . spironolactone (ALDACTONE) 25 MG tablet Take 0.5 tablets (12.5 mg total) by mouth daily. 15 tablet 6  . vitamin C (ASCORBIC ACID) 250 MG tablet Take 250 mg by mouth daily. Veggie cap with 250mg  vitamin C and 250mg  of cayenne    . ACCU-CHEK SMARTVIEW test strip     . acetaminophen (TYLENOL) 325 MG tablet Take 2 tablets (650 mg total) by mouth every 4 (four) hours as needed for headache or mild pain.    . hydrocortisone (PROCTO-PAK) 1 % CREA Apply 1 application topically daily. 1 Tube 0  . mupirocin ointment (BACTROBAN) 2 % Place into the nose 2 (two) times daily. 22 g 0  . nitroGLYCERIN (NITROSTAT) 0.4 MG SL tablet Place 1 tablet (0.4 mg total) under the tongue every 5 (five) minutes x 3 doses as needed for chest pain. 25 tablet 3   No current facility-administered medications for this encounter.    BP 110/88 (BP Location: Right Arm, Patient Position: Sitting, Cuff Size: Normal)    Pulse (!) 138   Wt 123 lb 6.4 oz (56 kg)   SpO2 98%   BMI 22.57 kg/m  General: NAD Neck: JVP 8-9 cm, no thyromegaly or thyroid nodule.  Lungs: Clear to auscultation bilaterally with normal respiratory effort. CV: Nondisplaced PMI.  Heart irregular S1/S2, no S3/S4, 2/6 HSM LLSB.  Trace ankle edema bilaterally.  No carotid bruit.  Normal pedal pulses.  Abdomen: Soft, nontender, no hepatosplenomegaly, mild distention.  Skin: Intact without lesions or rashes.  Neurologic: Alert and oriented x 3.  Psych: Normal affect. Extremities: No clubbing or cyanosis.  HEENT: Normal.   Assessment/Plan: 1. Chronic systolic CHF: EF 68-34% on 7/17 echo, 30-35%  on 1/18 echo.  Nonischemic cardiomyopathy, possible role for tachycardia-mediated cardiomyopathy (some improvement in EF with rate control but not back to normal). She is in atrial fibrillation with mild RVR today. NYHA class II-III, she appears to be somewhat volume overloaded, likely triggered by recurrent atrial fibrillation.  Weight is up.  - Increase Lasix to 40 daily alternating with 20 mg daily.  BMET today and BMET in 10 days.  - Continue digoxin, check level.   - Unable to tolerate losartan at even low dose.  - Increase bisoprolol to 5 mg daily.  - Continue spironolactone 12.5 daily.  2. Atrial fibrillation/flutter:  Need to keep in NSR or at least rate-controlled given concern for tachy-mediated cardiomyopathy.  She had suspected amiodarone-induced lung toxicity so ranolazine was started.  She did not tolerate ranolazine due to nausea.  Today, she is in atrial fibrillation with mild RVR.  As above, she tolerates atrial fibrillation poorly and it may have triggered a mild (at this point) CHF exacerbation.  - Continue apixaban 2.5 mg bid. CBC today.  - Increase bisoprolol to help decrease HR.  - Would ideally keep her in NSR. She cannot take amiodarone or ranolazine.  I do not think that she will hold NSR long-term after DCCV without an  anti-arrhythmic.  I am going to refer her to the atrial fibrillation clinic to arrange for Tikosyn initiation (her renal function may make her a marginal candidate for this).  Option after Tikosyn would be AV nodal ablation and pacing.  3. CAD: Patent OM stent on coronary angiography in 7/17.  Continue statin. No ASA with warfarin use.  Good lipids in 1/18.  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  Off amiodarone.  Follows with pulmonary.   [name] 09/04/2016

## 2016-09-07 DIAGNOSIS — I2510 Atherosclerotic heart disease of native coronary artery without angina pectoris: Secondary | ICD-10-CM | POA: Diagnosis not present

## 2016-09-07 DIAGNOSIS — R0602 Shortness of breath: Secondary | ICD-10-CM | POA: Diagnosis not present

## 2016-09-07 DIAGNOSIS — M6281 Muscle weakness (generalized): Secondary | ICD-10-CM | POA: Diagnosis not present

## 2016-09-07 DIAGNOSIS — I5021 Acute systolic (congestive) heart failure: Secondary | ICD-10-CM | POA: Diagnosis not present

## 2016-09-07 DIAGNOSIS — J9601 Acute respiratory failure with hypoxia: Secondary | ICD-10-CM | POA: Diagnosis not present

## 2016-09-07 DIAGNOSIS — R269 Unspecified abnormalities of gait and mobility: Secondary | ICD-10-CM | POA: Diagnosis not present

## 2016-09-07 DIAGNOSIS — R1312 Dysphagia, oropharyngeal phase: Secondary | ICD-10-CM | POA: Diagnosis not present

## 2016-09-07 DIAGNOSIS — Z5189 Encounter for other specified aftercare: Secondary | ICD-10-CM | POA: Diagnosis not present

## 2016-09-14 ENCOUNTER — Encounter (HOSPITAL_COMMUNITY): Payer: Self-pay | Admitting: Nurse Practitioner

## 2016-09-14 ENCOUNTER — Ambulatory Visit (HOSPITAL_COMMUNITY)
Admission: RE | Admit: 2016-09-14 | Discharge: 2016-09-14 | Disposition: A | Discharge status: Home / Self Care | Payer: Medicare HMO | Source: Ambulatory Visit | Attending: Nurse Practitioner | Admitting: Nurse Practitioner

## 2016-09-14 ENCOUNTER — Other Ambulatory Visit (HOSPITAL_COMMUNITY): Payer: Medicare HMO

## 2016-09-14 VITALS — BP 108/76 | HR 93 | Ht 62.0 in | Wt 122.0 lb

## 2016-09-14 DIAGNOSIS — Z8249 Family history of ischemic heart disease and other diseases of the circulatory system: Secondary | ICD-10-CM | POA: Diagnosis not present

## 2016-09-14 DIAGNOSIS — Z881 Allergy status to other antibiotic agents status: Secondary | ICD-10-CM | POA: Diagnosis not present

## 2016-09-14 DIAGNOSIS — G4730 Sleep apnea, unspecified: Secondary | ICD-10-CM | POA: Insufficient documentation

## 2016-09-14 DIAGNOSIS — M1990 Unspecified osteoarthritis, unspecified site: Secondary | ICD-10-CM | POA: Insufficient documentation

## 2016-09-14 DIAGNOSIS — I5022 Chronic systolic (congestive) heart failure: Secondary | ICD-10-CM | POA: Diagnosis not present

## 2016-09-14 DIAGNOSIS — E119 Type 2 diabetes mellitus without complications: Secondary | ICD-10-CM | POA: Diagnosis not present

## 2016-09-14 DIAGNOSIS — Z7984 Long term (current) use of oral hypoglycemic drugs: Secondary | ICD-10-CM | POA: Diagnosis not present

## 2016-09-14 DIAGNOSIS — E785 Hyperlipidemia, unspecified: Secondary | ICD-10-CM | POA: Insufficient documentation

## 2016-09-14 DIAGNOSIS — I480 Paroxysmal atrial fibrillation: Secondary | ICD-10-CM

## 2016-09-14 DIAGNOSIS — Z880 Allergy status to penicillin: Secondary | ICD-10-CM | POA: Insufficient documentation

## 2016-09-14 DIAGNOSIS — Z79899 Other long term (current) drug therapy: Secondary | ICD-10-CM | POA: Insufficient documentation

## 2016-09-14 DIAGNOSIS — Z955 Presence of coronary angioplasty implant and graft: Secondary | ICD-10-CM | POA: Insufficient documentation

## 2016-09-14 DIAGNOSIS — Z87891 Personal history of nicotine dependence: Secondary | ICD-10-CM | POA: Diagnosis not present

## 2016-09-14 DIAGNOSIS — Z7901 Long term (current) use of anticoagulants: Secondary | ICD-10-CM | POA: Diagnosis not present

## 2016-09-14 DIAGNOSIS — Z823 Family history of stroke: Secondary | ICD-10-CM | POA: Insufficient documentation

## 2016-09-14 DIAGNOSIS — Z8673 Personal history of transient ischemic attack (TIA), and cerebral infarction without residual deficits: Secondary | ICD-10-CM | POA: Insufficient documentation

## 2016-09-14 DIAGNOSIS — I4891 Unspecified atrial fibrillation: Secondary | ICD-10-CM | POA: Diagnosis present

## 2016-09-14 DIAGNOSIS — Z885 Allergy status to narcotic agent status: Secondary | ICD-10-CM | POA: Insufficient documentation

## 2016-09-14 DIAGNOSIS — I252 Old myocardial infarction: Secondary | ICD-10-CM | POA: Diagnosis not present

## 2016-09-14 DIAGNOSIS — I2510 Atherosclerotic heart disease of native coronary artery without angina pectoris: Secondary | ICD-10-CM | POA: Insufficient documentation

## 2016-09-14 DIAGNOSIS — I481 Persistent atrial fibrillation: Secondary | ICD-10-CM | POA: Insufficient documentation

## 2016-09-14 NOTE — Progress Notes (Signed)
Primary Care Physician: Shannon Pao, MD Referring Physician: Dr. [name] is a 83 y.o. female with a h/o  paroxysmal atrial fibrillation, chronic systolic CHF, and CAD presents for evaluation in the afib clinic for possible admission for tkosyn. . She apparently had been in persistent atrial fibrillation since around 8/16.  It appears that she was tachycardic most of the time prior to her recent admission in 7/17.  She had been on amiodarone prior to admission but it was not keeping her in NSR.  She was admitted with acute systolic CHF.  EF had dropped to 20-25% in 7/17 from 50-55% in 10/16.  She was in atrial fibrillation with RVR.  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  Amiodarone was stopped due to suspicion for amiodarone-induced lung toxicity (she was seen by pulmonary).  She cardioverted to NSR, unfortunately shortly after cardioversion returned to afib. Ranolazine was started to try to keep her in NSR.  HR was low in the hospital so she has not been on a beta blocker.   After discharge, she developed persistent nausea.  Eventually, it was found that the nausea was likely from ranolazine, so this medication was stopped and the nausea resolved.    She recently saw Dr. [name] and he wanted her to be considered for Tikosyn. Unfortunately, her crcl cal is only 23 and she would only be eligible for 125 mcg bid, which may not be enough drug to keep her in rhythm. Dr. [name] did see pt on consult in the hospital in July and he felt that her chances to restore/maintain SR were low.He said he would consider her for a biv pacemaker/AV nodal ablation if she persisted in afib.  Pt states since her nausea resolved(ranexa stopped) around Thanksgiving, she feels so much better and can eat normally again. Her weight is stable and she is able to pace herself with her activities and not be short of breath.  Today, she denies symptoms of  palpitations, chest pain, orthopnea, PND, lower extremity edema, dizziness, presyncope, syncope, or neurologic sequela. Controlled shortness of breath with exertions.The patient is tolerating medications without difficulties and is otherwise without complaint today.   Past Medical History:  Diagnosis Date  . Arthritis   . Baker's cyst of knee   . CAD (coronary artery disease)    a. 05/2006 Cath/PCI: LAD 22m, D1 40 ost, LCX nl, OM1 95 (3.0x20 Taxus DES), RCA nl, EF 40% w/ lat AK;  04/2007 Ex MV: minimal lat ischemia in area of prior infarct->low risk-> med Rx.  . Chronic atrial fibrillation (HCC)    a. on Coumadin  . Chronic systolic (congestive) heart failure    a. 04/2015: EF 50-55% b. echo 12/2015: EF 20-25% w/ diffuse HK, biatrial enlargement, mild AI and MR, severe TR    . Diabetes mellitus   . Hyperlipidemia   . MVP (mitral valve prolapse)    a. 10/2003 Echo: nl LV fxn, mild MR/TR/AS  . Myocardial infarction   . Sleep apnea   . Transient ischemic attack    Past Surgical History:  Procedure Laterality Date  . ATRIAL FLUTTER ABLATION N/A 10/09/2012   Procedure: ATRIAL FLUTTER ABLATION;  Surgeon: Thompson Grayer, MD;  Location: [facility] CATH LAB;  Service: Cardiovascular;  Laterality: N/A;  . CARDIAC CATHETERIZATION N/A 01/06/2016   Procedure: Right/Left Heart Cath and Coronary Angiography;  Surgeon: Belva Crome, MD;  Location: [facility] CV LAB;  Service: Cardiovascular;  Laterality: N/A;  .  CARDIOVERSION N/A 01/11/2016   Procedure: CARDIOVERSION;  Surgeon: Larey Dresser, MD;  Location: [facility] ENDOSCOPY;  Service: Cardiovascular;  Laterality: N/A;  . cataracts    . COLONOSCOPY N/A 10/13/2015   Procedure: COLONOSCOPY;  Surgeon: Manus Gunning, MD;  Location: [facility] ENDOSCOPY;  Service: Gastroenterology;  Laterality: N/A;  . CORONARY ANGIOPLASTY     Status post PTCA and stenting of the first obtuse marginal-05/13/2006. We placed a 3.0 x 20 mm Taxus stent. It was post dilated using a 3.25 mm  noncompliant balloon up to 14 atmospheres  . TONSILLECTOMY      Current Outpatient Prescriptions  Medication Sig Dispense Refill  . ACCU-CHEK SMARTVIEW test strip     . acetaminophen (TYLENOL) 325 MG tablet Take 2 tablets (650 mg total) by mouth every 4 (four) hours as needed for headache or mild pain.    [name] apixaban (ELIQUIS) 2.5 MG TABS tablet Take 1 tablet (2.5 mg total) by mouth 2 (two) times daily. 180 tablet 3  . atorvastatin (LIPITOR) 20 MG tablet Take 1 tablet (20 mg total) by mouth daily. Please call and schedule a 3 month follow up appointment with Dr Acie Fredrickson per last office visit 90 tablet 2  . bisoprolol (ZEBETA) 5 MG tablet Take 1 tablet (5 mg total) by mouth daily. 30 tablet 6  . digoxin (LANOXIN) 0.125 MG tablet Take 0.5 tablets (0.0625 mg total) by mouth every other day. 45 tablet 2  . furosemide (LASIX) 20 MG tablet Take 20 mg (1 tab) once daily alternating with 40 mg (2 tabs) the next day continuously. 90 tablet 3  . HYDROcodone-acetaminophen (NORCO/VICODIN) 5-325 MG tablet Take 0.5-1 tablets by mouth every 4 (four) hours as needed for severe pain. 20 tablet 0  . hydrocortisone (PROCTO-PAK) 1 % CREA Apply 1 application topically daily. 1 Tube 0  . metFORMIN (GLUCOPHAGE) 500 MG tablet Take 500 mg by mouth daily with breakfast.     . mupirocin ointment (BACTROBAN) 2 % Place into the nose 2 (two) times daily. 22 g 0  . nitroGLYCERIN (NITROSTAT) 0.4 MG SL tablet Place 1 tablet (0.4 mg total) under the tongue every 5 (five) minutes x 3 doses as needed for chest pain. 25 tablet 3  . spironolactone (ALDACTONE) 25 MG tablet Take 0.5 tablets (12.5 mg total) by mouth daily. 15 tablet 6  . vitamin C (ASCORBIC ACID) 250 MG tablet Take 250 mg by mouth daily. Veggie cap with 250mg  vitamin C and 250mg  of cayenne     No current facility-administered medications for this encounter.     Allergies  Allergen Reactions  . Demerol Other (See Comments)    Burning sensation all over body  .  Erythromycin Nausea And Vomiting  . Penicillins Other (See Comments)    unknown  . Percocet [Oxycodone-Acetaminophen] Swelling    Social History   Social History  . Marital status: Married    Spouse name: N/A  . Number of children: N/A  . Years of education: N/A   Occupational History  . Not on file.   Social History Main Topics  . Smoking status: Former Smoker    Packs/day: 1.00    Types: Cigarettes    Quit date: 07/03/1963  . Smokeless tobacco: Never Used  . Alcohol use No  . Drug use: No  . Sexual activity: No   Other Topics Concern  . Not on file   Social History Narrative   Lives in [place] with husband.  Does not routinely exercise but is able  to keep house and go food shopping.    Family History  Problem Relation Age of Onset  . Heart attack Mother   . Stroke Mother   . Heart attack Father   . Heart attack Brother   . Coronary artery disease Sister     CABG  . Alzheimer's disease Sister     X3  . Stroke Sister   . Heart attack Sister     ROS- All systems are reviewed and negative except as per the HPI above  Physical Exam: Vitals:   09/14/16 1059  BP: 108/76  Pulse: 93  Weight: 122 lb (55.3 kg)  Height: 5\' 2"  (1.575 m)   Wt Readings from Last 3 Encounters:  09/14/16 122 lb (55.3 kg)  09/04/16 123 lb 6.4 oz (56 kg)  07/24/16 118 lb 12.8 oz (53.9 kg)    Labs: Lab Results  Component Value Date   NA 138 09/04/2016   K 4.5 09/04/2016   CL 106 09/04/2016   CO2 23 09/04/2016   GLUCOSE 112 (H) 09/04/2016   BUN 48 (H) 09/04/2016   CREATININE 1.58 (H) 09/04/2016   CALCIUM 9.7 09/04/2016   PHOS 3.9 01/10/2015   MG 2.1 09/04/2016   Lab Results  Component Value Date   INR 2.1 04/19/2016   Lab Results  Component Value Date   CHOL 119 07/24/2016   HDL 63 07/24/2016   LDLCALC 42 07/24/2016   TRIG 70 07/24/2016     GEN- The patient is well appearing, alert and oriented x 3 today.   Head- normocephalic, atraumatic Eyes-  Sclera clear,  conjunctiva pink Ears- hearing intact Oropharynx- clear Neck- supple, no JVP Lymph- no cervical lymphadenopathy Lungs- Clear to ausculation bilaterally, normal work of breathing Heart- irregular rate and rhythm, no murmurs, rubs or gallops, PMI not laterally displaced GI- soft, NT, ND, + BS Extremities- no clubbing, cyanosis, or edema MS- no significant deformity or atrophy Skin- no rash or lesion Psych- euthymic mood, full affect Neuro- strength and sensation are intact  EKG-afib at 93 bpm, qrs int 106 ms, qtc 445 ms Echo-LV EF: 30% -   35%  ------------------------------------------------------------------- History:   PMH:  CHF.  Transient ischemic attack.  Risk factors: Diabetes mellitus.  ------------------------------------------------------------------- Study Conclusions  - Left ventricle: The cavity size was normal. Wall thickness was   increased in a pattern of mild LVH. Indeterminant diastolic   function (atrial fibrillation). Systolic function was moderately   to severely reduced. The estimated ejection fraction was in the   range of 30% to 35%. Diffuse hypokinesis. The study is not   technically sufficient to allow evaluation of LV diastolic   function. - Aortic valve: There was no stenosis. There was trivial   regurgitation. - Mitral valve: Thickened mitral leaflets with a hockeystick   appearing AMVL. Mildly calcified annulus. There was moderate   regurgitation. - Left atrium: The atrium was mildly dilated. - Right ventricle: The cavity size was normal. Systolic function   was mildly reduced. - Right atrium: The atrium was mildly dilated. - Atrial septum: There was a small PFO noted by color doppler. - Tricuspid valve: There was moderate regurgitation. - Pulmonary arteries: PA peak pressure: 53 mm Hg (S). - Systemic veins: IVC measured 2.0 cm with < 50% respirophasic   variation, suggesting RA pressure 8 mmHg.  Impressions:  - The patient was in  atrial fibrillation. Normal LV size with EF   30-35%, diffuse hypokinesis. Normal RV size with mildly decreased  systolic function. Biatrial enlargement. Moderate mitral   regurgitation. Moderate tricuspid regurgitation. Moderate   pulmonary hypertension.    Assessment and Plan:  1. Persistent afib Currently minimally symptomatic Failed amiodarone for suspected toxicity Not a great candidate for tikosyn with crcl cal at 23 Is currently rate controlled  And feels well today D. Shannon [name] would like to keep her in SR or good rate control for concern of tachy-mediated cardiomyopathy  2. Chronic systolic CHF Wt stable Continue furosemide  Daily weight  Avoid salt  Will refer to Dr. [name] for consideration for biv pacemaker/av nodal ablation   Shannon [name], [facility] [address] [place], [place] [address] [phone]

## 2016-09-21 ENCOUNTER — Ambulatory Visit (INDEPENDENT_AMBULATORY_CARE_PROVIDER_SITE_OTHER): Payer: Medicare HMO | Admitting: Internal Medicine

## 2016-09-21 ENCOUNTER — Encounter: Payer: Self-pay | Admitting: Internal Medicine

## 2016-09-21 ENCOUNTER — Encounter: Payer: Self-pay | Admitting: *Deleted

## 2016-09-21 VITALS — BP 102/64 | HR 102 | Ht 62.0 in | Wt 123.0 lb

## 2016-09-21 DIAGNOSIS — Z01812 Encounter for preprocedural laboratory examination: Secondary | ICD-10-CM | POA: Diagnosis not present

## 2016-09-21 DIAGNOSIS — I5022 Chronic systolic (congestive) heart failure: Secondary | ICD-10-CM

## 2016-09-21 DIAGNOSIS — I482 Chronic atrial fibrillation, unspecified: Secondary | ICD-10-CM

## 2016-09-21 DIAGNOSIS — I4821 Permanent atrial fibrillation: Secondary | ICD-10-CM

## 2016-09-21 DIAGNOSIS — I428 Other cardiomyopathies: Secondary | ICD-10-CM

## 2016-09-21 LAB — CBC WITH DIFFERENTIAL/PLATELET
BASOS: 0 %
Basophils Absolute: 0 10*3/uL (ref 0.0–0.2)
EOS (ABSOLUTE): 0.1 10*3/uL (ref 0.0–0.4)
Eos: 1 %
Hematocrit: 32.8 % — ABNORMAL LOW (ref 34.0–46.6)
Hemoglobin: 10.7 g/dL — ABNORMAL LOW (ref 11.1–15.9)
Immature Grans (Abs): 0 10*3/uL (ref 0.0–0.1)
Immature Granulocytes: 0 %
LYMPHS ABS: 1.3 10*3/uL (ref 0.7–3.1)
Lymphs: 15 %
MCH: 32.5 pg (ref 26.6–33.0)
MCHC: 32.6 g/dL (ref 31.5–35.7)
MCV: 100 fL — AB (ref 79–97)
MONOS ABS: 1 10*3/uL — AB (ref 0.1–0.9)
Monocytes: 12 %
Neutrophils Absolute: 5.9 10*3/uL (ref 1.4–7.0)
Neutrophils: 72 %
PLATELETS: 186 10*3/uL (ref 150–379)
RBC: 3.29 x10E6/uL — ABNORMAL LOW (ref 3.77–5.28)
RDW: 15.3 % (ref 12.3–15.4)
WBC: 8.3 10*3/uL (ref 3.4–10.8)

## 2016-09-21 LAB — BASIC METABOLIC PANEL
BUN/Creatinine Ratio: 29 — ABNORMAL HIGH (ref 12–28)
BUN: 38 mg/dL — ABNORMAL HIGH (ref 8–27)
CO2: 25 mmol/L (ref 18–29)
Calcium: 9.5 mg/dL (ref 8.7–10.3)
Chloride: 99 mmol/L (ref 96–106)
Creatinine, Ser: 1.29 mg/dL — ABNORMAL HIGH (ref 0.57–1.00)
GFR calc Af Amer: 45 mL/min/{1.73_m2} — ABNORMAL LOW (ref >59)
GFR, EST NON AFRICAN AMERICAN: 39 mL/min/{1.73_m2} — AB (ref >59)
Glucose: 113 mg/dL — ABNORMAL HIGH (ref 65–99)
POTASSIUM: 4.1 mmol/L (ref 3.5–5.2)
SODIUM: 140 mmol/L (ref 134–144)

## 2016-09-21 NOTE — Patient Instructions (Signed)
Medication Instructions: - Your physician recommends that you continue on your current medications as directed. Please refer to the Current Medication list given to you today.  Labwork: - Your physician recommends that you have lab work today: BMP/CBC   Procedures/Testing: - Your physician has recommended that you have an ablation. Catheter ablation is a medical procedure used to treat some cardiac arrhythmias (irregular heartbeats). During catheter ablation, a long, thin, flexible tube is put into a blood vessel in your groin (upper thigh), or neck. This tube is called an ablation catheter. It is then guided to your heart through the blood vessel. Radio frequency waves destroy small areas of heart tissue where abnormal heartbeats may cause an arrhythmia to start. Please see the instruction sheet given to you today.  - Your physician has recommended that you have a pacemaker inserted. A pacemaker is a small device that is placed under the skin of your chest or abdomen to help control abnormal heart rhythms. This device uses electrical pulses to prompt the heart to beat at a normal rate. Pacemakers are used to treat heart rhythms that are too slow. Wire (leads) are attached to the pacemaker that goes into the chambers of you heart. This is done in the hospital and usually requires and overnight stay. Please see the instruction sheet given to you today for more information.   Follow-Up: - Your physician recommends that you schedule a follow-up appointment in: 10-14 days (from 09/27/16) with the [facility] for a wound check   Any Additional Special Instructions Will Be Listed Below (If Applicable).     If you need a refill on your cardiac medications before your next appointment, please call your pharmacy.

## 2016-09-21 NOTE — Progress Notes (Signed)
Electrophysiology Office Note   Date:  09/21/2016   ID:  Shannon [name], DOB 1931-10-20, MRN [id number]  PCP:  Haywood Pao, MD  Cardiologist:  Dr [name] Primary Electrophysiologist: Thompson Grayer, MD    Chief Complaint  Patient presents with  . Paroxysmal atrial fibrillation (HCC)     History of Present Illness: Shannon [name] is a 83 y.o. female who presents today for electrophysiology evaluation.   I am seeing her today in consultation regarding afib management strategies.  I have seen her previously (2014) as well as during a hospitalization 01/09/16.  Those note are reviewed.  She has more recently been seen by Dr [name] and also Butch Penny in the AF clinic.  She has medicine refractory atrial arrhythmias.  I took her to the EP lab in 2014 (that note also reviewed today) and did not feel that left atrial ablation was feasible.  She has developed progressive reduction in her EF with hospitalization for CHF previously.  There is concern for tachycardia as the cause.  Rate control has been difficult.  Today, she denies symptoms of palpitations, chest pain, shortness of breath, orthopnea, PND, lower extremity edema, claudication, dizziness, presyncope, syncope, bleeding, or neurologic sequela. The patient is tolerating medications without difficulties and is otherwise without complaint today.    Past Medical History:  Diagnosis Date  . Arthritis   . Baker's cyst of knee   . CAD (coronary artery disease)    a. 05/2006 Cath/PCI: LAD 50m, D1 40 ost, LCX nl, OM1 95 (3.0x20 Taxus DES), RCA nl, EF 40% w/ lat AK;  04/2007 Ex MV: minimal lat ischemia in area of prior infarct->low risk-> med Rx.  . Chronic atrial fibrillation (HCC)    a. on Coumadin  . Chronic systolic (congestive) heart failure    a. 04/2015: EF 50-55% b. echo 12/2015: EF 20-25% w/ diffuse HK, biatrial enlargement, mild AI and MR, severe TR    . Diabetes mellitus   . Hyperlipidemia   . MVP (mitral valve prolapse)     a. 10/2003 Echo: nl LV fxn, mild MR/TR/AS  . Myocardial infarction   . Sleep apnea   . Transient ischemic attack    Past Surgical History:  Procedure Laterality Date  . ATRIAL FLUTTER ABLATION N/A 10/09/2012   Procedure: ATRIAL FLUTTER ABLATION;  Surgeon: Thompson Grayer, MD;  Location: [facility] CATH LAB;  Service: Cardiovascular;  Laterality: N/A;  . CARDIAC CATHETERIZATION N/A 01/06/2016   Procedure: Right/Left Heart Cath and Coronary Angiography;  Surgeon: Belva Crome, MD;  Location: [place] CV LAB;  Service: Cardiovascular;  Laterality: N/A;  . CARDIOVERSION N/A 01/11/2016   Procedure: CARDIOVERSION;  Surgeon: Larey Dresser, MD;  Location: [facility] ENDOSCOPY;  Service: Cardiovascular;  Laterality: N/A;  . cataracts    . COLONOSCOPY N/A 10/13/2015   Procedure: COLONOSCOPY;  Surgeon: Manus Gunning, MD;  Location: [facility] ENDOSCOPY;  Service: Gastroenterology;  Laterality: N/A;  . CORONARY ANGIOPLASTY     Status post PTCA and stenting of the first obtuse marginal-05/13/2006. We placed a 3.0 x 20 mm Taxus stent. It was post dilated using a 3.25 mm noncompliant balloon up to 14 atmospheres  . TONSILLECTOMY       Current Outpatient Prescriptions  Medication Sig Dispense Refill  . ACCU-CHEK SMARTVIEW test strip     . acetaminophen (TYLENOL) 325 MG tablet Take 2 tablets (650 mg total) by mouth every 4 (four) hours as needed for headache or mild pain.    [name] apixaban (ELIQUIS)  2.5 MG TABS tablet Take 1 tablet (2.5 mg total) by mouth 2 (two) times daily. 180 tablet 3  . atorvastatin (LIPITOR) 20 MG tablet Take 1 tablet (20 mg total) by mouth daily. Please call and schedule a 3 month follow up appointment with Dr Acie Fredrickson per last office visit 90 tablet 2  . bisoprolol (ZEBETA) 5 MG tablet Take 1 tablet (5 mg total) by mouth daily. 30 tablet 6  . digoxin (LANOXIN) 0.125 MG tablet Take 0.5 tablets (0.0625 mg total) by mouth every other day. 45 tablet 2  . furosemide (LASIX) 20 MG tablet Take 20 mg (1  tab) once daily alternating with 40 mg (2 tabs) the next day continuously. 90 tablet 3  . HYDROcodone-acetaminophen (NORCO/VICODIN) 5-325 MG tablet Take 0.5-1 tablets by mouth every 4 (four) hours as needed for severe pain. 20 tablet 0  . hydrocortisone (PROCTO-PAK) 1 % CREA Apply 1 application topically daily. 1 Tube 0  . metFORMIN (GLUCOPHAGE) 500 MG tablet Take 500 mg by mouth daily with breakfast.     . mupirocin ointment (BACTROBAN) 2 % Place into the nose 2 (two) times daily. 22 g 0  . nitroGLYCERIN (NITROSTAT) 0.4 MG SL tablet Place 1 tablet (0.4 mg total) under the tongue every 5 (five) minutes x 3 doses as needed for chest pain. 25 tablet 3  . spironolactone (ALDACTONE) 25 MG tablet Take 0.5 tablets (12.5 mg total) by mouth daily. 15 tablet 6  . vitamin C (ASCORBIC ACID) 250 MG tablet Take 250 mg by mouth daily. Veggie cap with 250mg  vitamin C and 250mg  of cayenne     No current facility-administered medications for this visit.     Allergies:   Demerol; Erythromycin; Penicillins; and Percocet [oxycodone-acetaminophen]   Social History:  The patient  reports that she quit smoking about 53 years ago. Her smoking use included Cigarettes. She smoked 1.00 pack per day. She has never used smokeless tobacco. She reports that she does not drink alcohol or use drugs.   Family History:  The patient's  family history includes Alzheimer's disease in her sister; Coronary artery disease in her sister; Heart attack in her brother, father, mother, and sister; Stroke in her mother and sister.    ROS:  Please see the history of present illness.   All other systems are personally reviewed and negative.    PHYSICAL EXAM: VS:  BP 102/64   Pulse (!) 102   Ht 5\' 2"  (1.575 m)   Wt 123 lb (55.8 kg)   SpO2 98%   BMI 22.50 kg/m  , BMI Body mass index is 22.5 kg/m. GEN: elderly and frail, in no acute distress  HEENT: normal  Neck: no JVD, carotid bruits, or masses Cardiac: tachycardic irregular  rhythm; no murmurs, rubs, or gallops,no edema  Respiratory:  clear to auscultation bilaterally, normal work of breathing GI: soft, nontender, nondistended, + BS MS: no deformity or atrophy  Skin: warm and dry  Neuro:  Strength and sensation are intact Psych: euthymic mood, full affect  EKG:  EKG is not ordered today. The ekgs in epic are personally reviewed.   Recent Labs: 10/09/2015: TSH 3.274 07/18/2016: ALT 14 09/04/2016: B Natriuretic Peptide 519.3; BUN 48; Creatinine, Ser 1.58; Hemoglobin 10.5; Magnesium 2.1; Platelets 196; Potassium 4.5; Sodium 138  personally reviewed   Lipid Panel     Component Value Date/Time   CHOL 119 07/24/2016 1526   TRIG 70 07/24/2016 1526   HDL 63 07/24/2016 1526   CHOLHDL 1.9 07/24/2016  1526   VLDL 14 07/24/2016 1526   LDLCALC 42 07/24/2016 1526   personally reviewed   Wt Readings from Last 3 Encounters:  09/21/16 123 lb (55.8 kg)  09/14/16 122 lb (55.3 kg)  09/04/16 123 lb 6.4 oz (56 kg)      Other studies personally reviewed: Additional studies/ records that were reviewed today include: Dr [name] and AF clinic notes are reviewed, prior echo, my prior notes  Review of the above records today demonstrates: as above   ASSESSMENT AND PLAN:  1.  Persistent afib/ atypical atrial flutter She has failed medical therapy with amiodarone.  Given low CrCl, Phyllis Ginger is not an option.  ranexa made her very sick.  She is a poor candidate for ablation. Therapeutic strategies for afib including rate and rhythm control were discussed in detail with the patient today. Risk, benefits, and alternatives to rate control with medicine, EP study and radiofrequency ablation for afib, and CRT-P placement with AV nodal ablation were discussed at length today.  She is very clear that she would prefer CRT-P with AV nodal ablation.    Risks, benefits, alternatives to BiV pacemaker implantation and AV nodal ablation were discussed in detail with the patient today. The  patient understands that the risks include but are not limited to bleeding, infection, pneumothorax, perforation, tamponade, vascular damage, renal failure, MI, stroke, death,  and lead dislodgement and wishes to proceed. We will therefore schedule the procedure at the next available time.    chads2vasc score is 8.  Continue eliquis long term.  2. 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   Current medicines are reviewed at length with the patient today.   The patient does not have concerns regarding her medicines.  The following changes were made today:  none  Labs/ tests ordered today include:  Orders Placed This Encounter  Procedures  . Basic Metabolic Panel (BMET)  . CBC with Differential/Platelet   Today, I have spent 40 minutes with the patient discussing options for AF management.  More than 50% of the visit time today was spent on this issue.    SignedThompson Grayer, MD  09/21/2016   [address] [place]  [place] [id number] [phone] (office) [phone] (fax)

## 2016-09-27 ENCOUNTER — Encounter (HOSPITAL_COMMUNITY)
Admission: RE | Disposition: A | Discharge status: Home / Self Care | Payer: Self-pay | Source: Ambulatory Visit | Attending: Internal Medicine

## 2016-09-27 ENCOUNTER — Ambulatory Visit (HOSPITAL_COMMUNITY)
Admission: RE | Admit: 2016-09-27 | Discharge: 2016-09-28 | Disposition: A | Discharge status: Home / Self Care | Payer: Medicare HMO | Source: Ambulatory Visit | Attending: Internal Medicine | Admitting: Internal Medicine

## 2016-09-27 DIAGNOSIS — Z8673 Personal history of transient ischemic attack (TIA), and cerebral infarction without residual deficits: Secondary | ICD-10-CM | POA: Diagnosis not present

## 2016-09-27 DIAGNOSIS — I252 Old myocardial infarction: Secondary | ICD-10-CM | POA: Diagnosis not present

## 2016-09-27 DIAGNOSIS — I480 Paroxysmal atrial fibrillation: Secondary | ICD-10-CM | POA: Diagnosis not present

## 2016-09-27 DIAGNOSIS — I482 Chronic atrial fibrillation: Secondary | ICD-10-CM | POA: Diagnosis not present

## 2016-09-27 DIAGNOSIS — Z8249 Family history of ischemic heart disease and other diseases of the circulatory system: Secondary | ICD-10-CM | POA: Diagnosis not present

## 2016-09-27 DIAGNOSIS — I5020 Unspecified systolic (congestive) heart failure: Secondary | ICD-10-CM | POA: Diagnosis present

## 2016-09-27 DIAGNOSIS — I341 Nonrheumatic mitral (valve) prolapse: Secondary | ICD-10-CM | POA: Diagnosis not present

## 2016-09-27 DIAGNOSIS — I481 Persistent atrial fibrillation: Secondary | ICD-10-CM | POA: Diagnosis not present

## 2016-09-27 DIAGNOSIS — Z87891 Personal history of nicotine dependence: Secondary | ICD-10-CM | POA: Diagnosis not present

## 2016-09-27 DIAGNOSIS — E119 Type 2 diabetes mellitus without complications: Secondary | ICD-10-CM | POA: Insufficient documentation

## 2016-09-27 DIAGNOSIS — Z880 Allergy status to penicillin: Secondary | ICD-10-CM | POA: Diagnosis not present

## 2016-09-27 DIAGNOSIS — I495 Sick sinus syndrome: Secondary | ICD-10-CM | POA: Diagnosis not present

## 2016-09-27 DIAGNOSIS — E785 Hyperlipidemia, unspecified: Secondary | ICD-10-CM | POA: Diagnosis not present

## 2016-09-27 DIAGNOSIS — I2510 Atherosclerotic heart disease of native coronary artery without angina pectoris: Secondary | ICD-10-CM | POA: Insufficient documentation

## 2016-09-27 DIAGNOSIS — Z7901 Long term (current) use of anticoagulants: Secondary | ICD-10-CM | POA: Insufficient documentation

## 2016-09-27 DIAGNOSIS — G4733 Obstructive sleep apnea (adult) (pediatric): Secondary | ICD-10-CM | POA: Insufficient documentation

## 2016-09-27 DIAGNOSIS — I429 Cardiomyopathy, unspecified: Secondary | ICD-10-CM | POA: Insufficient documentation

## 2016-09-27 DIAGNOSIS — Z7984 Long term (current) use of oral hypoglycemic drugs: Secondary | ICD-10-CM | POA: Diagnosis not present

## 2016-09-27 DIAGNOSIS — I5022 Chronic systolic (congestive) heart failure: Secondary | ICD-10-CM | POA: Diagnosis not present

## 2016-09-27 DIAGNOSIS — I442 Atrioventricular block, complete: Secondary | ICD-10-CM | POA: Diagnosis present

## 2016-09-27 DIAGNOSIS — Z79899 Other long term (current) drug therapy: Secondary | ICD-10-CM | POA: Insufficient documentation

## 2016-09-27 DIAGNOSIS — I484 Atypical atrial flutter: Secondary | ICD-10-CM | POA: Diagnosis not present

## 2016-09-27 DIAGNOSIS — I4819 Other persistent atrial fibrillation: Secondary | ICD-10-CM | POA: Diagnosis present

## 2016-09-27 DIAGNOSIS — R0602 Shortness of breath: Secondary | ICD-10-CM | POA: Diagnosis present

## 2016-09-27 DIAGNOSIS — Z955 Presence of coronary angioplasty implant and graft: Secondary | ICD-10-CM | POA: Diagnosis not present

## 2016-09-27 DIAGNOSIS — Z950 Presence of cardiac pacemaker: Secondary | ICD-10-CM

## 2016-09-27 DIAGNOSIS — Z959 Presence of cardiac and vascular implant and graft, unspecified: Secondary | ICD-10-CM

## 2016-09-27 HISTORY — PX: BIV PACEMAKER INSERTION CRT-P: EP1199

## 2016-09-27 HISTORY — PX: AV NODE ABLATION: EP1193

## 2016-09-27 LAB — SURGICAL PCR SCREEN
MRSA, PCR: NEGATIVE
Staphylococcus aureus: NEGATIVE

## 2016-09-27 LAB — GLUCOSE, CAPILLARY: Glucose-Capillary: 137 mg/dL — ABNORMAL HIGH (ref 65–99)

## 2016-09-27 SURGERY — AV NODE ABLATION

## 2016-09-27 MED ORDER — VANCOMYCIN HCL IN DEXTROSE 1-5 GM/200ML-% IV SOLN
1000.0000 mg | INTRAVENOUS | Status: AC
  Administered 2016-09-27: 1000 mg via INTRAVENOUS
  Filled 2016-09-27: qty 200

## 2016-09-27 MED ORDER — VANCOMYCIN HCL IN DEXTROSE 1-5 GM/200ML-% IV SOLN
1000.0000 mg | Freq: Two times a day (BID) | INTRAVENOUS | Status: AC
  Administered 2016-09-28: 1000 mg via INTRAVENOUS
  Filled 2016-09-27: qty 200

## 2016-09-27 MED ORDER — BUPIVACAINE HCL (PF) 0.25 % IJ SOLN
INTRAMUSCULAR | Status: DC | PRN
  Administered 2016-09-27: 20 mL

## 2016-09-27 MED ORDER — VANCOMYCIN HCL IN DEXTROSE 1-5 GM/200ML-% IV SOLN
INTRAVENOUS | Status: AC
  Filled 2016-09-27: qty 200

## 2016-09-27 MED ORDER — MIDAZOLAM HCL 5 MG/5ML IJ SOLN
INTRAMUSCULAR | Status: DC | PRN
  Administered 2016-09-27 (×3): 1 mg via INTRAVENOUS

## 2016-09-27 MED ORDER — SODIUM CHLORIDE 0.9 % IR SOLN
80.0000 mg | Status: AC
  Administered 2016-09-27: 80 mg
  Filled 2016-09-27: qty 2

## 2016-09-27 MED ORDER — SODIUM CHLORIDE 0.9 % IV SOLN
INTRAVENOUS | Status: DC
  Administered 2016-09-27: 14:00:00 via INTRAVENOUS

## 2016-09-27 MED ORDER — IOPAMIDOL (ISOVUE-370) INJECTION 76%
INTRAVENOUS | Status: AC
  Filled 2016-09-27: qty 50

## 2016-09-27 MED ORDER — ACETAMINOPHEN 325 MG PO TABS: 325.0000 mg | ORAL_TABLET | ORAL | Status: DC | PRN

## 2016-09-27 MED ORDER — FENTANYL CITRATE (PF) 100 MCG/2ML IJ SOLN
INTRAMUSCULAR | Status: DC | PRN
  Administered 2016-09-27 (×3): 12.5 ug via INTRAVENOUS

## 2016-09-27 MED ORDER — DIPHENHYDRAMINE HCL 50 MG/ML IJ SOLN
INTRAMUSCULAR | Status: DC | PRN
  Administered 2016-09-27: 50 mg via INTRAVENOUS

## 2016-09-27 MED ORDER — FUROSEMIDE 40 MG PO TABS: 40.0000 mg | ORAL_TABLET | Freq: Every day | ORAL | Status: DC

## 2016-09-27 MED ORDER — ONDANSETRON HCL 4 MG/2ML IJ SOLN: 4.0000 mg | Freq: Four times a day (QID) | INTRAMUSCULAR | Status: DC | PRN

## 2016-09-27 MED ORDER — SPIRONOLACTONE 12.5 MG HALF TABLET
12.5000 mg | ORAL_TABLET | Freq: Every day | ORAL | Status: DC
  Filled 2016-09-27: qty 1

## 2016-09-27 MED ORDER — SODIUM CHLORIDE 0.9% FLUSH: 3.0000 mL | Freq: Two times a day (BID) | INTRAVENOUS | Status: DC

## 2016-09-27 MED ORDER — FENTANYL CITRATE (PF) 100 MCG/2ML IJ SOLN
INTRAMUSCULAR | Status: AC
  Filled 2016-09-27: qty 2

## 2016-09-27 MED ORDER — MUPIROCIN 2 % EX OINT: 1.0000 "application " | TOPICAL_OINTMENT | Freq: Once | CUTANEOUS | Status: DC

## 2016-09-27 MED ORDER — METHYLPREDNISOLONE SODIUM SUCC 125 MG IJ SOLR
INTRAMUSCULAR | Status: AC
  Filled 2016-09-27: qty 2

## 2016-09-27 MED ORDER — MUPIROCIN 2 % EX OINT
TOPICAL_OINTMENT | CUTANEOUS | Status: AC
  Administered 2016-09-27: 1
  Filled 2016-09-27: qty 22

## 2016-09-27 MED ORDER — METHYLPREDNISOLONE SODIUM SUCC 125 MG IJ SOLR
INTRAMUSCULAR | Status: DC | PRN
  Administered 2016-09-27: 125 mg via INTRAVENOUS

## 2016-09-27 MED ORDER — DIPHENHYDRAMINE HCL 50 MG/ML IJ SOLN
INTRAMUSCULAR | Status: AC
Start: 2016-09-27 — End: 2016-09-27
  Filled 2016-09-27: qty 1

## 2016-09-27 MED ORDER — SODIUM CHLORIDE 0.9 % IR SOLN
Status: AC
  Filled 2016-09-27: qty 2

## 2016-09-27 MED ORDER — LIDOCAINE HCL (PF) 1 % IJ SOLN
INTRAMUSCULAR | Status: AC
  Filled 2016-09-27: qty 60

## 2016-09-27 MED ORDER — LIDOCAINE HCL (PF) 1 % IJ SOLN
INTRAMUSCULAR | Status: DC | PRN
  Administered 2016-09-27: 48 mL

## 2016-09-27 MED ORDER — SODIUM CHLORIDE 0.9 % IV SOLN: 250.0000 mL | INTRAVENOUS | Status: DC | PRN

## 2016-09-27 MED ORDER — IOPAMIDOL (ISOVUE-370) INJECTION 76%
INTRAVENOUS | Status: DC | PRN
  Administered 2016-09-27 (×2): 15 mL via INTRAVENOUS

## 2016-09-27 MED ORDER — BUPIVACAINE HCL (PF) 0.25 % IJ SOLN
INTRAMUSCULAR | Status: AC
  Filled 2016-09-27: qty 30

## 2016-09-27 MED ORDER — MIDAZOLAM HCL 5 MG/5ML IJ SOLN
INTRAMUSCULAR | Status: AC
  Filled 2016-09-27: qty 5

## 2016-09-27 MED ORDER — SODIUM CHLORIDE 0.9% FLUSH: 3.0000 mL | INTRAVENOUS | Status: DC | PRN

## 2016-09-27 MED ORDER — HEPARIN (PORCINE) IN NACL 2-0.9 UNIT/ML-% IJ SOLN
INTRAMUSCULAR | Status: DC | PRN
  Administered 2016-09-27: 1000 mL

## 2016-09-27 SURGICAL SUPPLY — 30 items
ADAPTER SEALING SSA-EW-09 (MISCELLANEOUS) ×6 IMPLANT
BALLN COR SINUS VENO 6F 80CM (BALLOONS) ×1
BALLN COR SINUS VENO 6FR 80 (BALLOONS) ×2
BALLOON COR SINUS VENO 6FR 80 (BALLOONS) ×1 IMPLANT
CABLE SURGICAL S-101-97-12 (CABLE) ×9 IMPLANT
CATH ATTAIN COM SURV 6250V-EH (CATHETERS) ×3 IMPLANT
CATH ATTAIN COM SURV 6250V-MB2 (CATHETERS) ×3 IMPLANT
CATH CELSIUS THERMO F CV 7FR (ABLATOR) ×3 IMPLANT
CATH HEXAPOLAR DAMATO 6F (CATHETERS) ×3 IMPLANT
CATH RIGHTSITE C315HIS02 (CATHETERS) ×3 IMPLANT
KIT ESSENTIALS PG (KITS) ×3 IMPLANT
LEAD CAPSURE NOVUS 5076-58CM (Lead) ×3 IMPLANT
LEAD SELECT SECURE 3830 383069 (Lead) ×1 IMPLANT
LEAD TENDRIL SDX 2088TC-52CM (Lead) ×3 IMPLANT
PACEMAKER ALLR CRT-P RF PM3222 (Pacemaker) ×1 IMPLANT
PACK EP LATEX FREE (CUSTOM PROCEDURE TRAY) ×2
PACK EP LF (CUSTOM PROCEDURE TRAY) ×1 IMPLANT
PAD DEFIB LIFELINK (PAD) ×3 IMPLANT
PPM ALLURE CRT-P RF PM3222 (Pacemaker) ×3 IMPLANT
SELECT SECURE 3830 383069 (Lead) ×3 IMPLANT
SET INTRODUCER MICROPUNCT 5F (INTRODUCER) ×3 IMPLANT
SHEATH CLASSIC 7F (SHEATH) ×6 IMPLANT
SHEATH CLASSIC 9.5F (SHEATH) ×3 IMPLANT
SHEATH PINNACLE 6F 10CM (SHEATH) ×3 IMPLANT
SHEATH PINNACLE 8F 10CM (SHEATH) ×3 IMPLANT
SLITTER 6232ADJ (MISCELLANEOUS) ×3 IMPLANT
TRAY PACEMAKER INSERTION (CUSTOM PROCEDURE TRAY) ×3 IMPLANT
WIRE ACUITY WHISPER EDS 4648 (WIRE) ×6 IMPLANT
WIRE HI TORQ VERSACORE-J 145CM (WIRE) ×3 IMPLANT
WIRE MAILMAN 182CM (WIRE) ×3 IMPLANT

## 2016-09-27 NOTE — Progress Notes (Signed)
Upon rolling patient from side to side to remove defib pad and grounding pads from patient's back, a blanchable red area on the patient's sacrum was noted.  Patient stated "that's a problem area for me."   A sacral foam dressing was applied to patient's sacral area.  Receiving RN getting report was notified.  Will document in flowsheets.

## 2016-09-27 NOTE — Progress Notes (Addendum)
Site area: RFV Site Prior to Removal:  Level 0 Pressure Applied For:20 min Manual:   yes Patient Status During Pull:  stable Post Pull Site:  Level 0 Post Pull Instructions Given:  yes Post Pull Pulses Present: palpable Dressing Applied:  tegaderm Bedrest begins @ 1930 Comments:

## 2016-09-27 NOTE — H&P (View-Only) (Signed)
Electrophysiology Office Note   Date:  09/21/2016   ID:  Shannon [name], DOB 02/07/1932, MRN [id number]  PCP:  Haywood Pao, MD  Cardiologist:  Dr [name] Primary Electrophysiologist: Thompson Grayer, MD    Chief Complaint  Patient presents with  . Paroxysmal atrial fibrillation (HCC)     History of Present Illness: Shannon [name] is a 83 y.o. female who presents today for electrophysiology evaluation.   I am seeing her today in consultation regarding afib management strategies.  I have seen her previously (2014) as well as during a hospitalization 01/09/16.  Those note are reviewed.  She has more recently been seen by Dr [name] and also Butch Penny in the AF clinic.  She has medicine refractory atrial arrhythmias.  I took her to the EP lab in 2014 (that note also reviewed today) and did not feel that left atrial ablation was feasible.  She has developed progressive reduction in her EF with hospitalization for CHF previously.  There is concern for tachycardia as the cause.  Rate control has been difficult.  Today, she denies symptoms of palpitations, chest pain, shortness of breath, orthopnea, PND, lower extremity edema, claudication, dizziness, presyncope, syncope, bleeding, or neurologic sequela. The patient is tolerating medications without difficulties and is otherwise without complaint today.    Past Medical History:  Diagnosis Date  . Arthritis   . Baker's cyst of knee   . CAD (coronary artery disease)    a. 05/2006 Cath/PCI: LAD 54m, D1 40 ost, LCX nl, OM1 95 (3.0x20 Taxus DES), RCA nl, EF 40% w/ lat AK;  04/2007 Ex MV: minimal lat ischemia in area of prior infarct->low risk-> med Rx.  . Chronic atrial fibrillation (HCC)    a. on Coumadin  . Chronic systolic (congestive) heart failure    a. 04/2015: EF 50-55% b. echo 12/2015: EF 20-25% w/ diffuse HK, biatrial enlargement, mild AI and MR, severe TR    . Diabetes mellitus   . Hyperlipidemia   . MVP (mitral valve prolapse)     a. 10/2003 Echo: nl LV fxn, mild MR/TR/AS  . Myocardial infarction   . Sleep apnea   . Transient ischemic attack    Past Surgical History:  Procedure Laterality Date  . ATRIAL FLUTTER ABLATION N/A 10/09/2012   Procedure: ATRIAL FLUTTER ABLATION;  Surgeon: Thompson Grayer, MD;  Location: [facility] CATH LAB;  Service: Cardiovascular;  Laterality: N/A;  . CARDIAC CATHETERIZATION N/A 01/06/2016   Procedure: Right/Left Heart Cath and Coronary Angiography;  Surgeon: Belva Crome, MD;  Location: [place] CV LAB;  Service: Cardiovascular;  Laterality: N/A;  . CARDIOVERSION N/A 01/11/2016   Procedure: CARDIOVERSION;  Surgeon: Larey Dresser, MD;  Location: [facility] ENDOSCOPY;  Service: Cardiovascular;  Laterality: N/A;  . cataracts    . COLONOSCOPY N/A 10/13/2015   Procedure: COLONOSCOPY;  Surgeon: Manus Gunning, MD;  Location: [facility] ENDOSCOPY;  Service: Gastroenterology;  Laterality: N/A;  . CORONARY ANGIOPLASTY     Status post PTCA and stenting of the first obtuse marginal-05/13/2006. We placed a 3.0 x 20 mm Taxus stent. It was post dilated using a 3.25 mm noncompliant balloon up to 14 atmospheres  . TONSILLECTOMY       Current Outpatient Prescriptions  Medication Sig Dispense Refill  . ACCU-CHEK SMARTVIEW test strip     . acetaminophen (TYLENOL) 325 MG tablet Take 2 tablets (650 mg total) by mouth every 4 (four) hours as needed for headache or mild pain.    [name] apixaban (ELIQUIS)  2.5 MG TABS tablet Take 1 tablet (2.5 mg total) by mouth 2 (two) times daily. 180 tablet 3  . atorvastatin (LIPITOR) 20 MG tablet Take 1 tablet (20 mg total) by mouth daily. Please call and schedule a 3 month follow up appointment with Dr Acie Fredrickson per last office visit 90 tablet 2  . bisoprolol (ZEBETA) 5 MG tablet Take 1 tablet (5 mg total) by mouth daily. 30 tablet 6  . digoxin (LANOXIN) 0.125 MG tablet Take 0.5 tablets (0.0625 mg total) by mouth every other day. 45 tablet 2  . furosemide (LASIX) 20 MG tablet Take 20 mg (1  tab) once daily alternating with 40 mg (2 tabs) the next day continuously. 90 tablet 3  . HYDROcodone-acetaminophen (NORCO/VICODIN) 5-325 MG tablet Take 0.5-1 tablets by mouth every 4 (four) hours as needed for severe pain. 20 tablet 0  . hydrocortisone (PROCTO-PAK) 1 % CREA Apply 1 application topically daily. 1 Tube 0  . metFORMIN (GLUCOPHAGE) 500 MG tablet Take 500 mg by mouth daily with breakfast.     . mupirocin ointment (BACTROBAN) 2 % Place into the nose 2 (two) times daily. 22 g 0  . nitroGLYCERIN (NITROSTAT) 0.4 MG SL tablet Place 1 tablet (0.4 mg total) under the tongue every 5 (five) minutes x 3 doses as needed for chest pain. 25 tablet 3  . spironolactone (ALDACTONE) 25 MG tablet Take 0.5 tablets (12.5 mg total) by mouth daily. 15 tablet 6  . vitamin C (ASCORBIC ACID) 250 MG tablet Take 250 mg by mouth daily. Veggie cap with 250mg  vitamin C and 250mg  of cayenne     No current facility-administered medications for this visit.     Allergies:   Demerol; Erythromycin; Penicillins; and Percocet [oxycodone-acetaminophen]   Social History:  The patient  reports that she quit smoking about 53 years ago. Her smoking use included Cigarettes. She smoked 1.00 pack per day. She has never used smokeless tobacco. She reports that she does not drink alcohol or use drugs.   Family History:  The patient's  family history includes Alzheimer's disease in her sister; Coronary artery disease in her sister; Heart attack in her brother, father, mother, and sister; Stroke in her mother and sister.    ROS:  Please see the history of present illness.   All other systems are personally reviewed and negative.    PHYSICAL EXAM: VS:  BP 102/64   Pulse (!) 102   Ht 5\' 2"  (1.575 m)   Wt 123 lb (55.8 kg)   SpO2 98%   BMI 22.50 kg/m  , BMI Body mass index is 22.5 kg/m. GEN: elderly and frail, in no acute distress  HEENT: normal  Neck: no JVD, carotid bruits, or masses Cardiac: tachycardic irregular  rhythm; no murmurs, rubs, or gallops,no edema  Respiratory:  clear to auscultation bilaterally, normal work of breathing GI: soft, nontender, nondistended, + BS MS: no deformity or atrophy  Skin: warm and dry  Neuro:  Strength and sensation are intact Psych: euthymic mood, full affect  EKG:  EKG is not ordered today. The ekgs in epic are personally reviewed.   Recent Labs: 10/09/2015: TSH 3.274 07/18/2016: ALT 14 09/04/2016: B Natriuretic Peptide 519.3; BUN 48; Creatinine, Ser 1.58; Hemoglobin 10.5; Magnesium 2.1; Platelets 196; Potassium 4.5; Sodium 138  personally reviewed   Lipid Panel     Component Value Date/Time   CHOL 119 07/24/2016 1526   TRIG 70 07/24/2016 1526   HDL 63 07/24/2016 1526   CHOLHDL 1.9 07/24/2016  1526   VLDL 14 07/24/2016 1526   LDLCALC 42 07/24/2016 1526   personally reviewed   Wt Readings from Last 3 Encounters:  09/21/16 123 lb (55.8 kg)  09/14/16 122 lb (55.3 kg)  09/04/16 123 lb 6.4 oz (56 kg)      Other studies personally reviewed: Additional studies/ records that were reviewed today include: Dr [name] and AF clinic notes are reviewed, prior echo, my prior notes  Review of the above records today demonstrates: as above   ASSESSMENT AND PLAN:  1.  Persistent afib/ atypical atrial flutter She has failed medical therapy with amiodarone.  Given low CrCl, Phyllis Ginger is not an option.  ranexa made her very sick.  She is a poor candidate for ablation. Therapeutic strategies for afib including rate and rhythm control were discussed in detail with the patient today. Risk, benefits, and alternatives to rate control with medicine, EP study and radiofrequency ablation for afib, and CRT-P placement with AV nodal ablation were discussed at length today.  She is very clear that she would prefer CRT-P with AV nodal ablation.    Risks, benefits, alternatives to BiV pacemaker implantation and AV nodal ablation were discussed in detail with the patient today. The  patient understands that the risks include but are not limited to bleeding, infection, pneumothorax, perforation, tamponade, vascular damage, renal failure, MI, stroke, death,  and lead dislodgement and wishes to proceed. We will therefore schedule the procedure at the next available time.    chads2vasc score is 8.  Continue eliquis long term.  2. 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   Current medicines are reviewed at length with the patient today.   The patient does not have concerns regarding her medicines.  The following changes were made today:  none  Labs/ tests ordered today include:  Orders Placed This Encounter  Procedures  . Basic Metabolic Panel (BMET)  . CBC with Differential/Platelet   Today, I have spent 40 minutes with the patient discussing options for AF management.  More than 50% of the visit time today was spent on this issue.    SignedThompson Grayer, MD  09/21/2016   [place] [phone] (office) [phone] (fax)

## 2016-09-27 NOTE — Interval H&P Note (Signed)
History and Physical Interval Note:  09/27/2016 2:13 PM  Shannon [name]  has presented today for surgery, with the diagnosis of afib  The various methods of treatment have been discussed with the patient and family. After consideration of risks, benefits and other options for treatment, the patient has consented to  Procedure(s): AV Node Ablation (N/A) BiV Pacemaker Insertion CRT-P (N/A) as a surgical intervention .  The patient's history has been reviewed, patient examined, no change in status, stable for surgery.  I have reviewed the patient's chart and labs.  Questions were answered to the patient's satisfaction.    I have spoken at length with patient and son.  Risks of procedure again discussed in detail.  She is aware of AV nodal ablation and potential for device dependence and wishes to proceed.   Thompson Grayer

## 2016-09-27 NOTE — Discharge Summary (Signed)
ELECTROPHYSIOLOGY PROCEDURE DISCHARGE SUMMARY    Patient ID: Shannon [name],  MRN: [id number], DOB/AGE: 1932/06/19 83 y.o.  Admit date: 09/27/2016 Discharge date: 09/28/2016  Primary Care Physician: Haywood Pao, MD Primary Cardiologist: [name] Electrophysiologist: Jerrilyn Messinger  Primary Discharge Diagnosis:  1.  Permanent atrial fibrillation 2.  Chronic systolic heart failure  Secondary Discharge Diagnosis: 1.  CAD 2.  Diabetes 3.  Severe TR 4.  Hyperlipidemia 5.  OSA 6.  TIA  Allergies  Allergen Reactions  . Amiodarone Anaphylaxis  . Ranexa [Ranolazine] Nausea And Vomiting  . Other Other (See Comments)    Vitamin E - causes heart rate to increase  . Demerol Other (See Comments)    Burning sensation all over body  . Erythromycin Nausea And Vomiting  . Penicillins Other (See Comments)    unknown  . Percocet [Oxycodone-Acetaminophen] Swelling    Procedures This Admission:  1.  Cardiac resynchronization therapy pacemaker implant and AVN ablation on 09/27/16 by Dr Rayann Heman. The patient received a STJ CRTP with model number 2088RA lead, 5076 RV lead, and 1027 His Pacing lead in  LV port.  She subsequently underwent AVN ablation.  There were no early apparent complications.   Brief HPI:  Shannon [name] is a 83 y.o. female with a past medical history as outlined above. She has failed medical therapy with amiodarone. She was seen by Dr Rayann Heman who discussed treatment options including AF ablation vs AVN ablation and CRTP.  The patient preferred to proceed with CRTP.    Hospital Course:  The patient was admitted and underwent STJ CRTP implant and AVN ablation with details as outlined above. She was monitored on telemetry overnight which demonstrated atrial fibrillation with ventricular pacing. Left chest was without hematoma or ecchymosis. Groin was without complication.  She was examined by Dr Rayann Heman and considered stable for discharge to home.   Physical  Exam: Vitals:   09/27/16 1928 09/27/16 1930 09/27/16 1952 09/28/16 0500  BP: 101/62 101/62 119/72 109/67  Pulse: 80 80 80 80  Resp: 12 13 16 18   Temp:   97.7 F (36.5 C) 97.1 F (36.2 C)  TempSrc:   Axillary Oral  SpO2: 94% 94% 98% 98%  Weight:    122 lb 6.4 oz (55.5 kg)  Height:        GEN- The patient is elderly and frail appearing, alert and oriented x 3 today.   HEENT: normocephalic, atraumatic; sclera clear, conjunctiva pink; hearing intact; oropharynx clear; neck supple, no JVP Lymph- no cervical lymphadenopathy Lungs- Clear to ausculation bilaterally, normal work of breathing.  No wheezes, rales, rhonchi Heart- Regular rate and rhythm (paced) GI- soft, non-tender, non-distended, bowel sounds present, no hepatosplenomegaly Extremities- no clubbing, cyanosis, or edema; DP/PT/radial pulses 2+ bilaterally MS-age appropriate muscle atrophy Skin- warm and dry, no rash or lesion, pacemaker pocket is without hematoma Psych- euthymic mood, full affect Neuro- strength and sensation are intact   Device interrogation is personally reviewed and normal  CXR is personally reviewed and reveals no PTX, stable leads  Labs:   Lab Results  Component Value Date   WBC 8.3 09/21/2016   HGB 10.5 (L) 09/04/2016   HCT 32.8 (L) 09/21/2016   MCV 100 (H) 09/21/2016   PLT 186 09/21/2016     Recent Labs Lab 09/28/16 0237  NA 138  K 4.1  CL 105  CO2 21*  BUN 36*  CREATININE 1.44*  CALCIUM 9.0  GLUCOSE 237*     Discharge Medications:  Current Discharge Medication List    CONTINUE these medications which have NOT CHANGED   Details  ACCU-CHEK SMARTVIEW test strip     apixaban (ELIQUIS) 2.5 MG TABS tablet Take 1 tablet (2.5 mg total) by mouth 2 (two) times daily. Qty: 180 tablet, Refills: 3    atorvastatin (LIPITOR) 20 MG tablet Take 1 tablet (20 mg total) by mouth daily. Please call and schedule a 3 month follow up appointment with Dr Acie Fredrickson per last office visit Qty: 90  tablet, Refills: 2    bisoprolol (ZEBETA) 5 MG tablet Take 1 tablet (5 mg total) by mouth daily. Qty: 30 tablet, Refills: 6    Cholecalciferol (VITAMIN D) 2000 units tablet Take 2,000 Units by mouth daily.    digoxin (LANOXIN) 0.125 MG tablet Take 0.5 tablets (0.0625 mg total) by mouth every other day. Qty: 45 tablet, Refills: 2    furosemide (LASIX) 20 MG tablet Take 20 mg (1 tab) once daily alternating with 40 mg (2 tabs) the next day continuously. Qty: 90 tablet, Refills: 3    metFORMIN (GLUCOPHAGE) 500 MG tablet Take 500 mg by mouth daily with supper.     spironolactone (ALDACTONE) 25 MG tablet Take 0.5 tablets (12.5 mg total) by mouth daily. Qty: 15 tablet, Refills: 6    vitamin C (ASCORBIC ACID) 250 MG tablet Take 250 mg by mouth daily. Veggie cap with 250mg  vitamin C and 250mg  of cayenne    acetaminophen (TYLENOL) 325 MG tablet Take 2 tablets (650 mg total) by mouth every 4 (four) hours as needed for headache or mild pain.    nitroGLYCERIN (NITROSTAT) 0.4 MG SL tablet Place 1 tablet (0.4 mg total) under the tongue every 5 (five) minutes x 3 doses as needed for chest pain. Qty: 25 tablet, Refills: 3        Disposition: Pt is being discharged home today in good condition.  Follow-up Information    [place] [facility] Follow up on 10/08/2016.   Specialty:  Cardiology Why:  at [facility] for wound check  Contact information: [address], [place]       Thompson Grayer, MD Follow up on 12/31/2016.   Specialty:  Cardiology Why:  at 9:45AM  Contact information: [place] [address] [place], NP Follow up on 11/14/2016.   Specialty:  Cardiology Why:  at [facility] information: [place] [address] [phone]           Duration of Discharge Encounter: Greater than 30 minutes including physician time.  Army Fossa  MD 09/28/2016 7:29 AM

## 2016-09-28 ENCOUNTER — Encounter (HOSPITAL_COMMUNITY): Payer: Self-pay | Admitting: Internal Medicine

## 2016-09-28 ENCOUNTER — Telehealth: Payer: Self-pay | Admitting: Internal Medicine

## 2016-09-28 ENCOUNTER — Ambulatory Visit (HOSPITAL_COMMUNITY): Payer: Medicare HMO

## 2016-09-28 DIAGNOSIS — I481 Persistent atrial fibrillation: Secondary | ICD-10-CM | POA: Diagnosis not present

## 2016-09-28 DIAGNOSIS — I442 Atrioventricular block, complete: Secondary | ICD-10-CM | POA: Diagnosis not present

## 2016-09-28 DIAGNOSIS — I482 Chronic atrial fibrillation: Secondary | ICD-10-CM | POA: Diagnosis not present

## 2016-09-28 DIAGNOSIS — Z950 Presence of cardiac pacemaker: Secondary | ICD-10-CM

## 2016-09-28 DIAGNOSIS — I420 Dilated cardiomyopathy: Secondary | ICD-10-CM

## 2016-09-28 DIAGNOSIS — Z955 Presence of coronary angioplasty implant and graft: Secondary | ICD-10-CM | POA: Diagnosis not present

## 2016-09-28 DIAGNOSIS — I495 Sick sinus syndrome: Secondary | ICD-10-CM | POA: Diagnosis not present

## 2016-09-28 DIAGNOSIS — Z452 Encounter for adjustment and management of vascular access device: Secondary | ICD-10-CM | POA: Diagnosis not present

## 2016-09-28 DIAGNOSIS — I2510 Atherosclerotic heart disease of native coronary artery without angina pectoris: Secondary | ICD-10-CM | POA: Diagnosis not present

## 2016-09-28 DIAGNOSIS — Z959 Presence of cardiac and vascular implant and graft, unspecified: Secondary | ICD-10-CM | POA: Diagnosis not present

## 2016-09-28 DIAGNOSIS — I429 Cardiomyopathy, unspecified: Secondary | ICD-10-CM | POA: Diagnosis not present

## 2016-09-28 DIAGNOSIS — I5022 Chronic systolic (congestive) heart failure: Secondary | ICD-10-CM | POA: Diagnosis not present

## 2016-09-28 DIAGNOSIS — I480 Paroxysmal atrial fibrillation: Secondary | ICD-10-CM | POA: Diagnosis not present

## 2016-09-28 LAB — BASIC METABOLIC PANEL
ANION GAP: 12 (ref 5–15)
BUN: 36 mg/dL — AB (ref 6–20)
CHLORIDE: 105 mmol/L (ref 101–111)
CO2: 21 mmol/L — AB (ref 22–32)
CREATININE: 1.44 mg/dL — AB (ref 0.44–1.00)
Calcium: 9 mg/dL (ref 8.9–10.3)
GFR calc Af Amer: 38 mL/min — ABNORMAL LOW (ref >60)
GFR calc non Af Amer: 33 mL/min — ABNORMAL LOW (ref >60)
GLUCOSE: 237 mg/dL — AB (ref 65–99)
Potassium: 4.1 mmol/L (ref 3.5–5.1)
SODIUM: 138 mmol/L (ref 135–145)

## 2016-09-28 NOTE — Telephone Encounter (Signed)
New Message     Case [id number]   Device initial insertion , what is the NYA class is ? Needs this information before the case closes on Monday.

## 2016-09-28 NOTE — Telephone Encounter (Signed)
Spoke to [employer] class 3.Authorization # [id number].

## 2016-09-28 NOTE — Discharge Instructions (Signed)
° ° °  Supplemental Discharge Instructions for  Pacemaker  Patients  Activity No heavy lifting or vigorous activity with your left/right arm for 6 to 8 weeks.  Do not raise your left/right arm above your head for one week.  Gradually raise your affected arm as drawn below.           __        10/01/16                     10/02/16                 10/03/16                         10/04/16  NO DRIVING for  1 week   ; you may begin driving on   5/0/51  .  WOUND CARE - Keep the wound area clean and dry.  Do not get this area wet for one week. No showers for one week; you may shower on  10/04/16   . - The tape/steri-strips on your wound will fall off; do not pull them off.  No bandage is needed on the site.  DO  NOT apply any creams, oils, or ointments to the wound area. - If you notice any drainage or discharge from the wound, any swelling or bruising at the site, or you develop a fever > 101? F after you are discharged home, call the office at once.  Special Instructions - You are still able to use cellular telephones; use the ear opposite the side where you have your pacemaker/defibrillator.  Avoid carrying your cellular phone near your device. - When traveling through airports, show security personnel your identification card to avoid being screened in the metal detectors.  Ask the security personnel to use the hand wand. - Avoid arc welding equipment, MRI testing (magnetic resonance imaging), TENS units (transcutaneous nerve stimulators).  Call the office for questions about other devices. - Avoid electrical appliances that are in poor condition or are not properly grounded. - Microwave ovens are safe to be near or to operate.

## 2016-10-04 ENCOUNTER — Telehealth: Payer: Self-pay | Admitting: Cardiovascular Disease

## 2016-10-04 NOTE — Telephone Encounter (Signed)
Routing to device team.

## 2016-10-04 NOTE — Telephone Encounter (Signed)
Spoke with son regarding patients pain at site. Son states that there is no redness, swelling or bruising at site. He states that she has never had surgery and he just wanted to reassure her that everything was okay. She is taking extra-strength tylenol. I confirmed that her symptoms sounded normal post surgery and verified her upcoming appointments.

## 2016-10-04 NOTE — Telephone Encounter (Signed)
Follow Up:    Please cal, pt says where she had her pacemaker put in,she is having so much pain.

## 2016-10-05 ENCOUNTER — Telehealth: Payer: Self-pay | Admitting: Internal Medicine

## 2016-10-05 NOTE — Telephone Encounter (Signed)
Follow Up:    Pt says she needs something stronger than Tylenol for the pain.

## 2016-10-08 ENCOUNTER — Ambulatory Visit (INDEPENDENT_AMBULATORY_CARE_PROVIDER_SITE_OTHER): Payer: Medicare HMO | Admitting: *Deleted

## 2016-10-08 DIAGNOSIS — I4821 Permanent atrial fibrillation: Secondary | ICD-10-CM

## 2016-10-08 DIAGNOSIS — I5021 Acute systolic (congestive) heart failure: Secondary | ICD-10-CM | POA: Diagnosis not present

## 2016-10-08 DIAGNOSIS — Z959 Presence of cardiac and vascular implant and graft, unspecified: Secondary | ICD-10-CM

## 2016-10-08 DIAGNOSIS — R0602 Shortness of breath: Secondary | ICD-10-CM | POA: Diagnosis not present

## 2016-10-08 DIAGNOSIS — Z5189 Encounter for other specified aftercare: Secondary | ICD-10-CM | POA: Diagnosis not present

## 2016-10-08 DIAGNOSIS — R1312 Dysphagia, oropharyngeal phase: Secondary | ICD-10-CM | POA: Diagnosis not present

## 2016-10-08 DIAGNOSIS — I482 Chronic atrial fibrillation: Secondary | ICD-10-CM | POA: Diagnosis not present

## 2016-10-08 DIAGNOSIS — J9601 Acute respiratory failure with hypoxia: Secondary | ICD-10-CM | POA: Diagnosis not present

## 2016-10-08 DIAGNOSIS — M6281 Muscle weakness (generalized): Secondary | ICD-10-CM | POA: Diagnosis not present

## 2016-10-08 DIAGNOSIS — R269 Unspecified abnormalities of gait and mobility: Secondary | ICD-10-CM | POA: Diagnosis not present

## 2016-10-08 DIAGNOSIS — I2510 Atherosclerotic heart disease of native coronary artery without angina pectoris: Secondary | ICD-10-CM | POA: Diagnosis not present

## 2016-10-08 LAB — CUP PACEART INCLINIC DEVICE CHECK
Brady Statistic RA Percent Paced: 0 %
Brady Statistic RV Percent Paced: 99.37 %
Implantable Lead Implant Date: 20180329
Implantable Lead Location: 753859
Implantable Lead Model: 3830
Implantable Lead Model: 5076
Lead Channel Impedance Value: 475 Ohm
Lead Channel Pacing Threshold Amplitude: 0.75 V
Lead Channel Pacing Threshold Amplitude: 1.75 V
Lead Channel Pacing Threshold Pulse Width: 0.5 ms
Lead Channel Pacing Threshold Pulse Width: 1 ms
Lead Channel Setting Pacing Amplitude: 3.5 V
MDC IDC LEAD IMPLANT DT: 20180329
MDC IDC LEAD IMPLANT DT: 20180329
MDC IDC LEAD LOCATION: 753860
MDC IDC LEAD LOCATION: 753860
MDC IDC MSMT BATTERY VOLTAGE: 3.08 V
MDC IDC MSMT LEADCHNL LV IMPEDANCE VALUE: 462.5 Ohm
MDC IDC MSMT LEADCHNL RV SENSING INTR AMPL: 5.5 mV
MDC IDC PG IMPLANT DT: 20180329
MDC IDC PG SERIAL: 8006933
MDC IDC SESS DTM: 20180409185225
MDC IDC SET LEADCHNL LV PACING AMPLITUDE: 3.5 V
MDC IDC SET LEADCHNL LV PACING PULSEWIDTH: 1 ms
MDC IDC SET LEADCHNL RV PACING PULSEWIDTH: 0.5 ms
MDC IDC SET LEADCHNL RV SENSING SENSITIVITY: 4 mV

## 2016-10-08 NOTE — Progress Notes (Signed)
Wound check appointment. Steri-strips removed. Wound without redness or ecchymosis, some edema noted over device site. Reviewed by Greggory Brandy, no active bleeding, continue Eliquis as prescribed. Incision edges approximated and well healed. Pt and son aware to monitor for any signs/symptoms of infection or bleeding and to call if present.  Dr. Rayann Heman wrote physical prescription for Norco 5mg /325mg  (10 tabs, no refills) for patient to be taken q6 hrs PRN for pain.  Prescription given to patient's son per her request.  Patient verbalizes understanding of instructions to ensure she is not exceeding recommended acetaminophen dose.  Normal device function. Thresholds, sensing, and impedances consistent with implant measurements. R-waves measure 5.60mV today, RV sensitivity maintained at 4.23mV per JA. LV (His) selective capture above 3.0V, non-selective below 3.0V. LV (His) output reduced to 3.5V per JA. Histogram distribution blunted, s/p AVN ablation, base rate reduced to 70bpm, sensor turned on at nominal settings per [facility]. No high ventricular rates noted. Patient educated about wound care, arm mobility, lifting restrictions. ROV with AS on 11/14/16 and ROV with JA on 12/31/16.

## 2016-10-29 ENCOUNTER — Ambulatory Visit (HOSPITAL_COMMUNITY)
Admission: RE | Admit: 2016-10-29 | Discharge: 2016-10-29 | Disposition: A | Discharge status: Home / Self Care | Payer: Medicare HMO | Source: Ambulatory Visit | Attending: Cardiology | Admitting: Cardiology

## 2016-10-29 ENCOUNTER — Ambulatory Visit (INDEPENDENT_AMBULATORY_CARE_PROVIDER_SITE_OTHER): Payer: Self-pay | Admitting: *Deleted

## 2016-10-29 ENCOUNTER — Encounter (HOSPITAL_COMMUNITY): Payer: Self-pay

## 2016-10-29 VITALS — BP 120/71 | HR 70 | Wt 122.5 lb

## 2016-10-29 DIAGNOSIS — I4891 Unspecified atrial fibrillation: Secondary | ICD-10-CM | POA: Diagnosis not present

## 2016-10-29 DIAGNOSIS — Z955 Presence of coronary angioplasty implant and graft: Secondary | ICD-10-CM | POA: Diagnosis not present

## 2016-10-29 DIAGNOSIS — I5022 Chronic systolic (congestive) heart failure: Secondary | ICD-10-CM | POA: Insufficient documentation

## 2016-10-29 DIAGNOSIS — I428 Other cardiomyopathies: Secondary | ICD-10-CM | POA: Insufficient documentation

## 2016-10-29 DIAGNOSIS — I2510 Atherosclerotic heart disease of native coronary artery without angina pectoris: Secondary | ICD-10-CM | POA: Insufficient documentation

## 2016-10-29 DIAGNOSIS — Z823 Family history of stroke: Secondary | ICD-10-CM | POA: Insufficient documentation

## 2016-10-29 DIAGNOSIS — E119 Type 2 diabetes mellitus without complications: Secondary | ICD-10-CM | POA: Diagnosis not present

## 2016-10-29 DIAGNOSIS — I482 Chronic atrial fibrillation: Secondary | ICD-10-CM

## 2016-10-29 DIAGNOSIS — I4892 Unspecified atrial flutter: Secondary | ICD-10-CM | POA: Diagnosis not present

## 2016-10-29 DIAGNOSIS — Z7984 Long term (current) use of oral hypoglycemic drugs: Secondary | ICD-10-CM | POA: Diagnosis not present

## 2016-10-29 DIAGNOSIS — Z8673 Personal history of transient ischemic attack (TIA), and cerebral infarction without residual deficits: Secondary | ICD-10-CM | POA: Diagnosis not present

## 2016-10-29 DIAGNOSIS — Z950 Presence of cardiac pacemaker: Secondary | ICD-10-CM | POA: Diagnosis not present

## 2016-10-29 DIAGNOSIS — Z7901 Long term (current) use of anticoagulants: Secondary | ICD-10-CM | POA: Diagnosis not present

## 2016-10-29 DIAGNOSIS — Z79899 Other long term (current) drug therapy: Secondary | ICD-10-CM | POA: Diagnosis not present

## 2016-10-29 DIAGNOSIS — Z87891 Personal history of nicotine dependence: Secondary | ICD-10-CM | POA: Insufficient documentation

## 2016-10-29 DIAGNOSIS — Z8249 Family history of ischemic heart disease and other diseases of the circulatory system: Secondary | ICD-10-CM | POA: Diagnosis not present

## 2016-10-29 DIAGNOSIS — E785 Hyperlipidemia, unspecified: Secondary | ICD-10-CM | POA: Diagnosis not present

## 2016-10-29 DIAGNOSIS — I4821 Permanent atrial fibrillation: Secondary | ICD-10-CM

## 2016-10-29 DIAGNOSIS — Z959 Presence of cardiac and vascular implant and graft, unspecified: Secondary | ICD-10-CM

## 2016-10-29 LAB — CBC
HEMATOCRIT: 32.1 % — AB (ref 36.0–46.0)
Hemoglobin: 10.9 g/dL — ABNORMAL LOW (ref 12.0–15.0)
MCH: 34.4 pg — ABNORMAL HIGH (ref 26.0–34.0)
MCHC: 34 g/dL (ref 30.0–36.0)
MCV: 101.3 fL — ABNORMAL HIGH (ref 78.0–100.0)
PLATELETS: 174 10*3/uL (ref 150–400)
RBC: 3.17 MIL/uL — ABNORMAL LOW (ref 3.87–5.11)
RDW: 15.7 % — ABNORMAL HIGH (ref 11.5–15.5)
WBC: 9.3 10*3/uL (ref 4.0–10.5)

## 2016-10-29 LAB — BASIC METABOLIC PANEL
Anion gap: 10 (ref 5–15)
BUN: 42 mg/dL — ABNORMAL HIGH (ref 6–20)
CHLORIDE: 102 mmol/L (ref 101–111)
CO2: 27 mmol/L (ref 22–32)
CREATININE: 1.63 mg/dL — AB (ref 0.44–1.00)
Calcium: 9.7 mg/dL (ref 8.9–10.3)
GFR calc non Af Amer: 28 mL/min — ABNORMAL LOW (ref >60)
GFR, EST AFRICAN AMERICAN: 33 mL/min — AB (ref >60)
Glucose, Bld: 113 mg/dL — ABNORMAL HIGH (ref 65–99)
Potassium: 4 mmol/L (ref 3.5–5.1)
SODIUM: 139 mmol/L (ref 135–145)

## 2016-10-29 LAB — DIGOXIN LEVEL: DIGOXIN LVL: 0.4 ng/mL — AB (ref 0.8–2.0)

## 2016-10-29 MED ORDER — CEPHALEXIN 500 MG PO CAPS: 500.0000 mg | ORAL_CAPSULE | Freq: Two times a day (BID) | ORAL | 0 refills | Status: DC

## 2016-10-29 MED ORDER — SPIRONOLACTONE 25 MG PO TABS: 25.0000 mg | ORAL_TABLET | Freq: Every day | ORAL | 6 refills | Status: DC

## 2016-10-29 NOTE — Progress Notes (Signed)
Ms. Sudol was seen in the [facility] today for bleeding from left chest PPM incision. Hematoma noted at device pocket, ecchymosis in various stages of healing. Incision darker than rest of device site. Small unapproximated areas mid-incision with dark red blood able to be expressed. Tenderness upon palpation from middle to lower area of device. Ecchymosis in left axilla, soft upon palpation. Dr. Rayann Heman evaluated incision, recommended holding Eliquis until Friday, Keflex 500mg  BID for 7 days, applying steri-strips and a mild pressure dressing to site.  4 steri-strips applied to incision with minimal oozing from incision, several 4x4 gauze applied over device site and tensoplast adhesive applied from left shoulder to left breast. Keflex sent to preferred pharmacy (confirmed with patient) and [facility] appointment scheduled for 11/02/16 at 12:00. Dr. Rayann Heman would like to assess the incision at this appointment. Patient verbalizes understanding of all instructions.

## 2016-10-29 NOTE — Patient Instructions (Signed)
Hold Eliquis until after appointment on Friday for wound re-check.  Take Keflex (antibiotic) twice daily starting today for 7 days, finish completely. Do not get dressing wet until removal on Friday 11/02/16. Wound check appointment at [facility] at 12:00pm Friday 11/02/16.

## 2016-10-29 NOTE — Patient Instructions (Addendum)
Labs today (will call for abnormal results, otherwise no news is good news)  HOLD your next 2 doses of Eliquis. Go see Device Clinic once you leave are office, they are going to check your device site and make sure it's ok since you are still having some bleeding from site.   INCREASE Spironolactone to 25 mg (1 Tablet) Once Daily at bedtime  Labs in 10 days (BMET)  Follow up in 6 weeks.

## 2016-10-30 NOTE — Progress Notes (Signed)
Patient ID: Shannon [name], female   DOB: [birth year], 83 y.o.   MRN: [id number] PCP: Dr. Osborne Casco Cardiology: Dr. [name]  83 yo with paroxysmal atrial fibrillation, chronic systolic CHF, and CAD presents for cardiology followup. She apparently had been in persistent atrial fibrillation since around 8/16.  It appears that she was tachycardic most of the time prior to her recent admission in 7/17.  She had been on amiodarone prior to admission but it was not keeping her in NSR.  She was admitted with acute systolic CHF.  EF had dropped to 20-25% in 7/17 from 50-55% in 10/16.  She was in atrial fibrillation with RVR.  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  Amiodarone was stopped due to suspicion for amiodarone-induced lung toxicity (she was seen by pulmonary).  She was eventually cardioverted to NSR.  Ranolazine was started to try to keep her in NSR.  HR was low in the hospital so she has not been on a beta blocker.  She was extensively diuresed and discharged to SNF.  After discharge, she developed persistent nausea.  Eventually, it was found that the nausea was likely from ranolazine, so this medication was stopped and the nausea resolved.    Patient continued to have difficult-to-control atrial fibrillation.  GFR was too low for Tikosyn.  She therefore had AV nodal ablation with St Jude CRT-P device placed in 3/18.    Symptomatically, she has been doing well since then.  No dyspnea walking on flat ground. Some general fatigue with heavier exertion.  No orthopnea/PND. Feels stronger since AV nodal ablation.  She has, of note, had some bleeding at her device site over the last week.   Labs (7/17): Digoxin 1.6 => 0.3, K 4.6, creatinine 0.87 => 0.76, HCT 34, BNP 265 Labs (9/17): K 5, creatinine 1.36 Labs (11/17): K 4.9, creatinine 1.29, hgb 9.2, digoxin 0.5 Labs (12/17): hgb 10.4, digoxin 0.4 Labs (1/18): K 4, creatinine 1.62 => 1.49, hgb 9.5, LDL 42, HDL  63 Labs (3/18): K 4.1, creatinine 1.44, digoxin 0.4  PMH: 1. Atrial fibrillation: Paroxysmal.  Had been persistent since 8/16, was cardioverted to NSR during 7/17 admission.  Possible atypical atrial flutter on 12/17 ECG.  By 3/18, atrial fibrillation again.  - Nausea with ranolazine used for atrial fibrillation suppression.  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  - AV nodal ablation 3/18 with CRT-P.  2. 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  She had been in atrial fibrillation with elevated rate possibly for months prior to 7/17 admission.  - Echo (10/16): EF 50-55%.  - Echo (7/17): EF 20-25%, mildly decreased RV systolic function, severe TR, PA systolic pressure 64 mmHg.  - LHC/RHC (7/17): Patent OM stent, no obstructive CAD.  Mean RA 13, PA 45/25, mean PCWP 16, CI 2.36.  - Echo (1/18): EF 30-35% with diffuse hypokinesis, mildly decreased RV systolic function, moderate TR and MR, PASP 53 mmHg, severe LAE.  - St Jude CRT-P placed with AV nodal ablation in 3/18.  3. CAD: DES to OM in 2007.  LHC (7/17) with patent stent and no new obstructive disease.  4. Type II diabetes 5. Hyperlipidemia 6. TIA 7. Possible amiodarone lung toxicity: Chest CT (7/17) with bronchiectasis/fibrosis, patchy bilateral densities.   Social History   Social History  . Marital status: Married    Spouse name: N/A  . Number of children: N/A  . Years of education: N/A   Occupational History  . Not on file.  Social History Main Topics  . Smoking status: Former Smoker    Packs/day: 1.00    Types: Cigarettes    Quit date: 07/03/1963  . Smokeless tobacco: Never Used  . Alcohol use No  . Drug use: No  . Sexual activity: No   Other Topics Concern  . Not on file   Social History Narrative   Lives in [place] with husband.  Does not routinely exercise but is able to keep house and go food shopping.   Family History  Problem Relation Age of Onset  . Heart attack  Mother   . Stroke Mother   . Heart attack Father   . Heart attack Brother   . Coronary artery disease Sister     CABG  . Alzheimer's disease Sister     X3  . Stroke Sister   . Heart attack Sister    ROS: All systems reviewed and negative except as per HPI.   Current Outpatient Prescriptions  Medication Sig Dispense Refill  . ACCU-CHEK FASTCLIX LANCETS MISC USE TO CHECK BLOOD SUGAR TWICE DAILY  5  . ACCU-CHEK SMARTVIEW test strip     . acetaminophen (TYLENOL) 325 MG tablet Take 2 tablets (650 mg total) by mouth every 4 (four) hours as needed for headache or mild pain.    [name] apixaban (ELIQUIS) 2.5 MG TABS tablet Take 1 tablet (2.5 mg total) by mouth 2 (two) times daily. 180 tablet 3  . atorvastatin (LIPITOR) 20 MG tablet Take 1 tablet (20 mg total) by mouth daily. Please call and schedule a 3 month follow up appointment with Dr Acie Fredrickson per last office visit 90 tablet 2  . bisoprolol (ZEBETA) 5 MG tablet Take 1 tablet (5 mg total) by mouth daily. 30 tablet 6  . Cholecalciferol (VITAMIN D) 2000 units tablet Take 2,000 Units by mouth daily.    . digoxin (LANOXIN) 0.125 MG tablet Take 0.5 tablets (0.0625 mg total) by mouth every other day. 45 tablet 2  . furosemide (LASIX) 20 MG tablet Take 20 mg (1 tab) once daily alternating with 40 mg (2 tabs) the next day continuously. 90 tablet 3  . metFORMIN (GLUCOPHAGE) 500 MG tablet Take 500 mg by mouth daily with supper.     [name] spironolactone (ALDACTONE) 25 MG tablet Take 1 tablet (25 mg total) by mouth daily. 30 tablet 6  . vitamin C (ASCORBIC ACID) 250 MG tablet Take 250 mg by mouth daily. Veggie cap with 250mg  vitamin C and 250mg  of cayenne    . cephALEXin (KEFLEX) 500 MG capsule Take 1 capsule (500 mg total) by mouth 2 (two) times daily. 14 capsule 0  . nitroGLYCERIN (NITROSTAT) 0.4 MG SL tablet Place 1 tablet (0.4 mg total) under the tongue every 5 (five) minutes x 3 doses as needed for chest pain. (Patient not taking: Reported on 10/29/2016) 25  tablet 3   No current facility-administered medications for this encounter.    BP 120/71   Pulse 70   Wt 122 lb 8 oz (55.6 kg)   SpO2 100%   BMI 22.41 kg/m  General: NAD Neck: JVP 7 cm, no thyromegaly or thyroid nodule.  Lungs: Clear to auscultation bilaterally with normal respiratory effort. CV: Nondisplaced PMI.  Heart irregular S1/S2, no S3/S4, 3/6 HSM LLSB/apex.  No edema.  No carotid bruit.  Normal pedal pulses.  Abdomen: Soft, nontender, no hepatosplenomegaly, mild distention.  Skin: Intact without lesions or rashes.  Neurologic: Alert and oriented x 3.  Psych: Normal affect. Extremities: No  clubbing or cyanosis.  HEENT: Normal.   Assessment/Plan: 1. Chronic systolic CHF: EF 76-81% on 7/17 echo, 30-35% on 1/18 echo.  Nonischemic cardiomyopathy, possible role for tachycardia-mediated cardiomyopathy (some improvement in EF with rate control but not back to normal). She had AV nodal ablation due to difficult-to-control atrial fibrillation in 3/18 with St Jude CRT-P placement. NYHA class II, looks euvolemic.  - Continue Lasix 40 daily alternating with 20 mg daily.   - Continue digoxin, recent level ok.   - Unable to tolerate losartan at even low dose.  - Continue bisoprolol 5 mg daily.  - Increase spironolactone to 25 mg daily. BMET in 10 days.  2. Atrial fibrillation/flutter:  Concern for tachy-mediated cardiomyopathy.  She had suspected amiodarone-induced lung toxicity so ranolazine was started.  She did not tolerate ranolazine due to nausea. Poor Tikosyn candidate with CKD.   She tolerated atrial fibrillation poorly and rate was difficult to control, so she had AV nodal ablation with St Jude CRT-P device.  She seems to be doing better since the ablation.  - Continue apixaban 2.5 mg bid.   3. CAD: Patent OM stent on coronary angiography in 7/17.  Continue statin. No ASA with warfarin use.  Good lipids in 1/18.  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  Off amiodarone.   Follows with pulmonary.   Followup in 6 wks.   [name] 10/30/2016

## 2016-11-02 ENCOUNTER — Encounter (HOSPITAL_COMMUNITY)
Admission: AD | Disposition: A | Discharge status: Home / Self Care | Payer: Self-pay | Source: Ambulatory Visit | Attending: Internal Medicine

## 2016-11-02 ENCOUNTER — Observation Stay (HOSPITAL_COMMUNITY)
Admission: AD | Admit: 2016-11-02 | Discharge: 2016-11-03 | Disposition: A | Discharge status: Home / Self Care | Payer: Medicare HMO | Source: Ambulatory Visit | Attending: Internal Medicine | Admitting: Internal Medicine

## 2016-11-02 ENCOUNTER — Ambulatory Visit (INDEPENDENT_AMBULATORY_CARE_PROVIDER_SITE_OTHER): Payer: Self-pay | Admitting: Internal Medicine

## 2016-11-02 ENCOUNTER — Other Ambulatory Visit: Payer: Self-pay | Admitting: Internal Medicine

## 2016-11-02 DIAGNOSIS — Z7901 Long term (current) use of anticoagulants: Secondary | ICD-10-CM | POA: Diagnosis not present

## 2016-11-02 DIAGNOSIS — G4730 Sleep apnea, unspecified: Secondary | ICD-10-CM | POA: Insufficient documentation

## 2016-11-02 DIAGNOSIS — Z955 Presence of coronary angioplasty implant and graft: Secondary | ICD-10-CM | POA: Diagnosis not present

## 2016-11-02 DIAGNOSIS — L7632 Postprocedural hematoma of skin and subcutaneous tissue following other procedure: Principal | ICD-10-CM | POA: Insufficient documentation

## 2016-11-02 DIAGNOSIS — Z950 Presence of cardiac pacemaker: Secondary | ICD-10-CM | POA: Insufficient documentation

## 2016-11-02 DIAGNOSIS — T82837A Hemorrhage of cardiac prosthetic devices, implants and grafts, initial encounter: Secondary | ICD-10-CM | POA: Diagnosis not present

## 2016-11-02 DIAGNOSIS — I341 Nonrheumatic mitral (valve) prolapse: Secondary | ICD-10-CM | POA: Insufficient documentation

## 2016-11-02 DIAGNOSIS — Z8673 Personal history of transient ischemic attack (TIA), and cerebral infarction without residual deficits: Secondary | ICD-10-CM | POA: Insufficient documentation

## 2016-11-02 DIAGNOSIS — G8918 Other acute postprocedural pain: Secondary | ICD-10-CM | POA: Diagnosis not present

## 2016-11-02 DIAGNOSIS — Z7984 Long term (current) use of oral hypoglycemic drugs: Secondary | ICD-10-CM | POA: Insufficient documentation

## 2016-11-02 DIAGNOSIS — M1990 Unspecified osteoarthritis, unspecified site: Secondary | ICD-10-CM | POA: Insufficient documentation

## 2016-11-02 DIAGNOSIS — I482 Chronic atrial fibrillation: Secondary | ICD-10-CM | POA: Diagnosis not present

## 2016-11-02 DIAGNOSIS — E785 Hyperlipidemia, unspecified: Secondary | ICD-10-CM | POA: Diagnosis not present

## 2016-11-02 DIAGNOSIS — T82897A Other specified complication of cardiac prosthetic devices, implants and grafts, initial encounter: Secondary | ICD-10-CM | POA: Diagnosis not present

## 2016-11-02 DIAGNOSIS — Y831 Surgical operation with implant of artificial internal device as the cause of abnormal reaction of the patient, or of later complication, without mention of misadventure at the time of the procedure: Secondary | ICD-10-CM | POA: Diagnosis not present

## 2016-11-02 DIAGNOSIS — E119 Type 2 diabetes mellitus without complications: Secondary | ICD-10-CM | POA: Diagnosis not present

## 2016-11-02 DIAGNOSIS — I2510 Atherosclerotic heart disease of native coronary artery without angina pectoris: Secondary | ICD-10-CM | POA: Insufficient documentation

## 2016-11-02 DIAGNOSIS — I252 Old myocardial infarction: Secondary | ICD-10-CM | POA: Insufficient documentation

## 2016-11-02 HISTORY — PX: IMPLANT POCKET INCISION & DRAINAGE: EP1210

## 2016-11-02 LAB — GLUCOSE, CAPILLARY
GLUCOSE-CAPILLARY: 217 mg/dL — AB (ref 65–99)
Glucose-Capillary: 153 mg/dL — ABNORMAL HIGH (ref 65–99)

## 2016-11-02 SURGERY — IMPLANT POCKET INCISION & DRAINAGE

## 2016-11-02 MED ORDER — SODIUM CHLORIDE 0.9% FLUSH: 3.0000 mL | INTRAVENOUS | Status: DC | PRN

## 2016-11-02 MED ORDER — LIDOCAINE HCL (PF) 1 % IJ SOLN
INTRAMUSCULAR | Status: AC
  Filled 2016-11-02: qty 30

## 2016-11-02 MED ORDER — NITROGLYCERIN 0.4 MG SL SUBL: 0.4000 mg | SUBLINGUAL_TABLET | SUBLINGUAL | Status: DC | PRN

## 2016-11-02 MED ORDER — SODIUM CHLORIDE 0.9 % IR SOLN
80.0000 mg | Status: AC
  Administered 2016-11-02: 80 mg
  Filled 2016-11-02: qty 2

## 2016-11-02 MED ORDER — VANCOMYCIN HCL IN DEXTROSE 1-5 GM/200ML-% IV SOLN
1000.0000 mg | INTRAVENOUS | Status: AC
  Administered 2016-11-02: 1000 mg via INTRAVENOUS

## 2016-11-02 MED ORDER — MORPHINE SULFATE (PF) 10 MG/ML IV SOLN: 2.0000 mg | Freq: Once | INTRAVENOUS | Status: DC

## 2016-11-02 MED ORDER — SODIUM CHLORIDE 0.9 % IR SOLN
Status: DC | PRN
  Administered 2016-11-02: 18:00:00

## 2016-11-02 MED ORDER — SODIUM CHLORIDE 0.9 % IV SOLN: 250.0000 mL | INTRAVENOUS | Status: DC | PRN

## 2016-11-02 MED ORDER — SODIUM CHLORIDE 0.9 % IV SOLN
INTRAVENOUS | Status: DC
  Administered 2016-11-02: 14:00:00 via INTRAVENOUS

## 2016-11-02 MED ORDER — ATORVASTATIN CALCIUM 20 MG PO TABS: 20.0000 mg | ORAL_TABLET | Freq: Every day | ORAL | Status: DC

## 2016-11-02 MED ORDER — VANCOMYCIN HCL IN DEXTROSE 1-5 GM/200ML-% IV SOLN
INTRAVENOUS | Status: AC
  Filled 2016-11-02: qty 200

## 2016-11-02 MED ORDER — MIDAZOLAM HCL 5 MG/5ML IJ SOLN
INTRAMUSCULAR | Status: DC | PRN
  Administered 2016-11-02 (×4): 1 mg via INTRAVENOUS

## 2016-11-02 MED ORDER — FUROSEMIDE 40 MG PO TABS
40.0000 mg | ORAL_TABLET | Freq: Every day | ORAL | Status: DC
  Administered 2016-11-03: 40 mg via ORAL
  Filled 2016-11-02: qty 1

## 2016-11-02 MED ORDER — SODIUM CHLORIDE 0.9% FLUSH
3.0000 mL | Freq: Two times a day (BID) | INTRAVENOUS | Status: DC
  Administered 2016-11-02: 3 mL via INTRAVENOUS

## 2016-11-02 MED ORDER — FENTANYL CITRATE (PF) 100 MCG/2ML IJ SOLN
INTRAMUSCULAR | Status: DC | PRN
  Administered 2016-11-02 (×3): 12.5 ug via INTRAVENOUS

## 2016-11-02 MED ORDER — LIDOCAINE HCL (PF) 1 % IJ SOLN
INTRAMUSCULAR | Status: AC
Start: 2016-11-02 — End: 2016-11-02
  Filled 2016-11-02: qty 30

## 2016-11-02 MED ORDER — MIDAZOLAM HCL 5 MG/5ML IJ SOLN
INTRAMUSCULAR | Status: AC
  Filled 2016-11-02: qty 5

## 2016-11-02 MED ORDER — SODIUM CHLORIDE 0.45 % IV SOLN
INTRAVENOUS | Status: DC
  Administered 2016-11-02: 15:00:00 via INTRAVENOUS

## 2016-11-02 MED ORDER — METFORMIN HCL 500 MG PO TABS: 500.0000 mg | ORAL_TABLET | Freq: Every day | ORAL | Status: DC

## 2016-11-02 MED ORDER — ONDANSETRON HCL 4 MG/2ML IJ SOLN: 4.0000 mg | Freq: Four times a day (QID) | INTRAMUSCULAR | Status: DC | PRN

## 2016-11-02 MED ORDER — FENTANYL CITRATE (PF) 100 MCG/2ML IJ SOLN
25.0000 ug | Freq: Once | INTRAMUSCULAR | Status: AC
  Administered 2016-11-02: 25 ug via INTRAVENOUS

## 2016-11-02 MED ORDER — FENTANYL CITRATE (PF) 100 MCG/2ML IJ SOLN
25.0000 ug | Freq: Once | INTRAMUSCULAR | Status: DC
Start: 2016-11-02 — End: 2016-11-03

## 2016-11-02 MED ORDER — CHLORHEXIDINE GLUCONATE 4 % EX LIQD
60.0000 mL | Freq: Once | CUTANEOUS | Status: AC
Start: 2016-11-02 — End: 2016-11-02
  Administered 2016-11-02: 4 via TOPICAL

## 2016-11-02 MED ORDER — LIDOCAINE HCL (PF) 1 % IJ SOLN
INTRAMUSCULAR | Status: DC | PRN
Start: 2016-11-02 — End: 2016-11-02
  Administered 2016-11-02: 45 mL

## 2016-11-02 MED ORDER — OXYCODONE-ACETAMINOPHEN 5-325 MG PO TABS
1.0000 | ORAL_TABLET | Freq: Once | ORAL | Status: DC
  Administered 2016-11-02: 1 via ORAL

## 2016-11-02 MED ORDER — CEPHALEXIN 500 MG PO CAPS
500.0000 mg | ORAL_CAPSULE | Freq: Two times a day (BID) | ORAL | Status: DC
  Administered 2016-11-02 – 2016-11-03 (×2): 500 mg via ORAL
  Filled 2016-11-02 (×2): qty 1

## 2016-11-02 MED ORDER — SPIRONOLACTONE 25 MG PO TABS
25.0000 mg | ORAL_TABLET | Freq: Every day | ORAL | Status: DC
  Administered 2016-11-03: 25 mg via ORAL
  Filled 2016-11-02: qty 1

## 2016-11-02 MED ORDER — FENTANYL CITRATE (PF) 100 MCG/2ML IJ SOLN
INTRAMUSCULAR | Status: AC
  Filled 2016-11-02: qty 2

## 2016-11-02 MED ORDER — DIGOXIN 0.0625 MG HALF TABLET
0.0625 mg | ORAL_TABLET | ORAL | Status: DC
  Administered 2016-11-03: 0.0625 mg via ORAL
  Filled 2016-11-02: qty 1

## 2016-11-02 MED ORDER — OXYCODONE-ACETAMINOPHEN 5-325 MG PO TABS
ORAL_TABLET | ORAL | Status: AC
  Administered 2016-11-02: 1 via ORAL
  Filled 2016-11-02: qty 1

## 2016-11-02 MED ORDER — BISOPROLOL FUMARATE 5 MG PO TABS
5.0000 mg | ORAL_TABLET | Freq: Every day | ORAL | Status: DC
  Administered 2016-11-03: 5 mg via ORAL
  Filled 2016-11-02: qty 1

## 2016-11-02 SURGICAL SUPPLY — 3 items
CABLE SURGICAL S-101-97-12 (CABLE) ×3 IMPLANT
PAD DEFIB LIFELINK (PAD) ×3 IMPLANT
TRAY PACEMAKER INSERTION (PACKS) ×3 IMPLANT

## 2016-11-02 NOTE — H&P (Signed)
PCP: Haywood Pao, MD Primary Cardiologist:  Dr Acie Fredrickson Primary EP:  Dr Rayann Heman  Shannon [name] is a 83 y.o. female who presents today for urgent device followup.  She underwent AV nodal ablation and PPM implant be me in early April.  She did well initially.  She subsequently developed a pocket hematoma on eliquis.  She saw me earlier this week and the hematoma was noted to be large.  Eliquis was discontinued and she was started on keflex 500mg  BID. She has had no symptoms of infection.  She feels "much better" s/p AV nodal ablation. Today, she denies symptoms of palpitations, chest pain, shortness of breath,  lower extremity edema, dizziness, presyncope, or syncope.  The patient is otherwise without complaint today.       Past Medical History:  Diagnosis Date  . Arthritis   . Baker's cyst of knee   . CAD (coronary artery disease)    a. 05/2006 Cath/PCI: LAD 76m, D1 40 ost, LCX nl, OM1 95 (3.0x20 Taxus DES), RCA nl, EF 40% w/ lat AK;  04/2007 Ex MV: minimal lat ischemia in area of prior infarct->low risk-> med Rx.  . Chronic atrial fibrillation (HCC)    a. on Coumadin  . Chronic systolic (congestive) heart failure ([place])    a. 04/2015: EF 50-55% b. echo 12/2015: EF 20-25% w/ diffuse HK, biatrial enlargement, mild AI and MR, severe TR    . Diabetes mellitus   . Hyperlipidemia   . MVP (mitral valve prolapse)    a. 10/2003 Echo: nl LV fxn, mild MR/TR/AS  . Myocardial infarction ([place])   . Sleep apnea   . Transient ischemic attack         Past Surgical History:  Procedure Laterality Date  . ATRIAL FLUTTER ABLATION N/A 10/09/2012   Procedure: ATRIAL FLUTTER ABLATION;  Surgeon: Thompson Grayer, MD;  Location: [facility] CATH LAB;  Service: Cardiovascular;  Laterality: N/A;  . AV NODE ABLATION N/A 09/27/2016   Procedure: AV Node Ablation;  Surgeon: Thompson Grayer, MD;  Location: [place] CV LAB;  Service: Cardiovascular;  Laterality: N/A;  . BIV PACEMAKER INSERTION CRT-P N/A  09/27/2016   Procedure: BiV Pacemaker Insertion CRT-P;  Surgeon: Thompson Grayer, MD;  Location: [place] CV LAB;  Service: Cardiovascular;  Laterality: N/A;  . CARDIAC CATHETERIZATION N/A 01/06/2016   Procedure: Right/Left Heart Cath and Coronary Angiography;  Surgeon: Belva Crome, MD;  Location: [place] CV LAB;  Service: Cardiovascular;  Laterality: N/A;  . CARDIOVERSION N/A 01/11/2016   Procedure: CARDIOVERSION;  Surgeon: Larey Dresser, MD;  Location: [facility] ENDOSCOPY;  Service: Cardiovascular;  Laterality: N/A;  . cataracts    . COLONOSCOPY N/A 10/13/2015   Procedure: COLONOSCOPY;  Surgeon: Manus Gunning, MD;  Location: [facility] ENDOSCOPY;  Service: Gastroenterology;  Laterality: N/A;  . CORONARY ANGIOPLASTY     Status post PTCA and stenting of the first obtuse marginal-05/13/2006. We placed a 3.0 x 20 mm Taxus stent. It was post dilated using a 3.25 mm noncompliant balloon up to 14 atmospheres  . TONSILLECTOMY      ROS- all systems are reviewed and negative except as per HPI above        Current Outpatient Prescriptions  Medication Sig Dispense Refill  . ACCU-CHEK FASTCLIX LANCETS MISC USE TO CHECK BLOOD SUGAR TWICE DAILY  5  . ACCU-CHEK SMARTVIEW test strip     . acetaminophen (TYLENOL) 325 MG tablet Take 2 tablets (650 mg total) by mouth every 4 (four) hours as  needed for headache or mild pain.    [name] apixaban (ELIQUIS) 2.5 MG TABS tablet Take 1 tablet (2.5 mg total) by mouth 2 (two) times daily. 180 tablet 3  . atorvastatin (LIPITOR) 20 MG tablet Take 1 tablet (20 mg total) by mouth daily. Please call and schedule a 3 month follow up appointment with Dr Acie Fredrickson per last office visit 90 tablet 2  . bisoprolol (ZEBETA) 5 MG tablet Take 1 tablet (5 mg total) by mouth daily. 30 tablet 6  . cephALEXin (KEFLEX) 500 MG capsule Take 1 capsule (500 mg total) by mouth 2 (two) times daily. 14 capsule 0  . Cholecalciferol (VITAMIN D) 2000 units tablet Take 2,000 Units by  mouth daily.    . digoxin (LANOXIN) 0.125 MG tablet Take 0.5 tablets (0.0625 mg total) by mouth every other day. 45 tablet 2  . furosemide (LASIX) 20 MG tablet Take 20 mg (1 tab) once daily alternating with 40 mg (2 tabs) the next day continuously. 90 tablet 3  . metFORMIN (GLUCOPHAGE) 500 MG tablet Take 500 mg by mouth daily with supper.     . nitroGLYCERIN (NITROSTAT) 0.4 MG SL tablet Place 1 tablet (0.4 mg total) under the tongue every 5 (five) minutes x 3 doses as needed for chest pain. (Patient not taking: Reported on 10/29/2016) 25 tablet 3  . spironolactone (ALDACTONE) 25 MG tablet Take 1 tablet (25 mg total) by mouth daily. 30 tablet 6  . vitamin C (ASCORBIC ACID) 250 MG tablet Take 250 mg by mouth daily. Veggie cap with 250mg  vitamin C and 250mg  of cayenne     No current facility-administered medications for this visit.     Physical Exam: There were no vitals filed for this visit.  GEN- The patient is elderly appearing, alert and oriented x 3 today.   Head- normocephalic, atraumatic Eyes-  Sclera clear, conjunctiva pink Ears- hearing intact Oropharynx- clear Lungs- normal work of breathing Chest- pacemaker pocket hematoma is noted.  No redness, tenderness or warmth.  The hematoma is modest in size.  There is mild heme drainage but no purulence. Heart- Regular rate and rhythm (paced) Extremities- no clubbing, cyanosis, or edema  Assessment and Plan:  1. Pacemaker pocket hematoma She has developed modest hematoma with drainage on eliquis. Eliquis has been discontinued.  Though she is not actively bleeding, I worry that risks of infection are quite high.  Though she is not dependant, her conduction is very slow s/p av nodal ablation.  I have spoken with Dr Lovena Le.  He and I agree that hematoma evacuation with pocket revision and submuscular device placement may be the best option.  The patient will therefore to proceed with this procedure later today.  Thompson Grayer MD,  [facility] 11/02/2016 1:13 PM   EP Attending  Patient seen and examined. Agree with the findings as noted by Dr. Rayann Heman earlier today. She has a PM pocket hematoma which has begun to drain. We will attempt to salvage her system by evacuating the pocket and placing the device under the pectoralis major. I have discussed the risks/benefits/goals/expectations of the procedure and she wishes to proceed.  Mikle Bosworth.D.

## 2016-11-02 NOTE — Progress Notes (Signed)
PCP: Haywood Pao, MD Primary Cardiologist:  Dr Acie Fredrickson Primary EP:  Dr Rayann Heman  Shannon [name] is a 83 y.o. female who presents today for urgent device followup.  She underwent AV nodal ablation and PPM implant be me in early April.  She did well initially.  She subsequently developed a pocket hematoma on eliquis.  She saw me earlier this week and the hematoma was noted to be large.  Eliquis was discontinued and she was started on keflex 500mg  BID. She has had no symptoms of infection.  She feels "much better" s/p AV nodal ablation. Today, she denies symptoms of palpitations, chest pain, shortness of breath,  lower extremity edema, dizziness, presyncope, or syncope.  The patient is otherwise without complaint today.   Past Medical History:  Diagnosis Date  . Arthritis   . Baker's cyst of knee   . CAD (coronary artery disease)    a. 05/2006 Cath/PCI: LAD 59m, D1 40 ost, LCX nl, OM1 95 (3.0x20 Taxus DES), RCA nl, EF 40% w/ lat AK;  04/2007 Ex MV: minimal lat ischemia in area of prior infarct->low risk-> med Rx.  . Chronic atrial fibrillation (HCC)    a. on Coumadin  . Chronic systolic (congestive) heart failure ([place])    a. 04/2015: EF 50-55% b. echo 12/2015: EF 20-25% w/ diffuse HK, biatrial enlargement, mild AI and MR, severe TR    . Diabetes mellitus   . Hyperlipidemia   . MVP (mitral valve prolapse)    a. 10/2003 Echo: nl LV fxn, mild MR/TR/AS  . Myocardial infarction ([place])   . Sleep apnea   . Transient ischemic attack    Past Surgical History:  Procedure Laterality Date  . ATRIAL FLUTTER ABLATION N/A 10/09/2012   Procedure: ATRIAL FLUTTER ABLATION;  Surgeon: Thompson Grayer, MD;  Location: [facility] CATH LAB;  Service: Cardiovascular;  Laterality: N/A;  . AV NODE ABLATION N/A 09/27/2016   Procedure: AV Node Ablation;  Surgeon: Thompson Grayer, MD;  Location: [place] CV LAB;  Service: Cardiovascular;  Laterality: N/A;  . BIV PACEMAKER INSERTION CRT-P N/A 09/27/2016   Procedure: BiV  Pacemaker Insertion CRT-P;  Surgeon: Thompson Grayer, MD;  Location: [place] CV LAB;  Service: Cardiovascular;  Laterality: N/A;  . CARDIAC CATHETERIZATION N/A 01/06/2016   Procedure: Right/Left Heart Cath and Coronary Angiography;  Surgeon: Belva Crome, MD;  Location: [place] CV LAB;  Service: Cardiovascular;  Laterality: N/A;  . CARDIOVERSION N/A 01/11/2016   Procedure: CARDIOVERSION;  Surgeon: Larey Dresser, MD;  Location: [facility] ENDOSCOPY;  Service: Cardiovascular;  Laterality: N/A;  . cataracts    . COLONOSCOPY N/A 10/13/2015   Procedure: COLONOSCOPY;  Surgeon: Manus Gunning, MD;  Location: [facility] ENDOSCOPY;  Service: Gastroenterology;  Laterality: N/A;  . CORONARY ANGIOPLASTY     Status post PTCA and stenting of the first obtuse marginal-05/13/2006. We placed a 3.0 x 20 mm Taxus stent. It was post dilated using a 3.25 mm noncompliant balloon up to 14 atmospheres  . TONSILLECTOMY      ROS- all systems are reviewed and negative except as per HPI above  Current Outpatient Prescriptions  Medication Sig Dispense Refill  . ACCU-CHEK FASTCLIX LANCETS MISC USE TO CHECK BLOOD SUGAR TWICE DAILY  5  . ACCU-CHEK SMARTVIEW test strip     . acetaminophen (TYLENOL) 325 MG tablet Take 2 tablets (650 mg total) by mouth every 4 (four) hours as needed for headache or mild pain.    [name] apixaban (ELIQUIS) 2.5 MG TABS  tablet Take 1 tablet (2.5 mg total) by mouth 2 (two) times daily. 180 tablet 3  . atorvastatin (LIPITOR) 20 MG tablet Take 1 tablet (20 mg total) by mouth daily. Please call and schedule a 3 month follow up appointment with Dr Acie Fredrickson per last office visit 90 tablet 2  . bisoprolol (ZEBETA) 5 MG tablet Take 1 tablet (5 mg total) by mouth daily. 30 tablet 6  . cephALEXin (KEFLEX) 500 MG capsule Take 1 capsule (500 mg total) by mouth 2 (two) times daily. 14 capsule 0  . Cholecalciferol (VITAMIN D) 2000 units tablet Take 2,000 Units by mouth daily.    . digoxin (LANOXIN) 0.125 MG tablet Take  0.5 tablets (0.0625 mg total) by mouth every other day. 45 tablet 2  . furosemide (LASIX) 20 MG tablet Take 20 mg (1 tab) once daily alternating with 40 mg (2 tabs) the next day continuously. 90 tablet 3  . metFORMIN (GLUCOPHAGE) 500 MG tablet Take 500 mg by mouth daily with supper.     . nitroGLYCERIN (NITROSTAT) 0.4 MG SL tablet Place 1 tablet (0.4 mg total) under the tongue every 5 (five) minutes x 3 doses as needed for chest pain. (Patient not taking: Reported on 10/29/2016) 25 tablet 3  . spironolactone (ALDACTONE) 25 MG tablet Take 1 tablet (25 mg total) by mouth daily. 30 tablet 6  . vitamin C (ASCORBIC ACID) 250 MG tablet Take 250 mg by mouth daily. Veggie cap with 250mg  vitamin C and 250mg  of cayenne     No current facility-administered medications for this visit.     Physical Exam: There were no vitals filed for this visit.  GEN- The patient is elderly appearing, alert and oriented x 3 today.   Head- normocephalic, atraumatic Eyes-  Sclera clear, conjunctiva pink Ears- hearing intact Oropharynx- clear Lungs- normal work of breathing Chest- pacemaker pocket hematoma is noted.  No redness, tenderness or warmth.  The hematoma is modest in size.  There is mild heme drainage but no purulence. Heart- Regular rate and rhythm (paced) Extremities- no clubbing, cyanosis, or edema  Assessment and Plan:  1. Pacemaker pocket hematoma She has developed modest hematoma with drainage on eliquis. Eliquis has been discontinued.  Though she is not actively bleeding, I worry that risks of infection are quite high.  Though she is not dependant, her conduction is very slow s/p av nodal ablation.  I have spoken with Dr Lovena Le.  He and I agree that hematoma evacuation with pocket revision and submuscular device placement may be the best option.  The patient will therefore to proceed with this procedure later today.  Thompson Grayer MD, [facility] 11/02/2016 1:13 PM

## 2016-11-03 ENCOUNTER — Encounter (HOSPITAL_COMMUNITY): Payer: Self-pay | Admitting: *Deleted

## 2016-11-03 DIAGNOSIS — T82897A Other specified complication of cardiac prosthetic devices, implants and grafts, initial encounter: Secondary | ICD-10-CM | POA: Diagnosis not present

## 2016-11-03 DIAGNOSIS — I252 Old myocardial infarction: Secondary | ICD-10-CM | POA: Diagnosis not present

## 2016-11-03 DIAGNOSIS — Z950 Presence of cardiac pacemaker: Secondary | ICD-10-CM | POA: Diagnosis not present

## 2016-11-03 DIAGNOSIS — I482 Chronic atrial fibrillation: Secondary | ICD-10-CM | POA: Diagnosis not present

## 2016-11-03 DIAGNOSIS — I2510 Atherosclerotic heart disease of native coronary artery without angina pectoris: Secondary | ICD-10-CM | POA: Diagnosis not present

## 2016-11-03 DIAGNOSIS — E119 Type 2 diabetes mellitus without complications: Secondary | ICD-10-CM | POA: Diagnosis not present

## 2016-11-03 DIAGNOSIS — L7632 Postprocedural hematoma of skin and subcutaneous tissue following other procedure: Secondary | ICD-10-CM | POA: Diagnosis not present

## 2016-11-03 DIAGNOSIS — E785 Hyperlipidemia, unspecified: Secondary | ICD-10-CM | POA: Diagnosis not present

## 2016-11-03 DIAGNOSIS — G8918 Other acute postprocedural pain: Secondary | ICD-10-CM | POA: Diagnosis not present

## 2016-11-03 LAB — GLUCOSE, CAPILLARY
GLUCOSE-CAPILLARY: 152 mg/dL — AB (ref 65–99)
Glucose-Capillary: 104 mg/dL — ABNORMAL HIGH (ref 65–99)

## 2016-11-03 MED ORDER — ACETAMINOPHEN 325 MG PO TABS
650.0000 mg | ORAL_TABLET | Freq: Four times a day (QID) | ORAL | Status: DC | PRN
Start: 2016-11-03 — End: 2016-11-03
  Administered 2016-11-03: 650 mg via ORAL
  Filled 2016-11-03: qty 2

## 2016-11-03 NOTE — Progress Notes (Signed)
Progress Note  Patient Name: Shannon [name] Date of Encounter: 11/03/2016   Subjective   Mild chest soreness, otherwise doing well.   Inpatient Medications    Scheduled Meds: . atorvastatin  20 mg Oral q1800  . bisoprolol  5 mg Oral Daily  . cephALEXin  500 mg Oral BID  . digoxin  0.0625 mg Oral QODAY  . fentaNYL (SUBLIMAZE) injection  25 mcg Intravenous Once  . furosemide  40 mg Oral Daily  . sodium chloride flush  3 mL Intravenous Q12H  . spironolactone  25 mg Oral Daily   Continuous Infusions: . sodium chloride     PRN Meds: sodium chloride, acetaminophen, nitroGLYCERIN, ondansetron (ZOFRAN) IV, sodium chloride flush   Vital Signs    Vitals:   11/03/16 0025 11/03/16 0413 11/03/16 0738 11/03/16 1206  BP: 109/70 108/73 96/65 100/65  Pulse: 69 70 70 73  Resp:   14 18  Temp: 98 F (36.7 C) 97.5 F (36.4 C) 98.1 F (36.7 C) 98.2 F (36.8 C)  TempSrc: Oral Oral Oral Oral  SpO2: 97% 94% 100% 100%  Weight: 122 lb 12.8 oz (55.7 kg) 124 lb (56.2 kg)    Height: 5\' 2"  (1.575 m)       Intake/Output Summary (Last 24 hours) at 11/03/16 1346 Last data filed at 11/03/16 1321  Gross per 24 hour  Intake              670 ml  Output              790 ml  Net             -120 ml   Filed Weights   11/02/16 1318 11/03/16 0025 11/03/16 0413  Weight: 123 lb (55.8 kg) 122 lb 12.8 oz (55.7 kg) 124 lb (56.2 kg)    Telemetry     - Personally Reviewed  ECG     - Personally Reviewed  Physical Exam   GEN: No acute distress.   Neck: No JVD Cardiac: RRR, no murmurs, rubs, or gallops.  Respiratory: Clear to auscultation bilaterally. GI: Soft, nontender, non-distended  MS: No edema; No deformity. Neuro:  Nonfocal  Psych: Normal affect   Labs    Chemistry Recent Labs Lab 10/29/16 1456  NA 139  K 4.0  CL 102  CO2 27  GLUCOSE 113*  BUN 42*  CREATININE 1.63*  CALCIUM 9.7  GFRNONAA 28*  GFRAA 33*  ANIONGAP 10     Hematology Recent Labs Lab  10/29/16 1456  WBC 9.3  RBC 3.17*  HGB 10.9*  HCT 32.1*  MCV 101.3*  MCH 34.4*  MCHC 34.0  RDW 15.7*  PLT 174    Cardiac EnzymesNo results for input(s): TROPONINI in the last 168 hours. No results for input(s): TROPIPOC in the last 168 hours.   BNPNo results for input(s): BNP, PROBNP in the last 168 hours.   DDimer No results for input(s): DDIMER in the last 168 hours.   Radiology    No results found.  Cardiac Studies    Patient Profile     83 y.o. female history of AV nodal ablation and PPM implant in April, subsequently developed a pocket hematoma on eliquis.   Assessment & Plan    1. Pacemaker pocket hematoma - AV nodal ablation and PPM implant in April, subsequently developed a pocket hematoma on eliquis.  - 11/02/16 pocket hematoma evacuation with relocation of pacemaker to subpectoral location - discussed with Dr Caryl Comes. We will plan for  discharge today, keep pressure dressing in place until outpatient follow up in device clinic for Wednesday. Remain off eliquis at this time, possibly start after her device and wound check on Wednesday. Would continue keflex additional 5 days.    Merrily Pew, MD  11/03/2016, 1:46 PM

## 2016-11-03 NOTE — Discharge Summary (Signed)
Discharge Summary    Patient ID: CALIA NAPP,  MRN: [id number], DOB/AGE: [birth year] 83 y.o.  Admit date: 11/02/2016 Discharge date: 11/03/2016  Primary Care Provider: Haywood Pao Primary Cardiologist: Dr. Acie Fredrickson Electrophysiologist: Dr. Rayann Heman   Discharge Diagnoses    Active Problems:   Pacemaker pocket hematoma   Post-operative pain   Allergies Allergies  Allergen Reactions  . Amiodarone Anaphylaxis  . Ranexa [Ranolazine] Nausea And Vomiting  . Other Other (See Comments)    Vitamin E - causes heart rate to increase  . Demerol Other (See Comments)    Burning sensation all over body  . Erythromycin Nausea And Vomiting  . Penicillins Other (See Comments)    unknown  . Percocet [Oxycodone-Acetaminophen] Swelling    Diagnostic Studies/Procedures    Pacemaker pocket hematoma evacuation with relocation of the pacemaker pocket. See op note.    History of Present Illness     83 y.o.femalewho presents to EP clinic 11/02/16 for urgent device followup. She underwent AV nodal ablation and PPM implant by Dr. Rayann Heman in early April. She did well initially. She subsequently developed a pocket hematoma on Eliquis. She was seen by Dr. Rayann Heman, earlier in the week, and the hematoma was noted to be large. Eliquis was discontinued and she was started on keflex 500mg  BID. She has had no symptoms of infection. She feels "much better" s/p AV nodal ablation. However, given the size of her hematoma, she was admitted for evacuation of the pocket.   Hospital Course     Pt presented to [facility] on 11/02/16 for the procedure, performed by Dr. Lovena Le. She underwent successful pacemaker pocket hematoma evacuation with relocation of the pacemaker pocket to a subpectoral location. She tolerated this well. She was monitored overnight and had no post op complications. She was seen by Dr. Harl Bowie and was stable. He discussed patient case with Dr. Caryl Comes who also agreed that she was stable for  discharge home. Dr. Caryl Comes recommended her pressure dressing be kept in place until outpatient f/u in device clinic on 11/07/16. She will remain off of Eliquis at this time. Possibly start after her device and wound check on Wednesday. Continue Keflex for an additional 5 days.   Consultants: none     Discharge Vitals Blood pressure 100/65, pulse 73, temperature 98.2 F (36.8 C), temperature source Oral, resp. rate 18, height 5\' 2"  (1.575 m), weight 124 lb (56.2 kg), SpO2 100 %.  Filed Weights   11/02/16 1318 11/03/16 0025 11/03/16 0413  Weight: 123 lb (55.8 kg) 122 lb 12.8 oz (55.7 kg) 124 lb (56.2 kg)    Labs & Radiologic Studies    CBC No results for input(s): WBC, NEUTROABS, HGB, HCT, MCV, PLT in the last 72 hours. Basic Metabolic Panel No results for input(s): NA, K, CL, CO2, GLUCOSE, BUN, CREATININE, CALCIUM, MG, PHOS in the last 72 hours. Liver Function Tests No results for input(s): AST, ALT, ALKPHOS, BILITOT, PROT, ALBUMIN in the last 72 hours. No results for input(s): LIPASE, AMYLASE in the last 72 hours. Cardiac Enzymes No results for input(s): CKTOTAL, CKMB, CKMBINDEX, TROPONINI in the last 72 hours. BNP Invalid input(s): POCBNP D-Dimer No results for input(s): DDIMER in the last 72 hours. Hemoglobin A1C No results for input(s): HGBA1C in the last 72 hours. Fasting Lipid Panel No results for input(s): CHOL, HDL, LDLCALC, TRIG, CHOLHDL, LDLDIRECT in the last 72 hours. Thyroid Function Tests No results for input(s): TSH, T4TOTAL, T3FREE, THYROIDAB in the last 72 hours.  Invalid input(s): FREET3 _____________  No results found. Disposition   83 is being discharged home today in good condition.  Follow-up Plans & Appointments    Follow-up Information    [facility] up.   Specialty:  Cardiology Why:  our office will call you on Monday with an appointment for Wednesday 11/07/16 to recheck your wound. Do Not Remove your bandage. This will be  removed on Wednesday by our staff in the device clinic.  Contact information: [address], [place] [phone]         Discharge Instructions    Diet - low sodium heart healthy    Complete by:  As directed    Increase activity slowly    Complete by:  As directed       Discharge Medications   Current Discharge Medication List    CONTINUE these medications which have NOT CHANGED   Details  acetaminophen (TYLENOL) 325 MG tablet Take 2 tablets (650 mg total) by mouth every 4 (four) hours as needed for headache or mild pain.    atorvastatin (LIPITOR) 20 MG tablet Take 1 tablet (20 mg total) by mouth daily. Please call and schedule a 3 month follow up appointment with Dr Acie Fredrickson per last office visit Qty: 90 tablet, Refills: 2    bisoprolol (ZEBETA) 5 MG tablet Take 1 tablet (5 mg total) by mouth daily. Qty: 30 tablet, Refills: 6    cephALEXin (KEFLEX) 500 MG capsule Take 1 capsule (500 mg total) by mouth 2 (two) times daily. Qty: 14 capsule, Refills: 0    Cholecalciferol (VITAMIN D) 2000 units tablet Take 2,000 Units by mouth daily.    digoxin (LANOXIN) 0.125 MG tablet Take 0.5 tablets (0.0625 mg total) by mouth every other day. Qty: 45 tablet, Refills: 2    furosemide (LASIX) 20 MG tablet Take 20 mg (1 tab) once daily alternating with 40 mg (2 tabs) the next day continuously. Qty: 90 tablet, Refills: 3    HYDROcodone-acetaminophen (NORCO/VICODIN) 5-325 MG tablet Take 1 tablet by mouth daily as needed for pain.    metFORMIN (GLUCOPHAGE) 500 MG tablet Take 500 mg by mouth daily with supper.     nitroGLYCERIN (NITROSTAT) 0.4 MG SL tablet Place 1 tablet (0.4 mg total) under the tongue every 5 (five) minutes x 3 doses as needed for chest pain. Qty: 25 tablet, Refills: 3    spironolactone (ALDACTONE) 25 MG tablet Take 1 tablet (25 mg total) by mouth daily. Qty: 30 tablet, Refills: 6    vitamin C (ASCORBIC ACID) 250 MG tablet Take  250 mg by mouth daily. Veggie cap with 250mg  vitamin C and 250mg  of cayenne    ACCU-CHEK FASTCLIX LANCETS MISC USE TO CHECK BLOOD SUGAR TWICE DAILY Refills: 5    ACCU-CHEK SMARTVIEW test strip       STOP taking these medications     apixaban (ELIQUIS) 2.5 MG TABS tablet            Outstanding Labs/Studies   Wound check 11/07/16  Duration of Discharge Encounter   Greater than 30 minutes including physician time.  Signed, Lyda Jester PA-C 11/03/2016, 2:29 PM

## 2016-11-03 NOTE — Discharge Instructions (Signed)
Supplemental Discharge Instructions for  Pacemaker/Defibrillator Patients  Activity Keep BAnDAGE on. Do Not Remove. This will be Removed at your Wound check Follow-up on 11/07/16 No heavy lifting or vigorous activity with your left/right arm for 6 to 8 weeks.  Do not raise your left/right arm above your head for one week.  Gradually raise your affected arm as drawn below.               11/05/16                      11/07/16                     11/09/16                    11/11/16       NO DRIVING for  7 days   ; you may begin driving on     2/77/41   . WOUND CARE - Keep the wound area clean and dry.  Do not get this area wet for one week. No showers for one week; you may shower on      11/10/16       . - The tape/steri-strips on your wound will fall off; do not pull them off.  No bandage is needed on the site.  DO  NOT apply any creams, oils, or ointments to the wound area. - If you notice any drainage or discharge from the wound, any swelling or bruising at the site, or you develop a fever > 101? F after you are discharged home, call the office at once.  Special Instructions - You are still able to use cellular telephones; use the ear opposite the side where you have your pacemaker/defibrillator.  Avoid carrying your cellular phone near your device. - When traveling through airports, show security personnel your identification card to avoid being screened in the metal detectors.  Ask the security personnel to use the hand wand. - Avoid arc welding equipment, MRI testing (magnetic resonance imaging), TENS units (transcutaneous nerve stimulators).  Call the office for questions about other devices. - Avoid electrical appliances that are in poor condition or are not properly grounded. - Microwave ovens are safe to be near or to operate.  Additional information for defibrillator patients should your device go off: - If your device goes off ONCE and you feel fine afterward, notify the device clinic  nurses. - If your device goes off ONCE and you do not feel well afterward, call 911. - If your device goes off TWICE, call 911. - If your device goes off THREE times in one day, call 911.  DO NOT DRIVE YOURSELF OR A FAMILY MEMBER WITH A DEFIBRILLATOR TO THE HOSPITAL--CALL 911.  Continue your antibiotic, Keflex for 5 more days.

## 2016-11-05 ENCOUNTER — Other Ambulatory Visit: Payer: Self-pay | Admitting: Internal Medicine

## 2016-11-05 ENCOUNTER — Encounter (HOSPITAL_COMMUNITY): Payer: Self-pay | Admitting: Internal Medicine

## 2016-11-05 ENCOUNTER — Other Ambulatory Visit: Payer: Self-pay | Admitting: *Deleted

## 2016-11-05 MED ORDER — CEPHALEXIN 500 MG PO CAPS: 500.0000 mg | ORAL_CAPSULE | Freq: Two times a day (BID) | ORAL | 0 refills | Status: DC

## 2016-11-05 NOTE — Telephone Encounter (Signed)
Patient ID: Shannon [name],  MRN: [id number], DOB/AGE: Apr 20, 1932 83 y.o.  Admit date: 11/02/2016 Discharge date: 11/03/2016  Primary Care Provider: Haywood Pao Primary Cardiologist: Dr. Acie Fredrickson Electrophysiologist: Dr. Rayann Heman   Discharge Diagnoses    Active Problems:   Pacemaker pocket hematoma   Post-operative pain   Allergies      Allergies  Allergen Reactions  . Amiodarone Anaphylaxis  . Ranexa [Ranolazine] Nausea And Vomiting  . Other Other (See Comments)    Vitamin E - causes heart rate to increase  . Demerol Other (See Comments)    Burning sensation all over body  . Erythromycin Nausea And Vomiting  . Penicillins Other (See Comments)    unknown  . Percocet [Oxycodone-Acetaminophen] Swelling    Diagnostic Studies/Procedures    Pacemaker pocket hematoma evacuation with relocation of the pacemaker pocket. See op note.    History of Present Illness     83 y.o.femalewho presents to EP clinic 11/02/16 for urgent device followup. She underwent AV nodal ablation and PPM implant by Dr. Rayann Heman in early April. She did well initially. She subsequently developed a pocket hematoma on Eliquis. She was seen by Dr. Rayann Heman, earlier in the week, and the hematoma was noted to be large. Eliquis was discontinued and she was started on keflex 500mg  BID. She has had no symptoms of infection. She feels "much better" s/p AV nodal ablation. However, given the size of her hematoma, she was admitted for evacuation of the pocket.   Hospital Course     Pt presented to [facility] on 11/02/16 for the procedure, performed by Dr. Lovena Le. She underwent successful pacemaker pocket hematoma evacuation with relocation of the pacemaker pocket to a subpectoral location. She tolerated this well. She was monitored overnight and had no post op complications. She was seen by Dr. Harl Bowie and was stable. He discussed patient case with Dr. Caryl Comes who also agreed that she was stable for discharge  home. Dr. Caryl Comes recommended her pressure dressing be kept in place until outpatient f/u in device clinic on 11/07/16. She will remain off of Eliquis at this time. Possibly start after her device and wound check on Wednesday. Continue Keflex for an additional 5 days.

## 2016-11-05 NOTE — Telephone Encounter (Signed)
Ok to fill.  Should have been given rx at discharge.

## 2016-11-07 ENCOUNTER — Ambulatory Visit (INDEPENDENT_AMBULATORY_CARE_PROVIDER_SITE_OTHER): Payer: Medicare HMO | Admitting: *Deleted

## 2016-11-07 ENCOUNTER — Telehealth: Payer: Self-pay | Admitting: *Deleted

## 2016-11-07 DIAGNOSIS — I2510 Atherosclerotic heart disease of native coronary artery without angina pectoris: Secondary | ICD-10-CM

## 2016-11-07 DIAGNOSIS — T462X5A Adverse effect of other antidysrhythmic drugs, initial encounter: Secondary | ICD-10-CM | POA: Diagnosis not present

## 2016-11-07 DIAGNOSIS — I442 Atrioventricular block, complete: Secondary | ICD-10-CM | POA: Diagnosis not present

## 2016-11-07 DIAGNOSIS — R1312 Dysphagia, oropharyngeal phase: Secondary | ICD-10-CM | POA: Diagnosis not present

## 2016-11-07 DIAGNOSIS — I5021 Acute systolic (congestive) heart failure: Secondary | ICD-10-CM | POA: Diagnosis not present

## 2016-11-07 DIAGNOSIS — E0800 Diabetes mellitus due to underlying condition with hyperosmolarity without nonketotic hyperglycemic-hyperosmolar coma (NKHHC): Secondary | ICD-10-CM

## 2016-11-07 DIAGNOSIS — R0602 Shortness of breath: Secondary | ICD-10-CM | POA: Diagnosis not present

## 2016-11-07 DIAGNOSIS — J984 Other disorders of lung: Secondary | ICD-10-CM

## 2016-11-07 DIAGNOSIS — I483 Typical atrial flutter: Secondary | ICD-10-CM

## 2016-11-07 DIAGNOSIS — M6281 Muscle weakness (generalized): Secondary | ICD-10-CM | POA: Diagnosis not present

## 2016-11-07 DIAGNOSIS — Z5189 Encounter for other specified aftercare: Secondary | ICD-10-CM | POA: Diagnosis not present

## 2016-11-07 DIAGNOSIS — J9601 Acute respiratory failure with hypoxia: Secondary | ICD-10-CM | POA: Diagnosis not present

## 2016-11-07 DIAGNOSIS — R269 Unspecified abnormalities of gait and mobility: Secondary | ICD-10-CM | POA: Diagnosis not present

## 2016-11-07 MED ORDER — CEPHALEXIN 500 MG PO CAPS: 500.0000 mg | ORAL_CAPSULE | Freq: Two times a day (BID) | ORAL | 0 refills | Status: AC

## 2016-11-07 NOTE — Telephone Encounter (Signed)
Late entry: at [place] to call back to device clinic regarding appt today.

## 2016-11-07 NOTE — Addendum Note (Signed)
Addended by: Eulis Foster on: 11/07/2016 11:52 AM   Modules accepted: Orders

## 2016-11-07 NOTE — Addendum Note (Signed)
Addended by: Ranee Gosselin on: 11/07/2016 12:26 PM   Modules accepted: Level of Service

## 2016-11-07 NOTE — Telephone Encounter (Signed)
Ms. Cutright calling back. I asked if she could come earlier today while Dr. Curt Bears is in the office this morning to evaluate hematoma evacuation site. She reports that due to transportation she will be "pushing it" to make it to her scheduled 11:30am appointment today with the device clinic.

## 2016-11-07 NOTE — Progress Notes (Signed)
Shannon [name] presents to the [facility] today for wound check s/p device pocket evacuation. Pressure dressing and steri-strips removed, incision edges approximated. Swelling decreased significantly, pocket soft upon palpation. No ecchymosis or drainage noted. Patient instructed to shower and cleanse area with soap and water and limit lifting with left arm to < 10 pounds. She was advised to call the [facility] with any concerns about incision (redness, swelling, drainage) or questions. Spoke with Dr. Rayann Heman via phone and he recommended continuing to hold Eliquis until 11/14/16 appt with Chanetta Marshall, NP. Discharge instructions given 11/03/16 say to continue Keflex Rx for 5 more days but not Rx was given to the patient or sent to pharmacy- she completed initial 7 day Rx yesterday morning 11/06/16. Dr. Rayann Heman gave a VO for Keflex 500mg  BID for an additional 7 days. Chanetta Marshall, NP to give recommendations about Eliquis/ antibiotic at 11/14/16 appt.  Patient sent to lab for BMet per Dr. [name].

## 2016-11-08 ENCOUNTER — Other Ambulatory Visit (HOSPITAL_COMMUNITY): Payer: Medicare HMO

## 2016-11-08 LAB — BASIC METABOLIC PANEL
BUN/Creatinine Ratio: 32 — ABNORMAL HIGH (ref 12–28)
BUN: 43 mg/dL — ABNORMAL HIGH (ref 8–27)
CALCIUM: 9.6 mg/dL (ref 8.7–10.3)
CHLORIDE: 97 mmol/L (ref 96–106)
CO2: 23 mmol/L (ref 18–29)
Creatinine, Ser: 1.34 mg/dL — ABNORMAL HIGH (ref 0.57–1.00)
GFR calc Af Amer: 42 mL/min/{1.73_m2} — ABNORMAL LOW (ref >59)
GFR, EST NON AFRICAN AMERICAN: 37 mL/min/{1.73_m2} — AB (ref >59)
GLUCOSE: 99 mg/dL (ref 65–99)
POTASSIUM: 4.1 mmol/L (ref 3.5–5.2)
SODIUM: 141 mmol/L (ref 134–144)

## 2016-11-12 NOTE — Progress Notes (Signed)
Electrophysiology Office Note Date: 11/14/2016  ID:  Shannon [name], DOB [birth year], MRN [id number]  PCP: Haywood Pao, MD Primary Cardiologist: Nah[place] Heart Failure: [name] Electrophysiologist: [name]   CC: PPM/HF follow up  Shannon [name] is a 83 y.o. female seen today for Dr Rayann Heman.  She presents today for routine electrophysiology followup. She underwent AVN ablation and device implant in April of this year by Dr Rayann Heman. She subsequently developed a pocket hematoma which required evacuation on 11/02/16.  Since then, the patient reports doing very well.  She denies chest pain, palpitations, dyspnea, PND, orthopnea, nausea, vomiting, dizziness, syncope, edema, weight gain, or early satiety.  Device History: STJ CRTP implanted 2018 for CHF and permanent AF (AVN ablation at that time); pocket hematoma evacuation post procedure with device placed sub-pec   Past Medical History:  Diagnosis Date  . Arthritis   . Baker's cyst of knee   . CAD (coronary artery disease)    a. 05/2006 Cath/PCI: LAD 58m, D1 40 ost, LCX nl, OM1 95 (3.0x20 Taxus DES), RCA nl, EF 40% w/ lat AK;  04/2007 Ex MV: minimal lat ischemia in area of prior infarct->low risk-> med Rx.  . Chronic atrial fibrillation (HCC)    a. on Coumadin  . Chronic systolic (congestive) heart failure ([place])    a. 04/2015: EF 50-55% b. echo 12/2015: EF 20-25% w/ diffuse HK, biatrial enlargement, mild AI and MR, severe TR    . Diabetes mellitus   . Hyperlipidemia   . MVP (mitral valve prolapse)    a. 10/2003 Echo: nl LV fxn, mild MR/TR/AS  . Myocardial infarction ([place])   . Sleep apnea   . Transient ischemic attack    Past Surgical History:  Procedure Laterality Date  . ATRIAL FLUTTER ABLATION N/A 10/09/2012   Procedure: ATRIAL FLUTTER ABLATION;  Surgeon: Thompson Grayer, MD;  Location: [facility] CATH LAB;  Service: Cardiovascular;  Laterality: N/A;  . AV NODE ABLATION N/A 09/27/2016   Procedure: AV Node Ablation;  Surgeon: Thompson Grayer, MD;  Location: [place] CV LAB;  Service: Cardiovascular;  Laterality: N/A;  . BIV PACEMAKER INSERTION CRT-P N/A 09/27/2016   Procedure: BiV Pacemaker Insertion CRT-P;  Surgeon: Thompson Grayer, MD;  Location: [place] CV LAB;  Service: Cardiovascular;  Laterality: N/A;  . CARDIAC CATHETERIZATION N/A 01/06/2016   Procedure: Right/Left Heart Cath and Coronary Angiography;  Surgeon: Belva Crome, MD;  Location: [place] CV LAB;  Service: Cardiovascular;  Laterality: N/A;  . CARDIOVERSION N/A 01/11/2016   Procedure: CARDIOVERSION;  Surgeon: Larey Dresser, MD;  Location: [facility] ENDOSCOPY;  Service: Cardiovascular;  Laterality: N/A;  . cataracts    . COLONOSCOPY N/A 10/13/2015   Procedure: COLONOSCOPY;  Surgeon: Manus Gunning, MD;  Location: [facility] ENDOSCOPY;  Service: Gastroenterology;  Laterality: N/A;  . CORONARY ANGIOPLASTY     Status post PTCA and stenting of the first obtuse marginal-05/13/2006. We placed a 3.0 x 20 mm Taxus stent. It was post dilated using a 3.25 mm noncompliant balloon up to 14 atmospheres  . IMPLANT POCKET INCISION & DRAINAGE N/A 11/02/2016   Procedure: Implant Pocket Incision & Drainage;  Surgeon: Evans Lance, MD;  Location: [place] CV LAB;  Service: Cardiovascular;  Laterality: N/A;  . TONSILLECTOMY      Current Outpatient Prescriptions  Medication Sig Dispense Refill  . ACCU-CHEK FASTCLIX LANCETS MISC USE TO CHECK BLOOD SUGAR TWICE DAILY  5  . ACCU-CHEK SMARTVIEW test strip     . acetaminophen (  TYLENOL) 325 MG tablet Take 2 tablets (650 mg total) by mouth every 4 (four) hours as needed for headache or mild pain.    [name] atorvastatin (LIPITOR) 20 MG tablet Take 1 tablet (20 mg total) by mouth daily. Please call and schedule a 3 month follow up appointment with Dr Acie Fredrickson per last office visit 90 tablet 2  . bisoprolol (ZEBETA) 5 MG tablet Take 1 tablet (5 mg total) by mouth daily. 30 tablet 6  . cephALEXin (KEFLEX) 500 MG capsule Take 1 capsule (500 mg  total) by mouth 2 (two) times daily. 14 capsule 0  . Cholecalciferol (VITAMIN D) 2000 units tablet Take 2,000 Units by mouth daily.    . digoxin (LANOXIN) 0.125 MG tablet Take 0.5 tablets (0.0625 mg total) by mouth every other day. 45 tablet 2  . furosemide (LASIX) 20 MG tablet as directed. Take 20 mg (1 tab) once daily alternating with 40 mg (2 tabs) the next day continuously.    [name] HYDROcodone-acetaminophen (NORCO/VICODIN) 5-325 MG tablet Take 1 tablet by mouth daily as needed for pain.    . metFORMIN (GLUCOPHAGE) 500 MG tablet Take 500 mg by mouth daily with supper.     . nitroGLYCERIN (NITROSTAT) 0.4 MG SL tablet Place 1 tablet (0.4 mg total) under the tongue every 5 (five) minutes x 3 doses as needed for chest pain. 25 tablet 3  . OVER THE COUNTER MEDICATION Take 1 capsule by mouth daily. Veggie cap with 250mg  vitamin C and 250mg  of cayenne    . spironolactone (ALDACTONE) 25 MG tablet Take 1 tablet (25 mg total) by mouth daily. 30 tablet 6  . apixaban (ELIQUIS) 2.5 MG TABS tablet Take 1 tablet (2.5 mg total) by mouth 2 (two) times daily. 60 tablet 3   No current facility-administered medications for this visit.     Allergies:   Amiodarone; Ranexa [ranolazine]; Other; Demerol; Erythromycin; Penicillins; and Percocet [oxycodone-acetaminophen]   Social History: Social History   Social History  . Marital status: Married    Spouse name: N/A  . Number of children: N/A  . Years of education: N/A   Occupational History  . Not on file.   Social History Main Topics  . Smoking status: Former Smoker    Packs/day: 1.00    Types: Cigarettes    Quit date: 07/03/1963  . Smokeless tobacco: Never Used  . Alcohol use No  . Drug use: No  . Sexual activity: No   Other Topics Concern  . Not on file   Social History Narrative   Lives in [place] with husband.  Does not routinely exercise but is able to keep house and go food shopping.    Family History: Family History  Problem Relation Age of  Onset  . Heart attack Mother   . Stroke Mother   . Heart attack Father   . Heart attack Brother   . Coronary artery disease Sister        CABG  . Alzheimer's disease Sister        X3  . Stroke Sister   . Heart attack Sister      Review of Systems: All other systems reviewed and are otherwise negative except as noted above.   Physical Exam: VS:  BP 108/66   Pulse 70   Ht 5\' 2"  (1.575 m)   Wt 122 lb 9.6 oz (55.6 kg)   SpO2 99%   BMI 22.42 kg/m  , BMI Body mass index is 22.42 kg/m.  GEN- The  patient is elderly appearing, alert and oriented x 3 today.   HEENT: normocephalic, atraumatic; sclera clear, conjunctiva pink; hearing intact; oropharynx clear; neck supple Lungs- Clear to ausculation bilaterally, normal work of breathing.  No wheezes, rales, rhonchi Heart- Regular rate and rhythm (paced) GI- soft, non-tender, non-distended, bowel sounds present  Extremities- no clubbing, cyanosis, or edema MS- no significant deformity or atrophy Skin- warm and dry, no rash or lesion; PPM pocket well healed  Psych- euthymic mood, full affect Neuro- strength and sensation are intact  PPM Interrogation- reviewed in detail today,  See PACEART report  EKG:  EKG is ordered today. The ekg ordered today shows AF, V pacing, QRS 173msec  Recent Labs: 07/18/2016: ALT 14 09/04/2016: B Natriuretic Peptide 519.3; Magnesium 2.1 10/29/2016: Hemoglobin 10.9; Platelets 174 11/07/2016: BUN 43; Creatinine, Ser 1.34; Potassium 4.1; Sodium 141   Wt Readings from Last 3 Encounters:  11/14/16 122 lb 9.6 oz (55.6 kg)  11/03/16 124 lb (56.2 kg)  10/29/16 122 lb 8 oz (55.6 kg)     Other studies Reviewed: Additional studies/ records that were reviewed today include: Dr Jackalyn Lombard office notes, procedure records   Assessment and Plan:  1.  Permanent atrial fibrillation s/p AVN ablation S/p AV node ablation Symptoms improved post procedure Continue Eliquis long term for CHADS2VASC of 8 Update echo  05/2017 post CRT  2.  Complete heart block Normal PPM function See Pace Art report No changes today Pacemaker pocket well healed s/p hematoma evacuation Resume Eliquis 2.5mg  twice daily (age and weight)  3.  NICM/chronic systolic heart failure Euvolemic on exam Continue current therapy Update echo 6 months post CRT as above    Current medicines are reviewed at length with the patient today.   The patient does not have concerns regarding her medicines.  The following changes were made today:  none  Labs/ tests ordered today include: echo 05/2017 Orders Placed This Encounter  Procedures  . CUP PACEART [place]  . EKG 12-Lead     Disposition:   Follow up with Dr Rayann Heman as scheduled    Signed, Chanetta Marshall, NP 11/14/2016 10:39 AM  [address] [place] [id number] [phone] (office) [phone] (fax

## 2016-11-14 ENCOUNTER — Ambulatory Visit (INDEPENDENT_AMBULATORY_CARE_PROVIDER_SITE_OTHER): Payer: Medicare HMO | Admitting: Nurse Practitioner

## 2016-11-14 ENCOUNTER — Encounter: Payer: Self-pay | Admitting: Nurse Practitioner

## 2016-11-14 VITALS — BP 108/66 | HR 70 | Ht 62.0 in | Wt 122.6 lb

## 2016-11-14 DIAGNOSIS — I428 Other cardiomyopathies: Secondary | ICD-10-CM | POA: Diagnosis not present

## 2016-11-14 DIAGNOSIS — I5022 Chronic systolic (congestive) heart failure: Secondary | ICD-10-CM

## 2016-11-14 DIAGNOSIS — I482 Chronic atrial fibrillation: Secondary | ICD-10-CM | POA: Diagnosis not present

## 2016-11-14 DIAGNOSIS — I4821 Permanent atrial fibrillation: Secondary | ICD-10-CM

## 2016-11-14 DIAGNOSIS — I442 Atrioventricular block, complete: Secondary | ICD-10-CM | POA: Diagnosis not present

## 2016-11-14 LAB — CUP PACEART INCLINIC DEVICE CHECK
Date Time Interrogation Session: 20180516090131
Implantable Lead Implant Date: 20180329
Implantable Lead Location: 753859
Implantable Lead Location: 753860
MDC IDC LEAD IMPLANT DT: 20180329
MDC IDC LEAD IMPLANT DT: 20180329
MDC IDC LEAD LOCATION: 753860
MDC IDC PG IMPLANT DT: 20180329
Pulse Gen Serial Number: 8006933

## 2016-11-14 MED ORDER — APIXABAN 2.5 MG PO TABS
2.5000 mg | ORAL_TABLET | Freq: Two times a day (BID) | ORAL | 3 refills | Status: DC
Start: 2016-11-14 — End: 2017-04-09

## 2016-11-14 NOTE — Patient Instructions (Signed)
Medication Instructions:  Your physician has recommended you make the following change in your medication: 1.) START Eliquis 2.5 MG twice a day.   Labwork: None Ordered   Testing/Procedures: None Ordered   Follow-Up: Your physician recommends that you schedule a follow-up appointment as scheduled with Dr. Rayann Heman  Any Other Special Instructions Will Be Listed Below (If Applicable).     If you need a refill on your cardiac medications before your next appointment, please call your pharmacy.  Thank you for choosing [facility]

## 2016-12-08 DIAGNOSIS — R1312 Dysphagia, oropharyngeal phase: Secondary | ICD-10-CM | POA: Diagnosis not present

## 2016-12-08 DIAGNOSIS — M6281 Muscle weakness (generalized): Secondary | ICD-10-CM | POA: Diagnosis not present

## 2016-12-08 DIAGNOSIS — I2510 Atherosclerotic heart disease of native coronary artery without angina pectoris: Secondary | ICD-10-CM | POA: Diagnosis not present

## 2016-12-08 DIAGNOSIS — J9601 Acute respiratory failure with hypoxia: Secondary | ICD-10-CM | POA: Diagnosis not present

## 2016-12-08 DIAGNOSIS — R269 Unspecified abnormalities of gait and mobility: Secondary | ICD-10-CM | POA: Diagnosis not present

## 2016-12-08 DIAGNOSIS — Z5189 Encounter for other specified aftercare: Secondary | ICD-10-CM | POA: Diagnosis not present

## 2016-12-08 DIAGNOSIS — I5021 Acute systolic (congestive) heart failure: Secondary | ICD-10-CM | POA: Diagnosis not present

## 2016-12-08 DIAGNOSIS — R0602 Shortness of breath: Secondary | ICD-10-CM | POA: Diagnosis not present

## 2016-12-12 DIAGNOSIS — E1151 Type 2 diabetes mellitus with diabetic peripheral angiopathy without gangrene: Secondary | ICD-10-CM | POA: Diagnosis not present

## 2016-12-12 DIAGNOSIS — E559 Vitamin D deficiency, unspecified: Secondary | ICD-10-CM | POA: Diagnosis not present

## 2016-12-12 DIAGNOSIS — N183 Chronic kidney disease, stage 3 (moderate): Secondary | ICD-10-CM | POA: Diagnosis not present

## 2016-12-12 DIAGNOSIS — R8299 Other abnormal findings in urine: Secondary | ICD-10-CM | POA: Diagnosis not present

## 2016-12-12 DIAGNOSIS — E7800 Pure hypercholesterolemia, unspecified: Secondary | ICD-10-CM | POA: Diagnosis not present

## 2016-12-12 DIAGNOSIS — N390 Urinary tract infection, site not specified: Secondary | ICD-10-CM | POA: Diagnosis not present

## 2016-12-19 DIAGNOSIS — Z23 Encounter for immunization: Secondary | ICD-10-CM | POA: Diagnosis not present

## 2016-12-19 DIAGNOSIS — I208 Other forms of angina pectoris: Secondary | ICD-10-CM | POA: Diagnosis not present

## 2016-12-19 DIAGNOSIS — N183 Chronic kidney disease, stage 3 (moderate): Secondary | ICD-10-CM | POA: Diagnosis not present

## 2016-12-19 DIAGNOSIS — M5136 Other intervertebral disc degeneration, lumbar region: Secondary | ICD-10-CM | POA: Diagnosis not present

## 2016-12-19 DIAGNOSIS — I509 Heart failure, unspecified: Secondary | ICD-10-CM | POA: Diagnosis not present

## 2016-12-19 DIAGNOSIS — E46 Unspecified protein-calorie malnutrition: Secondary | ICD-10-CM | POA: Diagnosis not present

## 2016-12-19 DIAGNOSIS — E1151 Type 2 diabetes mellitus with diabetic peripheral angiopathy without gangrene: Secondary | ICD-10-CM | POA: Diagnosis not present

## 2016-12-19 DIAGNOSIS — Z0000 Encounter for general adult medical examination without abnormal findings: Secondary | ICD-10-CM | POA: Diagnosis not present

## 2016-12-19 DIAGNOSIS — Z7901 Long term (current) use of anticoagulants: Secondary | ICD-10-CM | POA: Diagnosis not present

## 2016-12-19 DIAGNOSIS — I1310 Hypertensive heart and chronic kidney disease without heart failure, with stage 1 through stage 4 chronic kidney disease, or unspecified chronic kidney disease: Secondary | ICD-10-CM | POA: Diagnosis not present

## 2016-12-19 IMAGING — CR DG FOOT 2V*L*
2 series · 2 of 2 positions shown · non-contrast
Comparison: October 05, 2015

CLINICAL DATA: Evaluate for osteomyelitis.  Great toe ulcer.

EXAM:
LEFT FOOT - 2 VIEW

[t foot ap left]
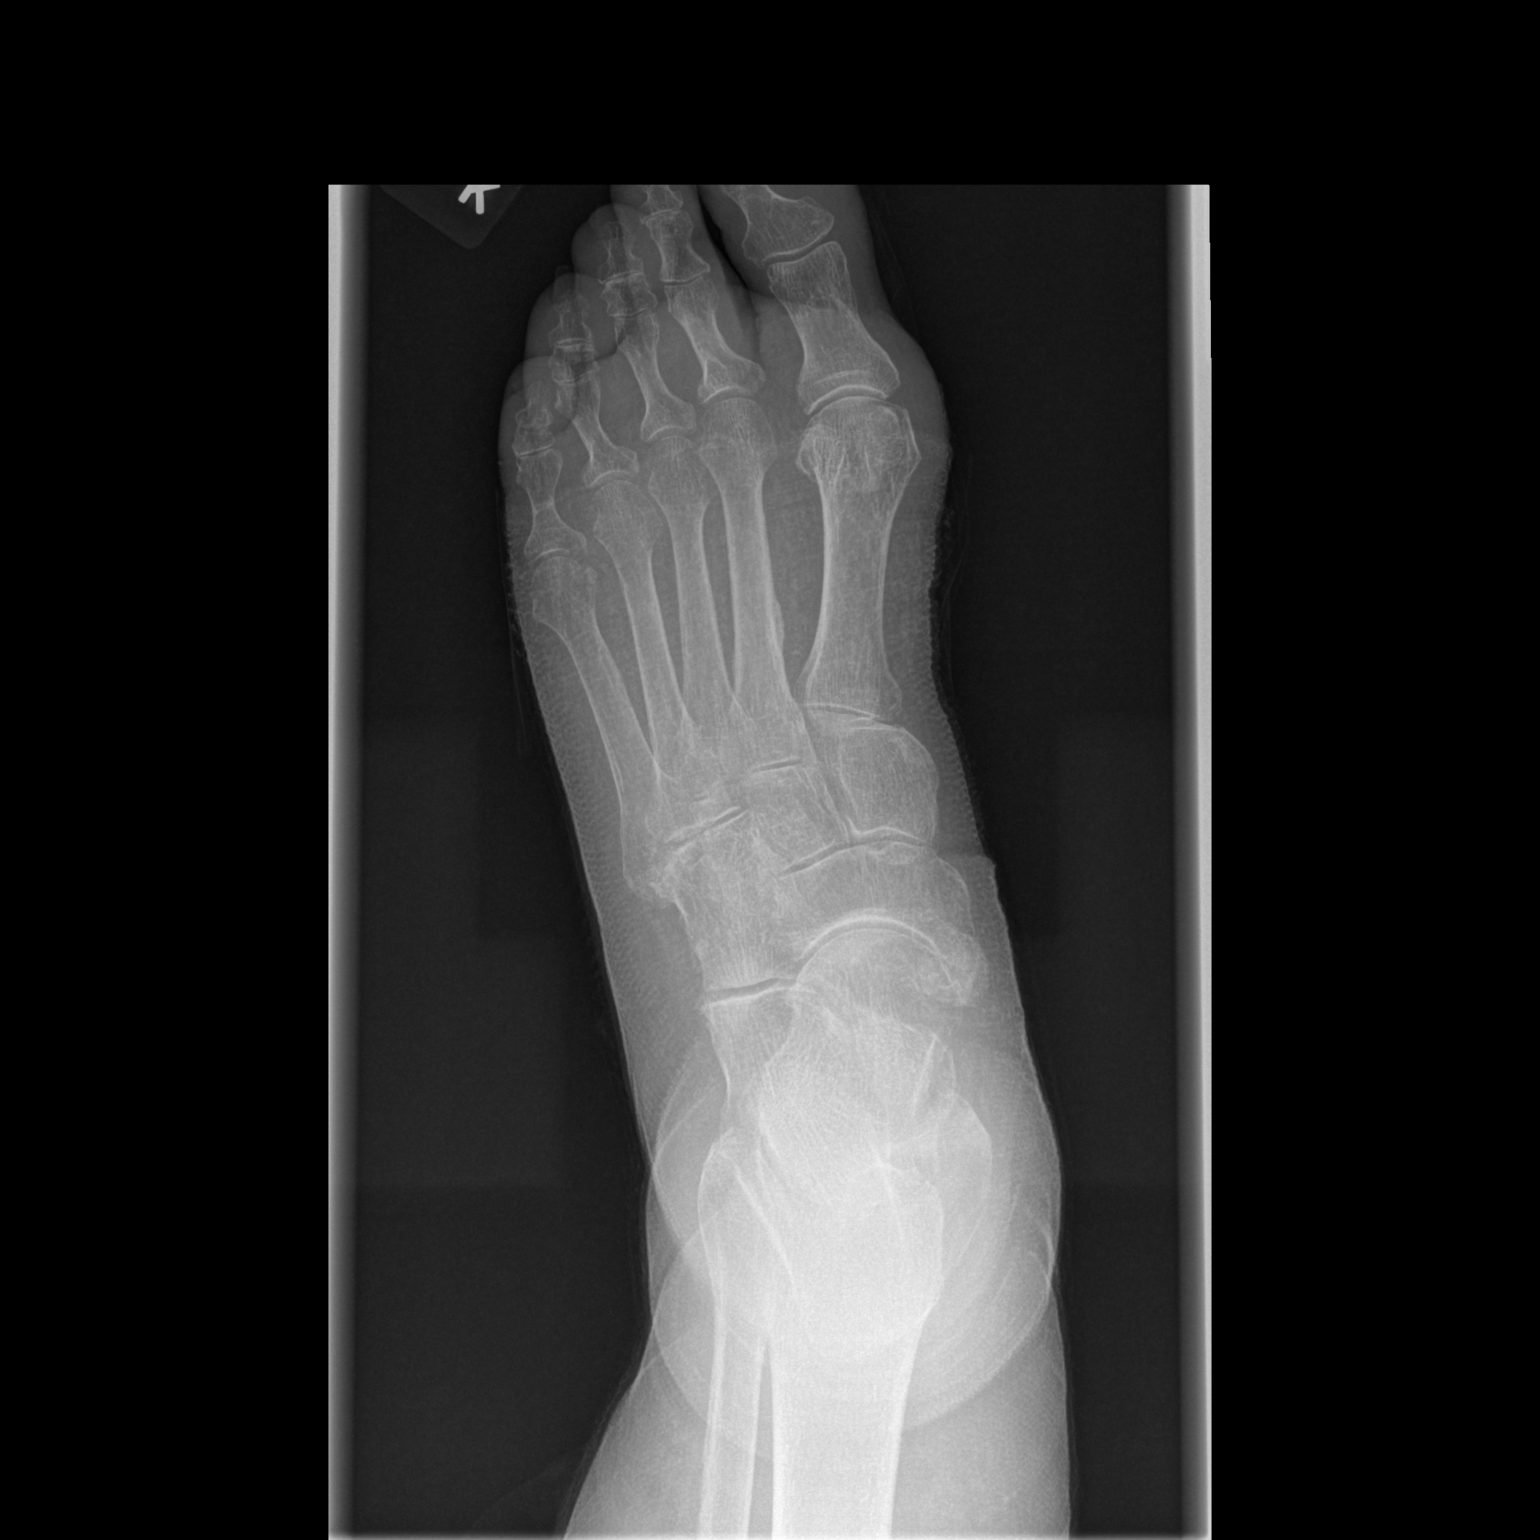

[t foot lat left]
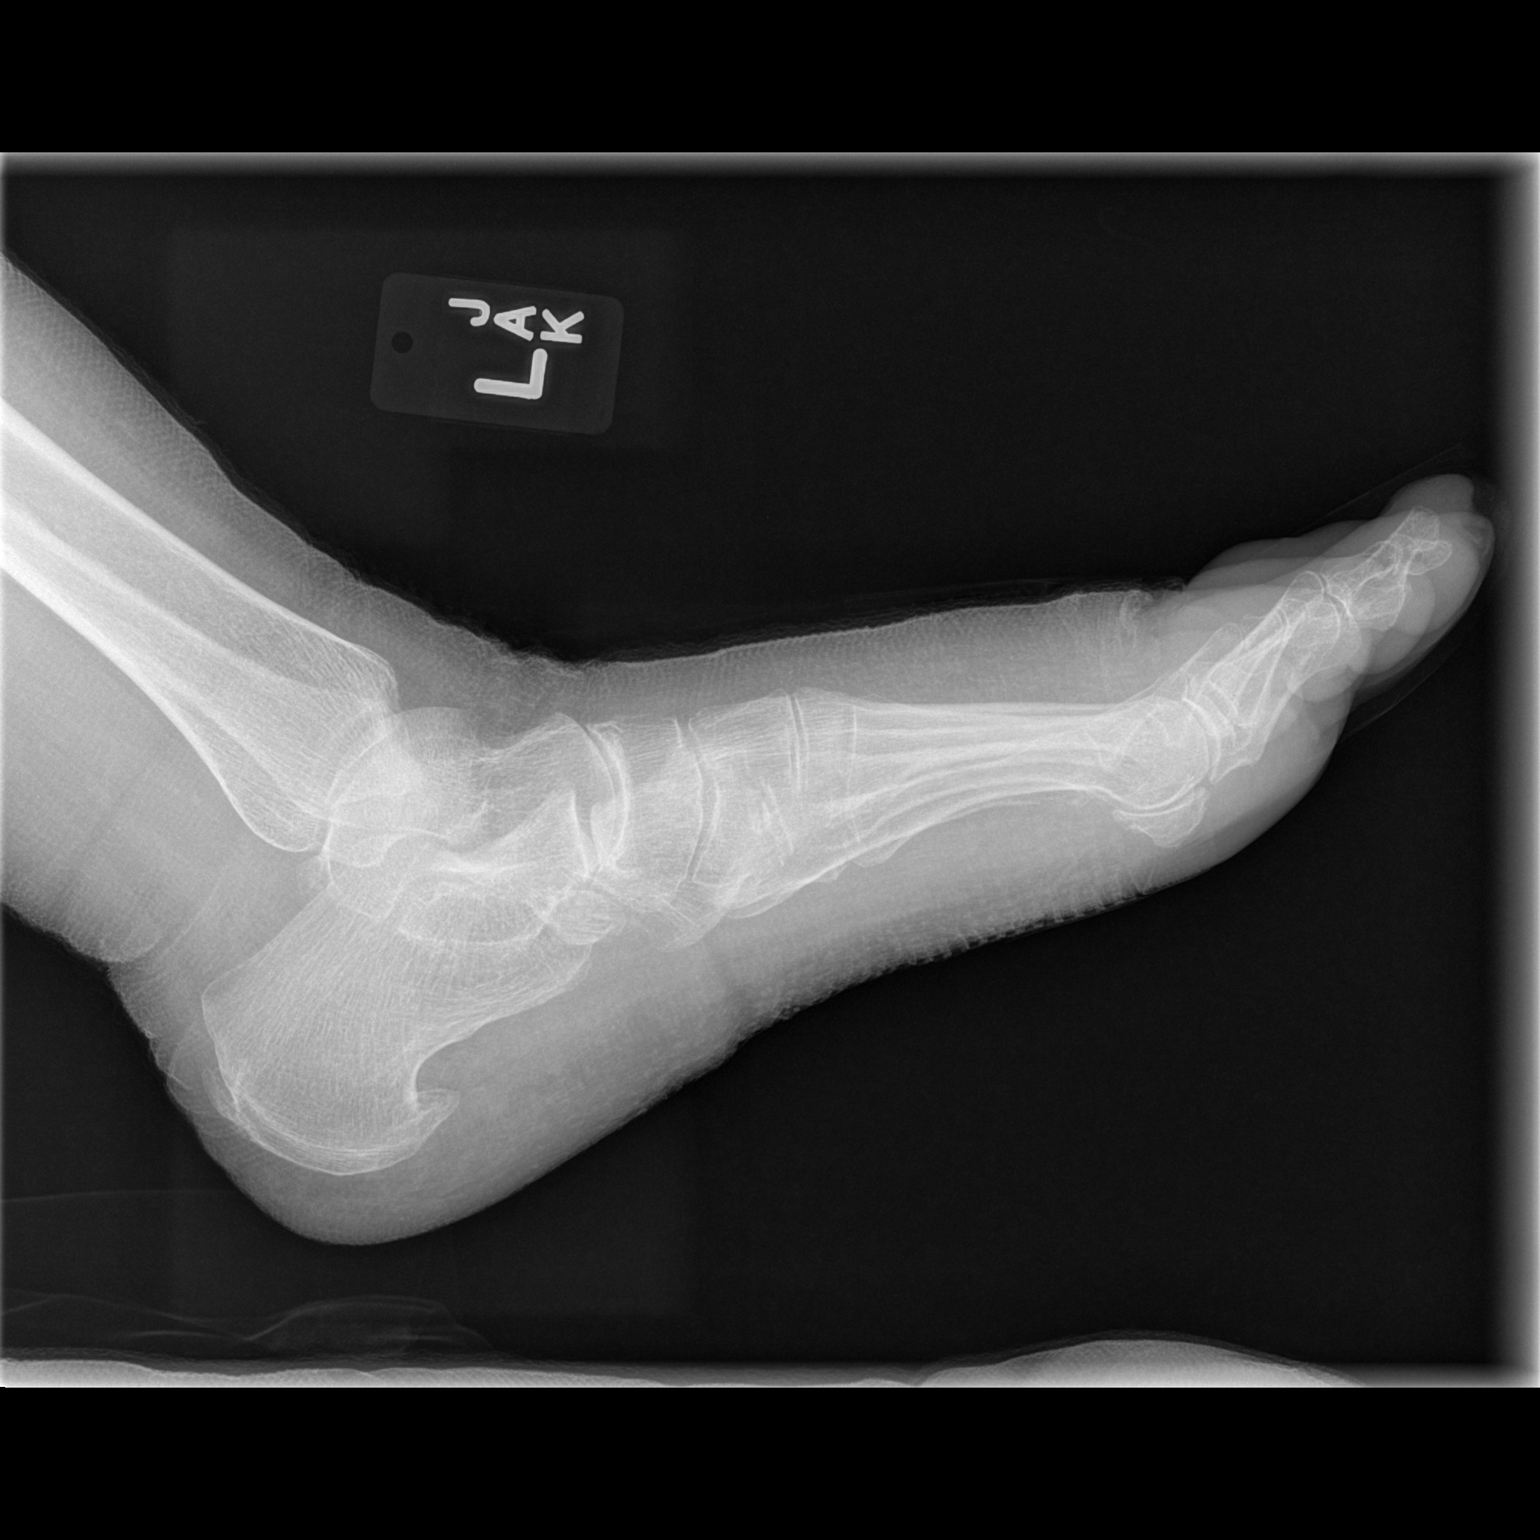

[2 of 2 positions shown; findings below may reference images not displayed]

FINDINGS: A skin defect over the great toe is consistent with the reported
ulcer. No soft tissue gas identified. Osteopenia is noted. No acute
fractures are seen.
IMPRESSION: No osteomyelitis in the great toe identified.

## 2016-12-21 ENCOUNTER — Ambulatory Visit (HOSPITAL_COMMUNITY)
Admission: RE | Admit: 2016-12-21 | Discharge: 2016-12-21 | Disposition: A | Discharge status: Home / Self Care | Payer: Medicare HMO | Source: Ambulatory Visit | Attending: Cardiology | Admitting: Cardiology

## 2016-12-21 ENCOUNTER — Encounter (HOSPITAL_COMMUNITY): Payer: Self-pay

## 2016-12-21 VITALS — BP 116/68 | HR 60 | Ht 62.0 in | Wt 123.5 lb

## 2016-12-21 DIAGNOSIS — I482 Chronic atrial fibrillation: Secondary | ICD-10-CM | POA: Diagnosis not present

## 2016-12-21 DIAGNOSIS — Z7901 Long term (current) use of anticoagulants: Secondary | ICD-10-CM | POA: Diagnosis not present

## 2016-12-21 DIAGNOSIS — E119 Type 2 diabetes mellitus without complications: Secondary | ICD-10-CM | POA: Insufficient documentation

## 2016-12-21 DIAGNOSIS — Z87891 Personal history of nicotine dependence: Secondary | ICD-10-CM | POA: Insufficient documentation

## 2016-12-21 DIAGNOSIS — Z8249 Family history of ischemic heart disease and other diseases of the circulatory system: Secondary | ICD-10-CM | POA: Diagnosis not present

## 2016-12-21 DIAGNOSIS — I5022 Chronic systolic (congestive) heart failure: Secondary | ICD-10-CM | POA: Insufficient documentation

## 2016-12-21 DIAGNOSIS — Z79899 Other long term (current) drug therapy: Secondary | ICD-10-CM | POA: Insufficient documentation

## 2016-12-21 DIAGNOSIS — I480 Paroxysmal atrial fibrillation: Secondary | ICD-10-CM | POA: Diagnosis not present

## 2016-12-21 DIAGNOSIS — I4892 Unspecified atrial flutter: Secondary | ICD-10-CM | POA: Insufficient documentation

## 2016-12-21 DIAGNOSIS — Z8673 Personal history of transient ischemic attack (TIA), and cerebral infarction without residual deficits: Secondary | ICD-10-CM | POA: Insufficient documentation

## 2016-12-21 DIAGNOSIS — Z823 Family history of stroke: Secondary | ICD-10-CM | POA: Insufficient documentation

## 2016-12-21 DIAGNOSIS — Z7984 Long term (current) use of oral hypoglycemic drugs: Secondary | ICD-10-CM | POA: Insufficient documentation

## 2016-12-21 DIAGNOSIS — I4821 Permanent atrial fibrillation: Secondary | ICD-10-CM

## 2016-12-21 DIAGNOSIS — E785 Hyperlipidemia, unspecified: Secondary | ICD-10-CM | POA: Diagnosis not present

## 2016-12-21 DIAGNOSIS — I2510 Atherosclerotic heart disease of native coronary artery without angina pectoris: Secondary | ICD-10-CM | POA: Diagnosis not present

## 2016-12-21 DIAGNOSIS — I429 Cardiomyopathy, unspecified: Secondary | ICD-10-CM | POA: Insufficient documentation

## 2016-12-21 LAB — DIGOXIN LEVEL: DIGOXIN LVL: 0.8 ng/mL (ref 0.8–2.0)

## 2016-12-21 NOTE — Patient Instructions (Signed)
Labs today (will call for abnormal results, otherwise no news is good news)  You have been referred to cardiac rehab, they will contact you for initial appointment.   Follow up in 3 Months, we will contact you to schedule appointment.

## 2016-12-23 NOTE — Progress Notes (Signed)
Patient ID: Shannon [name], female   DOB: 12/15/1931, 83 y.o.   MRN: [id number] PCP: Dr. Osborne Casco Cardiology: Dr. [name]  83 yo with paroxysmal atrial fibrillation, chronic systolic CHF, and CAD presents for cardiology followup. She apparently had been in persistent atrial fibrillation since around 8/16.  It appears that she was tachycardic most of the time prior to her recent admission in 7/17.  She had been on amiodarone prior to admission but it was not keeping her in NSR.  She was admitted with acute systolic CHF.  EF had dropped to 20-25% in 7/17 from 50-55% in 10/16.  She was in atrial fibrillation with RVR.  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  Amiodarone was stopped due to suspicion for amiodarone-induced lung toxicity (she was seen by pulmonary).  She was eventually cardioverted to NSR.  Ranolazine was started to try to keep her in NSR.  HR was low in the hospital so she has not been on a beta blocker.  She was extensively diuresed and discharged to SNF.  After discharge, she developed persistent nausea.  Eventually, it was found that the nausea was likely from ranolazine, so this medication was stopped and the nausea resolved.    Patient continued to have difficult-to-control atrial fibrillation.  GFR was too low for Tikosyn.  She therefore had AV nodal ablation with St Jude CRT-P device placed in 3/18.  She developed a pocket hematoma and had pocket revision with sub-pectoral placement in 5/18.   Stable symptomatically. No significant weight change.  No exertional dyspnea though not very active. She walks on a treadmill.  No chest pain.  No orthopnea/PND. No lightheadedness.    Labs (7/17): Digoxin 1.6 => 0.3, K 4.6, creatinine 0.87 => 0.76, HCT 34, BNP 265 Labs (9/17): K 5, creatinine 1.36 Labs (11/17): K 4.9, creatinine 1.29, hgb 9.2, digoxin 0.5 Labs (12/17): hgb 10.4, digoxin 0.4 Labs (1/18): K 4, creatinine 1.62 => 1.49, hgb 9.5, LDL  42, HDL 63 Labs (3/18): K 4.1, creatinine 1.44, digoxin 0.4 Labs (6/18): K 3.9, creatinine 1.7, hgb 11.5, LDL 56, HDL 50  PMH: 1. Atrial fibrillation: Paroxysmal.  Had been persistent since 8/16, was cardioverted to NSR during 7/17 admission.  Possible atypical atrial flutter on 12/17 ECG.  By 3/18, atrial fibrillation again.  - Nausea with ranolazine used for atrial fibrillation suppression.  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  - AV nodal ablation 3/18 with CRT-P.  2. 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  She had been in atrial fibrillation with elevated rate possibly for months prior to 7/17 admission.  - Echo (10/16): EF 50-55%.  - Echo (7/17): EF 20-25%, mildly decreased RV systolic function, severe TR, PA systolic pressure 64 mmHg.  - LHC/RHC (7/17): Patent OM stent, no obstructive CAD.  Mean RA 13, PA 45/25, mean PCWP 16, CI 2.36.  - Echo (1/18): EF 30-35% with diffuse hypokinesis, mildly decreased RV systolic function, moderate TR and MR, PASP 53 mmHg, severe LAE.  - St Jude CRT-P placed with AV nodal ablation in 3/18.  3. CAD: DES to OM in 2007.  LHC (7/17) with patent stent and no new obstructive disease.  4. Type II diabetes 5. Hyperlipidemia 6. TIA 7. Possible amiodarone lung toxicity: Chest CT (7/17) with bronchiectasis/fibrosis, patchy bilateral densities.   Social History   Social History  . Marital status: Married    Spouse name: N/A  . Number of children: N/A  . Years of education: N/A  Occupational History  . Not on file.   Social History Main Topics  . Smoking status: Former Smoker    Packs/day: 1.00    Types: Cigarettes    Quit date: 07/03/1963  . Smokeless tobacco: Never Used  . Alcohol use No  . Drug use: No  . Sexual activity: No   Other Topics Concern  . Not on file   Social History Narrative   Lives in [place] with husband.  Does not routinely exercise but is able to keep house and go food  shopping.   Family History  Problem Relation Age of Onset  . Heart attack Mother   . Stroke Mother   . Heart attack Father   . Heart attack Brother   . Coronary artery disease Sister        CABG  . Alzheimer's disease Sister        X3  . Stroke Sister   . Heart attack Sister    ROS: All systems reviewed and negative except as per HPI.   Current Outpatient Prescriptions  Medication Sig Dispense Refill  . ACCU-CHEK FASTCLIX LANCETS MISC USE TO CHECK BLOOD SUGAR TWICE DAILY  5  . ACCU-CHEK SMARTVIEW test strip     . acetaminophen (TYLENOL) 325 MG tablet Take 2 tablets (650 mg total) by mouth every 4 (four) hours as needed for headache or mild pain.    [name] apixaban (ELIQUIS) 2.5 MG TABS tablet Take 1 tablet (2.5 mg total) by mouth 2 (two) times daily. 60 tablet 3  . atorvastatin (LIPITOR) 20 MG tablet Take 1 tablet (20 mg total) by mouth daily. Please call and schedule a 3 month follow up appointment with Dr Acie Fredrickson per last office visit 90 tablet 2  . bisoprolol (ZEBETA) 5 MG tablet Take 1 tablet (5 mg total) by mouth daily. 30 tablet 6  . Cholecalciferol (VITAMIN D) 2000 units tablet Take 2,000 Units by mouth daily.    . digoxin (LANOXIN) 0.125 MG tablet Take 0.5 tablets (0.0625 mg total) by mouth every other day. 45 tablet 2  . furosemide (LASIX) 20 MG tablet as directed. Take 20 mg (1 tab) once daily alternating with 40 mg (2 tabs) the next day continuously.    [name] HYDROcodone-acetaminophen (NORCO/VICODIN) 5-325 MG tablet Take 1 tablet by mouth daily as needed for pain.    . metFORMIN (GLUCOPHAGE) 500 MG tablet Take 500 mg by mouth daily with supper.     . nitroGLYCERIN (NITROSTAT) 0.4 MG SL tablet Place 1 tablet (0.4 mg total) under the tongue every 5 (five) minutes x 3 doses as needed for chest pain. 25 tablet 3  . OVER THE COUNTER MEDICATION Take 1 capsule by mouth daily. Veggie cap with 250mg  vitamin C and 250mg  of cayenne    . spironolactone (ALDACTONE) 25 MG tablet Take 1 tablet (25  mg total) by mouth daily. 30 tablet 6   No current facility-administered medications for this encounter.    BP 116/68   Pulse 60   Ht 5\' 2"  (1.575 m)   Wt 123 lb 8 oz (56 kg)   SpO2 100%   BMI 22.59 kg/m  General: NAD Neck: JVP not elevated, no thyromegaly or thyroid nodule.  Lungs: CTAB CV: Nondisplaced PMI.  Heart irregular S1/S2, no S3/S4, 2/6 HSM LLSB/apex.  No edema.  No carotid bruit.  Normal pedal pulses.  Abdomen: Soft, nontender, no hepatosplenomegaly, mild distention.  Skin: Intact without lesions or rashes.  Neurologic: Alert and oriented x 3.  Psych:  Normal affect. Extremities: No clubbing or cyanosis.  HEENT: Normal.   Assessment/Plan: 1. Chronic systolic CHF: EF 10-25% on 7/17 echo, 30-35% on 1/18 echo.  Nonischemic cardiomyopathy, possible role for tachycardia-mediated cardiomyopathy (some improvement in EF with rate control but not back to normal). She had AV nodal ablation due to difficult-to-control atrial fibrillation in 3/18 with St Jude CRT-P placement. NYHA class II, looks euvolemic.  - Continue Lasix 40 daily alternating with 20 mg daily.   - Continue digoxin, check level today.   - Unable to tolerate losartan at even low dose.  - Continue bisoprolol 5 mg daily.  - Continue spironolactone 25 mg daily.  - I will refer her for cardiac rehab.  2. Atrial fibrillation/flutter:  Concern for tachy-mediated cardiomyopathy.  She had suspected amiodarone-induced lung toxicity so ranolazine was started.  She did not tolerate ranolazine due to nausea. Poor Tikosyn candidate with CKD.   She tolerated atrial fibrillation poorly and rate was difficult to control, so she had AV nodal ablation with St Jude CRT-P device.  She seems to be doing better since the ablation.  - Continue apixaban 2.5 mg bid.  Recent CBC stable. 3. CAD: Patent OM stent on coronary angiography in 7/17.  Continue statin. No ASA with warfarin use.  Good lipids in 1/18.  4. 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  Off amiodarone.  Follows with pulmonary.   Followup in 3 months  [name] 12/23/2016

## 2016-12-24 ENCOUNTER — Telehealth (HOSPITAL_COMMUNITY): Payer: Self-pay

## 2016-12-24 NOTE — Telephone Encounter (Signed)
Patient insurance is active and benefits verified. Patient has [employer] - $10.00 co-payment, no deductible, out of pocket $5900/$1004.83 has been met, 0% co-insurance, no pre-authorization and no limit on visit. Passport/reference [id number].

## 2016-12-24 NOTE — Telephone Encounter (Signed)
I called and left message on patient voicemail to call office about scheduling for cardiac rehab. I left office contact information on patient voicemail to return call.  ° °

## 2016-12-26 ENCOUNTER — Encounter (HOSPITAL_COMMUNITY): Payer: Self-pay

## 2016-12-26 NOTE — Progress Notes (Signed)
I have mailed patient a letter with information about cardiac rehab.

## 2016-12-31 ENCOUNTER — Ambulatory Visit (INDEPENDENT_AMBULATORY_CARE_PROVIDER_SITE_OTHER): Payer: Medicare HMO | Admitting: Internal Medicine

## 2016-12-31 ENCOUNTER — Encounter: Payer: Self-pay | Admitting: Internal Medicine

## 2016-12-31 VITALS — BP 108/70 | HR 67 | Ht 62.0 in | Wt 128.0 lb

## 2016-12-31 DIAGNOSIS — I482 Chronic atrial fibrillation: Secondary | ICD-10-CM | POA: Diagnosis not present

## 2016-12-31 DIAGNOSIS — I4821 Permanent atrial fibrillation: Secondary | ICD-10-CM

## 2016-12-31 DIAGNOSIS — I5022 Chronic systolic (congestive) heart failure: Secondary | ICD-10-CM

## 2016-12-31 DIAGNOSIS — I428 Other cardiomyopathies: Secondary | ICD-10-CM | POA: Diagnosis not present

## 2016-12-31 DIAGNOSIS — I442 Atrioventricular block, complete: Secondary | ICD-10-CM | POA: Diagnosis not present

## 2016-12-31 NOTE — Patient Instructions (Addendum)
Medication Instructions:    Your physician recommends that you continue on your current medications as directed. Please refer to the Current Medication list given to you today.  - If you need a refill on your cardiac medications before your next appointment, please call your pharmacy.   Labwork:  None ordered  Testing/Procedures: Your physician has requested that you have an echocardiogram (before office visit with Chanetta Marshall, NP). Echocardiography is a painless test that uses sound waves to create images of your heart. It provides your doctor with information about the size and shape of your heart and how well your heart's chambers and valves are working. This procedure takes approximately one hour. There are no restrictions for this procedure.  Follow-Up:  Your physician recommends that you schedule a follow-up appointment in: 3 months with Chanetta Marshall, NP  (after echocardiogram)  Thank you for choosing [facility]!!    Any Other Special Instructions Will Be Listed Below (If Applicable).

## 2016-12-31 NOTE — Progress Notes (Signed)
PCP: Haywood Pao, MD Primary Cardiologist:  Dr Acie Fredrickson CHF: [name] EP: Rayann Heman  Shannon [name] is a 83 y.o. female who presents today for routine electrophysiology followup.  Since her recent AV nodal ablation/ PPM, the patient reports doing very well.  She feels "great".  Today, she denies symptoms of palpitations, chest pain, shortness of breath,  lower extremity edema, dizziness, presyncope, or syncope.  She did have a mechanical fall last Wednessday.  She hit her head but did not seek medical attention.  She denies N/V, HA, visual changes, slurred speech above baseline, confusion, or difficulty with her thinking.  She declines head CT which was advised by me today.  The patient is otherwise without complaint today.   Past Medical History:  Diagnosis Date  . Arthritis   . Baker's cyst of knee   . CAD (coronary artery disease)    a. 05/2006 Cath/PCI: LAD 67m, D1 40 ost, LCX nl, OM1 95 (3.0x20 Taxus DES), RCA nl, EF 40% w/ lat AK;  04/2007 Ex MV: minimal lat ischemia in area of prior infarct->low risk-> med Rx.  . Chronic atrial fibrillation (HCC)    a. on Coumadin  . Chronic systolic (congestive) heart failure ([place])    a. 04/2015: EF 50-55% b. echo 12/2015: EF 20-25% w/ diffuse HK, biatrial enlargement, mild AI and MR, severe TR    . Diabetes mellitus   . Hyperlipidemia   . MVP (mitral valve prolapse)    a. 10/2003 Echo: nl LV fxn, mild MR/TR/AS  . Myocardial infarction ([place])   . Sleep apnea   . Transient ischemic attack    Past Surgical History:  Procedure Laterality Date  . ATRIAL FLUTTER ABLATION N/A 10/09/2012   Procedure: ATRIAL FLUTTER ABLATION;  Surgeon: Thompson Grayer, MD;  Location: [facility] CATH LAB;  Service: Cardiovascular;  Laterality: N/A;  . AV NODE ABLATION N/A 09/27/2016   Procedure: AV Node Ablation;  Surgeon: Thompson Grayer, MD;  Location: [place] CV LAB;  Service: Cardiovascular;  Laterality: N/A;  . BIV PACEMAKER INSERTION CRT-P N/A 09/27/2016   Procedure: BiV  Pacemaker Insertion CRT-P;  Surgeon: Thompson Grayer, MD;  Location: [place] CV LAB;  Service: Cardiovascular;  Laterality: N/A;  . CARDIAC CATHETERIZATION N/A 01/06/2016   Procedure: Right/Left Heart Cath and Coronary Angiography;  Surgeon: Belva Crome, MD;  Location: [place] CV LAB;  Service: Cardiovascular;  Laterality: N/A;  . CARDIOVERSION N/A 01/11/2016   Procedure: CARDIOVERSION;  Surgeon: Larey Dresser, MD;  Location: [facility] ENDOSCOPY;  Service: Cardiovascular;  Laterality: N/A;  . cataracts    . COLONOSCOPY N/A 10/13/2015   Procedure: COLONOSCOPY;  Surgeon: Manus Gunning, MD;  Location: [facility] ENDOSCOPY;  Service: Gastroenterology;  Laterality: N/A;  . CORONARY ANGIOPLASTY     Status post PTCA and stenting of the first obtuse marginal-05/13/2006. We placed a 3.0 x 20 mm Taxus stent. It was post dilated using a 3.25 mm noncompliant balloon up to 14 atmospheres  . IMPLANT POCKET INCISION & DRAINAGE N/A 11/02/2016   Procedure: Implant Pocket Incision & Drainage;  Surgeon: Evans Lance, MD;  Location: [place] CV LAB;  Service: Cardiovascular;  Laterality: N/A;  . TONSILLECTOMY      ROS- all systems are reviewed and negative except as per HPI above  Current Outpatient Prescriptions  Medication Sig Dispense Refill  . ACCU-CHEK FASTCLIX LANCETS MISC USE TO CHECK BLOOD SUGAR TWICE DAILY  5  . ACCU-CHEK SMARTVIEW test strip     . acetaminophen (TYLENOL) 325  MG tablet Take 2 tablets (650 mg total) by mouth every 4 (four) hours as needed for headache or mild pain.    [name] apixaban (ELIQUIS) 2.5 MG TABS tablet Take 1 tablet (2.5 mg total) by mouth 2 (two) times daily. 60 tablet 3  . atorvastatin (LIPITOR) 20 MG tablet Take 1 tablet (20 mg total) by mouth daily. Please call and schedule a 3 month follow up appointment with Dr Acie Fredrickson per last office visit 90 tablet 2  . bisoprolol (ZEBETA) 5 MG tablet Take 1 tablet (5 mg total) by mouth daily. 30 tablet 6  . Cholecalciferol (VITAMIN D)  2000 units tablet Take 2,000 Units by mouth daily.    . digoxin (LANOXIN) 0.125 MG tablet Take 0.5 tablets (0.0625 mg total) by mouth every other day. 45 tablet 2  . furosemide (LASIX) 20 MG tablet as directed. Take 20 mg (1 tab) once daily alternating with 40 mg (2 tabs) the next day continuously.    [name] HYDROcodone-acetaminophen (NORCO/VICODIN) 5-325 MG tablet Take 1 tablet by mouth daily as needed for pain.    . metFORMIN (GLUCOPHAGE) 500 MG tablet Take 500 mg by mouth daily with supper.     . nitroGLYCERIN (NITROSTAT) 0.4 MG SL tablet Place 1 tablet (0.4 mg total) under the tongue every 5 (five) minutes x 3 doses as needed for chest pain. 25 tablet 3  . spironolactone (ALDACTONE) 25 MG tablet Take 1 tablet (25 mg total) by mouth daily. 30 tablet 6  . vitamin C (ASCORBIC ACID) 500 MG tablet Take 500 mg by mouth daily.     No current facility-administered medications for this visit.     Physical Exam: Vitals:   12/31/16 1000  BP: 108/70  Pulse: 67  SpO2: 98%  Weight: 128 lb (58.1 kg)  Height: 5\' 2"  (1.575 m)    GEN- The patient is well appearing, alert and oriented x 3 today.   Head- normocephalic, R forehead with small hematoma s/p fall Eyes-  Sclera clear, conjunctiva pink Ears- hearing intact Oropharynx- clear Lungs- Clear to ausculation bilaterally, normal work of breathing Chest- pacemaker pocket is well healed Heart- Regular rate and rhythm (paced) GI- soft, NT, ND, + BS Extremities- no clubbing, cyanosis, or edema Neuro- CN II-XII intact, strength/sensation intact, no confusion, appropriate thinking today  ekg today reveals afib with his bundle pacing  Pacemaker interrogation- reviewed in detail today,  See PACEART report  Assessment and Plan:  1. AV block Normal pacemaker function Nice narrow QRS with V pacing.  His lead in LV lead,  RV backup during refractory period.  Today, her LV threshold is 1.75V @1msec .  She has narrow QRS above 2V @1  msec. See Claudia Desanctis Art  report For battery longevity, LV (His) is programmed at 3V @1msec .  RV backup is 2V @ 0.4 msec.  Could consider turning RV pacing off in the future if LV lead stability is assured.  2. Permanent afib Rate controlled s/p AV nodal ablation  chads2vasc score is 8.  Continue eliquis long term.  3. Nonischemic CM Hopefully EF will recover with rate control Repeat echo in 3 months  4. S/p mechanical fall She has a small hematoma over her forehead.  I have advised head CT given anticoagulation which she declines.  She is aware that if she develops symptoms that she should have this evaluated immediately.  Merlin Return to see EP NP in 3 months with an echo at that time.  Thompson Grayer MD, [facility] 12/31/2016 10:34 AM

## 2017-01-04 ENCOUNTER — Telehealth (HOSPITAL_COMMUNITY): Payer: Self-pay

## 2017-01-04 NOTE — Telephone Encounter (Signed)
I called and left message on patient voicemail to call office about scheduling for cardiac rehab. I left office contact information on patient voicemail to return call.  ° °

## 2017-01-07 ENCOUNTER — Other Ambulatory Visit: Payer: Self-pay | Admitting: Internal Medicine

## 2017-01-18 IMAGING — DX DG CHEST 2V
2 series · 2 of 2 positions shown · non-contrast
Comparison: 11/16/2015

CLINICAL DATA: Dyspnea, cough and weakness for 3 months.

EXAM:
CHEST  2 VIEW

[chest lat]
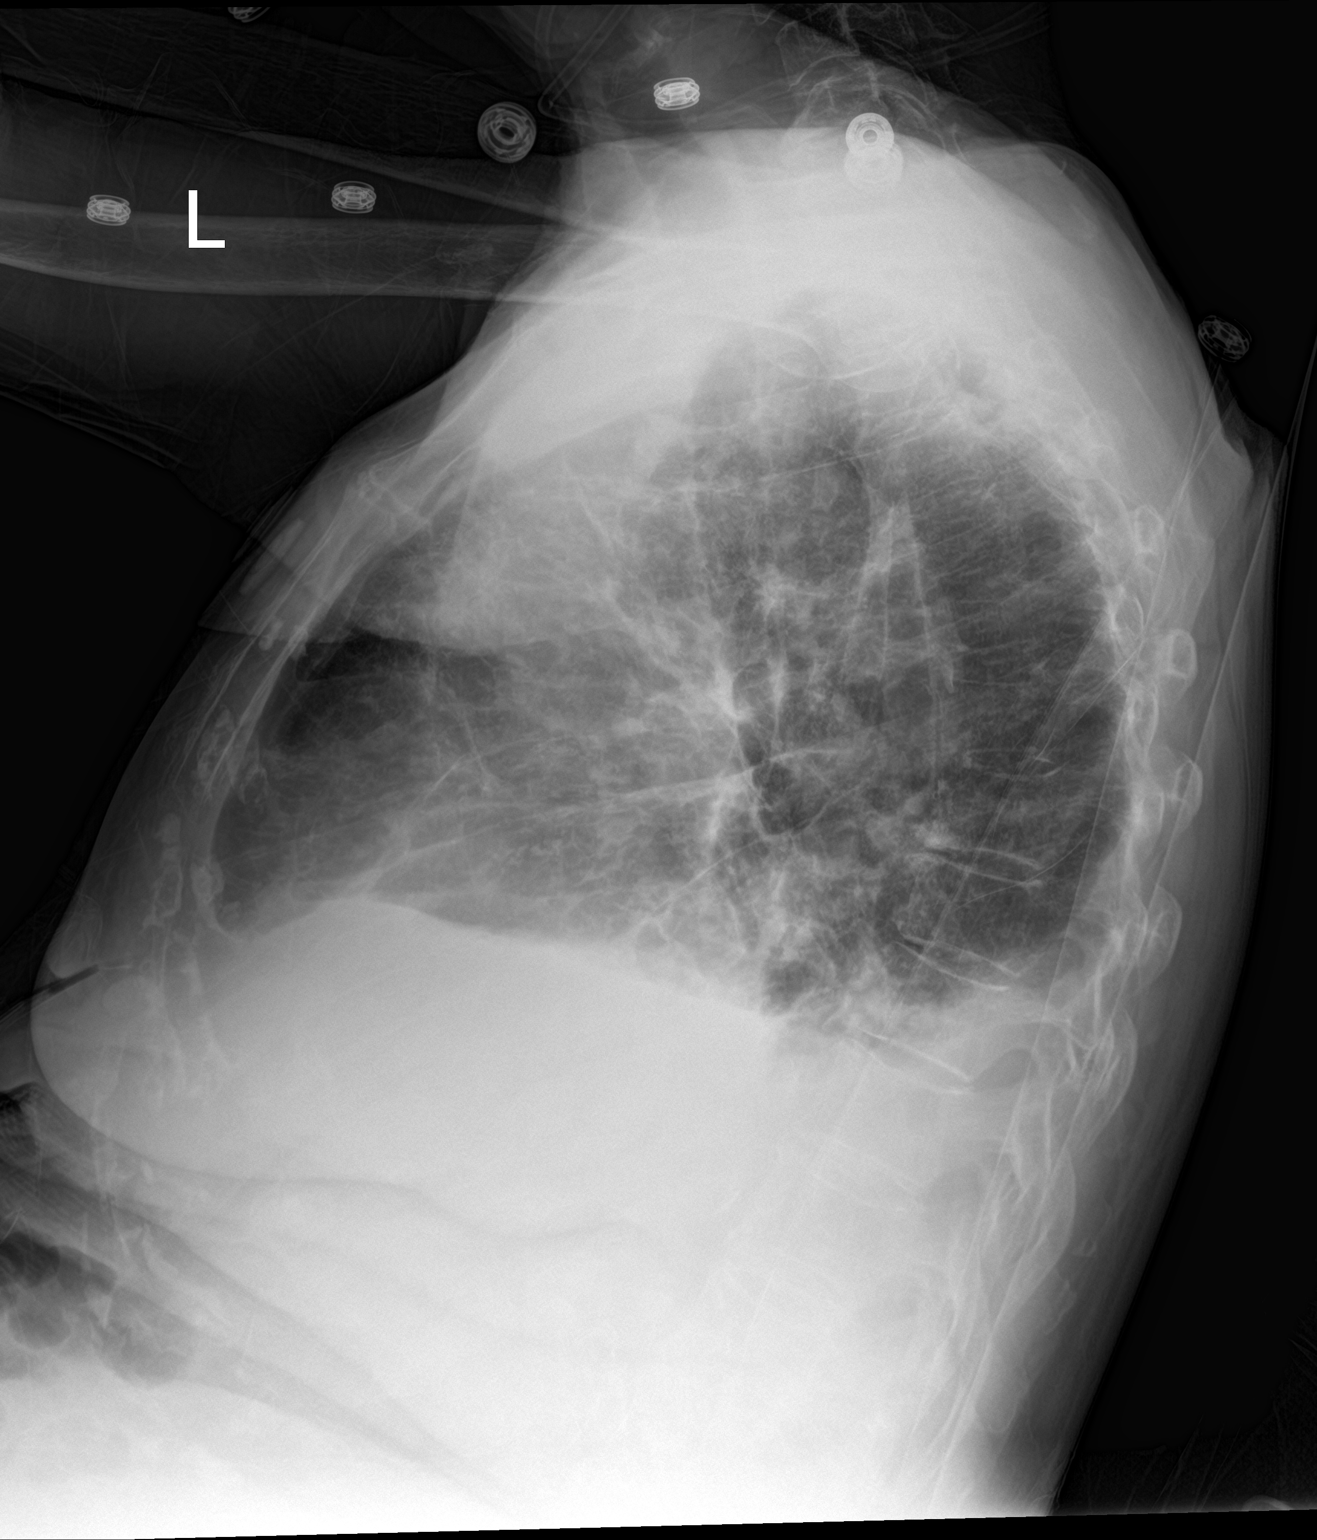

[chest ap]
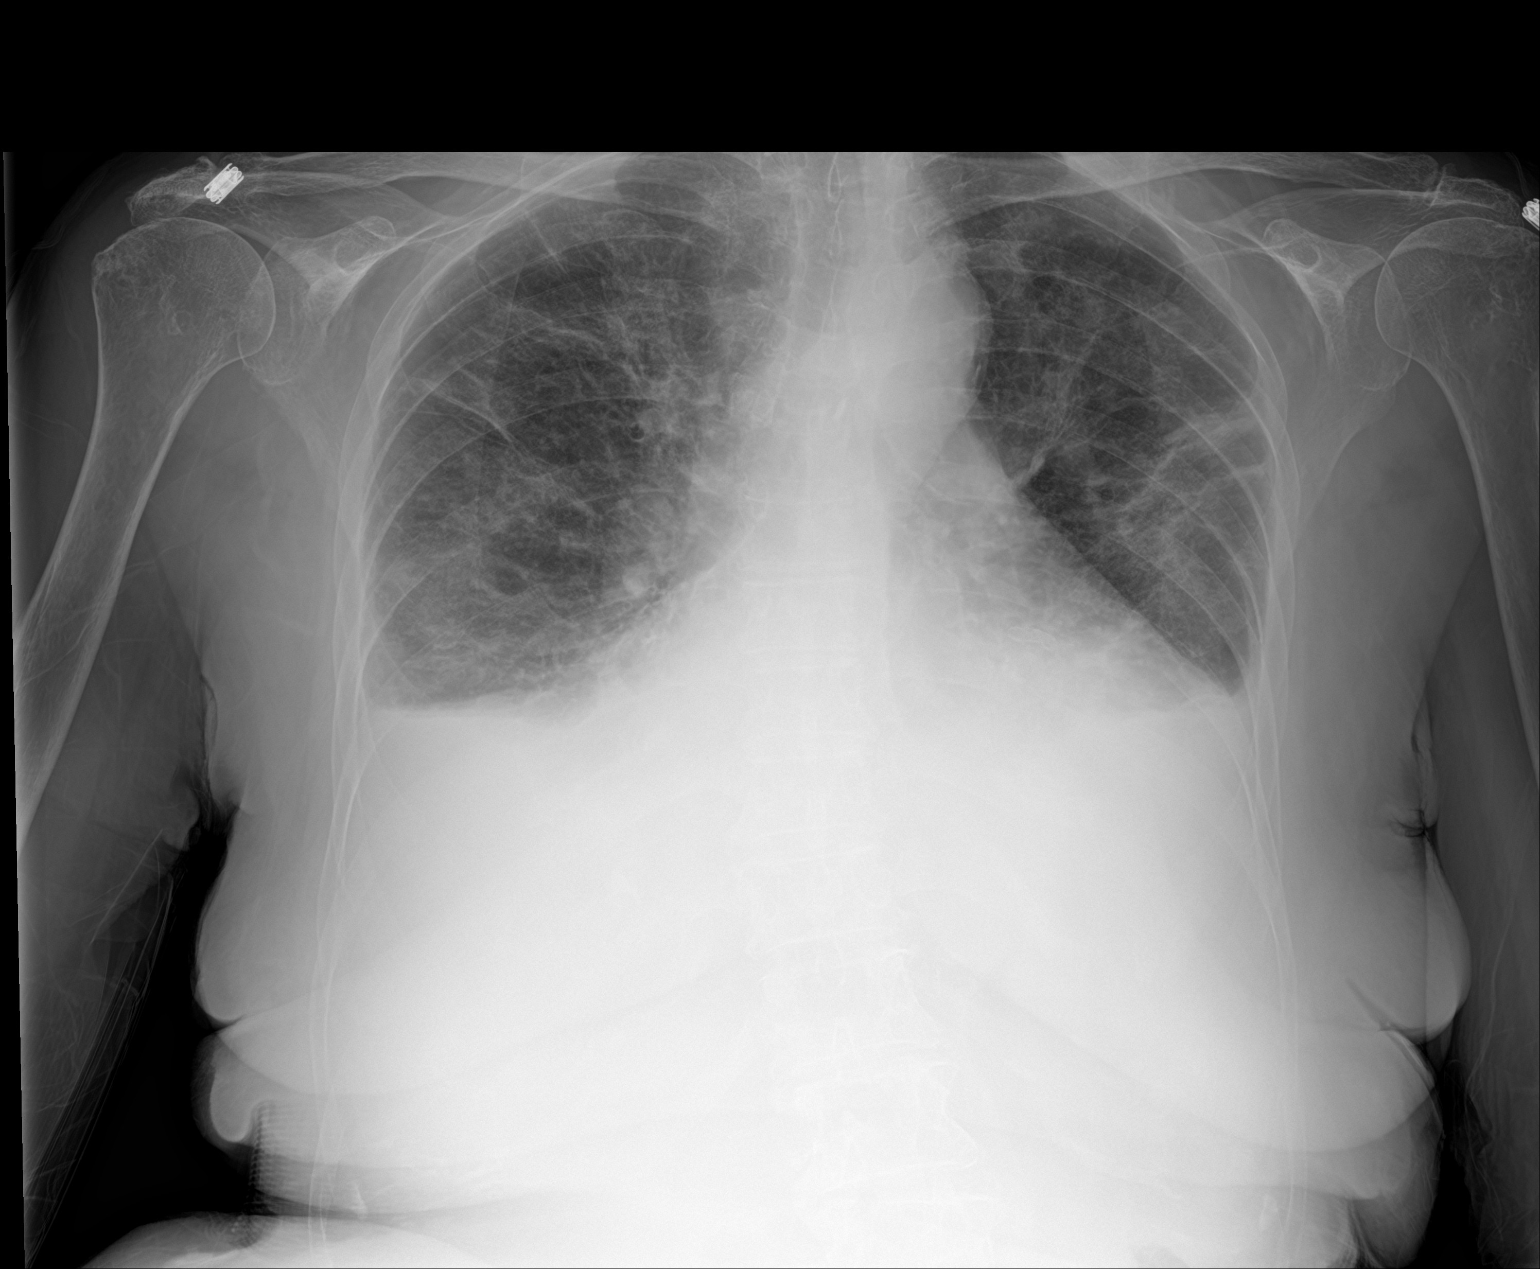

[2 of 2 positions shown; findings below may reference images not displayed]

FINDINGS: There is unchanged moderate cardiomegaly and aortic tortuosity.
There is focal airspace consolidation in the left base, as well as
curvilinear atelectatic appearing opacities in the lateral lungs.
Probable small effusions bilaterally.
IMPRESSION: Airspace opacities and effusions bilaterally. This may represent
pneumonia.

## 2017-03-13 ENCOUNTER — Other Ambulatory Visit (HOSPITAL_COMMUNITY): Payer: Self-pay | Admitting: Cardiology

## 2017-03-13 MED ORDER — BISOPROLOL FUMARATE 5 MG PO TABS: 5.0000 mg | ORAL_TABLET | Freq: Every day | ORAL | 3 refills | Status: DC

## 2017-03-18 ENCOUNTER — Ambulatory Visit (HOSPITAL_COMMUNITY): Payer: Medicare HMO | Attending: Cardiology

## 2017-03-18 ENCOUNTER — Other Ambulatory Visit: Payer: Self-pay

## 2017-03-18 DIAGNOSIS — I482 Chronic atrial fibrillation: Secondary | ICD-10-CM | POA: Diagnosis not present

## 2017-03-18 DIAGNOSIS — G4730 Sleep apnea, unspecified: Secondary | ICD-10-CM | POA: Insufficient documentation

## 2017-03-18 DIAGNOSIS — E785 Hyperlipidemia, unspecified: Secondary | ICD-10-CM | POA: Diagnosis not present

## 2017-03-18 DIAGNOSIS — E119 Type 2 diabetes mellitus without complications: Secondary | ICD-10-CM | POA: Insufficient documentation

## 2017-03-18 DIAGNOSIS — I252 Old myocardial infarction: Secondary | ICD-10-CM | POA: Insufficient documentation

## 2017-03-18 DIAGNOSIS — I4821 Permanent atrial fibrillation: Secondary | ICD-10-CM

## 2017-03-18 DIAGNOSIS — I2510 Atherosclerotic heart disease of native coronary artery without angina pectoris: Secondary | ICD-10-CM | POA: Diagnosis not present

## 2017-03-18 DIAGNOSIS — I081 Rheumatic disorders of both mitral and tricuspid valves: Secondary | ICD-10-CM | POA: Insufficient documentation

## 2017-03-24 ENCOUNTER — Other Ambulatory Visit: Payer: Self-pay | Admitting: Cardiovascular Disease

## 2017-03-27 ENCOUNTER — Other Ambulatory Visit (HOSPITAL_COMMUNITY): Payer: Self-pay | Admitting: Cardiology

## 2017-03-27 MED ORDER — SPIRONOLACTONE 25 MG PO TABS: 25.0000 mg | ORAL_TABLET | Freq: Every day | ORAL | 6 refills | Status: DC

## 2017-03-28 ENCOUNTER — Other Ambulatory Visit (HOSPITAL_COMMUNITY): Payer: Self-pay | Admitting: Cardiology

## 2017-03-28 MED ORDER — SPIRONOLACTONE 25 MG PO TABS: 25.0000 mg | ORAL_TABLET | Freq: Every day | ORAL | 3 refills | Status: DC

## 2017-04-01 ENCOUNTER — Ambulatory Visit (INDEPENDENT_AMBULATORY_CARE_PROVIDER_SITE_OTHER): Payer: Medicare HMO | Admitting: Cardiovascular Disease

## 2017-04-01 ENCOUNTER — Encounter: Payer: Self-pay | Admitting: Cardiovascular Disease

## 2017-04-01 VITALS — BP 108/62 | HR 62 | Ht 62.0 in | Wt 127.4 lb

## 2017-04-01 DIAGNOSIS — I2510 Atherosclerotic heart disease of native coronary artery without angina pectoris: Secondary | ICD-10-CM | POA: Diagnosis not present

## 2017-04-01 DIAGNOSIS — I5022 Chronic systolic (congestive) heart failure: Secondary | ICD-10-CM

## 2017-04-01 NOTE — Progress Notes (Signed)
Cardiology Office Note   Date:  04/01/2017   ID:  Shannon [name], DOB 06/01/1932, MRN [id number]  PCP:  Haywood Pao, MD  Cardiologist:   Mertie Moores, MD   Chief Complaint  Patient presents with  . Follow-up    cad   1. Coronary artery disease: Status post PTCA and stenting of the first obtuse marginal-05/13/2006. We placed a 3.0 x 20 mm Taxus stent. It was post dilated using a 3.25 mm noncompliant balloon up to 14 atmospheres 2. Diabetes mellitus 3. Hyperlipidemia 4. Paroxysmal Atrial fib  History of Present Illness: Shannon [name] is an 83 y.o. female with a hx of CAD. She has some occasional episodes of chest pressure typically when she wakes up in the morning. She also wakes up with a severe headache. She's been worked up for sleep apnea. She works out in the yard on a fairly regular basis and does not have any chest pain or chest tightness with exercise.   She is active all day long. She gets tired but never has any chest pain.  Feb. 12, 2015:  Shannon [name] is doing well. She was in the hosptial recently with Atrial fib and atrial flutter. She had an EP study but was not able to be ablated. She was cardioverted and has maintained NSR. She is able to do all of her normal activities. She is tolerating her pradaxa . Sometimes she forgets  February 15, 2014:  Shannon [name] is doing well. No CP. She had had some bloody mucus and had some sinus problems. Likely due to the pradaxa. No significant arrhthymias    September 20, 2014: Shannon [name] is a 83 y.o. female who presents for follow up of her CAD She is upset about her husband, [name]. He fell last August and has been bedridden since that time.  Has not been able to get him to his pacer appts.   No CP or dyspnea.  She has occasional nose bleeds  - likely due to the Pradaxa   Oct. 4, 2016: Shannon [name] is seen today for evaluation of rapid atrial fib.  She has paroxysmal atrial fib - has been in atrial fib off  and of  for the past 12 days   Frequently takes extra metoprolol to help keep her  HR slower  Has had excessive fatigue, leg swelling   Oct. 11, 2016 Shannon [name] is back for follow up Was started on amiodarone. She's had some nausea and lack of appetite since starting the amiodarone. She has converted to sinus rhythm. She is feeling better now that she is in normal sinus rhythm. She's had a history of paroxysmal atrial fibrillation. She has seen Dr. Rayann Heman in the past. They discussed A. fib ablation but Dr. Rayann Heman decided that she was not a good candidate for ablation.  Jan. 20, 2017:  Doing ok.  No dizziness, no dyspnea, no CP   Dec 27, 2015:  Complains of being very weak Has had GI bleeding on pradaxa.  Was hospitalized Now is on coumadin  Going to our coumadin clinic  Has developed a cough.   Looks pale today .   Is on Vit D and iron tablets Has not had any recent bleeding .   Has a poor appetite.     Was so weak that she could not weight herself.   Apr 01, 2017   Shannon [name] is seen back after 1 1/2 year. No angina Seems to be doing well  Had a pacer ( St. Jude CRT)  placed in March , 2018 LV function has improved significantly after the CRT. EF 40-45%.  Now is seeing [name] in the CHF clinic, [name] in EP.   Past Medical History:  Diagnosis Date  . Arthritis   . Baker's cyst of knee   . CAD (coronary artery disease)    a. 05/2006 Cath/PCI: LAD 39m, D1 40 ost, LCX nl, OM1 95 (3.0x20 Taxus DES), RCA nl, EF 40% w/ lat AK;  04/2007 Ex MV: minimal lat ischemia in area of prior infarct->low risk-> med Rx.  . Chronic atrial fibrillation (HCC)    a. on Coumadin  . Chronic systolic (congestive) heart failure ([place])    a. 04/2015: EF 50-55% b. echo 12/2015: EF 20-25% w/ diffuse HK, biatrial enlargement, mild AI and MR, severe TR    . Diabetes mellitus   . Hyperlipidemia   . MVP (mitral valve prolapse)    a. 10/2003 Echo: nl LV fxn, mild MR/TR/AS  . Myocardial infarction ([place])    . Sleep apnea   . Transient ischemic attack     Past Surgical History:  Procedure Laterality Date  . ATRIAL FLUTTER ABLATION N/A 10/09/2012   Procedure: ATRIAL FLUTTER ABLATION;  Surgeon: Thompson Grayer, MD;  Location: [facility] CATH LAB;  Service: Cardiovascular;  Laterality: N/A;  . AV NODE ABLATION N/A 09/27/2016   Procedure: AV Node Ablation;  Surgeon: Thompson Grayer, MD;  Location: [place] CV LAB;  Service: Cardiovascular;  Laterality: N/A;  . BIV PACEMAKER INSERTION CRT-P N/A 09/27/2016   Procedure: BiV Pacemaker Insertion CRT-P;  Surgeon: Thompson Grayer, MD;  Location: [place] CV LAB;  Service: Cardiovascular;  Laterality: N/A;  . CARDIAC CATHETERIZATION N/A 01/06/2016   Procedure: Right/Left Heart Cath and Coronary Angiography;  Surgeon: Belva Crome, MD;  Location: [place] CV LAB;  Service: Cardiovascular;  Laterality: N/A;  . CARDIOVERSION N/A 01/11/2016   Procedure: CARDIOVERSION;  Surgeon: Larey Dresser, MD;  Location[facility] ENDOSCOPY;  Service: Cardiovascular;  Laterality: N/A;  . cataracts    . COLONOSCOPY N/A 10/13/2015   Procedure: COLONOSCOPY;  Surgeon: Manus Gunning, MD;  Location: [facility] ENDOSCOPY;  Service: Gastroenterology;  Laterality: N/A;  . CORONARY ANGIOPLASTY     Status post PTCA and stenting of the first obtuse marginal-05/13/2006. We placed a 3.0 x 20 mm Taxus stent. It was post dilated using a 3.25 mm noncompliant balloon up to 14 atmospheres  . IMPLANT POCKET INCISION & DRAINAGE N/A 11/02/2016   Procedure: Implant Pocket Incision & Drainage;  Surgeon: Evans Lance, MD;  Location: [place] CV LAB;  Service: Cardiovascular;  Laterality: N/A;  . TONSILLECTOMY       Current Outpatient Prescriptions  Medication Sig Dispense Refill  . ACCU-CHEK FASTCLIX LANCETS MISC USE TO CHECK BLOOD SUGAR TWICE DAILY  5  . ACCU-CHEK SMARTVIEW test strip     . acetaminophen (TYLENOL) 325 MG tablet Take 2 tablets (650 mg total) by mouth every 4 (four) hours as needed for  headache or mild pain.    [name] apixaban (ELIQUIS) 2.5 MG TABS tablet Take 1 tablet (2.5 mg total) by mouth 2 (two) times daily. 60 tablet 3  . atorvastatin (LIPITOR) 20 MG tablet TAKE 1 TABLET BY MOUTH EVERY DAY 90 tablet 2  . bisoprolol (ZEBETA) 5 MG tablet Take 1 tablet (5 mg total) by mouth daily. 90 tablet 3  . Cholecalciferol (VITAMIN D) 2000 units tablet Take 2,000 Units by mouth daily.    . digoxin (LANOXIN) 0.125 MG tablet Take 0.5  tablets (0.0625 mg total) by mouth every other day. 45 tablet 2  . furosemide (LASIX) 20 MG tablet as directed. Take 20 mg (1 tab) once daily alternating with 40 mg (2 tabs) the next day continuously.    [name] HYDROcodone-acetaminophen (NORCO/VICODIN) 5-325 MG tablet Take 1 tablet by mouth daily as needed for pain.    . metFORMIN (GLUCOPHAGE) 500 MG tablet Take 500 mg by mouth daily with supper.     . nitroGLYCERIN (NITROSTAT) 0.4 MG SL tablet Place 1 tablet (0.4 mg total) under the tongue every 5 (five) minutes x 3 doses as needed for chest pain. 25 tablet 3  . spironolactone (ALDACTONE) 25 MG tablet Take 1 tablet (25 mg total) by mouth daily. 90 tablet 3  . vitamin C (ASCORBIC ACID) 500 MG tablet Take 500 mg by mouth daily.     No current facility-administered medications for this visit.     Allergies:   Amiodarone; Ranexa [ranolazine]; Other; Demerol; Erythromycin; Penicillins; and Percocet [oxycodone-acetaminophen]    Social History:  The patient  reports that she quit smoking about 53 years ago. Her smoking use included Cigarettes. She smoked 1.00 pack per day. She has never used smokeless tobacco. She reports that she does not drink alcohol or use drugs.   Family History:  The patient's family history includes Alzheimer's disease in her sister; Coronary artery disease in her sister; Heart attack in her brother, father, mother, and sister; Stroke in her mother and sister.    ROS:  Please see the history of present illness.    Review of  Systems: Constitutional:  denies fever, chills, diaphoresis, appetite change and fatigue.  HEENT: denies photophobia, eye pain, redness, hearing loss, ear pain, congestion, sore throat, rhinorrhea, sneezing, neck pain, neck stiffness and tinnitus.  Respiratory: denies SOB, DOE, cough, chest tightness, and wheezing.  Cardiovascular: denies chest pain, palpitations and leg swelling.  Gastrointestinal: denies nausea, vomiting, abdominal pain, diarrhea, constipation, blood in stool.  Genitourinary: denies dysuria, urgency, frequency, hematuria, flank pain and difficulty urinating.  Musculoskeletal: denies  myalgias, back pain, joint swelling, arthralgias and gait problem.   Skin: denies pallor, rash and wound.  Neurological: denies dizziness, seizures, syncope, weakness, light-headedness, numbness and headaches.   Hematological: denies adenopathy, easy bruising, personal or family bleeding history.  Psychiatric/ Behavioral: denies suicidal ideation, mood changes, confusion, nervousness, sleep disturbance and agitation.       All other systems are reviewed and negative.    Physical Exam: Blood pressure 108/62, pulse 62, height 5\' 2"  (1.575 m), weight 127 lb 6.4 oz (57.8 kg), SpO2 99 %.  GEN:  Well nourished, well developed in no acute distress HEENT: Normal NECK: No JVD; No carotid bruits LYMPHATICS: No lymphadenopathy CARDIAC: RR , soft systolic murmur,no  rubs, gallops,  pacer pocket is well healed  RESPIRATORY:  Clear to auscultation without rales, wheezing or rhonchi  ABDOMEN: Soft, non-tender, non-distended MUSCULOSKELETAL:  No edema; No deformity  SKIN: Warm and dry NEUROLOGIC:  Alert and oriented x 3    EKG:  EKG is not ordered today.   Recent Labs: 07/18/2016: ALT 14 09/04/2016: B Natriuretic Peptide 519.3; Magnesium 2.1 10/29/2016: Hemoglobin 10.9; Platelets 174 11/07/2016: BUN 43; Creatinine, Ser 1.34; Potassium 4.1; Sodium 141    Lipid Panel    Component Value Date/Time    CHOL 119 07/24/2016 1526   TRIG 70 07/24/2016 1526   HDL 63 07/24/2016 1526   CHOLHDL 1.9 07/24/2016 1526   VLDL 14 07/24/2016 1526   LDLCALC 42 07/24/2016  1526      Wt Readings from Last 3 Encounters:  04/01/17 127 lb 6.4 oz (57.8 kg)  12/31/16 128 lb (58.1 kg)  12/21/16 123 lb 8 oz (56 kg)      Other studies Reviewed: Additional studies/ records that were reviewed today include: . Review of the above records demonstrates:    ASSESSMENT AND PLAN:  1. Coronary artery disease: Status post PTCA and stenting of the first obtuse marginal-05/13/2006. We placed a 3.0 x 20 mm Taxus stent. It was post dilated using a 3.25 mm noncompliant balloon up to 14 atmospheres  She's not having any episodes of angina. Continue current medications.  2. Chronic systolic congestive heart failure:  Her ejection fraction has improved after her CRT. Continue current medications.  3. Hyperlipidemia- she is followed by Dr. Osborne Casco.   Continue atorvastatin.  4. Paroxysmal Atrial fib- . S/p AV node ablation,     The following changes have been made:  no change  Labs/ tests ordered today include:  No orders of the defined types were placed in this encounter.   Disposition:   FU with me in 1 year    Signed, Mertie Moores, MD  04/01/2017 4:46 PM    [address], [place], [place]  [address] Phone: [phone]; Fax: [phone]

## 2017-04-01 NOTE — Patient Instructions (Signed)

## 2017-04-09 ENCOUNTER — Other Ambulatory Visit (HOSPITAL_COMMUNITY): Payer: Self-pay | Admitting: *Deleted

## 2017-04-09 MED ORDER — APIXABAN 2.5 MG PO TABS: 2.5000 mg | ORAL_TABLET | Freq: Two times a day (BID) | ORAL | 3 refills | Status: DC

## 2017-04-23 ENCOUNTER — Encounter (HOSPITAL_COMMUNITY): Payer: Self-pay | Admitting: Cardiology

## 2017-04-23 ENCOUNTER — Ambulatory Visit (HOSPITAL_COMMUNITY)
Admission: RE | Admit: 2017-04-23 | Discharge: 2017-04-23 | Disposition: A | Discharge status: Home / Self Care | Payer: Medicare HMO | Source: Ambulatory Visit | Attending: Cardiology | Admitting: Cardiology

## 2017-04-23 VITALS — BP 130/76 | HR 76 | Wt 127.8 lb

## 2017-04-23 DIAGNOSIS — Z820 Family history of epilepsy and other diseases of the nervous system: Secondary | ICD-10-CM | POA: Diagnosis not present

## 2017-04-23 DIAGNOSIS — I4892 Unspecified atrial flutter: Secondary | ICD-10-CM | POA: Diagnosis not present

## 2017-04-23 DIAGNOSIS — Z9889 Other specified postprocedural states: Secondary | ICD-10-CM | POA: Insufficient documentation

## 2017-04-23 DIAGNOSIS — E785 Hyperlipidemia, unspecified: Secondary | ICD-10-CM | POA: Insufficient documentation

## 2017-04-23 DIAGNOSIS — Z823 Family history of stroke: Secondary | ICD-10-CM | POA: Diagnosis not present

## 2017-04-23 DIAGNOSIS — I429 Cardiomyopathy, unspecified: Secondary | ICD-10-CM | POA: Insufficient documentation

## 2017-04-23 DIAGNOSIS — Z7984 Long term (current) use of oral hypoglycemic drugs: Secondary | ICD-10-CM | POA: Insufficient documentation

## 2017-04-23 DIAGNOSIS — I5022 Chronic systolic (congestive) heart failure: Secondary | ICD-10-CM

## 2017-04-23 DIAGNOSIS — Z8249 Family history of ischemic heart disease and other diseases of the circulatory system: Secondary | ICD-10-CM | POA: Insufficient documentation

## 2017-04-23 DIAGNOSIS — I2510 Atherosclerotic heart disease of native coronary artery without angina pectoris: Secondary | ICD-10-CM | POA: Diagnosis not present

## 2017-04-23 DIAGNOSIS — E119 Type 2 diabetes mellitus without complications: Secondary | ICD-10-CM | POA: Insufficient documentation

## 2017-04-23 DIAGNOSIS — Z8673 Personal history of transient ischemic attack (TIA), and cerebral infarction without residual deficits: Secondary | ICD-10-CM | POA: Diagnosis not present

## 2017-04-23 DIAGNOSIS — I480 Paroxysmal atrial fibrillation: Secondary | ICD-10-CM | POA: Insufficient documentation

## 2017-04-23 DIAGNOSIS — Z95810 Presence of automatic (implantable) cardiac defibrillator: Secondary | ICD-10-CM | POA: Insufficient documentation

## 2017-04-23 DIAGNOSIS — I4821 Permanent atrial fibrillation: Secondary | ICD-10-CM

## 2017-04-23 DIAGNOSIS — Z87891 Personal history of nicotine dependence: Secondary | ICD-10-CM | POA: Diagnosis not present

## 2017-04-23 DIAGNOSIS — I482 Chronic atrial fibrillation: Secondary | ICD-10-CM | POA: Diagnosis not present

## 2017-04-23 LAB — BASIC METABOLIC PANEL
Anion gap: 9 (ref 5–15)
BUN: 43 mg/dL — AB (ref 6–20)
CHLORIDE: 105 mmol/L (ref 101–111)
CO2: 23 mmol/L (ref 22–32)
CREATININE: 1.67 mg/dL — AB (ref 0.44–1.00)
Calcium: 9.6 mg/dL (ref 8.9–10.3)
GFR calc Af Amer: 32 mL/min — ABNORMAL LOW (ref >60)
GFR calc non Af Amer: 27 mL/min — ABNORMAL LOW (ref >60)
GLUCOSE: 124 mg/dL — AB (ref 65–99)
Potassium: 3.9 mmol/L (ref 3.5–5.1)
Sodium: 137 mmol/L (ref 135–145)

## 2017-04-23 LAB — CBC
HEMATOCRIT: 34.4 % — AB (ref 36.0–46.0)
HEMOGLOBIN: 11.6 g/dL — AB (ref 12.0–15.0)
MCH: 34.4 pg — AB (ref 26.0–34.0)
MCHC: 33.7 g/dL (ref 30.0–36.0)
MCV: 102.1 fL — ABNORMAL HIGH (ref 78.0–100.0)
Platelets: 157 10*3/uL (ref 150–400)
RBC: 3.37 MIL/uL — ABNORMAL LOW (ref 3.87–5.11)
RDW: 14.9 % (ref 11.5–15.5)
WBC: 7.7 10*3/uL (ref 4.0–10.5)

## 2017-04-23 LAB — DIGOXIN LEVEL: Digoxin Level: <0.2 ng/mL — ABNORMAL LOW (ref 0.8–2.0)

## 2017-04-23 MED ORDER — BISOPROLOL FUMARATE 5 MG PO TABS: 7.5000 mg | ORAL_TABLET | Freq: Every day | ORAL | 3 refills | Status: DC

## 2017-04-23 NOTE — Patient Instructions (Signed)
Increase Bisoprolol to 7.5 mg (1.5 tabs) daily  Labs drawn today (if we do not call you, then your lab work was stable)   Your physician recommends that you schedule a follow-up appointment in: 4 months (we will call you, or call us January, 2019)

## 2017-04-24 NOTE — Progress Notes (Signed)
Patient ID: Shannon [name], female   DOB: [birth year], 83 y.o.   MRN: [id number] PCP: Dr. Osborne Casco Cardiology: Dr. [name]  83 yo with paroxysmal atrial fibrillation, chronic systolic CHF, and CAD presents for followup of CHF. She apparently had been in persistent atrial fibrillation since around 8/16.  It appears that she was tachycardic most of the time prior to her recent admission in 7/17.  She had been on amiodarone prior to admission but it was not keeping her in NSR.  She was admitted with acute systolic CHF.  EF had dropped to 20-25% in 7/17 from 50-55% in 10/16.  She was in atrial fibrillation with RVR.  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  Amiodarone was stopped due to suspicion for amiodarone-induced lung toxicity (she was seen by pulmonary).  She was eventually cardioverted to NSR.  Ranolazine was started to try to keep her in NSR.  HR was low in the hospital so she has not been on a beta blocker.  She was extensively diuresed and discharged to SNF.  After discharge, she developed persistent nausea.  Eventually, it was found that the nausea was likely from ranolazine, so this medication was stopped and the nausea resolved.    Patient continued to have difficult-to-control atrial fibrillation.  GFR was too low for Tikosyn.  She therefore had AV nodal ablation with St Jude CRT-P device placed in 3/18.  She developed a pocket hematoma and had pocket revision with sub-pectoral placement in 5/18. Repeat echo in 9/18 showed EF up to 40-45%.   She is doing well today.  She is cooking, washing dishes, vacuuming - all without significant dyspnea.  No dyspnea walking on flat ground.  Mild fatigue with a long walk.  No chest pain.  No lightheadedness.  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   Labs (7/17): Digoxin 1.6 => 0.3, K 4.6, creatinine 0.87 => 0.76, HCT 34, BNP 265 Labs (9/17): K 5, creatinine 1.36 Labs (11/17): K 4.9,  creatinine 1.29, hgb 9.2, digoxin 0.5 Labs (12/17): hgb 10.4, digoxin 0.4 Labs (1/18): K 4, creatinine 1.62 => 1.49, hgb 9.5, LDL 42, HDL 63 Labs (3/18): K 4.1, creatinine 1.44, digoxin 0.4 Labs (6/18): K 3.9, creatinine 1.7, hgb 11.5, LDL 56, HDL 50, digoxin 0.8  PMH: 1. Atrial fibrillation: Paroxysmal.  Had been persistent since 8/16, was cardioverted to NSR during 7/17 admission.  Possible atypical atrial flutter on 12/17 ECG.  By 3/18, atrial fibrillation again.  - Nausea with ranolazine used for atrial fibrillation suppression.  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  - AV nodal ablation 3/18 with CRT-P.  2. 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  She had been in atrial fibrillation with elevated rate possibly for months prior to 7/17 admission.  - Echo (10/16): EF 50-55%.  - Echo (7/17): EF 20-25%, mildly decreased RV systolic function, severe TR, PA systolic pressure 64 mmHg.  - LHC/RHC (7/17): Patent OM stent, no obstructive CAD.  Mean RA 13, PA 45/25, mean PCWP 16, CI 2.36.  - Echo (1/18): EF 30-35% with diffuse hypokinesis, mildly decreased RV systolic function, moderate TR and MR, PASP 53 mmHg, severe LAE.  - St Jude CRT-P placed with AV nodal ablation in 3/18.  - Echo (9/18): EF 40-45%, mild LVH, RV normal size and systolic function, PASP 33 mmHg.  3. CAD: DES to OM in 2007.  LHC (7/17) with patent stent and no new obstructive disease.  4. Type  II diabetes 5. Hyperlipidemia 6. TIA 7. Possible amiodarone lung toxicity: Chest CT (7/17) with bronchiectasis/fibrosis, patchy bilateral densities.   Social History   Social History  . Marital status: Married    Spouse name: N/A  . Number of children: N/A  . Years of education: N/A   Occupational History  . Not on file.   Social History Main Topics  . Smoking status: Former Smoker    Packs/day: 1.00    Types: Cigarettes    Quit date: 07/03/1963  . Smokeless tobacco: Never Used   . Alcohol use No  . Drug use: No  . Sexual activity: No   Other Topics Concern  . Not on file   Social History Narrative   Lives in [place] with husband.  Does not routinely exercise but is able to keep house and go food shopping.   Family History  Problem Relation Age of Onset  . Heart attack Mother   . Stroke Mother   . Heart attack Father   . Heart attack Brother   . Coronary artery disease Sister        CABG  . Alzheimer's disease Sister        X3  . Stroke Sister   . Heart attack Sister    ROS: All systems reviewed and negative except as per HPI.   Current Outpatient Prescriptions  Medication Sig Dispense Refill  . ACCU-CHEK FASTCLIX LANCETS MISC USE TO CHECK BLOOD SUGAR TWICE DAILY  5  . ACCU-CHEK SMARTVIEW test strip     . acetaminophen (TYLENOL) 325 MG tablet Take 2 tablets (650 mg total) by mouth every 4 (four) hours as needed for headache or mild pain.    [name] apixaban (ELIQUIS) 2.5 MG TABS tablet Take 1 tablet (2.5 mg total) by mouth 2 (two) times daily. 60 tablet 3  . atorvastatin (LIPITOR) 20 MG tablet TAKE 1 TABLET BY MOUTH EVERY DAY 90 tablet 2  . bisoprolol (ZEBETA) 5 MG tablet Take 1.5 tablets (7.5 mg total) by mouth daily. 45 tablet 3  . Cholecalciferol (VITAMIN D) 2000 units tablet Take 2,000 Units by mouth daily.    . digoxin (LANOXIN) 0.125 MG tablet Take 0.5 tablets (0.0625 mg total) by mouth every other day. 45 tablet 2  . furosemide (LASIX) 20 MG tablet as directed. Take 20 mg (1 tab) once daily alternating with 40 mg (2 tabs) the next day continuously.    [name] HYDROcodone-acetaminophen (NORCO/VICODIN) 5-325 MG tablet Take 1 tablet by mouth daily as needed for pain.    . metFORMIN (GLUCOPHAGE) 500 MG tablet Take 500 mg by mouth daily with supper.     . nitroGLYCERIN (NITROSTAT) 0.4 MG SL tablet Place 1 tablet (0.4 mg total) under the tongue every 5 (five) minutes x 3 doses as needed for chest pain. 25 tablet 3  . spironolactone (ALDACTONE) 25 MG tablet Take 1  tablet (25 mg total) by mouth daily. 90 tablet 3  . vitamin C (ASCORBIC ACID) 500 MG tablet Take 500 mg by mouth daily.     No current facility-administered medications for this encounter.    BP 130/76 (BP Location: Right Arm, Patient Position: Sitting, Cuff Size: Normal)   Pulse 76   Wt 127 lb 12.8 oz (58 kg)   SpO2 95%   BMI 23.37 kg/m  General: NAD, thin.  Neck: No JVD, no thyromegaly or thyroid nodule.  Lungs: Clear to auscultation bilaterally with normal respiratory effort. CV: Nondisplaced PMI.  Heart regular S1/S2, no S3/S4, 1/6  SEM RUSB.  No peripheral edema.  No carotid bruit.  Normal pedal pulses.  Abdomen: Soft, nontender, no hepatosplenomegaly, no distention.  Skin: Intact without lesions or rashes.  Neurologic: Alert and oriented x 3.  Psych: Normal affect. Extremities: No clubbing or cyanosis.  HEENT: Normal.   Assessment/Plan: 1. Chronic systolic CHF: EF 68-59% on 7/17 echo, 30-35% on 1/18 echo.  Nonischemic cardiomyopathy, possible role for tachycardia-mediated cardiomyopathy (some improvement in EF with rate control but not back to normal). She had AV nodal ablation due to difficult-to-control atrial fibrillation in 3/18 with St Jude CRT-P placement. Echo in 9/18 post-ablation showed EF up to 40-45%.  NYHA class II symptoms.  She looks euvolemic.   - Continue Lasix 40 daily alternating with 20 mg daily.   - Continue digoxin, check level today.   - Unable to tolerate losartan at even low dose.  - Increase bisoprolol to 7.5 mg daily.   - Continue spironolactone 25 mg daily. BMET today.  2. Atrial fibrillation/flutter:  Concern for tachy-mediated cardiomyopathy.  She had suspected amiodarone-induced lung toxicity so ranolazine was started.  She did not tolerate ranolazine due to nausea. Poor Tikosyn candidate with CKD.   She tolerated atrial fibrillation poorly and rate was difficult to control, so she had AV nodal ablation with St Jude CRT-P device.  She seems to be doing  better since the ablation.  - Continue apixaban 2.5 mg bid.  Check CBC today.  3. CAD: Patent OM stent on coronary angiography in 7/17.  Continue statin. No ASA with warfarin use.  Good lipids in 1/18.  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  Off amiodarone.  Follows with pulmonary.   Followup in 3 months  [name] 04/24/2017

## 2017-04-29 ENCOUNTER — Other Ambulatory Visit (HOSPITAL_COMMUNITY): Payer: Self-pay | Admitting: Cardiology

## 2017-04-29 MED ORDER — APIXABAN 2.5 MG PO TABS: 2.5000 mg | ORAL_TABLET | Freq: Two times a day (BID) | ORAL | 3 refills | Status: DC

## 2017-06-28 ENCOUNTER — Other Ambulatory Visit (HOSPITAL_COMMUNITY): Payer: Self-pay | Admitting: Cardiology

## 2017-06-28 MED ORDER — FUROSEMIDE 20 MG PO TABS: ORAL_TABLET | ORAL | 1 refills | Status: DC

## 2017-07-11 DIAGNOSIS — Z23 Encounter for immunization: Secondary | ICD-10-CM | POA: Diagnosis not present

## 2017-07-11 DIAGNOSIS — M859 Disorder of bone density and structure, unspecified: Secondary | ICD-10-CM | POA: Diagnosis not present

## 2017-07-11 DIAGNOSIS — I1310 Hypertensive heart and chronic kidney disease without heart failure, with stage 1 through stage 4 chronic kidney disease, or unspecified chronic kidney disease: Secondary | ICD-10-CM | POA: Diagnosis not present

## 2017-07-11 DIAGNOSIS — E559 Vitamin D deficiency, unspecified: Secondary | ICD-10-CM | POA: Diagnosis not present

## 2017-07-11 DIAGNOSIS — E1151 Type 2 diabetes mellitus with diabetic peripheral angiopathy without gangrene: Secondary | ICD-10-CM | POA: Diagnosis not present

## 2017-07-11 DIAGNOSIS — Z6822 Body mass index (BMI) 22.0-22.9, adult: Secondary | ICD-10-CM | POA: Diagnosis not present

## 2017-07-11 DIAGNOSIS — I10 Essential (primary) hypertension: Secondary | ICD-10-CM | POA: Diagnosis not present

## 2017-07-11 DIAGNOSIS — I208 Other forms of angina pectoris: Secondary | ICD-10-CM | POA: Diagnosis not present

## 2017-07-11 DIAGNOSIS — I509 Heart failure, unspecified: Secondary | ICD-10-CM | POA: Diagnosis not present

## 2017-07-11 DIAGNOSIS — I2510 Atherosclerotic heart disease of native coronary artery without angina pectoris: Secondary | ICD-10-CM | POA: Diagnosis not present

## 2017-07-11 DIAGNOSIS — H6123 Impacted cerumen, bilateral: Secondary | ICD-10-CM | POA: Diagnosis not present

## 2017-07-11 DIAGNOSIS — D692 Other nonthrombocytopenic purpura: Secondary | ICD-10-CM | POA: Diagnosis not present

## 2017-07-11 DIAGNOSIS — E7800 Pure hypercholesterolemia, unspecified: Secondary | ICD-10-CM | POA: Diagnosis not present

## 2017-09-09 ENCOUNTER — Other Ambulatory Visit (HOSPITAL_COMMUNITY): Payer: Self-pay | Admitting: *Deleted

## 2017-09-09 MED ORDER — DIGOXIN 125 MCG PO TABS: 0.0625 mg | ORAL_TABLET | ORAL | 3 refills | Status: DC

## 2017-09-16 ENCOUNTER — Other Ambulatory Visit (HOSPITAL_COMMUNITY): Payer: Self-pay | Admitting: *Deleted

## 2017-09-16 MED ORDER — NITROGLYCERIN 0.4 MG SL SUBL: 0.4000 mg | SUBLINGUAL_TABLET | SUBLINGUAL | 3 refills | Status: DC | PRN

## 2017-09-17 ENCOUNTER — Other Ambulatory Visit (HOSPITAL_COMMUNITY): Payer: Self-pay | Admitting: Cardiology

## 2017-09-26 ENCOUNTER — Encounter: Payer: Self-pay | Admitting: Cardiology

## 2017-09-26 ENCOUNTER — Ambulatory Visit (HOSPITAL_COMMUNITY)
Admission: RE | Admit: 2017-09-26 | Discharge: 2017-09-26 | Disposition: A | Discharge status: Home / Self Care | Payer: PPO | Source: Ambulatory Visit | Attending: Cardiology | Admitting: Cardiology

## 2017-09-26 ENCOUNTER — Encounter (HOSPITAL_COMMUNITY): Payer: Self-pay | Admitting: Cardiology

## 2017-09-26 ENCOUNTER — Other Ambulatory Visit: Payer: Self-pay

## 2017-09-26 VITALS — BP 105/60 | HR 60 | Wt 124.5 lb

## 2017-09-26 DIAGNOSIS — Z7984 Long term (current) use of oral hypoglycemic drugs: Secondary | ICD-10-CM | POA: Diagnosis not present

## 2017-09-26 DIAGNOSIS — I2510 Atherosclerotic heart disease of native coronary artery without angina pectoris: Secondary | ICD-10-CM | POA: Diagnosis not present

## 2017-09-26 DIAGNOSIS — I428 Other cardiomyopathies: Secondary | ICD-10-CM

## 2017-09-26 DIAGNOSIS — Z7901 Long term (current) use of anticoagulants: Secondary | ICD-10-CM | POA: Diagnosis not present

## 2017-09-26 DIAGNOSIS — Z8673 Personal history of transient ischemic attack (TIA), and cerebral infarction without residual deficits: Secondary | ICD-10-CM | POA: Diagnosis not present

## 2017-09-26 DIAGNOSIS — T462X5A Adverse effect of other antidysrhythmic drugs, initial encounter: Secondary | ICD-10-CM | POA: Diagnosis not present

## 2017-09-26 DIAGNOSIS — Z87891 Personal history of nicotine dependence: Secondary | ICD-10-CM | POA: Diagnosis not present

## 2017-09-26 DIAGNOSIS — I480 Paroxysmal atrial fibrillation: Secondary | ICD-10-CM | POA: Diagnosis not present

## 2017-09-26 DIAGNOSIS — Z9889 Other specified postprocedural states: Secondary | ICD-10-CM | POA: Diagnosis not present

## 2017-09-26 DIAGNOSIS — I4892 Unspecified atrial flutter: Secondary | ICD-10-CM | POA: Diagnosis not present

## 2017-09-26 DIAGNOSIS — I481 Persistent atrial fibrillation: Secondary | ICD-10-CM | POA: Insufficient documentation

## 2017-09-26 DIAGNOSIS — I5022 Chronic systolic (congestive) heart failure: Secondary | ICD-10-CM | POA: Diagnosis not present

## 2017-09-26 DIAGNOSIS — J984 Other disorders of lung: Secondary | ICD-10-CM | POA: Diagnosis not present

## 2017-09-26 DIAGNOSIS — E119 Type 2 diabetes mellitus without complications: Secondary | ICD-10-CM | POA: Insufficient documentation

## 2017-09-26 DIAGNOSIS — Z79899 Other long term (current) drug therapy: Secondary | ICD-10-CM | POA: Diagnosis not present

## 2017-09-26 DIAGNOSIS — E785 Hyperlipidemia, unspecified: Secondary | ICD-10-CM | POA: Insufficient documentation

## 2017-09-26 LAB — CBC
HEMATOCRIT: 35.2 % — AB (ref 36.0–46.0)
Hemoglobin: 11.8 g/dL — ABNORMAL LOW (ref 12.0–15.0)
MCH: 34.4 pg — ABNORMAL HIGH (ref 26.0–34.0)
MCHC: 33.5 g/dL (ref 30.0–36.0)
MCV: 102.6 fL — AB (ref 78.0–100.0)
Platelets: 168 10*3/uL (ref 150–400)
RBC: 3.43 MIL/uL — ABNORMAL LOW (ref 3.87–5.11)
RDW: 14.2 % (ref 11.5–15.5)
WBC: 7.9 10*3/uL (ref 4.0–10.5)

## 2017-09-26 LAB — BASIC METABOLIC PANEL
ANION GAP: 12 (ref 5–15)
BUN: 36 mg/dL — ABNORMAL HIGH (ref 6–20)
CO2: 24 mmol/L (ref 22–32)
Calcium: 9.4 mg/dL (ref 8.9–10.3)
Chloride: 100 mmol/L — ABNORMAL LOW (ref 101–111)
Creatinine, Ser: 1.76 mg/dL — ABNORMAL HIGH (ref 0.44–1.00)
GFR calc non Af Amer: 26 mL/min — ABNORMAL LOW (ref >60)
GFR, EST AFRICAN AMERICAN: 30 mL/min — AB (ref >60)
GLUCOSE: 228 mg/dL — AB (ref 65–99)
Potassium: 3.9 mmol/L (ref 3.5–5.1)
Sodium: 136 mmol/L (ref 135–145)

## 2017-09-26 MED ORDER — FUROSEMIDE 20 MG PO TABS
ORAL_TABLET | ORAL | 3 refills | Status: DC
Start: 2017-09-26 — End: 2017-12-29

## 2017-09-26 NOTE — Patient Instructions (Addendum)
Increase Furosemide to 40 mg (2 tabs), alternating 20 mg (1 tab) every other day  Labs drawn today (if we do not call you, then your lab work was stable)   Your physician has requested that you have an echocardiogram. Echocardiography is a painless test that uses sound waves to create images of your heart. It provides your doctor with information about the size and shape of your heart and how well your heart's chambers and valves are working. This procedure takes approximately one hour. There are no restrictions for this procedure.  Your physician recommends that you schedule a follow-up appointment in: 6 months (September, 2019) with Dr. [name]  an a echocardiogram Please Call an Schedule Appointment

## 2017-09-26 NOTE — Progress Notes (Signed)
Patient ID: Shannon [name], female   DOB: 03/18/1932, 84 y.o.   MRN: [id number] PCP: Dr. Osborne Casco Cardiology: Dr. [name]  84 yo with paroxysmal atrial fibrillation, chronic systolic CHF, and CAD presents for cardiology followup. She apparently had been in persistent atrial fibrillation since around 8/16.  It appears that she was tachycardic most of the time prior to her recent admission in 7/17.  She had been on amiodarone prior to admission but it was not keeping her in NSR.  She was admitted with acute systolic CHF.  EF had dropped to 20-25% in 7/17 from 50-55% in 10/16.  She was in atrial fibrillation with RVR.  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  Amiodarone was stopped due to suspicion for amiodarone-induced lung toxicity (she was seen by pulmonary).  She was eventually cardioverted to NSR.  Ranolazine was started to try to keep her in NSR.  HR was low in the hospital so she has not been on a beta blocker.  She was extensively diuresed and discharged to SNF.  After discharge, she developed persistent nausea.  Eventually, it was found that the nausea was likely from ranolazine, so this medication was stopped and the nausea resolved.    Patient continued to have difficult-to-control atrial fibrillation.  GFR was too low for Tikosyn.  She therefore had AV nodal ablation with St Jude CRT-P device placed in 3/18.  She developed a pocket hematoma and had pocket revision with sub-pectoral placement in 5/18.   Today she returns for HF follow up. Overall feeling fine. Denies SOB/PND/Orthopnea. No bleeding problems. Appetite ok. No fever or chills. Weight at home 121 pounds.  pounds. Taking all medications. Says she cut back lasix to 20 mg daily about 3 weeks ago.   Labs (7/17): Digoxin 1.6 => 0.3, K 4.6, creatinine 0.87 => 0.76, HCT 34, BNP 265 Labs (9/17): K 5, creatinine 1.36 Labs (11/17): K 4.9, creatinine 1.29, hgb 9.2, digoxin 0.5 Labs (12/17): hgb  10.4, digoxin 0.4 Labs (1/18): K 4, creatinine 1.62 => 1.49, hgb 9.5, LDL 42, HDL 63 Labs (3/18): K 4.1, creatinine 1.44, digoxin 0.4 Labs (6/18): K 3.9, creatinine 1.7, hgb 11.5, LDL 56, HDL 50  PMH: 1. Atrial fibrillation: Paroxysmal.  Had been persistent since 8/16, was cardioverted to NSR during 7/17 admission.  Possible atypical atrial flutter on 12/17 ECG.  By 3/18, atrial fibrillation again.  - Nausea with ranolazine used for atrial fibrillation suppression.  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  - AV nodal ablation 3/18 with CRT-P.  2. 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  She had been in atrial fibrillation with elevated rate possibly for months prior to 7/17 admission.  - Echo (10/16): EF 50-55%.  - Echo (7/17): EF 20-25%, mildly decreased RV systolic function, severe TR, PA systolic pressure 64 mmHg.  - LHC/RHC (7/17): Patent OM stent, no obstructive CAD.  Mean RA 13, PA 45/25, mean PCWP 16, CI 2.36.  - Echo (1/18): EF 30-35% with diffuse hypokinesis, mildly decreased RV systolic function, moderate TR and MR, PASP 53 mmHg, severe LAE.  - St Jude CRT-P placed with AV nodal ablation in 3/18.  3. CAD: DES to OM in 2007.  LHC (7/17) with patent stent and no new obstructive disease.  4. Type II diabetes 5. Hyperlipidemia 6. TIA 7. Possible amiodarone lung toxicity: Chest CT (7/17) with bronchiectasis/fibrosis, patchy bilateral densities.   Social History   Socioeconomic History  . Marital status: Married    Spouse name:  Not on file  . Number of children: Not on file  . Years of education: Not on file  . Highest education level: Not on file  Occupational History  . Not on file  Social Needs  . Financial resource strain: Not on file  . Food insecurity:    Worry: Not on file    Inability: Not on file  . Transportation needs:    Medical: Not on file    Non-medical: Not on file  Tobacco Use  . Smoking status: Former Smoker     Packs/day: 1.00    Types: Cigarettes    Last attempt to quit: 07/03/1963    Years since quitting: 54.2  . Smokeless tobacco: Never Used  Substance and Sexual Activity  . Alcohol use: No  . Drug use: No  . Sexual activity: Never  Lifestyle  . Physical activity:    Days per week: Not on file    Minutes per session: Not on file  . Stress: Not on file  Relationships  . Social connections:    Talks on phone: Not on file    Gets together: Not on file    Attends religious service: Not on file    Active member of club or organization: Not on file    Attends meetings of clubs or organizations: Not on file    Relationship status: Not on file  . Intimate partner violence:    Fear of current or ex partner: Not on file    Emotionally abused: Not on file    Physically abused: Not on file    Forced sexual activity: Not on file  Other Topics Concern  . Not on file  Social History Narrative   Lives in [place] with husband.  Does not routinely exercise but is able to keep house and go food shopping.   Family History  Problem Relation Age of Onset  . Heart attack Mother   . Stroke Mother   . Heart attack Father   . Heart attack Brother   . Coronary artery disease Sister        CABG  . Alzheimer's disease Sister        X3  . Stroke Sister   . Heart attack Sister    ROS: All systems reviewed and negative except as per HPI.   Current Outpatient Medications  Medication Sig Dispense Refill  . ACCU-CHEK FASTCLIX LANCETS MISC USE TO CHECK BLOOD SUGAR TWICE DAILY  5  . ACCU-CHEK SMARTVIEW test strip     . acetaminophen (TYLENOL) 325 MG tablet Take 2 tablets (650 mg total) by mouth every 4 (four) hours as needed for headache or mild pain.    [name] apixaban (ELIQUIS) 2.5 MG TABS tablet Take 1 tablet (2.5 mg total) by mouth 2 (two) times daily. 180 tablet 3  . atorvastatin (LIPITOR) 20 MG tablet TAKE 1 TABLET BY MOUTH EVERY DAY 90 tablet 2  . bisoprolol (ZEBETA) 5 MG tablet Take 1.5 tablets (7.5 mg  total) by mouth daily. 45 tablet 3  . Cholecalciferol (VITAMIN D) 2000 units tablet Take 2,000 Units by mouth daily.    . digoxin (LANOXIN) 0.125 MG tablet Take 0.5 tablets (0.0625 mg total) by mouth every other day. 45 tablet 3  . furosemide (LASIX) 20 MG tablet Take 20 mg by mouth daily.    [name] HYDROcodone-acetaminophen (NORCO/VICODIN) 5-325 MG tablet Take 1 tablet by mouth daily as needed for pain.    . metFORMIN (GLUCOPHAGE) 500 MG tablet Take 500 mg  by mouth daily with supper.     . nitroGLYCERIN (NITROSTAT) 0.4 MG SL tablet TAKE 1 TABLET BY MOUTH UNDER THE TONGUE EVERY 5 MINUTES FOR 3 DOSES AS NEEDED FOR CHEST PAIN 25 tablet 2  . spironolactone (ALDACTONE) 25 MG tablet Take 1 tablet (25 mg total) by mouth daily. 90 tablet 3  . vitamin C (ASCORBIC ACID) 500 MG tablet Take 500 mg by mouth daily.     No current facility-administered medications for this encounter.    BP 105/60   Pulse 60   Wt 124 lb 8 oz (56.5 kg)   SpO2 100%   BMI 22.77 kg/m  General:  Elderly. Well appearing. No resp difficulty HEENT: normal Neck: supple. JVP ~10. Carotids 2+ bilat; no bruits. No lymphadenopathy or thryomegaly appreciated. Cor: PMI nondisplaced. Regular rate & rhythm. No rubs, gallops or murmurs. Lungs: clear Abdomen: soft, nontender, nondistended. No hepatosplenomegaly. No bruits or masses. Good bowel sounds. Extremities: no cyanosis, clubbing, rash, edema Neuro: alert & orientedx3, cranial nerves grossly intact. moves all 4 extremities w/o difficulty. Affect pleasant   Assessment/Plan: 1. Chronic systolic CHF: EF 41-74% on 7/17 echo, 30-35% on 1/18 echo.  Nonischemic cardiomyopathy, possible role for tachycardia-mediated cardiomyopathy (some improvement in EF with rate control but not back to normal). ECHO 03/2017 EF 40-45%  She had AV nodal ablation due to difficult-to-control atrial fibrillation in 3/18 with St Jude CRT-P placement. [place]vue fluid elevated.  NYHA II. Volume status elevated.  Increase lasix to 40 mg one day then 20 mg the next day.   - Continue lasix 20 mg daily   - Continue digoxin - Unable to tolerate losartan at even low dose.  - Continue bisoprolol 5 mg daily.  - Continue spironolactone 25 mg daily.  -Check BMET 2. Atrial fibrillation/flutter:  Concern for tachy-mediated cardiomyopathy.  She had suspected amiodarone-induced lung toxicity so ranolazine was started.  She did not tolerate ranolazine due to nausea. Poor Tikosyn candidate with CKD.   She tolerated atrial fibrillation poorly and rate was difficult to control, so she had AV nodal ablation with St Jude CRT-P device.  She seems to be doing better since the ablation.  - Continue apixaban 2.5 mg bid.  - Check CBC  3. CAD: Patent OM stent on coronary angiography in 7/17.   No s/s ischemia.  Continue statin. No ASA with warfarin use.  Good lipids in 1/18.  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  Off amiodarone.  Follows with pulmonary.   Follow up in 6 months and an ECHO.   Nihaal Friesen NP_c  09/26/2017

## 2017-10-17 IMAGING — DX DG CHEST 2V
2 series · 2 of 2 positions shown · non-contrast
Comparison: 02/02/2016

CLINICAL DATA: Pacemaker placed yesterday.

EXAM:
CHEST  2 VIEW

[chest lat]
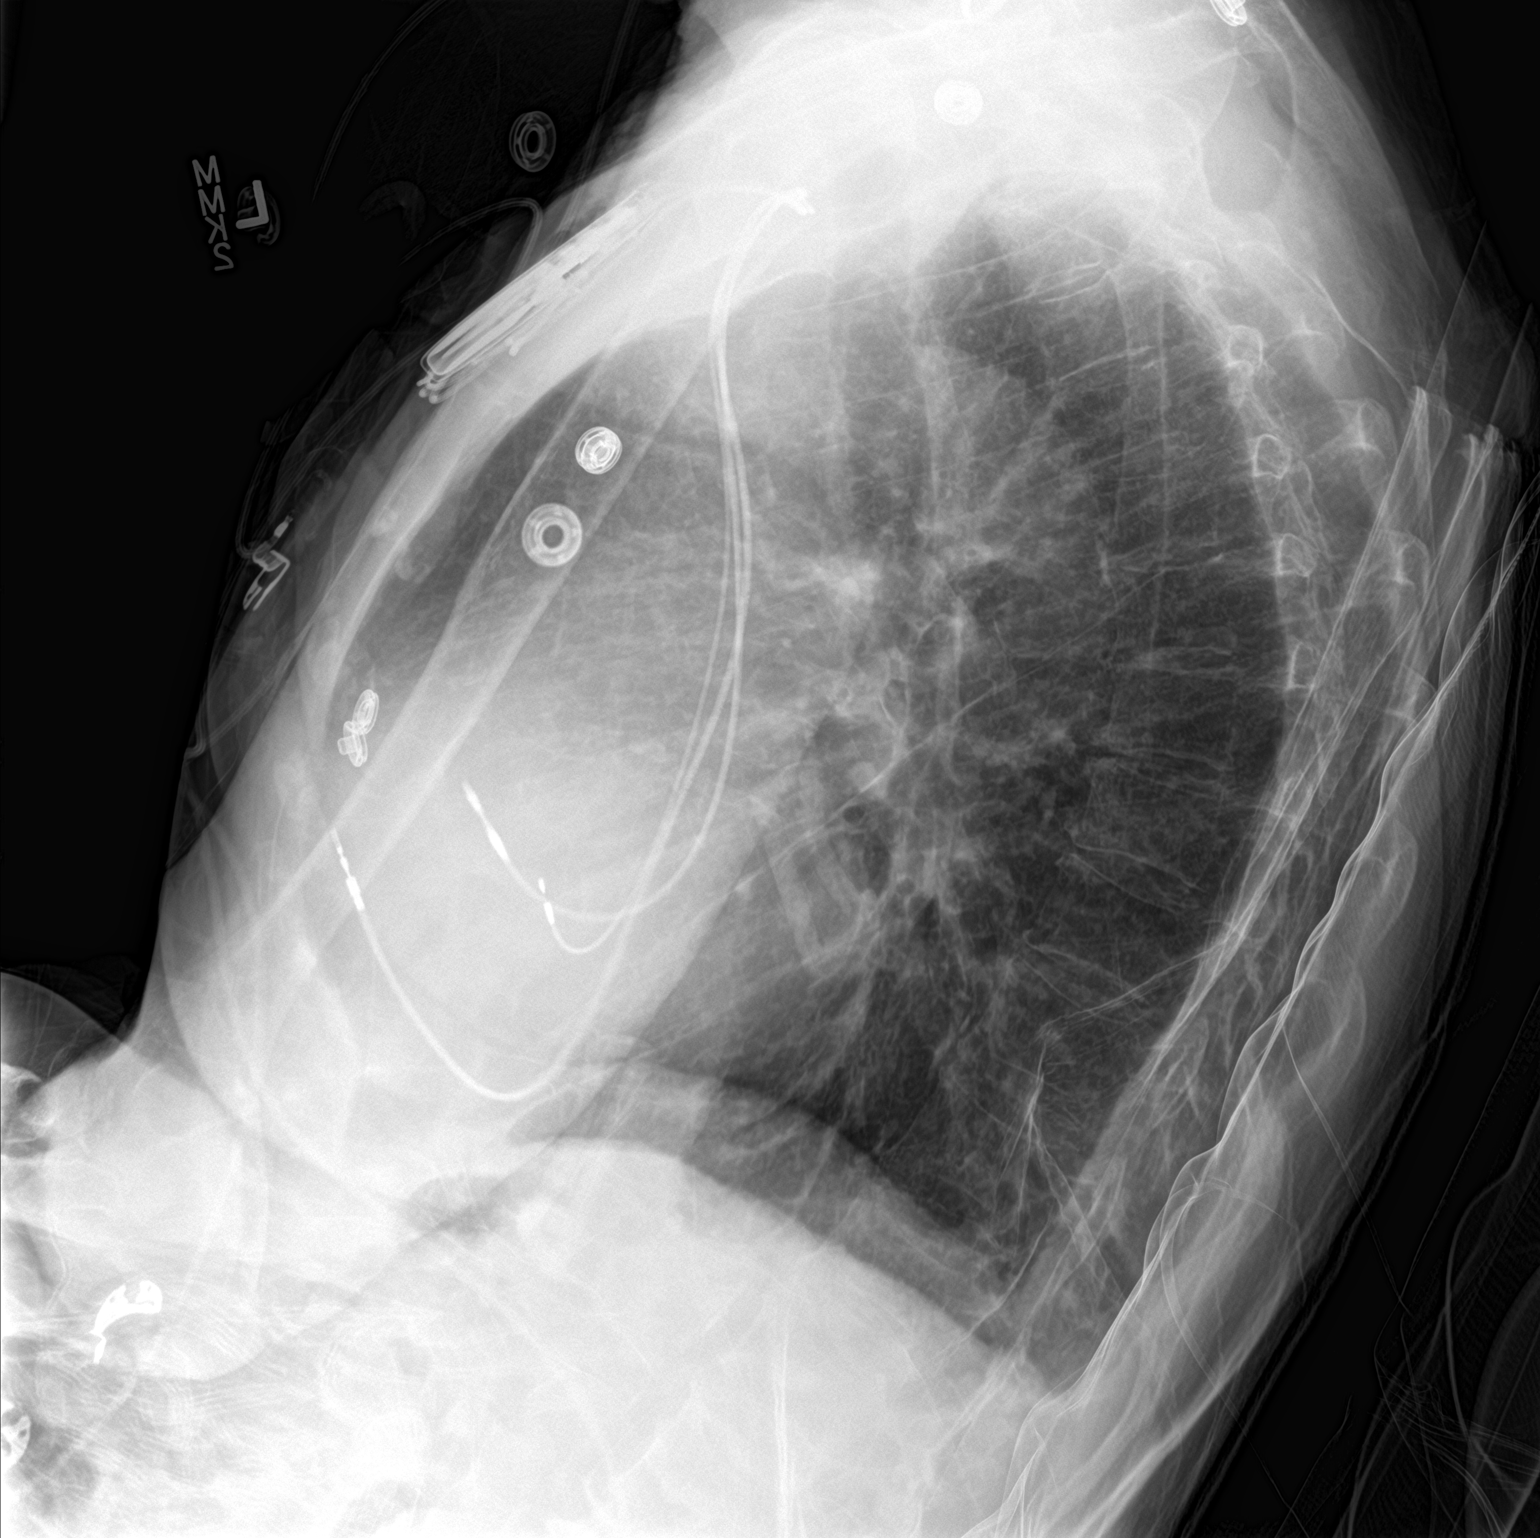

[chest ap]
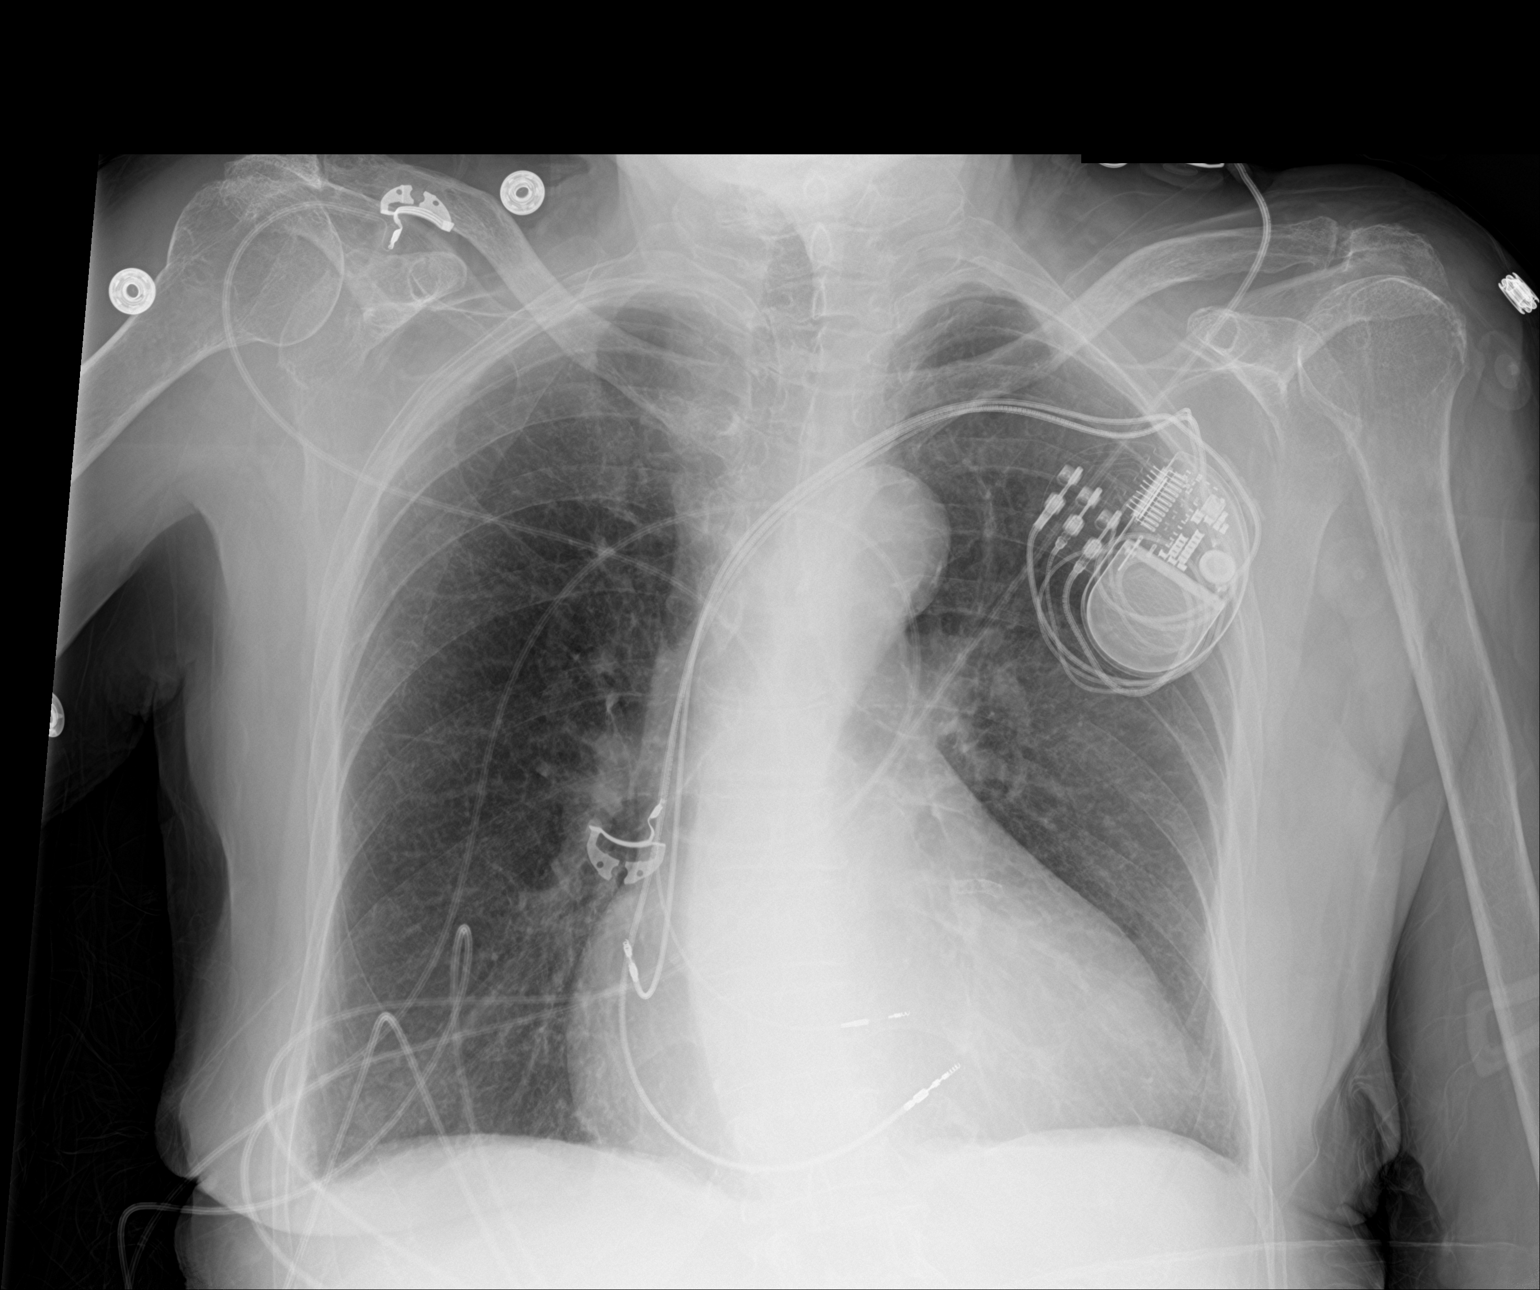

[2 of 2 positions shown; findings below may reference images not displayed]

FINDINGS: Pacemaker on the left, inserted from a left subclavian approach. 3
leads. The heart remains mildly enlarged. There is chronic aortic
atherosclerosis. Lungs are clear except for chronic interstitial
markings. No edema, infiltrate, collapse or pneumothorax. No acute
bone finding.
IMPRESSION: Good appearance following pacemaker placement.

## 2017-10-21 ENCOUNTER — Other Ambulatory Visit (HOSPITAL_COMMUNITY): Payer: Self-pay | Admitting: *Deleted

## 2017-10-21 MED ORDER — BISOPROLOL FUMARATE 5 MG PO TABS: 5.0000 mg | ORAL_TABLET | Freq: Every day | ORAL | 3 refills | Status: DC

## 2017-10-24 ENCOUNTER — Other Ambulatory Visit (HOSPITAL_COMMUNITY): Payer: Self-pay

## 2017-10-24 MED ORDER — BISOPROLOL FUMARATE 5 MG PO TABS: 7.5000 mg | ORAL_TABLET | Freq: Every day | ORAL | 3 refills | Status: DC

## 2017-12-05 ENCOUNTER — Ambulatory Visit (INDEPENDENT_AMBULATORY_CARE_PROVIDER_SITE_OTHER): Payer: PPO

## 2017-12-05 ENCOUNTER — Encounter (INDEPENDENT_AMBULATORY_CARE_PROVIDER_SITE_OTHER): Payer: Self-pay | Admitting: Orthopedic Surgery

## 2017-12-05 ENCOUNTER — Ambulatory Visit (INDEPENDENT_AMBULATORY_CARE_PROVIDER_SITE_OTHER): Payer: Commercial Managed Care - HMO | Admitting: Orthopedic Surgery

## 2017-12-05 VITALS — Ht 62.0 in | Wt 124.0 lb

## 2017-12-05 DIAGNOSIS — M25561 Pain in right knee: Secondary | ICD-10-CM | POA: Diagnosis not present

## 2017-12-05 DIAGNOSIS — M1711 Unilateral primary osteoarthritis, right knee: Secondary | ICD-10-CM | POA: Diagnosis not present

## 2017-12-05 DIAGNOSIS — G8929 Other chronic pain: Secondary | ICD-10-CM | POA: Diagnosis not present

## 2017-12-05 DIAGNOSIS — I87323 Chronic venous hypertension (idiopathic) with inflammation of bilateral lower extremity: Secondary | ICD-10-CM | POA: Diagnosis not present

## 2017-12-05 MED ORDER — LIDOCAINE HCL 1 % IJ SOLN
5.0000 mL | INTRAMUSCULAR | Status: AC | PRN
  Administered 2017-12-05: 5 mL

## 2017-12-05 MED ORDER — METHYLPREDNISOLONE ACETATE 40 MG/ML IJ SUSP
40.0000 mg | INTRAMUSCULAR | Status: AC | PRN
  Administered 2017-12-05: 40 mg via INTRA_ARTICULAR

## 2017-12-05 NOTE — Progress Notes (Signed)
Office Visit Note   Patient: [name]           Date of Birth: Sep 20, 1931           MRN: [id number] Visit Date: 12/05/2017              Requested by: Haywood Pao, MD [address] [place], [place] [address] PCP: Osborne Casco Fransico Him, MD  Chief Complaint  Patient presents with  . Right Knee - Pain      HPI: Patient is an 84 year old woman who presents complaining of arthritis in her right knee.  She complains of pain with ambulation she denies locking popping or giving way.  She states the pain is sharp.  Patient also has venous insufficiency both legs and she states she normally wears knee-high compression stockings.  Assessment & Plan: Visit Diagnoses:  1. Chronic pain of right knee   2. Unilateral primary osteoarthritis, right knee   3. Idiopathic chronic venous hypertension of both lower extremities with inflammation     Plan: Continue with the compression stockings continue with her cane follow-up as needed for repeat steroid injection for the right knee.  Follow-Up Instructions: Return if symptoms worsen or fail to improve.   Ortho Exam  Patient is alert, oriented, no adenopathy, well-dressed, normal affect, normal respiratory effort. Examination patient has an antalgic gait she uses a cane.  There is no effusion of the right knee she is tender to palpation over the medial and lateral joint line Clauser cruciates are stable there is no effusion.  Examination of both legs she has venous stasis insufficiency but no open ulcers.  Imaging: Xr Knee 1-2 Views Right  Result Date: 12/05/2017 2 view radiographs of the right knee shows tricompartmental arthritic changes with periarticular bony spurs in the patellofemoral joint as well as medial lateral joint lines.  There is joint space narrowing.  No images are attached to the encounter.  Labs: Lab Results  Component Value Date   HGBA1C 6.8 (H) 10/09/2015   ESRSEDRATE 28 (H) 01/10/2016   ESRSEDRATE 8  12/01/2015   CRP 2.2 (H) 12/01/2015   REPTSTATUS 10/11/2012 FINAL 10/09/2012   CULT ESCHERICHIA COLI 10/09/2012   LABORGA ESCHERICHIA COLI 10/09/2012     Lab Results  Component Value Date   ALBUMIN 3.6 07/18/2016   ALBUMIN 2.5 (L) 12/31/2015   ALBUMIN 2.7 (L) 12/30/2015    Body mass index is 22.68 kg/m.  Orders:  Orders Placed This Encounter  Procedures  . Large Joint Inj  . XR Knee 1-2 Views Right   No orders of the defined types were placed in this encounter.    Procedures: Large Joint Inj: R knee on 12/05/2017 11:14 AM Indications: pain and diagnostic evaluation Details: 22 G 1.5 in needle, anteromedial approach  Arthrogram: No  Medications: 5 mL lidocaine 1 %; 40 mg methylPREDNISolone acetate 40 MG/ML Outcome: tolerated well, no immediate complications Procedure, treatment alternatives, risks and benefits explained, specific risks discussed. Consent was given by the patient. Immediately prior to procedure a time out was called to verify the correct patient, procedure, equipment, support staff and site/side marked as required. Patient was prepped and draped in the usual sterile fashion.      Clinical Data: No additional findings.  ROS:  All other systems negative, except as noted in the HPI. Review of Systems  Objective: Vital Signs: Ht 5\' 2"  (1.575 m)   Wt 124 lb (56.2 kg)   BMI 22.68 kg/m   Specialty Comments:  No specialty comments available.  PMFS History: Patient Active Problem List   Diagnosis Date Noted  . Idiopathic chronic venous hypertension of both lower extremities with inflammation 12/05/2017  . Unilateral primary osteoarthritis, right knee 12/05/2017  . Pacemaker pocket hematoma 11/02/2016  . Post-operative pain 11/02/2016  . Cardiac device in situ   . Complete heart block ([place]) 09/27/2016  . Abnormal CT of the chest 03/29/2016  . DNR (do not resuscitate) discussion   . 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 Anxiety 02/03/2016    . Paroxysmal atrial fibrillation ([place]) 01/26/2016  . Palliative care encounter   . Advanced care planning/counseling discussion   . Dyspnea   . Shortness of breath   . Pleural effusion   . Persistent atrial fibrillation ([place])   . Pressure ulcer 01/09/2016  . SOB (shortness of breath)   . Elevated troponin   . Coronary artery disease involving native coronary artery of native heart without angina pectoris   . Cardiomyopathy ([place])   . Chronic systolic CHF (congestive heart failure) ([place])   . Abdominal pain 12/31/2015  . Long term (current) use of anticoagulants [Z79.01] 10/24/2015  . Foot ulcer, left ([place])   . Chronic venous stasis dermatitis 10/09/2015  . Cellulitis of toe, left great 10/09/2015  . Edema 01/31/2015  . Atrial flutter ([place]) 10/09/2012  . UTI (urinary tract infection) 10/09/2012  . MVP (mitral valve prolapse)   . Hyperlipidemia   . Diabetes mellitus ([place])    Past Medical History:  Diagnosis Date  . Arthritis   . Baker's cyst of knee   . CAD (coronary artery disease)    a. 05/2006 Cath/PCI: LAD 55m, D1 40 ost, LCX nl, OM1 95 (3.0x20 Taxus DES), RCA nl, EF 40% w/ lat AK;  04/2007 Ex MV: minimal lat ischemia in area of prior infarct->low risk-> med Rx.  . Chronic atrial fibrillation (HCC)    a. on Coumadin  . Chronic systolic (congestive) heart failure ([place])    a. 04/2015: EF 50-55% b. echo 12/2015: EF 20-25% w/ diffuse HK, biatrial enlargement, mild AI and MR, severe TR    . Diabetes mellitus   . Hyperlipidemia   . MVP (mitral valve prolapse)    a. 10/2003 Echo: nl LV fxn, mild MR/TR/AS  . Myocardial infarction ([place])   . Sleep apnea   . Transient ischemic attack     Family History  Problem Relation Age of Onset  . Heart attack Mother   . Stroke Mother   . Heart attack Father   . Heart attack Brother   . Coronary artery disease Sister        CABG  . Alzheimer's disease Sister        X3  . Stroke Sister   . Heart attack Sister     Past Surgical  History:  Procedure Laterality Date  . ATRIAL FLUTTER ABLATION N/A 10/09/2012   Procedure: ATRIAL FLUTTER ABLATION;  Surgeon: Thompson Grayer, MD;  Location: [facility] CATH LAB;  Service: Cardiovascular;  Laterality: N/A;  . AV NODE ABLATION N/A 09/27/2016   Procedure: AV Node Ablation;  Surgeon: Thompson Grayer, MD;  Location: [place] [facility] CV LAB;  Service: Cardiovascular;  Laterality: N/A;  . BIV PACEMAKER INSERTION CRT-P N/A 09/27/2016   Procedure: BiV Pacemaker Insertion CRT-P;  Surgeon: Thompson Grayer, MD;  Location: [place] CV LAB;  Service: Cardiovascular;  Laterality: N/A;  . CARDIAC CATHETERIZATION N/A 01/06/2016   Procedure: Right/Left Heart Cath and Coronary Angiography;  Surgeon: Belva Crome, MD;  Location: [place] CV LAB;  Service: Cardiovascular;  Laterality: N/A;  . CARDIOVERSION N/A 01/11/2016   Procedure: CARDIOVERSION;  Surgeon: Larey Dresser, MD;  Location: [facility] ENDOSCOPY;  Service: Cardiovascular;  Laterality: N/A;  . cataracts    . COLONOSCOPY N/A 10/13/2015   Procedure: COLONOSCOPY;  Surgeon: Manus Gunning, MD;  Location: [facility] ENDOSCOPY;  Service: Gastroenterology;  Laterality: N/A;  . CORONARY ANGIOPLASTY     Status post PTCA and stenting of the first obtuse marginal-05/13/2006. We placed a 3.0 x 20 mm Taxus stent. It was post dilated using a 3.25 mm noncompliant balloon up to 14 atmospheres  . IMPLANT POCKET INCISION & DRAINAGE N/A 11/02/2016   Procedure: Implant Pocket Incision & Drainage;  Surgeon: Evans Lance, MD;  Location: [place] CV LAB;  Service: Cardiovascular;  Laterality: N/A;  . TONSILLECTOMY     Social History   Occupational History  . Not on file  Tobacco Use  . Smoking status: Former Smoker    Packs/day: 1.00    Types: Cigarettes    Last attempt to quit: 07/03/1963    Years since quitting: 54.4  . Smokeless tobacco: Never Used  Substance and Sexual Activity  . Alcohol use: No  . Drug use: No  . Sexual activity: Never

## 2017-12-17 DIAGNOSIS — E7800 Pure hypercholesterolemia, unspecified: Secondary | ICD-10-CM | POA: Diagnosis not present

## 2017-12-17 DIAGNOSIS — E1151 Type 2 diabetes mellitus with diabetic peripheral angiopathy without gangrene: Secondary | ICD-10-CM | POA: Diagnosis not present

## 2017-12-17 DIAGNOSIS — I10 Essential (primary) hypertension: Secondary | ICD-10-CM | POA: Diagnosis not present

## 2017-12-17 DIAGNOSIS — E559 Vitamin D deficiency, unspecified: Secondary | ICD-10-CM | POA: Diagnosis not present

## 2017-12-20 DIAGNOSIS — Z7901 Long term (current) use of anticoagulants: Secondary | ICD-10-CM | POA: Diagnosis not present

## 2017-12-20 DIAGNOSIS — Z6821 Body mass index (BMI) 21.0-21.9, adult: Secondary | ICD-10-CM | POA: Diagnosis not present

## 2017-12-20 DIAGNOSIS — I509 Heart failure, unspecified: Secondary | ICD-10-CM | POA: Diagnosis not present

## 2017-12-20 DIAGNOSIS — D692 Other nonthrombocytopenic purpura: Secondary | ICD-10-CM | POA: Diagnosis not present

## 2017-12-20 DIAGNOSIS — D638 Anemia in other chronic diseases classified elsewhere: Secondary | ICD-10-CM | POA: Diagnosis not present

## 2017-12-20 DIAGNOSIS — Z1389 Encounter for screening for other disorder: Secondary | ICD-10-CM | POA: Diagnosis not present

## 2017-12-20 DIAGNOSIS — Z0000 Encounter for general adult medical examination without abnormal findings: Secondary | ICD-10-CM | POA: Diagnosis not present

## 2017-12-20 DIAGNOSIS — I208 Other forms of angina pectoris: Secondary | ICD-10-CM | POA: Diagnosis not present

## 2017-12-20 DIAGNOSIS — E11621 Type 2 diabetes mellitus with foot ulcer: Secondary | ICD-10-CM | POA: Diagnosis not present

## 2017-12-20 DIAGNOSIS — R82998 Other abnormal findings in urine: Secondary | ICD-10-CM | POA: Diagnosis not present

## 2017-12-20 DIAGNOSIS — N183 Chronic kidney disease, stage 3 (moderate): Secondary | ICD-10-CM | POA: Diagnosis not present

## 2017-12-20 DIAGNOSIS — E1151 Type 2 diabetes mellitus with diabetic peripheral angiopathy without gangrene: Secondary | ICD-10-CM | POA: Diagnosis not present

## 2017-12-20 DIAGNOSIS — I1310 Hypertensive heart and chronic kidney disease without heart failure, with stage 1 through stage 4 chronic kidney disease, or unspecified chronic kidney disease: Secondary | ICD-10-CM | POA: Diagnosis not present

## 2017-12-23 ENCOUNTER — Other Ambulatory Visit (HOSPITAL_COMMUNITY): Payer: Self-pay | Admitting: Cardiology

## 2017-12-29 ENCOUNTER — Other Ambulatory Visit: Payer: Self-pay | Admitting: Internal Medicine

## 2017-12-29 ENCOUNTER — Other Ambulatory Visit: Payer: Self-pay | Admitting: Cardiovascular Disease

## 2018-01-04 DIAGNOSIS — T7849XA Other allergy, initial encounter: Secondary | ICD-10-CM | POA: Diagnosis not present

## 2018-02-24 DIAGNOSIS — L97519 Non-pressure chronic ulcer of other part of right foot with unspecified severity: Secondary | ICD-10-CM | POA: Diagnosis not present

## 2018-02-24 DIAGNOSIS — E1151 Type 2 diabetes mellitus with diabetic peripheral angiopathy without gangrene: Secondary | ICD-10-CM | POA: Diagnosis not present

## 2018-02-24 DIAGNOSIS — E11621 Type 2 diabetes mellitus with foot ulcer: Secondary | ICD-10-CM | POA: Diagnosis not present

## 2018-02-24 DIAGNOSIS — Z6822 Body mass index (BMI) 22.0-22.9, adult: Secondary | ICD-10-CM | POA: Diagnosis not present

## 2018-02-24 DIAGNOSIS — E1140 Type 2 diabetes mellitus with diabetic neuropathy, unspecified: Secondary | ICD-10-CM | POA: Diagnosis not present

## 2018-03-26 ENCOUNTER — Ambulatory Visit (HOSPITAL_COMMUNITY)
Admission: RE | Admit: 2018-03-26 | Discharge: 2018-03-26 | Disposition: A | Discharge status: Home / Self Care | Payer: PPO | Source: Ambulatory Visit | Attending: Cardiology | Admitting: Cardiology

## 2018-03-26 DIAGNOSIS — I252 Old myocardial infarction: Secondary | ICD-10-CM | POA: Insufficient documentation

## 2018-03-26 DIAGNOSIS — I088 Other rheumatic multiple valve diseases: Secondary | ICD-10-CM | POA: Diagnosis not present

## 2018-03-26 DIAGNOSIS — I4891 Unspecified atrial fibrillation: Secondary | ICD-10-CM | POA: Insufficient documentation

## 2018-03-26 DIAGNOSIS — I272 Pulmonary hypertension, unspecified: Secondary | ICD-10-CM | POA: Diagnosis not present

## 2018-03-26 DIAGNOSIS — I2510 Atherosclerotic heart disease of native coronary artery without angina pectoris: Secondary | ICD-10-CM | POA: Diagnosis not present

## 2018-03-26 DIAGNOSIS — Z8673 Personal history of transient ischemic attack (TIA), and cerebral infarction without residual deficits: Secondary | ICD-10-CM | POA: Diagnosis not present

## 2018-03-26 DIAGNOSIS — I081 Rheumatic disorders of both mitral and tricuspid valves: Secondary | ICD-10-CM | POA: Diagnosis not present

## 2018-03-26 DIAGNOSIS — E785 Hyperlipidemia, unspecified: Secondary | ICD-10-CM | POA: Insufficient documentation

## 2018-03-26 DIAGNOSIS — E119 Type 2 diabetes mellitus without complications: Secondary | ICD-10-CM | POA: Diagnosis not present

## 2018-03-26 DIAGNOSIS — I5022 Chronic systolic (congestive) heart failure: Secondary | ICD-10-CM | POA: Insufficient documentation

## 2018-03-26 NOTE — Progress Notes (Signed)
  Echocardiogram 2D Echocardiogram has been performed.  Shannon [name] 03/26/2018, 2:50 PM

## 2018-04-06 ENCOUNTER — Other Ambulatory Visit: Payer: Self-pay | Admitting: Internal Medicine

## 2018-04-08 ENCOUNTER — Other Ambulatory Visit: Payer: Self-pay

## 2018-04-08 ENCOUNTER — Ambulatory Visit (HOSPITAL_COMMUNITY)
Admission: RE | Admit: 2018-04-08 | Discharge: 2018-04-08 | Disposition: A | Discharge status: Home / Self Care | Payer: PPO | Source: Ambulatory Visit | Attending: Cardiology | Admitting: Cardiology

## 2018-04-08 ENCOUNTER — Ambulatory Visit (INDEPENDENT_AMBULATORY_CARE_PROVIDER_SITE_OTHER): Payer: PPO | Admitting: *Deleted

## 2018-04-08 VITALS — BP 129/75 | HR 52 | Wt 125.4 lb

## 2018-04-08 DIAGNOSIS — Z8249 Family history of ischemic heart disease and other diseases of the circulatory system: Secondary | ICD-10-CM | POA: Diagnosis not present

## 2018-04-08 DIAGNOSIS — Z8673 Personal history of transient ischemic attack (TIA), and cerebral infarction without residual deficits: Secondary | ICD-10-CM | POA: Insufficient documentation

## 2018-04-08 DIAGNOSIS — I4892 Unspecified atrial flutter: Secondary | ICD-10-CM | POA: Diagnosis not present

## 2018-04-08 DIAGNOSIS — I428 Other cardiomyopathies: Secondary | ICD-10-CM | POA: Insufficient documentation

## 2018-04-08 DIAGNOSIS — Z87891 Personal history of nicotine dependence: Secondary | ICD-10-CM | POA: Diagnosis not present

## 2018-04-08 DIAGNOSIS — R9431 Abnormal electrocardiogram [ECG] [EKG]: Secondary | ICD-10-CM | POA: Insufficient documentation

## 2018-04-08 DIAGNOSIS — I2510 Atherosclerotic heart disease of native coronary artery without angina pectoris: Secondary | ICD-10-CM | POA: Diagnosis not present

## 2018-04-08 DIAGNOSIS — E785 Hyperlipidemia, unspecified: Secondary | ICD-10-CM | POA: Insufficient documentation

## 2018-04-08 DIAGNOSIS — Z823 Family history of stroke: Secondary | ICD-10-CM | POA: Insufficient documentation

## 2018-04-08 DIAGNOSIS — Z79899 Other long term (current) drug therapy: Secondary | ICD-10-CM | POA: Diagnosis not present

## 2018-04-08 DIAGNOSIS — I480 Paroxysmal atrial fibrillation: Secondary | ICD-10-CM | POA: Diagnosis not present

## 2018-04-08 DIAGNOSIS — Z7984 Long term (current) use of oral hypoglycemic drugs: Secondary | ICD-10-CM | POA: Diagnosis not present

## 2018-04-08 DIAGNOSIS — E1122 Type 2 diabetes mellitus with diabetic chronic kidney disease: Secondary | ICD-10-CM | POA: Diagnosis not present

## 2018-04-08 DIAGNOSIS — I5022 Chronic systolic (congestive) heart failure: Secondary | ICD-10-CM | POA: Diagnosis not present

## 2018-04-08 DIAGNOSIS — I4821 Permanent atrial fibrillation: Secondary | ICD-10-CM | POA: Diagnosis not present

## 2018-04-08 DIAGNOSIS — Z79891 Long term (current) use of opiate analgesic: Secondary | ICD-10-CM | POA: Diagnosis not present

## 2018-04-08 LAB — BASIC METABOLIC PANEL
ANION GAP: 9 (ref 5–15)
BUN: 26 mg/dL — ABNORMAL HIGH (ref 8–23)
CALCIUM: 9.3 mg/dL (ref 8.9–10.3)
CO2: 24 mmol/L (ref 22–32)
Chloride: 106 mmol/L (ref 98–111)
Creatinine, Ser: 1.46 mg/dL — ABNORMAL HIGH (ref 0.44–1.00)
GFR, EST AFRICAN AMERICAN: 37 mL/min — AB (ref >60)
GFR, EST NON AFRICAN AMERICAN: 32 mL/min — AB (ref >60)
GLUCOSE: 136 mg/dL — AB (ref 70–99)
POTASSIUM: 4.2 mmol/L (ref 3.5–5.1)
Sodium: 139 mmol/L (ref 135–145)

## 2018-04-08 NOTE — Patient Instructions (Signed)
Stop Digoxin.  Routine lab work today. Will notify you of abnormal results  Follow up in 6 months. Please call our office at [phone] in March 2020 to schedule your April 2020 appointment.

## 2018-04-09 NOTE — Progress Notes (Signed)
Patient ID: [name], female   DOB: 10-28-1931, 84 y.o.   MRN: [id number] PCP: Dr. Osborne Casco Cardiology: Dr. [name]  84 y.o. with paroxysmal atrial fibrillation, chronic systolic CHF, and CAD presents for cardiology followup. She apparently had been in persistent atrial fibrillation since around 8/16.  It appears that she was tachycardic most of the time prior to her recent admission in 7/17.  She had been on amiodarone prior to admission but it was not keeping her in NSR.  She was admitted with acute systolic CHF.  EF had dropped to 20-25% in 7/17 from 50-55% in 10/16.  She was in atrial fibrillation with RVR.  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  Amiodarone was stopped due to suspicion for amiodarone-induced lung toxicity (she was seen by pulmonary).  She was eventually cardioverted to NSR.  Ranolazine was started to try to keep her in NSR.  HR was low in the hospital so she has not been on a beta blocker.  She was extensively diuresed and discharged to SNF.  After discharge, she developed persistent nausea.  Eventually, it was found that the nausea was likely from ranolazine, so this medication was stopped and the nausea resolved.    Patient continued to have difficult-to-control atrial fibrillation.  GFR was too low for Tikosyn.  She therefore had AV nodal ablation with St Jude CRT-P device placed in 3/18.  She developed a pocket hematoma and had pocket revision with sub-pectoral placement in 5/18.   Echo in 9/19 showed EF up to 60-65%, moderate MR, moderate TR, PASP 43 mmHg.   Today she returns for followup of CHF.  She has been doing well overall.  Rare dyspnea. She can walk on flat ground without dyspnea.  She does house work without problems.  She still drives. No chest pain.  No orthopnea/PND.  No BRBPR/melena.   Labs (7/17): Digoxin 1.6 => 0.3, K 4.6, creatinine 0.87 => 0.76, HCT 34, BNP 265 Labs (9/17): K 5, creatinine 1.36 Labs  (11/17): K 4.9, creatinine 1.29, hgb 9.2, digoxin 0.5 Labs (12/17): hgb 10.4, digoxin 0.4 Labs (1/18): K 4, creatinine 1.62 => 1.49, hgb 9.5, LDL 42, HDL 63 Labs (3/18): K 4.1, creatinine 1.44, digoxin 0.4 Labs (6/18): K 3.9, creatinine 1.7, hgb 11.5, LDL 56, HDL 50 Labs (6/19): K 4.4, creatinine 1.7, hgb 12.8, LDL 54  ECG (personally reviewed): atrial fibrillation with BiV pacing  St Jude device interrogation: Thoracic impedance stable, 99% BiV pacing  PMH: 1. Atrial fibrillation: Paroxysmal.  Had been persistent since 8/16, was cardioverted to NSR during 7/17 admission.  Possible atypical atrial flutter on 12/17 ECG.  By 3/18, atrial fibrillation again.  - Nausea with ranolazine used for atrial fibrillation suppression.  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  - AV nodal ablation 3/18 with CRT-P.  2. 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  She had been in atrial fibrillation with elevated rate possibly for months prior to 7/17 admission.  - Echo (10/16): EF 50-55%.  - Echo (7/17): EF 20-25%, mildly decreased RV systolic function, severe TR, PA systolic pressure 64 mmHg.  - LHC/RHC (7/17): Patent OM stent, no obstructive CAD.  Mean RA 13, PA 45/25, mean PCWP 16, CI 2.36.  - Echo (1/18): EF 30-35% with diffuse hypokinesis, mildly decreased RV systolic function, moderate TR and MR, PASP 53 mmHg, severe LAE.  - St Jude CRT-P placed with AV nodal ablation in 3/18.  - Echo (9/19): EF 60-65%, moderate LVH, grade 3 diastolic  dysfunction, mild MR, mild AI, moderate TR, PASP 43 mmHg.  3. CAD: DES to OM in 2007.  LHC (7/17) with patent stent and no new obstructive disease.  4. Type II diabetes 5. Hyperlipidemia 6. TIA 7. Possible amiodarone lung toxicity: Chest CT (7/17) with bronchiectasis/fibrosis, patchy bilateral densities.   Social History   Socioeconomic History  . Marital status: Married    Spouse name: Not on file  . Number of  children: Not on file  . Years of education: Not on file  . Highest education level: Not on file  Occupational History  . Not on file  Social Needs  . Financial resource strain: Not on file  . Food insecurity:    Worry: Not on file    Inability: Not on file  . Transportation needs:    Medical: Not on file    Non-medical: Not on file  Tobacco Use  . Smoking status: Former Smoker    Packs/day: 1.00    Types: Cigarettes    Last attempt to quit: 07/03/1963    Years since quitting: 54.8  . Smokeless tobacco: Never Used  Substance and Sexual Activity  . Alcohol use: No  . Drug use: No  . Sexual activity: Never  Lifestyle  . Physical activity:    Days per week: Not on file    Minutes per session: Not on file  . Stress: Not on file  Relationships  . Social connections:    Talks on phone: Not on file    Gets together: Not on file    Attends religious service: Not on file    Active member of club or organization: Not on file    Attends meetings of clubs or organizations: Not on file    Relationship status: Not on file  . Intimate partner violence:    Fear of current or ex partner: Not on file    Emotionally abused: Not on file    Physically abused: Not on file    Forced sexual activity: Not on file  Other Topics Concern  . Not on file  Social History Narrative   Lives in [name] with husband.  Does not routinely exercise but is able to keep house and go food shopping.   Family History  Problem Relation Age of Onset  . Heart attack Mother   . Stroke Mother   . Heart attack Father   . Heart attack Brother   . Coronary artery disease Sister        CABG  . Alzheimer's disease Sister        X3  . Stroke Sister   . Heart attack Sister    ROS: All systems reviewed and negative except as per HPI.   Current Outpatient Medications  Medication Sig Dispense Refill  . ACCU-CHEK FASTCLIX LANCETS MISC USE TO CHECK BLOOD SUGAR TWICE DAILY  5  . ACCU-CHEK SMARTVIEW test strip     .  apixaban (ELIQUIS) 2.5 MG TABS tablet Take 1 tablet (2.5 mg total) by mouth 2 (two) times daily. 180 tablet 3  . atorvastatin (LIPITOR) 20 MG tablet TAKE 1 TABLET BY MOUTH EVERY DAY 90 tablet 2  . bisoprolol (ZEBETA) 5 MG tablet Take 1.5 tablets (7.5 mg total) by mouth daily. Please cancel all previous orders for current medication. Change in dosage or pill size. 45 tablet 3  . Cholecalciferol (VITAMIN D) 2000 units tablet Take 2,000 Units by mouth daily.    . furosemide (LASIX) 20 MG tablet TAKE 20 MG (  1 TAB) ONCE DAILY ALTERNATING WITH 40 MG (2 TABS) THE NEXT DAY CONTINUOUSLY. 270 tablet 0  . HYDROcodone-acetaminophen (NORCO/VICODIN) 5-325 MG tablet Take 1 tablet by mouth daily as needed for pain.    . metFORMIN (GLUCOPHAGE) 500 MG tablet Take 500 mg by mouth daily with supper.     . nitroGLYCERIN (NITROSTAT) 0.4 MG SL tablet TAKE 1 TABLET BY MOUTH UNDER THE TONGUE EVERY 5 MINUTES FOR 3 DOSES AS NEEDED FOR CHEST PAIN 25 tablet 2  . spironolactone (ALDACTONE) 25 MG tablet TAKE 1 TABLET BY MOUTH EVERY DAY 90 tablet 1  . vitamin C (ASCORBIC ACID) 500 MG tablet Take 500 mg by mouth daily.     No current facility-administered medications for this encounter.    BP 129/75   Pulse (!) 52   Wt 56.9 kg (125 lb 6.4 oz)   SpO2 100%   BMI 22.94 kg/m  General: NAD Neck: No JVD, no thyromegaly or thyroid nodule.  Lungs: Clear to auscultation bilaterally with normal respiratory effort. CV: Nondisplaced PMI.  Heart regular S1/S2, no S3/S4, no murmur. Trace ankle edema. No carotid bruit.  Normal pedal pulses.  Abdomen: Soft, nontender, no hepatosplenomegaly, no distention.  Skin: Intact without lesions or rashes.  Neurologic: Alert and oriented x 3.  Psych: Normal affect. Extremities: No clubbing or cyanosis.  HEENT: Normal.   Assessment/Plan: 1. Chronic systolic CHF: EF 57-32% on 7/17 echo, 30-35% on 1/18 echo.  Nonischemic cardiomyopathy, possible role for tachycardia-mediated cardiomyopathy (some  improvement in EF with rate control but not back to normal). ECHO 03/2017 EF 40-45%  She had AV nodal ablation due to difficult-to-control atrial fibrillation in 3/18 with St Jude CRT-P placement. Repeat echo in 9/19 showed EF up to 60-65%.  NYHA class II symptoms, she is not volume overloaded on exam or by [facility].   - Continue Lasix 40 mg daily alternating with 20 mg daily.  - With recovery of EF, she can stop digoxin.  - Unable to tolerate losartan at even low dose.  - Continue bisoprolol 7.5 mg daily.  - Continue spironolactone 25 mg daily.  - Check BMET today.  2. Atrial fibrillation/flutter:  Concern for tachy-mediated cardiomyopathy.  She had suspected amiodarone-induced lung toxicity so ranolazine was started.  She did not tolerate ranolazine due to nausea. Poor Tikosyn candidate with CKD.   She tolerated atrial fibrillation poorly and rate was difficult to control, so she had AV nodal ablation with St Jude CRT-P device.  She seems to be doing better since the ablation.  - Continue apixaban 2.5 mg bid.  3. CAD: Patent OM stent on coronary angiography in 7/17.  No chest pain.  - Continue statin, good lipids in 6/19.  - No ASA with warfarin use.    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  Off amiodarone.  Follows with pulmonary.   Follow up in 6 months .   [name] 04/09/2018

## 2018-04-09 NOTE — Progress Notes (Signed)
Remote pacemaker transmission.   

## 2018-04-17 ENCOUNTER — Encounter: Payer: Self-pay | Admitting: Cardiology

## 2018-04-21 ENCOUNTER — Ambulatory Visit: Payer: PPO | Admitting: Cardiovascular Disease

## 2018-05-06 ENCOUNTER — Other Ambulatory Visit (HOSPITAL_COMMUNITY): Payer: Self-pay | Admitting: Cardiology

## 2018-06-18 DIAGNOSIS — Z23 Encounter for immunization: Secondary | ICD-10-CM | POA: Diagnosis not present

## 2018-06-18 DIAGNOSIS — I2510 Atherosclerotic heart disease of native coronary artery without angina pectoris: Secondary | ICD-10-CM | POA: Diagnosis not present

## 2018-06-18 DIAGNOSIS — Z7901 Long term (current) use of anticoagulants: Secondary | ICD-10-CM | POA: Diagnosis not present

## 2018-06-18 DIAGNOSIS — G4733 Obstructive sleep apnea (adult) (pediatric): Secondary | ICD-10-CM | POA: Diagnosis not present

## 2018-06-18 DIAGNOSIS — I1310 Hypertensive heart and chronic kidney disease without heart failure, with stage 1 through stage 4 chronic kidney disease, or unspecified chronic kidney disease: Secondary | ICD-10-CM | POA: Diagnosis not present

## 2018-06-18 DIAGNOSIS — E1151 Type 2 diabetes mellitus with diabetic peripheral angiopathy without gangrene: Secondary | ICD-10-CM | POA: Diagnosis not present

## 2018-06-18 DIAGNOSIS — I10 Essential (primary) hypertension: Secondary | ICD-10-CM | POA: Diagnosis not present

## 2018-06-18 DIAGNOSIS — E1140 Type 2 diabetes mellitus with diabetic neuropathy, unspecified: Secondary | ICD-10-CM | POA: Diagnosis not present

## 2018-06-18 DIAGNOSIS — Z6822 Body mass index (BMI) 22.0-22.9, adult: Secondary | ICD-10-CM | POA: Diagnosis not present

## 2018-06-18 DIAGNOSIS — I509 Heart failure, unspecified: Secondary | ICD-10-CM | POA: Diagnosis not present

## 2018-06-18 DIAGNOSIS — I208 Other forms of angina pectoris: Secondary | ICD-10-CM | POA: Diagnosis not present

## 2018-06-18 DIAGNOSIS — D638 Anemia in other chronic diseases classified elsewhere: Secondary | ICD-10-CM | POA: Diagnosis not present

## 2018-06-18 DIAGNOSIS — E7800 Pure hypercholesterolemia, unspecified: Secondary | ICD-10-CM | POA: Diagnosis not present

## 2018-06-27 LAB — CUP PACEART REMOTE DEVICE CHECK
Date Time Interrogation Session: 20191227164033
Implantable Lead Implant Date: 20180329
Implantable Lead Location: 753860
Implantable Pulse Generator Implant Date: 20180329
MDC IDC LEAD IMPLANT DT: 20180329
MDC IDC LEAD IMPLANT DT: 20180329
MDC IDC LEAD LOCATION: 753859
MDC IDC LEAD LOCATION: 753860
Pulse Gen Model: 3262
Pulse Gen Serial Number: 8006933

## 2018-07-08 ENCOUNTER — Ambulatory Visit (INDEPENDENT_AMBULATORY_CARE_PROVIDER_SITE_OTHER): Payer: PPO

## 2018-07-08 DIAGNOSIS — I428 Other cardiomyopathies: Secondary | ICD-10-CM

## 2018-07-08 DIAGNOSIS — I442 Atrioventricular block, complete: Secondary | ICD-10-CM

## 2018-07-09 NOTE — Progress Notes (Signed)
Remote pacemaker transmission.   

## 2018-07-10 LAB — CUP PACEART REMOTE DEVICE CHECK
Date Time Interrogation Session: 20200109061808
Implantable Lead Implant Date: 20180329
Implantable Lead Implant Date: 20180329
Implantable Lead Location: 753859
Implantable Lead Location: 753860
Implantable Lead Location: 753860
Implantable Lead Model: 5076
MDC IDC LEAD IMPLANT DT: 20180329
MDC IDC PG IMPLANT DT: 20180329
Pulse Gen Model: 3262
Pulse Gen Serial Number: 8006933

## 2018-07-15 ENCOUNTER — Ambulatory Visit: Payer: PPO | Admitting: Cardiovascular Disease

## 2018-07-15 ENCOUNTER — Encounter: Payer: Self-pay | Admitting: Cardiovascular Disease

## 2018-07-15 VITALS — BP 108/72 | HR 60 | Ht 64.0 in | Wt 128.0 lb

## 2018-07-15 DIAGNOSIS — I2510 Atherosclerotic heart disease of native coronary artery without angina pectoris: Secondary | ICD-10-CM

## 2018-07-15 DIAGNOSIS — I5022 Chronic systolic (congestive) heart failure: Secondary | ICD-10-CM | POA: Diagnosis not present

## 2018-07-15 DIAGNOSIS — I4821 Permanent atrial fibrillation: Secondary | ICD-10-CM | POA: Diagnosis not present

## 2018-07-15 NOTE — Progress Notes (Signed)
Cardiology Office Note   Date:  07/15/2018   ID:  Shannon [name], DOB 1931-08-11, MRN [id number]  PCP:  Haywood Pao, MD  Cardiologist:   Mertie Moores, MD   Chief Complaint  Patient presents with  . Coronary Artery Disease   1. Coronary artery disease: Status post PTCA and stenting of the first obtuse marginal-05/13/2006. We placed a 3.0 x 20 mm Taxus stent. It was post dilated using a 3.25 mm noncompliant balloon up to 14 atmospheres 2. Diabetes mellitus 3. Hyperlipidemia 4. Paroxysmal Atrial fib    Shannon [name] is an 85 y.o. female with a hx of CAD. She has some occasional episodes of chest pressure typically when she wakes up in the morning. She also wakes up with a severe headache. She's been worked up for sleep apnea. She works out in the yard on a fairly regular basis and does not have any chest pain or chest tightness with exercise.   She is active all day long. She gets tired but never has any chest pain.  Feb. 12, 2015:  Shannon [name] is doing well. She was in the hosptial recently with Atrial fib and atrial flutter. She had an EP study but was not able to be ablated. She was cardioverted and has maintained NSR. She is able to do all of her normal activities. She is tolerating her pradaxa . Sometimes she forgets  February 15, 2014:  Shannon [name] is doing well. No CP. She had had some bloody mucus and had some sinus problems. Likely due to the pradaxa. No significant arrhthymias    September 20, 2014: Shannon [name] is a 85 y.o. female who presents for follow up of her CAD She is upset about her husband, [name]. He fell last August and has been bedridden since that time.  Has not been able to get him to his pacer appts.   No CP or dyspnea.  She has occasional nose bleeds  - likely due to the Pradaxa   Oct. 4, 2016: Shannon [name] is seen today for evaluation of rapid atrial fib.  She has paroxysmal atrial fib - has been in atrial fib off and of  for the past  12 days   Frequently takes extra metoprolol to help keep her  HR slower  Has had excessive fatigue, leg swelling   Oct. 11, 2016 Shannon [name] is back for follow up Was started on amiodarone. She's had some nausea and lack of appetite since starting the amiodarone. She has converted to sinus rhythm. She is feeling better now that she is in normal sinus rhythm. She's had a history of paroxysmal atrial fibrillation. She has seen Dr. Rayann Heman in the past. They discussed A. fib ablation but Dr. Rayann Heman decided that she was not a good candidate for ablation.  Jan. 20, 2017:  Doing ok.  No dizziness, no dyspnea, no CP   Dec 27, 2015:  Complains of being very weak Has had GI bleeding on pradaxa.  Was hospitalized Now is on coumadin  Going to our coumadin clinic  Has developed a cough.   Looks pale today .   Is on Vit D and iron tablets Has not had any recent bleeding .   Has a poor appetite.     Was so weak that she could not weight herself.   Apr 01, 2017   Shannon [name] is seen back after 1 1/2 year. No angina Seems to be doing well  Had a pacer ( St. Jude CRT)  placed in March ,  2018 LV function has improved significantly after the CRT. EF 40-45%.  Now is seeing [name] in the CHF clinic, [name] in EP.   July 15, 2018: Shannon [name] is seen back today for her yearly visit.  She has a CRT pacemaker.  She feels quite a bit better after her CRT pacer.  No CP or dyspnea.  Sees [facility] every 6 months . EF has normalized.  Her husband , [name] has been in the SNF for several years. Is now paralized.    Past Medical History:  Diagnosis Date  . Arthritis   . Baker's cyst of knee   . CAD (coronary artery disease)    a. 05/2006 Cath/PCI: LAD 2m, D1 40 ost, LCX nl, OM1 95 (3.0x20 Taxus DES), RCA nl, EF 40% w/ lat AK;  04/2007 Ex MV: minimal lat ischemia in area of prior infarct->low risk-> med Rx.  . Chronic atrial fibrillation    a. on Coumadin  . Chronic systolic (congestive) heart  failure ([place])    a. 04/2015: EF 50-55% b. echo 12/2015: EF 20-25% w/ diffuse HK, biatrial enlargement, mild AI and MR, severe TR    . Diabetes mellitus   . Hyperlipidemia   . MVP (mitral valve prolapse)    a. 10/2003 Echo: nl LV fxn, mild MR/TR/AS  . Myocardial infarction ([place])   . Sleep apnea   . Transient ischemic attack     Past Surgical History:  Procedure Laterality Date  . ATRIAL FLUTTER ABLATION N/A 10/09/2012   Procedure: ATRIAL FLUTTER ABLATION;  Surgeon: Thompson Grayer, MD;  Location: [facility] CATH LAB;  Service: Cardiovascular;  Laterality: N/A;  . AV NODE ABLATION N/A 09/27/2016   Procedure: AV Node Ablation;  Surgeon: Thompson Grayer, MD;  Location: [place] CV LAB;  Service: Cardiovascular;  Laterality: N/A;  . BIV PACEMAKER INSERTION CRT-P N/A 09/27/2016   Procedure: BiV Pacemaker Insertion CRT-P;  Surgeon: Thompson Grayer, MD;  Location: [place] CV LAB;  Service: Cardiovascular;  Laterality: N/A;  . CARDIAC CATHETERIZATION N/A 01/06/2016   Procedure: Right/Left Heart Cath and Coronary Angiography;  Surgeon: Belva Crome, MD;  Location: [place] CV LAB;  Service: Cardiovascular;  Laterality: N/A;  . CARDIOVERSION N/A 01/11/2016   Procedure: CARDIOVERSION;  Surgeon: Larey Dresser, MD;  Location[facility] ENDOSCOPY;  Service: Cardiovascular;  Laterality: N/A;  . cataracts    . COLONOSCOPY N/A 10/13/2015   Procedure: COLONOSCOPY;  Surgeon: Manus Gunning, MD;  Location: [facility] ENDOSCOPY;  Service: Gastroenterology;  Laterality: N/A;  . CORONARY ANGIOPLASTY     Status post PTCA and stenting of the first obtuse marginal-05/13/2006. We placed a 3.0 x 20 mm Taxus stent. It was post dilated using a 3.25 mm noncompliant balloon up to 14 atmospheres  . IMPLANT POCKET INCISION & DRAINAGE N/A 11/02/2016   Procedure: Implant Pocket Incision & Drainage;  Surgeon: Evans Lance, MD;  Location: [place] CV LAB;  Service: Cardiovascular;  Laterality: N/A;  . TONSILLECTOMY       Current  Outpatient Medications  Medication Sig Dispense Refill  . ACCU-CHEK FASTCLIX LANCETS MISC USE TO CHECK BLOOD SUGAR TWICE DAILY  5  . ACCU-CHEK SMARTVIEW test strip     . atorvastatin (LIPITOR) 20 MG tablet TAKE 1 TABLET BY MOUTH EVERY DAY 90 tablet 2  . bisoprolol (ZEBETA) 5 MG tablet Take 1.5 tablets (7.5 mg total) by mouth daily. Please cancel all previous orders for current medication. Change in dosage or pill size. 45 tablet 3  . Cholecalciferol (VITAMIN  D) 2000 units tablet Take 2,000 Units by mouth daily.    [name] ELIQUIS 2.5 MG TABS tablet TAKE 1 TABLET BY MOUTH TWICE A DAY 180 tablet 3  . furosemide (LASIX) 20 MG tablet TAKE 20 MG (1 TAB) ONCE DAILY ALTERNATING WITH 40 MG (2 TABS) THE NEXT DAY CONTINUOUSLY. 270 tablet 0  . HYDROcodone-acetaminophen (NORCO/VICODIN) 5-325 MG tablet Take 1 tablet by mouth daily as needed for pain.    . metFORMIN (GLUCOPHAGE) 500 MG tablet Take 500 mg by mouth daily with supper.     . nitroGLYCERIN (NITROSTAT) 0.4 MG SL tablet TAKE 1 TABLET BY MOUTH UNDER THE TONGUE EVERY 5 MINUTES FOR 3 DOSES AS NEEDED FOR CHEST PAIN 25 tablet 2  . spironolactone (ALDACTONE) 25 MG tablet TAKE 1 TABLET BY MOUTH EVERY DAY 90 tablet 1  . vitamin C (ASCORBIC ACID) 500 MG tablet Take 500 mg by mouth daily.     No current facility-administered medications for this visit.     Allergies:   Amiodarone; Ranexa [ranolazine]; Other; Demerol; Erythromycin; Penicillins; and Percocet [oxycodone-acetaminophen]    Social History:  The patient  reports that she quit smoking about 55 years ago. Her smoking use included cigarettes. She smoked 1.00 pack per day. She has never used smokeless tobacco. She reports that she does not drink alcohol or use drugs.   Family History:  The patient's family history includes Alzheimer's disease in her sister; Coronary artery disease in her sister; Heart attack in her brother, father, mother, and sister; Stroke in her mother and sister.    ROS:  Please see  the history of present illness.       Physical Exam: Blood pressure 108/72, pulse 60, height 5\' 4"  (1.626 m), weight 128 lb (58.1 kg).  GEN:  Well nourished, well developed in no acute distress HEENT: Normal NECK: No JVD; No carotid bruits LYMPHATICS: No lymphadenopathy CARDIAC: RRR  RESPIRATORY:  Clear to auscultation without rales, wheezing or rhonchi  ABDOMEN: Soft, non-tender, non-distended MUSCULOSKELETAL:  No edema; No deformity  SKIN: Warm and dry NEUROLOGIC:  Alert and oriented x 3   EKG:    July 15, 2018: Atrial fibrillation at a rate of 60.  Intermittent ventricular pacing.   Recent Labs: 09/26/2017: Hemoglobin 11.8; Platelets 168 04/08/2018: BUN 26; Creatinine, Ser 1.46; Potassium 4.2; Sodium 139    Lipid Panel    Component Value Date/Time   CHOL 119 07/24/2016 1526   TRIG 70 07/24/2016 1526   HDL 63 07/24/2016 1526   CHOLHDL 1.9 07/24/2016 1526   VLDL 14 07/24/2016 1526   LDLCALC 42 07/24/2016 1526      Wt Readings from Last 3 Encounters:  07/15/18 128 lb (58.1 kg)  04/08/18 125 lb 6.4 oz (56.9 kg)  12/05/17 124 lb (56.2 kg)      Other studies Reviewed: Additional studies/ records that were reviewed today include: . Review of the above records demonstrates:    ASSESSMENT AND PLAN:  1. Coronary artery disease:   Doing well.  2. Chronic systolic congestive heart failure:    EF has normalized withAV node ablation and CRT pacing   3. Hyperlipidemia- lipids look great.  Managed by tisovc  4.  Atrial fib- .  Has chronic atrial fibrillation.  She is now had an AV node ablation and has a pacemaker.   The following changes have been made:  no change  Labs/ tests ordered today include:  No orders of the defined types were placed in this encounter.  Disposition:   FU with me in 1 year    Signed, Mertie Moores, MD  07/15/2018 1:39 PM    [facility] [address], [place], [place]  [address] Phone: [phone];  Fax: [phone]

## 2018-07-15 NOTE — Patient Instructions (Signed)

## 2018-09-10 ENCOUNTER — Other Ambulatory Visit: Payer: Self-pay | Admitting: Internal Medicine

## 2018-09-24 ENCOUNTER — Other Ambulatory Visit: Payer: Self-pay | Admitting: Cardiovascular Disease

## 2018-10-03 ENCOUNTER — Other Ambulatory Visit (HOSPITAL_COMMUNITY): Payer: Self-pay

## 2018-10-03 MED ORDER — SPIRONOLACTONE 25 MG PO TABS: 25.0000 mg | ORAL_TABLET | Freq: Every day | ORAL | 1 refills | Status: DC

## 2018-10-07 ENCOUNTER — Other Ambulatory Visit: Payer: Self-pay

## 2018-10-07 ENCOUNTER — Ambulatory Visit (INDEPENDENT_AMBULATORY_CARE_PROVIDER_SITE_OTHER): Payer: PPO | Admitting: *Deleted

## 2018-10-07 DIAGNOSIS — H40053 Ocular hypertension, bilateral: Secondary | ICD-10-CM | POA: Diagnosis not present

## 2018-10-07 DIAGNOSIS — I442 Atrioventricular block, complete: Secondary | ICD-10-CM

## 2018-10-07 DIAGNOSIS — S299XXA Unspecified injury of thorax, initial encounter: Secondary | ICD-10-CM | POA: Diagnosis not present

## 2018-10-07 DIAGNOSIS — E119 Type 2 diabetes mellitus without complications: Secondary | ICD-10-CM | POA: Diagnosis not present

## 2018-10-07 DIAGNOSIS — Z961 Presence of intraocular lens: Secondary | ICD-10-CM | POA: Diagnosis not present

## 2018-10-07 DIAGNOSIS — I4821 Permanent atrial fibrillation: Secondary | ICD-10-CM

## 2018-10-07 DIAGNOSIS — H26491 Other secondary cataract, right eye: Secondary | ICD-10-CM | POA: Diagnosis not present

## 2018-10-07 LAB — CUP PACEART REMOTE DEVICE CHECK
Battery Remaining Longevity: 55 mo
Battery Remaining Percentage: 95.5 %
Battery Voltage: 2.96 V
Date Time Interrogation Session: 20200407060016
Implantable Lead Implant Date: 20180329
Implantable Lead Implant Date: 20180329
Implantable Lead Implant Date: 20180329
Implantable Lead Location: 753859
Implantable Lead Location: 753860
Implantable Lead Location: 753860
Implantable Lead Model: 3830
Implantable Lead Model: 5076
Implantable Pulse Generator Implant Date: 20180329
Lead Channel Impedance Value: 430 Ohm
Lead Channel Impedance Value: 450 Ohm
Lead Channel Pacing Threshold Amplitude: 0.75 V
Lead Channel Pacing Threshold Amplitude: 2 V
Lead Channel Pacing Threshold Pulse Width: 0.5 ms
Lead Channel Pacing Threshold Pulse Width: 1 ms
Lead Channel Sensing Intrinsic Amplitude: 5.2 mV
Lead Channel Setting Pacing Amplitude: 2 V
Lead Channel Setting Pacing Amplitude: 3 V
Lead Channel Setting Pacing Pulse Width: 0.5 ms
Lead Channel Setting Pacing Pulse Width: 1 ms
Lead Channel Setting Sensing Sensitivity: 4 mV
Pulse Gen Model: 3222
Pulse Gen Serial Number: 8006933

## 2018-10-08 ENCOUNTER — Other Ambulatory Visit: Payer: Self-pay | Admitting: Internal Medicine

## 2018-10-15 NOTE — Progress Notes (Signed)
Remote pacemaker transmission.   

## 2018-10-17 ENCOUNTER — Other Ambulatory Visit (HOSPITAL_COMMUNITY): Payer: Self-pay | Admitting: Cardiology

## 2018-10-17 MED ORDER — BISOPROLOL FUMARATE 5 MG PO TABS: 7.5000 mg | ORAL_TABLET | Freq: Every day | ORAL | 3 refills | Status: DC

## 2018-10-21 ENCOUNTER — Other Ambulatory Visit (HOSPITAL_COMMUNITY): Payer: Self-pay

## 2018-10-21 NOTE — Telephone Encounter (Signed)
error 

## 2018-12-05 ENCOUNTER — Other Ambulatory Visit: Payer: Self-pay | Admitting: Internal Medicine

## 2018-12-18 DIAGNOSIS — E1151 Type 2 diabetes mellitus with diabetic peripheral angiopathy without gangrene: Secondary | ICD-10-CM | POA: Diagnosis not present

## 2018-12-18 DIAGNOSIS — E559 Vitamin D deficiency, unspecified: Secondary | ICD-10-CM | POA: Diagnosis not present

## 2018-12-18 DIAGNOSIS — N183 Chronic kidney disease, stage 3 (moderate): Secondary | ICD-10-CM | POA: Diagnosis not present

## 2018-12-18 DIAGNOSIS — E7800 Pure hypercholesterolemia, unspecified: Secondary | ICD-10-CM | POA: Diagnosis not present

## 2018-12-25 DIAGNOSIS — I739 Peripheral vascular disease, unspecified: Secondary | ICD-10-CM | POA: Diagnosis not present

## 2018-12-25 DIAGNOSIS — Z1331 Encounter for screening for depression: Secondary | ICD-10-CM | POA: Diagnosis not present

## 2018-12-25 DIAGNOSIS — I4821 Permanent atrial fibrillation: Secondary | ICD-10-CM | POA: Diagnosis not present

## 2018-12-25 DIAGNOSIS — I1310 Hypertensive heart and chronic kidney disease without heart failure, with stage 1 through stage 4 chronic kidney disease, or unspecified chronic kidney disease: Secondary | ICD-10-CM | POA: Diagnosis not present

## 2018-12-25 DIAGNOSIS — Z0000 Encounter for general adult medical examination without abnormal findings: Secondary | ICD-10-CM | POA: Diagnosis not present

## 2018-12-25 DIAGNOSIS — Z1339 Encounter for screening examination for other mental health and behavioral disorders: Secondary | ICD-10-CM | POA: Diagnosis not present

## 2018-12-25 DIAGNOSIS — I509 Heart failure, unspecified: Secondary | ICD-10-CM | POA: Diagnosis not present

## 2018-12-25 DIAGNOSIS — Z7901 Long term (current) use of anticoagulants: Secondary | ICD-10-CM | POA: Diagnosis not present

## 2018-12-25 DIAGNOSIS — I8310 Varicose veins of unspecified lower extremity with inflammation: Secondary | ICD-10-CM | POA: Diagnosis not present

## 2018-12-25 DIAGNOSIS — N183 Chronic kidney disease, stage 3 (moderate): Secondary | ICD-10-CM | POA: Diagnosis not present

## 2018-12-25 DIAGNOSIS — E1151 Type 2 diabetes mellitus with diabetic peripheral angiopathy without gangrene: Secondary | ICD-10-CM | POA: Diagnosis not present

## 2018-12-25 DIAGNOSIS — I209 Angina pectoris, unspecified: Secondary | ICD-10-CM | POA: Diagnosis not present

## 2018-12-25 DIAGNOSIS — M5136 Other intervertebral disc degeneration, lumbar region: Secondary | ICD-10-CM | POA: Diagnosis not present

## 2018-12-25 DIAGNOSIS — E1140 Type 2 diabetes mellitus with diabetic neuropathy, unspecified: Secondary | ICD-10-CM | POA: Diagnosis not present

## 2019-01-06 ENCOUNTER — Ambulatory Visit (INDEPENDENT_AMBULATORY_CARE_PROVIDER_SITE_OTHER): Payer: PPO | Admitting: *Deleted

## 2019-01-06 DIAGNOSIS — I442 Atrioventricular block, complete: Secondary | ICD-10-CM

## 2019-01-06 LAB — CUP PACEART REMOTE DEVICE CHECK
Battery Remaining Longevity: 53 mo
Battery Remaining Percentage: 95.5 %
Battery Voltage: 2.96 V
Date Time Interrogation Session: 20200707060015
Implantable Lead Implant Date: 20180329
Implantable Lead Implant Date: 20180329
Implantable Lead Implant Date: 20180329
Implantable Lead Location: 753859
Implantable Lead Location: 753860
Implantable Lead Location: 753860
Implantable Lead Model: 3830
Implantable Lead Model: 5076
Implantable Pulse Generator Implant Date: 20180329
Lead Channel Impedance Value: 380 Ohm
Lead Channel Impedance Value: 410 Ohm
Lead Channel Pacing Threshold Amplitude: 0.75 V
Lead Channel Pacing Threshold Amplitude: 2 V
Lead Channel Pacing Threshold Pulse Width: 0.5 ms
Lead Channel Pacing Threshold Pulse Width: 1 ms
Lead Channel Sensing Intrinsic Amplitude: 5.6 mV
Lead Channel Setting Pacing Amplitude: 2 V
Lead Channel Setting Pacing Amplitude: 3 V
Lead Channel Setting Pacing Pulse Width: 0.5 ms
Lead Channel Setting Pacing Pulse Width: 1 ms
Lead Channel Setting Sensing Sensitivity: 4 mV
Pulse Gen Model: 3222
Pulse Gen Serial Number: 8006933

## 2019-01-12 ENCOUNTER — Other Ambulatory Visit (HOSPITAL_COMMUNITY): Payer: Self-pay | Admitting: Cardiology

## 2019-01-18 ENCOUNTER — Encounter: Payer: Self-pay | Admitting: Cardiology

## 2019-01-18 NOTE — Progress Notes (Signed)
Remote pacemaker transmission.   

## 2019-03-16 ENCOUNTER — Other Ambulatory Visit: Payer: Self-pay | Admitting: Cardiovascular Disease

## 2019-04-03 ENCOUNTER — Other Ambulatory Visit (HOSPITAL_COMMUNITY): Payer: Self-pay | Admitting: Cardiology

## 2019-04-07 ENCOUNTER — Ambulatory Visit (INDEPENDENT_AMBULATORY_CARE_PROVIDER_SITE_OTHER): Payer: PPO | Admitting: *Deleted

## 2019-04-07 DIAGNOSIS — I442 Atrioventricular block, complete: Secondary | ICD-10-CM | POA: Diagnosis not present

## 2019-04-07 DIAGNOSIS — I480 Paroxysmal atrial fibrillation: Secondary | ICD-10-CM

## 2019-04-08 LAB — CUP PACEART REMOTE DEVICE CHECK
Battery Remaining Longevity: 53 mo
Battery Remaining Percentage: 95.5 %
Battery Voltage: 2.96 V
Date Time Interrogation Session: 20201006060026
Implantable Lead Implant Date: 20180329
Implantable Lead Implant Date: 20180329
Implantable Lead Implant Date: 20180329
Implantable Lead Location: 753859
Implantable Lead Location: 753860
Implantable Lead Location: 753860
Implantable Lead Model: 3830
Implantable Lead Model: 5076
Implantable Pulse Generator Implant Date: 20180329
Lead Channel Impedance Value: 380 Ohm
Lead Channel Impedance Value: 410 Ohm
Lead Channel Pacing Threshold Amplitude: 0.75 V
Lead Channel Pacing Threshold Amplitude: 2 V
Lead Channel Pacing Threshold Pulse Width: 0.5 ms
Lead Channel Pacing Threshold Pulse Width: 1 ms
Lead Channel Sensing Intrinsic Amplitude: 4.6 mV
Lead Channel Setting Pacing Amplitude: 2 V
Lead Channel Setting Pacing Amplitude: 3 V
Lead Channel Setting Pacing Pulse Width: 0.5 ms
Lead Channel Setting Pacing Pulse Width: 1 ms
Lead Channel Setting Sensing Sensitivity: 4 mV
Pulse Gen Model: 3222
Pulse Gen Serial Number: 8006933

## 2019-04-17 ENCOUNTER — Encounter (HOSPITAL_COMMUNITY): Payer: Self-pay

## 2019-04-17 ENCOUNTER — Ambulatory Visit (HOSPITAL_COMMUNITY)
Admission: RE | Admit: 2019-04-17 | Discharge: 2019-04-17 | Disposition: A | Discharge status: Home / Self Care | Payer: PPO | Source: Ambulatory Visit | Attending: Cardiology | Admitting: Cardiology

## 2019-04-17 DIAGNOSIS — I5022 Chronic systolic (congestive) heart failure: Secondary | ICD-10-CM

## 2019-04-17 NOTE — Progress Notes (Signed)
Remote pacemaker transmission.   

## 2019-04-17 NOTE — Patient Instructions (Signed)
Labs Tuesday 04/28/19 at 1:45pm We will only contact you if something comes back abnormal or we need to make some changes. Otherwise no news is good news!  Your physician has requested that you have an echocardiogram. Echocardiography is a painless test that uses sound waves to create images of your heart. It provides your doctor with information about the size and shape of your heart and how well your heart's chambers and valves are working. This procedure takes approximately one hour. There are no restrictions for this procedure.  Your physician recommends that you schedule a follow-up appointment in: 6 months with Dr [name]  At the [facility], you and your health needs are our priority. As part of our continuing mission to provide you with exceptional heart care, we have created designated Provider Care Teams. These Care Teams include your primary Cardiologist (physician) and Advanced Practice Providers (APPs- Physician Assistants and Nurse Practitioners) who all work together to provide you with the care you need, when you need it.   You may see any of the following providers on your designated Care Team at your next follow up: [name] Dr Glori Bickers . Dr [name] . Darrick Grinder, NP . Lyda Jester, PA   Please be sure to bring in all your medications bottles to every appointment.

## 2019-04-17 NOTE — Progress Notes (Signed)
Called patient with AVS. Advised lab and echo needed. appts made. Verbalized understanding. Summary sent via mychart

## 2019-04-19 NOTE — Progress Notes (Signed)
Heart Failure TeleHealth Note  Due to national recommendations of social distancing due to [place] 19, Audio/video telehealth visit is felt to be most appropriate for this patient at this time.  See MyChart message from today for patient consent regarding telehealth for [facility].  Date:  04/19/2019   ID:  Shannon [name], DOB 08-16-1931, MRN [id number]  Location: Home  Provider location: [place] [facility] Failure Type of Visit: Established patient   PCP:  Tisovec, Fransico Him, MD  Cardiologist:  Mertie Moores, MD Primary HF: Dr. [name]  Chief Complaint: Shortness of breath   History of Present Illness: Shannon [name] is a 85 y.o. female who presents via audio/video conferencing for a telehealth visit today.     she denies symptoms worrisome for COVID 19.   Patient has a history of paroxysmal atrial fibrillation, chronic systolic CHF, and CAD. She apparently had been in persistent atrial fibrillation since around 8/16.  It appears that she was tachycardic most of the time prior to her recent admission in 7/17.  She had been on amiodarone prior to admission but it was not keeping her in NSR.  She was admitted with acute systolic CHF.  EF had dropped to 20-25% in 7/17 from 50-55% in 10/16.  She was in atrial fibrillation with RVR.  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  Amiodarone was stopped due to suspicion for amiodarone-induced lung toxicity (she was seen by pulmonary).  She was eventually cardioverted to NSR.  Ranolazine was started to try to keep her in NSR.  HR was low in the hospital so she has not been on a beta blocker.  She was extensively diuresed and discharged to SNF.  After discharge, she developed persistent nausea.  Eventually, it was found that the nausea was likely from ranolazine, so this medication was stopped and the nausea resolved.    Patient continued to have difficult-to-control atrial fibrillation.   GFR was too low for Tikosyn.  She therefore had AV nodal ablation with St Jude CRT-P device placed in 3/18.  She developed a pocket hematoma and had pocket revision with sub-pectoral placement in 5/18.   Echo in 9/19 showed EF up to 60-65%, moderate MR, moderate TR, PASP 43 mmHg.   She is doing well now.  Has not been out much because of COVID-19.  Mild dypsnea with sweeping and mopping the floor.  She is able to cook and wash dishes with no problems.  Dyspnea only if she walks a "long way."  No lightheadedness.  No chest pain.  No orthopnea/PND.   Weight stable.   Labs (7/17): Digoxin 1.6 => 0.3, K 4.6, creatinine 0.87 => 0.76, HCT 34, BNP 265 Labs (9/17): K 5, creatinine 1.36 Labs (11/17): K 4.9, creatinine 1.29, hgb 9.2, digoxin 0.5 Labs (12/17): hgb 10.4, digoxin 0.4 Labs (1/18): K 4, creatinine 1.62 => 1.49, hgb 9.5, LDL 42, HDL 63 Labs (3/18): K 4.1, creatinine 1.44, digoxin 0.4 Labs (6/18): K 3.9, creatinine 1.7, hgb 11.5, LDL 56, HDL 50 Labs (6/19): K 4.4, creatinine 1.7, hgb 12.8, LDL 54 Labs (10/19): K 4.2, creatinine 1.46  PMH: 1. Atrial fibrillation: Paroxysmal.  Had been persistent since 8/16, was cardioverted to NSR during 7/17 admission.  Possible atypical atrial flutter on 12/17 ECG.  By 3/18, atrial fibrillation again.  - Nausea with ranolazine used for atrial fibrillation suppression.  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  - AV nodal ablation 3/18 with CRT-P.  2. Chronic  systolic CHF: Nonischemic cardiomyopathy, possibly tachycardia-mediated.  She had been in atrial fibrillation with elevated rate possibly for months prior to 7/17 admission.  - Echo (10/16): EF 50-55%.  - Echo (7/17): EF 20-25%, mildly decreased RV systolic function, severe TR, PA systolic pressure 64 mmHg.  - LHC/RHC (7/17): Patent OM stent, no obstructive CAD.  Mean RA 13, PA 45/25, mean PCWP 16, CI 2.36.  - Echo (1/18): EF 30-35% with diffuse hypokinesis, mildly decreased RV systolic  function, moderate TR and MR, PASP 53 mmHg, severe LAE.  - St Jude CRT-P placed with AV nodal ablation in 3/18.  - Echo (9/19): EF 60-65%, moderate LVH, grade 3 diastolic dysfunction, mild MR, mild AI, moderate TR, PASP 43 mmHg.  3. CAD: DES to OM in 2007.  LHC (7/17) with patent stent and no new obstructive disease.  4. Type II diabetes 5. Hyperlipidemia 6. TIA 7. Possible amiodarone lung toxicity: Chest CT (7/17) with bronchiectasis/fibrosis, patchy bilateral densities.    Past Surgical History:  Procedure Laterality Date  . ATRIAL FLUTTER ABLATION N/A 10/09/2012   Procedure: ATRIAL FLUTTER ABLATION;  Surgeon: Thompson Grayer, MD;  Location[facility] CATH LAB;  Service: Cardiovascular;  Laterality: N/A;  . AV NODE ABLATION N/A 09/27/2016   Procedure: AV Node Ablation;  Surgeon: Thompson Grayer, MD;  Location: [place] CV LAB;  Service: Cardiovascular;  Laterality: N/A;  . BIV PACEMAKER INSERTION CRT-P N/A 09/27/2016   Procedure: BiV Pacemaker Insertion CRT-P;  Surgeon: Thompson Grayer, MD;  Location: [place] CV LAB;  Service: Cardiovascular;  Laterality: N/A;  . CARDIAC CATHETERIZATION N/A 01/06/2016   Procedure: Right/Left Heart Cath and Coronary Angiography;  Surgeon: Belva Crome, MD;  Location: [place] CV LAB;  Service: Cardiovascular;  Laterality: N/A;  . CARDIOVERSION N/A 01/11/2016   Procedure: CARDIOVERSION;  Surgeon: Larey Dresser, MD;  Location: [facility] ENDOSCOPY;  Service: Cardiovascular;  Laterality: N/A;  . cataracts    . COLONOSCOPY N/A 10/13/2015   Procedure: COLONOSCOPY;  Surgeon: Manus Gunning, MD;  Location: [facility] ENDOSCOPY;  Service: Gastroenterology;  Laterality: N/A;  . CORONARY ANGIOPLASTY     Status post PTCA and stenting of the first obtuse marginal-05/13/2006. We placed a 3.0 x 20 mm Taxus stent. It was post dilated using a 3.25 mm noncompliant balloon up to 14 atmospheres  . IMPLANT POCKET INCISION & DRAINAGE N/A 11/02/2016   Procedure: Implant Pocket Incision &  Drainage;  Surgeon: Evans Lance, MD;  Location: [place] CV LAB;  Service: Cardiovascular;  Laterality: N/A;  . TONSILLECTOMY       Current Outpatient Medications  Medication Sig Dispense Refill  . ACCU-CHEK FASTCLIX LANCETS MISC USE TO CHECK BLOOD SUGAR TWICE DAILY  5  . ACCU-CHEK SMARTVIEW test strip     . atorvastatin (LIPITOR) 20 MG tablet TAKE 1 TABLET BY MOUTH EVERY DAY 90 tablet 1  . bisoprolol (ZEBETA) 5 MG tablet TAKE 1.5 TABLETS (7.5 MG TOTAL) BY MOUTH DAILY. PLEASE CANCEL ALL PREVIOUS ORDERS FOR CURRENT MEDICATION. CHANGE IN DOSAGE OR PILL SIZE. 135 tablet 1  . Cholecalciferol (VITAMIN D) 2000 units tablet Take 2,000 Units by mouth daily.    [name] ELIQUIS 2.5 MG TABS tablet TAKE 1 TABLET BY MOUTH TWICE A DAY 180 tablet 3  . furosemide (LASIX) 20 MG tablet TAKE 20 MG (1 TAB) ONCE DAILY ALTERNATING WITH 40 MG (2 TABS) THE NEXT DAY CONTINUOUSLY. PLEASE CALL OFFICE TO SCHEDULE VISIT [phone] 270 tablet 0  . HYDROcodone-acetaminophen (NORCO/VICODIN) 5-325 MG tablet Take 1  tablet by mouth daily as needed for pain.    . metFORMIN (GLUCOPHAGE) 500 MG tablet Take 500 mg by mouth daily with supper.     . nitroGLYCERIN (NITROSTAT) 0.4 MG SL tablet TAKE 1 TABLET BY MOUTH UNDER THE TONGUE EVERY 5 MINUTES FOR 3 DOSES AS NEEDED FOR CHEST PAIN 25 tablet 2  . spironolactone (ALDACTONE) 25 MG tablet TAKE 1 TABLET BY MOUTH EVERY DAY 90 tablet 1  . vitamin C (ASCORBIC ACID) 500 MG tablet Take 500 mg by mouth daily.     No current facility-administered medications for this encounter.     Allergies:   Amiodarone, Ranexa [ranolazine], Other, Demerol, Erythromycin, Penicillins, and Percocet [oxycodone-acetaminophen]   Social History:  The patient  reports that she quit smoking about 55 years ago. Her smoking use included cigarettes. She smoked 1.00 pack per day. She has never used smokeless tobacco. She reports that she does not drink alcohol or use drugs.   Family History:  The patient's family  history includes Alzheimer's disease in her sister; Coronary artery disease in her sister; Heart attack in her brother, father, mother, and sister; Stroke in her mother and sister.   ROS:  Please see the history of present illness.   All other systems are personally reviewed and negative.   Exam:  (Video/Tele Health Call; Exam is subjective and or/visual.) BP 113/74, HR 60, wt 127 lbs General:  Speaks in full sentences. No resp difficulty. Lungs: Normal respiratory effort with conversation.  Abdomen: Non-distended per patient report Extremities: Pt denies edema. Neuro: Alert & oriented x 3.   Recent Labs: No results found for requested labs within last 8760 hours.  Personally reviewed   Wt Readings from Last 3 Encounters:  07/15/18 58.1 kg (128 lb)  04/08/18 56.9 kg (125 lb 6.4 oz)  12/05/17 56.2 kg (124 lb)      ASSESSMENT AND PLAN:  1. Chronic systolic CHF: EF 67-12% on 7/17 echo, 30-35% on 1/18 echo.  Nonischemic cardiomyopathy, possible role for tachycardia-mediated cardiomyopathy (some improvement in EF with rate control but not back to normal). ECHO 03/2017 EF 40-45%  She had AV nodal ablation due to difficult-to-control atrial fibrillation in 3/18 with St Jude CRT-P placement. Repeat echo in 9/19 showed EF up to 60-65%.  NYHA class II symptoms, stable weight.   - Continue Lasix 40 mg daily alternating with 20 mg daily.  - Unable to tolerate losartan at even low dose.  - Continue bisoprolol 7.5 mg daily.  - Continue spironolactone 25 mg daily.  - I will arrange for BMET.  - I will arrange for repeat echo to make sure that EF remains normal.  2. Atrial fibrillation/flutter:  Concern for tachy-mediated cardiomyopathy.  She had suspected amiodarone-induced lung toxicity so ranolazine was started.  She did not tolerate ranolazine due to nausea. Poor Tikosyn candidate with CKD.   She tolerated atrial fibrillation poorly and rate was difficult to control, so she had AV nodal ablation  with St Jude CRT-P device.  She seems to be doing better since the ablation.  - Continue apixaban 2.5 mg bid. I will arrange for CBC.  3. CAD: Patent OM stent on coronary angiography in 7/17.  No chest pain.  - Continue statin, I will arrange to check lipids.  - No ASA with warfarin use.    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  Off amiodarone.  Follows with pulmonary.   COVID screen The patient does not have any symptoms that suggest any further testing/ screening  at this time.  Social distancing reinforced today.  Patient Risk: After full review of this patients clinical status, I feel that they are at moderate risk for cardiac decompensation at this time.  Relevant cardiac medications were reviewed at length with the patient today. The patient does not have concerns regarding their medications at this time.   Recommended follow-up:  6 months  Today, I have spent 16 minutes with the patient with telehealth technology discussing the above issues .    Signed, [name], MD  04/19/2019  [place] [address] [facility] and [place] [address] [phone] (office) [phone] (fax)

## 2019-04-27 ENCOUNTER — Other Ambulatory Visit (HOSPITAL_COMMUNITY): Payer: Self-pay

## 2019-04-27 MED ORDER — APIXABAN 2.5 MG PO TABS: 2.5000 mg | ORAL_TABLET | Freq: Two times a day (BID) | ORAL | 3 refills | Status: DC

## 2019-04-28 ENCOUNTER — Other Ambulatory Visit: Payer: Self-pay

## 2019-04-28 ENCOUNTER — Ambulatory Visit (HOSPITAL_BASED_OUTPATIENT_CLINIC_OR_DEPARTMENT_OTHER)
Admission: RE | Admit: 2019-04-28 | Discharge: 2019-04-28 | Disposition: A | Discharge status: Home / Self Care | Payer: PPO | Source: Ambulatory Visit

## 2019-04-28 ENCOUNTER — Telehealth (HOSPITAL_COMMUNITY): Payer: Self-pay | Admitting: *Deleted

## 2019-04-28 ENCOUNTER — Ambulatory Visit (HOSPITAL_COMMUNITY)
Admission: RE | Admit: 2019-04-28 | Discharge: 2019-04-28 | Disposition: A | Discharge status: Home / Self Care | Payer: PPO | Source: Ambulatory Visit | Attending: Cardiology | Admitting: Cardiology

## 2019-04-28 DIAGNOSIS — I110 Hypertensive heart disease with heart failure: Secondary | ICD-10-CM | POA: Insufficient documentation

## 2019-04-28 DIAGNOSIS — I083 Combined rheumatic disorders of mitral, aortic and tricuspid valves: Secondary | ICD-10-CM | POA: Insufficient documentation

## 2019-04-28 DIAGNOSIS — I4891 Unspecified atrial fibrillation: Secondary | ICD-10-CM | POA: Insufficient documentation

## 2019-04-28 DIAGNOSIS — I2510 Atherosclerotic heart disease of native coronary artery without angina pectoris: Secondary | ICD-10-CM | POA: Insufficient documentation

## 2019-04-28 DIAGNOSIS — I5022 Chronic systolic (congestive) heart failure: Secondary | ICD-10-CM

## 2019-04-28 DIAGNOSIS — E119 Type 2 diabetes mellitus without complications: Secondary | ICD-10-CM | POA: Insufficient documentation

## 2019-04-28 DIAGNOSIS — Z8673 Personal history of transient ischemic attack (TIA), and cerebral infarction without residual deficits: Secondary | ICD-10-CM | POA: Insufficient documentation

## 2019-04-28 LAB — CBC
HCT: 35.1 % — ABNORMAL LOW (ref 36.0–46.0)
Hemoglobin: 11.8 g/dL — ABNORMAL LOW (ref 12.0–15.0)
MCH: 35.3 pg — ABNORMAL HIGH (ref 26.0–34.0)
MCHC: 33.6 g/dL (ref 30.0–36.0)
MCV: 105.1 fL — ABNORMAL HIGH (ref 80.0–100.0)
Platelets: 210 10*3/uL (ref 150–400)
RBC: 3.34 MIL/uL — ABNORMAL LOW (ref 3.87–5.11)
RDW: 15.3 % (ref 11.5–15.5)
WBC: 8.8 10*3/uL (ref 4.0–10.5)
nRBC: 0 % (ref 0.0–0.2)

## 2019-04-28 LAB — BASIC METABOLIC PANEL
Anion gap: 14 (ref 5–15)
BUN: 30 mg/dL — ABNORMAL HIGH (ref 8–23)
CO2: 23 mmol/L (ref 22–32)
Calcium: 9.5 mg/dL (ref 8.9–10.3)
Chloride: 101 mmol/L (ref 98–111)
Creatinine, Ser: 1.66 mg/dL — ABNORMAL HIGH (ref 0.44–1.00)
GFR calc Af Amer: 32 mL/min — ABNORMAL LOW (ref >60)
GFR calc non Af Amer: 28 mL/min — ABNORMAL LOW (ref >60)
Glucose, Bld: 130 mg/dL — ABNORMAL HIGH (ref 70–99)
Potassium: 4.2 mmol/L (ref 3.5–5.1)
Sodium: 138 mmol/L (ref 135–145)

## 2019-04-28 LAB — LIPID PANEL
Cholesterol: 94 mg/dL (ref 0–200)
HDL: 49 mg/dL (ref >40)
LDL Cholesterol: 30 mg/dL (ref 0–99)
Total CHOL/HDL Ratio: 1.9 RATIO
Triglycerides: 76 mg/dL (ref <150)
VLDL: 15 mg/dL (ref 0–40)

## 2019-04-28 NOTE — Telephone Encounter (Signed)
Pt called to see if her labs today were fasting labs or if she could have breakfast. Per her provider labs are not fasting labs. Pt aware.

## 2019-04-28 NOTE — Progress Notes (Signed)
  Echocardiogram 2D Echocardiogram has been performed.  Jannett Celestine 04/28/2019, 2:45 PM

## 2019-04-29 ENCOUNTER — Telehealth (HOSPITAL_COMMUNITY): Payer: Self-pay

## 2019-04-29 NOTE — Telephone Encounter (Signed)
-----   Message from Larey Dresser, MD sent at 04/28/2019 10:45 PM EDT ----- EF 55-60%, mild MR.

## 2019-04-29 NOTE — Telephone Encounter (Signed)
Pt returned call, pt aware of results of echo

## 2019-04-30 ENCOUNTER — Other Ambulatory Visit (HOSPITAL_COMMUNITY): Payer: Self-pay

## 2019-04-30 MED ORDER — APIXABAN 2.5 MG PO TABS: 2.5000 mg | ORAL_TABLET | Freq: Two times a day (BID) | ORAL | 3 refills | Status: DC

## 2019-06-29 ENCOUNTER — Other Ambulatory Visit: Payer: Self-pay | Admitting: Cardiology

## 2019-07-07 ENCOUNTER — Ambulatory Visit (INDEPENDENT_AMBULATORY_CARE_PROVIDER_SITE_OTHER): Payer: PPO | Admitting: *Deleted

## 2019-07-07 DIAGNOSIS — I442 Atrioventricular block, complete: Secondary | ICD-10-CM

## 2019-07-07 LAB — CUP PACEART REMOTE DEVICE CHECK
Battery Remaining Longevity: 49 mo
Battery Remaining Percentage: 89 %
Battery Voltage: 2.95 V
Date Time Interrogation Session: 20210105061606
Implantable Lead Implant Date: 20180329
Implantable Lead Implant Date: 20180329
Implantable Lead Implant Date: 20180329
Implantable Lead Location: 753859
Implantable Lead Location: 753860
Implantable Lead Location: 753860
Implantable Lead Model: 3830
Implantable Lead Model: 5076
Implantable Pulse Generator Implant Date: 20180329
Lead Channel Impedance Value: 360 Ohm
Lead Channel Impedance Value: 410 Ohm
Lead Channel Pacing Threshold Amplitude: 0.75 V
Lead Channel Pacing Threshold Amplitude: 2 V
Lead Channel Pacing Threshold Pulse Width: 0.5 ms
Lead Channel Pacing Threshold Pulse Width: 1 ms
Lead Channel Sensing Intrinsic Amplitude: 4.8 mV
Lead Channel Setting Pacing Amplitude: 2 V
Lead Channel Setting Pacing Amplitude: 3 V
Lead Channel Setting Pacing Pulse Width: 0.5 ms
Lead Channel Setting Pacing Pulse Width: 1 ms
Lead Channel Setting Sensing Sensitivity: 4 mV
Pulse Gen Model: 3222
Pulse Gen Serial Number: 8006933

## 2019-07-08 DIAGNOSIS — I509 Heart failure, unspecified: Secondary | ICD-10-CM | POA: Diagnosis not present

## 2019-07-08 DIAGNOSIS — I4821 Permanent atrial fibrillation: Secondary | ICD-10-CM | POA: Diagnosis not present

## 2019-07-08 DIAGNOSIS — N1832 Chronic kidney disease, stage 3b: Secondary | ICD-10-CM | POA: Diagnosis not present

## 2019-07-08 DIAGNOSIS — I209 Angina pectoris, unspecified: Secondary | ICD-10-CM | POA: Diagnosis not present

## 2019-07-08 DIAGNOSIS — D692 Other nonthrombocytopenic purpura: Secondary | ICD-10-CM | POA: Diagnosis not present

## 2019-07-08 DIAGNOSIS — I739 Peripheral vascular disease, unspecified: Secondary | ICD-10-CM | POA: Diagnosis not present

## 2019-07-08 DIAGNOSIS — Z7901 Long term (current) use of anticoagulants: Secondary | ICD-10-CM | POA: Diagnosis not present

## 2019-07-08 DIAGNOSIS — E7800 Pure hypercholesterolemia, unspecified: Secondary | ICD-10-CM | POA: Diagnosis not present

## 2019-07-08 DIAGNOSIS — I1310 Hypertensive heart and chronic kidney disease without heart failure, with stage 1 through stage 4 chronic kidney disease, or unspecified chronic kidney disease: Secondary | ICD-10-CM | POA: Diagnosis not present

## 2019-07-08 DIAGNOSIS — E1151 Type 2 diabetes mellitus with diabetic peripheral angiopathy without gangrene: Secondary | ICD-10-CM | POA: Diagnosis not present

## 2019-07-08 DIAGNOSIS — E1140 Type 2 diabetes mellitus with diabetic neuropathy, unspecified: Secondary | ICD-10-CM | POA: Diagnosis not present

## 2019-07-22 ENCOUNTER — Other Ambulatory Visit (HOSPITAL_COMMUNITY): Payer: Self-pay | Admitting: Cardiology

## 2019-08-25 DIAGNOSIS — E1165 Type 2 diabetes mellitus with hyperglycemia: Secondary | ICD-10-CM | POA: Insufficient documentation

## 2019-08-25 DIAGNOSIS — S8002XA Contusion of left knee, initial encounter: Secondary | ICD-10-CM | POA: Diagnosis not present

## 2019-08-25 DIAGNOSIS — I679 Cerebrovascular disease, unspecified: Secondary | ICD-10-CM | POA: Insufficient documentation

## 2019-08-25 DIAGNOSIS — M7989 Other specified soft tissue disorders: Secondary | ICD-10-CM | POA: Diagnosis not present

## 2019-08-25 DIAGNOSIS — M722 Plantar fascial fibromatosis: Secondary | ICD-10-CM | POA: Insufficient documentation

## 2019-08-25 DIAGNOSIS — S8992XA Unspecified injury of left lower leg, initial encounter: Secondary | ICD-10-CM | POA: Diagnosis not present

## 2019-08-25 DIAGNOSIS — I10 Essential (primary) hypertension: Secondary | ICD-10-CM | POA: Insufficient documentation

## 2019-08-25 DIAGNOSIS — M1712 Unilateral primary osteoarthritis, left knee: Secondary | ICD-10-CM | POA: Diagnosis not present

## 2019-08-25 DIAGNOSIS — M25562 Pain in left knee: Secondary | ICD-10-CM | POA: Diagnosis not present

## 2019-09-01 ENCOUNTER — Other Ambulatory Visit: Payer: Self-pay

## 2019-09-01 ENCOUNTER — Telehealth (INDEPENDENT_AMBULATORY_CARE_PROVIDER_SITE_OTHER): Payer: PPO | Admitting: Cardiovascular Disease

## 2019-09-01 ENCOUNTER — Encounter: Payer: Self-pay | Admitting: Cardiovascular Disease

## 2019-09-01 VITALS — BP 109/72 | HR 60 | Ht 64.0 in | Wt 125.0 lb

## 2019-09-01 DIAGNOSIS — Z9889 Other specified postprocedural states: Secondary | ICD-10-CM | POA: Diagnosis not present

## 2019-09-01 DIAGNOSIS — I482 Chronic atrial fibrillation, unspecified: Secondary | ICD-10-CM

## 2019-09-01 DIAGNOSIS — I4821 Permanent atrial fibrillation: Secondary | ICD-10-CM

## 2019-09-01 DIAGNOSIS — I428 Other cardiomyopathies: Secondary | ICD-10-CM

## 2019-09-01 DIAGNOSIS — I2510 Atherosclerotic heart disease of native coronary artery without angina pectoris: Secondary | ICD-10-CM

## 2019-09-01 DIAGNOSIS — Z950 Presence of cardiac pacemaker: Secondary | ICD-10-CM

## 2019-09-01 DIAGNOSIS — E785 Hyperlipidemia, unspecified: Secondary | ICD-10-CM | POA: Diagnosis not present

## 2019-09-01 DIAGNOSIS — I5022 Chronic systolic (congestive) heart failure: Secondary | ICD-10-CM | POA: Diagnosis not present

## 2019-09-01 NOTE — Progress Notes (Signed)
Virtual Visit via Telephone Note   This visit type was conducted due to national recommendations for restrictions regarding the COVID-19 Pandemic (e.g. social distancing) in an effort to limit this patient's exposure and mitigate transmission in our community.  Due to her co-morbid illnesses, this patient is at least at moderate risk for complications without adequate follow up.  This format is felt to be most appropriate for this patient at this time.  The patient did not have access to video technology/had technical difficulties with video requiring transitioning to audio format only (telephone).  All issues noted in this document were discussed and addressed.  No physical exam could be performed with this format.  Please refer to the patient's chart for her  consent to telehealth for [facility].   Date:  09/01/2019   ID:  Shannon [name], DOB 1931/11/15, MRN [id number]  Patient Location: Home Provider Location: Office  PCP:  Haywood Pao, MD  Cardiologist:  Mertie Moores, MD  Electrophysiologist:  None   Evaluation Performed:  Follow-Up Visit  Chief Complaint:  CAD     Past notes:  1. Coronary artery disease: Status post PTCA and stenting of the first obtuse marginal-05/13/2006. We placed a 3.0 x 20 mm Taxus stent. It was post dilated using a 3.25 mm noncompliant balloon up to 14 atmospheres 2. Diabetes mellitus 3. Hyperlipidemia 4. Paroxysmal Atrial fib    Shannon [name] is an 86 y.o. female with a hx of CAD. She has some occasional episodes of chest pressure typically when she wakes up in the morning. She also wakes up with a severe headache. She's been worked up for sleep apnea. She works out in the yard on a fairly regular basis and does not have any chest pain or chest tightness with exercise.   She is active all day long. She gets tired but never has any chest pain.  Feb. 12, 2015:  Shannon [name] is doing well. She was in the hosptial recently with Atrial fib  and atrial flutter. She had an EP study but was not able to be ablated. She was cardioverted and has maintained NSR. She is able to do all of her normal activities. She is tolerating her pradaxa . Sometimes she forgets  February 15, 2014:  Shannon [name] is doing well. No CP. She had had some bloody mucus and had some sinus problems. Likely due to the pradaxa. No significant arrhthymias    September 20, 2014: Shannon [name] is a 86 y.o. female who presents for follow up of her CAD She is upset about her husband, Shannon [name]. He fell last August and has been bedridden since that time.  Has not been able to get him to his pacer appts.   No CP or dyspnea.  She has occasional nose bleeds  - likely due to the Pradaxa   Oct. 4, 2016: Shannon [name] is seen today for evaluation of rapid atrial fib.  She has paroxysmal atrial fib - has been in atrial fib off and of  for the past 12 days   Frequently takes extra metoprolol to help keep her  HR slower  Has had excessive fatigue, leg swelling   Oct. 11, 2016 Shannon [name] is back for follow up Was started on amiodarone. She's had some nausea and lack of appetite since starting the amiodarone. She has converted to sinus rhythm. She is feeling better now that she is in normal sinus rhythm. She's had a history of paroxysmal atrial fibrillation. She has seen Dr. Rayann Heman in the  past. They discussed A. fib ablation but Dr. Rayann Heman decided that she was not a good candidate for ablation.  Jan. 20, 2017:  Doing ok.  No dizziness, no dyspnea, no CP   Dec 27, 2015:  Complains of being very weak Has had GI bleeding on pradaxa.  Was hospitalized Now is on coumadin  Going to our coumadin clinic  Has developed a cough.   Looks pale today .   Is on Vit D and iron tablets Has not had any recent bleeding .   Has a poor appetite.     Was so weak that she could not weight herself.   Apr 01, 2017   Shannon [name] is seen back after 1 1/2 year. No  angina Seems to be doing well  Had a pacer ( St. Jude CRT)  placed in March , 2018 LV function has improved significantly after the CRT. EF 40-45%.  Now is seeing [name] in the CHF clinic, [name] in EP.   July 15, 2018: Shannon [name] is seen back today for her yearly visit.  She has a CRT pacemaker.  She feels quite a bit better after her CRT pacer.  No CP or dyspnea.  Sees [facility] every 6 months . EF has normalized.  Her husband , Shannon [name] has been in the SNF for several years. Is now paralized   The patient does not have symptoms concerning for COVID-19 infection (fever, chills, cough, or new shortness of breath).    September 01, 2019   Shannon [name] is a 86 y.o. female with hx of CAD and stenting, paroxysmal atrial fib., CHF - CRT pacer with improvement of her LV function  Has been doing well Lots of housework [place] and hurt her leg in January.   BP has been stable  No cp  .  Breathing is good Has not been exercising .  Has not had the covid vaccine  No cough or cold symptoms No bleeding issues,  No blood in stool , No syncope or near syncope  No rash  Glucose levels have been good  Her husband , Shannon [name] , passed away last year at age 11.   Past Medical History:  Diagnosis Date  . Arthritis   . Baker's cyst of knee   . CAD (coronary artery disease)    a. 05/2006 Cath/PCI: LAD 3m, D1 40 ost, LCX nl, OM1 95 (3.0x20 Taxus DES), RCA nl, EF 40% w/ lat AK;  04/2007 Ex MV: minimal lat ischemia in area of prior infarct->low risk-> med Rx.  . Chronic atrial fibrillation (HCC)    a. on Coumadin  . Chronic systolic (congestive) heart failure ([place])    a. 04/2015: EF 50-55% b. echo 12/2015: EF 20-25% w/ diffuse HK, biatrial enlargement, mild AI and MR, severe TR    . Diabetes mellitus   . Hyperlipidemia   . MVP (mitral valve prolapse)    a. 10/2003 Echo: nl LV fxn, mild MR/TR/AS  . Myocardial infarction ([place])   . Sleep apnea   . Transient ischemic attack    Past Surgical  History:  Procedure Laterality Date  . ATRIAL FLUTTER ABLATION N/A 10/09/2012   Procedure: ATRIAL FLUTTER ABLATION;  Surgeon: Thompson Grayer, MD;  Location: [facility] CATH LAB;  Service: Cardiovascular;  Laterality: N/A;  . AV NODE ABLATION N/A 09/27/2016   Procedure: AV Node Ablation;  Surgeon: Thompson Grayer, MD;  Location: [place] CV LAB;  Service: Cardiovascular;  Laterality: N/A;  . BIV PACEMAKER INSERTION CRT-P N/A 09/27/2016  Procedure: BiV Pacemaker Insertion CRT-P;  Surgeon: Thompson Grayer, MD;  Location: [place] CV LAB;  Service: Cardiovascular;  Laterality: N/A;  . CARDIAC CATHETERIZATION N/A 01/06/2016   Procedure: Right/Left Heart Cath and Coronary Angiography;  Surgeon: Belva Crome, MD;  Location: [place] CV LAB;  Service: Cardiovascular;  Laterality: N/A;  . CARDIOVERSION N/A 01/11/2016   Procedure: CARDIOVERSION;  Surgeon: Larey Dresser, MD;  Location: [facility] ENDOSCOPY;  Service: Cardiovascular;  Laterality: N/A;  . cataracts    . COLONOSCOPY N/A 10/13/2015   Procedure: COLONOSCOPY;  Surgeon: Manus Gunning, MD;  Location: [facility] ENDOSCOPY;  Service: Gastroenterology;  Laterality: N/A;  . CORONARY ANGIOPLASTY     Status post PTCA and stenting of the first obtuse marginal-05/13/2006. We placed a 3.0 x 20 mm Taxus stent. It was post dilated using a 3.25 mm noncompliant balloon up to 14 atmospheres  . IMPLANT POCKET INCISION & DRAINAGE N/A 11/02/2016   Procedure: Implant Pocket Incision & Drainage;  Surgeon: Evans Lance, MD;  Location: [place] CV LAB;  Service: Cardiovascular;  Laterality: N/A;  . TONSILLECTOMY       Current Meds  Medication Sig  . ACCU-CHEK FASTCLIX LANCETS MISC USE TO CHECK BLOOD SUGAR TWICE DAILY  . ACCU-CHEK SMARTVIEW test strip   . apixaban (ELIQUIS) 2.5 MG TABS tablet Take 1 tablet (2.5 mg total) by mouth 2 (two) times daily.  [name] atorvastatin (LIPITOR) 20 MG tablet TAKE 1 TABLET BY MOUTH EVERY DAY  . bisoprolol (ZEBETA) 5 MG tablet TAKE 1.5 TABLETS  (7.5 MG TOTAL) BY MOUTH DAILY. PLEASE CANCEL ALL PREVIOUS ORDERS FOR CURRENT MEDICATION. CHANGE IN DOSAGE OR PILL SIZE.  [name] Cholecalciferol (VITAMIN D) 2000 units tablet Take 2,000 Units by mouth daily.  . furosemide (LASIX) 20 MG tablet TAKE 20 MG (1 TAB) ONCE DAILY ALTERNATING WITH 40 MG (2 TABS)  . HYDROcodone-acetaminophen (NORCO/VICODIN) 5-325 MG tablet Take 1 tablet by mouth daily as needed for pain.  . metFORMIN (GLUCOPHAGE) 500 MG tablet Take 500 mg by mouth daily with supper.   . nitroGLYCERIN (NITROSTAT) 0.4 MG SL tablet TAKE 1 TABLET BY MOUTH UNDER THE TONGUE EVERY 5 MINUTES FOR 3 DOSES AS NEEDED FOR CHEST PAIN  . spironolactone (ALDACTONE) 25 MG tablet TAKE 1 TABLET BY MOUTH EVERY DAY  . vitamin C (ASCORBIC ACID) 500 MG tablet Take 500 mg by mouth daily.     Allergies:   Amiodarone, Ranexa [ranolazine], Other, Demerol, Erythromycin, Penicillins, and Percocet [oxycodone-acetaminophen]   Social History   Tobacco Use  . Smoking status: Former Smoker    Packs/day: 1.00    Types: Cigarettes    Quit date: 07/03/1963    Years since quitting: 56.2  . Smokeless tobacco: Never Used  Substance Use Topics  . Alcohol use: No  . Drug use: No     Family Hx: The patient's family history includes Alzheimer's disease in her sister; Coronary artery disease in her sister; Heart attack in her brother, father, mother, and sister; Stroke in her mother and sister.  ROS:   Please see the history of present illness.     All other systems reviewed and are negative.   Prior CV studies:   The following studies were reviewed today:    Labs/Other Tests and Data Reviewed:    EKG:  No ECG reviewed.  Recent Labs: 04/28/2019: BUN 30; Creatinine, Ser 1.66; Hemoglobin 11.8; Platelets 210; Potassium 4.2; Sodium 138   Recent Lipid Panel Lab Results  Component Value Date/Time  CHOL 94 04/28/2019 01:42 PM   TRIG 76 04/28/2019 01:42 PM   HDL 49 04/28/2019 01:42 PM   CHOLHDL 1.9 04/28/2019 01:42  PM   LDLCALC 30 04/28/2019 01:42 PM    Wt Readings from Last 3 Encounters:  09/01/19 125 lb (56.7 kg)  07/15/18 128 lb (58.1 kg)  04/08/18 125 lb 6.4 oz (56.9 kg)     Objective:    Vital Signs:  BP 109/72   Pulse 60   Ht 5\' 4"  (1.626 m)   Wt 125 lb (56.7 kg)   BMI 21.46 kg/m       ASSESSMENT & PLAN:    1. Coronary artery disease:   No angina   2. Chronic systolic congestive heart failure:     EF has improved following CRT .   No symptoms of CHF   3. Hyperlipidemia-lipid and liver enz look great.   Followed by Dr. Osborne Casco.    4.  Atrial fib- .    She has chronic atrial relation.  She is now status post AV node ablation and pacemaker. 1.       COVID-19 Education: The signs and symptoms of COVID-19 were discussed with the patient and how to seek care for testing (follow up with PCP or arrange E-visit).  The importance of social distancing was discussed today.  Time:   Today, I have spent  19  minutes with the patient with telehealth technology discussing the above problems.     Medication Adjustments/Labs and Tests Ordered: Current medicines are reviewed at length with the patient today.  Concerns regarding medicines are outlined above.   Tests Ordered: No orders of the defined types were placed in this encounter.   Medication Changes: No orders of the defined types were placed in this encounter.   Follow Up:  In Person in 6 month(s) with an APP   Signed, Mertie Moores, MD  09/01/2019 2:43 PM    [place]

## 2019-09-01 NOTE — Patient Instructions (Signed)
Medication Instructions:  Your physician recommends that you continue on your current medications as directed. Please refer to the Current Medication list given to you today.  *If you need a refill on your cardiac medications before your next appointment, please call your pharmacy*   Lab Work: Your physician recommends that you return for lab work in: 6 months on the day of or a few days before your office visit with Dr. Acie Fredrickson.  You will need to FAST for this appointment - nothing to eat or drink after midnight the night before except water.  If you have labs (blood work) drawn today and your tests are completely normal, you will receive your results only by: [name] MyChart Message (if you have MyChart) OR . A paper copy in the mail If you have any lab test that is abnormal or we need to change your treatment, we will call you to review the results.   Testing/Procedures: None Ordered    Follow-Up: At [facility], you and your health needs are our priority.  As part of our continuing mission to provide you with exceptional heart care, we have created designated Provider Care Teams.  These Care Teams include your primary Cardiologist (physician) and Advanced Practice Providers (APPs -  Physician Assistants and Nurse Practitioners) who all work together to provide you with the care you need, when you need it.   Your next appointment:   6 month(s)  The format for your next appointment:   In Person  Provider:   You may see Mertie Moores, MD or one of the following Advanced Practice Providers on your designated Care Team:    Richardson Dopp, PA-C  [place], [place]  Daune Perch, [place]

## 2019-09-17 ENCOUNTER — Other Ambulatory Visit: Payer: Self-pay | Admitting: Cardiovascular Disease

## 2019-09-23 ENCOUNTER — Other Ambulatory Visit: Payer: Self-pay | Admitting: Cardiology

## 2019-09-25 ENCOUNTER — Other Ambulatory Visit (HOSPITAL_COMMUNITY): Payer: Self-pay

## 2019-09-25 MED ORDER — NITROGLYCERIN 0.4 MG SL SUBL: SUBLINGUAL_TABLET | SUBLINGUAL | 2 refills | Status: DC

## 2019-10-06 ENCOUNTER — Ambulatory Visit (INDEPENDENT_AMBULATORY_CARE_PROVIDER_SITE_OTHER): Payer: PPO | Admitting: *Deleted

## 2019-10-06 DIAGNOSIS — I442 Atrioventricular block, complete: Secondary | ICD-10-CM | POA: Diagnosis not present

## 2019-10-11 LAB — CUP PACEART REMOTE DEVICE CHECK
Battery Remaining Longevity: 49 mo
Battery Remaining Percentage: 89 %
Battery Voltage: 2.95 V
Date Time Interrogation Session: 20210410160028
Implantable Lead Implant Date: 20180329
Implantable Lead Implant Date: 20180329
Implantable Lead Implant Date: 20180329
Implantable Lead Location: 753859
Implantable Lead Location: 753860
Implantable Lead Location: 753860
Implantable Lead Model: 3830
Implantable Lead Model: 5076
Implantable Pulse Generator Implant Date: 20180329
Lead Channel Impedance Value: 360 Ohm
Lead Channel Impedance Value: 410 Ohm
Lead Channel Pacing Threshold Amplitude: 0.75 V
Lead Channel Pacing Threshold Amplitude: 2 V
Lead Channel Pacing Threshold Pulse Width: 0.5 ms
Lead Channel Pacing Threshold Pulse Width: 1 ms
Lead Channel Sensing Intrinsic Amplitude: 4.3 mV
Lead Channel Setting Pacing Amplitude: 2 V
Lead Channel Setting Pacing Amplitude: 3 V
Lead Channel Setting Pacing Pulse Width: 0.5 ms
Lead Channel Setting Pacing Pulse Width: 1 ms
Lead Channel Setting Sensing Sensitivity: 4 mV
Pulse Gen Model: 3222
Pulse Gen Serial Number: 8006933

## 2019-10-12 NOTE — Progress Notes (Signed)
PPM Remote  

## 2019-10-15 ENCOUNTER — Telehealth: Payer: Self-pay

## 2019-10-22 ENCOUNTER — Ambulatory Visit: Payer: PPO | Admitting: Internal Medicine

## 2019-10-22 ENCOUNTER — Encounter: Payer: Self-pay | Admitting: Internal Medicine

## 2019-10-22 ENCOUNTER — Other Ambulatory Visit: Payer: Self-pay

## 2019-10-22 VITALS — BP 100/66 | HR 60 | Ht 64.0 in | Wt 118.0 lb

## 2019-10-22 DIAGNOSIS — I428 Other cardiomyopathies: Secondary | ICD-10-CM

## 2019-10-22 DIAGNOSIS — I2510 Atherosclerotic heart disease of native coronary artery without angina pectoris: Secondary | ICD-10-CM

## 2019-10-22 DIAGNOSIS — E785 Hyperlipidemia, unspecified: Secondary | ICD-10-CM

## 2019-10-22 DIAGNOSIS — I4819 Other persistent atrial fibrillation: Secondary | ICD-10-CM

## 2019-10-22 DIAGNOSIS — D6869 Other thrombophilia: Secondary | ICD-10-CM

## 2019-10-22 DIAGNOSIS — I442 Atrioventricular block, complete: Secondary | ICD-10-CM

## 2019-10-22 DIAGNOSIS — Z950 Presence of cardiac pacemaker: Secondary | ICD-10-CM | POA: Diagnosis not present

## 2019-10-22 NOTE — Progress Notes (Signed)
PCP: Haywood Pao, MD Primary Cardiologist: Dr Acie Fredrickson CHF: [name] Primary EP:  Dr Rayann Heman  Shannon [name] is a 86 y.o. female who presents today for routine electrophysiology followup.  Since last being seen in our clinic, the patient reports doing very well.  Today, she denies symptoms of palpitations, chest pain, shortness of breath,  lower extremity edema, dizziness, presyncope, or syncope.  The patient is otherwise without complaint today.   Past Medical History:  Diagnosis Date  . Arthritis   . Baker's cyst of knee   . CAD (coronary artery disease)    a. 05/2006 Cath/PCI: LAD 74m, D1 40 ost, LCX nl, OM1 95 (3.0x20 Taxus DES), RCA nl, EF 40% w/ lat AK;  04/2007 Ex MV: minimal lat ischemia in area of prior infarct->low risk-> med Rx.  . Chronic atrial fibrillation (HCC)    a. on Coumadin  . Chronic systolic (congestive) heart failure ([place])    a. 04/2015: EF 50-55% b. echo 12/2015: EF 20-25% w/ diffuse HK, biatrial enlargement, mild AI and MR, severe TR    . Diabetes mellitus   . Hyperlipidemia   . MVP (mitral valve prolapse)    a. 10/2003 Echo: nl LV fxn, mild MR/TR/AS  . Myocardial infarction ([place])   . Sleep apnea   . Transient ischemic attack    Past Surgical History:  Procedure Laterality Date  . ATRIAL FLUTTER ABLATION N/A 10/09/2012   Procedure: ATRIAL FLUTTER ABLATION;  Surgeon: Thompson Grayer, MD;  Location: [facility] CATH LAB;  Service: Cardiovascular;  Laterality: N/A;  . AV NODE ABLATION N/A 09/27/2016   Procedure: AV Node Ablation;  Surgeon: Thompson Grayer, MD;  Location: [place] CV LAB;  Service: Cardiovascular;  Laterality: N/A;  . BIV PACEMAKER INSERTION CRT-P N/A 09/27/2016   Procedure: BiV Pacemaker Insertion CRT-P;  Surgeon: Thompson Grayer, MD;  Location: [place] CV LAB;  Service: Cardiovascular;  Laterality: N/A;  . CARDIAC CATHETERIZATION N/A 01/06/2016   Procedure: Right/Left Heart Cath and Coronary Angiography;  Surgeon: Belva Crome, MD;  Location: [place] CV LAB;  Service: Cardiovascular;  Laterality: N/A;  . CARDIOVERSION N/A 01/11/2016   Procedure: CARDIOVERSION;  Surgeon: Larey Dresser, MD;  Location: [facility] ENDOSCOPY;  Service: Cardiovascular;  Laterality: N/A;  . cataracts    . COLONOSCOPY N/A 10/13/2015   Procedure: COLONOSCOPY;  Surgeon: Manus Gunning, MD;  Location: [facility] ENDOSCOPY;  Service: Gastroenterology;  Laterality: N/A;  . CORONARY ANGIOPLASTY     Status post PTCA and stenting of the first obtuse marginal-05/13/2006. We placed a 3.0 x 20 mm Taxus stent. It was post dilated using a 3.25 mm noncompliant balloon up to 14 atmospheres  . IMPLANT POCKET INCISION & DRAINAGE N/A 11/02/2016   Procedure: Implant Pocket Incision & Drainage;  Surgeon: Evans Lance, MD;  Location: [place] CV LAB;  Service: Cardiovascular;  Laterality: N/A;  . TONSILLECTOMY      ROS- all systems are reviewed and negative except as per HPI above  Current Outpatient Medications  Medication Sig Dispense Refill  . ACCU-CHEK FASTCLIX LANCETS MISC USE TO CHECK BLOOD SUGAR TWICE DAILY  5  . ACCU-CHEK SMARTVIEW test strip     . apixaban (ELIQUIS) 2.5 MG TABS tablet Take 1 tablet (2.5 mg total) by mouth 2 (two) times daily. 180 tablet 3  . atorvastatin (LIPITOR) 20 MG tablet TAKE 1 TABLET BY MOUTH EVERY DAY 90 tablet 1  . bisoprolol (ZEBETA) 5 MG tablet TAKE 1.5 TABLETS (7.5 MG TOTAL)  BY MOUTH DAILY. PLEASE CANCEL ALL PREVIOUS ORDERS FOR CURRENT MEDICATION. CHANGE IN DOSAGE OR PILL SIZE. 135 tablet 1  . Cholecalciferol (VITAMIN D) 2000 units tablet Take 2,000 Units by mouth daily.    . furosemide (LASIX) 20 MG tablet TAKE 20 MG (1 TAB) ONCE DAILY ALTERNATING WITH 40 MG (2 TABS) 270 tablet 1  . HYDROcodone-acetaminophen (NORCO/VICODIN) 5-325 MG tablet Take 1 tablet by mouth daily as needed for pain.    . metFORMIN (GLUCOPHAGE) 500 MG tablet Take 500 mg by mouth daily with supper.     . nitroGLYCERIN (NITROSTAT) 0.4 MG SL tablet TAKE 1 TABLET BY MOUTH  UNDER THE TONGUE EVERY 5 MINUTES FOR 3 DOSES AS NEEDED FOR CHEST PAIN 25 tablet 2  . spironolactone (ALDACTONE) 25 MG tablet TAKE 1 TABLET BY MOUTH EVERY DAY 90 tablet 1  . vitamin C (ASCORBIC ACID) 500 MG tablet Take 500 mg by mouth daily.     No current facility-administered medications for this visit.    Physical Exam: Vitals:   10/22/19 1254  BP: 100/66  Pulse: 60  SpO2: 96%  Weight: 118 lb (53.5 kg)  Height: 5\' 4"  (1.626 m)    GEN- The patient is elderly appearing, alert and oriented x 3 today.   Head- normocephalic, atraumatic Eyes-  Sclera clear, conjunctiva pink Ears- hearing intact Oropharynx- clear Lungs-  normal work of breathing Chest- pacemaker pocket is well healed Heart- Regular rate and rhythm (paced) GI- soft  Extremities- no clubbing, cyanosis, or edema  Pacemaker interrogation- reviewed in detail today,  See PACEART report  ekg tracing ordered today is personally reviewed and shows afib with BiV pacing  Assessment and Plan:  1. Complete heart block Normal biv pacemaker function See Pace Art report No changes today she is not device dependant today  2. Permanent afib S/p AV nodal ablation On eliquis  3. Nonischemic CM EF normalized s/p CRT-P with AV nodal ablation  4. CAD No ischemic symptoms  5. HL Continue statin  Return to see EP APP in a year  Thompson Grayer MD, [facility] 10/22/2019 1:16 PM

## 2019-10-22 NOTE — Patient Instructions (Addendum)
Medication Instructions:  Your physician recommends that you continue on your current medications as directed. Please refer to the Current Medication list given to you today.  Labwork: None ordered.  Testing/Procedures: None ordered.  Follow-Up: Your physician wants you to follow-up in: one year with Oda Kilts PA.   You will receive a reminder letter in the mail two months in advance. If you don't receive a letter, please call our office to schedule the follow-up appointment.  Remote monitoring is used to monitor your Pacemaker from home. This monitoring reduces the number of office visits required to check your device to one time per year. It allows [place] to keep an eye on the functioning of your device to ensure it is working properly. You are scheduled for a device check from home on 01/05/2020. You may send your transmission at any time that day. If you have a wireless device, the transmission will be sent automatically. After your physician reviews your transmission, you will receive a postcard with your next transmission date.  Any Other Special Instructions Will Be Listed Below (If Applicable).  If you need a refill on your cardiac medications before your next appointment, please call your pharmacy.

## 2019-10-28 NOTE — Telephone Encounter (Signed)
Error

## 2019-10-29 ENCOUNTER — Encounter: Payer: Self-pay | Admitting: Podiatry

## 2019-10-29 ENCOUNTER — Ambulatory Visit: Payer: PPO | Admitting: Podiatry

## 2019-10-29 DIAGNOSIS — B351 Tinea unguium: Secondary | ICD-10-CM

## 2019-10-29 DIAGNOSIS — M79674 Pain in right toe(s): Secondary | ICD-10-CM

## 2019-10-29 DIAGNOSIS — M79675 Pain in left toe(s): Secondary | ICD-10-CM | POA: Diagnosis not present

## 2019-10-29 DIAGNOSIS — L97401 Non-pressure chronic ulcer of unspecified heel and midfoot limited to breakdown of skin: Secondary | ICD-10-CM | POA: Diagnosis not present

## 2019-10-29 NOTE — Progress Notes (Signed)
Subjective:   Patient ID: Shannon [name], female   DOB: 86 y.o.   MRN: [id number]   HPI Patient presents stating she has long-term diabetes she has a crack in her left heel and thickened nailbeds that she cannot cut herself and she does get some irritation of the plantar tissue.  Patient does have concerns about these conditions does not smoke and is not active currently   Review of Systems  All other systems reviewed and are negative.       Objective:  Physical Exam Vitals and nursing note reviewed.  Constitutional:      Appearance: She is well-developed.  Pulmonary:     Effort: Pulmonary effort is normal.  Musculoskeletal:        General: Normal range of motion.  Skin:    General: Skin is warm.  Neurological:     Mental Status: She is alert.     Vascular status found to be diminished neurological diminishment as far sharp dull vibratory diminished range of motion muscle strength noted bilateral.  Patient was noted to have thickened yellow brittle nailbeds 1-5 both feet a small fissure outside of the left heel and slight breakdown of tissue right hallux localized with no proximal edema erythema drainage noted    Assessment:  Long-term diabetic with significant multiple problems including mycotic nail infection fissures and very low-grade skin ulceration     Plan:  H&P all conditions reviewed debridement of nailbeds 1-5 both feet accomplished today debridement of lesion carefully with no breakdown of tissue advised on daily wound inspections and patient will be seen back as symptoms indicate and may require diabetic shoes

## 2019-11-09 ENCOUNTER — Other Ambulatory Visit (HOSPITAL_COMMUNITY): Payer: Self-pay | Admitting: Cardiology

## 2019-11-20 ENCOUNTER — Other Ambulatory Visit (HOSPITAL_COMMUNITY): Payer: Self-pay | Admitting: Cardiology

## 2019-12-01 ENCOUNTER — Other Ambulatory Visit (HOSPITAL_COMMUNITY): Payer: Self-pay | Admitting: Internal Medicine

## 2019-12-25 DIAGNOSIS — E7800 Pure hypercholesterolemia, unspecified: Secondary | ICD-10-CM | POA: Diagnosis not present

## 2019-12-25 DIAGNOSIS — E1151 Type 2 diabetes mellitus with diabetic peripheral angiopathy without gangrene: Secondary | ICD-10-CM | POA: Diagnosis not present

## 2019-12-25 DIAGNOSIS — E559 Vitamin D deficiency, unspecified: Secondary | ICD-10-CM | POA: Diagnosis not present

## 2019-12-29 DIAGNOSIS — E559 Vitamin D deficiency, unspecified: Secondary | ICD-10-CM | POA: Diagnosis not present

## 2019-12-29 DIAGNOSIS — I2510 Atherosclerotic heart disease of native coronary artery without angina pectoris: Secondary | ICD-10-CM | POA: Diagnosis not present

## 2019-12-29 DIAGNOSIS — E1151 Type 2 diabetes mellitus with diabetic peripheral angiopathy without gangrene: Secondary | ICD-10-CM | POA: Diagnosis not present

## 2019-12-29 DIAGNOSIS — Z955 Presence of coronary angioplasty implant and graft: Secondary | ICD-10-CM | POA: Diagnosis not present

## 2019-12-29 DIAGNOSIS — D692 Other nonthrombocytopenic purpura: Secondary | ICD-10-CM | POA: Diagnosis not present

## 2019-12-29 DIAGNOSIS — Z1339 Encounter for screening examination for other mental health and behavioral disorders: Secondary | ICD-10-CM | POA: Diagnosis not present

## 2019-12-29 DIAGNOSIS — M5136 Other intervertebral disc degeneration, lumbar region: Secondary | ICD-10-CM | POA: Diagnosis not present

## 2019-12-29 DIAGNOSIS — D638 Anemia in other chronic diseases classified elsewhere: Secondary | ICD-10-CM | POA: Diagnosis not present

## 2019-12-29 DIAGNOSIS — N1832 Chronic kidney disease, stage 3b: Secondary | ICD-10-CM | POA: Diagnosis not present

## 2019-12-29 DIAGNOSIS — M8580 Other specified disorders of bone density and structure, unspecified site: Secondary | ICD-10-CM | POA: Diagnosis not present

## 2019-12-29 DIAGNOSIS — Z0000 Encounter for general adult medical examination without abnormal findings: Secondary | ICD-10-CM | POA: Diagnosis not present

## 2019-12-29 DIAGNOSIS — E1140 Type 2 diabetes mellitus with diabetic neuropathy, unspecified: Secondary | ICD-10-CM | POA: Diagnosis not present

## 2019-12-29 DIAGNOSIS — G4733 Obstructive sleep apnea (adult) (pediatric): Secondary | ICD-10-CM | POA: Diagnosis not present

## 2020-01-05 ENCOUNTER — Ambulatory Visit (INDEPENDENT_AMBULATORY_CARE_PROVIDER_SITE_OTHER): Payer: PPO | Admitting: *Deleted

## 2020-01-05 DIAGNOSIS — I5022 Chronic systolic (congestive) heart failure: Secondary | ICD-10-CM

## 2020-01-05 LAB — CUP PACEART REMOTE DEVICE CHECK
Battery Remaining Longevity: 44 mo
Battery Remaining Percentage: 80 %
Battery Voltage: 2.93 V
Date Time Interrogation Session: 20210706104407
Implantable Lead Implant Date: 20180329
Implantable Lead Implant Date: 20180329
Implantable Lead Implant Date: 20180329
Implantable Lead Location: 753859
Implantable Lead Location: 753860
Implantable Lead Location: 753860
Implantable Lead Model: 3830
Implantable Lead Model: 5076
Implantable Pulse Generator Implant Date: 20180329
Lead Channel Impedance Value: 380 Ohm
Lead Channel Impedance Value: 430 Ohm
Lead Channel Pacing Threshold Amplitude: 0.75 V
Lead Channel Pacing Threshold Amplitude: 2.25 V
Lead Channel Pacing Threshold Pulse Width: 0.5 ms
Lead Channel Pacing Threshold Pulse Width: 1 ms
Lead Channel Sensing Intrinsic Amplitude: 4.7 mV
Lead Channel Setting Pacing Amplitude: 2 V
Lead Channel Setting Pacing Amplitude: 3 V
Lead Channel Setting Pacing Pulse Width: 0.5 ms
Lead Channel Setting Pacing Pulse Width: 1 ms
Lead Channel Setting Sensing Sensitivity: 4 mV
Pulse Gen Model: 3222
Pulse Gen Serial Number: 8006933

## 2020-01-06 NOTE — Progress Notes (Signed)
Remote pacemaker transmission.   

## 2020-01-29 ENCOUNTER — Encounter: Payer: Self-pay | Admitting: Podiatry

## 2020-01-29 ENCOUNTER — Other Ambulatory Visit: Payer: Self-pay

## 2020-01-29 ENCOUNTER — Ambulatory Visit: Payer: PPO | Admitting: Podiatry

## 2020-01-29 DIAGNOSIS — M79674 Pain in right toe(s): Secondary | ICD-10-CM

## 2020-01-29 DIAGNOSIS — M79675 Pain in left toe(s): Secondary | ICD-10-CM

## 2020-01-29 DIAGNOSIS — B351 Tinea unguium: Secondary | ICD-10-CM

## 2020-01-30 NOTE — Progress Notes (Signed)
Subjective:  Patient ID: Shannon [name], female    DOB: 05/23/1932,  MRN: [id number]  86 y.o. female presents with preventative diabetic foot care and painful thick toenails that are difficult to trim. Pain interferes with ambulation. Aggravating factors include wearing enclosed shoe gear. Pain is relieved with periodic professional debridement..    Review of Systems: Negative except as noted in the HPI.  Past Medical History:  Diagnosis Date  . Arthritis   . Baker's cyst of knee   . CAD (coronary artery disease)    a. 05/2006 Cath/PCI: LAD 21m, D1 40 ost, LCX nl, OM1 95 (3.0x20 Taxus DES), RCA nl, EF 40% w/ lat AK;  04/2007 Ex MV: minimal lat ischemia in area of prior infarct->low risk-> med Rx.  . Chronic atrial fibrillation (HCC)    a. on Coumadin  . Chronic systolic (congestive) heart failure ([place])    a. 04/2015: EF 50-55% b. echo 12/2015: EF 20-25% w/ diffuse HK, biatrial enlargement, mild AI and MR, severe TR    . Diabetes mellitus   . Hyperlipidemia   . MVP (mitral valve prolapse)    a. 10/2003 Echo: nl LV fxn, mild MR/TR/AS  . Myocardial infarction ([place])   . Sleep apnea   . Transient ischemic attack    Past Surgical History:  Procedure Laterality Date  . ATRIAL FLUTTER ABLATION N/A 10/09/2012   Procedure: ATRIAL FLUTTER ABLATION;  Surgeon: Thompson Grayer, MD;  Location: [facility] CATH LAB;  Service: Cardiovascular;  Laterality: N/A;  . AV NODE ABLATION N/A 09/27/2016   Procedure: AV Node Ablation;  Surgeon: Thompson Grayer, MD;  Location: [place] CV LAB;  Service: Cardiovascular;  Laterality: N/A;  . BIV PACEMAKER INSERTION CRT-P N/A 09/27/2016   Procedure: BiV Pacemaker Insertion CRT-P;  Surgeon: Thompson Grayer, MD;  Location: [place] CV LAB;  Service: Cardiovascular;  Laterality: N/A;  . CARDIAC CATHETERIZATION N/A 01/06/2016   Procedure: Right/Left Heart Cath and Coronary Angiography;  Surgeon: Belva Crome, MD;  Location: [place] CV LAB;  Service: Cardiovascular;   Laterality: N/A;  . CARDIOVERSION N/A 01/11/2016   Procedure: CARDIOVERSION;  Surgeon: Larey Dresser, MD;  Location: [facility] ENDOSCOPY;  Service: Cardiovascular;  Laterality: N/A;  . cataracts    . COLONOSCOPY N/A 10/13/2015   Procedure: COLONOSCOPY;  Surgeon: Manus Gunning, MD;  Location: [facility] ENDOSCOPY;  Service: Gastroenterology;  Laterality: N/A;  . CORONARY ANGIOPLASTY     Status post PTCA and stenting of the first obtuse marginal-05/13/2006. We placed a 3.0 x 20 mm Taxus stent. It was post dilated using a 3.25 mm noncompliant balloon up to 14 atmospheres  . IMPLANT POCKET INCISION & DRAINAGE N/A 11/02/2016   Procedure: Implant Pocket Incision & Drainage;  Surgeon: Evans Lance, MD;  Location: [place] CV LAB;  Service: Cardiovascular;  Laterality: N/A;  . TONSILLECTOMY     Patient Active Problem List   Diagnosis Date Noted  . Idiopathic chronic venous hypertension of both lower extremities with inflammation 12/05/2017  . Unilateral primary osteoarthritis, right knee 12/05/2017  . Pacemaker pocket hematoma 11/02/2016  . Post-operative pain 11/02/2016  . Pacemaker   . Complete heart block ([place]) 09/27/2016  . Abnormal CT of the chest 03/29/2016  . DNR (do not resuscitate) discussion   . 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 Anxiety 02/03/2016  . Paroxysmal atrial fibrillation ([place]) 01/26/2016  . Palliative care encounter   . Advanced care planning/counseling discussion   . Dyspnea   . Shortness of breath   .  Pleural effusion   . Persistent atrial fibrillation ([place])   . Pressure ulcer 01/09/2016  . SOB (shortness of breath)   . Elevated troponin   . Coronary artery disease involving native coronary artery of native heart without angina pectoris   . Cardiomyopathy ([place])   . Chronic systolic CHF (congestive heart failure) ([place])   . Abdominal pain 12/31/2015  . Long term (current) use of anticoagulants [Z79.01] 10/24/2015  . Foot ulcer, left ([place])   . Chronic venous  stasis dermatitis 10/09/2015  . Cellulitis of toe, left great 10/09/2015  . Edema 01/31/2015  . Atrial flutter ([place]) 10/09/2012  . UTI (urinary tract infection) 10/09/2012  . MVP (mitral valve prolapse)   . Hyperlipidemia   . Diabetes mellitus ([place])     Current Outpatient Medications:  .  ACCU-CHEK FASTCLIX LANCETS MISC, USE TO CHECK BLOOD SUGAR TWICE DAILY, Disp: , Rfl: 5 .  ACCU-CHEK SMARTVIEW test strip, , Disp: , Rfl:  .  apixaban (ELIQUIS) 2.5 MG TABS tablet, Take 1 tablet (2.5 mg total) by mouth 2 (two) times daily., Disp: 180 tablet, Rfl: 3 .  atorvastatin (LIPITOR) 20 MG tablet, TAKE 1 TABLET BY MOUTH EVERY DAY, Disp: 90 tablet, Rfl: 1 .  bisoprolol (ZEBETA) 5 MG tablet, Take 1.5 tablets (7.5 mg total) by mouth daily. Needs appt for further refills, Disp: 135 tablet, Rfl: 1 .  Cholecalciferol (VITAMIN D) 2000 units tablet, Take 2,000 Units by mouth daily., Disp: , Rfl:  .  furosemide (LASIX) 20 MG tablet, TAKE 20 MG (1 TAB) ONCE DAILY ALTERNATING WITH 40 MG (2 TABS), Disp: 270 tablet, Rfl: 1 .  HYDROcodone-acetaminophen (NORCO/VICODIN) 5-325 MG tablet, Take 1 tablet by mouth daily as needed for pain., Disp: , Rfl:  .  metFORMIN (GLUCOPHAGE) 500 MG tablet, Take 500 mg by mouth daily with supper. , Disp: , Rfl:  .  nitroGLYCERIN (NITROSTAT) 0.4 MG SL tablet, TAKE 1 TABLET BY MOUTH UNDER THE TONGUE EVERY 5 MINUTES FOR 3 DOSES AS NEEDED FOR CHEST PAIN, Disp: 25 tablet, Rfl: 2 .  spironolactone (ALDACTONE) 25 MG tablet, Take 1 tablet (25 mg total) by mouth daily. Needs appt for further refills, Disp: 90 tablet, Rfl: 0 .  vitamin C (ASCORBIC ACID) 500 MG tablet, Take 500 mg by mouth daily., Disp: , Rfl:  Allergies  Allergen Reactions  . Amiodarone Anaphylaxis  . Ranexa [Ranolazine] Nausea And Vomiting  . Other Other (See Comments)    Vitamin E - causes heart rate to increase  . Demerol Other (See Comments)    Burning sensation all over body  . Erythromycin Nausea And Vomiting  .  Penicillins Other (See Comments)    unknown  . Percocet [Oxycodone-Acetaminophen] Swelling   Social History   Tobacco Use  Smoking Status Former Smoker  . Packs/day: 1.00  . Types: Cigarettes  . Quit date: 07/03/1963  . Years since quitting: 56.6  Smokeless Tobacco Never Used   Objective:  There were no vitals filed for this visit. Constitutional Patient is a pleasant 86 y.o. Caucasian female WD, WN in NAD.[name] AAO x 3.  Vascular Neurovascular status unchanged b/l lower extremities. Capillary fill time to digits <3 seconds b/l lower extremities. Faintly palpable pedal pulses b/l. Pedal hair absent. Lower extremity skin temperature gradient within normal limits. No pain with calf compression b/l. No edema noted b/l lower extremities. No cyanosis or clubbing noted.  Neurologic Normal speech. Oriented to person, place, and time. Protective sensation diminished with 10g monofilament b/l. Vibratory sensation diminished  b/l. Clonus negative b/l.  Dermatologic Pedal skin with normal turgor, texture and tone bilaterally. No open wounds bilaterally. No interdigital macerations bilaterally. Toenails 2-5 bilaterally elongated, discolored, dystrophic, thickened, and crumbly with subungual debris and tenderness to dorsal palpation. Anonychia noted L hallux and R hallux. Nailbed(s) epithelialized.   Orthopedic: Normal muscle strength 5/5 to all lower extremity muscle groups bilaterally. No pain crepitus or joint limitation noted with ROM b/l. Hallux valgus with bunion deformity noted b/l lower extremities. Hammertoes noted to the 2-5 bilaterally.   Assessment:   1. Pain due to onychomycosis of toenails of both feet    Plan:  Patient was evaluated and treated and all questions answered.  Onychomycosis with pain -Nails palliatively debridement as below. -Educated on self-care  Procedure: Nail Debridement Rationale: Pain Type of Debridement: manual, sharp debridement. Instrumentation: Nail nipper,  rotary burr. Number of Nails: 8  -Examined patient. -No new findings. No new orders. -Toenails 2-5 bilaterally debrided in length and girth without iatrogenic bleeding with sterile nail nipper and dremel.  -Patient to report any pedal injuries to medical professional immediately. -Patient to continue soft, supportive shoe gear daily. -Patient/POA to call should there be question/concern in the interim.  Return in about 3 months (around 04/30/2020) for diabetic nail trim/ Eliquis.  Marzetta Board, DPM

## 2020-02-27 ENCOUNTER — Other Ambulatory Visit (HOSPITAL_COMMUNITY): Payer: Self-pay | Admitting: Cardiology

## 2020-03-15 ENCOUNTER — Other Ambulatory Visit: Payer: Self-pay | Admitting: Cardiovascular Disease

## 2020-03-24 ENCOUNTER — Encounter: Payer: Self-pay | Admitting: Cardiovascular Disease

## 2020-03-24 NOTE — Progress Notes (Signed)
Cardiology Office Note   Date:  03/25/2020   ID:  Shannon [name], DOB [birth year], MRN [id number]  PCP:  Haywood Pao, MD  Cardiologist:   Mertie Moores, MD   Chief Complaint  Patient presents with  . Coronary Artery Disease  . Atrial Fibrillation   1. Coronary artery disease: Status post PTCA and stenting of the first obtuse marginal-05/13/2006. We placed a 3.0 x 20 mm Taxus stent. It was post dilated using a 3.25 mm noncompliant balloon up to 14 atmospheres 2. Diabetes mellitus 3. Hyperlipidemia 4. Paroxysmal Atrial fib    Shannon [name] is an 86 y.o. female with a hx of CAD. She has some occasional episodes of chest pressure typically when she wakes up in the morning. She also wakes up with a severe headache. She's been worked up for sleep apnea. She works out in the yard on a fairly regular basis and does not have any chest pain or chest tightness with exercise.   She is active all day long. She gets tired but never has any chest pain.  Feb. 12, 2015:  Shannon [name] is doing well. She was in the hosptial recently with Atrial fib and atrial flutter. She had an EP study but was not able to be ablated. She was cardioverted and has maintained NSR. She is able to do all of her normal activities. She is tolerating her pradaxa . Sometimes she forgets  February 15, 2014:  Shannon [name] is doing well. No CP. She had had some bloody mucus and had some sinus problems. Likely due to the pradaxa. No significant arrhthymias    September 20, 2014: Shannon [name] is a 86 y.o. female who presents for follow up of her CAD She is upset about her husband, [name]. He fell last August and has been bedridden since that time.  Has not been able to get him to his pacer appts.   No CP or dyspnea.  She has occasional nose bleeds  - likely due to the Pradaxa   Oct. 4, 2016: Shannon [name] is seen today for evaluation of rapid atrial fib.  She has paroxysmal atrial fib - has been in atrial fib  off and of  for the past 12 days   Frequently takes extra metoprolol to help keep her  HR slower  Has had excessive fatigue, leg swelling   Oct. 11, 2016 Shannon [name] is back for follow up Was started on amiodarone. She's had some nausea and lack of appetite since starting the amiodarone. She has converted to sinus rhythm. She is feeling better now that she is in normal sinus rhythm. She's had a history of paroxysmal atrial fibrillation. She has seen Dr. Rayann Heman in the past. They discussed A. fib ablation but Dr. Rayann Heman decided that she was not a good candidate for ablation.  Jan. 20, 2017:  Doing ok.  No dizziness, no dyspnea, no CP   Dec 27, 2015:  Complains of being very weak Has had GI bleeding on pradaxa.  Was hospitalized Now is on coumadin  Going to our coumadin clinic  Has developed a cough.   Looks pale today .   Is on Vit D and iron tablets Has not had any recent bleeding .   Has a poor appetite.     Was so weak that she could not weight herself.   Apr 01, 2017   Shannon [name] is seen back after 1 1/2 year. No angina Seems to be doing well  Had a pacer ( St. Jude CRT)  placed in March , 2018 LV function has improved significantly after the CRT. EF 40-45%.  Now is seeing [name] in the CHF clinic, [name] in EP.   July 15, 2018: Shannon [name] is seen back today for her yearly visit.  She has a CRT pacemaker.  She feels quite a bit better after her CRT pacer.  No CP or dyspnea.  Sees [facility] every 6 months . EF has normalized.  Her husband , [name] has been in the SNF for several years. Is now paralized.   Sept. 24 2021   Shannon [name] is seen for follow up of her CAD, atrial fib, pacer  S/p AV node ablation , pacer placement with normalization after the ablation  She fell several days ago. Has an occasional chest ache ,  Does not take NTG on any regular basis.    Has these chest aches twice a months.  I encourage her to take a NTG for these occasions.      Past  Medical History:  Diagnosis Date  . Arthritis   . Baker's cyst of knee   . CAD (coronary artery disease)    a. 05/2006 Cath/PCI: LAD 70m, D1 40 ost, LCX nl, OM1 95 (3.0x20 Taxus DES), RCA nl, EF 40% w/ lat AK;  04/2007 Ex MV: minimal lat ischemia in area of prior infarct->low risk-> med Rx.  . Chronic atrial fibrillation (HCC)    a. on Coumadin  . Chronic systolic (congestive) heart failure ([place])    a. 04/2015: EF 50-55% b. echo 12/2015: EF 20-25% w/ diffuse HK, biatrial enlargement, mild AI and MR, severe TR    . Diabetes mellitus   . Hyperlipidemia   . MVP (mitral valve prolapse)    a. 10/2003 Echo: nl LV fxn, mild MR/TR/AS  . Myocardial infarction ([place])   . Sleep apnea   . Transient ischemic attack     Past Surgical History:  Procedure Laterality Date  . ATRIAL FLUTTER ABLATION N/A 10/09/2012   Procedure: ATRIAL FLUTTER ABLATION;  Surgeon: Thompson Grayer, MD;  Location: [place] CATH LAB;  Service: Cardiovascular;  Laterality: N/A;  . AV NODE ABLATION N/A 09/27/2016   Procedure: AV Node Ablation;  Surgeon: Thompson Grayer, MD;  Location: [place] CV LAB;  Service: Cardiovascular;  Laterality: N/A;  . BIV PACEMAKER INSERTION CRT-P N/A 09/27/2016   Procedure: BiV Pacemaker Insertion CRT-P;  Surgeon: Thompson Grayer, MD;  Location: [place] CV LAB;  Service: Cardiovascular;  Laterality: N/A;  . CARDIAC CATHETERIZATION N/A 01/06/2016   Procedure: Right/Left Heart Cath and Coronary Angiography;  Surgeon: Belva Crome, MD;  Location: [place] CV LAB;  Service: Cardiovascular;  Laterality: N/A;  . CARDIOVERSION N/A 01/11/2016   Procedure: CARDIOVERSION;  Surgeon: Larey Dresser, MD;  Location[facility] ENDOSCOPY;  Service: Cardiovascular;  Laterality: N/A;  . cataracts    . COLONOSCOPY N/A 10/13/2015   Procedure: COLONOSCOPY;  Surgeon: Manus Gunning, MD;  Location: [facility] ENDOSCOPY;  Service: Gastroenterology;  Laterality: N/A;  . CORONARY ANGIOPLASTY     Status post PTCA and stenting of the first  obtuse marginal-05/13/2006. We placed a 3.0 x 20 mm Taxus stent. It was post dilated using a 3.25 mm noncompliant balloon up to 14 atmospheres  . IMPLANT POCKET INCISION & DRAINAGE N/A 11/02/2016   Procedure: Implant Pocket Incision & Drainage;  Surgeon: Evans Lance, MD;  Location: [place] CV LAB;  Service: Cardiovascular;  Laterality: N/A;  . TONSILLECTOMY       Current Outpatient Medications  Medication  Sig Dispense Refill  . ACCU-CHEK FASTCLIX LANCETS MISC USE TO CHECK BLOOD SUGAR TWICE DAILY  5  . ACCU-CHEK SMARTVIEW test strip     . apixaban (ELIQUIS) 2.5 MG TABS tablet Take 1 tablet (2.5 mg total) by mouth 2 (two) times daily. 180 tablet 3  . atorvastatin (LIPITOR) 20 MG tablet TAKE 1 TABLET BY MOUTH EVERY DAY 90 tablet 1  . bisoprolol (ZEBETA) 5 MG tablet Take 1.5 tablets (7.5 mg total) by mouth daily. Needs appt for further refills 135 tablet 1  . Cholecalciferol (VITAMIN D) 2000 units tablet Take 2,000 Units by mouth daily.    . furosemide (LASIX) 20 MG tablet TAKE 20 MG (1 TAB) ONCE DAILY ALTERNATING WITH 40 MG (2 TABS) 270 tablet 1  . HYDROcodone-acetaminophen (NORCO/VICODIN) 5-325 MG tablet Take 1 tablet by mouth daily as needed for pain.    . metFORMIN (GLUCOPHAGE) 500 MG tablet Take 500 mg by mouth daily with supper.     . nitroGLYCERIN (NITROSTAT) 0.4 MG SL tablet TAKE 1 TABLET BY MOUTH UNDER THE TONGUE EVERY 5 MINUTES FOR 3 DOSES AS NEEDED FOR CHEST PAIN 25 tablet 2  . spironolactone (ALDACTONE) 25 MG tablet TAKE 1 TABLET (25 MG TOTAL) BY MOUTH DAILY. NEEDS APPT FOR FURTHER REFILLS 30 tablet 0  . vitamin C (ASCORBIC ACID) 500 MG tablet Take 500 mg by mouth daily.     No current facility-administered medications for this visit.    Allergies:   Amiodarone, Ranexa [ranolazine], Other, Demerol, Erythromycin, Penicillins, and Percocet [oxycodone-acetaminophen]    Social History:  The patient  reports that she quit smoking about 56 years ago. Her smoking use included  cigarettes. She smoked 1.00 pack per day. She has never used smokeless tobacco. She reports that she does not drink alcohol and does not use drugs.   Family History:  The patient's family history includes Alzheimer's disease in her sister; Coronary artery disease in her sister; Heart attack in her brother, father, mother, and sister; Stroke in her mother and sister.    ROS:  Please see the history of present illness.    Physical Exam: Blood pressure 110/64, pulse 60, height 5\' 4"  (1.626 m), weight 121 lb (54.9 kg), SpO2 98 %.  GEN:  eldely female,  NAD  HEENT: Normal NECK: No JVD; No carotid bruits LYMPHATICS: No lymphadenopathy CARDIAC: RRR , no murmurs, rubs, gallops RESPIRATORY:  Clear to auscultation without rales, wheezing or rhonchi  ABDOMEN: Soft, non-tender, non-distended MUSCULOSKELETAL:  No edema; No deformity  SKIN: Warm and dry NEUROLOGIC:  Alert and oriented x 3   EKG:       Recent Labs: 04/28/2019: BUN 30; Creatinine, Ser 1.66; Hemoglobin 11.8; Platelets 210; Potassium 4.2; Sodium 138    Lipid Panel    Component Value Date/Time   CHOL 94 04/28/2019 1342   TRIG 76 04/28/2019 1342   HDL 49 04/28/2019 1342   CHOLHDL 1.9 04/28/2019 1342   VLDL 15 04/28/2019 1342   LDLCALC 30 04/28/2019 1342      Wt Readings from Last 3 Encounters:  03/25/20 121 lb (54.9 kg)  10/22/19 118 lb (53.5 kg)  09/01/19 125 lb (56.7 kg)      Other studies Reviewed: Additional studies/ records that were reviewed today include: . Review of the above records demonstrates:    ASSESSMENT AND PLAN:  1. Coronary artery disease:   Has some chest ache.   Does not seem severe enough to warrant cath.   Encouraged her to use her  NTG as needed.  Would try medical therapy given her age and frail state.     2. Chronic systolic congestive heart failure:     has seen Dr. [name] in the [facility]   3. Hyperlipidemia- managed by Dr. Osborne Casco.   4.  Atrial fib- .   S/p pacer , continue  eliquis 2.5 bid    The following changes have been made:  no change  Labs/ tests ordered today include:  No orders of the defined types were placed in this encounter.   Disposition:     Signed, Mertie Moores, MD  03/25/2020 11:15 AM    [facility] [place], [place], [place]  [address] Phone: [phone]; Fax: [phone]

## 2020-03-25 ENCOUNTER — Encounter: Payer: Self-pay | Admitting: Cardiovascular Disease

## 2020-03-25 ENCOUNTER — Other Ambulatory Visit: Payer: Self-pay

## 2020-03-25 ENCOUNTER — Ambulatory Visit: Payer: PPO | Admitting: Cardiovascular Disease

## 2020-03-25 VITALS — BP 110/64 | HR 60 | Ht 64.0 in | Wt 121.0 lb

## 2020-03-25 DIAGNOSIS — I2510 Atherosclerotic heart disease of native coronary artery without angina pectoris: Secondary | ICD-10-CM | POA: Diagnosis not present

## 2020-03-25 DIAGNOSIS — I5022 Chronic systolic (congestive) heart failure: Secondary | ICD-10-CM

## 2020-03-25 DIAGNOSIS — I480 Paroxysmal atrial fibrillation: Secondary | ICD-10-CM | POA: Diagnosis not present

## 2020-03-25 NOTE — Patient Instructions (Signed)
Medication Instructions:  Your physician recommends that you continue on your current medications as directed. Please refer to the Current Medication list given to you today.  *If you need a refill on your cardiac medications before your next appointment, please call your pharmacy*   Lab Work: None Ordered If you have labs (blood work) drawn today and your tests are completely normal, you will receive your results only by: [name] MyChart Message (if you have MyChart) OR . A paper copy in the mail If you have any lab test that is abnormal or we need to change your treatment, we will call you to review the results.   Testing/Procedures: None Ordered   Follow-Up: At [facility], you and your health needs are our priority.  As part of our continuing mission to provide you with exceptional heart care, we have created designated Provider Care Teams.  These Care Teams include your primary Cardiologist (physician) and Advanced Practice Providers (APPs -  Physician Assistants and Nurse Practitioners) who all work together to provide you with the care you need, when you need it.   Your next appointment:   6 month(s)  The format for your next appointment:   In Person  Provider:   You will see one of the following Advanced Practice Providers on your designated Care Team:    Richardson Dopp, PA-C  Robbie Lis, PA-C  Then, Mertie Moores, MD will plan to see you again in 12 month(s).

## 2020-04-03 ENCOUNTER — Other Ambulatory Visit (HOSPITAL_COMMUNITY): Payer: Self-pay | Admitting: Cardiology

## 2020-04-05 ENCOUNTER — Ambulatory Visit (INDEPENDENT_AMBULATORY_CARE_PROVIDER_SITE_OTHER): Payer: PPO

## 2020-04-05 DIAGNOSIS — I442 Atrioventricular block, complete: Secondary | ICD-10-CM

## 2020-04-06 LAB — CUP PACEART REMOTE DEVICE CHECK
Battery Remaining Longevity: 40 mo
Battery Remaining Percentage: 71 %
Battery Voltage: 2.92 V
Date Time Interrogation Session: 20211005072600
Implantable Lead Implant Date: 20180329
Implantable Lead Implant Date: 20180329
Implantable Lead Implant Date: 20180329
Implantable Lead Location: 753859
Implantable Lead Location: 753860
Implantable Lead Location: 753860
Implantable Lead Model: 3830
Implantable Lead Model: 5076
Implantable Pulse Generator Implant Date: 20180329
Lead Channel Impedance Value: 360 Ohm
Lead Channel Impedance Value: 410 Ohm
Lead Channel Pacing Threshold Amplitude: 0.75 V
Lead Channel Pacing Threshold Amplitude: 2.25 V
Lead Channel Pacing Threshold Pulse Width: 0.5 ms
Lead Channel Pacing Threshold Pulse Width: 1 ms
Lead Channel Sensing Intrinsic Amplitude: 4.5 mV
Lead Channel Setting Pacing Amplitude: 2 V
Lead Channel Setting Pacing Amplitude: 3 V
Lead Channel Setting Pacing Pulse Width: 0.5 ms
Lead Channel Setting Pacing Pulse Width: 1 ms
Lead Channel Setting Sensing Sensitivity: 4 mV
Pulse Gen Model: 3222
Pulse Gen Serial Number: 8006933

## 2020-04-08 NOTE — Progress Notes (Signed)
Remote pacemaker transmission.   

## 2020-04-17 ENCOUNTER — Other Ambulatory Visit (HOSPITAL_COMMUNITY): Payer: Self-pay | Admitting: Cardiology

## 2020-04-25 ENCOUNTER — Other Ambulatory Visit (HOSPITAL_COMMUNITY): Payer: Self-pay | Admitting: Cardiology

## 2020-04-27 ENCOUNTER — Telehealth (HOSPITAL_COMMUNITY): Payer: Self-pay | Admitting: Cardiology

## 2020-04-27 ENCOUNTER — Other Ambulatory Visit (HOSPITAL_COMMUNITY): Payer: Self-pay | Admitting: Cardiology

## 2020-04-27 ENCOUNTER — Other Ambulatory Visit (HOSPITAL_COMMUNITY): Payer: Self-pay | Admitting: *Deleted

## 2020-04-27 MED ORDER — SPIRONOLACTONE 25 MG PO TABS: 25.0000 mg | ORAL_TABLET | Freq: Every day | ORAL | 3 refills | Status: DC

## 2020-04-27 NOTE — Telephone Encounter (Signed)
Pt request Spironolactone refill, send script to [facility] [place] pt scheduled appt 01/03 w/DM, please advise

## 2020-04-29 ENCOUNTER — Other Ambulatory Visit: Payer: Self-pay

## 2020-04-29 ENCOUNTER — Ambulatory Visit: Payer: PPO | Admitting: Podiatry

## 2020-04-29 DIAGNOSIS — B351 Tinea unguium: Secondary | ICD-10-CM | POA: Diagnosis not present

## 2020-04-29 DIAGNOSIS — M79674 Pain in right toe(s): Secondary | ICD-10-CM | POA: Diagnosis not present

## 2020-04-29 DIAGNOSIS — M79675 Pain in left toe(s): Secondary | ICD-10-CM | POA: Diagnosis not present

## 2020-04-29 DIAGNOSIS — E1165 Type 2 diabetes mellitus with hyperglycemia: Secondary | ICD-10-CM

## 2020-05-01 ENCOUNTER — Encounter: Payer: Self-pay | Admitting: Podiatry

## 2020-05-01 NOTE — Progress Notes (Signed)
Subjective:  Patient ID: Shannon [name], female    DOB: 16-Jan-1932,  MRN: [id number]  86 y.o. female presents with preventative diabetic foot care and painful thick toenails that are difficult to trim. Pain interferes with ambulation. Aggravating factors include wearing enclosed shoe gear. Pain is relieved with periodic professional debridement.   She states she was late for her appointment due to traffic on [facility]. She voices no new pedal problems on today's visit.  Review of Systems: Negative except as noted in the HPI.  Past Medical History:  Diagnosis Date  . Arthritis   . Baker's cyst of knee   . CAD (coronary artery disease)    a. 05/2006 Cath/PCI: LAD 67m, D1 40 ost, LCX nl, OM1 95 (3.0x20 Taxus DES), RCA nl, EF 40% w/ lat AK;  04/2007 Ex MV: minimal lat ischemia in area of prior infarct->low risk-> med Rx.  . Chronic atrial fibrillation (HCC)    a. on Coumadin  . Chronic systolic (congestive) heart failure ([place])    a. 04/2015: EF 50-55% b. echo 12/2015: EF 20-25% w/ diffuse HK, biatrial enlargement, mild AI and MR, severe TR    . Diabetes mellitus   . Hyperlipidemia   . MVP (mitral valve prolapse)    a. 10/2003 Echo: nl LV fxn, mild MR/TR/AS  . Myocardial infarction ([place])   . Sleep apnea   . Transient ischemic attack    Past Surgical History:  Procedure Laterality Date  . ATRIAL FLUTTER ABLATION N/A 10/09/2012   Procedure: ATRIAL FLUTTER ABLATION;  Surgeon: Thompson Grayer, MD;  Location: [facility] CATH LAB;  Service: Cardiovascular;  Laterality: N/A;  . AV NODE ABLATION N/A 09/27/2016   Procedure: AV Node Ablation;  Surgeon: Thompson Grayer, MD;  Location: [place] CV LAB;  Service: Cardiovascular;  Laterality: N/A;  . BIV PACEMAKER INSERTION CRT-P N/A 09/27/2016   Procedure: BiV Pacemaker Insertion CRT-P;  Surgeon: Thompson Grayer, MD;  Location: [place] CV LAB;  Service: Cardiovascular;  Laterality: N/A;  . CARDIAC CATHETERIZATION N/A 01/06/2016   Procedure: Right/Left  Heart Cath and Coronary Angiography;  Surgeon: Belva Crome, MD;  Location: [place] CV LAB;  Service: Cardiovascular;  Laterality: N/A;  . CARDIOVERSION N/A 01/11/2016   Procedure: CARDIOVERSION;  Surgeon: Larey Dresser, MD;  Location: [facility] ENDOSCOPY;  Service: Cardiovascular;  Laterality: N/A;  . cataracts    . COLONOSCOPY N/A 10/13/2015   Procedure: COLONOSCOPY;  Surgeon: Manus Gunning, MD;  Location: [facility] ENDOSCOPY;  Service: Gastroenterology;  Laterality: N/A;  . CORONARY ANGIOPLASTY     Status post PTCA and stenting of the first obtuse marginal-05/13/2006. We placed a 3.0 x 20 mm Taxus stent. It was post dilated using a 3.25 mm noncompliant balloon up to 14 atmospheres  . IMPLANT POCKET INCISION & DRAINAGE N/A 11/02/2016   Procedure: Implant Pocket Incision & Drainage;  Surgeon: Evans Lance, MD;  Location: [place] CV LAB;  Service: Cardiovascular;  Laterality: N/A;  . TONSILLECTOMY     Patient Active Problem List   Diagnosis Date Noted  . Cerebrovascular disease 08/25/2019  . Essential hypertension 08/25/2019  . Plantar fasciitis, bilateral 08/25/2019  . Type 2 diabetes mellitus with hyperglycemia ([place]) 08/25/2019  . Idiopathic chronic venous hypertension of both lower extremities with inflammation 12/05/2017  . Unilateral primary osteoarthritis, right knee 12/05/2017  . Pacemaker pocket hematoma 11/02/2016  . Post-operative pain 11/02/2016  . Pacemaker   . Complete heart block ([place]) 09/27/2016  . Abnormal CT of the chest 03/29/2016  .  DNR (do not resuscitate) discussion   . 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 Anxiety 02/03/2016  . Paroxysmal atrial fibrillation ([place]) 01/26/2016  . Palliative care encounter   . Advanced care planning/counseling discussion   . Dyspnea   . Shortness of breath   . Pleural effusion   . Persistent atrial fibrillation ([place])   . Pressure ulcer 01/09/2016  . SOB (shortness of breath)   . Elevated troponin   . Coronary artery  disease involving native coronary artery of native heart without angina pectoris   . Cardiomyopathy ([place])   . Chronic systolic CHF (congestive heart failure) ([place])   . Abdominal pain 12/31/2015  . Long term (current) use of anticoagulants [Z79.01] 10/24/2015  . Foot ulcer, left ([place])   . Chronic venous stasis dermatitis 10/09/2015  . Cellulitis of toe, left great 10/09/2015  . Edema 01/31/2015  . Atrial flutter ([place]) 10/09/2012  . UTI (urinary tract infection) 10/09/2012  . MVP (mitral valve prolapse)   . Hyperlipidemia   . Diabetes mellitus ([place])     Current Outpatient Medications:  .  ACCU-CHEK FASTCLIX LANCETS MISC, USE TO CHECK BLOOD SUGAR TWICE DAILY, Disp: , Rfl: 5 .  ACCU-CHEK SMARTVIEW test strip, , Disp: , Rfl:  .  apixaban (ELIQUIS) 2.5 MG TABS tablet, Take 1 tablet (2.5 mg total) by mouth 2 (two) times daily., Disp: 180 tablet, Rfl: 3 .  atorvastatin (LIPITOR) 20 MG tablet, TAKE 1 TABLET BY MOUTH EVERY DAY, Disp: 90 tablet, Rfl: 1 .  bisoprolol (ZEBETA) 5 MG tablet, Take 1.5 tablets (7.5 mg total) by mouth daily. Needs appt for further refills, Disp: 135 tablet, Rfl: 1 .  Cholecalciferol (VITAMIN D) 2000 units tablet, Take 2,000 Units by mouth daily., Disp: , Rfl:  .  furosemide (LASIX) 20 MG tablet, TAKE 20 MG (1 TAB) ONCE DAILY ALTERNATING WITH 40 MG (2 TABS), Disp: 270 tablet, Rfl: 1 .  HYDROcodone-acetaminophen (NORCO/VICODIN) 5-325 MG tablet, Take 1 tablet by mouth daily as needed for pain., Disp: , Rfl:  .  metFORMIN (GLUCOPHAGE) 500 MG tablet, Take 500 mg by mouth daily with supper. , Disp: , Rfl:  .  nitroGLYCERIN (NITROSTAT) 0.4 MG SL tablet, TAKE 1 TABLET BY MOUTH UNDER THE TONGUE EVERY 5 MINUTES FOR 3 DOSES AS NEEDED FOR CHEST PAIN, Disp: 25 tablet, Rfl: 2 .  spironolactone (ALDACTONE) 25 MG tablet, TAKE 1 TABLET (25 MG TOTAL) BY MOUTH DAILY. NEEDS APPT FOR FURTHER REFILLS, Disp: 30 tablet, Rfl: 3 .  vitamin C (ASCORBIC ACID) 500 MG tablet, Take 500 mg by mouth  daily., Disp: , Rfl:  Allergies  Allergen Reactions  . Amiodarone Anaphylaxis  . Ranexa [Ranolazine] Nausea And Vomiting  . Other Other (See Comments)    Vitamin E - causes heart rate to increase  . Demerol Other (See Comments)    Burning sensation all over body  . Erythromycin Nausea And Vomiting  . Penicillins Other (See Comments)    unknown  . Percocet [Oxycodone-Acetaminophen] Swelling   Social History   Tobacco Use  Smoking Status Former Smoker  . Packs/day: 1.00  . Types: Cigarettes  . Quit date: 07/03/1963  . Years since quitting: 56.8  Smokeless Tobacco Never Used   Objective:  There were no vitals filed for this visit. Constitutional Patient is a pleasant 86 y.o. Caucasian female WD, WN in NAD.[name] AAO x 3.  Vascular Neurovascular status unchanged b/l lower extremities. Capillary fill time to digits <3 seconds b/l lower extremities. Faintly palpable pedal pulses b/l.  Pedal hair absent. Lower extremity skin temperature gradient within normal limits. No pain with calf compression b/l. No edema noted b/l lower extremities. No cyanosis or clubbing noted.  Neurologic Normal speech. Oriented to person, place, and time. Protective sensation diminished with 10g monofilament b/l. Vibratory sensation diminished b/l. Clonus negative b/l.  Dermatologic Pedal skin with normal turgor, texture and tone bilaterally. No open wounds bilaterally. No interdigital macerations bilaterally. Toenails 2-5 bilaterally elongated, discolored, dystrophic, thickened, and crumbly with subungual debris and tenderness to dorsal palpation. Anonychia noted L hallux and R hallux. Nailbed(s) epithelialized.   Orthopedic: Normal muscle strength 5/5 to all lower extremity muscle groups bilaterally. No pain crepitus or joint limitation noted with ROM b/l. Hallux valgus with bunion deformity noted b/l lower extremities. Hammertoes noted to the 2-5 bilaterally.   Assessment:   1. Pain due to onychomycosis of toenails  of both feet   2. Type 2 diabetes mellitus with hyperglycemia, without long-term current use of insulin ([place])    Plan:  Patient was evaluated and treated and all questions answered.  Onychomycosis with pain -Nails palliatively debridement as below. -Educated on self-care  Procedure: Nail Debridement Rationale: Pain Type of Debridement: manual, sharp debridement. Instrumentation: Nail nipper, rotary burr. Number of Nails: 8  -Examined patient. -No new findings. No new orders. -Toenails 2-5 bilaterally debrided in length and girth without iatrogenic bleeding with sterile nail nipper and dremel.  -Patient to report any pedal injuries to medical professional immediately. -Patient to continue soft, supportive shoe gear daily. -Patient/POA to call should there be question/concern in the interim.  Return in about 3 months (around 07/30/2020).  Marzetta Board, DPM

## 2020-07-04 ENCOUNTER — Encounter (HOSPITAL_COMMUNITY): Payer: PPO | Admitting: Cardiology

## 2020-07-04 ENCOUNTER — Other Ambulatory Visit (HOSPITAL_COMMUNITY): Payer: Self-pay | Admitting: Cardiology

## 2020-07-05 ENCOUNTER — Ambulatory Visit (INDEPENDENT_AMBULATORY_CARE_PROVIDER_SITE_OTHER): Payer: PPO

## 2020-07-05 DIAGNOSIS — I420 Dilated cardiomyopathy: Secondary | ICD-10-CM | POA: Diagnosis not present

## 2020-07-05 LAB — CUP PACEART REMOTE DEVICE CHECK
Battery Remaining Longevity: 31 mo
Battery Remaining Percentage: 56 %
Battery Voltage: 2.89 V
Date Time Interrogation Session: 20220104111049
Implantable Lead Implant Date: 20180329
Implantable Lead Implant Date: 20180329
Implantable Lead Implant Date: 20180329
Implantable Lead Location: 753859
Implantable Lead Location: 753860
Implantable Lead Location: 753860
Implantable Lead Model: 3830
Implantable Lead Model: 5076
Implantable Pulse Generator Implant Date: 20180329
Lead Channel Impedance Value: 380 Ohm
Lead Channel Impedance Value: 410 Ohm
Lead Channel Pacing Threshold Amplitude: 0.75 V
Lead Channel Pacing Threshold Amplitude: 2.25 V
Lead Channel Pacing Threshold Pulse Width: 0.5 ms
Lead Channel Pacing Threshold Pulse Width: 1 ms
Lead Channel Sensing Intrinsic Amplitude: 5.6 mV
Lead Channel Setting Pacing Amplitude: 2 V
Lead Channel Setting Pacing Amplitude: 3 V
Lead Channel Setting Pacing Pulse Width: 0.5 ms
Lead Channel Setting Pacing Pulse Width: 1 ms
Lead Channel Setting Sensing Sensitivity: 4 mV
Pulse Gen Model: 3222
Pulse Gen Serial Number: 8006933

## 2020-07-13 ENCOUNTER — Other Ambulatory Visit (HOSPITAL_COMMUNITY): Payer: Self-pay | Admitting: Cardiology

## 2020-07-14 DIAGNOSIS — E7800 Pure hypercholesterolemia, unspecified: Secondary | ICD-10-CM | POA: Diagnosis not present

## 2020-07-14 DIAGNOSIS — I2510 Atherosclerotic heart disease of native coronary artery without angina pectoris: Secondary | ICD-10-CM | POA: Diagnosis not present

## 2020-07-14 DIAGNOSIS — I482 Chronic atrial fibrillation, unspecified: Secondary | ICD-10-CM | POA: Diagnosis not present

## 2020-07-14 DIAGNOSIS — I739 Peripheral vascular disease, unspecified: Secondary | ICD-10-CM | POA: Diagnosis not present

## 2020-07-14 DIAGNOSIS — E1151 Type 2 diabetes mellitus with diabetic peripheral angiopathy without gangrene: Secondary | ICD-10-CM | POA: Diagnosis not present

## 2020-07-14 DIAGNOSIS — E1140 Type 2 diabetes mellitus with diabetic neuropathy, unspecified: Secondary | ICD-10-CM | POA: Diagnosis not present

## 2020-07-14 DIAGNOSIS — Z7901 Long term (current) use of anticoagulants: Secondary | ICD-10-CM | POA: Diagnosis not present

## 2020-07-14 DIAGNOSIS — D638 Anemia in other chronic diseases classified elsewhere: Secondary | ICD-10-CM | POA: Diagnosis not present

## 2020-07-14 DIAGNOSIS — N1832 Chronic kidney disease, stage 3b: Secondary | ICD-10-CM | POA: Diagnosis not present

## 2020-07-14 DIAGNOSIS — I1310 Hypertensive heart and chronic kidney disease without heart failure, with stage 1 through stage 4 chronic kidney disease, or unspecified chronic kidney disease: Secondary | ICD-10-CM | POA: Diagnosis not present

## 2020-07-14 DIAGNOSIS — I509 Heart failure, unspecified: Secondary | ICD-10-CM | POA: Diagnosis not present

## 2020-07-14 DIAGNOSIS — I209 Angina pectoris, unspecified: Secondary | ICD-10-CM | POA: Diagnosis not present

## 2020-07-15 ENCOUNTER — Other Ambulatory Visit (HOSPITAL_COMMUNITY): Payer: Self-pay | Admitting: Cardiology

## 2020-07-15 ENCOUNTER — Other Ambulatory Visit: Payer: Self-pay | Admitting: Cardiovascular Disease

## 2020-07-16 ENCOUNTER — Other Ambulatory Visit (HOSPITAL_COMMUNITY): Payer: Self-pay | Admitting: Cardiology

## 2020-07-19 ENCOUNTER — Other Ambulatory Visit (HOSPITAL_COMMUNITY): Payer: Self-pay | Admitting: Cardiology

## 2020-07-20 NOTE — Progress Notes (Signed)
Remote pacemaker transmission.   

## 2020-07-26 ENCOUNTER — Other Ambulatory Visit (HOSPITAL_COMMUNITY): Payer: Self-pay | Admitting: Cardiology

## 2020-07-29 ENCOUNTER — Ambulatory Visit (HOSPITAL_COMMUNITY)
Admission: RE | Admit: 2020-07-29 | Discharge: 2020-07-29 | Disposition: A | Discharge status: Home / Self Care | Payer: PPO | Source: Ambulatory Visit | Attending: Cardiology | Admitting: Cardiology

## 2020-07-29 ENCOUNTER — Encounter (HOSPITAL_COMMUNITY): Payer: Self-pay | Admitting: Cardiology

## 2020-07-29 ENCOUNTER — Other Ambulatory Visit: Payer: Self-pay

## 2020-07-29 VITALS — BP 112/70 | HR 60 | Wt 120.2 lb

## 2020-07-29 DIAGNOSIS — I5022 Chronic systolic (congestive) heart failure: Secondary | ICD-10-CM

## 2020-07-29 DIAGNOSIS — I5042 Chronic combined systolic (congestive) and diastolic (congestive) heart failure: Secondary | ICD-10-CM | POA: Insufficient documentation

## 2020-07-29 DIAGNOSIS — I2510 Atherosclerotic heart disease of native coronary artery without angina pectoris: Secondary | ICD-10-CM | POA: Diagnosis not present

## 2020-07-29 DIAGNOSIS — Z7984 Long term (current) use of oral hypoglycemic drugs: Secondary | ICD-10-CM | POA: Insufficient documentation

## 2020-07-29 DIAGNOSIS — Z79899 Other long term (current) drug therapy: Secondary | ICD-10-CM | POA: Insufficient documentation

## 2020-07-29 DIAGNOSIS — I4819 Other persistent atrial fibrillation: Secondary | ICD-10-CM | POA: Diagnosis not present

## 2020-07-29 DIAGNOSIS — I428 Other cardiomyopathies: Secondary | ICD-10-CM | POA: Diagnosis not present

## 2020-07-29 DIAGNOSIS — R0602 Shortness of breath: Secondary | ICD-10-CM | POA: Insufficient documentation

## 2020-07-29 DIAGNOSIS — F17210 Nicotine dependence, cigarettes, uncomplicated: Secondary | ICD-10-CM | POA: Insufficient documentation

## 2020-07-29 DIAGNOSIS — I480 Paroxysmal atrial fibrillation: Secondary | ICD-10-CM | POA: Diagnosis not present

## 2020-07-29 DIAGNOSIS — E1122 Type 2 diabetes mellitus with diabetic chronic kidney disease: Secondary | ICD-10-CM | POA: Diagnosis not present

## 2020-07-29 DIAGNOSIS — Z8249 Family history of ischemic heart disease and other diseases of the circulatory system: Secondary | ICD-10-CM | POA: Insufficient documentation

## 2020-07-29 DIAGNOSIS — R0789 Other chest pain: Secondary | ICD-10-CM | POA: Diagnosis not present

## 2020-07-29 DIAGNOSIS — N189 Chronic kidney disease, unspecified: Secondary | ICD-10-CM | POA: Diagnosis not present

## 2020-07-29 DIAGNOSIS — Z7901 Long term (current) use of anticoagulants: Secondary | ICD-10-CM | POA: Insufficient documentation

## 2020-07-29 DIAGNOSIS — I4892 Unspecified atrial flutter: Secondary | ICD-10-CM | POA: Diagnosis not present

## 2020-07-29 LAB — CBC
HCT: 37.1 % (ref 36.0–46.0)
Hemoglobin: 12.3 g/dL (ref 12.0–15.0)
MCH: 34.9 pg — ABNORMAL HIGH (ref 26.0–34.0)
MCHC: 33.2 g/dL (ref 30.0–36.0)
MCV: 105.4 fL — ABNORMAL HIGH (ref 80.0–100.0)
Platelets: 192 10*3/uL (ref 150–400)
RBC: 3.52 MIL/uL — ABNORMAL LOW (ref 3.87–5.11)
RDW: 14.9 % (ref 11.5–15.5)
WBC: 10 10*3/uL (ref 4.0–10.5)
nRBC: 0 % (ref 0.0–0.2)

## 2020-07-29 LAB — BASIC METABOLIC PANEL
Anion gap: 13 (ref 5–15)
BUN: 30 mg/dL — ABNORMAL HIGH (ref 8–23)
CO2: 23 mmol/L (ref 22–32)
Calcium: 9.5 mg/dL (ref 8.9–10.3)
Chloride: 101 mmol/L (ref 98–111)
Creatinine, Ser: 1.45 mg/dL — ABNORMAL HIGH (ref 0.44–1.00)
GFR, Estimated: 35 mL/min — ABNORMAL LOW (ref >60)
Glucose, Bld: 145 mg/dL — ABNORMAL HIGH (ref 70–99)
Potassium: 3.9 mmol/L (ref 3.5–5.1)
Sodium: 137 mmol/L (ref 135–145)

## 2020-07-29 MED ORDER — EMPAGLIFLOZIN 10 MG PO TABS: 10.0000 mg | ORAL_TABLET | Freq: Every day | ORAL | 11 refills | Status: DC

## 2020-07-29 MED ORDER — FUROSEMIDE 20 MG PO TABS: 20.0000 mg | ORAL_TABLET | Freq: Every day | ORAL | 3 refills | Status: DC

## 2020-07-29 NOTE — Patient Instructions (Addendum)
Labs done today. We will contact you only if your labs are abnormal.  START Jardiance 10mg (1 tablet) by mouth daily  DECREASE Lasix to 20mg (1 tablet) by mouth daily.  No other medication changes were made. Please continue all current medications as prescribed.  Your physician recommends that you schedule a follow-up appointment in: 10 days for a lab only appointment and in 3 months for an appointment with Dr.McLean with an echo prior.  Your physician has requested that you have an echocardiogram. Echocardiography is a painless test that uses sound waves to create images of your heart. It provides your doctor with information about the size and shape of your heart and how well your heart's chambers and valves are working. This procedure takes approximately one hour. There are no restrictions for this procedure.   If you have any questions or concerns before your next appointment please send [place] a message through [place] or call our office at [phone].    TO LEAVE A MESSAGE FOR THE NURSE SELECT OPTION 2, PLEASE LEAVE A MESSAGE INCLUDING: . YOUR NAME . DATE OF BIRTH . CALL BACK NUMBER . REASON FOR CALL**this is important as we prioritize the call backs  YOU WILL RECEIVE A CALL BACK THE SAME DAY AS LONG AS YOU CALL BEFORE 4:00 PM   Do the following things EVERYDAY: 1) Weigh yourself in the morning before breakfast. Write it down and keep it in a log. 2) Take your medicines as prescribed 3) Eat low salt foods-Limit salt (sodium) to 2000 mg per day.  4) Stay as active as you can everyday 5) Limit all fluids for the day to less than 2 liters   At the [facility], you and your health needs are our priority. As part of our continuing mission to provide you with exceptional heart care, we have created designated Provider Care Teams. These Care Teams include your primary Cardiologist (physician) and Advanced Practice Providers (APPs- Physician Assistants and Nurse  Practitioners) who all work together to provide you with the care you need, when you need it.   You may see any of the following providers on your designated Care Team at your next follow up: [name] Dr Glori Bickers . Dr [name] . Darrick Grinder, NP . Lyda Jester, PA . Audry Riles, PharmD   Please be sure to bring in all your medications bottles to every appointment.  '

## 2020-07-31 NOTE — Progress Notes (Signed)
ID:  Shannon [name], DOB 20-Mar-1932, MRN [id number]  Provider location: [place] [facility] Failure Type of Visit: Established patient   PCP:  Tisovec, Fransico Him, MD  Cardiologist:  Mertie Moores, MD Primary HF: Dr. [name]  Chief Complaint: Shortness of breath   History of Present Illness: Shannon [name] is a 87 y.o. female who has a history of paroxysmal atrial fibrillation, chronic systolic CHF, and CAD. She apparently had been in persistent atrial fibrillation since around 8/16.  It appears that she was tachycardic most of the time prior to her recent admission in 7/17.  She had been on amiodarone prior to admission but it was not keeping her in NSR.  She was admitted with acute systolic CHF.  EF had dropped to 20-25% in 7/17 from 50-55% in 10/16.  She was in atrial fibrillation with RVR.  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  Amiodarone was stopped due to suspicion for amiodarone-induced lung toxicity (she was seen by pulmonary).  She was eventually cardioverted to NSR.  Ranolazine was started to try to keep her in NSR.  HR was low in the hospital so she has not been on a beta blocker.  She was extensively diuresed and discharged to SNF.  After discharge, she developed persistent nausea.  Eventually, it was found that the nausea was likely from ranolazine, so this medication was stopped and the nausea resolved.    Patient continued to have difficult-to-control atrial fibrillation.  GFR was too low for Tikosyn.  She therefore had AV nodal ablation with St Jude CRT-P device placed in 3/18.  She developed a pocket hematoma and had pocket revision with sub-pectoral placement in 5/18.   Echo in 9/19 showed EF up to 60-65%, moderate MR, moderate TR, PASP 43 mmHg. Echo in 10/20 showed EF 55-60%, moderately decreased RV systolic function with moderate RV enlargement, moderate-severe biatrial enlargement, moderate-severe TR, mild MR.    Patient returns for followup of CHF. She has been doing well generally.  Short of breath after walking about 1 block.  She is very active around the house: sweeps, mops, vacuums, and cooks. Rare atypical chest pain, generally occurs when she is lying down in bed (not exertional).    St Jude device interrogation: thoracic impedance monitoring suggests recent volume accumulation but improved over the last few days.  >99% BiV pacing.   Labs (7/17): Digoxin 1.6 => 0.3, K 4.6, creatinine 0.87 => 0.76, HCT 34, BNP 265 Labs (9/17): K 5, creatinine 1.36 Labs (11/17): K 4.9, creatinine 1.29, hgb 9.2, digoxin 0.5 Labs (12/17): hgb 10.4, digoxin 0.4 Labs (1/18): K 4, creatinine 1.62 => 1.49, hgb 9.5, LDL 42, HDL 63 Labs (3/18): K 4.1, creatinine 1.44, digoxin 0.4 Labs (6/18): K 3.9, creatinine 1.7, hgb 11.5, LDL 56, HDL 50 Labs (6/19): K 4.4, creatinine 1.7, hgb 12.8, LDL 54 Labs (10/19): K 4.2, creatinine 1.46 Labs (6/21): K 4.6, creatinine 1.5, LDL 43, HDL 51  PMH: 1. Atrial fibrillation: Paroxysmal.  Had been persistent since 8/16, was cardioverted to NSR during 7/17 admission.  Possible atypical atrial flutter on 12/17 ECG.  By 3/18, atrial fibrillation again.  - Nausea with ranolazine used for atrial fibrillation suppression.  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  - AV nodal ablation 3/18 with CRT-P.  2. 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  She had been in atrial fibrillation with elevated rate possibly for months prior to 7/17 admission.  - Echo (10/16): EF  50-55%.  - Echo (7/17): EF 20-25%, mildly decreased RV systolic function, severe TR, PA systolic pressure 64 mmHg.  - LHC/RHC (7/17): Patent OM stent, no obstructive CAD.  Mean RA 13, PA 45/25, mean PCWP 16, CI 2.36.  - Echo (1/18): EF 30-35% with diffuse hypokinesis, mildly decreased RV systolic function, moderate TR and MR, PASP 53 mmHg, severe LAE.  - St Jude CRT-P placed with AV  nodal ablation in 3/18.  - Echo (9/19): EF 60-65%, moderate LVH, grade 3 diastolic dysfunction, mild MR, mild AI, moderate TR, PASP 43 mmHg.  - Echo (10/20): EF 55-60%, moderately decreased RV systolic function with moderate RV enlargement, moderate-severe biatrial enlargement, moderate-severe TR, mild MR.  3. CAD: DES to OM in 2007.  LHC (7/17) with patent stent and no new obstructive disease.  4. Type II diabetes 5. Hyperlipidemia 6. TIA 7. Possible amiodarone lung toxicity: Chest CT (7/17) with bronchiectasis/fibrosis, patchy bilateral densities.    Past Surgical History:  Procedure Laterality Date  . ATRIAL FLUTTER ABLATION N/A 10/09/2012   Procedure: ATRIAL FLUTTER ABLATION;  Surgeon: Thompson Grayer, MD;  Location: [facility] CATH LAB;  Service: Cardiovascular;  Laterality: N/A;  . AV NODE ABLATION N/A 09/27/2016   Procedure: AV Node Ablation;  Surgeon: Thompson Grayer, MD;  Location: [place] CV LAB;  Service: Cardiovascular;  Laterality: N/A;  . BIV PACEMAKER INSERTION CRT-P N/A 09/27/2016   Procedure: BiV Pacemaker Insertion CRT-P;  Surgeon: Thompson Grayer, MD;  Location: [place] CV LAB;  Service: Cardiovascular;  Laterality: N/A;  . CARDIAC CATHETERIZATION N/A 01/06/2016   Procedure: Right/Left Heart Cath and Coronary Angiography;  Surgeon: Belva Crome, MD;  Location: [place] CV LAB;  Service: Cardiovascular;  Laterality: N/A;  . CARDIOVERSION N/A 01/11/2016   Procedure: CARDIOVERSION;  Surgeon: Larey Dresser, MD;  Location: [facility] ENDOSCOPY;  Service: Cardiovascular;  Laterality: N/A;  . cataracts    . COLONOSCOPY N/A 10/13/2015   Procedure: COLONOSCOPY;  Surgeon: Manus Gunning, MD;  Location: [facility] ENDOSCOPY;  Service: Gastroenterology;  Laterality: N/A;  . CORONARY ANGIOPLASTY     Status post PTCA and stenting of the first obtuse marginal-05/13/2006. We placed a 3.0 x 20 mm Taxus stent. It was post dilated using a 3.25 mm noncompliant balloon up to 14 atmospheres  . IMPLANT POCKET  INCISION & DRAINAGE N/A 11/02/2016   Procedure: Implant Pocket Incision & Drainage;  Surgeon: Evans Lance, MD;  Location: [place] CV LAB;  Service: Cardiovascular;  Laterality: N/A;  . TONSILLECTOMY       Current Outpatient Medications  Medication Sig Dispense Refill  . ACCU-CHEK FASTCLIX LANCETS MISC USE TO CHECK BLOOD SUGAR TWICE DAILY  5  . ACCU-CHEK SMARTVIEW test strip     . apixaban (ELIQUIS) 2.5 MG TABS tablet Take 1 tablet (2.5 mg total) by mouth 2 (two) times daily. 60 tablet 0  . atorvastatin (LIPITOR) 20 MG tablet TAKE 1 TABLET BY MOUTH EVERY DAY 90 tablet 2  . bisoprolol (ZEBETA) 5 MG tablet TAKE 1.5 TABLETS (7.5 MG TOTAL) BY MOUTH DAILY. NEEDS APPT FOR FURTHER REFILLS 135 tablet 1  . Cholecalciferol (VITAMIN D) 2000 units tablet Take 2,000 Units by mouth daily.    . empagliflozin (JARDIANCE) 10 MG TABS tablet Take 1 tablet (10 mg total) by mouth daily before breakfast. 30 tablet 11  . metFORMIN (GLUCOPHAGE) 500 MG tablet Take 500 mg by mouth daily with supper.     . nitroGLYCERIN (NITROSTAT) 0.4 MG SL tablet TAKE 1 TABLET BY MOUTH  UNDER THE TONGUE EVERY 5 MINUTES FOR 3 DOSES AS NEEDED FOR CHEST PAIN 75 tablet 3  . spironolactone (ALDACTONE) 25 MG tablet Take 1 tablet (25 mg total) by mouth daily. 30 tablet 0  . vitamin C (ASCORBIC ACID) 500 MG tablet Take 500 mg by mouth daily.    . furosemide (LASIX) 20 MG tablet Take 1 tablet (20 mg total) by mouth daily. 90 tablet 3   No current facility-administered medications for this encounter.    Allergies:   Amiodarone, Ranexa [ranolazine], Other, Demerol, Erythromycin, Penicillins, and Percocet [oxycodone-acetaminophen]   Social History:  The patient  reports that she quit smoking about 57 years ago. Her smoking use included cigarettes. She smoked 1.00 pack per day. She has never used smokeless tobacco. She reports that she does not drink alcohol and does not use drugs.   Family History:  The patient's family history includes  Alzheimer's disease in her sister; Coronary artery disease in her sister; Heart attack in her brother, father, mother, and sister; Stroke in her mother and sister.   ROS:  Please see the history of present illness.   All other systems are personally reviewed and negative.   Exam:  BP 112/70   Pulse 60   Wt 54.5 kg (120 lb 3.2 oz)   SpO2 98%   BMI 20.63 kg/m  General: NAD Neck: No JVD, no thyromegaly or thyroid nodule.  Lungs: Clear to auscultation bilaterally with normal respiratory effort. CV: Nondisplaced PMI.  Heart regular S1/S2, no S3/S4, no murmur.  No peripheral edema.  No carotid bruit.  Normal pedal pulses.  Abdomen: Soft, nontender, no hepatosplenomegaly, no distention.  Skin: Intact without lesions or rashes.  Neurologic: Alert and oriented x 3.  Psych: Normal affect. Extremities: No clubbing or cyanosis.  HEENT: Normal.   Recent Labs: 07/29/2020: BUN 30; Creatinine, Ser 1.45; Hemoglobin 12.3; Platelets 192; Potassium 3.9; Sodium 137  Personally reviewed   Wt Readings from Last 3 Encounters:  07/29/20 54.5 kg (120 lb 3.2 oz)  03/25/20 54.9 kg (121 lb)  10/22/19 53.5 kg (118 lb)     ASSESSMENT AND PLAN:  1. Chronic systolic=>diastolic CHF: EF 28-76% on 7/17 echo, 30-35% on 1/18 echo.  Nonischemic cardiomyopathy, possible role for tachycardia-mediated cardiomyopathy (some improvement in EF with rate control but not back to normal). ECHO 03/2017 EF 40-45%.  She had AV nodal ablation due to difficult-to-control atrial fibrillation in 3/18 with St Jude CRT-P placement. Repeat echo in 9/19 showed EF up to 60-65%.  Echo in 10/20 with EF 55-60%, moderately decreased RV systolic function with moderate RV enlargement, moderate-severe biatrial enlargement, moderate-severe TR, mild MR.  NYHA class II.  This echo suggests significant RV failure.  By exam and Corvue, not volume overloaded (though Corvue suggests recent volume overload).   - I will have her decrease Lasix to 20 mg daily  and start Jardiance 10 mg daily.  BMET today and in 10 days.  - Unable to tolerate losartan at even low dose.  - Continue bisoprolol 7.5 mg daily.  - Continue spironolactone 25 mg daily.  - Repeat echo at followup to reassess LV and RV function.  2. Atrial fibrillation/flutter:  Concern for tachy-mediated cardiomyopathy.  She had suspected amiodarone-induced lung toxicity so ranolazine was started.  She did not tolerate ranolazine due to nausea. Poor Tikosyn candidate with CKD.   She tolerated atrial fibrillation poorly and rate was difficult to control, so she had AV nodal ablation with St Jude CRT-P device.  She  seems to be doing better since the ablation.  - Continue apixaban 2.5 mg bid. CBC today.  3. CAD: Patent OM stent on coronary angiography in 7/17.  No chest pain.  - Continue statin, good lipids in 6/21.  - No ASA with apixaban use.    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  Off amiodarone.  Follows with pulmonary.   Recommended follow-up:  3 months with echo.    Signed, [name], MD  07/31/2020  [facility] [address] [facility] [place] [address] [phone] (office) [phone] (fax)

## 2020-08-08 ENCOUNTER — Ambulatory Visit: Payer: PPO | Admitting: Podiatry

## 2020-08-08 ENCOUNTER — Other Ambulatory Visit (HOSPITAL_COMMUNITY): Payer: PPO

## 2020-08-11 ENCOUNTER — Other Ambulatory Visit: Payer: Self-pay

## 2020-08-11 ENCOUNTER — Ambulatory Visit (HOSPITAL_COMMUNITY)
Admission: RE | Admit: 2020-08-11 | Discharge: 2020-08-11 | Disposition: A | Discharge status: Home / Self Care | Payer: PPO | Source: Ambulatory Visit | Attending: Internal Medicine | Admitting: Internal Medicine

## 2020-08-11 DIAGNOSIS — I5022 Chronic systolic (congestive) heart failure: Secondary | ICD-10-CM | POA: Diagnosis not present

## 2020-08-11 LAB — BASIC METABOLIC PANEL
Anion gap: 14 (ref 5–15)
BUN: 29 mg/dL — ABNORMAL HIGH (ref 8–23)
CO2: 24 mmol/L (ref 22–32)
Calcium: 9.5 mg/dL (ref 8.9–10.3)
Chloride: 100 mmol/L (ref 98–111)
Creatinine, Ser: 1.69 mg/dL — ABNORMAL HIGH (ref 0.44–1.00)
GFR, Estimated: 29 mL/min — ABNORMAL LOW (ref >60)
Glucose, Bld: 254 mg/dL — ABNORMAL HIGH (ref 70–99)
Potassium: 4.4 mmol/L (ref 3.5–5.1)
Sodium: 138 mmol/L (ref 135–145)

## 2020-08-19 ENCOUNTER — Other Ambulatory Visit (HOSPITAL_COMMUNITY): Payer: Self-pay | Admitting: Cardiology

## 2020-09-07 ENCOUNTER — Telehealth (HOSPITAL_COMMUNITY): Payer: Self-pay | Admitting: *Deleted

## 2020-09-07 NOTE — Telephone Encounter (Signed)
Jardiance would be unlikely to cause cough.  Would have her get a BMET to make sure that her creatinine is stable.  Would be reasonable to get her an appointment in APP clinic as well to evaluate.

## 2020-09-07 NOTE — Telephone Encounter (Signed)
Pt called stating since starting jardiance she has a bad cough, headache, weakness, change in demeanor, weight loss, and spells of confusion. Pt said her cough is very hard its like she has bronchitis.   Routed to [place]

## 2020-09-08 NOTE — Telephone Encounter (Signed)
Left vm requesting return call.  

## 2020-09-09 DIAGNOSIS — I517 Cardiomegaly: Secondary | ICD-10-CM | POA: Diagnosis not present

## 2020-09-09 DIAGNOSIS — R059 Cough, unspecified: Secondary | ICD-10-CM | POA: Diagnosis not present

## 2020-09-09 DIAGNOSIS — R531 Weakness: Secondary | ICD-10-CM | POA: Diagnosis not present

## 2020-09-10 ENCOUNTER — Telehealth: Payer: Self-pay | Admitting: Physician Assistant

## 2020-09-10 NOTE — Telephone Encounter (Signed)
Pt called stating she had runny nose and productive cough of clear phlegm x 5 days. She went to urgent care yesterday and was told her BNP was 635 and advised to call her PCP and cardiologist. She denies dyspnea, LE edema, weight gain. She does not report signs of symptoms of volume overload. I advised that it sounded like a viral cold, she was surprised that this was going around. I advised to stay on the jardiance and to be seen next week for her BNP. We reviewed ER precautions and I advised to call her PCP if her cold did not get better. She expressed understanding of the plan.    Ledora Bottcher, PA-C 09/10/2020, 2:33 PM [facility] [address] [place], [place] [address]

## 2020-09-12 NOTE — Telephone Encounter (Signed)
Left vm for pt to return my call to arrange labs. Pt can have labs drawn at pcp and faxed to [place] or can have lab appt here.

## 2020-09-12 NOTE — Telephone Encounter (Signed)
Please set up for BMET/BNP.   Thanks Amika Tassin

## 2020-09-18 ENCOUNTER — Other Ambulatory Visit (HOSPITAL_COMMUNITY): Payer: Self-pay | Admitting: Cardiology

## 2020-09-19 ENCOUNTER — Other Ambulatory Visit (HOSPITAL_COMMUNITY): Payer: Self-pay | Admitting: Cardiology

## 2020-10-04 ENCOUNTER — Ambulatory Visit (INDEPENDENT_AMBULATORY_CARE_PROVIDER_SITE_OTHER): Payer: PPO

## 2020-10-04 DIAGNOSIS — I420 Dilated cardiomyopathy: Secondary | ICD-10-CM | POA: Diagnosis not present

## 2020-10-05 LAB — CUP PACEART REMOTE DEVICE CHECK
Battery Remaining Longevity: 27 mo
Battery Remaining Percentage: 50 %
Battery Voltage: 2.87 V
Date Time Interrogation Session: 20220405020015
Implantable Lead Implant Date: 20180329
Implantable Lead Implant Date: 20180329
Implantable Lead Implant Date: 20180329
Implantable Lead Location: 753859
Implantable Lead Location: 753860
Implantable Lead Location: 753860
Implantable Lead Model: 3830
Implantable Lead Model: 5076
Implantable Pulse Generator Implant Date: 20180329
Lead Channel Impedance Value: 330 Ohm
Lead Channel Impedance Value: 400 Ohm
Lead Channel Pacing Threshold Amplitude: 0.75 V
Lead Channel Pacing Threshold Amplitude: 2.25 V
Lead Channel Pacing Threshold Pulse Width: 0.5 ms
Lead Channel Pacing Threshold Pulse Width: 1 ms
Lead Channel Sensing Intrinsic Amplitude: 4.3 mV
Lead Channel Setting Pacing Amplitude: 2 V
Lead Channel Setting Pacing Amplitude: 3 V
Lead Channel Setting Pacing Pulse Width: 0.5 ms
Lead Channel Setting Pacing Pulse Width: 1 ms
Lead Channel Setting Sensing Sensitivity: 4 mV
Pulse Gen Model: 3222
Pulse Gen Serial Number: 8006933

## 2020-10-17 NOTE — Progress Notes (Signed)
Remote pacemaker transmission.   

## 2020-10-28 ENCOUNTER — Ambulatory Visit (HOSPITAL_BASED_OUTPATIENT_CLINIC_OR_DEPARTMENT_OTHER)
Admission: RE | Admit: 2020-10-28 | Discharge: 2020-10-28 | Disposition: A | Discharge status: Home / Self Care | Payer: PPO | Source: Ambulatory Visit | Attending: Cardiology | Admitting: Cardiology

## 2020-10-28 ENCOUNTER — Ambulatory Visit (HOSPITAL_COMMUNITY)
Admission: RE | Admit: 2020-10-28 | Discharge: 2020-10-28 | Disposition: A | Discharge status: Home / Self Care | Payer: PPO | Source: Ambulatory Visit | Attending: Cardiology | Admitting: Cardiology

## 2020-10-28 ENCOUNTER — Encounter (HOSPITAL_COMMUNITY): Payer: Self-pay | Admitting: Cardiology

## 2020-10-28 ENCOUNTER — Other Ambulatory Visit: Payer: Self-pay

## 2020-10-28 VITALS — BP 92/60 | HR 60 | Wt 118.2 lb

## 2020-10-28 DIAGNOSIS — Z880 Allergy status to penicillin: Secondary | ICD-10-CM | POA: Diagnosis not present

## 2020-10-28 DIAGNOSIS — Z881 Allergy status to other antibiotic agents status: Secondary | ICD-10-CM | POA: Insufficient documentation

## 2020-10-28 DIAGNOSIS — Z87891 Personal history of nicotine dependence: Secondary | ICD-10-CM | POA: Insufficient documentation

## 2020-10-28 DIAGNOSIS — Z79899 Other long term (current) drug therapy: Secondary | ICD-10-CM | POA: Insufficient documentation

## 2020-10-28 DIAGNOSIS — Z885 Allergy status to narcotic agent status: Secondary | ICD-10-CM | POA: Diagnosis not present

## 2020-10-28 DIAGNOSIS — I2510 Atherosclerotic heart disease of native coronary artery without angina pectoris: Secondary | ICD-10-CM | POA: Diagnosis not present

## 2020-10-28 DIAGNOSIS — I5022 Chronic systolic (congestive) heart failure: Secondary | ICD-10-CM

## 2020-10-28 DIAGNOSIS — Z8249 Family history of ischemic heart disease and other diseases of the circulatory system: Secondary | ICD-10-CM | POA: Insufficient documentation

## 2020-10-28 DIAGNOSIS — I4892 Unspecified atrial flutter: Secondary | ICD-10-CM | POA: Insufficient documentation

## 2020-10-28 DIAGNOSIS — N189 Chronic kidney disease, unspecified: Secondary | ICD-10-CM | POA: Diagnosis not present

## 2020-10-28 DIAGNOSIS — Z7901 Long term (current) use of anticoagulants: Secondary | ICD-10-CM | POA: Diagnosis not present

## 2020-10-28 DIAGNOSIS — Z950 Presence of cardiac pacemaker: Secondary | ICD-10-CM | POA: Insufficient documentation

## 2020-10-28 DIAGNOSIS — E1122 Type 2 diabetes mellitus with diabetic chronic kidney disease: Secondary | ICD-10-CM | POA: Diagnosis not present

## 2020-10-28 DIAGNOSIS — Z7984 Long term (current) use of oral hypoglycemic drugs: Secondary | ICD-10-CM | POA: Diagnosis not present

## 2020-10-28 DIAGNOSIS — I428 Other cardiomyopathies: Secondary | ICD-10-CM | POA: Diagnosis not present

## 2020-10-28 DIAGNOSIS — I480 Paroxysmal atrial fibrillation: Secondary | ICD-10-CM | POA: Insufficient documentation

## 2020-10-28 DIAGNOSIS — Z955 Presence of coronary angioplasty implant and graft: Secondary | ICD-10-CM | POA: Insufficient documentation

## 2020-10-28 LAB — CBC
HCT: 38.5 % (ref 36.0–46.0)
Hemoglobin: 12.8 g/dL (ref 12.0–15.0)
MCH: 35.2 pg — ABNORMAL HIGH (ref 26.0–34.0)
MCHC: 33.2 g/dL (ref 30.0–36.0)
MCV: 105.8 fL — ABNORMAL HIGH (ref 80.0–100.0)
Platelets: 188 10*3/uL (ref 150–400)
RBC: 3.64 MIL/uL — ABNORMAL LOW (ref 3.87–5.11)
RDW: 15.6 % — ABNORMAL HIGH (ref 11.5–15.5)
WBC: 7.9 10*3/uL (ref 4.0–10.5)
nRBC: 0 % (ref 0.0–0.2)

## 2020-10-28 LAB — ECHOCARDIOGRAM COMPLETE
AR max vel: 1.26 cm2
AV Area VTI: 1.47 cm2
AV Area mean vel: 1.35 cm2
AV Mean grad: 2 mmHg
AV Peak grad: 4.8 mmHg
Ao pk vel: 1.09 m/s
Area-P 1/2: 3.27 cm2
Calc EF: 51.5 %
MV M vel: 3.96 m/s
MV Peak grad: 62.7 mmHg
MV VTI: 1.38 cm2
Radius: 0.5 cm
S' Lateral: 3.1 cm
Single Plane A2C EF: 48.5 %
Single Plane A4C EF: 53.1 %

## 2020-10-28 LAB — BASIC METABOLIC PANEL
Anion gap: 9 (ref 5–15)
BUN: 27 mg/dL — ABNORMAL HIGH (ref 8–23)
CO2: 26 mmol/L (ref 22–32)
Calcium: 9.3 mg/dL (ref 8.9–10.3)
Chloride: 100 mmol/L (ref 98–111)
Creatinine, Ser: 1.66 mg/dL — ABNORMAL HIGH (ref 0.44–1.00)
GFR, Estimated: 30 mL/min — ABNORMAL LOW (ref >60)
Glucose, Bld: 155 mg/dL — ABNORMAL HIGH (ref 70–99)
Potassium: 4.1 mmol/L (ref 3.5–5.1)
Sodium: 135 mmol/L (ref 135–145)

## 2020-10-28 MED ORDER — BISOPROLOL FUMARATE 5 MG PO TABS: 5.0000 mg | ORAL_TABLET | Freq: Every day | ORAL | 3 refills | Status: DC

## 2020-10-28 NOTE — Patient Instructions (Signed)
Decrease Bisoprolol to 5 mg Daily  Labs done today, your results will be available in MyChart, we will contact you for abnormal readings.  Please call our office in September to schedule your follow up appointment  If you have any questions or concerns before your next appointment please send [place] a message through [place] or call our office at [phone].    TO LEAVE A MESSAGE FOR THE NURSE SELECT OPTION 2, PLEASE LEAVE A MESSAGE INCLUDING: . YOUR NAME . DATE OF BIRTH . CALL BACK NUMBER . REASON FOR CALL**this is important as we prioritize the call backs  [place] AS LONG AS YOU CALL BEFORE 4:00 PM  At the [facility], you and your health needs are our priority. As part of our continuing mission to provide you with exceptional heart care, we have created designated Provider Care Teams. These Care Teams include your primary Cardiologist (physician) and Advanced Practice Providers (APPs- Physician Assistants and Nurse Practitioners) who all work together to provide you with the care you need, when you need it.   You may see any of the following providers on your designated Care Team at your next follow up: [name] Dr Glori Bickers . Dr [name] . Dr Vickki Muff . Darrick Grinder, NP . Lyda Jester, [place] . Audry Riles, PharmD   Please be sure to bring in all your medications bottles to every appointment.

## 2020-10-28 NOTE — Progress Notes (Signed)
  Echocardiogram 2D Echocardiogram has been performed.  Shannon [name] 10/28/2020, 1:57 PM

## 2020-10-30 NOTE — Progress Notes (Signed)
ID:  Shannon [name], DOB 06-Oct-1931, MRN [id number]  Provider location:  Advanced Heart Failure Type of Visit: Established patient   PCP:  Tisovec, Fransico Him, MD  Cardiologist:  Mertie Moores, MD Primary HF: Dr. [name]  Chief Complaint: Shortness of breath   History of Present Illness: Shannon [name] is a 87 y.o. female who has a history of paroxysmal atrial fibrillation, chronic systolic CHF, and CAD. She apparently had been in persistent atrial fibrillation since around 8/16.  It appears that she was tachycardic most of the time prior to her recent admission in 7/17.  She had been on amiodarone prior to admission but it was not keeping her in NSR.  She was admitted with acute systolic CHF.  EF had dropped to 20-25% in 7/17 from 50-55% in 10/16.  She was in atrial fibrillation with RVR.  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  Amiodarone was stopped due to suspicion for amiodarone-induced lung toxicity (she was seen by pulmonary).  She was eventually cardioverted to NSR.  Ranolazine was started to try to keep her in NSR.  HR was low in the hospital so she has not been on a beta blocker.  She was extensively diuresed and discharged to SNF.  After discharge, she developed persistent nausea.  Eventually, it was found that the nausea was likely from ranolazine, so this medication was stopped and the nausea resolved.    Patient continued to have difficult-to-control atrial fibrillation.  GFR was too low for Tikosyn.  She therefore had AV nodal ablation with St Jude CRT-P device placed in 3/18.  She developed a pocket hematoma and had pocket revision with sub-pectoral placement in 5/18.   Echo in 9/19 showed EF up to 60-65%, moderate MR, moderate TR, PASP 43 mmHg. Echo in 10/20 showed EF 55-60%, moderately decreased RV systolic function with moderate RV enlargement, moderate-severe biatrial enlargement, moderate-severe TR, mild MR.    Echo was done today and reviewed, EF 55% with mild LVH, mild RV enlargement with mildly decreased RV systolic function, severe biatrial enlargement, mild MR, moderate TR.   Patient returns for followup of CHF.  No dyspnea walking on flat ground and she can climb a flight of stairs. No chest pain.  No orthopnea/PND.  Weight down 2 lbs.  Soft BP but with occasional orthostatic symptoms.     St Jude device interrogation:  Stable thoracic impedance,  >99% BiV pacing.   Labs (7/17): Digoxin 1.6 => 0.3, K 4.6, creatinine 0.87 => 0.76, HCT 34, BNP 265 Labs (9/17): K 5, creatinine 1.36 Labs (11/17): K 4.9, creatinine 1.29, hgb 9.2, digoxin 0.5 Labs (12/17): hgb 10.4, digoxin 0.4 Labs (1/18): K 4, creatinine 1.62 => 1.49, hgb 9.5, LDL 42, HDL 63 Labs (3/18): K 4.1, creatinine 1.44, digoxin 0.4 Labs (6/18): K 3.9, creatinine 1.7, hgb 11.5, LDL 56, HDL 50 Labs (6/19): K 4.4, creatinine 1.7, hgb 12.8, LDL 54 Labs (10/19): K 4.2, creatinine 1.46 Labs (6/21): K 4.6, creatinine 1.5, LDL 43, HDL 51 Labs (3/22): BNP 635, LFTs normal, K 3.8, creatinine 1.81  PMH: 1. Atrial fibrillation: Paroxysmal.  Had been persistent since 8/16, was cardioverted to NSR during 7/17 admission.  Possible atypical atrial flutter on 12/17 ECG.  By 3/18, atrial fibrillation again.  - Nausea with ranolazine used for atrial fibrillation suppression.  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  - AV nodal ablation 3/18 with CRT-P.  2. 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  She had been in atrial fibrillation with elevated rate possibly for months prior to 7/17 admission.  - Echo (10/16): EF 50-55%.  - Echo (7/17): EF 20-25%, mildly decreased RV systolic function, severe TR, PA systolic pressure 64 mmHg.  - LHC/RHC (7/17): Patent OM stent, no obstructive CAD.  Mean RA 13, PA 45/25, mean PCWP 16, CI 2.36.  - Echo (1/18): EF 30-35% with diffuse hypokinesis, mildly decreased RV  systolic function, moderate TR and MR, PASP 53 mmHg, severe LAE.  - St Jude CRT-P placed with AV nodal ablation in 3/18.  - Echo (9/19): EF 60-65%, moderate LVH, grade 3 diastolic dysfunction, mild MR, mild AI, moderate TR, PASP 43 mmHg.  - Echo (10/20): EF 55-60%, moderately decreased RV systolic function with moderate RV enlargement, moderate-severe biatrial enlargement, moderate-severe TR, mild MR.  - Echo (4/22): EF 55% with mild LVH, mild RV enlargement with mildly decreased RV systolic function, severe biatrial enlargement, mild MR, moderate TR.  3. CAD: DES to OM in 2007.  LHC (7/17) with patent stent and no new obstructive disease.  4. Type II diabetes 5. Hyperlipidemia 6. TIA 7. Possible amiodarone lung toxicity: Chest CT (7/17) with bronchiectasis/fibrosis, patchy bilateral densities.    Past Surgical History:  Procedure Laterality Date  . ATRIAL FLUTTER ABLATION N/A 10/09/2012   Procedure: ATRIAL FLUTTER ABLATION;  Surgeon: Thompson Grayer, MD;  Location: [facility] CATH LAB;  Service: Cardiovascular;  Laterality: N/A;  . AV NODE ABLATION N/A 09/27/2016   Procedure: AV Node Ablation;  Surgeon: Thompson Grayer, MD;  Location: [place] CV LAB;  Service: Cardiovascular;  Laterality: N/A;  . BIV PACEMAKER INSERTION CRT-P N/A 09/27/2016   Procedure: BiV Pacemaker Insertion CRT-P;  Surgeon: Thompson Grayer, MD;  Location: [place] CV LAB;  Service: Cardiovascular;  Laterality: N/A;  . CARDIAC CATHETERIZATION N/A 01/06/2016   Procedure: Right/Left Heart Cath and Coronary Angiography;  Surgeon: Belva Crome, MD;  Location: [place] CV LAB;  Service: Cardiovascular;  Laterality: N/A;  . CARDIOVERSION N/A 01/11/2016   Procedure: CARDIOVERSION;  Surgeon: Larey Dresser, MD;  Location: [facility] ENDOSCOPY;  Service: Cardiovascular;  Laterality: N/A;  . cataracts    . COLONOSCOPY N/A 10/13/2015   Procedure: COLONOSCOPY;  Surgeon: Manus Gunning, MD;  Location: [facility] ENDOSCOPY;  Service: Gastroenterology;   Laterality: N/A;  . CORONARY ANGIOPLASTY     Status post PTCA and stenting of the first obtuse marginal-05/13/2006. We placed a 3.0 x 20 mm Taxus stent. It was post dilated using a 3.25 mm noncompliant balloon up to 14 atmospheres  . IMPLANT POCKET INCISION & DRAINAGE N/A 11/02/2016   Procedure: Implant Pocket Incision & Drainage;  Surgeon: Evans Lance, MD;  Location: [place] CV LAB;  Service: Cardiovascular;  Laterality: N/A;  . TONSILLECTOMY       Current Outpatient Medications  Medication Sig Dispense Refill  . ACCU-CHEK FASTCLIX LANCETS MISC USE TO CHECK BLOOD SUGAR TWICE DAILY  5  . ACCU-CHEK SMARTVIEW test strip     . atorvastatin (LIPITOR) 20 MG tablet TAKE 1 TABLET BY MOUTH EVERY DAY 90 tablet 2  . bisoprolol (ZEBETA) 5 MG tablet Take 1 tablet (5 mg total) by mouth daily. 90 tablet 3  . Cholecalciferol (VITAMIN D) 2000 units tablet Take 2,000 Units by mouth daily.    [name] ELIQUIS 2.5 MG TABS tablet TAKE 1 TABLET BY MOUTH TWICE A DAY 60 tablet 11  . empagliflozin (JARDIANCE) 10 MG TABS tablet Take 1 tablet (10 mg total) by mouth daily  before breakfast. 30 tablet 11  . furosemide (LASIX) 20 MG tablet Take 1 tablet (20 mg total) by mouth daily. 90 tablet 3  . metFORMIN (GLUCOPHAGE) 500 MG tablet Take 500 mg by mouth daily with supper.     . nitroGLYCERIN (NITROSTAT) 0.4 MG SL tablet TAKE 1 TABLET BY MOUTH UNDER THE TONGUE EVERY 5 MINUTES FOR 3 DOSES AS NEEDED FOR CHEST PAIN 75 tablet 3  . spironolactone (ALDACTONE) 25 MG tablet TAKE 1 TABLET (25 MG TOTAL) BY MOUTH DAILY. 30 tablet 11  . vitamin C (ASCORBIC ACID) 500 MG tablet Take 500 mg by mouth daily.     No current facility-administered medications for this encounter.    Allergies:   Amiodarone, Ranexa [ranolazine], Other, Demerol, Erythromycin, Penicillins, and Percocet [oxycodone-acetaminophen]   Social History:  The patient  reports that she quit smoking about 57 years ago. Her smoking use included cigarettes. She smoked  1.00 pack per day. She has never used smokeless tobacco. She reports that she does not drink alcohol and does not use drugs.   Family History:  The patient's family history includes Alzheimer's disease in her sister; Coronary artery disease in her sister; Heart attack in her brother, father, mother, and sister; Stroke in her mother and sister.   ROS:  Please see the history of present illness.   All other systems are personally reviewed and negative.   Exam:  BP 92/60   Pulse 60   Wt 53.6 kg (118 lb 3.2 oz)   SpO2 98%   BMI 20.29 kg/m  General: NAD Neck: No JVD, no thyromegaly or thyroid nodule.  Lungs: Clear to auscultation bilaterally with normal respiratory effort. CV: Nondisplaced PMI.  Heart regular S1/S2, no S3/S4, no murmur.  No peripheral edema.  No carotid bruit.  Normal pedal pulses.  Abdomen: Soft, nontender, no hepatosplenomegaly, no distention.  Skin: Intact without lesions or rashes.  Neurologic: Alert and oriented x 3.  Psych: Normal affect. Extremities: No clubbing or cyanosis.  HEENT: Normal.   Recent Labs: 10/28/2020: BUN 27; Creatinine, Ser 1.66; Hemoglobin 12.8; Platelets 188; Potassium 4.1; Sodium 135  Personally reviewed   Wt Readings from Last 3 Encounters:  10/28/20 53.6 kg (118 lb 3.2 oz)  07/29/20 54.5 kg (120 lb 3.2 oz)  03/25/20 54.9 kg (121 lb)     ASSESSMENT AND PLAN:  1. Chronic systolic=>diastolic CHF: EF 83-38% on 7/17 echo, 30-35% on 1/18 echo.  Nonischemic cardiomyopathy, possible role for tachycardia-mediated cardiomyopathy (some improvement in EF with rate control but not back to normal). ECHO 03/2017 EF 40-45%.  She had AV nodal ablation due to difficult-to-control atrial fibrillation in 3/18 with St Jude CRT-P placement. Repeat echo in 9/19 showed EF up to 60-65%.  Echo in 10/20 with EF 55-60%, moderately decreased RV systolic function with moderate RV enlargement, moderate-severe biatrial enlargement, moderate-severe TR, mild MR.  Echo in 4/22  with EF 55% with mild LVH, mild RV enlargement with mildly decreased RV systolic function, severe biatrial enlargement, mild MR, moderate TR. NYHA class II.  She is not volume overloaded by exam or [place].    - Continue Lasix 20 mg daily.  BMET today.  - Unable to tolerate losartan at even low dose.  - With soft BP and orthostatic symptoms, decrease bisoprolol to 5 mg daily.   - Continue spironolactone 25 mg daily (take at bedtime).    2. Atrial fibrillation/flutter:  Concern for tachy-mediated cardiomyopathy.  She had suspected amiodarone-induced lung toxicity so ranolazine was started.  She did not tolerate ranolazine due to nausea. Poor Tikosyn candidate with CKD.   She tolerated atrial fibrillation poorly and rate was difficult to control, so she had AV nodal ablation with St Jude CRT-P device.  She seems to be doing better since the ablation.  - Continue apixaban 2.5 mg bid. CBC today.  3. CAD: Patent OM stent on coronary angiography in 7/17.  No chest pain.  - Continue statin, good lipids in 6/21.  - No ASA with apixaban use.    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  Off amiodarone.  Follows with pulmonary.   Recommended follow-up:  6 months.    Signed, [name], MD  10/30/2020  [place] [address] [facility] [place] [address] [phone] (office) [phone] (fax)

## 2020-11-16 ENCOUNTER — Ambulatory Visit: Payer: PPO | Admitting: Podiatry

## 2020-11-20 ENCOUNTER — Other Ambulatory Visit (HOSPITAL_COMMUNITY): Payer: Self-pay | Admitting: Cardiology

## 2020-12-27 ENCOUNTER — Other Ambulatory Visit (HOSPITAL_COMMUNITY): Payer: Self-pay | Admitting: Cardiology

## 2020-12-28 DIAGNOSIS — E559 Vitamin D deficiency, unspecified: Secondary | ICD-10-CM | POA: Diagnosis not present

## 2020-12-28 DIAGNOSIS — E7800 Pure hypercholesterolemia, unspecified: Secondary | ICD-10-CM | POA: Diagnosis not present

## 2020-12-28 DIAGNOSIS — E1151 Type 2 diabetes mellitus with diabetic peripheral angiopathy without gangrene: Secondary | ICD-10-CM | POA: Diagnosis not present

## 2021-01-03 ENCOUNTER — Ambulatory Visit (INDEPENDENT_AMBULATORY_CARE_PROVIDER_SITE_OTHER): Payer: PPO

## 2021-01-03 DIAGNOSIS — I5022 Chronic systolic (congestive) heart failure: Secondary | ICD-10-CM

## 2021-01-03 LAB — CUP PACEART REMOTE DEVICE CHECK
Battery Remaining Longevity: 6 mo
Battery Remaining Percentage: 12 %
Battery Voltage: 2.84 V
Date Time Interrogation Session: 20220705020014
Implantable Lead Implant Date: 20180329
Implantable Lead Implant Date: 20180329
Implantable Lead Implant Date: 20180329
Implantable Lead Location: 753859
Implantable Lead Location: 753860
Implantable Lead Location: 753860
Implantable Lead Model: 3830
Implantable Lead Model: 5076
Implantable Pulse Generator Implant Date: 20180329
Lead Channel Impedance Value: 400 Ohm
Lead Channel Impedance Value: 410 Ohm
Lead Channel Pacing Threshold Amplitude: 0.75 V
Lead Channel Pacing Threshold Amplitude: 2.25 V
Lead Channel Pacing Threshold Pulse Width: 0.5 ms
Lead Channel Pacing Threshold Pulse Width: 1 ms
Lead Channel Sensing Intrinsic Amplitude: 4.7 mV
Lead Channel Setting Pacing Amplitude: 2 V
Lead Channel Setting Pacing Amplitude: 3 V
Lead Channel Setting Pacing Pulse Width: 0.5 ms
Lead Channel Setting Pacing Pulse Width: 1 ms
Lead Channel Setting Sensing Sensitivity: 4 mV
Pulse Gen Model: 3222
Pulse Gen Serial Number: 8006933

## 2021-01-04 DIAGNOSIS — Z7901 Long term (current) use of anticoagulants: Secondary | ICD-10-CM | POA: Diagnosis not present

## 2021-01-04 DIAGNOSIS — I1310 Hypertensive heart and chronic kidney disease without heart failure, with stage 1 through stage 4 chronic kidney disease, or unspecified chronic kidney disease: Secondary | ICD-10-CM | POA: Diagnosis not present

## 2021-01-04 DIAGNOSIS — E1151 Type 2 diabetes mellitus with diabetic peripheral angiopathy without gangrene: Secondary | ICD-10-CM | POA: Diagnosis not present

## 2021-01-04 DIAGNOSIS — I509 Heart failure, unspecified: Secondary | ICD-10-CM | POA: Diagnosis not present

## 2021-01-04 DIAGNOSIS — Z1339 Encounter for screening examination for other mental health and behavioral disorders: Secondary | ICD-10-CM | POA: Diagnosis not present

## 2021-01-04 DIAGNOSIS — D692 Other nonthrombocytopenic purpura: Secondary | ICD-10-CM | POA: Diagnosis not present

## 2021-01-04 DIAGNOSIS — I2510 Atherosclerotic heart disease of native coronary artery without angina pectoris: Secondary | ICD-10-CM | POA: Diagnosis not present

## 2021-01-04 DIAGNOSIS — Z0000 Encounter for general adult medical examination without abnormal findings: Secondary | ICD-10-CM | POA: Diagnosis not present

## 2021-01-04 DIAGNOSIS — I482 Chronic atrial fibrillation, unspecified: Secondary | ICD-10-CM | POA: Diagnosis not present

## 2021-01-04 DIAGNOSIS — E1140 Type 2 diabetes mellitus with diabetic neuropathy, unspecified: Secondary | ICD-10-CM | POA: Diagnosis not present

## 2021-01-04 DIAGNOSIS — I739 Peripheral vascular disease, unspecified: Secondary | ICD-10-CM | POA: Diagnosis not present

## 2021-01-04 DIAGNOSIS — D6869 Other thrombophilia: Secondary | ICD-10-CM | POA: Diagnosis not present

## 2021-01-04 DIAGNOSIS — N184 Chronic kidney disease, stage 4 (severe): Secondary | ICD-10-CM | POA: Diagnosis not present

## 2021-01-04 DIAGNOSIS — Z1331 Encounter for screening for depression: Secondary | ICD-10-CM | POA: Diagnosis not present

## 2021-01-23 NOTE — Progress Notes (Signed)
Remote pacemaker transmission.   

## 2021-04-04 ENCOUNTER — Telehealth: Payer: Self-pay

## 2021-04-04 ENCOUNTER — Ambulatory Visit (INDEPENDENT_AMBULATORY_CARE_PROVIDER_SITE_OTHER): Payer: PPO

## 2021-04-04 DIAGNOSIS — I420 Dilated cardiomyopathy: Secondary | ICD-10-CM

## 2021-04-04 LAB — CUP PACEART REMOTE DEVICE CHECK
Battery Remaining Longevity: 4 mo
Battery Remaining Percentage: 7 %
Battery Voltage: 2.81 V
Date Time Interrogation Session: 20221004020018
Implantable Lead Implant Date: 20180329
Implantable Lead Implant Date: 20180329
Implantable Lead Implant Date: 20180329
Implantable Lead Location: 753859
Implantable Lead Location: 753860
Implantable Lead Location: 753860
Implantable Lead Model: 3830
Implantable Lead Model: 5076
Implantable Pulse Generator Implant Date: 20180329
Lead Channel Impedance Value: 350 Ohm
Lead Channel Impedance Value: 400 Ohm
Lead Channel Pacing Threshold Amplitude: 0.75 V
Lead Channel Pacing Threshold Amplitude: 2.25 V
Lead Channel Pacing Threshold Pulse Width: 0.5 ms
Lead Channel Pacing Threshold Pulse Width: 1 ms
Lead Channel Sensing Intrinsic Amplitude: 4.3 mV
Lead Channel Setting Pacing Amplitude: 2 V
Lead Channel Setting Pacing Amplitude: 3 V
Lead Channel Setting Pacing Pulse Width: 0.5 ms
Lead Channel Setting Pacing Pulse Width: 1 ms
Lead Channel Setting Sensing Sensitivity: 4 mV
Pulse Gen Model: 3222
Pulse Gen Serial Number: 8006933

## 2021-04-04 NOTE — Telephone Encounter (Signed)
"  Scheduled remote reviewed. Normal device function.   Battery voltage 3.9 months. [place]- Eliquis Routing for IFU (intensify follow up, nearing ERI), increase to monthly remote transmission monitoring"   Unsuccessful telephone encounter to patient to discuss increase of remote monitoring to monthly for monitoring battery longevity. Remote scheduled in [employer] and [employer]. Patient is an automatic send. Hip[employer] compliant VM message left requesting call back to [phone].

## 2021-04-04 NOTE — Telephone Encounter (Signed)
Left vm for pt to return call to schedule appt.

## 2021-04-04 NOTE — Telephone Encounter (Signed)
Successful telephone encounter to patient to discuss battery depletion and need for monthly remotes. During call patient complains of worsening chest tightness and pressure over the last week. States she has been taking 1 nitro with relief at least daily. This discomfort occurs with ADLs/exertion such as washing dishes. She sits down when discomfort occurs however if it does not resolve within a few minutes she takes a SL nitro. Reviewed CP protocol, how to take SL nitro x 3 doses and when to call 911. Patient able to verbalize instructions. Patient is overdue with EP App. Will send to scheduling for in-clinic appt. Routing to Dr. [name] for review of CP symptoms and advise.

## 2021-04-04 NOTE — Telephone Encounter (Signed)
With chest pain, patient needs to be seen in clinic this week.  See if she can work into APP clinic or my clinic, wherever there is an opening.

## 2021-04-12 ENCOUNTER — Other Ambulatory Visit: Payer: Self-pay

## 2021-04-12 ENCOUNTER — Ambulatory Visit (INDEPENDENT_AMBULATORY_CARE_PROVIDER_SITE_OTHER): Payer: PPO | Admitting: Student

## 2021-04-12 ENCOUNTER — Encounter: Payer: Self-pay | Admitting: Student

## 2021-04-12 VITALS — BP 112/74 | HR 60 | Ht 64.0 in | Wt 119.8 lb

## 2021-04-12 DIAGNOSIS — I442 Atrioventricular block, complete: Secondary | ICD-10-CM

## 2021-04-12 DIAGNOSIS — I5022 Chronic systolic (congestive) heart failure: Secondary | ICD-10-CM | POA: Diagnosis not present

## 2021-04-12 DIAGNOSIS — I4821 Permanent atrial fibrillation: Secondary | ICD-10-CM

## 2021-04-12 DIAGNOSIS — I2510 Atherosclerotic heart disease of native coronary artery without angina pectoris: Secondary | ICD-10-CM | POA: Diagnosis not present

## 2021-04-12 LAB — CUP PACEART INCLINIC DEVICE CHECK
Battery Remaining Longevity: 4 mo
Battery Voltage: 2.81 V
Brady Statistic RA Percent Paced: 0 %
Brady Statistic RV Percent Paced: 99.08 %
Date Time Interrogation Session: 20221012112635
Implantable Lead Implant Date: 20180329
Implantable Lead Implant Date: 20180329
Implantable Lead Implant Date: 20180329
Implantable Lead Location: 753859
Implantable Lead Location: 753860
Implantable Lead Location: 753860
Implantable Lead Model: 3830
Implantable Lead Model: 5076
Implantable Pulse Generator Implant Date: 20180329
Lead Channel Impedance Value: 400 Ohm
Lead Channel Impedance Value: 412.5 Ohm
Lead Channel Pacing Threshold Amplitude: 0.75 V
Lead Channel Pacing Threshold Amplitude: 0.75 V
Lead Channel Pacing Threshold Amplitude: 2 V
Lead Channel Pacing Threshold Amplitude: 2 V
Lead Channel Pacing Threshold Pulse Width: 0.5 ms
Lead Channel Pacing Threshold Pulse Width: 0.5 ms
Lead Channel Pacing Threshold Pulse Width: 1 ms
Lead Channel Pacing Threshold Pulse Width: 1 ms
Lead Channel Sensing Intrinsic Amplitude: 4.3 mV
Lead Channel Setting Pacing Amplitude: 2 V
Lead Channel Setting Pacing Amplitude: 3 V
Lead Channel Setting Pacing Pulse Width: 0.5 ms
Lead Channel Setting Pacing Pulse Width: 1 ms
Lead Channel Setting Sensing Sensitivity: 4 mV
Pulse Gen Model: 3222
Pulse Gen Serial Number: 8006933

## 2021-04-12 NOTE — Patient Instructions (Signed)
Medication Instructions:  Your physician recommends that you continue on your current medications as directed. Please refer to the Current Medication list given to you today.  *If you need a refill on your cardiac medications before your next appointment, please call your pharmacy*   Lab Work: None If you have labs (blood work) drawn today and your tests are completely normal, you will receive your results only by: [place] (if you have MyChart) OR A paper copy in the mail If you have any lab test that is abnormal or we need to change your treatment, we will call you to review the results.   Follow-Up: At [facility], you and your health needs are our priority.  As part of our continuing mission to provide you with exceptional heart care, we have created designated Provider Care Teams.  These Care Teams include your primary Cardiologist (physician) and Advanced Practice Providers (APPs -  Physician Assistants and Nurse Practitioners) who all work together to provide you with the care you need, when you need it.  We recommend signing up for the patient portal called "MyChart".  Sign up information is provided on this After Visit Summary.  MyChart is used to connect with patients for Virtual Visits (Telemedicine).  Patients are able to view lab/test results, encounter notes, upcoming appointments, etc.  Non-urgent messages can be sent to your provider as well.   To learn more about what you can do with MyChart, go to [email or web address].    Your next appointment:   4 month(s)  The format for your next appointment:   In Person  Provider:   Cristopher Peru, MD

## 2021-04-12 NOTE — Progress Notes (Signed)
Electrophysiology Office Note Date: 04/12/2021  ID:  Shannon [name], DOB 03-01-1932, MRN [id number]  PCP: Haywood Pao, MD Primary Cardiologist: Mertie Moores, MD Electrophysiologist: Thompson Grayer, MD   CC: Pacemaker follow-up  Shannon [name] is a 87 y.o. female seen today for Thompson Grayer, MD for routine electrophysiology followup.  Since last being seen in our clinic the patient reports doing well.  She states she misunderstood the question re: chest pain, and feels like this is stable.  She has had to attend several funerals in the past couple of months, and the week we called she had needed several NTG for stable angina. She has not used any in the past 2 weeks.  she denies palpitations, dyspnea, PND, orthopnea, nausea, vomiting, dizziness, syncope, edema, weight gain, or early satiety.  Device History: STJ CRTP implanted 2018 for CHF and permanent AF (AVN ablation at that time); pocket hematoma evacuation post procedure with device placed sub-pec  Past Medical History:  Diagnosis Date   Arthritis    Baker's cyst of knee    CAD (coronary artery disease)    a. 05/2006 Cath/PCI: LAD 25m, D1 40 ost, LCX nl, OM1 95 (3.0x20 Taxus DES), RCA nl, EF 40% w/ lat AK;  04/2007 Ex MV: minimal lat ischemia in area of prior infarct->low risk-> med Rx.   Chronic atrial fibrillation (HCC)    a. on Coumadin   Chronic systolic (congestive) heart failure ([place])    a. 04/2015: EF 50-55% b. echo 12/2015: EF 20-25% w/ diffuse HK, biatrial enlargement, mild AI and MR, severe TR     Diabetes mellitus    Hyperlipidemia    MVP (mitral valve prolapse)    a. 10/2003 Echo: nl LV fxn, mild MR/TR/AS   Myocardial infarction [facility])    Sleep apnea    Transient ischemic attack    Past Surgical History:  Procedure Laterality Date   ATRIAL FLUTTER ABLATION N/A 10/09/2012   Procedure: ATRIAL FLUTTER ABLATION;  Surgeon: Thompson Grayer, MD;  Location: [facility] CATH LAB;  Service: Cardiovascular;  Laterality:  N/A;   AV NODE ABLATION N/A 09/27/2016   Procedure: AV Node Ablation;  Surgeon: Thompson Grayer, MD;  Location: [facility] CV LAB;  Service: Cardiovascular;  Laterality: N/A;   BIV PACEMAKER INSERTION CRT-P N/A 09/27/2016   Procedure: BiV Pacemaker Insertion CRT-P;  Surgeon: Thompson Grayer, MD;  Location: [facility] CV LAB;  Service: Cardiovascular;  Laterality: N/A;   CARDIAC CATHETERIZATION N/A 01/06/2016   Procedure: Right/Left Heart Cath and Coronary Angiography;  Surgeon: Belva Crome, MD;  Location: [place] CV LAB;  Service: Cardiovascular;  Laterality: N/A;   CARDIOVERSION N/A 01/11/2016   Procedure: CARDIOVERSION;  Surgeon: Larey Dresser, MD;  Location: [facility];  Service: Cardiovascular;  Laterality: N/A;   cataracts     COLONOSCOPY N/A 10/13/2015   Procedure: COLONOSCOPY;  Surgeon: Manus Gunning, MD;  Location: [facility];  Service: Gastroenterology;  Laterality: N/A;   CORONARY ANGIOPLASTY     Status post PTCA and stenting of the first obtuse marginal-05/13/2006. We placed a 3.0 x 20 mm Taxus stent. It was post dilated using a 3.25 mm noncompliant balloon up to 14 atmospheres   IMPLANT POCKET INCISION & DRAINAGE N/A 11/02/2016   Procedure: Implant Pocket Incision & Drainage;  Surgeon: Evans Lance, MD;  Location: [facility] LAB;  Service: Cardiovascular;  Laterality: N/A;   TONSILLECTOMY      Current Outpatient Medications  Medication Sig Dispense Refill  ACCU-CHEK FASTCLIX LANCETS MISC USE TO CHECK BLOOD SUGAR TWICE DAILY  5   ACCU-CHEK SMARTVIEW test strip      atorvastatin (LIPITOR) 20 MG tablet TAKE 1 TABLET BY MOUTH EVERY DAY 90 tablet 2   bisoprolol (ZEBETA) 5 MG tablet Take 1 tablet (5 mg total) by mouth daily. 90 tablet 3   Cholecalciferol (VITAMIN D) 2000 units tablet Take 2,000 Units by mouth daily.     ELIQUIS 2.5 MG TABS tablet TAKE 1 TABLET BY MOUTH TWICE A DAY 60 tablet 11   empagliflozin (JARDIANCE) 10 MG TABS tablet Take 1 tablet (10 mg total)  by mouth daily before breakfast. 30 tablet 11   furosemide (LASIX) 20 MG tablet Take 1 tablet (20 mg total) by mouth daily. 90 tablet 3   metFORMIN (GLUCOPHAGE) 500 MG tablet Take 500 mg by mouth daily with supper.      nitroGLYCERIN (NITROSTAT) 0.4 MG SL tablet TAKE 1 TABLET BY MOUTH UNDER THE TONGUE EVERY 5 MINUTES FOR 3 DOSES AS NEEDED FOR CHEST PAIN 25 tablet 3   spironolactone (ALDACTONE) 25 MG tablet TAKE 1 TABLET (25 MG TOTAL) BY MOUTH DAILY. 30 tablet 11   vitamin C (ASCORBIC ACID) 500 MG tablet Take 500 mg by mouth daily.     No current facility-administered medications for this visit.    Allergies:   Amiodarone, Ranexa [ranolazine], Other, Demerol, Erythromycin, Penicillins, and Percocet [oxycodone-acetaminophen]   Social History: Social History   Socioeconomic History   Marital status: Married    Spouse name: Not on file   Number of children: Not on file   Years of education: Not on file   Highest education level: Not on file  Occupational History   Not on file  Tobacco Use   Smoking status: Former    Packs/day: 1.00    Types: Cigarettes    Quit date: 07/03/1963    Years since quitting: 57.8   Smokeless tobacco: Never  Substance and Sexual Activity   Alcohol use: No   Drug use: No   Sexual activity: Never  Other Topics Concern   Not on file  Social History Narrative   Lives in [place] with husband.  Does not routinely exercise but is able to keep house and go food shopping.   Social Determinants of Health   Financial Resource Strain: Not on file  Food Insecurity: Not on file  Transportation Needs: Not on file  Physical Activity: Not on file  Stress: Not on file  Social Connections: Not on file  Intimate Partner Violence: Not on file    Family History: Family History  Problem Relation Age of Onset   Heart attack Mother    Stroke Mother    Heart attack Father    Heart attack Brother    Coronary artery disease Sister        CABG   Alzheimer's disease  Sister        X3   Stroke Sister    Heart attack Sister      Review of Systems: All other systems reviewed and are otherwise negative except as noted above.  Physical Exam: Vitals:   04/12/21 1031  BP: 112/74  Pulse: 60  SpO2: 98%  Weight: 119 lb 12.8 oz (54.3 kg)  Height: 5\' 4"  (1.626 m)     GEN- The patient is well appearing, alert and oriented x 3 today.   HEENT: normocephalic, atraumatic; sclera clear, conjunctiva pink; hearing intact; oropharynx clear; neck supple  Lungs- Clear to ausculation  bilaterally, normal work of breathing.  No wheezes, rales, rhonchi Heart- Regular rate and rhythm, no murmurs, rubs or gallops  GI- soft, non-tender, non-distended, bowel sounds present  Extremities- no clubbing or cyanosis. No edema MS- no significant deformity or atrophy Skin- warm and dry, no rash or lesion; PPM pocket well healed Psych- euthymic mood, full affect Neuro- strength and sensation are intact  PPM Interrogation- reviewed in detail today,  See PACEART report  EKG:  EKG is ordered today. Personal review of ekg ordered today shows V pacing at 60 bpm   Recent Labs: 10/28/2020: BUN 27; Creatinine, Ser 1.66; Hemoglobin 12.8; Platelets 188; Potassium 4.1; Sodium 135   Wt Readings from Last 3 Encounters:  04/12/21 119 lb 12.8 oz (54.3 kg)  10/28/20 118 lb 3.2 oz (53.6 kg)  07/29/20 120 lb 3.2 oz (54.5 kg)     Other studies Reviewed: Additional studies/ records that were reviewed today include: Previous EP office notes, Previous remote checks, Most recent labwork.   Assessment and Plan:  1. CHB s/p AVN ablation and [employer] Jude PPM  Normal PPM function See Pace Art report No changes today She has a complicated lead configuration and sub-pectoral generator. She should see Dr. Lovena Le prior to gen change. Will schedule. Discussed with Dr. Rayann Heman who agrees.   2. Permament atrial fibrillation Continue eliquis for CHA2DS2/VASc of at least 7  3. NICM/ chronic systolic  CHF Echo 10/9199 55%  4. CAD: Patent OM stent on coronary angiography in 7/17.   Denies active s/s ischemia She has occasional stable type angina that improves with short rest or NTG. Not NTG in past 2 weeks.   Current medicines are reviewed at length with the patient today.    Labs/ tests ordered today include:  Orders Placed This Encounter  Procedures   EKG 12-Lead   Disposition:   Follow up with Dr. Lovena Le in 3-4 months     Signed, Shirley Friar, PA-C  04/12/2021 10:44 AM  [facility] [address] [place] [address] [phone] (office) [phone] (fax)

## 2021-04-21 ENCOUNTER — Telehealth (HOSPITAL_COMMUNITY): Payer: Self-pay | Admitting: Cardiology

## 2021-04-21 NOTE — Telephone Encounter (Signed)
[place] with [facility] [phone])  Called to report during patients last visit she expressed she could not afford jardiance or eliquis. Patient assistance forms started and faxed for provider signature. Once signed please return to [facility]  Will send to pharmacy team as Juluis Rainier

## 2021-05-01 DIAGNOSIS — E1151 Type 2 diabetes mellitus with diabetic peripheral angiopathy without gangrene: Secondary | ICD-10-CM | POA: Diagnosis not present

## 2021-05-01 DIAGNOSIS — I2510 Atherosclerotic heart disease of native coronary artery without angina pectoris: Secondary | ICD-10-CM | POA: Diagnosis not present

## 2021-05-01 DIAGNOSIS — I1310 Hypertensive heart and chronic kidney disease without heart failure, with stage 1 through stage 4 chronic kidney disease, or unspecified chronic kidney disease: Secondary | ICD-10-CM | POA: Diagnosis not present

## 2021-05-01 DIAGNOSIS — N1832 Chronic kidney disease, stage 3b: Secondary | ICD-10-CM | POA: Diagnosis not present

## 2021-05-08 ENCOUNTER — Ambulatory Visit (INDEPENDENT_AMBULATORY_CARE_PROVIDER_SITE_OTHER): Payer: PPO

## 2021-05-08 DIAGNOSIS — I442 Atrioventricular block, complete: Secondary | ICD-10-CM

## 2021-05-08 LAB — CUP PACEART REMOTE DEVICE CHECK
Battery Remaining Longevity: 4 mo
Battery Remaining Percentage: 6 %
Battery Voltage: 2.8 V
Date Time Interrogation Session: 20221107010023
Implantable Lead Implant Date: 20180329
Implantable Lead Implant Date: 20180329
Implantable Lead Implant Date: 20180329
Implantable Lead Location: 753859
Implantable Lead Location: 753860
Implantable Lead Location: 753860
Implantable Lead Model: 3830
Implantable Lead Model: 5076
Implantable Pulse Generator Implant Date: 20180329
Lead Channel Impedance Value: 390 Ohm
Lead Channel Impedance Value: 410 Ohm
Lead Channel Pacing Threshold Amplitude: 0.75 V
Lead Channel Pacing Threshold Amplitude: 2 V
Lead Channel Pacing Threshold Pulse Width: 0.5 ms
Lead Channel Pacing Threshold Pulse Width: 1 ms
Lead Channel Sensing Intrinsic Amplitude: 4.3 mV
Lead Channel Setting Pacing Amplitude: 2 V
Lead Channel Setting Pacing Amplitude: 3 V
Lead Channel Setting Pacing Pulse Width: 0.5 ms
Lead Channel Setting Pacing Pulse Width: 1 ms
Lead Channel Setting Sensing Sensitivity: 4 mV
Pulse Gen Model: 3222
Pulse Gen Serial Number: 8006933

## 2021-05-11 DIAGNOSIS — R5381 Other malaise: Secondary | ICD-10-CM | POA: Diagnosis not present

## 2021-05-11 DIAGNOSIS — Z66 Do not resuscitate: Secondary | ICD-10-CM | POA: Diagnosis not present

## 2021-05-15 NOTE — Progress Notes (Signed)
Remote pacemaker transmission.   

## 2021-05-22 ENCOUNTER — Other Ambulatory Visit: Payer: Self-pay | Admitting: Cardiovascular Disease

## 2021-05-31 ENCOUNTER — Telehealth: Payer: Self-pay

## 2021-05-31 NOTE — Telephone Encounter (Signed)
Successful telephone call to patient to discuss monthly battery checks and device longevity. Patient states her energy level is worsening however feels it is related to her inactivity. Patient's son was extremely concerned that patients device was related to how she is feeling. Most recent transmission reviewed. Battery life 05/08/21 remains at 4 months with normal device function. Patient states she is not concerned but wanted to provide her son with reassurance. Attempted to assist patient with sending manual transmission. Unsuccessful. Patient aware to call [employer] for assistance as she has done this in the past. Will continue to monitor.

## 2021-05-31 NOTE — Telephone Encounter (Signed)
Patient called in stating she has not been feeling well. Patient states she has been very tired and not feeling like herself. Patient is on monthly battery checks and has a change out scheduled and she thinks it may be too far out and thinks it will die before her appointment.

## 2021-06-12 ENCOUNTER — Ambulatory Visit (INDEPENDENT_AMBULATORY_CARE_PROVIDER_SITE_OTHER): Payer: PPO

## 2021-06-12 DIAGNOSIS — I420 Dilated cardiomyopathy: Secondary | ICD-10-CM

## 2021-06-13 LAB — CUP PACEART REMOTE DEVICE CHECK
Battery Remaining Longevity: 3 mo
Battery Remaining Percentage: 6 %
Battery Voltage: 2.78 V
Date Time Interrogation Session: 20221212020014
Implantable Lead Implant Date: 20180329
Implantable Lead Implant Date: 20180329
Implantable Lead Implant Date: 20180329
Implantable Lead Location: 753859
Implantable Lead Location: 753860
Implantable Lead Location: 753860
Implantable Lead Model: 3830
Implantable Lead Model: 5076
Implantable Pulse Generator Implant Date: 20180329
Lead Channel Impedance Value: 360 Ohm
Lead Channel Impedance Value: 410 Ohm
Lead Channel Pacing Threshold Amplitude: 0.75 V
Lead Channel Pacing Threshold Amplitude: 2 V
Lead Channel Pacing Threshold Pulse Width: 0.5 ms
Lead Channel Pacing Threshold Pulse Width: 1 ms
Lead Channel Sensing Intrinsic Amplitude: 4.6 mV
Lead Channel Setting Pacing Amplitude: 2 V
Lead Channel Setting Pacing Amplitude: 3 V
Lead Channel Setting Pacing Pulse Width: 0.5 ms
Lead Channel Setting Pacing Pulse Width: 1 ms
Lead Channel Setting Sensing Sensitivity: 4 mV
Pulse Gen Model: 3222
Pulse Gen Serial Number: 8006933

## 2021-06-18 ENCOUNTER — Other Ambulatory Visit: Payer: Self-pay | Admitting: Cardiovascular Disease

## 2021-06-22 NOTE — Addendum Note (Signed)
Addended by: Douglass Rivers D on: 06/22/2021 02:42 PM   Modules accepted: Level of Service

## 2021-06-22 NOTE — Progress Notes (Signed)
Remote pacemaker transmission.   

## 2021-07-11 DIAGNOSIS — N184 Chronic kidney disease, stage 4 (severe): Secondary | ICD-10-CM | POA: Diagnosis not present

## 2021-07-11 DIAGNOSIS — I482 Chronic atrial fibrillation, unspecified: Secondary | ICD-10-CM | POA: Diagnosis not present

## 2021-07-11 DIAGNOSIS — I2510 Atherosclerotic heart disease of native coronary artery without angina pectoris: Secondary | ICD-10-CM | POA: Diagnosis not present

## 2021-07-11 DIAGNOSIS — I509 Heart failure, unspecified: Secondary | ICD-10-CM | POA: Diagnosis not present

## 2021-07-11 DIAGNOSIS — E1140 Type 2 diabetes mellitus with diabetic neuropathy, unspecified: Secondary | ICD-10-CM | POA: Diagnosis not present

## 2021-07-11 DIAGNOSIS — I739 Peripheral vascular disease, unspecified: Secondary | ICD-10-CM | POA: Diagnosis not present

## 2021-07-11 DIAGNOSIS — I209 Angina pectoris, unspecified: Secondary | ICD-10-CM | POA: Diagnosis not present

## 2021-07-11 DIAGNOSIS — Z23 Encounter for immunization: Secondary | ICD-10-CM | POA: Diagnosis not present

## 2021-07-11 DIAGNOSIS — I87319 Chronic venous hypertension (idiopathic) with ulcer of unspecified lower extremity: Secondary | ICD-10-CM | POA: Diagnosis not present

## 2021-07-11 DIAGNOSIS — I1310 Hypertensive heart and chronic kidney disease without heart failure, with stage 1 through stage 4 chronic kidney disease, or unspecified chronic kidney disease: Secondary | ICD-10-CM | POA: Diagnosis not present

## 2021-07-11 DIAGNOSIS — Z7901 Long term (current) use of anticoagulants: Secondary | ICD-10-CM | POA: Diagnosis not present

## 2021-07-11 DIAGNOSIS — Z1339 Encounter for screening examination for other mental health and behavioral disorders: Secondary | ICD-10-CM | POA: Diagnosis not present

## 2021-07-11 DIAGNOSIS — D638 Anemia in other chronic diseases classified elsewhere: Secondary | ICD-10-CM | POA: Diagnosis not present

## 2021-07-11 DIAGNOSIS — Z1331 Encounter for screening for depression: Secondary | ICD-10-CM | POA: Diagnosis not present

## 2021-07-11 DIAGNOSIS — E1151 Type 2 diabetes mellitus with diabetic peripheral angiopathy without gangrene: Secondary | ICD-10-CM | POA: Diagnosis not present

## 2021-07-17 ENCOUNTER — Ambulatory Visit (INDEPENDENT_AMBULATORY_CARE_PROVIDER_SITE_OTHER): Payer: PPO

## 2021-07-17 DIAGNOSIS — I420 Dilated cardiomyopathy: Secondary | ICD-10-CM | POA: Diagnosis not present

## 2021-07-17 DIAGNOSIS — I5022 Chronic systolic (congestive) heart failure: Secondary | ICD-10-CM

## 2021-07-18 LAB — CUP PACEART REMOTE DEVICE CHECK
Battery Remaining Longevity: 1 mo
Battery Remaining Percentage: 4 %
Battery Voltage: 2.75 V
Date Time Interrogation Session: 20230116020020
Implantable Lead Implant Date: 20180329
Implantable Lead Implant Date: 20180329
Implantable Lead Implant Date: 20180329
Implantable Lead Location: 753859
Implantable Lead Location: 753860
Implantable Lead Location: 753860
Implantable Lead Model: 3830
Implantable Lead Model: 5076
Implantable Pulse Generator Implant Date: 20180329
Lead Channel Impedance Value: 390 Ohm
Lead Channel Impedance Value: 400 Ohm
Lead Channel Pacing Threshold Amplitude: 0.75 V
Lead Channel Pacing Threshold Amplitude: 2 V
Lead Channel Pacing Threshold Pulse Width: 0.5 ms
Lead Channel Pacing Threshold Pulse Width: 1 ms
Lead Channel Sensing Intrinsic Amplitude: 6.7 mV
Lead Channel Setting Pacing Amplitude: 2 V
Lead Channel Setting Pacing Amplitude: 3 V
Lead Channel Setting Pacing Pulse Width: 0.5 ms
Lead Channel Setting Pacing Pulse Width: 1 ms
Lead Channel Setting Sensing Sensitivity: 4 mV
Pulse Gen Model: 3222
Pulse Gen Serial Number: 8006933

## 2021-07-19 ENCOUNTER — Other Ambulatory Visit (HOSPITAL_COMMUNITY): Payer: Self-pay | Admitting: Cardiology

## 2021-07-19 ENCOUNTER — Other Ambulatory Visit: Payer: Self-pay | Admitting: Cardiovascular Disease

## 2021-07-25 ENCOUNTER — Telehealth (HOSPITAL_COMMUNITY): Payer: Self-pay | Admitting: *Deleted

## 2021-07-25 NOTE — Telephone Encounter (Signed)
Received VM pt has swelling in her feet and ankles but is only up 1lb.  No other complaints at this time.  Routed to [place] for advice

## 2021-07-25 NOTE — Telephone Encounter (Signed)
She can increase Lasix to 40 mg daily x 3 days then back to 20 mg daily.

## 2021-07-25 NOTE — Telephone Encounter (Signed)
Pt aware and agreeable with plan.  

## 2021-07-26 ENCOUNTER — Other Ambulatory Visit: Payer: Self-pay | Admitting: Cardiovascular Disease

## 2021-07-27 DIAGNOSIS — I5022 Chronic systolic (congestive) heart failure: Secondary | ICD-10-CM | POA: Diagnosis not present

## 2021-07-27 DIAGNOSIS — Z515 Encounter for palliative care: Secondary | ICD-10-CM | POA: Diagnosis not present

## 2021-07-28 NOTE — Progress Notes (Signed)
Remote pacemaker transmission.   

## 2021-08-03 ENCOUNTER — Encounter: Payer: PPO | Admitting: Internal Medicine

## 2021-08-12 ENCOUNTER — Other Ambulatory Visit (HOSPITAL_COMMUNITY): Payer: Self-pay | Admitting: Cardiology

## 2021-08-14 ENCOUNTER — Other Ambulatory Visit: Payer: Self-pay | Admitting: Cardiovascular Disease

## 2021-08-16 ENCOUNTER — Telehealth: Payer: Self-pay | Admitting: Cardiovascular Disease

## 2021-08-16 MED ORDER — ATORVASTATIN CALCIUM 20 MG PO TABS: ORAL_TABLET | ORAL | 0 refills | Status: DC

## 2021-08-16 NOTE — Telephone Encounter (Signed)
Pt's medication was sent to pt's pharmacy as requested. Confirmation received.  °

## 2021-08-16 NOTE — Telephone Encounter (Signed)
°*  STAT* If patient is at the pharmacy, call can be transferred to refill team.   1. Which medications need to be refilled? (please list name of each medication and dose if known) atorvastatin (LIPITOR) 20 MG tablet  2. Which pharmacy/location (including street and city if local pharmacy) is medication to be sent to? [id number] - [place], [place] - [address] AT [address]OSS FROM [address]  3. Do they need a 30 day or 90 day supply? [place]

## 2021-08-20 NOTE — Progress Notes (Signed)
Cardiology Office Note Date:  08/20/2021  Patient ID:  Torre, Pikus 03-Nov-1931, MRN [id number] PCP:  Haywood Pao, MD  Cardiologist:  Dr. Acie Fredrickson HF: Dr. [name] Electrophysiologist: Dr. Rayann Heman    Chief Complaint:  4 mo  History of Present Illness: Shannon [name] is a 88 y.o. female with history of CAD (PCI LAD, cx 2007), HTN, HLD, DM, TIA, AFib, NICM, chronic CHF (w/recovered LVEF),   She comes in today to be seen for Dr. Rayann Heman, she saw A. Sula Rumple Oct 2022, planned to scheduled f/u with Dr. Lovena Le prior to gen change given complex lead configuration and a sub-pec device.  TODAY She continues to do quite well. She lives with her son, helps care for the house, does some chores, keeps off the couch, though paces herself. She has infrequent sharp CP that are random and fleeting, infrequently will take a s/l NTG, this sounds chronic and unchanging. She paces herself, generally able to all of her ADLs some days she feels more "lazy"  No SOB, no near syncope or syncope.  She reports rarely a drop of blood when blowing her nose, a few days ago when cleaning her ears she had a speck of blood on the qtip, no bleeding or signs of bleeding otherwise.  She would like to transition to Dr. Lovena Le who did her pocket evacuation and relocation back in 2018  Device information [employer] CRT-P, implanted 09/27/2016 RA lead is a Tendrill 2088TC, 3830 lead in LV port is in the HIS position, and a 5076 in RV port at the apex (Unable to place CS lead) AV node ablation PROGRAMMED VVIR Implant complicated by pocket hematoma, required evacuation and device then placed sub-pec  AAD Hx Amiodarone, stopped 2017 2/2 concerns of pulmonary tox  Past Medical History:  Diagnosis Date   Arthritis    Baker's cyst of knee    CAD (coronary artery disease)    a. 05/2006 Cath/PCI: LAD 74m, D1 40 ost, LCX nl, OM1 95 (3.0x20 Taxus DES), RCA nl, EF 40% w/ lat AK;  04/2007 Ex MV: minimal lat  ischemia in area of prior infarct->low risk-> med Rx.   Chronic atrial fibrillation (HCC)    a. on Coumadin   Chronic systolic (congestive) heart failure ([place])    a. 04/2015: EF 50-55% b. echo 12/2015: EF 20-25% w/ diffuse HK, biatrial enlargement, mild AI and MR, severe TR     Diabetes mellitus    Hyperlipidemia    MVP (mitral valve prolapse)    a. 10/2003 Echo: nl LV fxn, mild MR/TR/AS   Myocardial infarction [facility])    Sleep apnea    Transient ischemic attack     Past Surgical History:  Procedure Laterality Date   ATRIAL FLUTTER ABLATION N/A 10/09/2012   Procedure: ATRIAL FLUTTER ABLATION;  Surgeon: Thompson Grayer, MD;  Location: [facility] CATH LAB;  Service: Cardiovascular;  Laterality: N/A;   AV NODE ABLATION N/A 09/27/2016   Procedure: AV Node Ablation;  Surgeon: Thompson Grayer, MD;  Location: [place] [facility] CV LAB;  Service: Cardiovascular;  Laterality: N/A;   BIV PACEMAKER INSERTION CRT-P N/A 09/27/2016   Procedure: BiV Pacemaker Insertion CRT-P;  Surgeon: Thompson Grayer, MD;  Location: [facility] CV LAB;  Service: Cardiovascular;  Laterality: N/A;   CARDIAC CATHETERIZATION N/A 01/06/2016   Procedure: Right/Left Heart Cath and Coronary Angiography;  Surgeon: Belva Crome, MD;  Location: [facility] CV LAB;  Service: Cardiovascular;  Laterality: N/A;   CARDIOVERSION N/A 01/11/2016   Procedure:  CARDIOVERSION;  Surgeon: Larey Dresser, MD;  Location: [facility];  Service: Cardiovascular;  Laterality: N/A;   cataracts     COLONOSCOPY N/A 10/13/2015   Procedure: COLONOSCOPY;  Surgeon: Manus Gunning, MD;  Location: [facility];  Service: Gastroenterology;  Laterality: N/A;   CORONARY ANGIOPLASTY     Status post PTCA and stenting of the first obtuse marginal-05/13/2006. We placed a 3.0 x 20 mm Taxus stent. It was post dilated using a 3.25 mm noncompliant balloon up to 14 atmospheres   IMPLANT POCKET INCISION & DRAINAGE N/A 11/02/2016   Procedure: Implant Pocket Incision & Drainage;  Surgeon:  Evans Lance, MD;  Location: [facility] [facility] CV LAB;  Service: Cardiovascular;  Laterality: N/A;   TONSILLECTOMY      Current Outpatient Medications  Medication Sig Dispense Refill   ACCU-CHEK FASTCLIX LANCETS MISC USE TO CHECK BLOOD SUGAR TWICE DAILY  5   ACCU-CHEK SMARTVIEW test strip      atorvastatin (LIPITOR) 20 MG tablet Take 1 tablet by mouth daily. Please keep upcoming appt with Cardiologist in March 2023 before anymore refills. Thank You. Final attempt. 30 tablet 0   bisoprolol (ZEBETA) 5 MG tablet Take 1 tablet (5 mg total) by mouth daily. 90 tablet 3   Cholecalciferol (VITAMIN D) 2000 units tablet Take 2,000 Units by mouth daily.     ELIQUIS 2.5 MG TABS tablet TAKE 1 TABLET BY MOUTH TWICE A DAY 60 tablet 11   furosemide (LASIX) 20 MG tablet Take 1 tablet (20 mg total) by mouth daily. 90 tablet 3   JARDIANCE 10 MG TABS tablet TAKE 1 TABLET (10 MG TOTAL) BY MOUTH DAILY BEFORE BREAKFAST. NEEDS FOLLOW UP APPOINTMENT FOR MORE REFILLS 15 tablet 0   metFORMIN (GLUCOPHAGE) 500 MG tablet Take 500 mg by mouth daily with supper.      nitroGLYCERIN (NITROSTAT) 0.4 MG SL tablet TAKE 1 TABLET BY MOUTH UNDER THE TONGUE EVERY 5 MINUTES FOR 3 DOSES AS NEEDED FOR CHEST PAIN 25 tablet 3   spironolactone (ALDACTONE) 25 MG tablet TAKE 1 TABLET (25 MG TOTAL) BY MOUTH DAILY. 30 tablet 11   vitamin C (ASCORBIC ACID) 500 MG tablet Take 500 mg by mouth daily.     No current facility-administered medications for this visit.    Allergies:   Amiodarone, Ranexa [ranolazine], Other, Demerol, Erythromycin, Penicillins, and Percocet [oxycodone-acetaminophen]   Social History:  The patient  reports that she quit smoking about 58 years ago. Her smoking use included cigarettes. She smoked an average of 1 pack per day. She has never used smokeless tobacco. She reports that she does not drink alcohol and does not use drugs.   Family History:  The patient's family history includes Alzheimer's disease in her sister;  Coronary artery disease in her sister; Heart attack in her brother, father, mother, and sister; Stroke in her mother and sister.  ROS:  Please see the history of present illness.    All other systems are reviewed and otherwise negative.   PHYSICAL EXAM:  VS:  There were no vitals taken for this visit. BMI: There is no height or weight on file to calculate BMI. Well nourished, well developed, in no acute distress HEENT: normocephalic, atraumatic Neck: no JVD, carotid bruits or masses Cardiac:  RRR; no significant murmurs, no rubs, or gallops Lungs:  CTA b/l, no wheezing, rhonchi or rales Abd: soft, nontender MS: no deformity, age appropriate atrophy, with a very hin body habitus Ext: no edema Skin: warm and dry, no  rash Neuro:  No gross deficits appreciated Psych: euthymic mood, full affect  PPM site is stable, no tethering or discomfort   EKG:  not done today  Device interrogation done today and reviewed by myself:  Battery has not yet reached ERI, voltage 2.69 (ERI is 2.62) Lead measurements are good. Variable on the HIS lead (in the LV port)1st threshold 2.75, then 2.0, then 1.75 (this was repeated twice) nonselective at 2.0 and LOC at 1.75 RV lead threshold is excellent I left HIS lead output at 3.0 No HVR episodes No R waves today at 40, rarely a VS beat during her interrogation noted  10/28/2020: TTE  1. Left ventricular ejection fraction, by estimation, is 55%. The left  ventricle has normal function. The left ventricle has no regional wall  motion abnormalities. There is mild left ventricular hypertrophy. Left  ventricular diastolic parameters are  indeterminate.   2. Right ventricular systolic function is mildly reduced. The right  ventricular size is mildly enlarged. There is mildly elevated pulmonary  artery systolic pressure. The estimated right ventricular systolic  pressure is 97.9 mmHg.   3. Left atrial size was severely dilated.   4. Right atrial size was  severely dilated.   5. The mitral valve is degenerative. Mild mitral valve regurgitation. No  evidence of mitral stenosis. Moderate mitral annular calcification.   6. Tricuspid valve regurgitation is moderate.   7. The aortic valve is tricuspid. Aortic valve regurgitation is trivial.  Mild aortic valve sclerosis is present, with no evidence of aortic valve  stenosis.   8. The inferior vena cava is normal in size with greater than 50%  respiratory variability, suggesting right atrial pressure of 3 mmHg.   Recent Labs: 10/28/2020: BUN 27; Creatinine, Ser 1.66; Hemoglobin 12.8; Platelets 188; Potassium 4.1; Sodium 135  No results found for requested labs within last 8760 hours.   CrCl cannot be calculated (Patient's most recent lab result is older than the maximum 21 days allowed.).   Wt Readings from Last 3 Encounters:  04/12/21 119 lb 12.8 oz (54.3 kg)  10/28/20 118 lb 3.2 oz (53.6 kg)  07/29/20 120 lb 3.2 oz (54.5 kg)     Other studies reviewed: Additional studies/records reviewed today include: summarized above  ASSESSMENT AND PLAN:  PPM nearing ERI Will have her back in 41mo to see Dr. Lovena Le, probably about ready then for her gen change We discussed that procedure today, potential risks/benefits, she is agreeable when the time comes  Permanent Afib CHA2DS2Vasc is 9, on Eliquis,  appropriately dosed for age/weight Rate controlled s/p AV node ablation  CAD Stable CP, sounds atypical Sees Kathleen Argue next month  NICM Chronic CHF 9systolic) She has had recovery of her LVEF by her last echo No symptoms or exam findings of volume OL CorVue tends to wobble at threshold, currentlyl below, but exam does not correlate   Disposition: F/u with monthly remotes/battery checks, in clinic in 43mo, sooner if needed  Current medicines are reviewed at length with the patient today.  The patient did not have any concerns regarding medicines.  Venetia Night, PA-C 08/20/2021 4:23 PM      [place] [address] [phone] (office)  [phone] (fax)

## 2021-08-21 ENCOUNTER — Ambulatory Visit (INDEPENDENT_AMBULATORY_CARE_PROVIDER_SITE_OTHER): Payer: PPO

## 2021-08-21 DIAGNOSIS — I420 Dilated cardiomyopathy: Secondary | ICD-10-CM

## 2021-08-21 DIAGNOSIS — I5022 Chronic systolic (congestive) heart failure: Secondary | ICD-10-CM

## 2021-08-22 LAB — CUP PACEART REMOTE DEVICE CHECK
Battery Remaining Longevity: 1 mo
Battery Remaining Percentage: 2 %
Battery Voltage: 2.69 V
Date Time Interrogation Session: 20230221105844
Implantable Lead Implant Date: 20180329
Implantable Lead Implant Date: 20180329
Implantable Lead Implant Date: 20180329
Implantable Lead Location: 753859
Implantable Lead Location: 753860
Implantable Lead Location: 753860
Implantable Lead Model: 3830
Implantable Lead Model: 5076
Implantable Pulse Generator Implant Date: 20180329
Lead Channel Impedance Value: 360 Ohm
Lead Channel Impedance Value: 400 Ohm
Lead Channel Pacing Threshold Amplitude: 0.75 V
Lead Channel Pacing Threshold Amplitude: 2 V
Lead Channel Pacing Threshold Pulse Width: 0.5 ms
Lead Channel Pacing Threshold Pulse Width: 1 ms
Lead Channel Sensing Intrinsic Amplitude: 5.6 mV
Lead Channel Setting Pacing Amplitude: 2 V
Lead Channel Setting Pacing Amplitude: 3 V
Lead Channel Setting Pacing Pulse Width: 0.5 ms
Lead Channel Setting Pacing Pulse Width: 1 ms
Lead Channel Setting Sensing Sensitivity: 4 mV
Pulse Gen Model: 3222
Pulse Gen Serial Number: 8006933

## 2021-08-24 ENCOUNTER — Other Ambulatory Visit: Payer: Self-pay

## 2021-08-24 ENCOUNTER — Encounter: Payer: Self-pay | Admitting: Physician Assistant

## 2021-08-24 ENCOUNTER — Ambulatory Visit: Payer: PPO | Admitting: Physician Assistant

## 2021-08-24 VITALS — BP 124/64 | HR 62 | Ht 64.0 in | Wt 117.4 lb

## 2021-08-24 DIAGNOSIS — I428 Other cardiomyopathies: Secondary | ICD-10-CM | POA: Diagnosis not present

## 2021-08-24 DIAGNOSIS — I5022 Chronic systolic (congestive) heart failure: Secondary | ICD-10-CM | POA: Diagnosis not present

## 2021-08-24 DIAGNOSIS — Z950 Presence of cardiac pacemaker: Secondary | ICD-10-CM | POA: Diagnosis not present

## 2021-08-24 DIAGNOSIS — I483 Typical atrial flutter: Secondary | ICD-10-CM

## 2021-08-24 DIAGNOSIS — I2510 Atherosclerotic heart disease of native coronary artery without angina pectoris: Secondary | ICD-10-CM

## 2021-08-24 DIAGNOSIS — I4821 Permanent atrial fibrillation: Secondary | ICD-10-CM | POA: Diagnosis not present

## 2021-08-24 LAB — CUP PACEART INCLINIC DEVICE CHECK
Battery Remaining Longevity: 1 mo
Battery Voltage: 2.69 V
Brady Statistic RA Percent Paced: 0 %
Brady Statistic RV Percent Paced: 99.06 %
Date Time Interrogation Session: 20230223103515
Implantable Lead Implant Date: 20180329
Implantable Lead Implant Date: 20180329
Implantable Lead Implant Date: 20180329
Implantable Lead Location: 753859
Implantable Lead Location: 753860
Implantable Lead Location: 753860
Implantable Lead Model: 3830
Implantable Lead Model: 5076
Implantable Pulse Generator Implant Date: 20180329
Lead Channel Impedance Value: 362.5 Ohm
Lead Channel Impedance Value: 400 Ohm
Lead Channel Pacing Threshold Amplitude: 1 V
Lead Channel Pacing Threshold Amplitude: 1 V
Lead Channel Pacing Threshold Amplitude: 1.75 V
Lead Channel Pacing Threshold Amplitude: 1.75 V
Lead Channel Pacing Threshold Pulse Width: 0.5 ms
Lead Channel Pacing Threshold Pulse Width: 0.5 ms
Lead Channel Pacing Threshold Pulse Width: 1 ms
Lead Channel Pacing Threshold Pulse Width: 1 ms
Lead Channel Sensing Intrinsic Amplitude: 8.1 mV
Lead Channel Setting Pacing Amplitude: 2 V
Lead Channel Setting Pacing Amplitude: 3 V
Lead Channel Setting Pacing Pulse Width: 0.5 ms
Lead Channel Setting Pacing Pulse Width: 1 ms
Lead Channel Setting Sensing Sensitivity: 4 mV
Pulse Gen Model: 3222
Pulse Gen Serial Number: 8006933

## 2021-08-24 NOTE — Patient Instructions (Signed)
Medication Instructions:   Your physician recommends that you continue on your current medications as directed. Please refer to the Current Medication list given to you today.  *If you need a refill on your cardiac medications before your next appointment, please call your pharmacy*   Lab Work: [place]   If you have labs (blood work) drawn today and your tests are completely normal, you will receive your results only by: [place] (if you have MyChart) OR A paper copy in the mail If you have any lab test that is abnormal or we need to change your treatment, we will call you to review the results.   Testing/Procedures:  NONE ORDERED  TODAY   Follow-Up: At [facility], you and your health needs are our priority.  As part of our continuing mission to provide you with exceptional heart care, we have created designated Provider Care Teams.  These Care Teams include your primary Cardiologist (physician) and Advanced Practice Providers (APPs -  Physician Assistants and Nurse Practitioners) who all work together to provide you with the care you need, when you need it.  We recommend signing up for the patient portal called "MyChart".  Sign up information is provided on this After Visit Summary.  MyChart is used to connect with patients for Virtual Visits (Telemedicine).  Patients are able to view lab/test results, encounter notes, upcoming appointments, etc.  Non-urgent messages can be sent to your provider as well.   To learn more about what you can do with MyChart, go to [email or web address].    Your next appointment:   2 month(s)  ( CONTACT [facility] FOR EP [place] )   The format for your next appointment:   In Person  Provider:   Cristopher Peru, MD{    Other Instructions

## 2021-08-24 NOTE — Progress Notes (Signed)
Remote pacemaker transmission.   

## 2021-08-24 NOTE — Addendum Note (Signed)
Addended by: Douglass Rivers D on: 08/24/2021 01:23 PM   Modules accepted: Level of Service

## 2021-08-29 DIAGNOSIS — I2510 Atherosclerotic heart disease of native coronary artery without angina pectoris: Secondary | ICD-10-CM | POA: Diagnosis not present

## 2021-08-29 DIAGNOSIS — I1310 Hypertensive heart and chronic kidney disease without heart failure, with stage 1 through stage 4 chronic kidney disease, or unspecified chronic kidney disease: Secondary | ICD-10-CM | POA: Diagnosis not present

## 2021-08-29 DIAGNOSIS — N1832 Chronic kidney disease, stage 3b: Secondary | ICD-10-CM | POA: Diagnosis not present

## 2021-08-29 DIAGNOSIS — I482 Chronic atrial fibrillation, unspecified: Secondary | ICD-10-CM | POA: Diagnosis not present

## 2021-09-02 ENCOUNTER — Other Ambulatory Visit: Payer: Self-pay | Admitting: Cardiovascular Disease

## 2021-09-02 ENCOUNTER — Other Ambulatory Visit (HOSPITAL_COMMUNITY): Payer: Self-pay | Admitting: Cardiology

## 2021-09-11 NOTE — Progress Notes (Unsigned)
Cardiology Office Note:    Date:  09/11/2021   ID:  Shannon [name], DOB 08-Jan-1932, MRN [id number]  PCP:  Haywood Pao, MD  [facility] Providers Cardiologist:  Mertie Moores, MD Electrophysiologist:  Thompson Grayer, MD { Click to update primary MD,subspecialty MD or APP then REFRESH:1}  *** Referring MD: Haywood Pao, MD   Chief Complaint:  No chief complaint on file. {Click here for Visit Info    :1}   Patient Profile: Persistent atrial fibrillation Amiodarone DC'd 2/2 ? 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  EF in 2017: 25 EF in 2019: 60-65 EF in 2020: 55-60 Intol of Losartan at low dose Coronary artery disease  S/p DES to OM in 2007 Cath in 2017: OM stent patent; no obstructive CAD  Mod MR Mod TR Mild pulmonary hypertension  Diabetes mellitus  Hyperlipidemia  Hx of TIA   Prior CV Studies: Echocardiogram 10/28/20 EF 55, no RWMA, mild LVH, mild reduced RVSF, mildly elevated PASP, RVSP 40, severe BAE, mild MR, mod TR, trivial AI, AV sclerosis w/o AS  Cardiac catheterization  01/16/2016 OM stent patent EF 25   {Select studies to display:26339}   History of Present Illness:   Shannon [name] is a 88 y.o. female with the above problem list.  She is followed in the AHF clinic and was last seen by Dr. [name] in 4/22.  She has a f/u with him in May.  She was last seen by Tommye Standard, PA-C for EP in 2/23.  She noted chest pain at that visit.    ***        Past Medical History:  Diagnosis Date   Arthritis    Baker's cyst of knee    CAD (coronary artery disease)    a. 05/2006 Cath/PCI: LAD 44m D1 40 ost, LCX nl, OM1 95 (3.0x20 Taxus DES), RCA nl, EF 40% w/ lat AK;  04/2007 Ex MV: minimal lat ischemia in  area of prior infarct->low risk-> med Rx.   Chronic atrial fibrillation (HCC)    a. on Coumadin   [place]    a. 04/2015: EF 50-55% b. echo 12/2015: EF 20-25% w/ diffuse HK, biatrial enlargement, mild AI and MR, severe TR     Diabetes mellitus    Hyperlipidemia    MVP (mitral valve prolapse)    a. 10/2003 Echo: nl LV fxn, mild MR/TR/AS   Myocardial infarction ([facility]    Sleep apnea    Transient ischemic attack    Current Medications: No outpatient medications have been marked as taking for the 09/12/21 encounter (Appointment) with WRichardson DoppT, PA-C.    Allergies:   Amiodarone, Ranexa [ranolazine], Other, Demerol, Erythromycin, Penicillins, and Percocet [oxycodone-acetaminophen]   Social History   Tobacco Use   Smoking status: Former    Packs/day: 1.00    Types: Cigarettes    Quit date: 07/03/1963    Years since quitting: 58.2   Smokeless tobacco: Never  Substance Use Topics   Alcohol use: No   Drug use: No    Family Hx: The patient's family history includes Alzheimer's disease in her sister; Coronary artery disease in her sister; Heart attack in her brother, father, mother, and sister; Stroke in her mother  and sister.  ROS   EKGs/Labs/Other Test Reviewed:    EKG:  EKG is *** ordered today.  The ekg ordered today demonstrates ***  Recent Labs: 10/28/2020: BUN 27; Creatinine, Ser 1.66; Hemoglobin 12.8; Platelets 188; Potassium 4.1; Sodium 135   Recent Lipid Panel No results for input(s): CHOL, TRIG, HDL, VLDL, LDLCALC, LDLDIRECT in the last 8760 hours.   Risk Assessment/Calculations:   {Does this patient have ATRIAL FIBRILLATION?:[phone]}     Physical Exam:    VS:  There were no vitals taken for this visit.    Wt Readings from Last 3 Encounters:  08/24/21 117 lb 6.4 oz (53.3 kg)  04/12/21 119 lb 12.8 oz (54.3 kg)  10/28/20 118 lb 3.2 oz (53.6 kg)    Physical Exam ***     ASSESSMENT & PLAN:   No problem-specific  Assessment & Plan notes found for this encounter.        {Are you ordering a CV Procedure (e.g. stress test, cath, DCCV, TEE, etc)?   Press F2        :[id number]}  Dispo:  No follow-ups on file.   Medication Adjustments/Labs and Tests Ordered: Current medicines are reviewed at length with the patient today.  Concerns regarding medicines are outlined above.  Tests Ordered: No orders of the defined types were placed in this encounter.  Medication Changes: No orders of the defined types were placed in this encounter.  Signed, Richardson Dopp, PA-C  09/11/2021 9:34 PM    [facility] [place], [place], [place]  [address] Phone: [phone]; Fax: [phone]

## 2021-09-12 ENCOUNTER — Ambulatory Visit: Payer: PPO | Admitting: Physician Assistant

## 2021-09-12 ENCOUNTER — Encounter: Payer: Self-pay | Admitting: Physician Assistant

## 2021-09-12 ENCOUNTER — Other Ambulatory Visit: Payer: Self-pay

## 2021-09-12 VITALS — BP 96/50 | HR 62 | Ht 62.0 in | Wt 119.4 lb

## 2021-09-12 DIAGNOSIS — I480 Paroxysmal atrial fibrillation: Secondary | ICD-10-CM | POA: Diagnosis not present

## 2021-09-12 DIAGNOSIS — E785 Hyperlipidemia, unspecified: Secondary | ICD-10-CM | POA: Diagnosis not present

## 2021-09-12 DIAGNOSIS — I2510 Atherosclerotic heart disease of native coronary artery without angina pectoris: Secondary | ICD-10-CM

## 2021-09-12 DIAGNOSIS — I10 Essential (primary) hypertension: Secondary | ICD-10-CM | POA: Diagnosis not present

## 2021-09-12 DIAGNOSIS — I5020 Unspecified systolic (congestive) heart failure: Secondary | ICD-10-CM | POA: Diagnosis not present

## 2021-09-12 NOTE — Assessment & Plan Note (Signed)
LDL in 6/22 was 38.  Continue atorvastatin 20 mg daily. ?

## 2021-09-12 NOTE — Assessment & Plan Note (Signed)
History of PCI to the OM and 2007.  Catheterization in 2017 demonstrated nonobstructive CAD and patent stent in the OM.  She has been having occasional chest pains.  These are fairly brief and sound noncardiac.  She has not had exertional symptoms.  We discussed +/- proceeding with stress testing.  She prefers to hold off for now.  I think that this is reasonable.  She knows to contact us if her symptoms should progress/worsen.  She is not on aspirin as she is on Apixaban.  Continue bisoprolol 5 mg daily, atorvastatin 20 mg daily. ?

## 2021-09-12 NOTE — Assessment & Plan Note (Signed)
She had poor rate control and ultimately underwent AV nodal ablation with implantation of biventricular pacemaker.  Rate is controlled.  She is tolerating anticoagulation.  Continue apixaban 2.5 mg twice daily (age, weight, creatinine).  Request most recent labs from primary care from January 2023. ?

## 2021-09-12 NOTE — Assessment & Plan Note (Signed)
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  EF improved from 25 to normal.  She has had difficulty tolerating losartan even at the low dose.  She has been managed in the heart failure clinic.  Continue spironolactone 25 mg daily, empagliflozin 10 mg daily, bisoprolol 5 mg daily.  Follow-up with heart failure clinic as planned. ?

## 2021-09-12 NOTE — Patient Instructions (Addendum)
Medication Instructions:  ?Your physician recommends that you continue on your current medications as directed. Please refer to the Current Medication list given to you today. ? ?*If you need a refill on your cardiac medications before your next appointment, please call your pharmacy* ? ? ?Lab Work: ?None ordered ? ?If you have labs (blood work) drawn today and your tests are completely normal, you will receive your results only by: ?MyChart Message (if you have MyChart) OR ?A paper copy in the mail ?If you have any lab test that is abnormal or we need to change your treatment, we will call you to review the results. ? ? ?Testing/Procedures: ?None ordered ? ? ?Follow-Up: ?At [facility], you and your health needs are our priority.  As part of our continuing mission to provide you with exceptional heart care, we have created designated Provider Care Teams.  These Care Teams include your primary Cardiologist (physician) and Advanced Practice Providers (APPs -  Physician Assistants and Nurse Practitioners) who all work together to provide you with the care you need, when you need it. ? ?We recommend signing up for the patient portal called "MyChart".  Sign up information is provided on this After Visit Summary.  MyChart is used to connect with patients for Virtual Visits (Telemedicine).  Patients are able to view lab/test results, encounter notes, upcoming appointments, etc.  Non-urgent messages can be sent to your provider as well.   ?To learn more about what you can do with MyChart, go to [email or web address].   ? ?Your next appointment:   ?12 month(s) ? ?The format for your next appointment:   ?In Person ? ?Provider:   ?Mertie Moores, MD  ? ? ?Other Instructions ? ?

## 2021-09-12 NOTE — Assessment & Plan Note (Signed)
She tends to run low blood pressure.  She is currently asymptomatic.  Continue bisoprolol 5 mg daily, spironolactone 25 mg daily. ?

## 2021-09-14 ENCOUNTER — Other Ambulatory Visit (HOSPITAL_COMMUNITY): Payer: Self-pay | Admitting: Cardiology

## 2021-09-19 ENCOUNTER — Telehealth: Payer: Self-pay | Admitting: *Deleted

## 2021-09-19 NOTE — Telephone Encounter (Signed)
-----   Message from Liliane Shi, [place] sent at 09/14/2021  9:20 AM EDT ----- ?See Dalton's note. ?She can see Kirk Ruths for all non EP cardiology care/follow up. ?She can see me or Dr. Acie Fredrickson as needed. ?Can you let her know?  We talked about it at her OV. ?Thanks ?Scott ? ?----- Message ----- ?From: Larey Dresser, MD ?Sent: 09/12/2021  10:59 AM EDT ?To: Liliane Shi, PA-C, Thayer Headings, MD ? ?EF still low and still having some HF issues, can continue to see her here ? ?----- Message ----- ?From: Liliane Shi, PA-C ?Sent: 09/12/2021  10:52 AM EDT ?To: Larey Dresser, MD, Thayer Headings, MD ? ?Christophe Louis ?She has been seen in the HF clinic for a few years now.  She was scheduled with me here today for gen cards f/u.  Do you guys want her to continue f/u here and the HF clinic or should she just see Kirk Ruths and come back here as needed? ?Thanks! ?Scott  ? ? ? ?

## 2021-09-19 NOTE — Telephone Encounter (Signed)
Pt has been made aware she can see Dalton for all non EP cardiology care/follow up.  ?She can see me or Dr. Acie Fredrickson as needed.  ? ?Pt thanked me for letting her know. ?

## 2021-09-25 ENCOUNTER — Ambulatory Visit (INDEPENDENT_AMBULATORY_CARE_PROVIDER_SITE_OTHER): Payer: PPO

## 2021-09-25 DIAGNOSIS — I428 Other cardiomyopathies: Secondary | ICD-10-CM

## 2021-09-25 DIAGNOSIS — I5022 Chronic systolic (congestive) heart failure: Secondary | ICD-10-CM

## 2021-09-26 LAB — CUP PACEART REMOTE DEVICE CHECK
Battery Remaining Longevity: 1 mo
Battery Remaining Percentage: 1 %
Battery Voltage: 2.66 V
Date Time Interrogation Session: 20230327020029
Implantable Lead Implant Date: 20180329
Implantable Lead Implant Date: 20180329
Implantable Lead Implant Date: 20180329
Implantable Lead Location: 753859
Implantable Lead Location: 753860
Implantable Lead Location: 753860
Implantable Lead Model: 3830
Implantable Lead Model: 5076
Implantable Pulse Generator Implant Date: 20180329
Lead Channel Impedance Value: 390 Ohm
Lead Channel Impedance Value: 400 Ohm
Lead Channel Pacing Threshold Amplitude: 1 V
Lead Channel Pacing Threshold Amplitude: 1.75 V
Lead Channel Pacing Threshold Pulse Width: 0.5 ms
Lead Channel Pacing Threshold Pulse Width: 1 ms
Lead Channel Sensing Intrinsic Amplitude: 4.9 mV
Lead Channel Setting Pacing Amplitude: 2 V
Lead Channel Setting Pacing Amplitude: 3 V
Lead Channel Setting Pacing Pulse Width: 0.5 ms
Lead Channel Setting Pacing Pulse Width: 1 ms
Lead Channel Setting Sensing Sensitivity: 4 mV
Pulse Gen Model: 3222
Pulse Gen Serial Number: 8006933

## 2021-10-05 NOTE — Progress Notes (Signed)
Remote pacemaker transmission.   

## 2021-10-05 NOTE — Addendum Note (Signed)
Addended by: Douglass Rivers D on: 10/05/2021 01:32 PM ? ? Modules accepted: Level of Service ? ?

## 2021-10-08 ENCOUNTER — Other Ambulatory Visit (HOSPITAL_COMMUNITY): Payer: Self-pay | Admitting: Cardiology

## 2021-10-10 ENCOUNTER — Other Ambulatory Visit: Payer: Self-pay | Admitting: Cardiovascular Disease

## 2021-10-17 ENCOUNTER — Other Ambulatory Visit (HOSPITAL_COMMUNITY): Payer: Self-pay | Admitting: Cardiology

## 2021-10-20 ENCOUNTER — Telehealth: Payer: Self-pay

## 2021-10-20 NOTE — Telephone Encounter (Addendum)
Abbott alert for ERI reached 4/20 ?Presenting rhythm AF, Eliquis ?Corvue trending down ?Route to triage ?LA ? ?Successful telephone encounter to patient to discuss device at RRT status. Patient has appointment scheduled with GT 11/06/21 at 12:15 to discuss. Patient appreciative of call.  ? ? ? ? ?

## 2021-10-30 ENCOUNTER — Ambulatory Visit (INDEPENDENT_AMBULATORY_CARE_PROVIDER_SITE_OTHER): Payer: PPO

## 2021-10-30 DIAGNOSIS — I428 Other cardiomyopathies: Secondary | ICD-10-CM

## 2021-11-03 LAB — CUP PACEART REMOTE DEVICE CHECK
Battery Remaining Longevity: 0 mo
Battery Voltage: 2.6 V
Date Time Interrogation Session: 20230505140633
Implantable Lead Implant Date: 20180329
Implantable Lead Implant Date: 20180329
Implantable Lead Implant Date: 20180329
Implantable Lead Location: 753859
Implantable Lead Location: 753860
Implantable Lead Location: 753860
Implantable Lead Model: 3830
Implantable Lead Model: 5076
Implantable Pulse Generator Implant Date: 20180329
Lead Channel Impedance Value: 360 Ohm
Lead Channel Impedance Value: 400 Ohm
Lead Channel Pacing Threshold Amplitude: 1 V
Lead Channel Pacing Threshold Amplitude: 1.75 V
Lead Channel Pacing Threshold Pulse Width: 0.5 ms
Lead Channel Pacing Threshold Pulse Width: 1 ms
Lead Channel Sensing Intrinsic Amplitude: 4.9 mV
Lead Channel Setting Pacing Amplitude: 2 V
Lead Channel Setting Pacing Amplitude: 3 V
Lead Channel Setting Pacing Pulse Width: 0.5 ms
Lead Channel Setting Pacing Pulse Width: 1 ms
Lead Channel Setting Sensing Sensitivity: 4 mV
Pulse Gen Model: 3222
Pulse Gen Serial Number: 8006933

## 2021-11-06 ENCOUNTER — Ambulatory Visit: Payer: PPO | Admitting: Internal Medicine

## 2021-11-06 ENCOUNTER — Encounter: Payer: Self-pay | Admitting: Internal Medicine

## 2021-11-06 VITALS — BP 102/62 | HR 55 | Ht 62.0 in | Wt 121.8 lb

## 2021-11-06 DIAGNOSIS — I442 Atrioventricular block, complete: Secondary | ICD-10-CM

## 2021-11-06 DIAGNOSIS — Z950 Presence of cardiac pacemaker: Secondary | ICD-10-CM

## 2021-11-06 DIAGNOSIS — I4819 Other persistent atrial fibrillation: Secondary | ICD-10-CM | POA: Diagnosis not present

## 2021-11-06 LAB — BASIC METABOLIC PANEL
BUN/Creatinine Ratio: 20 (ref 12–28)
BUN: 33 mg/dL — ABNORMAL HIGH (ref 8–27)
CO2: 25 mmol/L (ref 20–29)
Calcium: 9.4 mg/dL (ref 8.7–10.3)
Chloride: 104 mmol/L (ref 96–106)
Creatinine, Ser: 1.63 mg/dL — ABNORMAL HIGH (ref 0.57–1.00)
Glucose: 187 mg/dL — ABNORMAL HIGH (ref 70–99)
Potassium: 4.3 mmol/L (ref 3.5–5.2)
Sodium: 138 mmol/L (ref 134–144)
eGFR: 30 mL/min/{1.73_m2} — ABNORMAL LOW (ref >59)

## 2021-11-06 LAB — CBC WITH DIFFERENTIAL/PLATELET
Basophils Absolute: 0 10*3/uL (ref 0.0–0.2)
Basos: 0 %
EOS (ABSOLUTE): 0 10*3/uL (ref 0.0–0.4)
Eos: 0 %
Hematocrit: 36.1 % (ref 34.0–46.6)
Hemoglobin: 12.4 g/dL (ref 11.1–15.9)
Lymphocytes Absolute: 0.8 10*3/uL (ref 0.7–3.1)
Lymphs: 11 %
MCH: 35.6 pg — ABNORMAL HIGH (ref 26.6–33.0)
MCHC: 34.3 g/dL (ref 31.5–35.7)
MCV: 104 fL — ABNORMAL HIGH (ref 79–97)
Monocytes Absolute: 0.6 10*3/uL (ref 0.1–0.9)
Monocytes: 8 %
Neutrophils Absolute: 6.1 10*3/uL (ref 1.4–7.0)
Neutrophils: 81 %
Platelets: 151 10*3/uL (ref 150–450)
RBC: 3.48 x10E6/uL — ABNORMAL LOW (ref 3.77–5.28)
RDW: 16.2 % — ABNORMAL HIGH (ref 11.7–15.4)
WBC: 7.6 10*3/uL (ref 3.4–10.8)

## 2021-11-06 NOTE — Patient Instructions (Addendum)
Medication Instructions:  ?Your physician recommends that you continue on your current medications as directed. Please refer to the Current Medication list given to you today. ? ?Labwork: ?You will get lab work today:  CBC and BMP ? ?Testing/Procedures: ?None ordered. ? ?Follow-Up: ? ?SEE INSTRUCTION LETTER ? ?Any Other Special Instructions Will Be Listed Below (If Applicable). ? ?If you need a refill on your cardiac medications before your next appointment, please call your pharmacy.  ? ?Important Information About Sugar ? ? ? ? ? ?Pacemaker Battery Change ? ?A pacemaker battery usually lasts 5-15 years (6-7 years on average). A few times a year, you may be asked to visit your health care provider to have a full evaluation of your pacemaker. When the battery is low, your pacemaker will be completely replaced. Most often, this procedure is simpler than the first surgery because the wires (leads) that connect the pacemaker to the heart are already in place. ?There are many things that affect how long a pacemaker battery will last, including: ?The age of the pacemaker. ?The number of leads you have(1, 2, or 3). ?The use or workload of the pacemaker. If the pacemaker is helping the heart more often, the battery will not last as long. ?Power (voltage) settings. ?Tell a health care provider about: ?Any allergies you have. ?All medicines you are taking, including vitamins, herbs, eye drops, creams, and over-the-counter medicines. ?Any problems you or family members have had with anesthetic medicines. ?Any blood disorders you have. ?Any surgeries you have had, especially any surgeries you have had since your last pacemaker was placed. ?Any medical conditions you have. ?Whether you are pregnant or may be pregnant. ?What are the risks? ?Generally, this is a safe procedure. However, problems may occur, including: ?Bleeding. ?Infection. ?Nerve damage. ?Allergic reaction to medicines. ?Damage to the leads that go to the  heart. ?What happens before the procedure? ?Staying hydrated ?Follow instructions from your health care provider about hydration, which may include: ?Up to 2 hours before the procedure - you may continue to drink clear liquids, such as water, clear fruit juice, black coffee, and plain tea. ?Eating and drinking restrictions ?Follow instructions from your health care provider about eating and drinking restrictions, which may include: ?8 hours before the procedure - stop eating heavy meals or foods, such as meat, fried foods, or fatty foods. ?6 hours before the procedure - stop eating light meals or foods, such as toast or cereal. ?6 hours before the procedure - stop drinking milk or drinks that contain milk. ?2 hours before the procedure - stop drinking clear liquids. ?Medicines ?Ask your health care provider about: ?Changing or stopping your regular medicines. This is especially important if you are taking diabetes medicines or blood thinners. ?Taking medicines such as aspirin and ibuprofen. These medicines can thin your blood. Do not take these medicines unless your health care provider tells you to take them. ?Taking over-the-counter medicines, vitamins, herbs, and supplements. ?General instructions ?Ask your health care provider what steps will be taken to help prevent infection. These may include: ?Removing hair at the surgery site. ?Washing skin with a germ-killing soap. ?Receiving antibiotic medicine. ?Plan to have someone take you home from the hospital or clinic. ?If you will be going home right after the procedure, plan to have someone with you for 24 hours. ?What happens during the procedure? ?An IV will be inserted into one of your veins. ?You will be given one or more of the following: ?A medicine to help you  relax (sedative). ?A medicine to numb the area where the pacemaker is located (local anesthetic). ?Your health care provider will make an incision to reopen the pocket holding the pacemaker. ?The old  pacemaker will be disconnected from the leads. ?The leads will be tested. ?If needed, the leads will be replaced. If the leads are functioning properly, the new pacemaker will be connected to the existing leads. ?A heart monitor and a pacemaker programmer will be used to make sure that the newly implanted pacemaker is working properly. ?The incision site will be closed with stitches (sutures), adhesive strips, or skin glue. ?A bandage (dressing) will be placed over the pacemaker site. ?The procedure may vary among health care providers and hospitals. ?What happens after the procedure? ?Your blood pressure, heart rate, breathing rate, and blood oxygen level will be monitored until you leave the hospital or clinic. ?You may be given antibiotics. ?Your health care provider will tell you when your pacemaker will need to be tested again, or when to return to the office for removal of the dressing and sutures. ?If you were given a sedative during the procedure, it can affect you for several hours. Do not drive or operate machinery until your health care provider says that it is safe. ?You will be given a pacemaker identification card. This card lists the implant date, device model, and manufacturer of your pacemaker. ?Summary ?A pacemaker battery usually lasts 5-15 years (6-7 years on average). ?When the battery is low, your pacemaker will need to be replaced. ?Most often, this procedure is simpler than the first surgery because the wires (leads) that connect the pacemaker to the heart are already in place. ?Risks of this procedure include bleeding, infection, and allergic reactions to medicines. ?This information is not intended to replace advice given to you by your health care provider. Make sure you discuss any questions you have with your health care provider. ?Document Revised: 05/21/2019 Document Reviewed: 05/21/2019 ?Elsevier Patient Education ? [place]. ? ? ?

## 2021-11-06 NOTE — Progress Notes (Signed)
? ? ? ? ?HPI ?Shannon [name] returns today after a long absence from our EP clinic. She is a pleasant 88 yo woman with a h/o CHB and chronic systolic heart failure s/p PPM insertion. I saw her last 5 years ago when she had to have a pacemaker pocket hematoma evac and relocation of her pocket to a sub pec location. She has now reached ERI>  ?Allergies  ?Allergen Reactions  ? Amiodarone Anaphylaxis  ? Ranexa [Ranolazine] Nausea And Vomiting  ? Other Other (See Comments)  ?  Vitamin E - causes heart rate to increase  ? Demerol Other (See Comments)  ?  Burning sensation all over body  ? Erythromycin Nausea And Vomiting  ? Penicillins Other (See Comments)  ?  unknown  ? Percocet [Oxycodone-Acetaminophen] Swelling  ? ? ? ?Current Outpatient Medications  ?Medication Sig Dispense Refill  ? ACCU-CHEK FASTCLIX LANCETS MISC USE TO CHECK BLOOD SUGAR TWICE DAILY  5  ? ACCU-CHEK SMARTVIEW test strip     ? atorvastatin (LIPITOR) 20 MG tablet TAKE 1 TABLET EVERY DAY 30 tablet 11  ? bisoprolol (ZEBETA) 5 MG tablet Take 1 tablet (5 mg total) by mouth daily. 90 tablet 3  ? Cholecalciferol (VITAMIN D) 2000 units tablet Take 2,000 Units by mouth daily.    ? ELIQUIS 2.5 MG TABS tablet TAKE 1 TABLET BY MOUTH TWICE A DAY 60 tablet 11  ? empagliflozin (JARDIANCE) 10 MG TABS tablet Take 1 tablet (10 mg total) by mouth daily. 30 tablet 11  ? furosemide (LASIX) 20 MG tablet TAKE 20 MG (1 TAB) ONCE DAILY ALTERNATING WITH 40 MG (2 TABS) 270 tablet 1  ? metFORMIN (GLUCOPHAGE) 500 MG tablet Take 500 mg by mouth daily with supper.     ? nitroGLYCERIN (NITROSTAT) 0.4 MG SL tablet TAKE 1 TABLET BY MOUTH UNDER THE TONGUE EVERY 5 MINUTES FOR 3 DOSES AS NEEDED FOR CHEST PAIN 25 tablet 3  ? spironolactone (ALDACTONE) 25 MG tablet TAKE 1 TABLET (25 MG TOTAL) BY MOUTH DAILY. 90 tablet 3  ? vitamin C (ASCORBIC ACID) 500 MG tablet Take 500 mg by mouth daily.    ? ?No current facility-administered medications for this visit.  ? ? ? ?Past Medical History:   ?Diagnosis Date  ? Arthritis   ? Baker's cyst of knee   ? CAD (coronary artery disease)   ? a. 05/2006 Cath/PCI: LAD 30m D1 40 ost, LCX nl, OM1 95 (3.0x20 Taxus DES), RCA nl, EF 40% w/ lat AK;  04/2007 Ex MV: minimal lat ischemia in area of prior infarct->low risk-> med Rx.  ? Chronic atrial fibrillation (HCC)   ? a. on Coumadin  ? Chronic systolic (congestive) heart failure (HCC)   ? a. 04/2015: EF 50-55% b. echo 12/2015: EF 20-25% w/ diffuse HK, biatrial enlargement, mild AI and MR, severe TR    ? Diabetes mellitus   ? Hyperlipidemia   ? MVP (mitral valve prolapse)   ? a. 10/2003 Echo: nl LV fxn, mild MR/TR/AS  ? Myocardial infarction ([facility]   ? Sleep apnea   ? Transient ischemic attack   ? ? ?ROS: ? ? All systems reviewed and negative except as noted in the HPI. ? ? ?Past Surgical History:  ?Procedure Laterality Date  ? ATRIAL FLUTTER ABLATION N/A 10/09/2012  ? Procedure: ATRIAL FLUTTER ABLATION;  Surgeon: JThompson Grayer MD;  [facility];  Service: Cardiovascular;  Laterality: N/A;  ? AV NODE ABLATION N/A 09/27/2016  ? Procedure: AV Node Ablation;  Surgeon: Thompson Grayer, MD;  Location: [place] CV LAB;  Service: Cardiovascular;  Laterality: N/A;  ? BIV PACEMAKER INSERTION CRT-P N/A 09/27/2016  ? Procedure: BiV Pacemaker Insertion CRT-P;  Surgeon: Thompson Grayer, MD;  Location: [place] CV LAB;  Service: Cardiovascular;  Laterality: N/A;  ? CARDIAC CATHETERIZATION N/A 01/06/2016  ? Procedure: Right/Left Heart Cath and Coronary Angiography;  Surgeon: Belva Crome, MD;  Location: [place] CV LAB;  Service: Cardiovascular;  Laterality: N/A;  ? CARDIOVERSION N/A 01/11/2016  ? Procedure: CARDIOVERSION;  Surgeon: Larey Dresser, MD;  Location: [facility] ENDOSCOPY;  Service: Cardiovascular;  Laterality: N/A;  ? cataracts    ? COLONOSCOPY N/A 10/13/2015  ? Procedure: COLONOSCOPY;  Surgeon: Manus Gunning, MD;  Location: [facility] ENDOSCOPY;  Service: Gastroenterology;  Laterality: N/A;  ? CORONARY ANGIOPLASTY    ?  Status post PTCA and stenting of the first obtuse marginal-05/13/2006. We placed a 3.0 x 20 mm Taxus stent. It was post dilated using a 3.25 mm noncompliant balloon up to 14 atmospheres  ? IMPLANT POCKET INCISION & DRAINAGE N/A 11/02/2016  ? Procedure: Implant Pocket Incision & Drainage;  Surgeon: Evans Lance, MD;  Location: [place] CV LAB;  Service: Cardiovascular;  Laterality: N/A;  ? TONSILLECTOMY    ? ? ? ?Family History  ?Problem Relation Age of Onset  ? Heart attack Mother   ? Stroke Mother   ? Heart attack Father   ? Heart attack Brother   ? Coronary artery disease Sister   ?     CABG  ? Alzheimer's disease Sister   ?     X3  ? Stroke Sister   ? Heart attack Sister   ? ? ? ?Social History  ? ?Socioeconomic History  ? Marital status: Married  ?  Spouse name: Not on file  ? Number of children: Not on file  ? Years of education: Not on file  ? Highest education level: Not on file  ?Occupational History  ? Not on file  ?Tobacco Use  ? Smoking status: Former  ?  Packs/day: 1.00  ?  Types: Cigarettes  ?  Quit date: 07/03/1963  ?  Years since quitting: 58.3  ? Smokeless tobacco: Never  ?Substance and Sexual Activity  ? Alcohol use: No  ? Drug use: No  ? Sexual activity: Never  ?Other Topics Concern  ? Not on file  ?Social History Narrative  ? Lives in [place] with husband.  Does not routinely exercise but is able to keep house and go food shopping.  ? ?Social Determinants of Health  ? ?Financial Resource Strain: Not on file  ?Food Insecurity: Not on file  ?Transportation Needs: Not on file  ?Physical Activity: Not on file  ?Stress: Not on file  ?Social Connections: Not on file  ?Intimate Partner Violence: Not on file  ? ? ? ?BP 102/62   Pulse (!) 55   Ht '5\' 2"'$  (1.575 m)   Wt 121 lb 12.8 oz (55.2 kg)   SpO2 99%   BMI 22.28 kg/m?  ? ?Physical Exam: ? ?Well appearing NAD ?HEENT: Unremarkable ?Neck:  No JVD, no thyromegally ?Lymphatics:  No adenopathy ?Back:  No CVA tenderness ?Lungs:  Clear ?HEART:  Regular rate  rhythm, no murmurs, no rubs, no clicks ?Abd:  soft, positive bowel sounds, no organomegally, no rebound, no guarding ?Ext:  2 plus pulses, no edema, no cyanosis, no clubbing ?Skin:  No rashes no nodules ?Neuro:  CN II through XII intact, motor  grossly intact ? ?EKG - atrial fib with ventricular pacing ? ?DEVICE  ?Normal device function.  See PaceArt for details. She is at ERI ? ?Assess/Plan:  ?Atrial fib - her VR is well controlled and her atrial fib is permanent. She has no escape.  ?CHB - she has no escape. We will follow ?CAD - she is s/p PCI but has no angina ?Dyslipidemia - she will continue atorvastatin.  ? ?Carleene Overlie Lisl S[place],MD ?

## 2021-11-07 ENCOUNTER — Ambulatory Visit (HOSPITAL_COMMUNITY)
Admission: RE | Admit: 2021-11-07 | Discharge: 2021-11-07 | Disposition: A | Discharge status: Home / Self Care | Payer: PPO | Source: Ambulatory Visit | Attending: Cardiology | Admitting: Cardiology

## 2021-11-07 ENCOUNTER — Encounter (HOSPITAL_COMMUNITY): Payer: Self-pay | Admitting: Cardiology

## 2021-11-07 VITALS — BP 100/60 | HR 55 | Wt 121.6 lb

## 2021-11-07 DIAGNOSIS — E1136 Type 2 diabetes mellitus with diabetic cataract: Secondary | ICD-10-CM | POA: Diagnosis not present

## 2021-11-07 DIAGNOSIS — N183 Chronic kidney disease, stage 3 unspecified: Secondary | ICD-10-CM | POA: Diagnosis not present

## 2021-11-07 DIAGNOSIS — Z7901 Long term (current) use of anticoagulants: Secondary | ICD-10-CM | POA: Diagnosis not present

## 2021-11-07 DIAGNOSIS — Z79899 Other long term (current) drug therapy: Secondary | ICD-10-CM | POA: Diagnosis not present

## 2021-11-07 DIAGNOSIS — I4819 Other persistent atrial fibrillation: Secondary | ICD-10-CM | POA: Insufficient documentation

## 2021-11-07 DIAGNOSIS — I4892 Unspecified atrial flutter: Secondary | ICD-10-CM | POA: Insufficient documentation

## 2021-11-07 DIAGNOSIS — I428 Other cardiomyopathies: Secondary | ICD-10-CM | POA: Diagnosis not present

## 2021-11-07 DIAGNOSIS — I4821 Permanent atrial fibrillation: Secondary | ICD-10-CM

## 2021-11-07 DIAGNOSIS — I2510 Atherosclerotic heart disease of native coronary artery without angina pectoris: Secondary | ICD-10-CM | POA: Diagnosis not present

## 2021-11-07 DIAGNOSIS — E1122 Type 2 diabetes mellitus with diabetic chronic kidney disease: Secondary | ICD-10-CM | POA: Insufficient documentation

## 2021-11-07 DIAGNOSIS — I5042 Chronic combined systolic (congestive) and diastolic (congestive) heart failure: Secondary | ICD-10-CM | POA: Diagnosis not present

## 2021-11-07 DIAGNOSIS — I5020 Unspecified systolic (congestive) heart failure: Secondary | ICD-10-CM

## 2021-11-07 MED ORDER — POTASSIUM CHLORIDE CRYS ER 20 MEQ PO TBCR: 20.0000 meq | EXTENDED_RELEASE_TABLET | Freq: Every day | ORAL | 3 refills | Status: DC

## 2021-11-07 MED ORDER — FUROSEMIDE 20 MG PO TABS: 40.0000 mg | ORAL_TABLET | Freq: Every day | ORAL | 3 refills | Status: DC

## 2021-11-07 NOTE — Progress Notes (Signed)
?  ? ? ?ID:  Shannon [name], DOB [birth year], MRN [id number]  ?Provider location: [place] [facility] ?Type of Visit: Established patient  ? ?PCP:  Tisovec, Fransico Him, MD  ?Cardiologist:  Mertie Moores, MD ?Primary HF: Dr. [name] ? ?Chief Complaint: Shortness of breath ?  ?History of Present Illness: ?Shannon [name] is a 88 y.o. female who has a history of paroxysmal atrial fibrillation, chronic systolic CHF, and CAD. She apparently had been in persistent atrial fibrillation since around 8/16.  It appears that she was tachycardic most of the time prior to her recent admission in 7/17.  She had been on amiodarone prior to admission but it was not keeping her in NSR.  She was admitted with acute systolic CHF.  EF had dropped to 20-25% in 7/17 from 50-55% in 10/16.  She was in atrial fibrillation with RVR.  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  Amiodarone was stopped due to suspicion for amiodarone-induced lung toxicity (she was seen by pulmonary).  She was eventually cardioverted to NSR.  Ranolazine was started to try to keep her in NSR.  HR was low in the hospital so she has not been on a beta blocker.  She was extensively diuresed and discharged to SNF.  After discharge, she developed persistent nausea.  Eventually, it was found that the nausea was likely from ranolazine, so this medication was stopped and the nausea resolved.   ?  ?Patient continued to have difficult-to-control atrial fibrillation.  GFR was too low for Tikosyn.  She therefore had AV nodal ablation with St Jude CRT-P device placed in 3/18.  She developed a pocket hematoma and had pocket revision with sub-pectoral placement in 5/18.  ?  ?Echo in 9/19 showed EF up to 60-65%, moderate MR, moderate TR, PASP 43 mmHg. Echo in 10/20 showed EF 55-60%, moderately decreased RV systolic function with moderate RV enlargement, moderate-severe biatrial enlargement, moderate-severe TR, mild MR.   ? ?Echo in 4/22 showed EF 55% with mild LVH, mild RV enlargement with mildly decreased RV systolic function, severe biatrial enlargement, mild MR, moderate TR.  ?  ?Patient returns for followup of CHF.  She was doing well up until about 3 wks ago.  Starting 3 wks ago, she has developed increasing exertional dyspnea.  She says that she is short of breath after walking 15-20 feet.  She has been orthopneic. She has eaten [place] food a couple of days recently.   She has had episodes of chest pain lasting for a few minutes, she has taken NTG x 3 in the last 3 wks.  Weight up about 3 lbs.  She is not lightheaded.   She increased Lasix to 40 mg daily x 3 days and felt better.  Now back on 20 mg daily.  ? ?REDS clip ?31% ? ?ECG (personally reviewed): afib, BiV paced ?  ?Labs (7/17): Digoxin 1.6 => 0.3, K 4.6, creatinine 0.87 => 0.76, HCT 34, BNP 265 ?Labs (9/17): K 5, creatinine 1.36 ?Labs (11/17): K 4.9, creatinine 1.29, hgb 9.2, digoxin 0.5 ?Labs (12/17): hgb 10.4, digoxin 0.4 ?Labs (1/18): K 4, creatinine 1.62 => 1.49, hgb 9.5, LDL 42, HDL 63 ?Labs (3/18): K 4.1, creatinine 1.44, digoxin 0.4 ?Labs (6/18): K 3.9, creatinine 1.7, hgb 11.5, LDL 56, HDL 50 ?Labs (6/19): K 4.4, creatinine 1.7, hgb 12.8, LDL 54 ?Labs (10/19): K 4.2, creatinine 1.46 ?Labs (6/21): K 4.6, creatinine 1.5, LDL 43, HDL 51 ?Labs (3/22): BNP 635, LFTs normal,  K 3.8, creatinine 1.81 ?Labs (5/23): K 4.3, creatinine 1.63 ?   ?PMH: ?1. Atrial fibrillation: Paroxysmal.  Had been persistent since 8/16, was cardioverted to NSR during 7/17 admission.  Possible atypical atrial flutter on 12/17 ECG.  By 3/18, atrial fibrillation again.  ?- Nausea with ranolazine used for atrial fibrillation suppression.  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  ?- AV nodal ablation 3/18 with CRT-P.  ?2. 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  She had been in atrial fibrillation with elevated rate possibly for months prior to  7/17 admission.  ?- Echo (10/16): EF 50-55%.  ?- Echo (7/17): EF 20-25%, mildly decreased RV systolic function, severe TR, PA systolic pressure 64 mmHg.  ?- LHC/RHC (7/17): Patent OM stent, no obstructive CAD.  Mean RA 13, PA 45/25, mean PCWP 16, CI 2.36.  ?- Echo (1/18): EF 30-35% with diffuse hypokinesis, mildly decreased RV systolic function, moderate TR and MR, PASP 53 mmHg, severe LAE.  ?- St Jude CRT-P placed with AV nodal ablation in 3/18.  ?- Echo (9/19): EF 60-65%, moderate LVH, grade 3 diastolic dysfunction, mild MR, mild AI, moderate TR, PASP 43 mmHg.  ?- Echo (10/20): EF 55-60%, moderately decreased RV systolic function with moderate RV enlargement, moderate-severe biatrial enlargement, moderate-severe TR, mild MR.  ?- Echo (4/22): EF 55% with mild LVH, mild RV enlargement with mildly decreased RV systolic function, severe biatrial enlargement, mild MR, moderate TR.  ?3. CAD: DES to OM in 2007.  LHC (7/17) with patent stent and no new obstructive disease.  ?4. Type II diabetes ?5. Hyperlipidemia ?6. TIA ?7. Possible amiodarone lung toxicity: Chest CT (7/17) with bronchiectasis/fibrosis, patchy bilateral densities.  ?8. CKD stage 3 ? ? ?Past Surgical History:  ?Procedure Laterality Date  ? ATRIAL FLUTTER ABLATION N/A 10/09/2012  ? Procedure: ATRIAL FLUTTER ABLATION;  Surgeon: Thompson Grayer, MD;  Location: [facility] CATH LAB;  Service: Cardiovascular;  Laterality: N/A;  ? AV NODE ABLATION N/A 09/27/2016  ? Procedure: AV Node Ablation;  Surgeon: Thompson Grayer, MD;  Location: [place] CV LAB;  Service: Cardiovascular;  Laterality: N/A;  ? BIV PACEMAKER INSERTION CRT-P N/A 09/27/2016  ? Procedure: BiV Pacemaker Insertion CRT-P;  Surgeon: Thompson Grayer, MD;  Location: [place] CV LAB;  Service: Cardiovascular;  Laterality: N/A;  ? CARDIAC CATHETERIZATION N/A 01/06/2016  ? Procedure: Right/Left Heart Cath and Coronary Angiography;  Surgeon: Belva Crome, MD;  Location: [place] CV LAB;  Service: Cardiovascular;   Laterality: N/A;  ? CARDIOVERSION N/A 01/11/2016  ? Procedure: CARDIOVERSION;  Surgeon: Larey Dresser, MD;  Location: [facility] ENDOSCOPY;  Service: Cardiovascular;  Laterality: N/A;  ? cataracts    ? COLONOSCOPY N/A 10/13/2015  ? Procedure: COLONOSCOPY;  Surgeon: Manus Gunning, MD;  Location: [facility] ENDOSCOPY;  Service: Gastroenterology;  Laterality: N/A;  ? CORONARY ANGIOPLASTY    ? Status post PTCA and stenting of the first obtuse marginal-05/13/2006. We placed a 3.0 x 20 mm Taxus stent. It was post dilated using a 3.25 mm noncompliant balloon up to 14 atmospheres  ? IMPLANT POCKET INCISION & DRAINAGE N/A 11/02/2016  ? Procedure: Implant Pocket Incision & Drainage;  Surgeon: Evans Lance, MD;  Location: [place] CV LAB;  Service: Cardiovascular;  Laterality: N/A;  ? TONSILLECTOMY    ? ? ? ?Current Outpatient Medications  ?Medication Sig Dispense Refill  ? ACCU-CHEK FASTCLIX LANCETS MISC USE TO CHECK BLOOD SUGAR TWICE DAILY  5  ? ACCU-CHEK SMARTVIEW test strip     ? atorvastatin (LIPITOR)  20 MG tablet TAKE 1 TABLET EVERY DAY 30 tablet 11  ? bisoprolol (ZEBETA) 5 MG tablet Take 1 tablet (5 mg total) by mouth daily. 90 tablet 3  ? Cholecalciferol (VITAMIN D) 2000 units tablet Take 2,000 Units by mouth daily.    ? ELIQUIS 2.5 MG TABS tablet TAKE 1 TABLET BY MOUTH TWICE A DAY 60 tablet 11  ? empagliflozin (JARDIANCE) 10 MG TABS tablet Take 1 tablet (10 mg total) by mouth daily. 30 tablet 11  ? metFORMIN (GLUCOPHAGE) 500 MG tablet Take 500 mg by mouth daily with supper.     ? nitroGLYCERIN (NITROSTAT) 0.4 MG SL tablet TAKE 1 TABLET BY MOUTH UNDER THE TONGUE EVERY 5 MINUTES FOR 3 DOSES AS NEEDED FOR CHEST PAIN 25 tablet 3  ? potassium chloride SA (KLOR-CON M20) 20 MEQ tablet Take 1 tablet (20 mEq total) by mouth daily. 90 tablet 3  ? spironolactone (ALDACTONE) 25 MG tablet TAKE 1 TABLET (25 MG TOTAL) BY MOUTH DAILY. 90 tablet 3  ? vitamin C (ASCORBIC ACID) 500 MG tablet Take 500 mg by mouth daily.    ? furosemide  (LASIX) 20 MG tablet Take 2 tablets (40 mg total) by mouth daily. 90 tablet 3  ? ?No current facility-administered medications for this encounter.  ? ? ?Allergies:   Amiodarone, Ranexa [ranolazine], Other, Tennis Must

## 2021-11-07 NOTE — Patient Instructions (Signed)
Medication Changes: ? ?Change lasix to 40 mg in the morning and 20 mg in the evening for 3 days, then change to 40 mg daily. ? ?Lab Work: ? ?Labs done today, your results will be available in MyChart, we will contact you for abnormal readings. ? ? ?Testing/Procedures: ? ?Your physician has requested that you have an echocardiogram. Echocardiography is a painless test that uses sound waves to create images of your heart. It provides your doctor with information about the size and shape of your heart and how well your heart?s chambers and valves are working. This procedure takes approximately one hour. There are no restrictions for this procedure. ? ?How to Prepare for Your Myoview Test (stress test): ? ?Nothing to eat or drink, except water, 4 hours prior to arrival time.  NO caffeine/decaffeinated products, or chocolate 12 hours prior to arrival. ?Ladies, please do not wear dresses.  Skirts or pants are approprate, please wear a short sleeve shirt. ?NO perfume, cologne or lotion ?Wear comfortable walking shoes.  NO HEELS! ?Total time is 3 to 4 hours; you may want to bring reading material for the waiting time. ?Please report to [facility] for your test ? ?What to expect after you arrive: ? ?Once you arrive and check in for your appointment an IV will be started in your arm.  Then the Technoligist will inject a small amount of radioactive tracer.  There will be a 1 hour waiting period after this injection.  A series of pictures will be taken of your heart following this waiting period.  You will be prepped for the stress portion of the test.  During the stress portion of your test you will either walk on a treadmill or receive a small, safe amount of radioactive tracer injected in your IV.  After the stress portion, there is a short rest period during which time your heart and blood pressure will be monitored.  After the short rest period the Technologist will begin your second set of pictures.  Your doctor will  inform you of your test results within 7-10 business days. ? ?In preparation for your appointment, medication and supplies will be purchased.  Appointment availability is limited, so if you need to cancel or reschedule please call the office at [phone] 24 hours in advance to avoid a cancellation fee of $100.00 ? ? ?Referrals: ? ?none ? ?Special Instructions // Education: ? ?none ? ?Follow-Up in: 1 week  ? ?At the [facility], you and your health needs are our priority. We have a designated team specialized in the treatment of Heart Failure. This Care Team includes your primary Heart Failure Specialized Cardiologist (physician), Advanced Practice Providers (APPs- Physician Assistants and Nurse Practitioners), and Pharmacist who all work together to provide you with the care you need, when you need it.  ? ?You may see any of the following providers on your designated Care Team at your next follow up: ? ?Dr Glori Bickers ?Dr [name] ?Darrick Grinder, NP ?Lyda Jester, PA ?Jessica Milford,NP ?Marlyce Huge, PA ?Audry Riles, PharmD ? ? ?Please be sure to bring in all your medications bottles to every appointment.  ? ?Need to Contact us: ? ?If you have any questions or concerns before your next appointment please send [place] a message through [place] or call our office at [phone].   ? ?TO LEAVE A MESSAGE FOR THE NURSE SELECT OPTION 2, PLEASE LEAVE A MESSAGE INCLUDING: ?YOUR NAME ?DATE OF BIRTH ?CALL BACK NUMBER ?REASON FOR CALL**this  is important as we prioritize the call backs ? ?YOU WILL RECEIVE A CALL BACK THE SAME DAY AS LONG AS YOU CALL BEFORE 4:00 PM ? ? ?

## 2021-11-07 NOTE — Progress Notes (Signed)
ReDS Vest / Clip - 11/07/21 1200   ? ?  ? ReDS Vest / Clip  ? Station Marker A   ? Ruler Value 28   ? ReDS Value Range Low volume   ? ReDS Actual Value 31   ? ?  ?  ? ?  ? ? ?

## 2021-11-13 NOTE — Progress Notes (Signed)
Remote pacemaker transmission.   

## 2021-11-13 NOTE — Progress Notes (Addendum)
?  ? ?Advanced Heart Failure Clinic Note ? ? ?ID:  Shannon [name], DOB 04/06/1932, MRN [id number]  ?Provider location: [place] [facility] ?Type of Visit: Established patient  ? ?PCP:  Tisovec, Fransico Him, MD  ?Cardiologist:  Mertie Moores, MD ?HF Cardiologist: Dr. [name] ? ?Chief Complaint: HF follow up ?  ?HPI: ?Shannon [name] is a 88 y.o. female who has a history of paroxysmal atrial fibrillation, chronic systolic CHF, and CAD. She apparently had been in persistent atrial fibrillation since around 8/16.  It appears that she was tachycardic most of the time prior to her recent admission in 7/17.  She had been on amiodarone prior to admission but it was not keeping her in NSR.  She was admitted with acute systolic CHF.  EF had dropped to 20-25% in 7/17 from 50-55% in 10/16.  She was in atrial fibrillation with RVR.  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  Amiodarone was stopped due to suspicion for amiodarone-induced lung toxicity (she was seen by pulmonary).  She was eventually cardioverted to NSR.  Ranolazine was started to try to keep her in NSR.  HR was low in the hospital so she has not been on a beta blocker.  She was extensively diuresed and discharged to SNF.  After discharge, she developed persistent nausea.  Eventually, it was found that the nausea was likely from ranolazine, so this medication was stopped and the nausea resolved.   ?  ?Patient continued to have difficult-to-control atrial fibrillation.  GFR was too low for Tikosyn.  She therefore had AV nodal ablation with St Jude CRT-P device placed in 3/18.  She developed a pocket hematoma and had pocket revision with sub-pectoral placement in 5/18.  ?  ?Echo in 9/19 showed EF up to 60-65%, moderate MR, moderate TR, PASP 43 mmHg. Echo in 10/20 showed EF 55-60%, moderately decreased RV systolic function with moderate RV enlargement, moderate-severe biatrial enlargement, moderate-severe  TR, mild MR.  ? ?Echo in 4/22 showed EF 55% with mild LVH, mild RV enlargement with mildly decreased RV systolic function, severe biatrial enlargement, mild MR, moderate TR.  ?  ?Follow up 11/07/21, volume overloaded on exam. Lasix increased to 40/20 x 3 days then 40 daily. Having several episodes of chest pain so echo and Lexiscan Cardiolite arranged. ? ?Today she returns for HF follow up with her son. Overall feeling better. Breathing has improved and legs are less swollen. She feels limited physically by leg pain and planta fasciitis. No further chest pain. Denies palpitations, abnormal bleeding, dizziness, edema, or PND/Orthopnea. Appetite ok. No fever or chills. Weight at home 118 pounds. Taking all medications. Has an itchy rash on right shoulder since starting Jardiance in January. Has been using hydrocortisone cream with some relief. ? ?REDS clip: 31%-->29% today. ? ?ECG (personally reviewed): none ordered today. ?  ?Labs (7/17): Digoxin 1.6 => 0.3, K 4.6, creatinine 0.87 => 0.76, HCT 34, BNP 265 ?Labs (9/17): K 5, creatinine 1.36 ?Labs (11/17): K 4.9, creatinine 1.29, hgb 9.2, digoxin 0.5 ?Labs (12/17): hgb 10.4, digoxin 0.4 ?Labs (1/18): K 4, creatinine 1.62 => 1.49, hgb 9.5, LDL 42, HDL 63 ?Labs (3/18): K 4.1, creatinine 1.44, digoxin 0.4 ?Labs (6/18): K 3.9, creatinine 1.7, hgb 11.5, LDL 56, HDL 50 ?Labs (6/19): K 4.4, creatinine 1.7, hgb 12.8, LDL 54 ?Labs (10/19): K 4.2, creatinine 1.46 ?Labs (6/21): K 4.6, creatinine 1.5, LDL 43, HDL 51 ?Labs (3/22): BNP 635, LFTs normal, K 3.8, creatinine 1.81 ?  Labs (5/23): K 4.3, creatinine 1.63 ?   ?PMH: ?1. Atrial fibrillation: Paroxysmal.  Had been persistent since 8/16, was cardioverted to NSR during 7/17 admission.  Possible atypical atrial flutter on 12/17 ECG.  By 3/18, atrial fibrillation again.  ?- Nausea with ranolazine used for atrial fibrillation suppression.  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  ?- AV nodal ablation 3/18 with CRT-P.  ?2.  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  She had been in atrial fibrillation with elevated rate possibly for months prior to 7/17 admission.  ?- Echo (10/16): EF 50-55%.  ?- Echo (7/17): EF 20-25%, mildly decreased RV systolic function, severe TR, PA systolic pressure 64 mmHg.  ?- LHC/RHC (7/17): Patent OM stent, no obstructive CAD.  Mean RA 13, PA 45/25, mean PCWP 16, CI 2.36.  ?- Echo (1/18): EF 30-35% with diffuse hypokinesis, mildly decreased RV systolic function, moderate TR and MR, PASP 53 mmHg, severe LAE.  ?- St Jude CRT-P placed with AV nodal ablation in 3/18.  ?- Echo (9/19): EF 60-65%, moderate LVH, grade 3 diastolic dysfunction, mild MR, mild AI, moderate TR, PASP 43 mmHg.  ?- Echo (10/20): EF 55-60%, moderately decreased RV systolic function with moderate RV enlargement, moderate-severe biatrial enlargement, moderate-severe TR, mild MR.  ?- Echo (4/22): EF 55% with mild LVH, mild RV enlargement with mildly decreased RV systolic function, severe biatrial enlargement, mild MR, moderate TR.  ?3. CAD: DES to OM in 2007.  LHC (7/17) with patent stent and no new obstructive disease.  ?4. Type II diabetes ?5. Hyperlipidemia ?6. TIA ?7. Possible amiodarone lung toxicity: Chest CT (7/17) with bronchiectasis/fibrosis, patchy bilateral densities.  ?8. CKD stage 3 ? ? ?Past Surgical History:  ?Procedure Laterality Date  ? ATRIAL FLUTTER ABLATION N/A 10/09/2012  ? Procedure: ATRIAL FLUTTER ABLATION;  Surgeon: Thompson Grayer, MD;  Location: [facility] CATH LAB;  Service: Cardiovascular;  Laterality: N/A;  ? AV NODE ABLATION N/A 09/27/2016  ? Procedure: AV Node Ablation;  Surgeon: Thompson Grayer, MD;  Location: [place] CV LAB;  Service: Cardiovascular;  Laterality: N/A;  ? BIV PACEMAKER INSERTION CRT-P N/A 09/27/2016  ? Procedure: BiV Pacemaker Insertion CRT-P;  Surgeon: Thompson Grayer, MD;  Location: [place] CV LAB;  Service: Cardiovascular;  Laterality: N/A;  ? CARDIAC CATHETERIZATION  N/A 01/06/2016  ? Procedure: Right/Left Heart Cath and Coronary Angiography;  Surgeon: Belva Crome, MD;  Location: [place] CV LAB;  Service: Cardiovascular;  Laterality: N/A;  ? CARDIOVERSION N/A 01/11/2016  ? Procedure: CARDIOVERSION;  Surgeon: Larey Dresser, MD;  Location: [facility] ENDOSCOPY;  Service: Cardiovascular;  Laterality: N/A;  ? cataracts    ? COLONOSCOPY N/A 10/13/2015  ? Procedure: COLONOSCOPY;  Surgeon: Manus Gunning, MD;  Location: [facility] ENDOSCOPY;  Service: Gastroenterology;  Laterality: N/A;  ? CORONARY ANGIOPLASTY    ? Status post PTCA and stenting of the first obtuse marginal-05/13/2006. We placed a 3.0 x 20 mm Taxus stent. It was post dilated using a 3.25 mm noncompliant balloon up to 14 atmospheres  ? IMPLANT POCKET INCISION & DRAINAGE N/A 11/02/2016  ? Procedure: Implant Pocket Incision & Drainage;  Surgeon: Evans Lance, MD;  Location: [place] CV LAB;  Service: Cardiovascular;  Laterality: N/A;  ? TONSILLECTOMY    ? ? ? ?Current Outpatient Medications  ?Medication Sig Dispense Refill  ? ACCU-CHEK FASTCLIX LANCETS MISC USE TO CHECK BLOOD SUGAR TWICE DAILY  5  ? ACCU-CHEK SMARTVIEW test strip     ? atorvastatin (LIPITOR) 20 MG tablet TAKE  1 TABLET EVERY DAY 30 tablet 11  ? bisoprolol (ZEBETA) 5 MG tablet Take 1 tablet (5 mg total) by mouth daily. 90 tablet 3  ? Cholecalciferol (VITAMIN D) 2000 units tablet Take 2,000 Units by mouth daily.    ? ELIQUIS 2.5 MG TABS tablet TAKE 1 TABLET BY MOUTH TWICE A DAY 60 tablet 11  ? empagliflozin (JARDIANCE) 10 MG TABS tablet Take 1 tablet (10 mg total) by mouth daily. 30 tablet 11  ? furosemide (LASIX) 20 MG tablet Take 2 tablets (40 mg total) by mouth daily. 90 tablet 3  ? metFORMIN (GLUCOPHAGE) 500 MG tablet Take 500 mg by mouth daily with supper.     ? nitroGLYCERIN (NITROSTAT) 0.4 MG SL tablet TAKE 1 TABLET BY MOUTH UNDER THE TONGUE EVERY 5 MINUTES FOR 3 DOSES AS NEEDED FOR CHEST PAIN 25 tablet 3  ? potassium chloride SA (KLOR-CON M20) 20 MEQ  tablet Take 1 tablet (20 mEq total) by mouth daily. 90 tablet 3  ? spironolactone (ALDACTONE) 25 MG tablet TAKE 1 TABLET (25 MG TOTAL) BY MOUTH DAILY. 90 tablet 3  ? vitamin C (ASCORBIC ACID) 500 MG table

## 2021-11-13 NOTE — Addendum Note (Signed)
Addended by: Douglass Rivers D on: 11/13/2021 11:14 AM ? ? Modules accepted: Level of Service ? ?

## 2021-11-14 ENCOUNTER — Telehealth (HOSPITAL_COMMUNITY): Payer: Self-pay | Admitting: Pharmacy Technician

## 2021-11-14 ENCOUNTER — Ambulatory Visit (HOSPITAL_COMMUNITY)
Admission: RE | Admit: 2021-11-14 | Discharge: 2021-11-14 | Disposition: A | Discharge status: Home / Self Care | Payer: PPO | Source: Ambulatory Visit | Attending: Family Medicine | Admitting: Family Medicine

## 2021-11-14 ENCOUNTER — Encounter (HOSPITAL_COMMUNITY): Payer: Self-pay

## 2021-11-14 ENCOUNTER — Other Ambulatory Visit (HOSPITAL_COMMUNITY): Payer: Self-pay

## 2021-11-14 ENCOUNTER — Telehealth (HOSPITAL_COMMUNITY): Payer: Self-pay | Admitting: Surgery

## 2021-11-14 VITALS — BP 114/70 | HR 55 | Wt 116.8 lb

## 2021-11-14 DIAGNOSIS — I4821 Permanent atrial fibrillation: Secondary | ICD-10-CM

## 2021-11-14 DIAGNOSIS — Z7985 Long-term (current) use of injectable non-insulin antidiabetic drugs: Secondary | ICD-10-CM | POA: Diagnosis not present

## 2021-11-14 DIAGNOSIS — B360 Pityriasis versicolor: Secondary | ICD-10-CM | POA: Diagnosis not present

## 2021-11-14 DIAGNOSIS — I5022 Chronic systolic (congestive) heart failure: Secondary | ICD-10-CM

## 2021-11-14 DIAGNOSIS — I428 Other cardiomyopathies: Secondary | ICD-10-CM | POA: Diagnosis not present

## 2021-11-14 DIAGNOSIS — I4892 Unspecified atrial flutter: Secondary | ICD-10-CM | POA: Diagnosis not present

## 2021-11-14 DIAGNOSIS — N183 Chronic kidney disease, stage 3 unspecified: Secondary | ICD-10-CM | POA: Diagnosis not present

## 2021-11-14 DIAGNOSIS — Z7901 Long term (current) use of anticoagulants: Secondary | ICD-10-CM | POA: Diagnosis not present

## 2021-11-14 DIAGNOSIS — Z79899 Other long term (current) drug therapy: Secondary | ICD-10-CM | POA: Insufficient documentation

## 2021-11-14 DIAGNOSIS — I4819 Other persistent atrial fibrillation: Secondary | ICD-10-CM | POA: Insufficient documentation

## 2021-11-14 DIAGNOSIS — Z7984 Long term (current) use of oral hypoglycemic drugs: Secondary | ICD-10-CM | POA: Insufficient documentation

## 2021-11-14 DIAGNOSIS — I5042 Chronic combined systolic (congestive) and diastolic (congestive) heart failure: Secondary | ICD-10-CM | POA: Insufficient documentation

## 2021-11-14 DIAGNOSIS — I2510 Atherosclerotic heart disease of native coronary artery without angina pectoris: Secondary | ICD-10-CM | POA: Diagnosis not present

## 2021-11-14 LAB — BRAIN NATRIURETIC PEPTIDE: B Natriuretic Peptide: 543.7 pg/mL — ABNORMAL HIGH (ref 0.0–100.0)

## 2021-11-14 LAB — BASIC METABOLIC PANEL
Anion gap: 12 (ref 5–15)
BUN: 40 mg/dL — ABNORMAL HIGH (ref 8–23)
CO2: 23 mmol/L (ref 22–32)
Calcium: 9.7 mg/dL (ref 8.9–10.3)
Chloride: 102 mmol/L (ref 98–111)
Creatinine, Ser: 1.99 mg/dL — ABNORMAL HIGH (ref 0.44–1.00)
GFR, Estimated: 24 mL/min — ABNORMAL LOW (ref >60)
Glucose, Bld: 122 mg/dL — ABNORMAL HIGH (ref 70–99)
Potassium: 4.2 mmol/L (ref 3.5–5.1)
Sodium: 137 mmol/L (ref 135–145)

## 2021-11-14 NOTE — Telephone Encounter (Signed)
Advanced Heart Failure Patient Advocate Encounter  The patient was approved for a [employer] grant that will help cover the cost of Jardiance. Total amount awarded, $10,000. Eligibility, 10/15/21 - 10/15/22.  ID [id number]  BIN [id number]  PCN [id number]  Group [id number]  Called and provided the billing information to the patient's pharmacy. Had to leave all the billing information via voicemail.  Charlann Boxer, CPhT

## 2021-11-14 NOTE — Patient Instructions (Addendum)
Thank you for coming in today ? ?Labs were done today, if any labs are abnormal the clinic will call you ?No news is good news ? ?Your physician recommends that you schedule a follow-up appointment in:  ?3 months with Dr. [name] ? ?For Tinea Try Head and shoulders or Selsum Blue and apply to affected area daily during shower for 1-2 weeks  ? ?At the [facility], you and your health needs are our priority. As part of our continuing mission to provide you with exceptional heart care, we have created designated Provider Care Teams. These Care Teams include your primary Cardiologist (physician) and Advanced Practice Providers (APPs- Physician Assistants and Nurse Practitioners) who all work together to provide you with the care you need, when you need it.  ? ?You may see any of the following providers on your designated Care Team at your next follow up: ?Dr Glori Bickers ?Dr [name] ?Darrick Grinder, NP ?Lyda Jester, PA ?Jessica Milford,NP ?Marlyce Huge, PA ?Audry Riles, PharmD ? ? ?Please be sure to bring in all your medications bottles to every appointment.  ? ?If you have any questions or concerns before your next appointment please send [place] a message through [place] or call our office at [phone].   ? ?TO LEAVE A MESSAGE FOR THE NURSE SELECT OPTION 2, PLEASE LEAVE A MESSAGE INCLUDING: ?YOUR NAME ?DATE OF BIRTH ?CALL BACK NUMBER ?REASON FOR CALL**this is important as we prioritize the call backs ? ?YOU WILL RECEIVE A CALL BACK THE SAME DAY AS LONG AS YOU CALL BEFORE 4:00 PM ? ?

## 2021-11-14 NOTE — Telephone Encounter (Signed)
Patient called back and results reviewed.  She will return on May 26th for repeat labwork. ?

## 2021-11-14 NOTE — Telephone Encounter (Signed)
I attempted to reach patient to review results and recommendations per provider.  I left a message for a return call. 

## 2021-11-14 NOTE — Telephone Encounter (Signed)
-----   Message from Rafael Bihari, [place] sent at 11/14/2021  1:08 PM EDT ----- ?Kidney function mildly elevated after increase in diuretics. ? ?Please repeat BMET in 10-14 days to follow ?

## 2021-11-14 NOTE — Progress Notes (Signed)
ReDS Vest / Clip - 11/14/21 1100   ? ?  ? ReDS Vest / Clip  ? Station Marker A   ? Ruler Value 24   ? ReDS Value Range Low volume   ? ReDS Actual Value 29   ? ?  ?  ? ?  ? ? ?

## 2021-11-14 NOTE — Progress Notes (Signed)
Eliquis samples a month supply given to pt  ?Lot # [id number] ?Exp 03/2022 ? ?Month supply of [place] given to pt  ?Lot# [id number] ?Exp 05/01/2024 ? ? ?

## 2021-11-14 NOTE — Telephone Encounter (Signed)
-----   Message from Rafael Bihari, [place] sent at 11/14/2021  1:08 PM EDT ----- ?Kidney function mildly elevated after increase in diuretics. ? ?Please repeat BMET in 10-14 days to follow ?

## 2021-11-22 ENCOUNTER — Ambulatory Visit (HOSPITAL_COMMUNITY)
Admission: RE | Admit: 2021-11-22 | Discharge: 2021-11-22 | Disposition: A | Discharge status: Home / Self Care | Payer: PPO | Source: Ambulatory Visit | Attending: Cardiology | Admitting: Cardiology

## 2021-11-22 DIAGNOSIS — I5020 Unspecified systolic (congestive) heart failure: Secondary | ICD-10-CM | POA: Insufficient documentation

## 2021-11-22 DIAGNOSIS — I2510 Atherosclerotic heart disease of native coronary artery without angina pectoris: Secondary | ICD-10-CM | POA: Insufficient documentation

## 2021-11-22 DIAGNOSIS — I5022 Chronic systolic (congestive) heart failure: Secondary | ICD-10-CM

## 2021-11-22 DIAGNOSIS — I083 Combined rheumatic disorders of mitral, aortic and tricuspid valves: Secondary | ICD-10-CM | POA: Diagnosis not present

## 2021-11-22 DIAGNOSIS — I110 Hypertensive heart disease with heart failure: Secondary | ICD-10-CM | POA: Insufficient documentation

## 2021-11-22 DIAGNOSIS — G4730 Sleep apnea, unspecified: Secondary | ICD-10-CM | POA: Insufficient documentation

## 2021-11-22 DIAGNOSIS — E119 Type 2 diabetes mellitus without complications: Secondary | ICD-10-CM | POA: Diagnosis not present

## 2021-11-22 DIAGNOSIS — E785 Hyperlipidemia, unspecified: Secondary | ICD-10-CM | POA: Insufficient documentation

## 2021-11-22 DIAGNOSIS — I5042 Chronic combined systolic (congestive) and diastolic (congestive) heart failure: Secondary | ICD-10-CM | POA: Diagnosis not present

## 2021-11-22 LAB — ECHOCARDIOGRAM COMPLETE
Area-P 1/2: 3.11 cm2
MV M vel: 4.12 m/s
MV Peak grad: 67.9 mmHg
Radius: 0.5 cm
S' Lateral: 3.6 cm

## 2021-11-22 LAB — BASIC METABOLIC PANEL
Anion gap: 8 (ref 5–15)
BUN: 28 mg/dL — ABNORMAL HIGH (ref 8–23)
CO2: 25 mmol/L (ref 22–32)
Calcium: 9.5 mg/dL (ref 8.9–10.3)
Chloride: 104 mmol/L (ref 98–111)
Creatinine, Ser: 1.67 mg/dL — ABNORMAL HIGH (ref 0.44–1.00)
GFR, Estimated: 29 mL/min — ABNORMAL LOW (ref >60)
Glucose, Bld: 123 mg/dL — ABNORMAL HIGH (ref 70–99)
Potassium: 4.8 mmol/L (ref 3.5–5.1)
Sodium: 137 mmol/L (ref 135–145)

## 2021-11-22 NOTE — Telephone Encounter (Signed)
Advanced Heart Failure Patient Advocate Encounter  Patient's pharmacy called back. They do not have that grant plan built into their pharmacy system. I suggested that they put in a ticket with their pharmacy help desk to get the plan built into their system. I cannot fix this problem at their store.   Advised the pharmacy to let the patient know when she picks up what the issue is. The patient has not called me back. The only other option there is would be to transfer the RX out to another pharmacy.  Charlann Boxer, CPhT

## 2021-11-22 NOTE — Progress Notes (Signed)
  Echocardiogram 2D Echocardiogram has been performed.  Darlina Sicilian M 11/22/2021, 12:01 PM

## 2021-11-24 ENCOUNTER — Other Ambulatory Visit (HOSPITAL_COMMUNITY): Payer: PPO

## 2021-11-24 ENCOUNTER — Telehealth (HOSPITAL_COMMUNITY): Payer: Self-pay | Admitting: Licensed Clinical Social Worker

## 2021-11-24 NOTE — Telephone Encounter (Signed)
CSW consulted to help pt apply for LIS.  Application complete- should hear back within 1 month regarding approval  Jorge Ny, [facility] Desk#: [phone] Cell#: [phone]

## 2021-11-24 NOTE — Pre-Procedure Instructions (Signed)
Attempted to call patient regarding procedure instructions.  Left voice mail on the following  Instructed patient on the following items: Arrival time 1100, time change Nothing to eat or drink after midnight No meds AM of procedure Responsible person to drive you home and stay with you for 24 hrs  Have you missed any doses of anti-coagulant Eliquis- last dose should be Saturday 11/25/21  Also called her son to go over the above instructions.

## 2021-11-28 ENCOUNTER — Encounter (HOSPITAL_COMMUNITY): Payer: Self-pay | Admitting: Cardiology

## 2021-11-28 ENCOUNTER — Other Ambulatory Visit: Payer: Self-pay

## 2021-11-28 ENCOUNTER — Encounter (HOSPITAL_COMMUNITY)
Admission: RE | Disposition: A | Discharge status: Home / Self Care | Payer: Self-pay | Source: Home / Self Care | Attending: Internal Medicine

## 2021-11-28 ENCOUNTER — Ambulatory Visit (HOSPITAL_COMMUNITY)
Admission: RE | Admit: 2021-11-28 | Discharge: 2021-11-28 | Disposition: A | Discharge status: Home / Self Care | Payer: PPO | Attending: Internal Medicine | Admitting: Internal Medicine

## 2021-11-28 DIAGNOSIS — I442 Atrioventricular block, complete: Secondary | ICD-10-CM | POA: Diagnosis not present

## 2021-11-28 DIAGNOSIS — I2510 Atherosclerotic heart disease of native coronary artery without angina pectoris: Secondary | ICD-10-CM | POA: Diagnosis not present

## 2021-11-28 DIAGNOSIS — Z45010 Encounter for checking and testing of cardiac pacemaker pulse generator [battery]: Secondary | ICD-10-CM | POA: Diagnosis not present

## 2021-11-28 DIAGNOSIS — Z950 Presence of cardiac pacemaker: Secondary | ICD-10-CM

## 2021-11-28 DIAGNOSIS — E785 Hyperlipidemia, unspecified: Secondary | ICD-10-CM | POA: Diagnosis not present

## 2021-11-28 DIAGNOSIS — Z79899 Other long term (current) drug therapy: Secondary | ICD-10-CM | POA: Insufficient documentation

## 2021-11-28 DIAGNOSIS — I482 Chronic atrial fibrillation, unspecified: Secondary | ICD-10-CM | POA: Diagnosis not present

## 2021-11-28 DIAGNOSIS — Z87891 Personal history of nicotine dependence: Secondary | ICD-10-CM | POA: Diagnosis not present

## 2021-11-28 DIAGNOSIS — I5022 Chronic systolic (congestive) heart failure: Secondary | ICD-10-CM | POA: Insufficient documentation

## 2021-11-28 HISTORY — PX: PPM GENERATOR CHANGEOUT: EP1233

## 2021-11-28 LAB — GLUCOSE, CAPILLARY: Glucose-Capillary: 118 mg/dL — ABNORMAL HIGH (ref 70–99)

## 2021-11-28 SURGERY — PPM GENERATOR CHANGEOUT

## 2021-11-28 MED ORDER — VANCOMYCIN HCL IN DEXTROSE 1-5 GM/200ML-% IV SOLN
INTRAVENOUS | Status: AC
  Filled 2021-11-28: qty 200

## 2021-11-28 MED ORDER — HYDROCODONE-ACETAMINOPHEN 5-325 MG PO TABS
1.0000 | ORAL_TABLET | Freq: Once | ORAL | Status: AC
  Administered 2021-11-28: 1 via ORAL
  Filled 2021-11-28: qty 1

## 2021-11-28 MED ORDER — SODIUM CHLORIDE 0.9 % IV SOLN: INTRAVENOUS | Status: DC

## 2021-11-28 MED ORDER — SODIUM CHLORIDE 0.9 % IV SOLN
80.0000 mg | INTRAVENOUS | Status: AC
  Administered 2021-11-28: 80 mg

## 2021-11-28 MED ORDER — APIXABAN 2.5 MG PO TABS: 2.5000 mg | ORAL_TABLET | Freq: Two times a day (BID) | ORAL | 11 refills | Status: DC

## 2021-11-28 MED ORDER — CHLORHEXIDINE GLUCONATE 4 % EX LIQD
4.0000 "application " | Freq: Once | CUTANEOUS | Status: DC
  Filled 2021-11-28: qty 60

## 2021-11-28 MED ORDER — ONDANSETRON HCL 4 MG/2ML IJ SOLN
4.0000 mg | Freq: Four times a day (QID) | INTRAMUSCULAR | Status: DC | PRN
Start: 2021-11-28 — End: 2021-11-28

## 2021-11-28 MED ORDER — VANCOMYCIN HCL IN DEXTROSE 1-5 GM/200ML-% IV SOLN
1000.0000 mg | INTRAVENOUS | Status: AC
  Administered 2021-11-28: 1000 mg via INTRAVENOUS

## 2021-11-28 MED ORDER — ACETAMINOPHEN 325 MG PO TABS: 325.0000 mg | ORAL_TABLET | ORAL | Status: DC | PRN

## 2021-11-28 MED ORDER — SODIUM CHLORIDE 0.9 % IV SOLN
INTRAVENOUS | Status: AC
  Filled 2021-11-28: qty 2

## 2021-11-28 MED ORDER — LIDOCAINE HCL 1 % IJ SOLN
INTRAMUSCULAR | Status: AC
  Filled 2021-11-28: qty 60

## 2021-11-28 MED ORDER — LIDOCAINE HCL (PF) 1 % IJ SOLN
INTRAMUSCULAR | Status: DC | PRN
  Administered 2021-11-28: 60 mL

## 2021-11-28 SURGICAL SUPPLY — 5 items
CABLE SURGICAL S-101-97-12 (CABLE) ×2 IMPLANT
PACEMAKER ALLR CRT-P RF PM3222 (Pacemaker) IMPLANT
PAD DEFIB RADIO PHYSIO CONN (PAD) ×2 IMPLANT
PPM ALLURE CRT-P RF PM3222 (Pacemaker) ×2 IMPLANT
TRAY PACEMAKER INSERTION (PACKS) ×2 IMPLANT

## 2021-11-28 NOTE — Discharge Instructions (Signed)

## 2021-11-28 NOTE — H&P (Signed)
HPI Shannon [name] returns today after a long absence from our EP clinic. She is a pleasant 88 yo woman with a h/o CHB and chronic systolic heart failure s/p PPM insertion. I saw her last 5 years ago when she had to have a pacemaker pocket hematoma evac and relocation of her pocket to a sub pec location. She has now reached ERI>       Allergies  Allergen Reactions   Amiodarone Anaphylaxis   Ranexa [Ranolazine] Nausea And Vomiting   Other Other (See Comments)      Vitamin E - causes heart rate to increase   Demerol Other (See Comments)      Burning sensation all over body   Erythromycin Nausea And Vomiting   Penicillins Other (See Comments)      unknown   Percocet [Oxycodone-Acetaminophen] Swelling              Current Outpatient Medications  Medication Sig Dispense Refill   ACCU-CHEK FASTCLIX LANCETS MISC USE TO CHECK BLOOD SUGAR TWICE DAILY   5   ACCU-CHEK SMARTVIEW test strip         atorvastatin (LIPITOR) 20 MG tablet TAKE 1 TABLET EVERY DAY 30 tablet 11   bisoprolol (ZEBETA) 5 MG tablet Take 1 tablet (5 mg total) by mouth daily. 90 tablet 3   Cholecalciferol (VITAMIN D) 2000 units tablet Take 2,000 Units by mouth daily.       ELIQUIS 2.5 MG TABS tablet TAKE 1 TABLET BY MOUTH TWICE A DAY 60 tablet 11   empagliflozin (JARDIANCE) 10 MG TABS tablet Take 1 tablet (10 mg total) by mouth daily. 30 tablet 11   furosemide (LASIX) 20 MG tablet TAKE 20 MG (1 TAB) ONCE DAILY ALTERNATING WITH 40 MG (2 TABS) 270 tablet 1   metFORMIN (GLUCOPHAGE) 500 MG tablet Take 500 mg by mouth daily with supper.        nitroGLYCERIN (NITROSTAT) 0.4 MG SL tablet TAKE 1 TABLET BY MOUTH UNDER THE TONGUE EVERY 5 MINUTES FOR 3 DOSES AS NEEDED FOR CHEST PAIN 25 tablet 3   spironolactone (ALDACTONE) 25 MG tablet TAKE 1 TABLET (25 MG TOTAL) BY MOUTH DAILY. 90 tablet 3   vitamin C (ASCORBIC ACID) 500 MG tablet Take 500 mg by mouth daily.        No current facility-administered medications for this visit.             Past Medical History:  Diagnosis Date   Arthritis     Baker's cyst of knee     CAD (coronary artery disease)      a. 05/2006 Cath/PCI: LAD 26m D1 40 ost, LCX nl, OM1 95 (3.0x20 Taxus DES), RCA nl, EF 40% w/ lat AK;  04/2007 Ex MV: minimal lat ischemia in area of prior infarct->low risk-> med Rx.   Chronic atrial fibrillation (HCC)      a. on Coumadin   Chronic systolic (congestive) heart failure ([facility]      a. 04/2015: EF 50-55% b. echo 12/2015: EF 20-25% w/ diffuse HK, biatrial enlargement, mild AI and MR, severe TR     Diabetes mellitus     Hyperlipidemia     MVP (mitral valve prolapse)      a. 10/2003 Echo: nl LV fxn, mild MR/TR/AS   Myocardial infarction ([facility]     Sleep apnea     Transient ischemic attack        ROS:    All systems reviewed and  negative except as noted in the HPI.          Past Surgical History:  Procedure Laterality Date   ATRIAL FLUTTER ABLATION N/A 10/09/2012    Procedure: ATRIAL FLUTTER ABLATION;  Surgeon: Thompson Grayer, MD;  Location: [facility] CATH LAB;  Service: Cardiovascular;  Laterality: N/A;   AV NODE ABLATION N/A 09/27/2016    Procedure: AV Node Ablation;  Surgeon: Thompson Grayer, MD;  Location: [place] CV LAB;  Service: Cardiovascular;  Laterality: N/A;   BIV PACEMAKER INSERTION CRT-P N/A 09/27/2016    Procedure: BiV Pacemaker Insertion CRT-P;  Surgeon: Thompson Grayer, MD;  Location: [place] CV LAB;  Service: Cardiovascular;  Laterality: N/A;   CARDIAC CATHETERIZATION N/A 01/06/2016    Procedure: Right/Left Heart Cath and Coronary Angiography;  Surgeon: Belva Crome, MD;  Location: [place] CV LAB;  Service: Cardiovascular;  Laterality: N/A;   CARDIOVERSION N/A 01/11/2016    Procedure: CARDIOVERSION;  Surgeon: Larey Dresser, MD;  Location: [place];  Service: Cardiovascular;  Laterality: N/A;   cataracts       COLONOSCOPY N/A 10/13/2015    Procedure: COLONOSCOPY;  Surgeon: Manus Gunning, MD;  Location: [place];   Service: Gastroenterology;  Laterality: N/A;   CORONARY ANGIOPLASTY        Status post PTCA and stenting of the first obtuse marginal-05/13/2006. We placed a 3.0 x 20 mm Taxus stent. It was post dilated using a 3.25 mm noncompliant balloon up to 14 atmospheres   IMPLANT POCKET INCISION & DRAINAGE N/A 11/02/2016    Procedure: Implant Pocket Incision & Drainage;  Surgeon: Evans Lance, MD;  Location: [place] CV LAB;  Service: Cardiovascular;  Laterality: N/A;   TONSILLECTOMY                 Family History  Problem Relation Age of Onset   Heart attack Mother     Stroke Mother     Heart attack Father     Heart attack Brother     Coronary artery disease Sister          CABG   Alzheimer's disease Sister          X3   Stroke Sister     Heart attack Sister          Social History         Socioeconomic History   Marital status: Married      Spouse name: Not on file   Number of children: Not on file   Years of education: Not on file   Highest education level: Not on file  Occupational History   Not on file  Tobacco Use   Smoking status: Former      Packs/day: 1.00      Types: Cigarettes      Quit date: 07/03/1963      Years since quitting: 58.3   Smokeless tobacco: Never  Substance and Sexual Activity   Alcohol use: No   Drug use: No   Sexual activity: Never  Other Topics Concern   Not on file  Social History Narrative    Lives in [place] with husband.  Does not routinely exercise but is able to keep house and go food shopping.    Social Determinants of Health    Financial Resource Strain: Not on file  Food Insecurity: Not on file  Transportation Needs: Not on file  Physical Activity: Not on file  Stress: Not on file  Social Connections: Not on file  Intimate Partner Violence: Not on file        BP 102/62   Pulse (!) 55   Ht '5\' 2"'$  (1.575 m)   Wt 121 lb 12.8 oz (55.2 kg)   SpO2 99%   BMI 22.28 kg/m    Physical Exam:   Well appearing NAD HEENT:  Unremarkable Neck:  No JVD, no thyromegally Lymphatics:  No adenopathy Back:  No CVA tenderness Lungs:  Clear HEART:  Regular rate rhythm, no murmurs, no rubs, no clicks Abd:  soft, positive bowel sounds, no organomegally, no rebound, no guarding Ext:  2 plus pulses, no edema, no cyanosis, no clubbing Skin:  No rashes no nodules Neuro:  CN II through XII intact, motor grossly intact   EKG - atrial fib with ventricular pacing   DEVICE  Normal device function.  See PaceArt for details. She is at [facility]   Assess/Plan:  Atrial fib - her VR is well controlled and her atrial fib is permanent. She has no escape.  CHB - she has no escape. We will follow CAD - she is s/p PCI but has no angina Dyslipidemia - she will continue atorvastatin.    Carleene Overlie Ziare Cryder,MD

## 2021-11-29 ENCOUNTER — Encounter (HOSPITAL_COMMUNITY): Payer: Self-pay | Admitting: Internal Medicine

## 2021-11-29 ENCOUNTER — Telehealth: Payer: Self-pay | Admitting: Internal Medicine

## 2021-11-29 NOTE — Telephone Encounter (Signed)
Son of the patient called. The patient woke up at 4:00 am in excruciating pain following her PPM replacement . Her Son wanted to know what she can take to help with the pain. The patient took tylenol and he said it did not touch the pain. Please advise

## 2021-11-29 NOTE — Telephone Encounter (Signed)
Returned call to son.  Advised Pt to use tylenol and cool compresses for discomfort.

## 2021-12-06 ENCOUNTER — Telehealth (HOSPITAL_COMMUNITY): Payer: Self-pay | Admitting: Cardiology

## 2021-12-06 DIAGNOSIS — Z950 Presence of cardiac pacemaker: Secondary | ICD-10-CM | POA: Diagnosis not present

## 2021-12-06 DIAGNOSIS — Z955 Presence of coronary angioplasty implant and graft: Secondary | ICD-10-CM | POA: Diagnosis not present

## 2021-12-06 DIAGNOSIS — I25118 Atherosclerotic heart disease of native coronary artery with other forms of angina pectoris: Secondary | ICD-10-CM | POA: Diagnosis not present

## 2021-12-06 DIAGNOSIS — Z515 Encounter for palliative care: Secondary | ICD-10-CM | POA: Diagnosis not present

## 2021-12-06 DIAGNOSIS — N1832 Chronic kidney disease, stage 3b: Secondary | ICD-10-CM | POA: Diagnosis not present

## 2021-12-06 DIAGNOSIS — Z7901 Long term (current) use of anticoagulants: Secondary | ICD-10-CM | POA: Diagnosis not present

## 2021-12-06 DIAGNOSIS — I480 Paroxysmal atrial fibrillation: Secondary | ICD-10-CM | POA: Diagnosis not present

## 2021-12-06 DIAGNOSIS — E1122 Type 2 diabetes mellitus with diabetic chronic kidney disease: Secondary | ICD-10-CM | POA: Diagnosis not present

## 2021-12-06 DIAGNOSIS — Z66 Do not resuscitate: Secondary | ICD-10-CM | POA: Diagnosis not present

## 2021-12-06 DIAGNOSIS — D6869 Other thrombophilia: Secondary | ICD-10-CM | POA: Diagnosis not present

## 2021-12-06 DIAGNOSIS — I442 Atrioventricular block, complete: Secondary | ICD-10-CM | POA: Diagnosis not present

## 2021-12-06 NOTE — Telephone Encounter (Signed)
Just an FYI. We have made several attempts to contact this patient including sending a letter to schedule or reschedule their Myoview. We will be removing the patient from the echo/nuc WQ.   11/28/21 MAILED LETTER [facility]  11/28/21 [facility] to schedule x 3 @ 11:15/[facility]  11/23/21 [facility] to schedule @ 10:09/[facility]  11/17/21 [facility] to schedule @ 11:30/[facility]       Thank you

## 2021-12-07 ENCOUNTER — Ambulatory Visit (INDEPENDENT_AMBULATORY_CARE_PROVIDER_SITE_OTHER): Payer: PPO

## 2021-12-07 DIAGNOSIS — I442 Atrioventricular block, complete: Secondary | ICD-10-CM

## 2021-12-07 NOTE — Progress Notes (Signed)
Wound check appointment. Steri-strips removed. Wound without redness or edema. Right lateral corner of incision unapproximated with scant amount of serosanguinous drainage noted, site assessed by Oda Kilts PA, stitch clipped from site.  Normal device function. Thresholds, sensing, and impedances consistent with implant measurements. Device programmed at chronic output values. Histogram distribution appropriate for patient and level of activity. No high ventricular rates noted. Patient educated about wound care. ROV 12/13/2021 for wound re-check

## 2021-12-07 NOTE — Patient Instructions (Signed)
   After Your Pacemaker   Monitor your pacemaker site for redness, swelling, and drainage. Call the device clinic at [phone] if you experience these symptoms or fever/chills.  Your incision was closed with Steri-strips or staples:  You may shower 7 days after your procedure and wash your incision with soap and water. Avoid lotions, ointments, or perfumes over your incision until it is well-healed.  May place clean gauze on incision site it starts draining     Remote monitoring is used to monitor your pacemaker from home. This monitoring is scheduled every 91 days by our office. It allows [place] to keep an eye on the functioning of your device to ensure it is working properly. You will routinely see your Electrophysiologist annually (more often if necessary).

## 2021-12-13 ENCOUNTER — Ambulatory Visit: Payer: PPO

## 2021-12-13 DIAGNOSIS — I442 Atrioventricular block, complete: Secondary | ICD-10-CM

## 2021-12-13 NOTE — Progress Notes (Signed)
Wound re-check incision site assessed by Dr. Lovena Le.  Instructions given for patient to wash incision site BID with warm soapy water and to call device clinic for drainage fever chills or swelling patient voiced understanding

## 2021-12-13 NOTE — Patient Instructions (Signed)
Wash incision site twice daily with clean wash cloth each time and warm soap water, use clean towel to pat dry. Wear clean clothes everyday and clean pajamas to bed.   Call [phone] if incision starts draining, bleeding or fever or chills

## 2021-12-14 ENCOUNTER — Other Ambulatory Visit (HOSPITAL_COMMUNITY): Payer: Self-pay | Admitting: Pharmacist

## 2021-12-14 ENCOUNTER — Other Ambulatory Visit (HOSPITAL_COMMUNITY): Payer: Self-pay | Admitting: Cardiology

## 2021-12-14 MED ORDER — DAPAGLIFLOZIN PROPANEDIOL 10 MG PO TABS: 10.0000 mg | ORAL_TABLET | Freq: Every day | ORAL | 11 refills | Status: DC

## 2021-12-14 NOTE — Progress Notes (Signed)
Patient left VM stating she was doing better on the [place] than the Jardiance. Wishes to continue [place] and asked for prescription to [facility].   Audry Riles, PharmD, BCPS, BCCP, CPP Heart Failure Clinic Pharmacist [phone]

## 2022-01-17 DIAGNOSIS — E7800 Pure hypercholesterolemia, unspecified: Secondary | ICD-10-CM | POA: Diagnosis not present

## 2022-01-17 DIAGNOSIS — R7989 Other specified abnormal findings of blood chemistry: Secondary | ICD-10-CM | POA: Diagnosis not present

## 2022-01-17 DIAGNOSIS — E559 Vitamin D deficiency, unspecified: Secondary | ICD-10-CM | POA: Diagnosis not present

## 2022-01-17 DIAGNOSIS — R739 Hyperglycemia, unspecified: Secondary | ICD-10-CM | POA: Diagnosis not present

## 2022-01-17 DIAGNOSIS — D638 Anemia in other chronic diseases classified elsewhere: Secondary | ICD-10-CM | POA: Diagnosis not present

## 2022-01-19 DIAGNOSIS — Z1331 Encounter for screening for depression: Secondary | ICD-10-CM | POA: Diagnosis not present

## 2022-01-19 DIAGNOSIS — I1310 Hypertensive heart and chronic kidney disease without heart failure, with stage 1 through stage 4 chronic kidney disease, or unspecified chronic kidney disease: Secondary | ICD-10-CM | POA: Diagnosis not present

## 2022-01-19 DIAGNOSIS — E7800 Pure hypercholesterolemia, unspecified: Secondary | ICD-10-CM | POA: Diagnosis not present

## 2022-01-19 DIAGNOSIS — Z0000 Encounter for general adult medical examination without abnormal findings: Secondary | ICD-10-CM | POA: Diagnosis not present

## 2022-01-19 DIAGNOSIS — I509 Heart failure, unspecified: Secondary | ICD-10-CM | POA: Diagnosis not present

## 2022-01-19 DIAGNOSIS — R82998 Other abnormal findings in urine: Secondary | ICD-10-CM | POA: Diagnosis not present

## 2022-01-19 DIAGNOSIS — N184 Chronic kidney disease, stage 4 (severe): Secondary | ICD-10-CM | POA: Diagnosis not present

## 2022-01-19 DIAGNOSIS — I2510 Atherosclerotic heart disease of native coronary artery without angina pectoris: Secondary | ICD-10-CM | POA: Diagnosis not present

## 2022-01-19 DIAGNOSIS — Z7901 Long term (current) use of anticoagulants: Secondary | ICD-10-CM | POA: Diagnosis not present

## 2022-01-19 DIAGNOSIS — E1151 Type 2 diabetes mellitus with diabetic peripheral angiopathy without gangrene: Secondary | ICD-10-CM | POA: Diagnosis not present

## 2022-01-19 DIAGNOSIS — E1140 Type 2 diabetes mellitus with diabetic neuropathy, unspecified: Secondary | ICD-10-CM | POA: Diagnosis not present

## 2022-01-19 DIAGNOSIS — D638 Anemia in other chronic diseases classified elsewhere: Secondary | ICD-10-CM | POA: Diagnosis not present

## 2022-01-19 DIAGNOSIS — I739 Peripheral vascular disease, unspecified: Secondary | ICD-10-CM | POA: Diagnosis not present

## 2022-02-12 DIAGNOSIS — M778 Other enthesopathies, not elsewhere classified: Secondary | ICD-10-CM | POA: Diagnosis not present

## 2022-02-16 ENCOUNTER — Encounter (HOSPITAL_COMMUNITY): Payer: Self-pay | Admitting: Cardiology

## 2022-02-16 ENCOUNTER — Ambulatory Visit (HOSPITAL_COMMUNITY)
Admission: RE | Admit: 2022-02-16 | Discharge: 2022-02-16 | Disposition: A | Discharge status: Home / Self Care | Payer: PPO | Source: Ambulatory Visit | Attending: Cardiology | Admitting: Cardiology

## 2022-02-16 VITALS — BP 92/50 | HR 61 | Wt 119.0 lb

## 2022-02-16 DIAGNOSIS — I4892 Unspecified atrial flutter: Secondary | ICD-10-CM | POA: Diagnosis not present

## 2022-02-16 DIAGNOSIS — I4819 Other persistent atrial fibrillation: Secondary | ICD-10-CM | POA: Insufficient documentation

## 2022-02-16 DIAGNOSIS — N183 Chronic kidney disease, stage 3 unspecified: Secondary | ICD-10-CM | POA: Diagnosis not present

## 2022-02-16 DIAGNOSIS — E1122 Type 2 diabetes mellitus with diabetic chronic kidney disease: Secondary | ICD-10-CM | POA: Insufficient documentation

## 2022-02-16 DIAGNOSIS — I4821 Permanent atrial fibrillation: Secondary | ICD-10-CM | POA: Diagnosis not present

## 2022-02-16 DIAGNOSIS — R42 Dizziness and giddiness: Secondary | ICD-10-CM | POA: Insufficient documentation

## 2022-02-16 DIAGNOSIS — Z79899 Other long term (current) drug therapy: Secondary | ICD-10-CM | POA: Diagnosis not present

## 2022-02-16 DIAGNOSIS — I5022 Chronic systolic (congestive) heart failure: Secondary | ICD-10-CM | POA: Diagnosis not present

## 2022-02-16 DIAGNOSIS — R0602 Shortness of breath: Secondary | ICD-10-CM | POA: Diagnosis not present

## 2022-02-16 DIAGNOSIS — I5042 Chronic combined systolic (congestive) and diastolic (congestive) heart failure: Secondary | ICD-10-CM | POA: Diagnosis not present

## 2022-02-16 DIAGNOSIS — M25512 Pain in left shoulder: Secondary | ICD-10-CM | POA: Diagnosis not present

## 2022-02-16 DIAGNOSIS — Z7984 Long term (current) use of oral hypoglycemic drugs: Secondary | ICD-10-CM | POA: Insufficient documentation

## 2022-02-16 DIAGNOSIS — I428 Other cardiomyopathies: Secondary | ICD-10-CM | POA: Diagnosis not present

## 2022-02-16 DIAGNOSIS — I2510 Atherosclerotic heart disease of native coronary artery without angina pectoris: Secondary | ICD-10-CM | POA: Insufficient documentation

## 2022-02-16 DIAGNOSIS — Z7901 Long term (current) use of anticoagulants: Secondary | ICD-10-CM | POA: Diagnosis not present

## 2022-02-16 LAB — BRAIN NATRIURETIC PEPTIDE: B Natriuretic Peptide: 611.6 pg/mL — ABNORMAL HIGH (ref 0.0–100.0)

## 2022-02-16 LAB — BASIC METABOLIC PANEL
Anion gap: 12 (ref 5–15)
BUN: 32 mg/dL — ABNORMAL HIGH (ref 8–23)
CO2: 23 mmol/L (ref 22–32)
Calcium: 9.2 mg/dL (ref 8.9–10.3)
Chloride: 103 mmol/L (ref 98–111)
Creatinine, Ser: 1.71 mg/dL — ABNORMAL HIGH (ref 0.44–1.00)
GFR, Estimated: 28 mL/min — ABNORMAL LOW (ref >60)
Glucose, Bld: 135 mg/dL — ABNORMAL HIGH (ref 70–99)
Potassium: 3.8 mmol/L (ref 3.5–5.1)
Sodium: 138 mmol/L (ref 135–145)

## 2022-02-16 MED ORDER — FUROSEMIDE 20 MG PO TABS: ORAL_TABLET | ORAL | 3 refills | Status: DC

## 2022-02-16 NOTE — Patient Instructions (Signed)
Increase lasix to '60mg'$  daily for 3 days, then start '60mg'$  daily alternating with '40mg'$  daily.  Labs done today, your results will be available in MyChart, we will contact you for abnormal readings.  Repeat blood work in 10 days  Your physician recommends that you schedule a follow-up appointment in: 6 weeks  If you have any questions or concerns before your next appointment please send [place] a message through [place] or call our office at [phone].    TO LEAVE A MESSAGE FOR THE NURSE SELECT OPTION 2, PLEASE LEAVE A MESSAGE INCLUDING: YOUR NAME DATE OF BIRTH CALL BACK NUMBER REASON FOR CALL**this is important as we prioritize the call backs  YOU WILL RECEIVE A CALL BACK THE SAME DAY AS LONG AS YOU CALL BEFORE 4:00 PM  At the [facility], you and your health needs are our priority. As part of our continuing mission to provide you with exceptional heart care, we have created designated Provider Care Teams. These Care Teams include your primary Cardiologist (physician) and Advanced Practice Providers (APPs- Physician Assistants and Nurse Practitioners) who all work together to provide you with the care you need, when you need it.   You may see any of the following providers on your designated Care Team at your next follow up: Dr Glori Bickers Dr Haynes Kerns, NP Lyda Jester, [facility] [place], [place] Audry Riles, PharmD   Please be sure to bring in all your medications bottles to every appointment.

## 2022-02-18 NOTE — Progress Notes (Signed)
ID:  Shannon [name], DOB [birth year], MRN [id number]  Provider location: [facility] Type of Visit: Established patient   PCP:  Tisovec, Fransico Him, MD  Cardiologist:  Mertie Moores, MD Primary HF: Dr. [name]  Chief Complaint: Shortness of breath   History of Present Illness: Shannon [name] is a 88 y.o. female who has a history of paroxysmal atrial fibrillation, chronic systolic CHF, and CAD. She apparently had been in persistent atrial fibrillation since around 8/16.  It appears that she was tachycardic most of the time prior to her recent admission in 7/17.  She had been on amiodarone prior to admission but it was not keeping her in NSR.  She was admitted with acute systolic CHF.  EF had dropped to 20-25% in 7/17 from 50-55% in 10/16.  She was in atrial fibrillation with RVR.  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  Amiodarone was stopped due to suspicion for amiodarone-induced lung toxicity (she was seen by pulmonary).  She was eventually cardioverted to NSR.  Ranolazine was started to try to keep her in NSR.  HR was low in the hospital so she has not been on a beta blocker.  She was extensively diuresed and discharged to SNF.  After discharge, she developed persistent nausea.  Eventually, it was found that the nausea was likely from ranolazine, so this medication was stopped and the nausea resolved.     Patient continued to have difficult-to-control atrial fibrillation.  GFR was too low for Tikosyn.  She therefore had AV nodal ablation with St Jude CRT-P device placed in 3/18.  She developed a pocket hematoma and had pocket revision with sub-pectoral placement in 5/18.    Echo in 9/19 showed EF up to 60-65%, moderate MR, moderate TR, PASP 43 mmHg. Echo in 10/20 showed EF 55-60%, moderately decreased RV systolic function with moderate RV enlargement, moderate-severe biatrial enlargement, moderate-severe TR, mild MR.    Echo in 4/22 showed EF 55% with mild LVH, mild RV enlargement with mildly decreased RV systolic function, severe biatrial enlargement, mild MR, moderate TR.  Echo in 5/23 showed EF 50-55%, moderate RV enlargement with normal systolic function, PASP 42 mmHg, moderate MR, severe TR.    Patient returns for followup of CHF.  Main complaint is left shoulder pain and limited mobility (cannot abduct beyond 90 degrees and concerned for development of frozen shoulder).  Breathing is stable; she gets short of breath with longer distances.  She tires out standing at the sink to wash dishes.  No dyspnea walking around her house.  Rare lightheadedness with standing, no falls.  Weight is up 3 lbs.  She walks with a cane.     Labs (7/17): Digoxin 1.6 => 0.3, K 4.6, creatinine 0.87 => 0.76, HCT 34, BNP 265 Labs (9/17): K 5, creatinine 1.36 Labs (11/17): K 4.9, creatinine 1.29, hgb 9.2, digoxin 0.5 Labs (12/17): hgb 10.4, digoxin 0.4 Labs (1/18): K 4, creatinine 1.62 => 1.49, hgb 9.5, LDL 42, HDL 63 Labs (3/18): K 4.1, creatinine 1.44, digoxin 0.4 Labs (6/18): K 3.9, creatinine 1.7, hgb 11.5, LDL 56, HDL 50 Labs (6/19): K 4.4, creatinine 1.7, hgb 12.8, LDL 54 Labs (10/19): K 4.2, creatinine 1.46 Labs (6/21): K 4.6, creatinine 1.5, LDL 43, HDL 51 Labs (3/22): BNP 635, LFTs normal, K 3.8, creatinine 1.81 Labs (5/23): K 4.3, creatinine 1.63 => 1.67    PMH: 1. Atrial fibrillation: Paroxysmal.  Had been persistent since 8/16,  was cardioverted to NSR during 7/17 admission.  Possible atypical atrial flutter on 12/17 ECG.  By 3/18, atrial fibrillation again.  - Nausea with ranolazine used for atrial fibrillation suppression.  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  - AV nodal ablation 3/18 with CRT-P.  2. 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  She had been in atrial fibrillation with elevated rate possibly for months prior to 7/17 admission.  - Echo (10/16): EF  50-55%.  - Echo (7/17): EF 20-25%, mildly decreased RV systolic function, severe TR, PA systolic pressure 64 mmHg.  - LHC/RHC (7/17): Patent OM stent, no obstructive CAD.  Mean RA 13, PA 45/25, mean PCWP 16, CI 2.36.  - Echo (1/18): EF 30-35% with diffuse hypokinesis, mildly decreased RV systolic function, moderate TR and MR, PASP 53 mmHg, severe LAE.  - St Jude CRT-P placed with AV nodal ablation in 3/18.  - Echo (9/19): EF 60-65%, moderate LVH, grade 3 diastolic dysfunction, mild MR, mild AI, moderate TR, PASP 43 mmHg.  - Echo (10/20): EF 55-60%, moderately decreased RV systolic function with moderate RV enlargement, moderate-severe biatrial enlargement, moderate-severe TR, mild MR.  - Echo (4/22): EF 55% with mild LVH, mild RV enlargement with mildly decreased RV systolic function, severe biatrial enlargement, mild MR, moderate TR.  - Echo (5/23): EF 50-55%, moderate RV enlargement with normal systolic function, PASP 42 mmHg, moderate MR, severe TR.  3. CAD: DES to OM in 2007.  LHC (7/17) with patent stent and no new obstructive disease.  4. Type II diabetes 5. Hyperlipidemia 6. TIA 7. Possible amiodarone lung toxicity: Chest CT (7/17) with bronchiectasis/fibrosis, patchy bilateral densities.  8. CKD stage 3   Past Surgical History:  Procedure Laterality Date   ATRIAL FLUTTER ABLATION N/A 10/09/2012   Procedure: ATRIAL FLUTTER ABLATION;  Surgeon: Thompson Grayer, MD;  Location: [facility] CATH LAB;  Service: Cardiovascular;  Laterality: N/A;   AV NODE ABLATION N/A 09/27/2016   Procedure: AV Node Ablation;  Surgeon: Thompson Grayer, MD;  Location: [place] CV LAB;  Service: Cardiovascular;  Laterality: N/A;   BIV PACEMAKER INSERTION CRT-P N/A 09/27/2016   Procedure: BiV Pacemaker Insertion CRT-P;  Surgeon: Thompson Grayer, MD;  Location: [place] CV LAB;  Service: Cardiovascular;  Laterality: N/A;   CARDIAC CATHETERIZATION N/A 01/06/2016   Procedure: Right/Left Heart Cath and Coronary Angiography;   Surgeon: Belva Crome, MD;  Location: [place] CV LAB;  Service: Cardiovascular;  Laterality: N/A;   CARDIOVERSION N/A 01/11/2016   Procedure: CARDIOVERSION;  Surgeon: Larey Dresser, MD;  Location: [facility];  Service: Cardiovascular;  Laterality: N/A;   cataracts     COLONOSCOPY N/A 10/13/2015   Procedure: COLONOSCOPY;  Surgeon: Manus Gunning, MD;  Location: [place];  Service: Gastroenterology;  Laterality: N/A;   CORONARY ANGIOPLASTY     Status post PTCA and stenting of the first obtuse marginal-05/13/2006. We placed a 3.0 x 20 mm Taxus stent. It was post dilated using a 3.25 mm noncompliant balloon up to 14 atmospheres   IMPLANT POCKET INCISION & DRAINAGE N/A 11/02/2016   Procedure: Implant Pocket Incision & Drainage;  Surgeon: Evans Lance, MD;  Location: [place] CV LAB;  Service: Cardiovascular;  Laterality: N/A;   PPM GENERATOR CHANGEOUT N/A 11/28/2021   Procedure: PPM GENERATOR CHANGEOUT;  Surgeon: Evans Lance, MD;  Location: [place] CV LAB;  Service: Cardiovascular;  Laterality: N/A;   TONSILLECTOMY       Current Outpatient Medications  Medication Sig Dispense Refill  ACCU-CHEK FASTCLIX LANCETS MISC USE TO CHECK BLOOD SUGAR TWICE DAILY  5   ACCU-CHEK SMARTVIEW test strip      apixaban (ELIQUIS) 2.5 MG TABS tablet Take 1 tablet (2.5 mg total) by mouth 2 (two) times daily. Resume 6/4 with the evening dose 60 tablet 11   atorvastatin (LIPITOR) 20 MG tablet TAKE 1 TABLET EVERY DAY 30 tablet 11   bisoprolol (ZEBETA) 5 MG tablet TAKE 1 TABLET (5 MG TOTAL) BY MOUTH DAILY. 90 tablet 3   Cholecalciferol (VITAMIN D) 2000 units tablet Take 4,000 Units by mouth daily.     dapagliflozin propanediol (FARXIGA) 10 MG TABS tablet Take 1 tablet (10 mg total) by mouth daily before breakfast. 30 tablet 11   Homeopathic Products (ARNICARE ARTHRITIS) TBDP Take 1 tablet by mouth daily as needed (mouth pain).     loratadine (CLARITIN) 10 MG tablet Take 10 mg by mouth daily  as needed for allergies.     metFORMIN (GLUCOPHAGE) 500 MG tablet Take 500 mg by mouth daily with supper.      nitroGLYCERIN (NITROSTAT) 0.4 MG SL tablet TAKE 1 TABLET BY MOUTH UNDER THE TONGUE EVERY 5 MINUTES FOR 3 DOSES AS NEEDED FOR CHEST PAIN 25 tablet 3   OVER THE COUNTER MEDICATION Apply 1 application. topically daily as needed (arthritis). Artrosilium topical cream     OVER THE COUNTER MEDICATION Apply 1 application. topically daily as needed (leg pain). magnilife db foot cream     spironolactone (ALDACTONE) 25 MG tablet TAKE 1 TABLET (25 MG TOTAL) BY MOUTH DAILY. 90 tablet 3   vitamin C (ASCORBIC ACID) 500 MG tablet Take 500 mg by mouth daily.     furosemide (LASIX) 20 MG tablet Take '60mg'$  (3 Tabs ) daily, alternating with '40mg'$  (2 Tabs ) daily 200 tablet 3   No current facility-administered medications for this encounter.    Allergies:   Amiodarone, Ranexa [ranolazine], Other, Demerol, Erythromycin, Pecan nut (diagnostic), Penicillins, Percocet [oxycodone-acetaminophen], and Tomato   Social History:  The patient  reports that she quit smoking about 58 years ago. Her smoking use included cigarettes. She smoked an average of 1 pack per day. She has never used smokeless tobacco. She reports that she does not drink alcohol and does not use drugs.   Family History:  The patient's family history includes Alzheimer's disease in her sister; Coronary artery disease in her sister; Heart attack in her brother, father, mother, and sister; Stroke in her mother and sister.   ROS:  Please see the history of present illness.   All other systems are personally reviewed and negative.   Exam:  BP (!) 92/50   Pulse 61   Wt 54 kg (119 lb)   SpO2 98%   BMI 21.77 kg/m  General: NAD Neck: JVP 8-9 cm, no thyromegaly or thyroid nodule.  Lungs: Clear to auscultation bilaterally with normal respiratory effort. CV: Nondisplaced PMI.  Heart regular S1/S2, no S3/S4, 2/6 HSM LLSB.  1+ ankle edema.  No carotid  bruit.  Normal pedal pulses.  Abdomen: Soft, nontender, no hepatosplenomegaly, no distention.  Skin: Intact without lesions or rashes.  Neurologic: Alert and oriented x 3.  Psych: Normal affect. Extremities: No clubbing or cyanosis.  HEENT: Normal.   Recent Labs: 11/06/2021: Hemoglobin 12.4; Platelets 151 02/16/2022: B Natriuretic Peptide 611.6; BUN 32; Creatinine, Ser 1.71; Potassium 3.8; Sodium 138  Personally reviewed   Wt Readings from Last 3 Encounters:  02/16/22 54 kg (119 lb)  11/28/21 52.2 kg (115 lb)  11/14/21 53 kg (116 lb 12.8 oz)     ASSESSMENT AND PLAN:  1. Chronic systolic=>diastolic CHF: EF 54-49% on 7/17 echo, 30-35% on 1/18 echo.  Nonischemic cardiomyopathy, possible role for tachycardia-mediated cardiomyopathy (some improvement in EF with rate control but not back to normal). ECHO 03/2017 EF 40-45%.  She had AV nodal ablation due to difficult-to-control atrial fibrillation in 3/18 with St Jude CRT-P placement. Repeat echo in 9/19 showed EF up to 60-65%.  Echo in 10/20 with EF 55-60%, moderately decreased RV systolic function with moderate RV enlargement, moderate-severe biatrial enlargement, moderate-severe TR, mild MR.  Echo in 4/22 with EF 55% with mild LVH, mild RV enlargement with mildly decreased RV systolic function, severe biatrial enlargement, mild MR, moderate TR.  Echo (5/23) with EF 50-55%, moderate RV enlargement with normal systolic function, PASP 42 mmHg, moderate MR, severe TR.  She is mildly volume overloaded on exam today, NYHA class III symptoms.  - Increase Lasix to 60 mg daily x 3 days then alternate 60 mg daily with 40 mg daily.  BMET/BNP today and BMET in 10 days.  - Unable to tolerate losartan at even low dose.  - Continue bisoprolol 5 mg daily.   - Continue spironolactone 25 mg daily (take at bedtime).    - Continue dapagliflozin 10 mg daily.  2. Atrial fibrillation/flutter:  Concern for prior tachy-mediated cardiomyopathy.  She had suspected  amiodarone-induced lung toxicity so ranolazine was started.  She did not tolerate ranolazine due to nausea. Poor Tikosyn candidate with CKD.   She tolerated atrial fibrillation poorly and rate was difficult to control, so she had AV nodal ablation with St Jude CRT-P device.  She is in chronic AF.  - Continue apixaban 2.5 mg bid (age, creatinine).  3. CAD: Patent OM stent on coronary angiography in 7/17.  No recent chest pain.  - Continue statin - No ASA with apixaban use.   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  Off amiodarone.  Follows with pulmonary.  5. CKD stage 3: BMET in 10 days on increased Lasix.   Recommended follow-up:  6 wks with APP.   Signed, [name], MD  02/18/2022  [place] [address] [facility] [place] [address] [phone] (office) [phone] (fax)

## 2022-02-28 ENCOUNTER — Ambulatory Visit (HOSPITAL_COMMUNITY)
Admission: RE | Admit: 2022-02-28 | Discharge: 2022-02-28 | Disposition: A | Discharge status: Home / Self Care | Payer: PPO | Source: Ambulatory Visit | Attending: Cardiology | Admitting: Cardiology

## 2022-02-28 DIAGNOSIS — I5022 Chronic systolic (congestive) heart failure: Secondary | ICD-10-CM | POA: Insufficient documentation

## 2022-02-28 LAB — BASIC METABOLIC PANEL
Anion gap: 16 — ABNORMAL HIGH (ref 5–15)
BUN: 47 mg/dL — ABNORMAL HIGH (ref 8–23)
CO2: 23 mmol/L (ref 22–32)
Calcium: 9.6 mg/dL (ref 8.9–10.3)
Chloride: 97 mmol/L — ABNORMAL LOW (ref 98–111)
Creatinine, Ser: 2.19 mg/dL — ABNORMAL HIGH (ref 0.44–1.00)
GFR, Estimated: 21 mL/min — ABNORMAL LOW (ref >60)
Glucose, Bld: 224 mg/dL — ABNORMAL HIGH (ref 70–99)
Potassium: 3.9 mmol/L (ref 3.5–5.1)
Sodium: 136 mmol/L (ref 135–145)

## 2022-03-01 ENCOUNTER — Telehealth (HOSPITAL_COMMUNITY): Payer: Self-pay

## 2022-03-01 DIAGNOSIS — I5022 Chronic systolic (congestive) heart failure: Secondary | ICD-10-CM

## 2022-03-01 MED ORDER — FUROSEMIDE 20 MG PO TABS: 40.0000 mg | ORAL_TABLET | Freq: Every day | ORAL | 3 refills | Status: DC

## 2022-03-01 NOTE — Telephone Encounter (Addendum)
Pt aware, agreeable, and verbalized understanding  Labs scheduled med list updated   ----- Message from Larey Dresser, MD sent at 03/01/2022 12:24 AM EDT ----- Creatinine up.  Hold Lasix for 1 day then decrease to 40 mg daily.  BMET 1 week.

## 2022-03-02 ENCOUNTER — Ambulatory Visit (INDEPENDENT_AMBULATORY_CARE_PROVIDER_SITE_OTHER): Payer: PPO

## 2022-03-02 DIAGNOSIS — I428 Other cardiomyopathies: Secondary | ICD-10-CM | POA: Diagnosis not present

## 2022-03-05 LAB — CUP PACEART REMOTE DEVICE CHECK
Battery Remaining Longevity: 50 mo
Battery Remaining Percentage: 95.5 %
Battery Voltage: 2.99 V
Date Time Interrogation Session: 20230901020013
Implantable Lead Implant Date: 20180329
Implantable Lead Implant Date: 20180329
Implantable Lead Implant Date: 20180329
Implantable Lead Location: 753859
Implantable Lead Location: 753860
Implantable Lead Location: 753860
Implantable Lead Model: 3830
Implantable Lead Model: 5076
Implantable Pulse Generator Implant Date: 20230530
Lead Channel Impedance Value: 290 Ohm
Lead Channel Impedance Value: 350 Ohm
Lead Channel Pacing Threshold Amplitude: 1 V
Lead Channel Pacing Threshold Amplitude: 2 V
Lead Channel Pacing Threshold Pulse Width: 0.5 ms
Lead Channel Pacing Threshold Pulse Width: 1 ms
Lead Channel Setting Pacing Amplitude: 2 V
Lead Channel Setting Pacing Amplitude: 3 V
Lead Channel Setting Pacing Pulse Width: 0.5 ms
Lead Channel Setting Pacing Pulse Width: 1 ms
Lead Channel Setting Sensing Sensitivity: 4 mV
Pulse Gen Model: 3222
Pulse Gen Serial Number: 8080981

## 2022-03-13 ENCOUNTER — Ambulatory Visit: Payer: PPO | Attending: Internal Medicine | Admitting: Internal Medicine

## 2022-03-13 ENCOUNTER — Encounter: Payer: Self-pay | Admitting: Internal Medicine

## 2022-03-13 VITALS — BP 98/64 | HR 60 | Ht 62.0 in | Wt 120.0 lb

## 2022-03-13 DIAGNOSIS — Z950 Presence of cardiac pacemaker: Secondary | ICD-10-CM | POA: Diagnosis not present

## 2022-03-13 DIAGNOSIS — I442 Atrioventricular block, complete: Secondary | ICD-10-CM | POA: Diagnosis not present

## 2022-03-13 DIAGNOSIS — I4819 Other persistent atrial fibrillation: Secondary | ICD-10-CM | POA: Diagnosis not present

## 2022-03-13 MED ORDER — FUROSEMIDE 20 MG PO TABS: ORAL_TABLET | ORAL | 3 refills | Status: DC

## 2022-03-13 NOTE — Patient Instructions (Addendum)
Medication Instructions:  Your physician has recommended you make the following change in your medication:  Lasix Increased:  60 Mg  You will take 3 tablets ( 60 Mg ) by mouth for 3 days.    Then return to taking 2 tablets ( 40 Mg ) by mouth as ordered / returning to your normal lasix schedule.     Lab Work: None ordered.  If you have labs (blood work) drawn today and your tests are completely normal, you will receive your results only by: Onward (if you have MyChart) OR A paper copy in the mail If you have any lab test that is abnormal or we need to change your treatment, we will call you to review the results.  Testing/Procedures: None ordered.  Follow-Up: At [facility], you and your health needs are our priority.  As part of our continuing mission to provide you with exceptional heart care, we have created designated Provider Care Teams.  These Care Teams include your primary Cardiologist (physician) and Advanced Practice Providers (APPs -  Physician Assistants and Nurse Practitioners) who all work together to provide you with the care you need, when you need it.  We recommend signing up for the patient portal called "MyChart".  Sign up information is provided on this After Visit Summary.  MyChart is used to connect with patients for Virtual Visits (Telemedicine).  Patients are able to view lab/test results, encounter notes, upcoming appointments, etc.  Non-urgent messages can be sent to your provider as well.   To learn more about what you can do with MyChart, go to [email or web address].    Your next appointment:   1 year(s) with APP  The format for your next appointment:   In Person  Provider:   Tommye Standard, PA-C Lollie Marrow, PA-C  Remote monitoring is used to monitor your Pacemaker from home. This monitoring reduces the number of office visits required to check your device to one time per year. It allows [place] to keep an eye on the functioning of your  device to ensure it is working properly. You are scheduled for a device check from home on 06/01/22. You may send your transmission at any time that day. If you have a wireless device, the transmission will be sent automatically. After your physician reviews your transmission, you will receive a postcard with your next transmission date.  Important Information About Sugar

## 2022-03-13 NOTE — Progress Notes (Signed)
HPI Shannon [name] returns today after a long absence from our EP clinic. She is a pleasant 88 yo woman with a h/o CHB and chronic systolic heart failure s/p PPM insertion. I saw her last 5 years ago when she had to have a pacemaker pocket hematoma evac and relocation of her pocket to a sub pec location. She has now undergone PM generator change out. She feels well with no chest pain or sob. She has become more sedentary.  Allergies  Allergen Reactions   Amiodarone Anaphylaxis   Ranexa [Ranolazine] Nausea And Vomiting   Other Other (See Comments)    Vitamin E - causes heart rate to increase   Demerol Other (See Comments)    Burning sensation all over body   Erythromycin Nausea And Vomiting   Pecan Nut (Diagnostic) Diarrhea   Penicillins Swelling   Percocet [Oxycodone-Acetaminophen] Swelling   Tomato Rash    In large amounts      Current Outpatient Medications  Medication Sig Dispense Refill   ACCU-CHEK FASTCLIX LANCETS MISC USE TO CHECK BLOOD SUGAR TWICE DAILY  5   ACCU-CHEK SMARTVIEW test strip      apixaban (ELIQUIS) 2.5 MG TABS tablet Take 1 tablet (2.5 mg total) by mouth 2 (two) times daily. Resume 6/4 with the evening dose 60 tablet 11   atorvastatin (LIPITOR) 20 MG tablet TAKE 1 TABLET EVERY DAY 30 tablet 11   bisoprolol (ZEBETA) 5 MG tablet TAKE 1 TABLET (5 MG TOTAL) BY MOUTH DAILY. 90 tablet 3   Cholecalciferol (VITAMIN D) 2000 units tablet Take 4,000 Units by mouth daily.     dapagliflozin propanediol (FARXIGA) 10 MG TABS tablet Take 1 tablet (10 mg total) by mouth daily before breakfast. 30 tablet 11   Homeopathic Products (ARNICARE ARTHRITIS) TBDP Take 1 tablet by mouth daily as needed (mouth pain).     loratadine (CLARITIN) 10 MG tablet Take 10 mg by mouth daily as needed for allergies.     metFORMIN (GLUCOPHAGE) 500 MG tablet Take 500 mg by mouth daily with supper.      nitroGLYCERIN (NITROSTAT) 0.4 MG SL tablet TAKE 1 TABLET BY MOUTH UNDER THE TONGUE EVERY 5  MINUTES FOR 3 DOSES AS NEEDED FOR CHEST PAIN 25 tablet 3   OVER THE COUNTER MEDICATION Apply 1 application. topically daily as needed (arthritis). Artrosilium topical cream     OVER THE COUNTER MEDICATION Apply 1 application. topically daily as needed (leg pain). magnilife db foot cream     spironolactone (ALDACTONE) 25 MG tablet TAKE 1 TABLET (25 MG TOTAL) BY MOUTH DAILY. 90 tablet 3   vitamin C (ASCORBIC ACID) 500 MG tablet Take 500 mg by mouth daily.     furosemide (LASIX) 20 MG tablet Take '60mg'$  (3 Tabs ) daily, alternating with '40mg'$  (2 Tabs ) daily 200 tablet 3   No current facility-administered medications for this visit.     Past Medical History:  Diagnosis Date   Arthritis    Baker's cyst of knee    CAD (coronary artery disease)    a. 05/2006 Cath/PCI: LAD 81m D1 40 ost, LCX nl, OM1 95 (3.0x20 Taxus DES), RCA nl, EF 40% w/ lat AK;  04/2007 Ex MV: minimal lat ischemia in area of prior infarct->low risk-> med Rx.   Chronic atrial fibrillation (HCC)    a. on Coumadin   Chronic systolic [facility]congestive) heart failure ([facility]    a. 04/2015: EF 50-55% b. echo 12/2015: EF 20-25% w/ diffuse HK, biatrial  enlargement, mild AI and MR, severe TR     Diabetes mellitus    Hyperlipidemia    MVP (mitral valve prolapse)    a. 10/2003 Echo: nl LV fxn, mild MR/TR/AS   Myocardial infarction [facility])    Sleep apnea    Transient ischemic attack     ROS:   All systems reviewed and negative except as noted in the HPI.   Past Surgical History:  Procedure Laterality Date   ATRIAL FLUTTER ABLATION N/A 10/09/2012   Procedure: ATRIAL FLUTTER ABLATION;  Surgeon: Thompson Grayer, MD;  Location: [facility] CATH LAB;  Service: Cardiovascular;  Laterality: N/A;   AV NODE ABLATION N/A 09/27/2016   Procedure: AV Node Ablation;  Surgeon: Thompson Grayer, MD;  Location: [place] CV LAB;  Service: Cardiovascular;  Laterality: N/A;   BIV PACEMAKER INSERTION CRT-P N/A 09/27/2016   Procedure: BiV Pacemaker Insertion CRT-P;   Surgeon: Thompson Grayer, MD;  Location: [place] CV LAB;  Service: Cardiovascular;  Laterality: N/A;   CARDIAC CATHETERIZATION N/A 01/06/2016   Procedure: Right/Left Heart Cath and Coronary Angiography;  Surgeon: Belva Crome, MD;  Location: [place] CV LAB;  Service: Cardiovascular;  Laterality: N/A;   CARDIOVERSION N/A 01/11/2016   Procedure: CARDIOVERSION;  Surgeon: Larey Dresser, MD;  Location: [place];  Service: Cardiovascular;  Laterality: N/A;   cataracts     COLONOSCOPY N/A 10/13/2015   Procedure: COLONOSCOPY;  Surgeon: Manus Gunning, MD;  Location: [place];  Service: Gastroenterology;  Laterality: N/A;   CORONARY ANGIOPLASTY     Status post PTCA and stenting of the first obtuse marginal-05/13/2006. We placed a 3.0 x 20 mm Taxus stent. It was post dilated using a 3.25 mm noncompliant balloon up to 14 atmospheres   IMPLANT POCKET INCISION & DRAINAGE N/A 11/02/2016   Procedure: Implant Pocket Incision & Drainage;  Surgeon: Evans Lance, MD;  Location: [place] CV LAB;  Service: Cardiovascular;  Laterality: N/A;   PPM GENERATOR CHANGEOUT N/A 11/28/2021   Procedure: PPM GENERATOR CHANGEOUT;  Surgeon: Evans Lance, MD;  Location: [place] CV LAB;  Service: Cardiovascular;  Laterality: N/A;   TONSILLECTOMY       Family History  Problem Relation Age of Onset   Heart attack Mother    Stroke Mother    Heart attack Father    Heart attack Brother    Coronary artery disease Sister        CABG   Alzheimer's disease Sister        X3   Stroke Sister    Heart attack Sister      Social History   Socioeconomic History   Marital status: Married    Spouse name: Not on file   Number of children: Not on file   Years of education: Not on file   Highest education level: Not on file  Occupational History   Not on file  Tobacco Use   Smoking status: Former    Packs/day: 1.00    Types: Cigarettes    Quit date: 07/03/1963    Years since quitting: 58.7    Smokeless tobacco: Never  Substance and Sexual Activity   Alcohol use: No   Drug use: No   Sexual activity: Never  Other Topics Concern   Not on file  Social History Narrative   Lives in [place] with husband.  Does not routinely exercise but is able to keep house and go food shopping.   Social Determinants of Radio broadcast assistant  Strain: Not on file  Food Insecurity: Not on file  Transportation Needs: Not on file  Physical Activity: Not on file  Stress: Not on file  Social Connections: Not on file  Intimate Partner Violence: Not on file     BP 98/64   Pulse 60   Ht '5\' 2"'$  (1.575 m)   Wt 120 lb (54.4 kg)   BMI 21.95 kg/m   Physical Exam:  Well appearing NAD HEENT: Unremarkable Neck:  No JVD, no thyromegally Lymphatics:  No adenopathy Back:  No CVA tenderness Lungs:  Clear with no wheezes HEART:  Regular rate rhythm, no murmurs, no rubs, no clicks Abd:  soft, positive bowel sounds, no organomegally, no rebound, no guarding Ext:  2 plus pulses, no edema, no cyanosis, no clubbing Skin:  No rashes no nodules Neuro:  CN II through XII intact, motor grossly intact  EKG - atrial fib with ventricular pacing  DEVICE  Normal device function.  See PaceArt for details.   Assess/Plan:  Chronic systolic heart failure - her symptoms are class 2-3. She has a bit of extra fluid in her legs. I asked her to take 60 mg of lasix for 3 days before dropping back to 40 mg daily. I encouraged her to avoid salty foods. Atrial fib/flutter - she is well controlled s/p PPM insertion CHB - she is asymptomatic s/p biv PM insertion. CAD - she denies anginal symptoms. We will follow.   Carleene Overlie Conita Amenta,MD

## 2022-03-14 ENCOUNTER — Ambulatory Visit (HOSPITAL_COMMUNITY)
Admission: RE | Admit: 2022-03-14 | Discharge: 2022-03-14 | Disposition: A | Discharge status: Home / Self Care | Payer: PPO | Source: Ambulatory Visit | Attending: Internal Medicine | Admitting: Internal Medicine

## 2022-03-14 DIAGNOSIS — I5022 Chronic systolic (congestive) heart failure: Secondary | ICD-10-CM

## 2022-03-14 LAB — CUP PACEART INCLINIC DEVICE CHECK
Battery Remaining Longevity: 42 mo
Battery Voltage: 2.99 V
Brady Statistic RA Percent Paced: 0 %
Brady Statistic RV Percent Paced: 99.95 %
Date Time Interrogation Session: 20230912163750
Implantable Lead Implant Date: 20180329
Implantable Lead Implant Date: 20180329
Implantable Lead Implant Date: 20180329
Implantable Lead Location: 753859
Implantable Lead Location: 753860
Implantable Lead Location: 753860
Implantable Lead Model: 3830
Implantable Lead Model: 5076
Implantable Pulse Generator Implant Date: 20230530
Lead Channel Impedance Value: 312.5 Ohm
Lead Channel Impedance Value: 350 Ohm
Lead Channel Pacing Threshold Amplitude: 1 V
Lead Channel Pacing Threshold Amplitude: 1 V
Lead Channel Pacing Threshold Amplitude: 2.5 V
Lead Channel Pacing Threshold Amplitude: 2.5 V
Lead Channel Pacing Threshold Pulse Width: 0.5 ms
Lead Channel Pacing Threshold Pulse Width: 0.5 ms
Lead Channel Pacing Threshold Pulse Width: 1 ms
Lead Channel Pacing Threshold Pulse Width: 1 ms
Lead Channel Setting Pacing Amplitude: 2 V
Lead Channel Setting Pacing Amplitude: 3.5 V
Lead Channel Setting Pacing Pulse Width: 0.5 ms
Lead Channel Setting Pacing Pulse Width: 1 ms
Lead Channel Setting Sensing Sensitivity: 4 mV
Pulse Gen Model: 3222
Pulse Gen Serial Number: 8080981

## 2022-03-14 LAB — BASIC METABOLIC PANEL
Anion gap: 9 (ref 5–15)
BUN: 43 mg/dL — ABNORMAL HIGH (ref 8–23)
CO2: 23 mmol/L (ref 22–32)
Calcium: 9.3 mg/dL (ref 8.9–10.3)
Chloride: 104 mmol/L (ref 98–111)
Creatinine, Ser: 2.11 mg/dL — ABNORMAL HIGH (ref 0.44–1.00)
GFR, Estimated: 22 mL/min — ABNORMAL LOW (ref >60)
Glucose, Bld: 150 mg/dL — ABNORMAL HIGH (ref 70–99)
Potassium: 4 mmol/L (ref 3.5–5.1)
Sodium: 136 mmol/L (ref 135–145)

## 2022-03-21 NOTE — Progress Notes (Signed)
Remote pacemaker transmission.   

## 2022-03-30 ENCOUNTER — Encounter (HOSPITAL_COMMUNITY): Payer: Self-pay

## 2022-03-30 ENCOUNTER — Ambulatory Visit (HOSPITAL_COMMUNITY)
Admission: RE | Admit: 2022-03-30 | Discharge: 2022-03-30 | Disposition: A | Discharge status: Home / Self Care | Payer: PPO | Source: Ambulatory Visit | Attending: Family Medicine | Admitting: Family Medicine

## 2022-03-30 VITALS — BP 100/58 | HR 60 | Ht 62.0 in | Wt 119.0 lb

## 2022-03-30 DIAGNOSIS — I428 Other cardiomyopathies: Secondary | ICD-10-CM | POA: Insufficient documentation

## 2022-03-30 DIAGNOSIS — Z79899 Other long term (current) drug therapy: Secondary | ICD-10-CM | POA: Insufficient documentation

## 2022-03-30 DIAGNOSIS — R110 Nausea: Secondary | ICD-10-CM | POA: Diagnosis not present

## 2022-03-30 DIAGNOSIS — I4819 Other persistent atrial fibrillation: Secondary | ICD-10-CM | POA: Diagnosis not present

## 2022-03-30 DIAGNOSIS — K219 Gastro-esophageal reflux disease without esophagitis: Secondary | ICD-10-CM | POA: Diagnosis not present

## 2022-03-30 DIAGNOSIS — R0789 Other chest pain: Secondary | ICD-10-CM | POA: Insufficient documentation

## 2022-03-30 DIAGNOSIS — E1136 Type 2 diabetes mellitus with diabetic cataract: Secondary | ICD-10-CM | POA: Insufficient documentation

## 2022-03-30 DIAGNOSIS — Z7901 Long term (current) use of anticoagulants: Secondary | ICD-10-CM | POA: Diagnosis not present

## 2022-03-30 DIAGNOSIS — N183 Chronic kidney disease, stage 3 unspecified: Secondary | ICD-10-CM | POA: Diagnosis not present

## 2022-03-30 DIAGNOSIS — I5042 Chronic combined systolic (congestive) and diastolic (congestive) heart failure: Secondary | ICD-10-CM | POA: Diagnosis not present

## 2022-03-30 DIAGNOSIS — I4892 Unspecified atrial flutter: Secondary | ICD-10-CM | POA: Insufficient documentation

## 2022-03-30 DIAGNOSIS — I2510 Atherosclerotic heart disease of native coronary artery without angina pectoris: Secondary | ICD-10-CM | POA: Diagnosis not present

## 2022-03-30 DIAGNOSIS — E1122 Type 2 diabetes mellitus with diabetic chronic kidney disease: Secondary | ICD-10-CM | POA: Diagnosis not present

## 2022-03-30 DIAGNOSIS — Z950 Presence of cardiac pacemaker: Secondary | ICD-10-CM

## 2022-03-30 DIAGNOSIS — Z7984 Long term (current) use of oral hypoglycemic drugs: Secondary | ICD-10-CM | POA: Insufficient documentation

## 2022-03-30 DIAGNOSIS — I5022 Chronic systolic (congestive) heart failure: Secondary | ICD-10-CM

## 2022-03-30 LAB — BASIC METABOLIC PANEL
Anion gap: 13 (ref 5–15)
BUN: 48 mg/dL — ABNORMAL HIGH (ref 8–23)
CO2: 21 mmol/L — ABNORMAL LOW (ref 22–32)
Calcium: 9.2 mg/dL (ref 8.9–10.3)
Chloride: 102 mmol/L (ref 98–111)
Creatinine, Ser: 2.04 mg/dL — ABNORMAL HIGH (ref 0.44–1.00)
GFR, Estimated: 23 mL/min — ABNORMAL LOW (ref >60)
Glucose, Bld: 119 mg/dL — ABNORMAL HIGH (ref 70–99)
Potassium: 3.7 mmol/L (ref 3.5–5.1)
Sodium: 136 mmol/L (ref 135–145)

## 2022-03-30 LAB — BRAIN NATRIURETIC PEPTIDE: B Natriuretic Peptide: 800.1 pg/mL — ABNORMAL HIGH (ref 0.0–100.0)

## 2022-03-30 NOTE — Progress Notes (Signed)
ReDS Vest / Clip - 03/30/22 1139       ReDS Vest / Clip   Station Marker A    Ruler Value 29    ReDS Value Range Low volume    ReDS Actual Value 26    Anatomical Comments sitting

## 2022-03-30 NOTE — Progress Notes (Signed)
ID:  KILEY SOLIMINE, DOB March 12, 1932, MRN [id number]  Provider location: [facility] Type of Visit: Established patient   PCP:  Tisovec, Fransico Him, MD  Cardiologist:  Mertie Moores, MD Primary HF: Dr. [name]  Chief Complaint: HF follow up   History of Present Illness: ROSALIE BUENAVENTURA is a 88 y.o. female who has a history of paroxysmal atrial fibrillation, chronic systolic CHF, and CAD. She apparently had been in persistent atrial fibrillation since around 8/16.  It appears that she was tachycardic most of the time prior to her recent admission in 7/17.  She had been on amiodarone prior to admission but it was not keeping her in NSR.  She was admitted with acute systolic CHF.  EF had dropped to 20-25% in 7/17 from 50-55% in 10/16.  She was in atrial fibrillation with RVR.  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  Amiodarone was stopped due to suspicion for amiodarone-induced lung toxicity (she was seen by pulmonary).  She was eventually cardioverted to NSR.  Ranolazine was started to try to keep her in NSR.  HR was low in the hospital so she has not been on a beta blocker.  She was extensively diuresed and discharged to SNF.  After discharge, she developed persistent nausea.  Eventually, it was found that the nausea was likely from ranolazine, so this medication was stopped and the nausea resolved.     Patient continued to have difficult-to-control atrial fibrillation.  GFR was too low for Tikosyn.  She therefore had AV nodal ablation with St Jude CRT-P device placed in 3/18.  She developed a pocket hematoma and had pocket revision with sub-pectoral placement in 5/18.    Echo in 9/19 showed EF up to 60-65%, moderate MR, moderate TR, PASP 43 mmHg. Echo in 10/20 showed EF 55-60%, moderately decreased RV systolic function with moderate RV enlargement, moderate-severe biatrial enlargement, moderate-severe TR, mild MR.    Echo in 4/22 showed EF 55% with mild LVH, mild RV enlargement with mildly decreased RV systolic function, severe biatrial enlargement, mild MR, moderate TR.  Echo in 5/23 showed EF 50-55%, moderate RV enlargement with normal systolic function, PASP 42 mmHg, moderate MR, severe TR.   Today she returns for HF follow up with her son. Overall feeling fine. Not very active, she says she is lazy, but no dyspnea walking around the house or completing ADLs. She walks with a cane, uses a WC when outside of the house. Has some LE edema. She took a nitro last week for chest discomfort, but she attributed that to gas/GERD. Denies palpitations, dizziness, or PND/Orthopnea. Appetite ok. No fever or chills. Weight at home stable. Taking all medications. Likes to eat [place] (7/17): Digoxin 1.6 => 0.3, K 4.6, creatinine 0.87 => 0.76, HCT 34, BNP 265 Labs (9/17): K 5, creatinine 1.36 Labs (11/17): K 4.9, creatinine 1.29, hgb 9.2, digoxin 0.5 Labs (12/17): hgb 10.4, digoxin 0.4 Labs (1/18): K 4, creatinine 1.62 => 1.49, hgb 9.5, LDL 42, HDL 63 Labs (3/18): K 4.1, creatinine 1.44, digoxin 0.4 Labs (6/18): K 3.9, creatinine 1.7, hgb 11.5, LDL 56, HDL 50 Labs (6/19): K 4.4, creatinine 1.7, hgb 12.8, LDL 54 Labs (10/19): K 4.2, creatinine 1.46 Labs (6/21): K 4.6, creatinine 1.5, LDL 43, HDL 51 Labs (3/22): BNP 635, LFTs normal, K 3.8, creatinine 1.81 Labs (5/23): K 4.3, creatinine 1.63 => 1.67 Labs (9/23): K 4.0, creatinine 2.11  PMH: 1. Atrial fibrillation: Paroxysmal.  Had been persistent since 8/16, was cardioverted to NSR during 7/17 admission.  Possible atypical atrial flutter on 12/17 ECG.  By 3/18, atrial fibrillation again.  - Nausea with ranolazine used for atrial fibrillation suppression.  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  - AV nodal ablation 3/18 with CRT-P.  2. 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  She had been in atrial  fibrillation with elevated rate possibly for months prior to 7/17 admission.  - Echo (10/16): EF 50-55%.  - Echo (7/17): EF 20-25%, mildly decreased RV systolic function, severe TR, PA systolic pressure 64 mmHg.  - LHC/RHC (7/17): Patent OM stent, no obstructive CAD.  Mean RA 13, PA 45/25, mean PCWP 16, CI 2.36.  - Echo (1/18): EF 30-35% with diffuse hypokinesis, mildly decreased RV systolic function, moderate TR and MR, PASP 53 mmHg, severe LAE.  - St Jude CRT-P placed with AV nodal ablation in 3/18.  - Echo (9/19): EF 60-65%, moderate LVH, grade 3 diastolic dysfunction, mild MR, mild AI, moderate TR, PASP 43 mmHg.  - Echo (10/20): EF 55-60%, moderately decreased RV systolic function with moderate RV enlargement, moderate-severe biatrial enlargement, moderate-severe TR, mild MR.  - Echo (4/22): EF 55% with mild LVH, mild RV enlargement with mildly decreased RV systolic function, severe biatrial enlargement, mild MR, moderate TR.  - Echo (5/23): EF 50-55%, moderate RV enlargement with normal systolic function, PASP 42 mmHg, moderate MR, severe TR.  3. CAD: DES to OM in 2007.  LHC (7/17) with patent stent and no new obstructive disease.  4. Type II diabetes 5. Hyperlipidemia 6. TIA 7. Possible amiodarone lung toxicity: Chest CT (7/17) with bronchiectasis/fibrosis, patchy bilateral densities.  8. CKD stage 3  Past Surgical History:  Procedure Laterality Date   ATRIAL FLUTTER ABLATION N/A 10/09/2012   Procedure: ATRIAL FLUTTER ABLATION;  Surgeon: Thompson Grayer, MD;  Location[facility] CATH LAB;  Service: Cardiovascular;  Laterality: N/A;   AV NODE ABLATION N/A 09/27/2016   Procedure: AV Node Ablation;  Surgeon: Thompson Grayer, MD;  Location: [place] CV LAB;  Service: Cardiovascular;  Laterality: N/A;   BIV PACEMAKER INSERTION CRT-P N/A 09/27/2016   Procedure: BiV Pacemaker Insertion CRT-P;  Surgeon: Thompson Grayer, MD;  Location: [place] [facility] LAB;  Service: Cardiovascular;  Laterality: N/A;   CARDIAC  CATHETERIZATION N/A 01/06/2016   Procedure: Right/Left Heart Cath and Coronary Angiography;  Surgeon: Belva Crome, MD;  Location: [place] CV LAB;  Service: Cardiovascular;  Laterality: N/A;   CARDIOVERSION N/A 01/11/2016   Procedure: CARDIOVERSION;  Surgeon: Larey Dresser, MD;  Location: [place];  Service: Cardiovascular;  Laterality: N/A;   cataracts     COLONOSCOPY N/A 10/13/2015   Procedure: COLONOSCOPY;  Surgeon: Manus Gunning, MD;  Location: [place];  Service: Gastroenterology;  Laterality: N/A;   CORONARY ANGIOPLASTY     Status post PTCA and stenting of the first obtuse marginal-05/13/2006. We placed a 3.0 x 20 mm Taxus stent. It was post dilated using a 3.25 mm noncompliant balloon up to 14 atmospheres   IMPLANT POCKET INCISION & DRAINAGE N/A 11/02/2016   Procedure: Implant Pocket Incision & Drainage;  Surgeon: Evans Lance, MD;  Location: [place] [facility] LAB;  Service: Cardiovascular;  Laterality: N/A;   PPM GENERATOR CHANGEOUT N/A 11/28/2021   Procedure: PPM GENERATOR CHANGEOUT;  Surgeon: Evans Lance, MD;  Location: [place] [facility] LAB;  Service: Cardiovascular;  Laterality: N/A;   TONSILLECTOMY     Current  Outpatient Medications  Medication Sig Dispense Refill   ACCU-CHEK FASTCLIX LANCETS MISC USE TO CHECK BLOOD SUGAR TWICE DAILY  5   ACCU-CHEK SMARTVIEW test strip      apixaban (ELIQUIS) 2.5 MG TABS tablet Take 1 tablet (2.5 mg total) by mouth 2 (two) times daily. Resume 6/4 with the evening dose 60 tablet 11   atorvastatin (LIPITOR) 20 MG tablet TAKE 1 TABLET EVERY DAY 30 tablet 11   bisoprolol (ZEBETA) 5 MG tablet TAKE 1 TABLET (5 MG TOTAL) BY MOUTH DAILY. 90 tablet 3   Cholecalciferol (VITAMIN D) 2000 units tablet Take 4,000 Units by mouth daily.     dapagliflozin propanediol (FARXIGA) 10 MG TABS tablet Take 1 tablet (10 mg total) by mouth daily before breakfast. 30 tablet 11   furosemide (LASIX) 20 MG tablet Take '60mg'$  (3 Tabs ) daily, alternating with  '40mg'$  (2 Tabs ) daily 200 tablet 3   Homeopathic Products (ARNICARE ARTHRITIS) TBDP Take 1 tablet by mouth daily as needed (mouth pain).     loratadine (CLARITIN) 10 MG tablet Take 10 mg by mouth daily as needed for allergies.     metFORMIN (GLUCOPHAGE) 500 MG tablet Take 500 mg by mouth daily with supper.      nitroGLYCERIN (NITROSTAT) 0.4 MG SL tablet TAKE 1 TABLET BY MOUTH UNDER THE TONGUE EVERY 5 MINUTES FOR 3 DOSES AS NEEDED FOR CHEST PAIN 25 tablet 3   OVER THE COUNTER MEDICATION Apply 1 application. topically daily as needed (arthritis). Artrosilium topical cream     OVER THE COUNTER MEDICATION Apply 1 application. topically daily as needed (leg pain). magnilife db foot cream     spironolactone (ALDACTONE) 25 MG tablet TAKE 1 TABLET (25 MG TOTAL) BY MOUTH DAILY. 90 tablet 3   vitamin C (ASCORBIC ACID) 500 MG tablet Take 500 mg by mouth daily.     No current facility-administered medications for this encounter.    Allergies:   Amiodarone, Ranexa [ranolazine], Other, Demerol, Erythromycin, Pecan nut (diagnostic), Penicillins, Percocet [oxycodone-acetaminophen], and Tomato   Social History:  The patient  reports that she quit smoking about 58 years ago. Her smoking use included cigarettes. She smoked an average of 1 pack per day. She has never used smokeless tobacco. She reports that she does not drink alcohol and does not use drugs.   Family History:  The patient's family history includes Alzheimer's disease in her sister; Coronary artery disease in her sister; Heart attack in her brother, father, mother, and sister; Stroke in her mother and sister.   ROS:  Please see the history of present illness.   All other systems are personally reviewed and negative.   Recent Labs: 11/06/2021: Hemoglobin 12.4; Platelets 151 02/16/2022: B Natriuretic Peptide 611.6 03/14/2022: BUN 43; Creatinine, Ser 2.11; Potassium 4.0; Sodium 136  Personally reviewed   Wt Readings from Last 3 Encounters:  03/30/22  54 kg (119 lb)  03/13/22 54.4 kg (120 lb)  02/16/22 54 kg (119 lb)   BP (!) 100/58   Pulse 60   Ht '5\' 2"'$  (1.575 m)   Wt 54 kg (119 lb)   SpO2 100%   BMI 21.77 kg/m   Physical Exam General:  NAD. No resp difficulty, arrived in [facility], elderly. HEENT: Normal Neck: Supple. No JVD. Carotids 2+ bilat; no bruits. No lymphadenopathy or thryomegaly appreciated. Cor: PMI nondisplaced. Regular rate & rhythm. No rubs, gallops, 2/6 HSM Lungs: Clear Abdomen: Soft, nontender, nondistended. No hepatosplenomegaly. No bruits or masses. Good bowel sounds. Extremities: No  cyanosis, clubbing, rash, 1+ BLE pre-tibial edema Neuro: Alert & oriented x 3, cranial nerves grossly intact. Moves all 4 extremities w/o difficulty. Affect pleasant.  Assessment/Plan 1. Chronic systolic=>diastolic CHF: EF 40-97% on 7/17 echo, 30-35% on 1/18 echo.  Nonischemic cardiomyopathy, possible role for tachycardia-mediated cardiomyopathy (some improvement in EF with rate control but not back to normal). ECHO 03/2017 EF 40-45%.  She had AV nodal ablation due to difficult-to-control atrial fibrillation in 3/18 with St Jude CRT-P placement. Repeat echo in 9/19 showed EF up to 60-65%.  Echo in 10/20 with EF 55-60%, moderately decreased RV systolic function with moderate RV enlargement, moderate-severe biatrial enlargement, moderate-severe TR, mild MR.  Echo in 4/22 with EF 55% with mild LVH, mild RV enlargement with mildly decreased RV systolic function, severe biatrial enlargement, mild MR, moderate TR.  Echo (5/23) with EF 50-55%, moderate RV enlargement with normal systolic function, PASP 42 mmHg, moderate MR, severe TR.  NYHA class III symptoms. She is not volume overloaded today, REDs 26%. - Continue Lasix 40 mg daily.  BMET/BNP today. - Unable to tolerate losartan at even low dose.  - Continue bisoprolol 5 mg daily.   - Continue spironolactone 25 mg qhs. - Continue dapagliflozin 10 mg daily.  2. Atrial fibrillation/flutter:  Concern  for prior tachy-mediated cardiomyopathy.  She had suspected amiodarone-induced lung toxicity so ranolazine was started.  She did not tolerate ranolazine due to nausea. Poor Tikosyn candidate with CKD.   She tolerated atrial fibrillation poorly and rate was difficult to control, so she had AV nodal ablation with St Jude CRT-P device.  She is in chronic AF.  - Continue apixaban 2.5 mg bid (age, creatinine). No bleeding issues. 3. CAD: Patent OM stent on coronary angiography in 7/17.  Had episode of atypical chest pain during the night, relieved with 1 nitro. She attributes this to gas/GERD.  - Continue statin. - No ASA with apixaban use.   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  Off amiodarone.  Follows with pulmonary.  5. CKD stage 3: BMET today.  Follow up in 3 months with Dr. [name].  Signed, Rafael Bihari, FNP  03/30/2022  [facility] [address] [facility] [place] [address] [phone] (office) [phone] (fax)

## 2022-03-30 NOTE — Patient Instructions (Addendum)
Thank you for coming in today  Labs were done today, if any labs are abnormal the clinic will call you No news is good news  Your physician recommends that you schedule a follow-up appointment in:  3 months with Dr. Mclean    Do the following things EVERYDAY: Weigh yourself in the morning before breakfast. Write it down and keep it in a log. Take your medicines as prescribed Eat low salt foods--Limit salt (sodium) to 2000 mg per day.  Stay as active as you can everyday Limit all fluids for the day to less than 2 liters  At the [facility], you and your health needs are our priority. As part of our continuing mission to provide you with exceptional heart care, we have created designated Provider Care Teams. These Care Teams include your primary Cardiologist (physician) and Advanced Practice Providers (APPs- Physician Assistants and Nurse Practitioners) who all work together to provide you with the care you need, when you need it.   You may see any of the following providers on your designated Care Team at your next follow up: Dr Daniel Bensimhon Dr Dalton McLean Dr. Aditya Sabharwal Amy Clegg, NP Brittainy Simmons, PA Jessica Milford,NP Lindsay Finch, PA Alma Diaz, NP Lauren Kemp, PharmD   Please be sure to bring in all your medications bottles to every appointment.   If you have any questions or concerns before your next appointment please send us a message through mychart or call our office at [phone].    TO LEAVE A MESSAGE FOR THE NURSE SELECT OPTION 2, PLEASE LEAVE A MESSAGE INCLUDING: YOUR NAME DATE OF BIRTH CALL BACK NUMBER REASON FOR CALL**this is important as we prioritize the call backs  YOU WILL RECEIVE A CALL BACK THE SAME DAY AS LONG AS YOU CALL BEFORE 4:00 PM  

## 2022-04-09 ENCOUNTER — Telehealth (HOSPITAL_COMMUNITY): Payer: Self-pay | Admitting: *Deleted

## 2022-04-09 NOTE — Telephone Encounter (Signed)
Called patient per Allena Katz, NP with following instructions: 1. Please take extra 40 Lasix x 3 days (40 bid x 3 days w/ extra 20 KCL x 3 days, then down to 40 daily and stop KCL).  2. Repeat BMET and BNP in 10 days.   Pt says her son noticed swelling in lower extremities and she has taken extra Lasix 20 mg yesterday and today; she has not taken any potassium. Asked her to take 40 lasix tomorrow and 40 meq potassium today and tomorrow. Repeat lab appointment scheduled. Pt verbalized understanding of same and will call [facility] Clinic if any questions or concerns; contact information confirmed.

## 2022-04-13 ENCOUNTER — Other Ambulatory Visit (HOSPITAL_COMMUNITY): Payer: PPO

## 2022-04-19 ENCOUNTER — Other Ambulatory Visit (HOSPITAL_COMMUNITY): Payer: Self-pay | Admitting: Cardiology

## 2022-04-19 ENCOUNTER — Other Ambulatory Visit (HOSPITAL_COMMUNITY): Payer: Self-pay

## 2022-04-19 ENCOUNTER — Other Ambulatory Visit (HOSPITAL_COMMUNITY): Payer: PPO

## 2022-04-19 DIAGNOSIS — L281 Prurigo nodularis: Secondary | ICD-10-CM | POA: Diagnosis not present

## 2022-04-19 DIAGNOSIS — L298 Other pruritus: Secondary | ICD-10-CM | POA: Diagnosis not present

## 2022-04-19 DIAGNOSIS — L905 Scar conditions and fibrosis of skin: Secondary | ICD-10-CM | POA: Diagnosis not present

## 2022-04-19 MED ORDER — NITROGLYCERIN 0.4 MG SL SUBL: SUBLINGUAL_TABLET | SUBLINGUAL | 1 refills | Status: DC

## 2022-04-20 ENCOUNTER — Ambulatory Visit (HOSPITAL_COMMUNITY)
Admission: RE | Admit: 2022-04-20 | Discharge: 2022-04-20 | Disposition: A | Discharge status: Home / Self Care | Payer: PPO | Source: Ambulatory Visit | Attending: Internal Medicine | Admitting: Internal Medicine

## 2022-04-20 DIAGNOSIS — I5020 Unspecified systolic (congestive) heart failure: Secondary | ICD-10-CM | POA: Insufficient documentation

## 2022-04-20 LAB — BASIC METABOLIC PANEL
Anion gap: 16 — ABNORMAL HIGH (ref 5–15)
BUN: 44 mg/dL — ABNORMAL HIGH (ref 8–23)
CO2: 23 mmol/L (ref 22–32)
Calcium: 9.7 mg/dL (ref 8.9–10.3)
Chloride: 98 mmol/L (ref 98–111)
Creatinine, Ser: 1.77 mg/dL — ABNORMAL HIGH (ref 0.44–1.00)
GFR, Estimated: 27 mL/min — ABNORMAL LOW (ref >60)
Glucose, Bld: 171 mg/dL — ABNORMAL HIGH (ref 70–99)
Potassium: 3.9 mmol/L (ref 3.5–5.1)
Sodium: 137 mmol/L (ref 135–145)

## 2022-04-20 LAB — BRAIN NATRIURETIC PEPTIDE: B Natriuretic Peptide: 925 pg/mL — ABNORMAL HIGH (ref 0.0–100.0)

## 2022-05-14 ENCOUNTER — Telehealth (HOSPITAL_COMMUNITY): Payer: Self-pay | Admitting: *Deleted

## 2022-05-14 NOTE — Telephone Encounter (Signed)
Pt left vm stating her legs and feet are so swollen she can't get her shoes on. No other complaints at this time. Pt stated she's taking all medications as presrcribed.   Routed to [employer] for advice

## 2022-05-15 NOTE — Telephone Encounter (Signed)
Left vm for pt to return my call.  

## 2022-06-01 ENCOUNTER — Ambulatory Visit (INDEPENDENT_AMBULATORY_CARE_PROVIDER_SITE_OTHER): Payer: PPO

## 2022-06-01 DIAGNOSIS — I428 Other cardiomyopathies: Secondary | ICD-10-CM

## 2022-06-02 LAB — CUP PACEART REMOTE DEVICE CHECK
Battery Remaining Longevity: 43 mo
Battery Remaining Percentage: 93 %
Battery Voltage: 2.98 V
Date Time Interrogation Session: 20231201020014
Implantable Lead Connection Status: 753985
Implantable Lead Connection Status: 753985
Implantable Lead Connection Status: 753985
Implantable Lead Implant Date: 20180329
Implantable Lead Implant Date: 20180329
Implantable Lead Implant Date: 20180329
Implantable Lead Location: 753859
Implantable Lead Location: 753860
Implantable Lead Location: 753860
Implantable Lead Model: 3830
Implantable Lead Model: 5076
Implantable Pulse Generator Implant Date: 20230530
Lead Channel Impedance Value: 290 Ohm
Lead Channel Impedance Value: 360 Ohm
Lead Channel Pacing Threshold Amplitude: 1 V
Lead Channel Pacing Threshold Amplitude: 2.5 V
Lead Channel Pacing Threshold Pulse Width: 0.5 ms
Lead Channel Pacing Threshold Pulse Width: 1 ms
Lead Channel Setting Pacing Amplitude: 2 V
Lead Channel Setting Pacing Amplitude: 3.5 V
Lead Channel Setting Pacing Pulse Width: 0.5 ms
Lead Channel Setting Pacing Pulse Width: 1 ms
Lead Channel Setting Sensing Sensitivity: 4 mV
Pulse Gen Model: 3222
Pulse Gen Serial Number: 8080981

## 2022-06-10 DIAGNOSIS — R609 Edema, unspecified: Secondary | ICD-10-CM | POA: Diagnosis not present

## 2022-06-10 DIAGNOSIS — R0902 Hypoxemia: Secondary | ICD-10-CM | POA: Diagnosis not present

## 2022-06-11 DIAGNOSIS — I071 Rheumatic tricuspid insufficiency: Secondary | ICD-10-CM | POA: Diagnosis not present

## 2022-06-11 DIAGNOSIS — E1165 Type 2 diabetes mellitus with hyperglycemia: Secondary | ICD-10-CM | POA: Diagnosis not present

## 2022-06-11 DIAGNOSIS — R7989 Other specified abnormal findings of blood chemistry: Secondary | ICD-10-CM | POA: Diagnosis not present

## 2022-06-11 DIAGNOSIS — I252 Old myocardial infarction: Secondary | ICD-10-CM | POA: Diagnosis not present

## 2022-06-11 DIAGNOSIS — E1122 Type 2 diabetes mellitus with diabetic chronic kidney disease: Secondary | ICD-10-CM | POA: Diagnosis not present

## 2022-06-11 DIAGNOSIS — I5033 Acute on chronic diastolic (congestive) heart failure: Secondary | ICD-10-CM | POA: Diagnosis not present

## 2022-06-11 DIAGNOSIS — R1310 Dysphagia, unspecified: Secondary | ICD-10-CM | POA: Diagnosis not present

## 2022-06-11 DIAGNOSIS — I088 Other rheumatic multiple valve diseases: Secondary | ICD-10-CM | POA: Diagnosis not present

## 2022-06-11 DIAGNOSIS — M7989 Other specified soft tissue disorders: Secondary | ICD-10-CM | POA: Diagnosis not present

## 2022-06-11 DIAGNOSIS — D7589 Other specified diseases of blood and blood-forming organs: Secondary | ICD-10-CM | POA: Diagnosis not present

## 2022-06-11 DIAGNOSIS — E1151 Type 2 diabetes mellitus with diabetic peripheral angiopathy without gangrene: Secondary | ICD-10-CM | POA: Diagnosis not present

## 2022-06-11 DIAGNOSIS — Z87891 Personal history of nicotine dependence: Secondary | ICD-10-CM | POA: Diagnosis not present

## 2022-06-11 DIAGNOSIS — I361 Nonrheumatic tricuspid (valve) insufficiency: Secondary | ICD-10-CM | POA: Diagnosis not present

## 2022-06-11 DIAGNOSIS — Z7901 Long term (current) use of anticoagulants: Secondary | ICD-10-CM | POA: Diagnosis not present

## 2022-06-11 DIAGNOSIS — Z950 Presence of cardiac pacemaker: Secondary | ICD-10-CM | POA: Diagnosis not present

## 2022-06-11 DIAGNOSIS — I130 Hypertensive heart and chronic kidney disease with heart failure and stage 1 through stage 4 chronic kidney disease, or unspecified chronic kidney disease: Secondary | ICD-10-CM | POA: Diagnosis not present

## 2022-06-11 DIAGNOSIS — N189 Chronic kidney disease, unspecified: Secondary | ICD-10-CM | POA: Diagnosis not present

## 2022-06-11 DIAGNOSIS — I2510 Atherosclerotic heart disease of native coronary artery without angina pectoris: Secondary | ICD-10-CM | POA: Diagnosis not present

## 2022-06-11 DIAGNOSIS — M79662 Pain in left lower leg: Secondary | ICD-10-CM | POA: Diagnosis not present

## 2022-06-11 DIAGNOSIS — I4892 Unspecified atrial flutter: Secondary | ICD-10-CM | POA: Diagnosis not present

## 2022-06-11 DIAGNOSIS — N183 Chronic kidney disease, stage 3 unspecified: Secondary | ICD-10-CM | POA: Diagnosis not present

## 2022-06-11 DIAGNOSIS — M79604 Pain in right leg: Secondary | ICD-10-CM | POA: Diagnosis not present

## 2022-06-11 DIAGNOSIS — I482 Chronic atrial fibrillation, unspecified: Secondary | ICD-10-CM | POA: Diagnosis not present

## 2022-06-11 DIAGNOSIS — N179 Acute kidney failure, unspecified: Secondary | ICD-10-CM | POA: Diagnosis not present

## 2022-06-11 DIAGNOSIS — Z7409 Other reduced mobility: Secondary | ICD-10-CM | POA: Diagnosis not present

## 2022-06-11 DIAGNOSIS — Z66 Do not resuscitate: Secondary | ICD-10-CM | POA: Diagnosis not present

## 2022-06-11 DIAGNOSIS — R0600 Dyspnea, unspecified: Secondary | ICD-10-CM | POA: Diagnosis not present

## 2022-06-11 DIAGNOSIS — R54 Age-related physical debility: Secondary | ICD-10-CM | POA: Diagnosis not present

## 2022-06-11 DIAGNOSIS — R600 Localized edema: Secondary | ICD-10-CM | POA: Diagnosis not present

## 2022-06-11 DIAGNOSIS — R778 Other specified abnormalities of plasma proteins: Secondary | ICD-10-CM | POA: Diagnosis not present

## 2022-06-11 DIAGNOSIS — I480 Paroxysmal atrial fibrillation: Secondary | ICD-10-CM | POA: Diagnosis not present

## 2022-06-11 DIAGNOSIS — I2489 Other forms of acute ischemic heart disease: Secondary | ICD-10-CM | POA: Diagnosis not present

## 2022-06-11 DIAGNOSIS — I5043 Acute on chronic combined systolic (congestive) and diastolic (congestive) heart failure: Secondary | ICD-10-CM | POA: Diagnosis not present

## 2022-06-11 DIAGNOSIS — E782 Mixed hyperlipidemia: Secondary | ICD-10-CM | POA: Diagnosis not present

## 2022-06-11 DIAGNOSIS — R0602 Shortness of breath: Secondary | ICD-10-CM | POA: Diagnosis not present

## 2022-06-11 DIAGNOSIS — I5020 Unspecified systolic (congestive) heart failure: Secondary | ICD-10-CM | POA: Diagnosis not present

## 2022-06-11 DIAGNOSIS — I340 Nonrheumatic mitral (valve) insufficiency: Secondary | ICD-10-CM | POA: Diagnosis not present

## 2022-06-11 DIAGNOSIS — M79661 Pain in right lower leg: Secondary | ICD-10-CM | POA: Diagnosis not present

## 2022-06-11 DIAGNOSIS — I50813 Acute on chronic right heart failure: Secondary | ICD-10-CM | POA: Diagnosis not present

## 2022-06-11 DIAGNOSIS — E871 Hypo-osmolality and hyponatremia: Secondary | ICD-10-CM | POA: Diagnosis not present

## 2022-06-11 DIAGNOSIS — Z955 Presence of coronary angioplasty implant and graft: Secondary | ICD-10-CM | POA: Diagnosis not present

## 2022-06-18 DIAGNOSIS — I482 Chronic atrial fibrillation, unspecified: Secondary | ICD-10-CM | POA: Diagnosis not present

## 2022-06-18 DIAGNOSIS — Z7984 Long term (current) use of oral hypoglycemic drugs: Secondary | ICD-10-CM | POA: Diagnosis not present

## 2022-06-18 DIAGNOSIS — L97811 Non-pressure chronic ulcer of other part of right lower leg limited to breakdown of skin: Secondary | ICD-10-CM | POA: Diagnosis not present

## 2022-06-18 DIAGNOSIS — Z7901 Long term (current) use of anticoagulants: Secondary | ICD-10-CM | POA: Diagnosis not present

## 2022-06-18 DIAGNOSIS — N189 Chronic kidney disease, unspecified: Secondary | ICD-10-CM | POA: Diagnosis not present

## 2022-06-18 DIAGNOSIS — I361 Nonrheumatic tricuspid (valve) insufficiency: Secondary | ICD-10-CM | POA: Diagnosis not present

## 2022-06-18 DIAGNOSIS — I5022 Chronic systolic (congestive) heart failure: Secondary | ICD-10-CM | POA: Diagnosis not present

## 2022-06-18 DIAGNOSIS — I739 Peripheral vascular disease, unspecified: Secondary | ICD-10-CM | POA: Diagnosis not present

## 2022-06-18 DIAGNOSIS — I340 Nonrheumatic mitral (valve) insufficiency: Secondary | ICD-10-CM | POA: Diagnosis not present

## 2022-06-18 DIAGNOSIS — I130 Hypertensive heart and chronic kidney disease with heart failure and stage 1 through stage 4 chronic kidney disease, or unspecified chronic kidney disease: Secondary | ICD-10-CM | POA: Diagnosis not present

## 2022-06-18 DIAGNOSIS — I87311 Chronic venous hypertension (idiopathic) with ulcer of right lower extremity: Secondary | ICD-10-CM | POA: Diagnosis not present

## 2022-06-18 DIAGNOSIS — E1122 Type 2 diabetes mellitus with diabetic chronic kidney disease: Secondary | ICD-10-CM | POA: Diagnosis not present

## 2022-06-19 DIAGNOSIS — I5022 Chronic systolic (congestive) heart failure: Secondary | ICD-10-CM | POA: Diagnosis not present

## 2022-06-19 DIAGNOSIS — I739 Peripheral vascular disease, unspecified: Secondary | ICD-10-CM | POA: Diagnosis not present

## 2022-06-19 DIAGNOSIS — I872 Venous insufficiency (chronic) (peripheral): Secondary | ICD-10-CM | POA: Diagnosis not present

## 2022-06-19 DIAGNOSIS — R600 Localized edema: Secondary | ICD-10-CM | POA: Diagnosis not present

## 2022-06-19 DIAGNOSIS — L03119 Cellulitis of unspecified part of limb: Secondary | ICD-10-CM | POA: Diagnosis not present

## 2022-06-19 NOTE — Progress Notes (Signed)
Remote pacemaker transmission.   

## 2022-06-20 ENCOUNTER — Ambulatory Visit (HOSPITAL_COMMUNITY)
Admission: RE | Admit: 2022-06-20 | Discharge: 2022-06-20 | Disposition: A | Discharge status: Home / Self Care | Payer: PPO | Source: Ambulatory Visit | Attending: Cardiology | Admitting: Cardiology

## 2022-06-20 ENCOUNTER — Encounter (HOSPITAL_COMMUNITY): Payer: Self-pay | Admitting: Cardiology

## 2022-06-20 VITALS — BP 90/50 | HR 60 | Wt 126.0 lb

## 2022-06-20 DIAGNOSIS — Z87891 Personal history of nicotine dependence: Secondary | ICD-10-CM | POA: Insufficient documentation

## 2022-06-20 DIAGNOSIS — Z8249 Family history of ischemic heart disease and other diseases of the circulatory system: Secondary | ICD-10-CM | POA: Insufficient documentation

## 2022-06-20 DIAGNOSIS — I5022 Chronic systolic (congestive) heart failure: Secondary | ICD-10-CM

## 2022-06-20 DIAGNOSIS — I4819 Other persistent atrial fibrillation: Secondary | ICD-10-CM | POA: Diagnosis not present

## 2022-06-20 DIAGNOSIS — I4892 Unspecified atrial flutter: Secondary | ICD-10-CM | POA: Insufficient documentation

## 2022-06-20 DIAGNOSIS — N1832 Chronic kidney disease, stage 3b: Secondary | ICD-10-CM | POA: Diagnosis not present

## 2022-06-20 DIAGNOSIS — I2510 Atherosclerotic heart disease of native coronary artery without angina pectoris: Secondary | ICD-10-CM | POA: Insufficient documentation

## 2022-06-20 DIAGNOSIS — I428 Other cardiomyopathies: Secondary | ICD-10-CM | POA: Insufficient documentation

## 2022-06-20 DIAGNOSIS — Z950 Presence of cardiac pacemaker: Secondary | ICD-10-CM | POA: Diagnosis not present

## 2022-06-20 DIAGNOSIS — Z7901 Long term (current) use of anticoagulants: Secondary | ICD-10-CM | POA: Insufficient documentation

## 2022-06-20 DIAGNOSIS — Z79899 Other long term (current) drug therapy: Secondary | ICD-10-CM | POA: Insufficient documentation

## 2022-06-20 MED ORDER — TORSEMIDE 20 MG PO TABS
20.0000 mg | ORAL_TABLET | Freq: Two times a day (BID) | ORAL | 3 refills | Status: DC
Start: 2022-06-20 — End: 2022-06-28

## 2022-06-20 NOTE — Patient Instructions (Signed)
STOP Lasix  START Torsemide '40mg'$  daily for 3 days, then start '20mg'$  daily.  Blood work in 7 days  Your physician recommends that you schedule a follow-up appointment in: 2 weeks   If you have any questions or concerns before your next appointment please send [place] a message through [place] or call our office at [phone].    TO LEAVE A MESSAGE FOR THE NURSE SELECT OPTION 2, PLEASE LEAVE A MESSAGE INCLUDING: YOUR NAME DATE OF BIRTH CALL BACK NUMBER REASON FOR CALL**this is important as we prioritize the call backs  YOU WILL RECEIVE A CALL BACK THE SAME DAY AS LONG AS YOU CALL BEFORE 4:00 PM  At the [facility], you and your health needs are our priority. As part of our continuing mission to provide you with exceptional heart care, we have created designated Provider Care Teams. These Care Teams include your primary Cardiologist (physician) and Advanced Practice Providers (APPs- Physician Assistants and Nurse Practitioners) who all work together to provide you with the care you need, when you need it.   You may see any of the following providers on your designated Care Team at your next follow up: Dr Glori Bickers Dr [name] Dr. Roxana Hires, NP Lyda Jester, [place] [facility] [place], [place] Forestine Na, NP Audry Riles, PharmD   Please be sure to bring in all your medications bottles to every appointment.

## 2022-06-20 NOTE — Progress Notes (Addendum)
ID:  Shannon [name], DOB January 26, 1932, MRN [id number]  Provider location: [facility] Type of Visit: Established patient   PCP:  Tisovec, Fransico Him, MD  Cardiologist:  Mertie Moores, MD HF Cardiology: Dr. [name]  Chief Complaint: HF follow up   History of Present Illness: Shannon [name] is a 88 y.o. female who has a history of paroxysmal atrial fibrillation, chronic systolic CHF, and CAD. She apparently had been in persistent atrial fibrillation since around 8/16.  It appears that she was tachycardic most of the time prior to her recent admission in 7/17.  She had been on amiodarone prior to admission but it was not keeping her in NSR.  She was admitted with acute systolic CHF.  EF had dropped to 20-25% in 7/17 from 50-55% in 10/16.  She was in atrial fibrillation with RVR.  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  Amiodarone was stopped due to suspicion for amiodarone-induced lung toxicity (she was seen by pulmonary).  She was eventually cardioverted to NSR.  Ranolazine was started to try to keep her in NSR.  HR was low in the hospital so she has not been on a beta blocker.  She was extensively diuresed and discharged to SNF.  After discharge, she developed persistent nausea.  Eventually, it was found that the nausea was likely from ranolazine, so this medication was stopped and the nausea resolved.     Patient continued to have difficult-to-control atrial fibrillation.  GFR was too low for Tikosyn.  She therefore had AV nodal ablation with St Jude CRT-P device placed in 3/18.  She developed a pocket hematoma and had pocket revision with sub-pectoral placement in 5/18.    Echo in 9/19 showed EF up to 60-65%, moderate MR, moderate TR, PASP 43 mmHg. Echo in 10/20 showed EF 55-60%, moderately decreased RV systolic function with moderate RV enlargement, moderate-severe biatrial enlargement, moderate-severe TR, mild MR.    Echo in 4/22 showed EF 55% with mild LVH, mild RV enlargement with mildly decreased RV systolic function, severe biatrial enlargement, mild MR, moderate TR.  Echo in 5/23 showed EF 50-55%, moderate RV enlargement with normal systolic function, PASP 42 mmHg, moderate MR, severe TR.   Patient was admitted to the hospital in [facility] in 12/23 with CHF, predominantly RV failure.  Echo there showed EF 45-50%, lateral hypokinesis, severe RV dilation with mildly decreased RV systolic function, severe TR.   Today she returns for HF follow up with her son. She has been off bisoprolol and spironolactone since last admission.  SBP around 90 today.  She denies lightheadedness. She is doing some walking around the house with her walker.  She is mildly short of breath but improved compared to prior to hospitalization.  No palpitations.  No falls.  No chest pain.  No orthopnea/PND. Weight is up 7 lbs compared to prior appt.   ECG (personally reviewed): Atrial fibrillation with BiV pacing   Labs (7/17): Digoxin 1.6 => 0.3, K 4.6, creatinine 0.87 => 0.76, HCT 34, BNP 265 Labs (9/17): K 5, creatinine 1.36 Labs (11/17): K 4.9, creatinine 1.29, hgb 9.2, digoxin 0.5 Labs (12/17): hgb 10.4, digoxin 0.4 Labs (1/18): K 4, creatinine 1.62 => 1.49, hgb 9.5, LDL 42, HDL 63 Labs (3/18): K 4.1, creatinine 1.44, digoxin 0.4 Labs (6/18): K 3.9, creatinine 1.7, hgb 11.5, LDL 56, HDL 50 Labs (6/19): K 4.4, creatinine 1.7, hgb 12.8, LDL 54 Labs (10/19): K 4.2, creatinine  1.46 Labs (6/21): K 4.6, creatinine 1.5, LDL 43, HDL 51 Labs (3/22): BNP 635, LFTs normal, K 3.8, creatinine 1.81 Labs (5/23): K 4.3, creatinine 1.63 => 1.67 Labs (9/23): K 4.0, creatinine 2.11 Labs (11/23): K 5.1, creatinine 1.74    PMH: 1. Atrial fibrillation: Paroxysmal.  Had been persistent since 8/16, was cardioverted to NSR during 7/17 admission.  Possible atypical atrial flutter on 12/17 ECG.  By 3/18, atrial fibrillation again.  - Nausea with  ranolazine used for atrial fibrillation suppression.  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  - AV nodal ablation 3/18 with CRT-P.  2. 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  She had been in atrial fibrillation with elevated rate possibly for months prior to 7/17 admission.  - Echo (10/16): EF 50-55%.  - Echo (7/17): EF 20-25%, mildly decreased RV systolic function, severe TR, PA systolic pressure 64 mmHg.  - LHC/RHC (7/17): Patent OM stent, no obstructive CAD.  Mean RA 13, PA 45/25, mean PCWP 16, CI 2.36.  - Echo (1/18): EF 30-35% with diffuse hypokinesis, mildly decreased RV systolic function, moderate TR and MR, PASP 53 mmHg, severe LAE.  - St Jude CRT-P placed with AV nodal ablation in 3/18.  - Echo (9/19): EF 60-65%, moderate LVH, grade 3 diastolic dysfunction, mild MR, mild AI, moderate TR, PASP 43 mmHg.  - Echo (10/20): EF 55-60%, moderately decreased RV systolic function with moderate RV enlargement, moderate-severe biatrial enlargement, moderate-severe TR, mild MR.  - Echo (4/22): EF 55% with mild LVH, mild RV enlargement with mildly decreased RV systolic function, severe biatrial enlargement, mild MR, moderate TR.  - Echo (5/23): EF 50-55%, moderate RV enlargement with normal systolic function, PASP 42 mmHg, moderate MR, severe TR.  - Echo (12/23): EF 45-50%, lateral hypokinesis, severe RV dilation with mildly decreased RV systolic function, severe TR.  3. CAD: DES to OM in 2007.  LHC (7/17) with patent stent and no new obstructive disease.  4. Type II diabetes 5. Hyperlipidemia 6. TIA 7. Possible amiodarone lung toxicity: Chest CT (7/17) with bronchiectasis/fibrosis, patchy bilateral densities.  8. CKD stage 3  Past Surgical History:  Procedure Laterality Date   ATRIAL FLUTTER ABLATION N/A 10/09/2012   Procedure: ATRIAL FLUTTER ABLATION;  Surgeon: Thompson Grayer, MD;  Location: [facility] CATH LAB;  Service: Cardiovascular;  Laterality: N/A;    AV NODE ABLATION N/A 09/27/2016   Procedure: AV Node Ablation;  Surgeon: Thompson Grayer, MD;  Location: [place] CV LAB;  Service: Cardiovascular;  Laterality: N/A;   BIV PACEMAKER INSERTION CRT-P N/A 09/27/2016   Procedure: BiV Pacemaker Insertion CRT-P;  Surgeon: Thompson Grayer, MD;  Location: [place] [facility] LAB;  Service: Cardiovascular;  Laterality: N/A;   CARDIAC CATHETERIZATION N/A 01/06/2016   Procedure: Right/Left Heart Cath and Coronary Angiography;  Surgeon: Belva Crome, MD;  Location: [place] CV LAB;  Service: Cardiovascular;  Laterality: N/A;   CARDIOVERSION N/A 01/11/2016   Procedure: CARDIOVERSION;  Surgeon: Larey Dresser, MD;  Location: [place];  Service: Cardiovascular;  Laterality: N/A;   cataracts     COLONOSCOPY N/A 10/13/2015   Procedure: COLONOSCOPY;  Surgeon: Manus Gunning, MD;  Location: [place];  Service: Gastroenterology;  Laterality: N/A;   CORONARY ANGIOPLASTY     Status post PTCA and stenting of the first obtuse marginal-05/13/2006. We placed a 3.0 x 20 mm Taxus stent. It was post dilated using a 3.25 mm noncompliant balloon up to 14 atmospheres   IMPLANT POCKET INCISION & DRAINAGE N/A 11/02/2016  Procedure: Implant Pocket Incision & Drainage;  Surgeon: Evans Lance, MD;  Location: [place] CV LAB;  Service: Cardiovascular;  Laterality: N/A;   PPM GENERATOR CHANGEOUT N/A 11/28/2021   Procedure: PPM GENERATOR CHANGEOUT;  Surgeon: Evans Lance, MD;  Location: [place] [facility] LAB;  Service: Cardiovascular;  Laterality: N/A;   TONSILLECTOMY     Current Outpatient Medications  Medication Sig Dispense Refill   ACCU-CHEK FASTCLIX LANCETS MISC USE TO CHECK BLOOD SUGAR TWICE DAILY  5   ACCU-CHEK SMARTVIEW test strip      apixaban (ELIQUIS) 2.5 MG TABS tablet Take 1 tablet (2.5 mg total) by mouth 2 (two) times daily. Resume 6/4 with the evening dose 60 tablet 11   atorvastatin (LIPITOR) 20 MG tablet TAKE 1 TABLET EVERY DAY 30 tablet 11    dapagliflozin propanediol (FARXIGA) 10 MG TABS tablet Take 1 tablet (10 mg total) by mouth daily before breakfast. 30 tablet 11   Homeopathic Products (ARNICARE ARTHRITIS) TBDP Take 1 tablet by mouth daily as needed (mouth pain).     loratadine (CLARITIN) 10 MG tablet Take 10 mg by mouth daily as needed for allergies.     nitroGLYCERIN (NITROSTAT) 0.4 MG SL tablet Take 1 tablet as needed. 75 tablet 1   OVER THE COUNTER MEDICATION Apply 1 application. topically daily as needed (arthritis). Artrosilium topical cream     OVER THE COUNTER MEDICATION Apply 1 application. topically daily as needed (leg pain). magnilife db foot cream     torsemide (DEMADEX) 20 MG tablet Take 1 tablet (20 mg total) by mouth 2 (two) times daily. 180 tablet 3   No current facility-administered medications for this encounter.    Allergies:   Amiodarone, Ranexa [ranolazine], Other, Demerol, Erythromycin, Pecan nut (diagnostic), Penicillins, Percocet [oxycodone-acetaminophen], and Tomato   Social History:  The patient  reports that she quit smoking about 59 years ago. Her smoking use included cigarettes. She smoked an average of 1 pack per day. She has never used smokeless tobacco. She reports that she does not drink alcohol and does not use drugs.   Family History:  The patient's family history includes Alzheimer's disease in her sister; Coronary artery disease in her sister; Heart attack in her brother, father, mother, and sister; Stroke in her mother and sister.   ROS:  Please see the history of present illness.   All other systems are personally reviewed and negative.   Recent Labs: 11/06/2021: Hemoglobin 12.4; Platelets 151 04/20/2022: B Natriuretic Peptide 925.0; BUN 44; Creatinine, Ser 1.77; Potassium 3.9; Sodium 137  Personally reviewed   Wt Readings from Last 3 Encounters:  06/20/22 57.2 kg (126 lb)  03/30/22 54 kg (119 lb)  03/13/22 54.4 kg (120 lb)   BP (!) 90/50   Pulse 60   Wt 57.2 kg (126 lb)   SpO2  99%   BMI 23.05 kg/m  General: NAD Neck: JVP 10 cm with HJR, no thyromegaly or thyroid nodule.  Lungs: Clear to auscultation bilaterally with normal respiratory effort. CV: Nondisplaced PMI.  Heart regular S1/S2, no S3/S4, 2/6 HSM LLSB.  1+ edema to knees.  No carotid bruit.  Normal pedal pulses.  Abdomen: Soft, nontender, no hepatosplenomegaly, no distention.  Skin: Intact without lesions or rashes.  Neurologic: Alert and oriented x 3.  Psych: Normal affect. Extremities: No clubbing or cyanosis.  HEENT: Normal.   Assessment/Plan 1. Chronic systolic=>diastolic CHF: EF 40-34% on 7/17 echo, 30-35% on 1/18 echo.  Nonischemic cardiomyopathy, possible role for tachycardia-mediated cardiomyopathy (some  improvement in EF with rate control but not back to normal). ECHO 03/2017 EF 40-45%.  She had AV nodal ablation due to difficult-to-control atrial fibrillation in 3/18 with St Jude CRT-P placement. Repeat echo in 9/19 showed EF up to 60-65%.  Echo in 10/20 with EF 55-60%, moderately decreased RV systolic function with moderate RV enlargement, moderate-severe biatrial enlargement, moderate-severe TR, mild MR.  Echo in 4/22 with EF 55% with mild LVH, mild RV enlargement with mildly decreased RV systolic function, severe biatrial enlargement, mild MR, moderate TR.  Echo (5/23) with EF 50-55%, moderate RV enlargement with normal systolic function, PASP 42 mmHg, moderate MR, severe TR.  Echo in 12/23 with EF 45-50%, lateral hypokinesis, severe RV dilation with mildly decreased RV systolic function, severe TR.  She was recently discharged from the hospital at [facility] for a CHF exacerbation.  At this point, she appears to have predominantly RV failure with severe TR.  This is complicated by CKD stage 3b. NYHA class III symptoms, she is volume overloaded on exam today with weight up. - Stop lasix, start torsemide 40 mg daily x 3 days then 20 mg daily after that. BMET/BNP in 1 week.  - Unable to tolerate losartan  at even low dose.  - Hold off on bisoprolol with volume overload and low BP.    - Hopefully can restart spironolactone in the future but most recent BMET showed mildly elevated K and creatinine up to 1.74.  - Continue dapagliflozin 10 mg daily.  2. Atrial fibrillation/flutter:  Concern for prior tachy-mediated cardiomyopathy.  She had suspected amiodarone-induced lung toxicity so ranolazine was started.  She did not tolerate ranolazine due to nausea. Poor Tikosyn candidate with CKD.   She tolerated atrial fibrillation poorly and rate was difficult to control, so she had AV nodal ablation with St Jude CRT-P device.  She is in chronic AF.  - Continue apixaban 2.5 mg bid (age, creatinine). No bleeding issues. 3. CAD: Patent OM stent on coronary angiography in 7/17.  No chest pain. .  - Continue statin. - No ASA with apixaban use.   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  Off amiodarone.  Follows with pulmonary.  5. CKD stage 3: Recent BMET with creatinine 1.74.   Follow up in 2 wks with APP.  I am concerned that her combination of RV failure and renal failure is going to be difficult to manage.   Signed, [name], MD  06/20/2022  [facility] [address] [facility] and [place] [address] [phone] (office) [phone] (fax)

## 2022-06-26 DIAGNOSIS — E1140 Type 2 diabetes mellitus with diabetic neuropathy, unspecified: Secondary | ICD-10-CM | POA: Diagnosis not present

## 2022-06-26 DIAGNOSIS — I209 Angina pectoris, unspecified: Secondary | ICD-10-CM | POA: Diagnosis not present

## 2022-06-26 DIAGNOSIS — E1151 Type 2 diabetes mellitus with diabetic peripheral angiopathy without gangrene: Secondary | ICD-10-CM | POA: Diagnosis not present

## 2022-06-26 DIAGNOSIS — I482 Chronic atrial fibrillation, unspecified: Secondary | ICD-10-CM | POA: Diagnosis not present

## 2022-06-26 DIAGNOSIS — N184 Chronic kidney disease, stage 4 (severe): Secondary | ICD-10-CM | POA: Diagnosis not present

## 2022-06-26 DIAGNOSIS — I1310 Hypertensive heart and chronic kidney disease without heart failure, with stage 1 through stage 4 chronic kidney disease, or unspecified chronic kidney disease: Secondary | ICD-10-CM | POA: Diagnosis not present

## 2022-06-26 DIAGNOSIS — I739 Peripheral vascular disease, unspecified: Secondary | ICD-10-CM | POA: Diagnosis not present

## 2022-06-26 DIAGNOSIS — D638 Anemia in other chronic diseases classified elsewhere: Secondary | ICD-10-CM | POA: Diagnosis not present

## 2022-06-26 DIAGNOSIS — I2510 Atherosclerotic heart disease of native coronary artery without angina pectoris: Secondary | ICD-10-CM | POA: Diagnosis not present

## 2022-06-26 DIAGNOSIS — I87319 Chronic venous hypertension (idiopathic) with ulcer of unspecified lower extremity: Secondary | ICD-10-CM | POA: Diagnosis not present

## 2022-06-26 DIAGNOSIS — Z7901 Long term (current) use of anticoagulants: Secondary | ICD-10-CM | POA: Diagnosis not present

## 2022-06-26 DIAGNOSIS — I509 Heart failure, unspecified: Secondary | ICD-10-CM | POA: Diagnosis not present

## 2022-06-27 ENCOUNTER — Ambulatory Visit (HOSPITAL_COMMUNITY)
Admission: RE | Admit: 2022-06-27 | Discharge: 2022-06-27 | Disposition: A | Discharge status: Home / Self Care | Payer: PPO | Source: Ambulatory Visit | Attending: Internal Medicine | Admitting: Internal Medicine

## 2022-06-27 DIAGNOSIS — I5022 Chronic systolic (congestive) heart failure: Secondary | ICD-10-CM | POA: Insufficient documentation

## 2022-06-27 LAB — BASIC METABOLIC PANEL
Anion gap: 10 (ref 5–15)
BUN: 42 mg/dL — ABNORMAL HIGH (ref 8–23)
CO2: 23 mmol/L (ref 22–32)
Calcium: 9 mg/dL (ref 8.9–10.3)
Chloride: 100 mmol/L (ref 98–111)
Creatinine, Ser: 2.54 mg/dL — ABNORMAL HIGH (ref 0.44–1.00)
GFR, Estimated: 18 mL/min — ABNORMAL LOW (ref >60)
Glucose, Bld: 134 mg/dL — ABNORMAL HIGH (ref 70–99)
Potassium: 4.7 mmol/L (ref 3.5–5.1)
Sodium: 133 mmol/L — ABNORMAL LOW (ref 135–145)

## 2022-06-28 ENCOUNTER — Telehealth (HOSPITAL_COMMUNITY): Payer: Self-pay | Admitting: Cardiology

## 2022-06-28 MED ORDER — TORSEMIDE 20 MG PO TABS: 20.0000 mg | ORAL_TABLET | ORAL | 3 refills | Status: DC

## 2022-06-28 NOTE — Telephone Encounter (Signed)
Patient called.  Patient aware. Repeat labs at fu 1/4

## 2022-06-28 NOTE — Telephone Encounter (Signed)
-----   Message from Larey Dresser, MD sent at 06/27/2022  8:34 PM EST ----- Decrease torsemide to 20 mg every other day. BMET 1 week.

## 2022-07-03 DIAGNOSIS — L97811 Non-pressure chronic ulcer of other part of right lower leg limited to breakdown of skin: Secondary | ICD-10-CM | POA: Diagnosis not present

## 2022-07-03 DIAGNOSIS — I130 Hypertensive heart and chronic kidney disease with heart failure and stage 1 through stage 4 chronic kidney disease, or unspecified chronic kidney disease: Secondary | ICD-10-CM | POA: Diagnosis not present

## 2022-07-03 DIAGNOSIS — Z7984 Long term (current) use of oral hypoglycemic drugs: Secondary | ICD-10-CM | POA: Diagnosis not present

## 2022-07-03 DIAGNOSIS — E1122 Type 2 diabetes mellitus with diabetic chronic kidney disease: Secondary | ICD-10-CM | POA: Diagnosis not present

## 2022-07-03 DIAGNOSIS — I87311 Chronic venous hypertension (idiopathic) with ulcer of right lower extremity: Secondary | ICD-10-CM | POA: Diagnosis not present

## 2022-07-03 DIAGNOSIS — I340 Nonrheumatic mitral (valve) insufficiency: Secondary | ICD-10-CM | POA: Diagnosis not present

## 2022-07-03 DIAGNOSIS — I361 Nonrheumatic tricuspid (valve) insufficiency: Secondary | ICD-10-CM | POA: Diagnosis not present

## 2022-07-03 DIAGNOSIS — I5022 Chronic systolic (congestive) heart failure: Secondary | ICD-10-CM | POA: Diagnosis not present

## 2022-07-03 DIAGNOSIS — N189 Chronic kidney disease, unspecified: Secondary | ICD-10-CM | POA: Diagnosis not present

## 2022-07-03 DIAGNOSIS — I739 Peripheral vascular disease, unspecified: Secondary | ICD-10-CM | POA: Diagnosis not present

## 2022-07-05 ENCOUNTER — Encounter (HOSPITAL_COMMUNITY): Payer: Self-pay

## 2022-07-05 ENCOUNTER — Ambulatory Visit (HOSPITAL_COMMUNITY)
Admission: RE | Admit: 2022-07-05 | Discharge: 2022-07-05 | Disposition: A | Discharge status: Home / Self Care | Payer: PPO | Source: Ambulatory Visit | Attending: Family Medicine | Admitting: Family Medicine

## 2022-07-05 VITALS — BP 120/80 | HR 60 | Wt 125.0 lb

## 2022-07-05 DIAGNOSIS — I2510 Atherosclerotic heart disease of native coronary artery without angina pectoris: Secondary | ICD-10-CM | POA: Insufficient documentation

## 2022-07-05 DIAGNOSIS — I5020 Unspecified systolic (congestive) heart failure: Secondary | ICD-10-CM | POA: Diagnosis not present

## 2022-07-05 DIAGNOSIS — E8770 Fluid overload, unspecified: Secondary | ICD-10-CM | POA: Insufficient documentation

## 2022-07-05 DIAGNOSIS — I428 Other cardiomyopathies: Secondary | ICD-10-CM | POA: Diagnosis not present

## 2022-07-05 DIAGNOSIS — I480 Paroxysmal atrial fibrillation: Secondary | ICD-10-CM | POA: Insufficient documentation

## 2022-07-05 DIAGNOSIS — Z79899 Other long term (current) drug therapy: Secondary | ICD-10-CM | POA: Diagnosis not present

## 2022-07-05 DIAGNOSIS — I130 Hypertensive heart and chronic kidney disease with heart failure and stage 1 through stage 4 chronic kidney disease, or unspecified chronic kidney disease: Secondary | ICD-10-CM | POA: Diagnosis not present

## 2022-07-05 DIAGNOSIS — Z7901 Long term (current) use of anticoagulants: Secondary | ICD-10-CM | POA: Diagnosis not present

## 2022-07-05 DIAGNOSIS — N183 Chronic kidney disease, stage 3 unspecified: Secondary | ICD-10-CM | POA: Diagnosis not present

## 2022-07-05 DIAGNOSIS — E1122 Type 2 diabetes mellitus with diabetic chronic kidney disease: Secondary | ICD-10-CM | POA: Diagnosis not present

## 2022-07-05 DIAGNOSIS — Z87891 Personal history of nicotine dependence: Secondary | ICD-10-CM | POA: Diagnosis not present

## 2022-07-05 DIAGNOSIS — I4819 Other persistent atrial fibrillation: Secondary | ICD-10-CM

## 2022-07-05 DIAGNOSIS — I4892 Unspecified atrial flutter: Secondary | ICD-10-CM | POA: Insufficient documentation

## 2022-07-05 DIAGNOSIS — Z823 Family history of stroke: Secondary | ICD-10-CM | POA: Diagnosis not present

## 2022-07-05 DIAGNOSIS — I5042 Chronic combined systolic (congestive) and diastolic (congestive) heart failure: Secondary | ICD-10-CM | POA: Diagnosis not present

## 2022-07-05 DIAGNOSIS — N1832 Chronic kidney disease, stage 3b: Secondary | ICD-10-CM | POA: Diagnosis not present

## 2022-07-05 DIAGNOSIS — I081 Rheumatic disorders of both mitral and tricuspid valves: Secondary | ICD-10-CM | POA: Diagnosis not present

## 2022-07-05 DIAGNOSIS — E785 Hyperlipidemia, unspecified: Secondary | ICD-10-CM | POA: Insufficient documentation

## 2022-07-05 DIAGNOSIS — Z8249 Family history of ischemic heart disease and other diseases of the circulatory system: Secondary | ICD-10-CM | POA: Insufficient documentation

## 2022-07-05 DIAGNOSIS — Z8673 Personal history of transient ischemic attack (TIA), and cerebral infarction without residual deficits: Secondary | ICD-10-CM | POA: Insufficient documentation

## 2022-07-05 LAB — BASIC METABOLIC PANEL
Anion gap: 10 (ref 5–15)
BUN: 30 mg/dL — ABNORMAL HIGH (ref 8–23)
CO2: 22 mmol/L (ref 22–32)
Calcium: 9.1 mg/dL (ref 8.9–10.3)
Chloride: 98 mmol/L (ref 98–111)
Creatinine, Ser: 1.68 mg/dL — ABNORMAL HIGH (ref 0.44–1.00)
GFR, Estimated: 29 mL/min — ABNORMAL LOW (ref >60)
Glucose, Bld: 118 mg/dL — ABNORMAL HIGH (ref 70–99)
Potassium: 3.3 mmol/L — ABNORMAL LOW (ref 3.5–5.1)
Sodium: 130 mmol/L — ABNORMAL LOW (ref 135–145)

## 2022-07-05 LAB — BRAIN NATRIURETIC PEPTIDE: B Natriuretic Peptide: 703.8 pg/mL — ABNORMAL HIGH (ref 0.0–100.0)

## 2022-07-05 NOTE — Patient Instructions (Signed)
Medication Changes:  Take Torsemide 20 mg DAILY FOR 3 DAYS, then back to 20 mg every other day   Lab Work:  Labs done today, your results will be available in MyChart, we will contact you for abnormal readings.  Testing/Procedures:  none  Referrals:  none  Special Instructions // Education:  Do the following things EVERYDAY: Weigh yourself in the morning before breakfast. Write it down and keep it in a log. Take your medicines as prescribed Eat low salt foods--Limit salt (sodium) to 2000 mg per day.  Stay as active as you can everyday Limit all fluids for the day to less than 2 liters  We have contacted home health about wrapping your legs  Follow-Up in: 8 weeks with Dr [name]  At the [facility], you and your health needs are our priority. We have a designated team specialized in the treatment of Heart Failure. This Care Team includes your primary Heart Failure Specialized Cardiologist (physician), Advanced Practice Providers (APPs- Physician Assistants and Nurse Practitioners), and Pharmacist who all work together to provide you with the care you need, when you need it.   You may see any of the following providers on your designated Care Team at your next follow up:  Dr. Glori Bickers Dr. [name] Dr. Roxana Hires, NP Lyda Jester, [place] [facility] [place], [place] Forestine Na, NP Audry Riles, PharmD   Please be sure to bring in all your medications bottles to every appointment.   Need to Contact us:  If you have any questions or concerns before your next appointment please send [place] a message through [place] or call our office at [phone].    TO LEAVE A MESSAGE FOR THE NURSE SELECT OPTION 2, PLEASE LEAVE A MESSAGE INCLUDING: YOUR NAME DATE OF BIRTH CALL BACK NUMBER REASON FOR CALL**this is important as we prioritize the call backs  YOU WILL RECEIVE A CALL BACK THE SAME DAY AS LONG AS YOU CALL BEFORE 4:00  PM

## 2022-07-05 NOTE — Progress Notes (Signed)
Order for [employer] faxed to [facility] at [phone]

## 2022-07-05 NOTE — Progress Notes (Addendum)
ID:  GINNIE MARICH, DOB [birth year], MRN [id number]  Provider location: [place] [facility] Type of Visit: Established patient   PCP:  Tisovec, Fransico Him, MD  Cardiologist:  Mertie Moores, MD HF Cardiology: Dr. [name]  Chief Complaint: HF follow up   History of Present Illness: Shannon [name] is a 89 y.o. female who has a history of paroxysmal atrial fibrillation, chronic systolic CHF, and CAD. She apparently had been in persistent atrial fibrillation since around 8/16.  It appears that she was tachycardic most of the time prior to her recent admission in 7/17.  She had been on amiodarone prior to admission but it was not keeping her in NSR.  She was admitted with acute systolic CHF.  EF had dropped to 20-25% in 7/17 from 50-55% in 10/16.  She was in atrial fibrillation with RVR.  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  Amiodarone was stopped due to suspicion for amiodarone-induced lung toxicity (she was seen by pulmonary).  She was eventually cardioverted to NSR.  Ranolazine was started to try to keep her in NSR.  HR was low in the hospital so she has not been on a beta blocker.  She was extensively diuresed and discharged to SNF.  After discharge, she developed persistent nausea.  Eventually, it was found that the nausea was likely from ranolazine, so this medication was stopped and the nausea resolved.     Patient continued to have difficult-to-control atrial fibrillation.  GFR was too low for Tikosyn.  She therefore had AV nodal ablation with St Jude CRT-P device placed in 3/18.  She developed a pocket hematoma and had pocket revision with sub-pectoral placement in 5/18.    Echo in 9/19 showed EF up to 60-65%, moderate MR, moderate TR, PASP 43 mmHg. Echo in 10/20 showed EF 55-60%, moderately decreased RV systolic function with moderate RV enlargement, moderate-severe biatrial enlargement, moderate-severe TR, mild MR.    Echo in 4/22 showed EF 55% with mild LVH, mild RV enlargement with mildly decreased RV systolic function, severe biatrial enlargement, mild MR, moderate TR.  Echo in 5/23 showed EF 50-55%, moderate RV enlargement with normal systolic function, PASP 42 mmHg, moderate MR, severe TR.   Patient was admitted to the hospital in [facility] in 12/23 with CHF, predominantly RV failure.  Echo there showed EF 45-50%, lateral hypokinesis, severe RV dilation with mildly decreased RV systolic function, severe TR.   Follow up 12/23, NYHA III and volume up. Lasix stopped and torsemide started.   Today she returns for HF follow up with her son. Overall feeling fine. "This is the best I have felt in a long time." Working with PT twice a week and walking with her walker > 150 feet without SOB. Legs are swelling. Denies palpitations, abnormal bleeding, CP, dizziness, or PND/Orthopnea. Appetite ok. No fever or chills. Weight at home 125 pounds. Taking all medications.   ECG (personally reviewed): none ordered today.   Labs (7/17): Digoxin 1.6 => 0.3, K 4.6, creatinine 0.87 => 0.76, HCT 34, BNP 265 Labs (9/17): K 5, creatinine 1.36 Labs (11/17): K 4.9, creatinine 1.29, hgb 9.2, digoxin 0.5 Labs (12/17): hgb 10.4, digoxin 0.4 Labs (1/18): K 4, creatinine 1.62 => 1.49, hgb 9.5, LDL 42, HDL 63 Labs (3/18): K 4.1, creatinine 1.44, digoxin 0.4 Labs (6/18): K 3.9, creatinine 1.7, hgb 11.5, LDL 56, HDL 50 Labs (6/19): K 4.4, creatinine 1.7, hgb 12.8, LDL 54 Labs (10/19): K  4.2, creatinine 1.46 Labs (6/21): K 4.6, creatinine 1.5, LDL 43, HDL 51 Labs (3/22): BNP 635, LFTs normal, K 3.8, creatinine 1.81 Labs (5/23): K 4.3, creatinine 1.63 => 1.67 Labs (9/23): K 4.0, creatinine 2.11 Labs (11/23): K 5.1, creatinine 1.74 Labs (12/23): K 4.7, creatinine 2.54    PMH: 1. Atrial fibrillation: Paroxysmal.  Had been persistent since 8/16, was cardioverted to NSR during 7/17 admission.  Possible atypical atrial flutter on  12/17 ECG.  By 3/18, atrial fibrillation again.  - Nausea with ranolazine used for atrial fibrillation suppression.  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  - AV nodal ablation 3/18 with CRT-P.  2. 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  She had been in atrial fibrillation with elevated rate possibly for months prior to 7/17 admission.  - Echo (10/16): EF 50-55%.  - Echo (7/17): EF 20-25%, mildly decreased RV systolic function, severe TR, PA systolic pressure 64 mmHg.  - LHC/RHC (7/17): Patent OM stent, no obstructive CAD.  Mean RA 13, PA 45/25, mean PCWP 16, CI 2.36.  - Echo (1/18): EF 30-35% with diffuse hypokinesis, mildly decreased RV systolic function, moderate TR and MR, PASP 53 mmHg, severe LAE.  - St Jude CRT-P placed with AV nodal ablation in 3/18.  - Echo (9/19): EF 60-65%, moderate LVH, grade 3 diastolic dysfunction, mild MR, mild AI, moderate TR, PASP 43 mmHg.  - Echo (10/20): EF 55-60%, moderately decreased RV systolic function with moderate RV enlargement, moderate-severe biatrial enlargement, moderate-severe TR, mild MR.  - Echo (4/22): EF 55% with mild LVH, mild RV enlargement with mildly decreased RV systolic function, severe biatrial enlargement, mild MR, moderate TR.  - Echo (5/23): EF 50-55%, moderate RV enlargement with normal systolic function, PASP 42 mmHg, moderate MR, severe TR.  - Echo (12/23): EF 45-50%, lateral hypokinesis, severe RV dilation with mildly decreased RV systolic function, severe TR.  3. CAD: DES to OM in 2007.  LHC (7/17) with patent stent and no new obstructive disease.  4. Type II diabetes 5. Hyperlipidemia 6. TIA 7. Possible amiodarone lung toxicity: Chest CT (7/17) with bronchiectasis/fibrosis, patchy bilateral densities.  8. CKD stage 3  Past Surgical History:  Procedure Laterality Date   ATRIAL FLUTTER ABLATION N/A 10/09/2012   Procedure: ATRIAL FLUTTER ABLATION;  Surgeon: Thompson Grayer, MD;   Location: [facility] CATH LAB;  Service: Cardiovascular;  Laterality: N/A;   AV NODE ABLATION N/A 09/27/2016   Procedure: AV Node Ablation;  Surgeon: Thompson Grayer, MD;  Location: [place] CV LAB;  Service: Cardiovascular;  Laterality: N/A;   BIV PACEMAKER INSERTION CRT-P N/A 09/27/2016   Procedure: BiV Pacemaker Insertion CRT-P;  Surgeon: Thompson Grayer, MD;  Location: [place] CV LAB;  Service: Cardiovascular;  Laterality: N/A;   CARDIAC CATHETERIZATION N/A 01/06/2016   Procedure: Right/Left Heart Cath and Coronary Angiography;  Surgeon: Belva Crome, MD;  Location: [place] CV LAB;  Service: Cardiovascular;  Laterality: N/A;   CARDIOVERSION N/A 01/11/2016   Procedure: CARDIOVERSION;  Surgeon: Larey Dresser, MD;  Location: [place];  Service: Cardiovascular;  Laterality: N/A;   cataracts     COLONOSCOPY N/A 10/13/2015   Procedure: COLONOSCOPY;  Surgeon: Manus Gunning, MD;  Location: [place];  Service: Gastroenterology;  Laterality: N/A;   CORONARY ANGIOPLASTY     Status post PTCA and stenting of the first obtuse marginal-05/13/2006. We placed a 3.0 x 20 mm Taxus stent. It was post dilated using a 3.25 mm noncompliant balloon up to 14 atmospheres   IMPLANT  POCKET INCISION & DRAINAGE N/A 11/02/2016   Procedure: Implant Pocket Incision & Drainage;  Surgeon: Evans Lance, MD;  Location: [place] CV LAB;  Service: Cardiovascular;  Laterality: N/A;   PPM GENERATOR CHANGEOUT N/A 11/28/2021   Procedure: PPM GENERATOR CHANGEOUT;  Surgeon: Evans Lance, MD;  Location: [place] CV LAB;  Service: Cardiovascular;  Laterality: N/A;   TONSILLECTOMY     Current Outpatient Medications  Medication Sig Dispense Refill   ACCU-CHEK FASTCLIX LANCETS MISC USE TO CHECK BLOOD SUGAR TWICE DAILY  5   ACCU-CHEK SMARTVIEW test strip      apixaban (ELIQUIS) 2.5 MG TABS tablet Take 1 tablet (2.5 mg total) by mouth 2 (two) times daily. Resume 6/4 with the evening dose 60 tablet 11   atorvastatin  (LIPITOR) 20 MG tablet TAKE 1 TABLET EVERY DAY 30 tablet 11   dapagliflozin propanediol (FARXIGA) 10 MG TABS tablet Take 1 tablet (10 mg total) by mouth daily before breakfast. 30 tablet 11   Homeopathic Products (ARNICARE ARTHRITIS) TBDP Take 1 tablet by mouth daily as needed (mouth pain).     loratadine (CLARITIN) 10 MG tablet Take 10 mg by mouth daily as needed for allergies.     nitroGLYCERIN (NITROSTAT) 0.4 MG SL tablet Take 1 tablet as needed. 75 tablet 1   OVER THE COUNTER MEDICATION Apply 1 application. topically daily as needed (arthritis). Artrosilium topical cream     OVER THE COUNTER MEDICATION Apply 1 application. topically daily as needed (leg pain). magnilife db foot cream     torsemide (DEMADEX) 20 MG tablet Take 1 tablet (20 mg total) by mouth every other day. 90 tablet 3   No current facility-administered medications for this encounter.    Allergies:   Amiodarone, Ranexa [ranolazine], Other, Demerol, Erythromycin, Pecan nut (diagnostic), Penicillins, Percocet [oxycodone-acetaminophen], and Tomato   Social History:  The patient  reports that she quit smoking about 59 years ago. Her smoking use included cigarettes. She smoked an average of 1 pack per day. She has never used smokeless tobacco. She reports that she does not drink alcohol and does not use drugs.   Family History:  The patient's family history includes Alzheimer's disease in her sister; Coronary artery disease in her sister; Heart attack in her brother, father, mother, and sister; Stroke in her mother and sister.   ROS:  Please see the history of present illness.   All other systems are personally reviewed and negative.   Recent Labs: 11/06/2021: Hemoglobin 12.4; Platelets 151 04/20/2022: B Natriuretic Peptide 925.0 06/27/2022: BUN 42; Creatinine, Ser 2.54; Potassium 4.7; Sodium 133  Personally reviewed   Wt Readings from Last 3 Encounters:  07/05/22 56.7 kg (125 lb)  06/20/22 57.2 kg (126 lb)  03/30/22 54 kg  (119 lb)   BP 120/80   Pulse 60   Wt 56.7 kg (125 lb)   SpO2 97%   BMI 22.86 kg/m  Physical Exam General:  NAD. No resp difficulty, arrived in [facility], elderly HEENT: Normal Neck: Supple. No JVD, + v waves. Carotids 2+ bilat; no bruits. No lymphadenopathy or thryomegaly appreciated. Cor: PMI nondisplaced. Regular rate & rhythm. No rubs, gallops, 2/6 HSM LLSB Lungs: Clear Abdomen: Soft, nontender, nondistended. No hepatosplenomegaly. No bruits or masses. Good bowel sounds. Extremities: No cyanosis, clubbing, rash, 2+ BLE edema to knees with weeping Neuro: Alert & oriented x 3, cranial nerves grossly intact. Moves all 4 extremities w/o difficulty. Affect pleasant.  Assessment/Plan 1. Chronic systolic=>diastolic CHF: EF 93-26% on 7/17 echo,  30-35% on 1/18 echo.  Nonischemic cardiomyopathy, possible role for tachycardia-mediated cardiomyopathy (some improvement in EF with rate control but not back to normal). ECHO 03/2017 EF 40-45%.  She had AV nodal ablation due to difficult-to-control atrial fibrillation in 3/18 with St Jude CRT-P placement. Repeat echo in 9/19 showed EF up to 60-65%.  Echo in 10/20 with EF 55-60%, moderately decreased RV systolic function with moderate RV enlargement, moderate-severe biatrial enlargement, moderate-severe TR, mild MR.  Echo in 4/22 with EF 55% with mild LVH, mild RV enlargement with mildly decreased RV systolic function, severe biatrial enlargement, mild MR, moderate TR.  Echo (5/23) with EF 50-55%, moderate RV enlargement with normal systolic function, PASP 42 mmHg, moderate MR, severe TR.  Echo in 12/23 with EF 45-50%, lateral hypokinesis, severe RV dilation with mildly decreased RV systolic function, severe TR.  She was recently discharged from the hospital at [facility] for a CHF exacerbation.  At this point, she appears to have predominantly RV failure with severe TR.  This is complicated by CKD stage 3b. Improved NYHA class II-early III symptoms, she appears at  least mildly volume overloaded. - Place Unna boots, she has HH with [facility]. - Increase torsemide to 20 mg daily x 3 days, then back to 20 mg qod. BMET and BNP today. - If renal function stable, plan to restart spiro 12.5 mg daily. - Unable to tolerate losartan at even low dose.  - Hold off on bisoprolol with volume overload. - Continue dapagliflozin 10 mg daily.  2. Atrial fibrillation/flutter:  Concern for prior tachy-mediated cardiomyopathy.  She had suspected amiodarone-induced lung toxicity so ranolazine was started.  She did not tolerate ranolazine due to nausea. Poor Tikosyn candidate with CKD.   She tolerated atrial fibrillation poorly and rate was difficult to control, so she had AV nodal ablation with St Jude CRT-P device.  She is in chronic AF.  - Continue apixaban 2.5 mg bid (age, creatinine). No bleeding issues. 3. CAD: Patent OM stent on coronary angiography in 7/17.  No chest pain.  - Continue statin. - No ASA with apixaban use.   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  Off amiodarone.  Follows with pulmonary.  5. CKD stage 3: Creatinine has been recently elevated, most recent at 2.54. - BMET today.   Follow up in 8 weeks with Dr. [name]. I am concerned that her combination of RV failure and renal failure is going to be difficult to manage.   Signed, Rafael Bihari, FNP  07/05/2022  [facility] [address] [facility] [place] [address] [phone] (office) [phone] (fax)

## 2022-07-10 ENCOUNTER — Other Ambulatory Visit (HOSPITAL_COMMUNITY): Payer: Self-pay

## 2022-07-11 ENCOUNTER — Encounter (HOSPITAL_COMMUNITY): Payer: Self-pay

## 2022-07-12 DIAGNOSIS — L97811 Non-pressure chronic ulcer of other part of right lower leg limited to breakdown of skin: Secondary | ICD-10-CM | POA: Diagnosis not present

## 2022-07-20 DIAGNOSIS — I209 Angina pectoris, unspecified: Secondary | ICD-10-CM | POA: Diagnosis not present

## 2022-07-20 DIAGNOSIS — I87319 Chronic venous hypertension (idiopathic) with ulcer of unspecified lower extremity: Secondary | ICD-10-CM | POA: Diagnosis not present

## 2022-07-20 DIAGNOSIS — I509 Heart failure, unspecified: Secondary | ICD-10-CM | POA: Diagnosis not present

## 2022-07-20 DIAGNOSIS — I739 Peripheral vascular disease, unspecified: Secondary | ICD-10-CM | POA: Diagnosis not present

## 2022-07-20 DIAGNOSIS — Z7901 Long term (current) use of anticoagulants: Secondary | ICD-10-CM | POA: Diagnosis not present

## 2022-07-20 DIAGNOSIS — E1151 Type 2 diabetes mellitus with diabetic peripheral angiopathy without gangrene: Secondary | ICD-10-CM | POA: Diagnosis not present

## 2022-07-20 DIAGNOSIS — I2510 Atherosclerotic heart disease of native coronary artery without angina pectoris: Secondary | ICD-10-CM | POA: Diagnosis not present

## 2022-07-20 DIAGNOSIS — E559 Vitamin D deficiency, unspecified: Secondary | ICD-10-CM | POA: Diagnosis not present

## 2022-07-20 DIAGNOSIS — N184 Chronic kidney disease, stage 4 (severe): Secondary | ICD-10-CM | POA: Diagnosis not present

## 2022-07-20 DIAGNOSIS — E1140 Type 2 diabetes mellitus with diabetic neuropathy, unspecified: Secondary | ICD-10-CM | POA: Diagnosis not present

## 2022-07-20 DIAGNOSIS — I1310 Hypertensive heart and chronic kidney disease without heart failure, with stage 1 through stage 4 chronic kidney disease, or unspecified chronic kidney disease: Secondary | ICD-10-CM | POA: Diagnosis not present

## 2022-07-20 DIAGNOSIS — D638 Anemia in other chronic diseases classified elsewhere: Secondary | ICD-10-CM | POA: Diagnosis not present

## 2022-08-07 DIAGNOSIS — I87311 Chronic venous hypertension (idiopathic) with ulcer of right lower extremity: Secondary | ICD-10-CM | POA: Diagnosis not present

## 2022-08-07 DIAGNOSIS — I5022 Chronic systolic (congestive) heart failure: Secondary | ICD-10-CM | POA: Diagnosis not present

## 2022-08-07 DIAGNOSIS — I130 Hypertensive heart and chronic kidney disease with heart failure and stage 1 through stage 4 chronic kidney disease, or unspecified chronic kidney disease: Secondary | ICD-10-CM | POA: Diagnosis not present

## 2022-08-07 DIAGNOSIS — N189 Chronic kidney disease, unspecified: Secondary | ICD-10-CM | POA: Diagnosis not present

## 2022-08-07 DIAGNOSIS — I340 Nonrheumatic mitral (valve) insufficiency: Secondary | ICD-10-CM | POA: Diagnosis not present

## 2022-08-07 DIAGNOSIS — L97811 Non-pressure chronic ulcer of other part of right lower leg limited to breakdown of skin: Secondary | ICD-10-CM | POA: Diagnosis not present

## 2022-08-07 DIAGNOSIS — Z7984 Long term (current) use of oral hypoglycemic drugs: Secondary | ICD-10-CM | POA: Diagnosis not present

## 2022-08-07 DIAGNOSIS — I739 Peripheral vascular disease, unspecified: Secondary | ICD-10-CM | POA: Diagnosis not present

## 2022-08-07 DIAGNOSIS — E1122 Type 2 diabetes mellitus with diabetic chronic kidney disease: Secondary | ICD-10-CM | POA: Diagnosis not present

## 2022-08-07 DIAGNOSIS — I361 Nonrheumatic tricuspid (valve) insufficiency: Secondary | ICD-10-CM | POA: Diagnosis not present

## 2022-08-20 DIAGNOSIS — I509 Heart failure, unspecified: Secondary | ICD-10-CM | POA: Diagnosis not present

## 2022-08-20 DIAGNOSIS — I209 Angina pectoris, unspecified: Secondary | ICD-10-CM | POA: Diagnosis not present

## 2022-08-20 DIAGNOSIS — I1310 Hypertensive heart and chronic kidney disease without heart failure, with stage 1 through stage 4 chronic kidney disease, or unspecified chronic kidney disease: Secondary | ICD-10-CM | POA: Diagnosis not present

## 2022-08-20 DIAGNOSIS — E1151 Type 2 diabetes mellitus with diabetic peripheral angiopathy without gangrene: Secondary | ICD-10-CM | POA: Diagnosis not present

## 2022-08-20 DIAGNOSIS — E1140 Type 2 diabetes mellitus with diabetic neuropathy, unspecified: Secondary | ICD-10-CM | POA: Diagnosis not present

## 2022-08-20 DIAGNOSIS — I739 Peripheral vascular disease, unspecified: Secondary | ICD-10-CM | POA: Diagnosis not present

## 2022-08-20 DIAGNOSIS — N184 Chronic kidney disease, stage 4 (severe): Secondary | ICD-10-CM | POA: Diagnosis not present

## 2022-08-20 DIAGNOSIS — I2510 Atherosclerotic heart disease of native coronary artery without angina pectoris: Secondary | ICD-10-CM | POA: Diagnosis not present

## 2022-08-20 DIAGNOSIS — I87319 Chronic venous hypertension (idiopathic) with ulcer of unspecified lower extremity: Secondary | ICD-10-CM | POA: Diagnosis not present

## 2022-08-20 DIAGNOSIS — Z7901 Long term (current) use of anticoagulants: Secondary | ICD-10-CM | POA: Diagnosis not present

## 2022-08-20 DIAGNOSIS — I482 Chronic atrial fibrillation, unspecified: Secondary | ICD-10-CM | POA: Diagnosis not present

## 2022-08-20 DIAGNOSIS — D638 Anemia in other chronic diseases classified elsewhere: Secondary | ICD-10-CM | POA: Diagnosis not present

## 2022-08-28 ENCOUNTER — Encounter (HOSPITAL_COMMUNITY): Payer: Self-pay | Admitting: Cardiology

## 2022-08-28 ENCOUNTER — Ambulatory Visit (HOSPITAL_COMMUNITY)
Admission: RE | Admit: 2022-08-28 | Discharge: 2022-08-28 | Disposition: A | Discharge status: Home / Self Care | Payer: PPO | Source: Ambulatory Visit | Attending: Cardiology | Admitting: Cardiology

## 2022-08-28 VITALS — BP 106/70 | HR 65 | Wt 130.4 lb

## 2022-08-28 DIAGNOSIS — I493 Ventricular premature depolarization: Secondary | ICD-10-CM | POA: Diagnosis not present

## 2022-08-28 DIAGNOSIS — I4892 Unspecified atrial flutter: Secondary | ICD-10-CM | POA: Insufficient documentation

## 2022-08-28 DIAGNOSIS — I428 Other cardiomyopathies: Secondary | ICD-10-CM | POA: Diagnosis not present

## 2022-08-28 DIAGNOSIS — I5022 Chronic systolic (congestive) heart failure: Secondary | ICD-10-CM | POA: Diagnosis not present

## 2022-08-28 DIAGNOSIS — Z87891 Personal history of nicotine dependence: Secondary | ICD-10-CM | POA: Diagnosis not present

## 2022-08-28 DIAGNOSIS — I2510 Atherosclerotic heart disease of native coronary artery without angina pectoris: Secondary | ICD-10-CM | POA: Diagnosis not present

## 2022-08-28 DIAGNOSIS — I480 Paroxysmal atrial fibrillation: Secondary | ICD-10-CM | POA: Diagnosis not present

## 2022-08-28 DIAGNOSIS — N1832 Chronic kidney disease, stage 3b: Secondary | ICD-10-CM | POA: Insufficient documentation

## 2022-08-28 DIAGNOSIS — Z8249 Family history of ischemic heart disease and other diseases of the circulatory system: Secondary | ICD-10-CM | POA: Insufficient documentation

## 2022-08-28 DIAGNOSIS — Z7901 Long term (current) use of anticoagulants: Secondary | ICD-10-CM | POA: Insufficient documentation

## 2022-08-28 DIAGNOSIS — Z79899 Other long term (current) drug therapy: Secondary | ICD-10-CM | POA: Insufficient documentation

## 2022-08-28 LAB — BRAIN NATRIURETIC PEPTIDE: B Natriuretic Peptide: 913.8 pg/mL — ABNORMAL HIGH (ref 0.0–100.0)

## 2022-08-28 LAB — BASIC METABOLIC PANEL
Anion gap: 9 (ref 5–15)
BUN: 25 mg/dL — ABNORMAL HIGH (ref 8–23)
CO2: 25 mmol/L (ref 22–32)
Calcium: 9 mg/dL (ref 8.9–10.3)
Chloride: 101 mmol/L (ref 98–111)
Creatinine, Ser: 1.45 mg/dL — ABNORMAL HIGH (ref 0.44–1.00)
GFR, Estimated: 35 mL/min — ABNORMAL LOW (ref >60)
Glucose, Bld: 130 mg/dL — ABNORMAL HIGH (ref 70–99)
Potassium: 3.7 mmol/L (ref 3.5–5.1)
Sodium: 135 mmol/L (ref 135–145)

## 2022-08-28 NOTE — Patient Instructions (Signed)
INCREASE Torsemide to 40 mg daily for 3 days, then '20mg'$  daily after that.  Labs done today, your results will be available in MyChart, we will contact you for abnormal readings.  Repeat blood work in 10 days   [place].  Your physician recommends that you schedule a follow-up appointment in: 3 weeks  If you have any questions or concerns before your next appointment please send [place] a message through [place] or call our office at [phone].    TO LEAVE A MESSAGE FOR THE NURSE SELECT OPTION 2, PLEASE LEAVE A MESSAGE INCLUDING: YOUR NAME DATE OF BIRTH CALL BACK NUMBER REASON FOR CALL**this is important as we prioritize the call backs  YOU WILL RECEIVE A CALL BACK THE SAME DAY AS LONG AS YOU CALL BEFORE 4:00 PM  At the [facility], you and your health needs are our priority. As part of our continuing mission to provide you with exceptional heart care, we have created designated Provider Care Teams. These Care Teams include your primary Cardiologist (physician) and Advanced Practice Providers (APPs- Physician Assistants and Nurse Practitioners) who all work together to provide you with the care you need, when you need it.   You may see any of the following providers on your designated Care Team at your next follow up: Dr Glori Bickers Dr [name] Dr. Roxana Hires, NP Lyda Jester, [place] [facility] [place], [place] Forestine Na, NP Audry Riles, PharmD   Please be sure to bring in all your medications bottles to every appointment.    Thank you for choosing [facility]

## 2022-08-28 NOTE — Progress Notes (Signed)
ID:  Shannon [name], DOB Nov 08, 1931, MRN [id number]  Provider location: [facility] Type of Visit: Established patient   PCP:  Tisovec, Fransico Him, MD  Cardiologist:  Mertie Moores, MD HF Cardiology: Dr. [name]   History of Present Illness: SA Shannon [name] is a 89 y.o. female who has a history of paroxysmal atrial fibrillation, chronic systolic CHF, and CAD. She apparently had been in persistent atrial fibrillation since around 8/16.  It appears that she was tachycardic most of the time prior to her recent admission in 7/17.  She had been on amiodarone prior to admission but it was not keeping her in NSR.  She was admitted with acute systolic CHF.  EF had dropped to 20-25% in 7/17 from 50-55% in 10/16.  She was in atrial fibrillation with RVR.  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  Amiodarone was stopped due to suspicion for amiodarone-induced lung toxicity (she was seen by pulmonary).  She was eventually cardioverted to NSR.  Ranolazine was started to try to keep her in NSR.  HR was low in the hospital so she has not been on a beta blocker.  She was extensively diuresed and discharged to SNF.  After discharge, she developed persistent nausea.  Eventually, it was found that the nausea was likely from ranolazine, so this medication was stopped and the nausea resolved.     Patient continued to have difficult-to-control atrial fibrillation.  GFR was too low for Tikosyn.  She therefore had AV nodal ablation with St Jude CRT-P device placed in 3/18.  She developed a pocket hematoma and had pocket revision with sub-pectoral placement in 5/18.    Echo in 9/19 showed EF up to 60-65%, moderate MR, moderate TR, PASP 43 mmHg. Echo in 10/20 showed EF 55-60%, moderately decreased RV systolic function with moderate RV enlargement, moderate-severe biatrial enlargement, moderate-severe TR, mild MR.   Echo in 4/22 showed EF 55% with  mild LVH, mild RV enlargement with mildly decreased RV systolic function, severe biatrial enlargement, mild MR, moderate TR.  Echo in 5/23 showed EF 50-55%, moderate RV enlargement with normal systolic function, PASP 42 mmHg, moderate MR, severe TR.   Patient was admitted to the hospital in [facility] in 12/23 with CHF, predominantly RV failure.  Echo there showed EF 45-50%, lateral hypokinesis, severe RV dilation with mildly decreased RV systolic function, severe TR.   Today she returns for HF follow up with her sons. She recently started back on spironolactone.  She has been short of breath walking across her house or with ADLs such as getting dressed. She uses a walker.  No falls or lightheadedness. She has occasional atypical chest pain.  Chronic 2 pillow orthopnea.  Chronic cough. Weight is up 5 lbs.  Ankles have been swelling.   ECG (personally reviewed): Atrial fibrillation with BiV pacing  St Jude device interrogation: decreased thoracic impedance, >99% BiV pacing.    Labs (7/17): Digoxin 1.6 => 0.3, K 4.6, creatinine 0.87 => 0.76, HCT 34, BNP 265 Labs (9/17): K 5, creatinine 1.36 Labs (11/17): K 4.9, creatinine 1.29, hgb 9.2, digoxin 0.5 Labs (12/17): hgb 10.4, digoxin 0.4 Labs (1/18): K 4, creatinine 1.62 => 1.49, hgb 9.5, LDL 42, HDL 63 Labs (3/18): K 4.1, creatinine 1.44, digoxin 0.4 Labs (6/18): K 3.9, creatinine 1.7, hgb 11.5, LDL 56, HDL 50 Labs (6/19): K 4.4, creatinine 1.7, hgb 12.8, LDL 54 Labs (10/19): K 4.2, creatinine 1.46 Labs (6/21):  K 4.6, creatinine 1.5, LDL 43, HDL 51 Labs (3/22): BNP 635, LFTs normal, K 3.8, creatinine 1.81 Labs (5/23): K 4.3, creatinine 1.63 => 1.67 Labs (9/23): K 4.0, creatinine 2.11 Labs (11/23): K 5.1, creatinine 1.74 Labs (1/24): BNP 704, K 3.3, creatinine 2.54 => 1.68    PMH: 1. Atrial fibrillation: Paroxysmal.  Had been persistent since 8/16, was cardioverted to NSR during 7/17 admission.  Possible atypical atrial flutter on 12/17 ECG.  By  3/18, atrial fibrillation again.  - Nausea with ranolazine used for atrial fibrillation suppression.  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  - AV nodal ablation 3/18 with CRT-P.  2. 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  She had been in atrial fibrillation with elevated rate possibly for months prior to 7/17 admission.  - Echo (10/16): EF 50-55%.  - Echo (7/17): EF 20-25%, mildly decreased RV systolic function, severe TR, PA systolic pressure 64 mmHg.  - LHC/RHC (7/17): Patent OM stent, no obstructive CAD.  Mean RA 13, PA 45/25, mean PCWP 16, CI 2.36.  - Echo (1/18): EF 30-35% with diffuse hypokinesis, mildly decreased RV systolic function, moderate TR and MR, PASP 53 mmHg, severe LAE.  - St Jude CRT-P placed with AV nodal ablation in 3/18.  - Echo (9/19): EF 60-65%, moderate LVH, grade 3 diastolic dysfunction, mild MR, mild AI, moderate TR, PASP 43 mmHg.  - Echo (10/20): EF 55-60%, moderately decreased RV systolic function with moderate RV enlargement, moderate-severe biatrial enlargement, moderate-severe TR, mild MR.  - Echo (4/22): EF 55% with mild LVH, mild RV enlargement with mildly decreased RV systolic function, severe biatrial enlargement, mild MR, moderate TR.  - Echo (5/23): EF 50-55%, moderate RV enlargement with normal systolic function, PASP 42 mmHg, moderate MR, severe TR.  - Echo (12/23): EF 45-50%, lateral hypokinesis, severe RV dilation with mildly decreased RV systolic function, severe TR.  3. CAD: DES to OM in 2007.  LHC (7/17) with patent stent and no new obstructive disease.  4. Type II diabetes 5. Hyperlipidemia 6. TIA 7. Possible amiodarone lung toxicity: Chest CT (7/17) with bronchiectasis/fibrosis, patchy bilateral densities.  8. CKD stage 3  Past Surgical History:  Procedure Laterality Date   ATRIAL FLUTTER ABLATION N/A 10/09/2012   Procedure: ATRIAL FLUTTER ABLATION;  Surgeon: Thompson Grayer, MD;  Location: [facility] CATH  LAB;  Service: Cardiovascular;  Laterality: N/A;   AV NODE ABLATION N/A 09/27/2016   Procedure: AV Node Ablation;  Surgeon: Thompson Grayer, MD;  Location: [place] CV LAB;  Service: Cardiovascular;  Laterality: N/A;   BIV PACEMAKER INSERTION CRT-P N/A 09/27/2016   Procedure: BiV Pacemaker Insertion CRT-P;  Surgeon: Thompson Grayer, MD;  Location: [place] [facility] LAB;  Service: Cardiovascular;  Laterality: N/A;   CARDIAC CATHETERIZATION N/A 01/06/2016   Procedure: Right/Left Heart Cath and Coronary Angiography;  Surgeon: Belva Crome, MD;  Location: [place] [facility] LAB;  Service: Cardiovascular;  Laterality: N/A;   CARDIOVERSION N/A 01/11/2016   Procedure: CARDIOVERSION;  Surgeon: Larey Dresser, MD;  Location: [place];  Service: Cardiovascular;  Laterality: N/A;   cataracts     COLONOSCOPY N/A 10/13/2015   Procedure: COLONOSCOPY;  Surgeon: Manus Gunning, MD;  Location: [facility];  Service: Gastroenterology;  Laterality: N/A;   CORONARY ANGIOPLASTY     Status post PTCA and stenting of the first obtuse marginal-05/13/2006. We placed a 3.0 x 20 mm Taxus stent. It was post dilated using a 3.25 mm noncompliant balloon up to 14 atmospheres   IMPLANT POCKET  INCISION & DRAINAGE N/A 11/02/2016   Procedure: Implant Pocket Incision & Drainage;  Surgeon: Evans Lance, MD;  Location: [place] [facility] LAB;  Service: Cardiovascular;  Laterality: N/A;   PPM GENERATOR CHANGEOUT N/A 11/28/2021   Procedure: PPM GENERATOR CHANGEOUT;  Surgeon: Evans Lance, MD;  Location: [place] [facility] LAB;  Service: Cardiovascular;  Laterality: N/A;   TONSILLECTOMY     Current Outpatient Medications  Medication Sig Dispense Refill   ACCU-CHEK FASTCLIX LANCETS MISC USE TO CHECK BLOOD SUGAR TWICE DAILY  5   ACCU-CHEK SMARTVIEW test strip      apixaban (ELIQUIS) 2.5 MG TABS tablet Take 1 tablet (2.5 mg total) by mouth 2 (two) times daily. Resume 6/4 with the evening dose 60 tablet 11   atorvastatin (LIPITOR) 20 MG  tablet TAKE 1 TABLET EVERY DAY 30 tablet 11   dapagliflozin propanediol (FARXIGA) 10 MG TABS tablet Take 1 tablet (10 mg total) by mouth daily before breakfast. 30 tablet 11   gabapentin (NEURONTIN) 100 MG capsule 1 capsule Orally Once a day for 30 days     Homeopathic Products (ARNICARE ARTHRITIS) TBDP Take 1 tablet by mouth daily as needed (mouth pain).     loratadine (CLARITIN) 10 MG tablet Take 10 mg by mouth daily as needed for allergies.     metFORMIN (GLUCOPHAGE) 500 MG tablet Take 500 mg by mouth daily.     nitroGLYCERIN (NITROSTAT) 0.4 MG SL tablet Take 1 tablet as needed. 75 tablet 1   OVER THE COUNTER MEDICATION Apply 1 application. topically daily as needed (arthritis). Artrosilium topical cream     OVER THE COUNTER MEDICATION Apply 1 application. topically daily as needed (leg pain). magnilife db foot cream     spironolactone (ALDACTONE) 25 MG tablet 1 tablet Orally once a day for 90 days     torsemide (DEMADEX) 20 MG tablet Take 1 tablet (20 mg total) by mouth every other day. 90 tablet 3   No current facility-administered medications for this encounter.    Allergies:   Amiodarone, Ranexa [ranolazine], Other, Demerol, Erythromycin, Pecan nut (diagnostic), Penicillins, Percocet [oxycodone-acetaminophen], and Tomato   Social History:  The patient  reports that she quit smoking about 59 years ago. Her smoking use included cigarettes. She smoked an average of 1 pack per day. She has never used smokeless tobacco. She reports that she does not drink alcohol and does not use drugs.   Family History:  The patient's family history includes Alzheimer's disease in her sister; Coronary artery disease in her sister; Heart attack in her brother, father, mother, and sister; Stroke in her mother and sister.   ROS:  Please see the history of present illness.   All other systems are personally reviewed and negative.   Recent Labs: 11/06/2021: Hemoglobin 12.4; Platelets 151 08/28/2022: B Natriuretic  Peptide 913.8; BUN 25; Creatinine, Ser 1.45; Potassium 3.7; Sodium 135  Personally reviewed   Wt Readings from Last 3 Encounters:  08/28/22 59.1 kg (130 lb 6.4 oz)  07/05/22 56.7 kg (125 lb)  06/20/22 57.2 kg (126 lb)   BP 106/70   Pulse 65   Wt 59.1 kg (130 lb 6.4 oz)   SpO2 95%   BMI 23.85 kg/m  General: NAD Neck: JVP 12 cm, no thyromegaly or thyroid nodule.  Lungs: Clear to auscultation bilaterally with normal respiratory effort. CV: Nondisplaced PMI.  Heart regular S1/S2, no S3/S4, no murmur.  2+ edema to knees.  No carotid bruit.  Difficult to pedal pulses due  to edema.  Abdomen: Soft, nontender, no hepatosplenomegaly, no distention.  Skin: Intact without lesions or rashes.  Neurologic: Alert and oriented x 3.  Psych: Normal affect. Extremities: No clubbing or cyanosis.  HEENT: Normal.   Assessment/Plan 1. Chronic systolic=>diastolic CHF: EF 0000000 on 7/17 echo, 30-35% on 1/18 echo.  Nonischemic cardiomyopathy, possible role for tachycardia-mediated cardiomyopathy (some improvement in EF with rate control but not back to normal). ECHO 03/2017 EF 40-45%.  She had AV nodal ablation due to difficult-to-control atrial fibrillation in 3/18 with St Jude CRT-P placement. Repeat echo in 9/19 showed EF up to 60-65%.  Echo in 10/20 with EF 55-60%, moderately decreased RV systolic function with moderate RV enlargement, moderate-severe biatrial enlargement, moderate-severe TR, mild MR.  Echo in 4/22 with EF 55% with mild LVH, mild RV enlargement with mildly decreased RV systolic function, severe biatrial enlargement, mild MR, moderate TR.  Echo (5/23) with EF 50-55%, moderate RV enlargement with normal systolic function, PASP 42 mmHg, moderate MR, severe TR.  Echo in 12/23 with EF 45-50%, lateral hypokinesis, severe RV dilation with mildly decreased RV systolic function, severe TR.  At this point, she appears to have predominantly RV failure with severe TR.  This is complicated by CKD stage 3b. She  is volume overloaded by exam and [place] with NYHA class III symptoms.  - Increase torsemide to 40 mg daily x 3 days then 20 mg daily after that.  BMET/BNP today, BMET 10 days.  - Unable to tolerate losartan at even low dose.  - Hold off on bisoprolol with volume overload and low BP.    - Continue spironolactone 25 daily, recently restarted.  - Continue dapagliflozin 10 mg daily.  - Wear compression stockings during the day.  2. Atrial fibrillation/flutter:  Concern for prior tachy-mediated cardiomyopathy.  She had suspected amiodarone-induced lung toxicity so ranolazine was started.  She did not tolerate ranolazine due to nausea. Poor Tikosyn candidate with CKD.   She tolerated atrial fibrillation poorly and rate was difficult to control, so she had AV nodal ablation with St Jude CRT-P device.  She is in chronic AF.  - Continue apixaban 2.5 mg bid (age, creatinine). No bleeding issues. 3. CAD: Patent OM stent on coronary angiography in 7/17.  No chest pain. .  - Continue statin. - No ASA with apixaban use.   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  Off amiodarone.  Follows with pulmonary.  5. CKD stage 3: Recent BMET with creatinine 1.68.    Follow up in 3 wks with APP.  I am concerned that her combination of RV failure and renal failure is going to be difficult to manage.   Signed, [name], MD  08/28/2022  [facility] [address] [facility] [place] [address] [phone] (office) [phone] (fax)

## 2022-08-31 ENCOUNTER — Ambulatory Visit: Payer: PPO

## 2022-08-31 DIAGNOSIS — I428 Other cardiomyopathies: Secondary | ICD-10-CM | POA: Diagnosis not present

## 2022-09-03 LAB — CUP PACEART REMOTE DEVICE CHECK
Battery Remaining Longevity: 40 mo
Battery Remaining Percentage: 87 %
Battery Voltage: 2.98 V
Date Time Interrogation Session: 20240301040550
Implantable Lead Connection Status: 753985
Implantable Lead Connection Status: 753985
Implantable Lead Connection Status: 753985
Implantable Lead Implant Date: 20180329
Implantable Lead Implant Date: 20180329
Implantable Lead Implant Date: 20180329
Implantable Lead Location: 753859
Implantable Lead Location: 753860
Implantable Lead Location: 753860
Implantable Lead Model: 3830
Implantable Lead Model: 5076
Implantable Pulse Generator Implant Date: 20230530
Lead Channel Impedance Value: 280 Ohm
Lead Channel Impedance Value: 340 Ohm
Lead Channel Pacing Threshold Amplitude: 1 V
Lead Channel Pacing Threshold Amplitude: 2.5 V
Lead Channel Pacing Threshold Pulse Width: 0.5 ms
Lead Channel Pacing Threshold Pulse Width: 1 ms
Lead Channel Sensing Intrinsic Amplitude: 4.2 mV
Lead Channel Setting Pacing Amplitude: 2 V
Lead Channel Setting Pacing Amplitude: 3.5 V
Lead Channel Setting Pacing Pulse Width: 0.5 ms
Lead Channel Setting Pacing Pulse Width: 1 ms
Lead Channel Setting Sensing Sensitivity: 4 mV
Pulse Gen Model: 3222
Pulse Gen Serial Number: 8080981

## 2022-09-07 ENCOUNTER — Ambulatory Visit (HOSPITAL_COMMUNITY)
Admission: RE | Admit: 2022-09-07 | Discharge: 2022-09-07 | Disposition: A | Discharge status: Home / Self Care | Payer: PPO | Source: Ambulatory Visit | Attending: Internal Medicine | Admitting: Internal Medicine

## 2022-09-07 DIAGNOSIS — I5022 Chronic systolic (congestive) heart failure: Secondary | ICD-10-CM | POA: Diagnosis not present

## 2022-09-07 LAB — BASIC METABOLIC PANEL
Anion gap: 10 (ref 5–15)
BUN: 27 mg/dL — ABNORMAL HIGH (ref 8–23)
CO2: 27 mmol/L (ref 22–32)
Calcium: 9.1 mg/dL (ref 8.9–10.3)
Chloride: 97 mmol/L — ABNORMAL LOW (ref 98–111)
Creatinine, Ser: 1.54 mg/dL — ABNORMAL HIGH (ref 0.44–1.00)
GFR, Estimated: 32 mL/min — ABNORMAL LOW (ref >60)
Glucose, Bld: 203 mg/dL — ABNORMAL HIGH (ref 70–99)
Potassium: 4.6 mmol/L (ref 3.5–5.1)
Sodium: 134 mmol/L — ABNORMAL LOW (ref 135–145)

## 2022-09-17 NOTE — Progress Notes (Signed)
ID:  Shannon [name], DOB Nov 08, 1931, MRN [id number]  Provider location: [facility] Type of Visit: Established patient   PCP:  Tisovec, Fransico Him, MD  Cardiologist:  Shannon Moores, MD HF Cardiology: Dr. [name]   History of Present Illness: Shannon [name] is a 89 y.o. female who has a history of paroxysmal atrial fibrillation, chronic systolic CHF, and CAD. She apparently had been in persistent atrial fibrillation since around 8/16.  It appears that she was tachycardic most of the time prior to her recent admission in 7/17.  She had been on amiodarone prior to admission but it was not keeping her in NSR.  She was admitted with acute systolic CHF.  EF had dropped to 20-25% in 7/17 from 50-55% in 10/16.  She was in atrial fibrillation with RVR.  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  Amiodarone was stopped due to suspicion for amiodarone-induced lung toxicity (she was seen by pulmonary).  She was eventually cardioverted to NSR.  Ranolazine was started to try to keep her in NSR.  HR was low in the hospital so she has not been on a beta blocker.  She was extensively diuresed and discharged to SNF.  After discharge, she developed persistent nausea.  Eventually, it was found that the nausea was likely from ranolazine, so this medication was stopped and the nausea resolved.     Patient continued to have difficult-to-control atrial fibrillation.  GFR was too low for Tikosyn.  She therefore had AV nodal ablation with St Jude CRT-P device placed in 3/18.  She developed a pocket hematoma and had pocket revision with sub-pectoral placement in 5/18.    Echo in 9/19 showed EF up to 60-65%, moderate MR, moderate TR, PASP 43 mmHg. Echo in 10/20 showed EF 55-60%, moderately decreased RV systolic function with moderate RV enlargement, moderate-severe biatrial enlargement, moderate-severe TR, mild MR.   Echo in 4/22 showed EF 55% with  mild LVH, mild RV enlargement with mildly decreased RV systolic function, severe biatrial enlargement, mild MR, moderate TR.  Echo in 5/23 showed EF 50-55%, moderate RV enlargement with normal systolic function, PASP 42 mmHg, moderate MR, severe TR.   Patient was admitted to the hospital in [facility] in 12/23 with CHF, predominantly RV failure.  Echo there showed EF 45-50%, lateral hypokinesis, severe RV dilation with mildly decreased RV systolic function, severe TR.   Today she returns for HF follow up with her sons. She recently started back on spironolactone.  She has been short of breath walking across her house or with ADLs such as getting dressed. She uses a walker.  No falls or lightheadedness. She has occasional atypical chest pain.  Chronic 2 pillow orthopnea.  Chronic cough. Weight is up 5 lbs.  Ankles have been swelling.   ECG (personally reviewed): Atrial fibrillation with BiV pacing  St Jude device interrogation: decreased thoracic impedance, >99% BiV pacing.    Labs (7/17): Digoxin 1.6 => 0.3, K 4.6, creatinine 0.87 => 0.76, HCT 34, BNP 265 Labs (9/17): K 5, creatinine 1.36 Labs (11/17): K 4.9, creatinine 1.29, hgb 9.2, digoxin 0.5 Labs (12/17): hgb 10.4, digoxin 0.4 Labs (1/18): K 4, creatinine 1.62 => 1.49, hgb 9.5, LDL 42, HDL 63 Labs (3/18): K 4.1, creatinine 1.44, digoxin 0.4 Labs (6/18): K 3.9, creatinine 1.7, hgb 11.5, LDL 56, HDL 50 Labs (6/19): K 4.4, creatinine 1.7, hgb 12.8, LDL 54 Labs (10/19): K 4.2, creatinine 1.46 Labs (6/21):  K 4.6, creatinine 1.5, LDL 43, HDL 51 Labs (3/22): BNP 635, LFTs normal, K 3.8, creatinine 1.81 Labs (5/23): K 4.3, creatinine 1.63 => 1.67 Labs (9/23): K 4.0, creatinine 2.11 Labs (11/23): K 5.1, creatinine 1.74 Labs (1/24): BNP 704, K 3.3, creatinine 2.54 => 1.68    PMH: 1. Atrial fibrillation: Paroxysmal.  Had been persistent since 8/16, was cardioverted to NSR during 7/17 admission.  Possible atypical atrial flutter on 12/17 ECG.  By  3/18, atrial fibrillation again.  - Nausea with ranolazine used for atrial fibrillation suppression.  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  - AV nodal ablation 3/18 with CRT-P.  2. 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  She had been in atrial fibrillation with elevated rate possibly for months prior to 7/17 admission.  - Echo (10/16): EF 50-55%.  - Echo (7/17): EF 20-25%, mildly decreased RV systolic function, severe TR, PA systolic pressure 64 mmHg.  - LHC/RHC (7/17): Patent OM stent, no obstructive CAD.  Mean RA 13, PA 45/25, mean PCWP 16, CI 2.36.  - Echo (1/18): EF 30-35% with diffuse hypokinesis, mildly decreased RV systolic function, moderate TR and MR, PASP 53 mmHg, severe LAE.  - St Jude CRT-P placed with AV nodal ablation in 3/18.  - Echo (9/19): EF 60-65%, moderate LVH, grade 3 diastolic dysfunction, mild MR, mild AI, moderate TR, PASP 43 mmHg.  - Echo (10/20): EF 55-60%, moderately decreased RV systolic function with moderate RV enlargement, moderate-severe biatrial enlargement, moderate-severe TR, mild MR.  - Echo (4/22): EF 55% with mild LVH, mild RV enlargement with mildly decreased RV systolic function, severe biatrial enlargement, mild MR, moderate TR.  - Echo (5/23): EF 50-55%, moderate RV enlargement with normal systolic function, PASP 42 mmHg, moderate MR, severe TR.  - Echo (12/23): EF 45-50%, lateral hypokinesis, severe RV dilation with mildly decreased RV systolic function, severe TR.  3. CAD: DES to OM in 2007.  LHC (7/17) with patent stent and no new obstructive disease.  4. Type II diabetes 5. Hyperlipidemia 6. TIA 7. Possible amiodarone lung toxicity: Chest CT (7/17) with bronchiectasis/fibrosis, patchy bilateral densities.  8. CKD stage 3  Past Surgical History:  Procedure Laterality Date   ATRIAL FLUTTER ABLATION N/A 10/09/2012   Procedure: ATRIAL FLUTTER ABLATION;  Surgeon: Thompson Grayer, MD;  Location: [facility] CATH  LAB;  Service: Cardiovascular;  Laterality: N/A;   AV NODE ABLATION N/A 09/27/2016   Procedure: AV Node Ablation;  Surgeon: Thompson Grayer, MD;  Location: [place] CV LAB;  Service: Cardiovascular;  Laterality: N/A;   BIV PACEMAKER INSERTION CRT-P N/A 09/27/2016   Procedure: BiV Pacemaker Insertion CRT-P;  Surgeon: Thompson Grayer, MD;  Location: [place] CV LAB;  Service: Cardiovascular;  Laterality: N/A;   CARDIAC CATHETERIZATION N/A 01/06/2016   Procedure: Right/Left Heart Cath and Coronary Angiography;  Surgeon: Belva Crome, MD;  Location: [place] CV LAB;  Service: Cardiovascular;  Laterality: N/A;   CARDIOVERSION N/A 01/11/2016   Procedure: CARDIOVERSION;  Surgeon: Larey Dresser, MD;  Location: [place];  Service: Cardiovascular;  Laterality: N/A;   cataracts     COLONOSCOPY N/A 10/13/2015   Procedure: COLONOSCOPY;  Surgeon: Manus Gunning, MD;  Location: [place];  Service: Gastroenterology;  Laterality: N/A;   CORONARY ANGIOPLASTY     Status post PTCA and stenting of the first obtuse marginal-05/13/2006. We placed a 3.0 x 20 mm Taxus stent. It was post dilated using a 3.25 mm noncompliant balloon up to 14 atmospheres   IMPLANT POCKET  INCISION & DRAINAGE N/A 11/02/2016   Procedure: Implant Pocket Incision & Drainage;  Surgeon: Evans Lance, MD;  Location: [place] CV LAB;  Service: Cardiovascular;  Laterality: N/A;   PPM GENERATOR CHANGEOUT N/A 11/28/2021   Procedure: PPM GENERATOR CHANGEOUT;  Surgeon: Evans Lance, MD;  Location: [place] CV LAB;  Service: Cardiovascular;  Laterality: N/A;   TONSILLECTOMY     Current Outpatient Medications  Medication Sig Dispense Refill   ACCU-CHEK FASTCLIX LANCETS MISC USE TO CHECK BLOOD SUGAR TWICE DAILY  5   ACCU-CHEK SMARTVIEW test strip      apixaban (ELIQUIS) 2.5 MG TABS tablet Take 1 tablet (2.5 mg total) by mouth 2 (two) times daily. Resume 6/4 with the evening dose 60 tablet 11   atorvastatin (LIPITOR) 20 MG  tablet TAKE 1 TABLET EVERY DAY 30 tablet 11   dapagliflozin propanediol (FARXIGA) 10 MG TABS tablet Take 1 tablet (10 mg total) by mouth daily before breakfast. 30 tablet 11   gabapentin (NEURONTIN) 100 MG capsule 1 capsule Orally Once a day for 30 days     Homeopathic Products (ARNICARE ARTHRITIS) TBDP Take 1 tablet by mouth daily as needed (mouth pain).     loratadine (CLARITIN) 10 MG tablet Take 10 mg by mouth daily as needed for allergies.     metFORMIN (GLUCOPHAGE) 500 MG tablet Take 500 mg by mouth daily.     nitroGLYCERIN (NITROSTAT) 0.4 MG SL tablet Take 1 tablet as needed. 75 tablet 1   OVER THE COUNTER MEDICATION Apply 1 application. topically daily as needed (arthritis). Artrosilium topical cream     OVER THE COUNTER MEDICATION Apply 1 application. topically daily as needed (leg pain). magnilife db foot cream     spironolactone (ALDACTONE) 25 MG tablet 1 tablet Orally once a day for 90 days     torsemide (DEMADEX) 20 MG tablet Take 1 tablet (20 mg total) by mouth every other day. 90 tablet 3   No current facility-administered medications for this visit.    Allergies:   Amiodarone, Ranexa [ranolazine], Other, Demerol, Erythromycin, Pecan nut (diagnostic), Penicillins, Percocet [oxycodone-acetaminophen], and Tomato   Social History:  The patient  reports that she quit smoking about 59 years ago. Her smoking use included cigarettes. She smoked an average of 1 pack per day. She has never used smokeless tobacco. She reports that she does not drink alcohol and does not use drugs.   Family History:  The patient's family history includes Alzheimer's disease in her sister; Coronary artery disease in her sister; Heart attack in her brother, father, mother, and sister; Stroke in her mother and sister.   ROS:  Please see the history of present illness.   All other systems are personally reviewed and negative.   Recent Labs: 11/06/2021: Hemoglobin 12.4; Platelets 151 08/28/2022: B Natriuretic  Peptide 913.8 09/07/2022: BUN 27; Creatinine, Ser 1.54; Potassium 4.6; Sodium 134  Personally reviewed   Wt Readings from Last 3 Encounters:  08/28/22 59.1 kg (130 lb 6.4 oz)  07/05/22 56.7 kg (125 lb)  06/20/22 57.2 kg (126 lb)   There were no vitals taken for this visit. General: NAD Neck: JVP 12 cm, no thyromegaly or thyroid nodule.  Lungs: Clear to auscultation bilaterally with normal respiratory effort. CV: Nondisplaced PMI.  Heart regular S1/S2, no S3/S4, no murmur.  2+ edema to knees.  No carotid bruit.  Difficult to pedal pulses due to edema.  Abdomen: Soft, nontender, no hepatosplenomegaly, no distention.  Skin: Intact without lesions or  rashes.  Neurologic: Alert and oriented x 3.  Psych: Normal affect. Extremities: No clubbing or cyanosis.  HEENT: Normal.   Assessment/Plan 1. Chronic systolic=>diastolic CHF: EF 0000000 on 7/17 echo, 30-35% on 1/18 echo.  Nonischemic cardiomyopathy, possible role for tachycardia-mediated cardiomyopathy (some improvement in EF with rate control but not back to normal). ECHO 03/2017 EF 40-45%.  She had AV nodal ablation due to difficult-to-control atrial fibrillation in 3/18 with St Jude CRT-P placement. Repeat echo in 9/19 showed EF up to 60-65%.  Echo in 10/20 with EF 55-60%, moderately decreased RV systolic function with moderate RV enlargement, moderate-severe biatrial enlargement, moderate-severe TR, mild MR.  Echo in 4/22 with EF 55% with mild LVH, mild RV enlargement with mildly decreased RV systolic function, severe biatrial enlargement, mild MR, moderate TR.  Echo (5/23) with EF 50-55%, moderate RV enlargement with normal systolic function, PASP 42 mmHg, moderate MR, severe TR.  Echo in 12/23 with EF 45-50%, lateral hypokinesis, severe RV dilation with mildly decreased RV systolic function, severe TR.  At this point, she appears to have predominantly RV failure with severe TR.  This is complicated by CKD stage 3b. She is volume overloaded by exam  and Corvue with NYHA class III symptoms.  - Increase torsemide to 40 mg daily x 3 days then 20 mg daily after that.  BMET/BNP today, BMET 10 days.  - Unable to tolerate losartan at even low dose.  - Hold off on bisoprolol with volume overload and low BP.    - Continue spironolactone 25 daily, recently restarted.  - Continue dapagliflozin 10 mg daily.  - Wear compression stockings during the day.  2. Atrial fibrillation/flutter:  Concern for prior tachy-mediated cardiomyopathy.  She had suspected amiodarone-induced lung toxicity so ranolazine was started.  She did not tolerate ranolazine due to nausea. Poor Tikosyn candidate with CKD.   She tolerated atrial fibrillation poorly and rate was difficult to control, so she had AV nodal ablation with St Jude CRT-P device.  She is in chronic AF.  - Continue apixaban 2.5 mg bid (age, creatinine). No bleeding issues. 3. CAD: Patent OM stent on coronary angiography in 7/17.  No chest pain. .  - Continue statin. - No ASA with apixaban use.   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  Off amiodarone.  Follows with pulmonary.  5. CKD stage 3: Recent BMET with creatinine 1.68.    Follow up in 3 wks with APP.  I am concerned that her combination of RV failure and renal failure is going to be difficult to manage.   Signed, Rafael Bihari, FNP  09/17/2022  [facility] [address] [facility] and [place] [address] [phone] (office) [phone] (fax)

## 2022-09-18 ENCOUNTER — Ambulatory Visit (HOSPITAL_COMMUNITY)
Admission: RE | Admit: 2022-09-18 | Discharge: 2022-09-18 | Disposition: A | Discharge status: Home / Self Care | Payer: PPO | Source: Ambulatory Visit | Attending: Family Medicine | Admitting: Family Medicine

## 2022-09-18 ENCOUNTER — Encounter (HOSPITAL_COMMUNITY): Payer: Self-pay

## 2022-09-18 VITALS — BP 126/70 | HR 60 | Wt 124.2 lb

## 2022-09-18 DIAGNOSIS — I5032 Chronic diastolic (congestive) heart failure: Secondary | ICD-10-CM | POA: Diagnosis not present

## 2022-09-18 DIAGNOSIS — K921 Melena: Secondary | ICD-10-CM | POA: Insufficient documentation

## 2022-09-18 DIAGNOSIS — Z7901 Long term (current) use of anticoagulants: Secondary | ICD-10-CM | POA: Insufficient documentation

## 2022-09-18 DIAGNOSIS — E785 Hyperlipidemia, unspecified: Secondary | ICD-10-CM | POA: Insufficient documentation

## 2022-09-18 DIAGNOSIS — Z8673 Personal history of transient ischemic attack (TIA), and cerebral infarction without residual deficits: Secondary | ICD-10-CM | POA: Insufficient documentation

## 2022-09-18 DIAGNOSIS — Z7984 Long term (current) use of oral hypoglycemic drugs: Secondary | ICD-10-CM | POA: Insufficient documentation

## 2022-09-18 DIAGNOSIS — N1832 Chronic kidney disease, stage 3b: Secondary | ICD-10-CM | POA: Diagnosis not present

## 2022-09-18 DIAGNOSIS — I4892 Unspecified atrial flutter: Secondary | ICD-10-CM | POA: Insufficient documentation

## 2022-09-18 DIAGNOSIS — Z79899 Other long term (current) drug therapy: Secondary | ICD-10-CM | POA: Diagnosis not present

## 2022-09-18 DIAGNOSIS — E1136 Type 2 diabetes mellitus with diabetic cataract: Secondary | ICD-10-CM | POA: Insufficient documentation

## 2022-09-18 DIAGNOSIS — E1122 Type 2 diabetes mellitus with diabetic chronic kidney disease: Secondary | ICD-10-CM | POA: Insufficient documentation

## 2022-09-18 DIAGNOSIS — I5042 Chronic combined systolic (congestive) and diastolic (congestive) heart failure: Secondary | ICD-10-CM | POA: Diagnosis not present

## 2022-09-18 DIAGNOSIS — I2510 Atherosclerotic heart disease of native coronary artery without angina pectoris: Secondary | ICD-10-CM

## 2022-09-18 DIAGNOSIS — N183 Chronic kidney disease, stage 3 unspecified: Secondary | ICD-10-CM

## 2022-09-18 DIAGNOSIS — I4819 Other persistent atrial fibrillation: Secondary | ICD-10-CM | POA: Diagnosis not present

## 2022-09-18 DIAGNOSIS — I428 Other cardiomyopathies: Secondary | ICD-10-CM | POA: Diagnosis not present

## 2022-09-18 LAB — CBC
HCT: 35.8 % — ABNORMAL LOW (ref 36.0–46.0)
Hemoglobin: 11.8 g/dL — ABNORMAL LOW (ref 12.0–15.0)
MCH: 35.5 pg — ABNORMAL HIGH (ref 26.0–34.0)
MCHC: 33 g/dL (ref 30.0–36.0)
MCV: 107.8 fL — ABNORMAL HIGH (ref 80.0–100.0)
Platelets: 153 10*3/uL (ref 150–400)
RBC: 3.32 MIL/uL — ABNORMAL LOW (ref 3.87–5.11)
RDW: 16.8 % — ABNORMAL HIGH (ref 11.5–15.5)
WBC: 5.1 10*3/uL (ref 4.0–10.5)
nRBC: 0 % (ref 0.0–0.2)

## 2022-09-18 LAB — FERRITIN: Ferritin: 72 ng/mL (ref 11–307)

## 2022-09-18 LAB — IRON AND TIBC
Iron: 68 ug/dL (ref 28–170)
Saturation Ratios: 18 % (ref 10.4–31.8)
TIBC: 389 ug/dL (ref 250–450)
UIBC: 321 ug/dL

## 2022-09-18 MED ORDER — TORSEMIDE 20 MG PO TABS: ORAL_TABLET | ORAL | 8 refills | Status: DC

## 2022-09-18 NOTE — Patient Instructions (Addendum)
Thank you for coming in today  Labs were done today, if any labs are abnormal the clinic will call you No news is good news  Medications: Change torsemide to 20 mg every other day alternating with 40 mg   Follow up appointments:   Your physician recommends that you return for lab work in:  10 days   Your physician recommends that you schedule a follow-up appointment in:  3 months with Dr. [name]    Do the following things EVERYDAY: Weigh yourself in the morning before breakfast. Write it down and keep it in a log. Take your medicines as prescribed Eat low salt foods--Limit salt (sodium) to 2000 mg per day.  Stay as active as you can everyday Limit all fluids for the day to less than 2 liters   At the [facility], you and your health needs are our priority. As part of our continuing mission to provide you with exceptional heart care, we have created designated Provider Care Teams. These Care Teams include your primary Cardiologist (physician) and Advanced Practice Providers (APPs- Physician Assistants and Nurse Practitioners) who all work together to provide you with the care you need, when you need it.   You may see any of the following providers on your designated Care Team at your next follow up: Dr Glori Bickers Dr [name] Dr. Roxana Hires, NP Lyda Jester, [place] [facility], [place] Forestine Na, NP Audry Riles, PharmD   Please be sure to bring in all your medications bottles to every appointment.    Thank you for choosing [facility]  If you have any questions or concerns before your next appointment please send [place] a message through [place] or call our office at [phone].    TO LEAVE A MESSAGE FOR THE NURSE SELECT OPTION 2, PLEASE LEAVE A MESSAGE INCLUDING: YOUR NAME DATE OF BIRTH CALL BACK NUMBER REASON FOR CALL**this is important as we prioritize the call  backs  YOU WILL RECEIVE A CALL BACK THE SAME DAY AS LONG AS YOU CALL BEFORE 4:00 PM

## 2022-09-27 ENCOUNTER — Encounter: Payer: Self-pay | Admitting: Cardiology

## 2022-09-28 ENCOUNTER — Ambulatory Visit (HOSPITAL_COMMUNITY)
Admission: RE | Admit: 2022-09-28 | Discharge: 2022-09-28 | Disposition: A | Discharge status: Home / Self Care | Payer: PPO | Source: Ambulatory Visit | Attending: Internal Medicine | Admitting: Internal Medicine

## 2022-09-28 DIAGNOSIS — I5032 Chronic diastolic (congestive) heart failure: Secondary | ICD-10-CM | POA: Diagnosis not present

## 2022-09-28 LAB — BASIC METABOLIC PANEL
Anion gap: 10 (ref 5–15)
BUN: 31 mg/dL — ABNORMAL HIGH (ref 8–23)
CO2: 29 mmol/L (ref 22–32)
Calcium: 9.5 mg/dL (ref 8.9–10.3)
Chloride: 96 mmol/L — ABNORMAL LOW (ref 98–111)
Creatinine, Ser: 1.71 mg/dL — ABNORMAL HIGH (ref 0.44–1.00)
GFR, Estimated: 28 mL/min — ABNORMAL LOW (ref >60)
Glucose, Bld: 162 mg/dL — ABNORMAL HIGH (ref 70–99)
Potassium: 4.3 mmol/L (ref 3.5–5.1)
Sodium: 135 mmol/L (ref 135–145)

## 2022-09-28 LAB — BRAIN NATRIURETIC PEPTIDE: B Natriuretic Peptide: 760.1 pg/mL — ABNORMAL HIGH (ref 0.0–100.0)

## 2022-09-28 NOTE — Progress Notes (Signed)
Remote pacemaker transmission.   

## 2022-10-02 ENCOUNTER — Telehealth: Payer: Self-pay

## 2022-10-02 NOTE — Telephone Encounter (Signed)
Spoke with patient and ICM intro given.  Patient is agreeable to ICM monthly follow up.  Advised transmission will send automatically between 12 Midnight and 6:00 AM if monitor is by bedside.  Explained will call with results after transmission is reviewed.  Provided ICM direct number and explained should call if experiencing any fluid symptoms such as weight gain, shortness of breath or extremity/abdominal swelling. She reports she has lost a lot of fluid and weight has dropped from 132 lbs to 111 lbs.    1st ICM remote transmission scheduled for 10/14/2021.

## 2022-10-02 NOTE — Telephone Encounter (Signed)
Referred to [facility] clinic by Allena Katz, NP following 09/18/2022 OV.   Attempted call to patient for ICM intro and left message for return call.

## 2022-10-02 NOTE — Telephone Encounter (Signed)
-----   Message from Rafael Bihari, [place] sent at 09/18/2022 10:32 AM EDT ----- Regarding: CorVue Leonie Man!  Can you enroll Aubreyana in monthly fluid monitoring?  Thanks! Janett Billow

## 2022-10-08 ENCOUNTER — Telehealth: Payer: Self-pay

## 2022-10-08 ENCOUNTER — Telehealth (HOSPITAL_COMMUNITY): Payer: Self-pay | Admitting: Cardiology

## 2022-10-08 NOTE — Telephone Encounter (Signed)
Phone: [phone] Judie Petit) ]  LMOM

## 2022-10-08 NOTE — Telephone Encounter (Signed)
Patient called regarding recent weight loss over the last a couple of months but more specifically over the last 2 week. Her weight was 111 lbs on 4/2 and today, 4/8, she is 111 lbs.  She is unsure why she is losing weight because she has a good appetite.  Assisted with sending manual remote transmission.  10/08/2022 Corvue thoracic impedance suggesting possible dryness from 3/29-4/5 followed by possible fluid accumulation starting 4/6.    09/28/2022 BMET results show increase inCreatinine which correlates with possible dryness on 3/29.      Pt attended birthday party/reunion over the weekend eating foods high in salt which may contribute to fluid retention.    Confirmed she is taking Torsemide 40 mg every other day alternating with 20 mg every other day.  She is due to take the 40 mg dosage today.    Advised to follow low salt diet and she restricts fluid intake to <64 oz daily.  She is feeling fine but was just concerned with weight loss. Advised the possible dryness may account for weight loss over the last 2 weeks.  She reports she is eating well.  No changes today and advised will recheck fluid levels on Monday, 4/15.  Encouraged to call back if she develops any fluid symptoms.    Copy sent to Prince Rome, NP at [facility] clinic for [facility] and review.

## 2022-10-08 NOTE — Telephone Encounter (Signed)
Patient called to report decrease in weight Weight normally 114 Weight today 107  Denies swelling, dizziness, or fatigue Denies recent medication changes  Torsemide 40 mg daily alternating with 20 mg daily   Please advise

## 2022-10-08 NOTE — Telephone Encounter (Signed)
Would arrange for BMET, otherwise no changes

## 2022-10-09 ENCOUNTER — Other Ambulatory Visit (HOSPITAL_COMMUNITY): Payer: Self-pay

## 2022-10-09 DIAGNOSIS — I5032 Chronic diastolic (congestive) heart failure: Secondary | ICD-10-CM

## 2022-10-09 NOTE — Telephone Encounter (Signed)
I spoke to patient and we ordered labs and scheduled for Thursday the 11th.

## 2022-10-11 ENCOUNTER — Other Ambulatory Visit (HOSPITAL_COMMUNITY): Payer: PPO

## 2022-10-12 ENCOUNTER — Ambulatory Visit (HOSPITAL_COMMUNITY)
Admission: RE | Admit: 2022-10-12 | Discharge: 2022-10-12 | Disposition: A | Discharge status: Home / Self Care | Payer: PPO | Source: Ambulatory Visit | Attending: Cardiology | Admitting: Cardiology

## 2022-10-12 DIAGNOSIS — I5032 Chronic diastolic (congestive) heart failure: Secondary | ICD-10-CM | POA: Diagnosis not present

## 2022-10-12 LAB — BASIC METABOLIC PANEL
Anion gap: 12 (ref 5–15)
BUN: 50 mg/dL — ABNORMAL HIGH (ref 8–23)
CO2: 26 mmol/L (ref 22–32)
Calcium: 9.2 mg/dL (ref 8.9–10.3)
Chloride: 94 mmol/L — ABNORMAL LOW (ref 98–111)
Creatinine, Ser: 1.66 mg/dL — ABNORMAL HIGH (ref 0.44–1.00)
GFR, Estimated: 29 mL/min — ABNORMAL LOW (ref >60)
Glucose, Bld: 201 mg/dL — ABNORMAL HIGH (ref 70–99)
Potassium: 3.9 mmol/L (ref 3.5–5.1)
Sodium: 132 mmol/L — ABNORMAL LOW (ref 135–145)

## 2022-10-15 ENCOUNTER — Ambulatory Visit: Payer: PPO | Attending: Internal Medicine

## 2022-10-15 ENCOUNTER — Telehealth: Payer: Self-pay

## 2022-10-15 DIAGNOSIS — I5032 Chronic diastolic (congestive) heart failure: Secondary | ICD-10-CM

## 2022-10-15 DIAGNOSIS — Z950 Presence of cardiac pacemaker: Secondary | ICD-10-CM | POA: Diagnosis not present

## 2022-10-15 NOTE — Progress Notes (Signed)
  Received: Today [place], Anderson Malta, FNP  Cher Egnor, Josephine Igo, RN No change for right now. Thanks Gabriel Conry!

## 2022-10-15 NOTE — Progress Notes (Signed)
Spoke with patient and heart failure questions reviewed.  Transmission results reviewed.  Pt reports she may be eating some foods high in salt and will review food labels to determine which foods may contain high salt.  She does not feel like she is retaining any fluid and does not have any symptoms.    She is compliant with taking Torsemide dosages as prescribed.   Recommendation to limit salt intake to 2000 mg daily and fluid intake to 64 oz daily.  Encouraged to call if experiencing any fluid symptoms.   Copy sent to Prince Rome, NP for review and recommendations if needed.

## 2022-10-15 NOTE — Telephone Encounter (Signed)
Remote ICM transmission received.  Attempted call to patient regarding ICM remote transmission and left message to return call   

## 2022-10-15 NOTE — Progress Notes (Signed)
EPIC Encounter for ICM Monitoring  Patient Name: Shannon [name] is a 89 y.o. female Date: 10/15/2022 Primary Care Physican: Gaspar Garbe, MD Primary Cardiologist: N[facility]/[facility] Electrophysiologist: Rae Roam Pacing: >99%  10/08/2022 Weight: 107 lbs  10/15/2022 Weight: 106.4 lbs       1st ICM remote transmission. Attempted call to patient and unable to reach.  Left message to return. Transmission reviewed.    CorVue thoracic impedance suggesting possible fluid accumulation starting 4/6 but starting to trending back toward baseline.   Prescribed:  Torsemide 20 mg take 2 tablet(s) (40 mg total) by mouth every other day alternating with 1 tablet (20 mg total) every other day. Spironolactone 25 mg take 1 tablet daily x 90 days  Recommendations:  Unable to reach.  Will send copy sent to Prince Rome, NP for review and recommendations if patient is reached.  Follow-up plan: ICM clinic phone appointment on 10/22/2022 to recheck fluid levels.   91 day device clinic remote transmission 11/30/2022.    EP/Cardiology Office Visits: Recall 12/17/2022 with Dr Shirlee Latch.  Recall 09/07/2022 with Dr Elease Hashimoto.  Recall 03/09/2023 with Otilio Saber, PA.    Copy of ICM check sent to Dr. Ladona Ridgel.   3 month ICM trend: 10/15/2022.    12-14 Month ICM trend:     Karie Soda, RN 10/15/2022 9:41 AM

## 2022-10-22 ENCOUNTER — Telehealth: Payer: Self-pay

## 2022-10-22 ENCOUNTER — Ambulatory Visit: Payer: PPO | Attending: Internal Medicine

## 2022-10-22 DIAGNOSIS — Z950 Presence of cardiac pacemaker: Secondary | ICD-10-CM

## 2022-10-22 DIAGNOSIS — I5022 Chronic systolic (congestive) heart failure: Secondary | ICD-10-CM

## 2022-10-22 NOTE — Telephone Encounter (Signed)
Remote ICM transmission received.  Attempted call to patient regarding ICM remote transmission and left message per DPR to return call.   

## 2022-10-22 NOTE — Progress Notes (Signed)
EPIC Encounter for ICM Monitoring  Patient Name: Shannon [name] is a 89 y.o. female Date: 10/22/2022 Primary Care Physican: Gaspar Garbe, MD Primary Cardiologist: N[facility]ser/[facility] Electrophysiologist: Rae Roam Pacing: >99%          10/08/2022 Weight: 107 lbs  10/15/2022 Weight: 106.4 lbs                                                            Attempted call to patient and unable to reach.  Left message to return call. Transmission reviewed.    CorVue thoracic impedance suggesting fluid levels returned to baseline.    Prescribed:  Torsemide 20 mg take 2 tablet(s) (40 mg total) by mouth every other day alternating with 1 tablet (20 mg total) every other day. Spironolactone 25 mg take 1 tablet daily x 90 days   Recommendations:  Unable to reach.     Follow-up plan: ICM clinic phone appointment on 11/19/2022   91 day device clinic remote transmission 11/30/2022.     EP/Cardiology Office Visits: Recall 12/17/2022 with Dr Shirlee Latch.  Recall 09/07/2022 with Dr Elease Hashimoto.  Recall 03/09/2023 with Otilio Saber, PA.     Copy of ICM check sent to Dr. Ladona Ridgel.   3 month ICM trend: 10/20/2022.     Karie Soda, RN 10/22/2022 7:58 AM

## 2022-10-26 NOTE — Progress Notes (Signed)
Spoke with patient and heart failure questions reviewed.  Transmission results reviewed and advised fluid levels returned to normal.  Pt asymptomatic for fluid accumulation.  Reports feeling well at this time and voices no complaints.  No changes and encouraged to call if experiencing any fluid symptoms.

## 2022-11-11 ENCOUNTER — Other Ambulatory Visit: Payer: Self-pay | Admitting: Cardiovascular Disease

## 2022-11-19 ENCOUNTER — Ambulatory Visit: Payer: PPO | Attending: Internal Medicine

## 2022-11-19 DIAGNOSIS — I5022 Chronic systolic (congestive) heart failure: Secondary | ICD-10-CM

## 2022-11-19 DIAGNOSIS — Z950 Presence of cardiac pacemaker: Secondary | ICD-10-CM | POA: Diagnosis not present

## 2022-11-19 NOTE — Progress Notes (Signed)
EPIC Encounter for ICM Monitoring  Patient Name: Shannon [name] is a 89 y.o. female Date: 11/19/2022 Primary Care Physican: Tisovec, Adelfa Koh, MD Primary Cardiologist: Nahser/McLean Electrophysiologist: Rae Roam Pacing: >99%          10/08/2022 Weight: 107 lbs  10/15/2022 Weight: 106.4 lbs 11/19/2022 Weight: 101.5 lbs                                                            Spoke with patient and heart failure questions reviewed.  Transmission results reviewed.  Pt asymptomatic for fluid accumulation.  Reports feeling well at this time and voices no complaints.  She reports she may have been eating foods that were high in salt.    CorVue thoracic impedance suggesting possible fluid accumulation starting 5/8 and also 5/2-5/7 but trending close back to baseline.   Suggesting possible dryness from 4/28-5/1.    Prescribed:  Torsemide 20 mg take 2 tablet(s) (40 mg total) by mouth every other day alternating with 1 tablet (20 mg total) every other day. Spironolactone 25 mg take 1 tablet daily x 90 days starting 08/21/2022   Labs: 10/12/2022 Creatinine 1.66, BUN 50, Potassium 3.9, Sodium 132, GFR 29  09/28/2022 Creatinine 1.71, BUN 31, Potassium 4.3, Sodium 135, GFR 28  09/07/2022 Creatinine 1.54, BUN 27, Potassium 4.6, Sodium 136, GFR 32  08/28/2022 Creatinine 1.45, BUN 25, Potassium 3.7, Sodium 135, GFR 35 A complete set of results can be found in Results Review.  Recommendations:  Recommendation to limit salt intake to 2000 mg daily and fluid intake to 64 oz daily.  Encouraged to call if experiencing any fluid symptoms.    Follow-up plan: ICM clinic phone appointment on 11/27/2022 to recheck fluid levels.   91 day device clinic remote transmission 11/30/2022.     EP/Cardiology Office Visits:  Recall 12/17/2022 with Dr Shirlee Latch.  Recall 09/07/2022 with Dr Elease Hashimoto (yearly).  Recall 03/09/2023 with Otilio Saber, PA.     Copy of ICM check sent to Dr. Ladona Ridgel.   3 month ICM trend:  11/19/2022.    12-14 Month ICM trend:     Karie Soda, RN 11/19/2022 8:32 AM

## 2022-11-22 DIAGNOSIS — I1310 Hypertensive heart and chronic kidney disease without heart failure, with stage 1 through stage 4 chronic kidney disease, or unspecified chronic kidney disease: Secondary | ICD-10-CM | POA: Diagnosis not present

## 2022-11-22 DIAGNOSIS — I482 Chronic atrial fibrillation, unspecified: Secondary | ICD-10-CM | POA: Diagnosis not present

## 2022-11-22 DIAGNOSIS — I2510 Atherosclerotic heart disease of native coronary artery without angina pectoris: Secondary | ICD-10-CM | POA: Diagnosis not present

## 2022-11-22 DIAGNOSIS — N184 Chronic kidney disease, stage 4 (severe): Secondary | ICD-10-CM | POA: Diagnosis not present

## 2022-11-22 DIAGNOSIS — E1140 Type 2 diabetes mellitus with diabetic neuropathy, unspecified: Secondary | ICD-10-CM | POA: Diagnosis not present

## 2022-11-22 DIAGNOSIS — Z7901 Long term (current) use of anticoagulants: Secondary | ICD-10-CM | POA: Diagnosis not present

## 2022-11-22 DIAGNOSIS — I87319 Chronic venous hypertension (idiopathic) with ulcer of unspecified lower extremity: Secondary | ICD-10-CM | POA: Diagnosis not present

## 2022-11-22 DIAGNOSIS — I509 Heart failure, unspecified: Secondary | ICD-10-CM | POA: Diagnosis not present

## 2022-11-22 DIAGNOSIS — D638 Anemia in other chronic diseases classified elsewhere: Secondary | ICD-10-CM | POA: Diagnosis not present

## 2022-11-22 DIAGNOSIS — I209 Angina pectoris, unspecified: Secondary | ICD-10-CM | POA: Diagnosis not present

## 2022-11-22 DIAGNOSIS — I739 Peripheral vascular disease, unspecified: Secondary | ICD-10-CM | POA: Diagnosis not present

## 2022-11-22 DIAGNOSIS — E1151 Type 2 diabetes mellitus with diabetic peripheral angiopathy without gangrene: Secondary | ICD-10-CM | POA: Diagnosis not present

## 2022-11-27 ENCOUNTER — Ambulatory Visit: Payer: PPO | Attending: Internal Medicine

## 2022-11-27 DIAGNOSIS — Z950 Presence of cardiac pacemaker: Secondary | ICD-10-CM

## 2022-11-27 DIAGNOSIS — I5022 Chronic systolic (congestive) heart failure: Secondary | ICD-10-CM

## 2022-11-28 ENCOUNTER — Telehealth (HOSPITAL_COMMUNITY): Payer: Self-pay

## 2022-11-28 NOTE — Telephone Encounter (Signed)
Patient called and stated she misplaced her Eliquis and is out. She asked for samples to get her through until she can fill again.  Medication Samples have been provided to the patient.  Drug name: Eliquis       Strength: 2.5mg         Qty: 4  LOT: [id number]  Exp.Date: 4/25  Dosing instructions: Take 1 tablet twice daily  The patient has been instructed regarding the correct time, dose, and frequency of taking this medication, including desired effects and most common side effects.   Shannon [name] 9:52 AM 11/28/2022   Patient notified samples up front.

## 2022-11-30 ENCOUNTER — Telehealth: Payer: Self-pay

## 2022-11-30 ENCOUNTER — Ambulatory Visit (INDEPENDENT_AMBULATORY_CARE_PROVIDER_SITE_OTHER): Payer: PPO

## 2022-11-30 DIAGNOSIS — I428 Other cardiomyopathies: Secondary | ICD-10-CM

## 2022-11-30 LAB — CUP PACEART REMOTE DEVICE CHECK
Battery Remaining Longevity: 38 mo
Battery Remaining Percentage: 81 %
Battery Voltage: 2.98 V
Date Time Interrogation Session: 20240529134543
Implantable Lead Connection Status: 753985
Implantable Lead Connection Status: 753985
Implantable Lead Connection Status: 753985
Implantable Lead Implant Date: 20180329
Implantable Lead Implant Date: 20180329
Implantable Lead Implant Date: 20180329
Implantable Lead Location: 753859
Implantable Lead Location: 753860
Implantable Lead Location: 753860
Implantable Lead Model: 3830
Implantable Lead Model: 5076
Implantable Pulse Generator Implant Date: 20230530
Lead Channel Impedance Value: 290 Ohm
Lead Channel Impedance Value: 360 Ohm
Lead Channel Pacing Threshold Amplitude: 1 V
Lead Channel Pacing Threshold Amplitude: 2.5 V
Lead Channel Pacing Threshold Pulse Width: 0.5 ms
Lead Channel Pacing Threshold Pulse Width: 1 ms
Lead Channel Sensing Intrinsic Amplitude: 5 mV
Lead Channel Setting Pacing Amplitude: 2 V
Lead Channel Setting Pacing Amplitude: 3.5 V
Lead Channel Setting Pacing Pulse Width: 0.5 ms
Lead Channel Setting Pacing Pulse Width: 1 ms
Lead Channel Setting Sensing Sensitivity: 4 mV
Pulse Gen Model: 3222
Pulse Gen Serial Number: 8080981

## 2022-11-30 NOTE — Telephone Encounter (Signed)
Remote ICM transmission received.  Attempted call to patient regarding ICM remote transmission and mail box is full. 

## 2022-11-30 NOTE — Progress Notes (Signed)
EPIC Encounter for ICM Monitoring  Patient Name: Shannon [name] is a 89 y.o. female Date: 11/30/2022 Primary Care Physican: Tisovec, Adelfa Koh, MD Primary Cardiologist: Nahser/McLean Electrophysiologist: Rae Roam Pacing: >99%          10/08/2022 Weight: 107 lbs  10/15/2022 Weight: 106.4 lbs 11/19/2022 Weight: 101.5 lbs                                                            Attempted call to patient and unable to reach.   Transmission reviewed.    CorVue thoracic impedance suggesting fluid levels returned to normal.    Prescribed:  Torsemide 20 mg take 2 tablet(s) (40 mg total) by mouth every other day alternating with 1 tablet (20 mg total) every other day. Spironolactone 25 mg take 1 tablet daily x 90 days starting 08/21/2022   Labs: 10/12/2022 Creatinine 1.66, BUN 50, Potassium 3.9, Sodium 132, GFR 29  09/28/2022 Creatinine 1.71, BUN 31, Potassium 4.3, Sodium 135, GFR 28  09/07/2022 Creatinine 1.54, BUN 27, Potassium 4.6, Sodium 136, GFR 32  08/28/2022 Creatinine 1.45, BUN 25, Potassium 3.7, Sodium 135, GFR 35 A complete set of results can be found in Results Review.   Recommendations:  Unable to reach.     Follow-up plan: ICM clinic phone appointment on 12/24/2022.   91 day device clinic remote transmission 03/01/2023.     EP/Cardiology Office Visits:  Recall 12/17/2022 with Dr Shirlee Latch.  Recall 09/07/2022 with Dr Elease Hashimoto (yearly).  Recall 03/09/2023 with Otilio Saber, PA.     Copy of ICM check sent to Dr. Ladona Ridgel.   3 month ICM trend: 11/28/2022.    12-14 Month ICM trend:     Karie Soda, RN 11/30/2022 2:15 PM

## 2022-12-05 ENCOUNTER — Other Ambulatory Visit: Payer: Self-pay | Admitting: Cardiovascular Disease

## 2022-12-09 ENCOUNTER — Other Ambulatory Visit: Payer: Self-pay | Admitting: Student

## 2022-12-09 ENCOUNTER — Other Ambulatory Visit: Payer: Self-pay | Admitting: Cardiovascular Disease

## 2022-12-18 NOTE — Progress Notes (Signed)
Remote pacemaker transmission.   

## 2022-12-24 ENCOUNTER — Other Ambulatory Visit (HOSPITAL_COMMUNITY): Payer: Self-pay | Admitting: Cardiology

## 2022-12-24 ENCOUNTER — Ambulatory Visit: Payer: PPO | Attending: Internal Medicine

## 2022-12-24 DIAGNOSIS — Z950 Presence of cardiac pacemaker: Secondary | ICD-10-CM

## 2022-12-24 DIAGNOSIS — I5022 Chronic systolic (congestive) heart failure: Secondary | ICD-10-CM | POA: Diagnosis not present

## 2022-12-24 MED ORDER — ATORVASTATIN CALCIUM 20 MG PO TABS: ORAL_TABLET | ORAL | 11 refills | Status: DC

## 2022-12-25 ENCOUNTER — Ambulatory Visit: Payer: PPO | Admitting: Podiatry

## 2022-12-25 DIAGNOSIS — M79675 Pain in left toe(s): Secondary | ICD-10-CM | POA: Diagnosis not present

## 2022-12-25 DIAGNOSIS — B351 Tinea unguium: Secondary | ICD-10-CM | POA: Diagnosis not present

## 2022-12-25 DIAGNOSIS — M79674 Pain in right toe(s): Secondary | ICD-10-CM

## 2022-12-25 DIAGNOSIS — S90112A Contusion of left great toe without damage to nail, initial encounter: Secondary | ICD-10-CM | POA: Diagnosis not present

## 2022-12-25 NOTE — Progress Notes (Signed)
Subjective:  Patient ID: Shannon [name], female    DOB: 01-01-1932,  MRN: [id number]   BRENLYN BESHARA presents to clinic today for:  Chief Complaint  Patient presents with   Nail Problem    Pt is here for [facility] and because her great toe on her left foot has discoloration  . Patient notes nails are thick, discolored, elongated and painful in shoegear when trying to ambulate.  Patient's son is with her today however he did not want to watch the direct care of her foot, noting that he cannot stand the side of blood on other people.  The patient is unsure what caused the discoloration to the distal aspect of the left great toe.  She did have previous procedure on the distal aspect of the great toe and does not have a toenail on this toe.  She has what appears to be a large bruise or blood blister on the distal aspect of the left great toe.  She is in a wheelchair today.  She did not transfer to exam chair  PCP is Tisovec, Adelfa Koh, MD.  Allergies  Allergen Reactions   Amiodarone Anaphylaxis   Ranexa [Ranolazine] Nausea And Vomiting   Other Other (See Comments)    Vitamin E - causes heart rate to increase   Demerol Other (See Comments)    Burning sensation all over body   Erythromycin Nausea And Vomiting   Pecan Nut (Diagnostic) Diarrhea   Penicillins Swelling   Percocet [Oxycodone-Acetaminophen] Swelling   Tomato Rash    In large amounts     Review of Systems: Negative except as noted in the HPI.  Objective:  There were no vitals filed for this visit.  CATHIE BONNELL is a pleasant 89 y.o. female in NAD. AAO x 3.  Vascular Examination: Capillary refill time is delayed bilateral.  Trace palpable pedal pulses b/l LE. Digital hair sparse b/l. No pedal edema b/l. Skin temperature gradient WNL b/l. No cyanosis or clubbing noted b/l.   Dermatological Examination: Pedal skin with decreased turgor, texture and tone b/l.  No interdigital macerations b/l. Toenails x9 are  3mm thick, discolored, dystrophic with subungual debris. There is pain with compression of the nail plates.  They are elongated x9.  There is a subepidermal hematoma on the distal aspect of the left great toe.  No surrounding erythema is noted.  No purulence is noted.  Upon attempted drainage of the hematoma the blood is thicker and gelatinous, indicating this has been present for at least a couple weeks.  Assessment/Plan: 1. Hematoma of left great toe   2. Pain due to onychomycosis of toenails of both feet     The mycotic toenails were sharply debrided x 9 with sterile nail nippers and a power debriding burr to decrease bulk/thickness and length.    The left hallux hematoma was incised with a sterile #313 blade and some of the gelatinous blood was able to be expressed.  A small portion of the hematoma was deroofed to allow for excavation of the gelatinous blood.  The remainder of the roof was left intact to act as a barrier.  The toe was dressed with iodine ointment and a dry sterile dressing.  They stated they had been told by previous doctor to not perform Epsom salt soaks, so we will avoid that at this time.  Patient will remove the dressing for bathing but then can reapply Betadine solution via a cotton ball to the tip of the  toe followed by a light gauze dressing for padding protection and barrier to bacteria.  They will call if any signs of infection do develop.  Follow-up within the next 2 weeks for recheck.  Follow-up in 3 months for at risk footcare.  Return in about 2 weeks (around 01/08/2023) for recheck hematoma left hallux.   Clerance Lav, DPM, FACFAS [facility]     [address] [place], [place] [address]                Office [phone]  Fax [phone]

## 2022-12-26 NOTE — Progress Notes (Signed)
Spoke with patient and heart failure questions reviewed.  Transmission results reviewed.  Pt asymptomatic for fluid accumulation.  Reports feeling well at this time and voices no complaints.  She has been making changes her eating habits.   Weight: 103.5 lbs.    No changes and encouraged to call if experiencing any fluid symptoms.

## 2022-12-26 NOTE — Progress Notes (Signed)
EPIC Encounter for ICM Monitoring  Patient Name: Shannon [name] is a 89 y.o. female Date: 12/26/2022 Primary Care Physican: Gaspar Garbe, MD Primary Cardiologist: Nahser/McLean Electrophysiologist: Rae Roam Pacing: >99%          10/08/2022 Weight: 107 lbs  10/15/2022 Weight: 106.4 lbs 11/19/2022 Weight: 101.5 lbs                                                            Transmission reviewed.    CorVue thoracic impedance suggesting fluid levels returned to normal.    Prescribed:  Torsemide 20 mg take 2 tablet(s) (40 mg total) by mouth every other day alternating with 1 tablet (20 mg total) every other day. Spironolactone 25 mg take 1 tablet daily x 90 days starting 08/21/2022   Labs: 10/12/2022 Creatinine 1.66, BUN 50, Potassium 3.9, Sodium 132, GFR 29  09/28/2022 Creatinine 1.71, BUN 31, Potassium 4.3, Sodium 135, GFR 28  09/07/2022 Creatinine 1.54, BUN 27, Potassium 4.6, Sodium 136, GFR 32  08/28/2022 Creatinine 1.45, BUN 25, Potassium 3.7, Sodium 135, GFR 35 A complete set of results can be found in Results Review.   Recommendations:  No changes.    Follow-up plan: ICM clinic phone appointment on 01/28/2023.   91 day device clinic remote transmission 03/01/2023.     EP/Cardiology Office Visits:  Recall 12/17/2022 with Dr Shirlee Latch.  Recall 09/07/2022 with Dr Elease Hashimoto (yearly).  Recall 03/09/2023 with Otilio Saber, PA.     Copy of ICM check sent to Dr. Ladona Ridgel.   3 month ICM trend: 12/22/2022.    Karie Soda, RN 12/26/2022 12:22 PM

## 2023-01-07 ENCOUNTER — Telehealth: Payer: Self-pay

## 2023-01-07 NOTE — Telephone Encounter (Signed)
Received call from patient and asked about the next remote transmission date to check fluids and advised will be 7/29.  She is feeling fine and sees Dr Shirlee Latch tomorrow.

## 2023-01-08 ENCOUNTER — Encounter (HOSPITAL_COMMUNITY): Payer: Self-pay | Admitting: Cardiology

## 2023-01-08 ENCOUNTER — Ambulatory Visit (HOSPITAL_COMMUNITY)
Admission: RE | Admit: 2023-01-08 | Discharge: 2023-01-08 | Disposition: A | Discharge status: Home / Self Care | Payer: PPO | Source: Ambulatory Visit | Attending: Cardiology | Admitting: Cardiology

## 2023-01-08 VITALS — BP 100/60 | HR 60 | Wt 107.2 lb

## 2023-01-08 DIAGNOSIS — R5383 Other fatigue: Secondary | ICD-10-CM | POA: Diagnosis not present

## 2023-01-08 DIAGNOSIS — I428 Other cardiomyopathies: Secondary | ICD-10-CM | POA: Insufficient documentation

## 2023-01-08 DIAGNOSIS — I2510 Atherosclerotic heart disease of native coronary artery without angina pectoris: Secondary | ICD-10-CM | POA: Insufficient documentation

## 2023-01-08 DIAGNOSIS — Z955 Presence of coronary angioplasty implant and graft: Secondary | ICD-10-CM | POA: Diagnosis not present

## 2023-01-08 DIAGNOSIS — I480 Paroxysmal atrial fibrillation: Secondary | ICD-10-CM | POA: Insufficient documentation

## 2023-01-08 DIAGNOSIS — E1122 Type 2 diabetes mellitus with diabetic chronic kidney disease: Secondary | ICD-10-CM | POA: Insufficient documentation

## 2023-01-08 DIAGNOSIS — I4892 Unspecified atrial flutter: Secondary | ICD-10-CM | POA: Insufficient documentation

## 2023-01-08 DIAGNOSIS — Z8673 Personal history of transient ischemic attack (TIA), and cerebral infarction without residual deficits: Secondary | ICD-10-CM | POA: Insufficient documentation

## 2023-01-08 DIAGNOSIS — R42 Dizziness and giddiness: Secondary | ICD-10-CM | POA: Insufficient documentation

## 2023-01-08 DIAGNOSIS — E785 Hyperlipidemia, unspecified: Secondary | ICD-10-CM | POA: Insufficient documentation

## 2023-01-08 DIAGNOSIS — Z950 Presence of cardiac pacemaker: Secondary | ICD-10-CM | POA: Diagnosis not present

## 2023-01-08 DIAGNOSIS — Z7901 Long term (current) use of anticoagulants: Secondary | ICD-10-CM | POA: Insufficient documentation

## 2023-01-08 DIAGNOSIS — I5022 Chronic systolic (congestive) heart failure: Secondary | ICD-10-CM | POA: Insufficient documentation

## 2023-01-08 DIAGNOSIS — Z87891 Personal history of nicotine dependence: Secondary | ICD-10-CM | POA: Diagnosis not present

## 2023-01-08 DIAGNOSIS — N1832 Chronic kidney disease, stage 3b: Secondary | ICD-10-CM | POA: Insufficient documentation

## 2023-01-08 DIAGNOSIS — Z79899 Other long term (current) drug therapy: Secondary | ICD-10-CM | POA: Diagnosis not present

## 2023-01-08 DIAGNOSIS — Z7984 Long term (current) use of oral hypoglycemic drugs: Secondary | ICD-10-CM | POA: Diagnosis not present

## 2023-01-08 DIAGNOSIS — Z8249 Family history of ischemic heart disease and other diseases of the circulatory system: Secondary | ICD-10-CM | POA: Diagnosis not present

## 2023-01-08 DIAGNOSIS — Z823 Family history of stroke: Secondary | ICD-10-CM | POA: Insufficient documentation

## 2023-01-08 DIAGNOSIS — I5032 Chronic diastolic (congestive) heart failure: Secondary | ICD-10-CM

## 2023-01-08 LAB — BASIC METABOLIC PANEL
Anion gap: 11 (ref 5–15)
BUN: 38 mg/dL — ABNORMAL HIGH (ref 8–23)
CO2: 25 mmol/L (ref 22–32)
Calcium: 9.2 mg/dL (ref 8.9–10.3)
Chloride: 96 mmol/L — ABNORMAL LOW (ref 98–111)
Creatinine, Ser: 1.68 mg/dL — ABNORMAL HIGH (ref 0.44–1.00)
GFR, Estimated: 29 mL/min — ABNORMAL LOW (ref >60)
Glucose, Bld: 152 mg/dL — ABNORMAL HIGH (ref 70–99)
Potassium: 4.1 mmol/L (ref 3.5–5.1)
Sodium: 132 mmol/L — ABNORMAL LOW (ref 135–145)

## 2023-01-08 LAB — CBC
HCT: 34.7 % — ABNORMAL LOW (ref 36.0–46.0)
Hemoglobin: 11.9 g/dL — ABNORMAL LOW (ref 12.0–15.0)
MCH: 35 pg — ABNORMAL HIGH (ref 26.0–34.0)
MCHC: 34.3 g/dL (ref 30.0–36.0)
MCV: 102.1 fL — ABNORMAL HIGH (ref 80.0–100.0)
Platelets: 181 10*3/uL (ref 150–400)
RBC: 3.4 MIL/uL — ABNORMAL LOW (ref 3.87–5.11)
RDW: 16.6 % — ABNORMAL HIGH (ref 11.5–15.5)
WBC: 8.1 10*3/uL (ref 4.0–10.5)
nRBC: 0 % (ref 0.0–0.2)

## 2023-01-08 MED ORDER — TORSEMIDE 20 MG PO TABS: 20.0000 mg | ORAL_TABLET | Freq: Every day | ORAL | 8 refills | Status: DC

## 2023-01-08 NOTE — Patient Instructions (Signed)
HOLD Torsemide for 2 days, then change to 20 mg ( 1 Tab) daily.  Labs done today, your results will be available in MyChart, we will contact you for abnormal readings.  Your physician recommends that you schedule a follow-up appointment in: 1 month.  If you have any questions or concerns before your next appointment please send [place] a message through [place] or call our office at [phone].    TO LEAVE A MESSAGE FOR THE NURSE SELECT OPTION 2, PLEASE LEAVE A MESSAGE INCLUDING: YOUR NAME DATE OF BIRTH CALL BACK NUMBER REASON FOR CALL**this is important as we prioritize the call backs  YOU WILL RECEIVE A CALL BACK THE SAME DAY AS LONG AS YOU CALL BEFORE 4:00 PM  At [facility], you and your health needs are our priority. As part of our continuing mission to provide you with exceptional heart care, we have created designated Provider Care Teams. These Care Teams include your primary Cardiologist (physician) and Advanced Practice Providers (APPs- Physician Assistants and Nurse Practitioners) who all work together to provide you with the care you need, when you need it.   You may see any of the following providers on your designated Care Team at your next follow up: Dr Arvilla Meres Dr Marca Ancona Dr. Marcos Eke, NP Robbie Lis, [place] [facility] [place], [place] Brynda Peon, NP Karle Plumber, PharmD   Please be sure to bring in all your medications bottles to every appointment.    Thank you for choosing [facility]

## 2023-01-09 NOTE — Progress Notes (Signed)
ID:  Shannon [name], DOB [birth year], MRN [id number]  Provider location: [place] [facility] Type of Visit: Established patient   PCP:  Tisovec, Adelfa Koh, MD  Cardiologist:  Kristeen Miss, MD HF Cardiology: Dr. Shirlee Latch   History of Present Illness: Shannon [name] is a 89 y.o. female who has a history of paroxysmal atrial fibrillation, chronic systolic CHF, and CAD. She apparently had been in persistent atrial fibrillation since around 8/16.  It appears that she was tachycardic most of the time prior to her recent admission in 7/17.  She had been on amiodarone prior to admission but it was not keeping her in NSR.  She was admitted with acute systolic CHF.  EF had dropped to 20-25% in 7/17 from 50-55% in 10/16.  She was in atrial fibrillation with RVR.  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  Amiodarone was stopped due to suspicion for amiodarone-induced lung toxicity (she was seen by pulmonary).  She was eventually cardioverted to NSR.  Ranolazine was started to try to keep her in NSR.  HR was low in the hospital so she has not been on a beta blocker.  She was extensively diuresed and discharged to SNF.  After discharge, she developed persistent nausea.  Eventually, it was found that the nausea was likely from ranolazine, so this medication was stopped and the nausea resolved.     Patient continued to have difficult-to-control atrial fibrillation.  GFR was too low for Tikosyn.  She therefore had AV nodal ablation with St Jude CRT-P device placed in 3/18.  She developed a pocket hematoma and had pocket revision with sub-pectoral placement in 5/18.    Echo in 9/19 showed EF up to 60-65%, moderate MR, moderate TR, PASP 43 mmHg. Echo in 10/20 showed EF 55-60%, moderately decreased RV systolic function with moderate RV enlargement, moderate-severe biatrial enlargement, moderate-severe TR, mild MR.   Echo in 4/22 showed EF 55% with  mild LVH, mild RV enlargement with mildly decreased RV systolic function, severe biatrial enlargement, mild MR, moderate TR.  Echo in 5/23 showed EF 50-55%, moderate RV enlargement with normal systolic function, PASP 42 mmHg, moderate MR, severe TR.   Patient was admitted to the hospital in [facility] in 12/23 with CHF, predominantly RV failure.  Echo there showed EF 45-50%, lateral hypokinesis, severe RV dilation with mildly decreased RV systolic function, severe TR.   Today she returns for HF follow up with her son.  She has been lightheaded standing recently.  She fell recently with no injury.  No loss of consciousness. She uses a walker.  No dyspnea walking around the house but fatigues easily.  No chest pain.  Weight is down 17 lbs.    ECG (personally reviewed): Atrial fibrillation, BiV pacing  St Jude device interrogation: stable thoracic impedance, >99% BiV pacing.    Labs (7/17): Digoxin 1.6 => 0.3, K 4.6, creatinine 0.87 => 0.76, HCT 34, BNP 265 Labs (9/17): K 5, creatinine 1.36 Labs (11/17): K 4.9, creatinine 1.29, hgb 9.2, digoxin 0.5 Labs (12/17): hgb 10.4, digoxin 0.4 Labs (1/18): K 4, creatinine 1.62 => 1.49, hgb 9.5, LDL 42, HDL 63 Labs (3/18): K 4.1, creatinine 1.44, digoxin 0.4 Labs (6/18): K 3.9, creatinine 1.7, hgb 11.5, LDL 56, HDL 50 Labs (6/19): K 4.4, creatinine 1.7, hgb 12.8, LDL 54 Labs (10/19): K 4.2, creatinine 1.46 Labs (6/21): K 4.6, creatinine 1.5, LDL 43, HDL 51 Labs (3/22): BNP 635, LFTs normal,  K 3.8, creatinine 1.81 Labs (5/23): K 4.3, creatinine 1.63 => 1.67 Labs (9/23): K 4.0, creatinine 2.11 Labs (11/23): K 5.1, creatinine 1.74 Labs (1/24): BNP 704, K 3.3, creatinine 2.54 => 1.68 Labs (4/24): K 3.9, creatinine 1.66    PMH: 1. Atrial fibrillation: Paroxysmal.  Had been persistent since 8/16, was cardioverted to NSR during 7/17 admission.  Possible atypical atrial flutter on 12/17 ECG.  By 3/18, atrial fibrillation again.  - Nausea with ranolazine used  for atrial fibrillation suppression.  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  - AV nodal ablation 3/18 with CRT-P.  2. 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  She had been in atrial fibrillation with elevated rate possibly for months prior to 7/17 admission.  - Echo (10/16): EF 50-55%.  - Echo (7/17): EF 20-25%, mildly decreased RV systolic function, severe TR, PA systolic pressure 64 mmHg.  - LHC/RHC (7/17): Patent OM stent, no obstructive CAD.  Mean RA 13, PA 45/25, mean PCWP 16, CI 2.36.  - Echo (1/18): EF 30-35% with diffuse hypokinesis, mildly decreased RV systolic function, moderate TR and MR, PASP 53 mmHg, severe LAE.  - St Jude CRT-P placed with AV nodal ablation in 3/18.  - Echo (9/19): EF 60-65%, moderate LVH, grade 3 diastolic dysfunction, mild MR, mild AI, moderate TR, PASP 43 mmHg.  - Echo (10/20): EF 55-60%, moderately decreased RV systolic function with moderate RV enlargement, moderate-severe biatrial enlargement, moderate-severe TR, mild MR.  - Echo (4/22): EF 55% with mild LVH, mild RV enlargement with mildly decreased RV systolic function, severe biatrial enlargement, mild MR, moderate TR.  - Echo (5/23): EF 50-55%, moderate RV enlargement with normal systolic function, PASP 42 mmHg, moderate MR, severe TR.  - Echo (12/23): EF 45-50%, lateral hypokinesis, severe RV dilation with mildly decreased RV systolic function, severe TR.  3. CAD: DES to OM in 2007.  LHC (7/17) with patent stent and no new obstructive disease.  4. Type II diabetes 5. Hyperlipidemia 6. TIA 7. Possible amiodarone lung toxicity: Chest CT (7/17) with bronchiectasis/fibrosis, patchy bilateral densities.  8. CKD stage 3  Past Surgical History:  Procedure Laterality Date   ATRIAL FLUTTER ABLATION N/A 10/09/2012   Procedure: ATRIAL FLUTTER ABLATION;  Surgeon: Hillis Range, MD;  Location: [facility] CATH LAB;  Service: Cardiovascular;  Laterality: N/A;   AV NODE  ABLATION N/A 09/27/2016   Procedure: AV Node Ablation;  Surgeon: Hillis Range, MD;  Location: [facility] INVASIVE CV LAB;  Service: Cardiovascular;  Laterality: N/A;   BIV PACEMAKER INSERTION CRT-P N/A 09/27/2016   Procedure: BiV Pacemaker Insertion CRT-P;  Surgeon: Hillis Range, MD;  Location: [facility] INVASIVE CV LAB;  Service: Cardiovascular;  Laterality: N/A;   CARDIAC CATHETERIZATION N/A 01/06/2016   Procedure: Right/Left Heart Cath and Coronary Angiography;  Surgeon: Lyn Records, MD;  Location: [facility] INVASIVE CV LAB;  Service: Cardiovascular;  Laterality: N/A;   CARDIOVERSION N/A 01/11/2016   Procedure: CARDIOVERSION;  Surgeon: Laurey Morale, MD;  Location: [facility] ENDOSCOPY;  Service: Cardiovascular;  Laterality: N/A;   cataracts     COLONOSCOPY N/A 10/13/2015   Procedure: COLONOSCOPY;  Surgeon: Ruffin Frederick, MD;  Location: [facility] ENDOSCOPY;  Service: Gastroenterology;  Laterality: N/A;   CORONARY ANGIOPLASTY     Status post PTCA and stenting of the first obtuse marginal-05/13/2006. We placed a 3.0 x 20 mm Taxus stent. It was post dilated using a 3.25 mm noncompliant balloon up to 14 atmospheres   IMPLANT POCKET INCISION & DRAINAGE N/A 11/02/2016   Procedure:  Implant Pocket Incision & Drainage;  Surgeon: Marinus Maw, MD;  Location: [facility] INVASIVE CV LAB;  Service: Cardiovascular;  Laterality: N/A;   PPM GENERATOR CHANGEOUT N/A 11/28/2021   Procedure: PPM GENERATOR CHANGEOUT;  Surgeon: Marinus Maw, MD;  Location: [facility] INVASIVE CV LAB;  Service: Cardiovascular;  Laterality: N/A;   TONSILLECTOMY     Current Outpatient Medications  Medication Sig Dispense Refill   ACCU-CHEK FASTCLIX LANCETS MISC USE TO CHECK BLOOD SUGAR TWICE DAILY  5   ACCU-CHEK SMARTVIEW test strip      atorvastatin (LIPITOR) 20 MG tablet TAKE 1 TABLET BY MOUTH EVERY DAY 30 tablet 11   dapagliflozin propanediol (FARXIGA) 10 MG TABS tablet Take 1 tablet (10 mg total) by mouth daily before breakfast. 30 tablet 11   ELIQUIS 2.5 MG TABS  tablet TAKE 1 TABLET (2.5 MG TOTAL) BY MOUTH 2 (TWO) TIMES DAILY. RESUME 6/4 WITH THE EVENING DOSE 60 tablet 11   gabapentin (NEURONTIN) 100 MG capsule Take 100 mg by mouth daily as needed.     Homeopathic Products (ARNICARE ARTHRITIS) TBDP Take 1 tablet by mouth daily as needed (mouth pain).     loratadine (CLARITIN) 10 MG tablet Take 10 mg by mouth daily as needed for allergies.     metFORMIN (GLUCOPHAGE) 500 MG tablet Take 500 mg by mouth daily.     nitroGLYCERIN (NITROSTAT) 0.4 MG SL tablet Take 1 tablet as needed. 75 tablet 1   OVER THE COUNTER MEDICATION Apply 1 application. topically daily as needed (arthritis). Artrosilium topical cream     OVER THE COUNTER MEDICATION Apply 1 application. topically daily as needed (leg pain). magnilife db foot cream     spironolactone (ALDACTONE) 25 MG tablet 1 tablet Orally once a day for 90 days     torsemide (DEMADEX) 20 MG tablet Take 1 tablet (20 mg total) by mouth daily. 40 mg every other day alternating 20 mg every other day 50 tablet 8   No current facility-administered medications for this encounter.    Allergies:   Amiodarone, Ranexa [ranolazine], Other, Demerol, Erythromycin, Pecan nut (diagnostic), Penicillins, Percocet [oxycodone-acetaminophen], and Tomato   Social History:  The patient  reports that she quit smoking about 59 years ago. Her smoking use included cigarettes. She smoked an average of 1 pack per day. She has never used smokeless tobacco. She reports that she does not drink alcohol and does not use drugs.   Family History:  The patient's family history includes Alzheimer's disease in her sister; Coronary artery disease in her sister; Heart attack in her brother, father, mother, and sister; Stroke in her mother and sister.   ROS:  Please see the history of present illness.   All other systems are personally reviewed and negative.   Recent Labs: 09/28/2022: B Natriuretic Peptide 760.1 01/08/2023: BUN 38; Creatinine, Ser 1.68;  Hemoglobin 11.9; Platelets 181; Potassium 4.1; Sodium 132  Personally reviewed   Wt Readings from Last 3 Encounters:  01/08/23 48.6 kg (107 lb 3.2 oz)  09/18/22 56.3 kg (124 lb 3.2 oz)  08/28/22 59.1 kg (130 lb 6.4 oz)   BP 100/60   Pulse 60   Wt 48.6 kg (107 lb 3.2 oz)   SpO2 99%   BMI 19.61 kg/m  General: NAD, frail Neck: No JVD, no thyromegaly or thyroid nodule.  Lungs: Clear to auscultation bilaterally with normal respiratory effort. CV: Nondisplaced PMI.  Heart regular S1/S2, no S3/S4, 2/6 HSM LLSB.  No peripheral edema.  No carotid bruit.  Normal pedal pulses.  Abdomen: Soft, nontender, no hepatosplenomegaly, no distention.  Skin: Intact without lesions or rashes.  Neurologic: Alert and oriented x 3.  Psych: Normal affect. Extremities: No clubbing or cyanosis.  HEENT: Normal.   Assessment/Plan 1. Chronic systolic=>diastolic CHF: EF 16-10% on 7/17 echo, 30-35% on 1/18 echo.  Nonischemic cardiomyopathy, possible role for tachycardia-mediated cardiomyopathy (some improvement in EF with rate control but not back to normal). ECHO 03/2017 EF 40-45%.  She had AV nodal ablation due to difficult-to-control atrial fibrillation in 3/18 with St Jude CRT-P placement. Repeat echo in 9/19 showed EF up to 60-65%.  Echo in 10/20 with EF 55-60%, moderately decreased RV systolic function with moderate RV enlargement, moderate-severe biatrial enlargement, moderate-severe TR, mild MR.  Echo in 4/22 with EF 55% with mild LVH, mild RV enlargement with mildly decreased RV systolic function, severe biatrial enlargement, mild MR, moderate TR.  Echo (5/23) with EF 50-55%, moderate RV enlargement with normal systolic function, PASP 42 mmHg, moderate MR, severe TR.  Echo in 12/23 with EF 45-50%, lateral hypokinesis, severe RV dilation with mildly decreased RV systolic function, severe TR.  At this point, she appears to have predominantly RV failure with severe TR.  This is complicated by CKD stage 3b. She has lost  17 lbs and actually appears on the dry side today with orthostatic symptoms.  Corvue suggests euvolemia.   - Hold torsemide x 2 days then decrease to 20 mg daily.  BMET/BNP tdoay.  - Unable to tolerate losartan at even low dose.  - Hold off on bisoprolol with orthostatic symptoms.     - Continue spironolactone 25 daily, take qhs.  - Continue dapagliflozin 10 mg daily.  - Wear compression stockings during the day.  2. Atrial fibrillation/flutter:  Concern for prior tachy-mediated cardiomyopathy.  She had suspected amiodarone-induced lung toxicity so ranolazine was started.  She did not tolerate ranolazine due to nausea. Poor Tikosyn candidate with CKD.   She tolerated atrial fibrillation poorly and rate was difficult to control, so she had AV nodal ablation with St Jude CRT-P device.  She is in chronic AF.  - Continue apixaban 2.5 mg bid (age, creatinine). No bleeding issues. 3. CAD: Patent OM stent on coronary angiography in 7/17.  No chest pain. .  - Continue statin. - No ASA with apixaban use.   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  Off amiodarone.  Follows with pulmonary.  5. CKD stage 3: Recent BMET with creatinine 1.66.    Follow up in 1 month with APP.  I am concerned that her combination of RV failure and renal failure is going to be difficult to manage.   Signed, Marca Ancona, MD  01/09/2023  [facility] [address] [facility] [place] [address] [phone] (office) [phone] (fax)

## 2023-01-15 ENCOUNTER — Encounter: Payer: Self-pay | Admitting: Podiatry

## 2023-01-15 ENCOUNTER — Ambulatory Visit: Payer: PPO | Admitting: Podiatry

## 2023-01-15 DIAGNOSIS — L97521 Non-pressure chronic ulcer of other part of left foot limited to breakdown of skin: Secondary | ICD-10-CM | POA: Diagnosis not present

## 2023-01-15 DIAGNOSIS — E11621 Type 2 diabetes mellitus with foot ulcer: Secondary | ICD-10-CM

## 2023-01-15 NOTE — Progress Notes (Signed)
Chief Complaint  Patient presents with   Nail Problem    Pt is here for [facility] and her great toe on her left foot has discoloration.. pt denies pain from the toe    HPI: 89 y.o. female presenting today for follow-up of left hallux hematoma.  They have been applying Betadine to the area and dressing with gauze to the best of their ability.  Patient states that she is happy that she no longer has pain to the area.  She is in a wheelchair today as she was on last visit.  Her son is in the room as well  Past Medical History:  Diagnosis Date   Arthritis    Baker's cyst of knee    CAD (coronary artery disease)    a. 05/2006 Cath/PCI: LAD 77m, D1 40 ost, LCX nl, OM1 95 (3.0x20 Taxus DES), RCA nl, EF 40% w/ lat AK;  04/2007 Ex MV: minimal lat ischemia in area of prior infarct->low risk-> med Rx.   Chronic atrial fibrillation (HCC)    a. on Coumadin   Chronic systolic (congestive) heart failure (HCC)    a. 04/2015: EF 50-55% b. echo 12/2015: EF 20-25% w/ diffuse HK, biatrial enlargement, mild AI and MR, severe TR     Diabetes mellitus    Hyperlipidemia    MVP (mitral valve prolapse)    a. 10/2003 Echo: nl LV fxn, mild MR/TR/AS   Myocardial infarction [facility])    Sleep apnea    Transient ischemic attack     Past Surgical History:  Procedure Laterality Date   ATRIAL FLUTTER ABLATION N/A 10/09/2012   Procedure: ATRIAL FLUTTER ABLATION;  Surgeon: Hillis Range, MD;  Location: [facility] CATH LAB;  Service: Cardiovascular;  Laterality: N/A;   AV NODE ABLATION N/A 09/27/2016   Procedure: AV Node Ablation;  Surgeon: Hillis Range, MD;  Location: [facility] INVASIVE CV LAB;  Service: Cardiovascular;  Laterality: N/A;   BIV PACEMAKER INSERTION CRT-P N/A 09/27/2016   Procedure: BiV Pacemaker Insertion CRT-P;  Surgeon: Hillis Range, MD;  Location: [facility] INVASIVE CV LAB;  Service: Cardiovascular;  Laterality: N/A;   CARDIAC CATHETERIZATION N/A 01/06/2016   Procedure: Right/Left Heart Cath and Coronary Angiography;  Surgeon:  Lyn Records, MD;  Location: [facility] INVASIVE CV LAB;  Service: Cardiovascular;  Laterality: N/A;   CARDIOVERSION N/A 01/11/2016   Procedure: CARDIOVERSION;  Surgeon: Laurey Morale, MD;  Location: [facility] ENDOSCOPY;  Service: Cardiovascular;  Laterality: N/A;   cataracts     COLONOSCOPY N/A 10/13/2015   Procedure: COLONOSCOPY;  Surgeon: Ruffin Frederick, MD;  Location: [facility] ENDOSCOPY;  Service: Gastroenterology;  Laterality: N/A;   CORONARY ANGIOPLASTY     Status post PTCA and stenting of the first obtuse marginal-05/13/2006. We placed a 3.0 x 20 mm Taxus stent. It was post dilated using a 3.25 mm noncompliant balloon up to 14 atmospheres   IMPLANT POCKET INCISION & DRAINAGE N/A 11/02/2016   Procedure: Implant Pocket Incision & Drainage;  Surgeon: Marinus Maw, MD;  Location: [facility] INVASIVE CV LAB;  Service: Cardiovascular;  Laterality: N/A;   PPM GENERATOR CHANGEOUT N/A 11/28/2021   Procedure: PPM GENERATOR CHANGEOUT;  Surgeon: Marinus Maw, MD;  Location: [facility] INVASIVE CV LAB;  Service: Cardiovascular;  Laterality: N/A;   TONSILLECTOMY      Allergies  Allergen Reactions   Amiodarone Anaphylaxis   Ranexa [Ranolazine] Nausea And Vomiting   Other Other (See Comments)    Vitamin E - causes heart rate to increase   Demerol Other (  See Comments)    Burning sensation all over body   Erythromycin Nausea And Vomiting   Pecan Nut (Diagnostic) Diarrhea   Penicillins Swelling   Percocet [Oxycodone-Acetaminophen] Swelling   Tomato Rash    In large amounts      PHYSICAL EXAM: There were no vitals filed for this visit.  General: The patient is alert and oriented x3 in no acute distress.  Dermatology: Skin is warm, dry and supple bilateral lower extremities. Interspaces are clear of maceration and debris.      Wound 1 description:  Location: Distal left hallux.    Depth: Partial-thickness    Wound Border: Hyperkeratotic with dried bloody eschar    Odor?:  No    Surrounding Tissue: No edema no  erythema    Infected?:  No    Necrosis?:  None    Pain?:  None    Tunneling: No    Dimensions (cm): 0.3 x 0.4 x 0.1 cm  Vascular: Pedal pulses are diminished  Neurological: Light touch sensation grossly intact bilateral feet.   Musculoskeletal Exam: Patient has multiple contracted hammertoes which does leave the hallux longer than the rest and more exposed to shoe gear or injury as if she were to stubbed the toe.  ASSESSMENT / PLAN OF CARE: 1. Diabetic ulcer of toe of left foot associated with type 2 diabetes mellitus, limited to breakdown of skin (HCC)     The ulceration was sharply debrided of hyperkeratotic and devitalized soft tissue with sterile #312 blade to the level of dermis.  Hemostasis obtained.  Antibiotic ointment and DSD applied.  Reviewed off-loading with patient.  Informed patient that the wound looks 75 to 80% better than it did last visit.  Continue with current daily dressing changes with the Betadine and gauze dressing to the best of their ability.  Follow-up in 2 weeks for another recheck.  Discussed risks / concerns regarding ulcer with patient and possible sequelae if left untreated.  Stressed importance of infection prevention at home. Short-term goals are:  resolve infection, off-load ulcer, heal ulcer Long-term goals are:  prevent recurrence, prevent amputation.   Return in about 2 weeks (around 01/29/2023) for recheck ulcer at tip of toe.   Clerance Lav, DPM, FACFAS [facility]     [address] [place], [place] [address]                Office [phone]  Fax [phone]

## 2023-01-28 ENCOUNTER — Ambulatory Visit: Payer: PPO | Admitting: Podiatry

## 2023-01-28 ENCOUNTER — Ambulatory Visit: Payer: PPO

## 2023-01-28 DIAGNOSIS — E11621 Type 2 diabetes mellitus with foot ulcer: Secondary | ICD-10-CM | POA: Diagnosis not present

## 2023-01-28 DIAGNOSIS — Z950 Presence of cardiac pacemaker: Secondary | ICD-10-CM

## 2023-01-28 DIAGNOSIS — I5022 Chronic systolic (congestive) heart failure: Secondary | ICD-10-CM

## 2023-01-28 DIAGNOSIS — L97521 Non-pressure chronic ulcer of other part of left foot limited to breakdown of skin: Secondary | ICD-10-CM | POA: Diagnosis not present

## 2023-01-28 NOTE — Progress Notes (Unsigned)
HPI: 89 y.o. female presenting today for follow-up of distal left hallux ulceration.  Patient has been treating the area with antibiotic ointment to gauze dressing daily.  She has minimal to no pain at this area now.  She is pleased with how she has been improving.  Her family member is with her today  Past Medical History:  Diagnosis Date   Arthritis    Baker's cyst of knee    CAD (coronary artery disease)    a. 05/2006 Cath/PCI: LAD 23m, D1 40 ost, LCX nl, OM1 95 (3.0x20 Taxus DES), RCA nl, EF 40% w/ lat AK;  04/2007 Ex MV: minimal lat ischemia in area of prior infarct->low risk-> med Rx.   Chronic atrial fibrillation (HCC)    a. on Coumadin   Chronic systolic (congestive) heart failure (HCC)    a. 04/2015: EF 50-55% b. echo 12/2015: EF 20-25% w/ diffuse HK, biatrial enlargement, mild AI and MR, severe TR     Diabetes mellitus    Hyperlipidemia    MVP (mitral valve prolapse)    a. 10/2003 Echo: nl LV fxn, mild MR/TR/AS   Myocardial infarction [facility])    Sleep apnea    Transient ischemic attack     Past Surgical History:  Procedure Laterality Date   ATRIAL FLUTTER ABLATION N/A 10/09/2012   Procedure: ATRIAL FLUTTER ABLATION;  Surgeon: Hillis Range, MD;  Location: [facility] CATH LAB;  Service: Cardiovascular;  Laterality: N/A;   AV NODE ABLATION N/A 09/27/2016   Procedure: AV Node Ablation;  Surgeon: Hillis Range, MD;  Location: [facility] INVASIVE CV LAB;  Service: Cardiovascular;  Laterality: N/A;   BIV PACEMAKER INSERTION CRT-P N/A 09/27/2016   Procedure: BiV Pacemaker Insertion CRT-P;  Surgeon: Hillis Range, MD;  Location: [facility] INVASIVE CV LAB;  Service: Cardiovascular;  Laterality: N/A;   CARDIAC CATHETERIZATION N/A 01/06/2016   Procedure: Right/Left Heart Cath and Coronary Angiography;  Surgeon: Lyn Records, MD;  Location: [facility] INVASIVE CV LAB;  Service: Cardiovascular;  Laterality: N/A;   CARDIOVERSION N/A 01/11/2016   Procedure: CARDIOVERSION;  Surgeon: Laurey Morale, MD;  Location: [facility]  ENDOSCOPY;  Service: Cardiovascular;  Laterality: N/A;   cataracts     COLONOSCOPY N/A 10/13/2015   Procedure: COLONOSCOPY;  Surgeon: Ruffin Frederick, MD;  Location: [facility] ENDOSCOPY;  Service: Gastroenterology;  Laterality: N/A;   CORONARY ANGIOPLASTY     Status post PTCA and stenting of the first obtuse marginal-05/13/2006. We placed a 3.0 x 20 mm Taxus stent. It was post dilated using a 3.25 mm noncompliant balloon up to 14 atmospheres   IMPLANT POCKET INCISION & DRAINAGE N/A 11/02/2016   Procedure: Implant Pocket Incision & Drainage;  Surgeon: Marinus Maw, MD;  Location: [facility] INVASIVE CV LAB;  Service: Cardiovascular;  Laterality: N/A;   PPM GENERATOR CHANGEOUT N/A 11/28/2021   Procedure: PPM GENERATOR CHANGEOUT;  Surgeon: Marinus Maw, MD;  Location: [facility] INVASIVE CV LAB;  Service: Cardiovascular;  Laterality: N/A;   TONSILLECTOMY      Allergies  Allergen Reactions   Amiodarone Anaphylaxis   Ranexa [Ranolazine] Nausea And Vomiting   Other Other (See Comments)    Vitamin E - causes heart rate to increase   Demerol Other (See Comments)    Burning sensation all over body   Erythromycin Nausea And Vomiting   Pecan Nut (Diagnostic) Diarrhea   Penicillins Swelling   Percocet [Oxycodone-Acetaminophen] Swelling   Tomato Rash    In large amounts     PHYSICAL EXAM:  There were no vitals filed for this visit.  General: The patient is alert and oriented x3 in no acute distress.  Dermatology: Skin is warm, dry and supple bilateral lower extremities. Interspaces are clear of maceration and debris.      Wound 1 description:  Location: Distal left hallux    Depth: Superficial    Wound Border: Minimal hyperkeratosis and peeling skin    Odor?:  No    Surrounding Tissue: Clear, no erythema or edema    Infected?:  No    Necrosis?:  No    Pain?:  None    Tunneling: No tunneling noted    Dimensions (cm): 0.4 cm x 0.4 cm x 0.1 cm  Vascular: Pedal pulses are diminished  Neurological:  Light touch sensation grossly intact bilateral feet.    ASSESSMENT / PLAN OF CARE: 1. Diabetic ulcer of toe of left foot associated with type 2 diabetes mellitus, limited to breakdown of skin (HCC)    The ulceration was sharply debrided of hyperkeratotic and devitalized soft tissue with sterile #312 blade to the level of dermis .  Hemostasis obtained.  Antibiotic ointment and DSD applied.  Reviewed off-loading with patient.  Reviewed daily dressing changes with patient. Patient will d/c dressings in one week.  She can start letting the toe "air out" overnight starting tomorrow.  Discussed risks / concerns regarding ulcer with patient and possible sequelae if left untreated.  Stressed importance of infection prevention at home. Short-term goals are:  resolve infection, off-load ulcer, heal ulcer Long-term goals are:  prevent recurrence, prevent amputation.   Return for as scheduled for RFC.   Clerance Lav, DPM, FACFAS [facility]     [address] [place], [place] [address]                Office [phone]  Fax [phone]

## 2023-01-30 ENCOUNTER — Telehealth: Payer: Self-pay

## 2023-01-30 NOTE — Progress Notes (Signed)
EPIC Encounter for ICM Monitoring  Patient Name: Shannon [name] is a 89 y.o. female Date: 01/30/2023 Primary Care Physican: Gaspar Garbe, MD Primary Cardiologist: Nahser/McLean Electrophysiologist: Rae Roam Pacing: >99%          10/08/2022 Weight: 107 lbs  10/15/2022 Weight: 106.4 lbs 11/19/2022 Weight: 101.5 lbs                                                            Attempted call to patient and unable to reach.   Transmission reviewed.    CorVue thoracic impedance suggesting normal fluid levels with the exception of possible fluid accumulation from 7/14-7/26.    Prescribed:  Torsemide 20 mg take 2 tablet(s) (40 mg total) by mouth every other day alternating with 1 tablet (20 mg total) every other day. Spironolactone 25 mg take 1 tablet daily x 90 days starting 08/21/2022   Labs: 10/12/2022 Creatinine 1.66, BUN 50, Potassium 3.9, Sodium 132, GFR 29  09/28/2022 Creatinine 1.71, BUN 31, Potassium 4.3, Sodium 135, GFR 28  09/07/2022 Creatinine 1.54, BUN 27, Potassium 4.6, Sodium 136, GFR 32  08/28/2022 Creatinine 1.45, BUN 25, Potassium 3.7, Sodium 135, GFR 35 A complete set of results can be found in Results Review.   Recommendations:  No changes.    Follow-up plan: ICM clinic phone appointment on 03/05/2023.   91 day device clinic remote transmission 03/01/2023.     EP/Cardiology Office Visits:  02/15/2023 with HF clinic.  Recall 09/07/2022 with Dr Elease Hashimoto (yearly).  Recall 03/09/2023 with Otilio Saber, PA.     Copy of ICM check sent to Dr. Ladona Ridgel.   3 month ICM trend: 01/28/2023.    12-14 Month ICM trend:     Karie Soda, RN 01/30/2023 2:27 PM

## 2023-01-30 NOTE — Telephone Encounter (Signed)
Remote ICM transmission received.  Attempted call to patient regarding ICM remote transmission and no answer.  

## 2023-02-02 DIAGNOSIS — G238 Other specified degenerative diseases of basal ganglia: Secondary | ICD-10-CM | POA: Diagnosis not present

## 2023-02-02 DIAGNOSIS — S51812A Laceration without foreign body of left forearm, initial encounter: Secondary | ICD-10-CM | POA: Diagnosis not present

## 2023-02-02 DIAGNOSIS — Z9181 History of falling: Secondary | ICD-10-CM | POA: Diagnosis not present

## 2023-02-02 DIAGNOSIS — Z753 Unavailability and inaccessibility of health-care facilities: Secondary | ICD-10-CM | POA: Diagnosis not present

## 2023-02-02 DIAGNOSIS — Z043 Encounter for examination and observation following other accident: Secondary | ICD-10-CM | POA: Diagnosis not present

## 2023-02-02 DIAGNOSIS — Z23 Encounter for immunization: Secondary | ICD-10-CM | POA: Diagnosis not present

## 2023-02-04 DIAGNOSIS — E1140 Type 2 diabetes mellitus with diabetic neuropathy, unspecified: Secondary | ICD-10-CM | POA: Diagnosis not present

## 2023-02-04 DIAGNOSIS — D638 Anemia in other chronic diseases classified elsewhere: Secondary | ICD-10-CM | POA: Diagnosis not present

## 2023-02-04 DIAGNOSIS — N184 Chronic kidney disease, stage 4 (severe): Secondary | ICD-10-CM | POA: Diagnosis not present

## 2023-02-04 DIAGNOSIS — E559 Vitamin D deficiency, unspecified: Secondary | ICD-10-CM | POA: Diagnosis not present

## 2023-02-04 DIAGNOSIS — I509 Heart failure, unspecified: Secondary | ICD-10-CM | POA: Diagnosis not present

## 2023-02-04 DIAGNOSIS — E7800 Pure hypercholesterolemia, unspecified: Secondary | ICD-10-CM | POA: Diagnosis not present

## 2023-02-04 DIAGNOSIS — E785 Hyperlipidemia, unspecified: Secondary | ICD-10-CM | POA: Diagnosis not present

## 2023-02-04 DIAGNOSIS — D649 Anemia, unspecified: Secondary | ICD-10-CM | POA: Diagnosis not present

## 2023-02-04 DIAGNOSIS — R7989 Other specified abnormal findings of blood chemistry: Secondary | ICD-10-CM | POA: Diagnosis not present

## 2023-02-04 DIAGNOSIS — I130 Hypertensive heart and chronic kidney disease with heart failure and stage 1 through stage 4 chronic kidney disease, or unspecified chronic kidney disease: Secondary | ICD-10-CM | POA: Diagnosis not present

## 2023-02-11 DIAGNOSIS — Z0000 Encounter for general adult medical examination without abnormal findings: Secondary | ICD-10-CM | POA: Diagnosis not present

## 2023-02-11 DIAGNOSIS — I130 Hypertensive heart and chronic kidney disease with heart failure and stage 1 through stage 4 chronic kidney disease, or unspecified chronic kidney disease: Secondary | ICD-10-CM | POA: Diagnosis not present

## 2023-02-11 DIAGNOSIS — T148XXA Other injury of unspecified body region, initial encounter: Secondary | ICD-10-CM | POA: Diagnosis not present

## 2023-02-11 DIAGNOSIS — Z7901 Long term (current) use of anticoagulants: Secondary | ICD-10-CM | POA: Diagnosis not present

## 2023-02-11 DIAGNOSIS — Z1332 Encounter for screening for maternal depression: Secondary | ICD-10-CM | POA: Diagnosis not present

## 2023-02-11 DIAGNOSIS — R7989 Other specified abnormal findings of blood chemistry: Secondary | ICD-10-CM | POA: Diagnosis not present

## 2023-02-11 DIAGNOSIS — E1140 Type 2 diabetes mellitus with diabetic neuropathy, unspecified: Secondary | ICD-10-CM | POA: Diagnosis not present

## 2023-02-11 DIAGNOSIS — R82998 Other abnormal findings in urine: Secondary | ICD-10-CM | POA: Diagnosis not present

## 2023-02-11 DIAGNOSIS — I87319 Chronic venous hypertension (idiopathic) with ulcer of unspecified lower extremity: Secondary | ICD-10-CM | POA: Diagnosis not present

## 2023-02-11 DIAGNOSIS — D638 Anemia in other chronic diseases classified elsewhere: Secondary | ICD-10-CM | POA: Diagnosis not present

## 2023-02-11 DIAGNOSIS — Z1331 Encounter for screening for depression: Secondary | ICD-10-CM | POA: Diagnosis not present

## 2023-02-11 DIAGNOSIS — I739 Peripheral vascular disease, unspecified: Secondary | ICD-10-CM | POA: Diagnosis not present

## 2023-02-11 DIAGNOSIS — I482 Chronic atrial fibrillation, unspecified: Secondary | ICD-10-CM | POA: Diagnosis not present

## 2023-02-11 DIAGNOSIS — I509 Heart failure, unspecified: Secondary | ICD-10-CM | POA: Diagnosis not present

## 2023-02-11 DIAGNOSIS — N184 Chronic kidney disease, stage 4 (severe): Secondary | ICD-10-CM | POA: Diagnosis not present

## 2023-02-13 NOTE — Progress Notes (Signed)
ID:  Shannon [name], DOB 1932/03/12, MRN [id number]  Provider location: [facility] Type of Visit: Established patient   PCP:  Tisovec, Adelfa Koh, MD  Cardiologist:  Kristeen Miss, MD HF Cardiology: Dr. Shirlee Latch   History of Present Illness: Shannon [name] is a 89 y.o. female who has a history of paroxysmal atrial fibrillation, chronic systolic CHF, and CAD. She apparently had been in persistent atrial fibrillation since around 8/16.  It appears that she was tachycardic most of the time prior to her recent admission in 7/17.  She had been on amiodarone prior to admission but it was not keeping her in NSR.  She was admitted with acute systolic CHF.  EF had dropped to 20-25% in 7/17 from 50-55% in 10/16.  She was in atrial fibrillation with RVR.  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  Amiodarone was stopped due to suspicion for amiodarone-induced lung toxicity (she was seen by pulmonary).  She was eventually cardioverted to NSR.  Ranolazine was started to try to keep her in NSR.  HR was low in the hospital so she has not been on a beta blocker.  She was extensively diuresed and discharged to SNF.  After discharge, she developed persistent nausea.  Eventually, it was found that the nausea was likely from ranolazine, so this medication was stopped and the nausea resolved.     Patient continued to have difficult-to-control atrial fibrillation.  GFR was too low for Tikosyn.  She therefore had AV nodal ablation with [employer] Jude CRT-P device placed in 3/18.  She developed a pocket hematoma and had pocket revision with sub-pectoral placement in 5/18.    Echo in 9/19 showed EF up to 60-65%, moderate MR, moderate TR, PASP 43 mmHg. Echo in 10/20 showed EF 55-60%, moderately decreased RV systolic function with moderate RV enlargement, moderate-severe biatrial enlargement, moderate-severe TR, mild MR.   Echo in 4/22 showed EF 55% with  mild LVH, mild RV enlargement with mildly decreased RV systolic function, severe biatrial enlargement, mild MR, moderate TR.  Echo in 5/23 showed EF 50-55%, moderate RV enlargement with normal systolic function, PASP 42 mmHg, moderate MR, severe TR.   Patient was admitted to the hospital in [facility] in 12/23 with CHF, predominantly RV failure.  Echo there showed EF 45-50%, lateral hypokinesis, severe RV dilation with mildly decreased RV systolic function, severe TR.   Follow up 7/24, she was down 17 lbs, was lightheaded and had a recent fall w/o injury. Felt to be hypovolemic, torsemide held x 2 days then reduced to 20 mg daily.  Today she returns for HF follow up with her son. Overall feeling fine. She had a mechanical fall 2 weeks ago, slipped on gravel. She has occasional dizzy spells. She walks with a walker around her home, she has SOB if she pushes herself rushing with ADLs. Feels occasional palpitations. Denies abnormal bleeding, CP, dizziness, edema, or PND/Orthopnea. Appetite ok. No fever or chills. Taking all medications.   ReDs: 30%  ECG (personally reviewed): none ordered today.  St Jude device interrogation: stable thoracic impedance, >99% BiV pacing.    Labs (6/19): K 4.4, creatinine 1.7, hgb 12.8, LDL 54 Labs (10/19): K 4.2, creatinine 1.46 Labs (6/21): K 4.6, creatinine 1.5, LDL 43, HDL 51 Labs (3/22): BNP 635, LFTs normal, K 3.8, creatinine 1.81 Labs (5/23): K 4.3, creatinine 1.63 => 1.67 Labs (9/23): K 4.0, creatinine 2.11 Labs (11/23): K 5.1, creatinine 1.74  Labs (1/24): BNP 704, K 3.3, creatinine 2.54 => 1.68 Labs (4/24): K 3.9, creatinine 1.66 Labs (8/24): K 4.5, creatinine 1.65    PMH: 1. Atrial fibrillation: Paroxysmal.  Had been persistent since 8/16, was cardioverted to NSR during 7/17 admission.  Possible atypical atrial flutter on 12/17 ECG.  By 3/18, atrial fibrillation again.  - Nausea with ranolazine used for atrial fibrillation suppression.  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  - AV nodal ablation 3/18 with CRT-P.  2. 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  She had been in atrial fibrillation with elevated rate possibly for months prior to 7/17 admission.  - Echo (10/16): EF 50-55%.  - Echo (7/17): EF 20-25%, mildly decreased RV systolic function, severe TR, PA systolic pressure 64 mmHg.  - LHC/RHC (7/17): Patent OM stent, no obstructive CAD.  Mean RA 13, PA 45/25, mean PCWP 16, CI 2.36.  - Echo (1/18): EF 30-35% with diffuse hypokinesis, mildly decreased RV systolic function, moderate TR and MR, PASP 53 mmHg, severe LAE.  - St Jude CRT-P placed with AV nodal ablation in 3/18.  - Echo (9/19): EF 60-65%, moderate LVH, grade 3 diastolic dysfunction, mild MR, mild AI, moderate TR, PASP 43 mmHg.  - Echo (10/20): EF 55-60%, moderately decreased RV systolic function with moderate RV enlargement, moderate-severe biatrial enlargement, moderate-severe TR, mild MR.  - Echo (4/22): EF 55% with mild LVH, mild RV enlargement with mildly decreased RV systolic function, severe biatrial enlargement, mild MR, moderate TR.  - Echo (5/23): EF 50-55%, moderate RV enlargement with normal systolic function, PASP 42 mmHg, moderate MR, severe TR.  - Echo (12/23): EF 45-50%, lateral hypokinesis, severe RV dilation with mildly decreased RV systolic function, severe TR.  3. CAD: DES to OM in 2007.  LHC (7/17) with patent stent and no new obstructive disease.  4. Type II diabetes 5. Hyperlipidemia 6. TIA 7. Possible amiodarone lung toxicity: Chest CT (7/17) with bronchiectasis/fibrosis, patchy bilateral densities.  8. CKD stage 3  Past Surgical History:  Procedure Laterality Date   ATRIAL FLUTTER ABLATION N/A 10/09/2012   Procedure: ATRIAL FLUTTER ABLATION;  Surgeon: Hillis Range, MD;  Location: [facility] CATH LAB;  Service: Cardiovascular;  Laterality: N/A;   AV NODE ABLATION N/A 09/27/2016   Procedure: AV Node  Ablation;  Surgeon: Hillis Range, MD;  Location: [facility] INVASIVE CV LAB;  Service: Cardiovascular;  Laterality: N/A;   BIV PACEMAKER INSERTION CRT-P N/A 09/27/2016   Procedure: BiV Pacemaker Insertion CRT-P;  Surgeon: Hillis Range, MD;  Location: [facility] INVASIVE CV LAB;  Service: Cardiovascular;  Laterality: N/A;   CARDIAC CATHETERIZATION N/A 01/06/2016   Procedure: Right/Left Heart Cath and Coronary Angiography;  Surgeon: Lyn Records, MD;  Location: [facility] INVASIVE CV LAB;  Service: Cardiovascular;  Laterality: N/A;   CARDIOVERSION N/A 01/11/2016   Procedure: CARDIOVERSION;  Surgeon: Laurey Morale, MD;  Location: [facility] ENDOSCOPY;  Service: Cardiovascular;  Laterality: N/A;   cataracts     COLONOSCOPY N/A 10/13/2015   Procedure: COLONOSCOPY;  Surgeon: Ruffin Frederick, MD;  Location: [facility] ENDOSCOPY;  Service: Gastroenterology;  Laterality: N/A;   CORONARY ANGIOPLASTY     Status post PTCA and stenting of the first obtuse marginal-05/13/2006. We placed a 3.0 x 20 mm Taxus stent. It was post dilated using a 3.25 mm noncompliant balloon up to 14 atmospheres   IMPLANT POCKET INCISION & DRAINAGE N/A 11/02/2016   Procedure: Implant Pocket Incision & Drainage;  Surgeon: Marinus Maw, MD;  Location: [facility] INVASIVE CV LAB;  Service: Cardiovascular;  Laterality: N/A;   PPM GENERATOR CHANGEOUT N/A 11/28/2021   Procedure: PPM GENERATOR CHANGEOUT;  Surgeon: Marinus Maw, MD;  Location: [facility] INVASIVE CV LAB;  Service: Cardiovascular;  Laterality: N/A;   TONSILLECTOMY     Current Outpatient Medications  Medication Sig Dispense Refill   ACCU-CHEK FASTCLIX LANCETS MISC USE TO CHECK BLOOD SUGAR TWICE DAILY  5   ACCU-CHEK SMARTVIEW test strip      atorvastatin (LIPITOR) 20 MG tablet TAKE 1 TABLET BY MOUTH EVERY DAY 30 tablet 11   dapagliflozin propanediol (FARXIGA) 10 MG TABS tablet Take 1 tablet (10 mg total) by mouth daily before breakfast. 30 tablet 11   ELIQUIS 2.5 MG TABS tablet TAKE 1 TABLET (2.5 MG TOTAL) BY MOUTH 2  (TWO) TIMES DAILY. RESUME 6/4 WITH THE EVENING DOSE 60 tablet 11   gabapentin (NEURONTIN) 100 MG capsule Take 100 mg by mouth daily as needed.     Homeopathic Products (ARNICARE ARTHRITIS) TBDP Take 1 tablet by mouth daily as needed (mouth pain).     loratadine (CLARITIN) 10 MG tablet Take 10 mg by mouth daily as needed for allergies.     metFORMIN (GLUCOPHAGE) 500 MG tablet Take 500 mg by mouth daily.     nitroGLYCERIN (NITROSTAT) 0.4 MG SL tablet Take 1 tablet as needed. 75 tablet 1   OVER THE COUNTER MEDICATION Apply 1 application. topically daily as needed (arthritis). Artrosilium topical cream     OVER THE COUNTER MEDICATION Apply 1 application. topically daily as needed (leg pain). magnilife db foot cream     spironolactone (ALDACTONE) 25 MG tablet 1 tablet Orally once a day for 90 days     torsemide (DEMADEX) 20 MG tablet Take 20 mg by mouth daily.     No current facility-administered medications for this encounter.   Allergies:   Amiodarone, Ranexa [ranolazine], Other, Demerol, Erythromycin, Pecan nut (diagnostic), Penicillins, Percocet [oxycodone-acetaminophen], and Tomato   Social History:  The patient  reports that she quit smoking about 59 years ago. Her smoking use included cigarettes. She has never used smokeless tobacco. She reports that she does not drink alcohol and does not use drugs.   Family History:  The patient's family history includes Alzheimer's disease in her sister; Coronary artery disease in her sister; Heart attack in her brother, father, mother, and sister; Stroke in her mother and sister.   ROS:  Please see the history of present illness.   All other systems are personally reviewed and negative.   Recent Labs: 09/28/2022: B Natriuretic Peptide 760.1 01/08/2023: BUN 38; Creatinine, Ser 1.68; Hemoglobin 11.9; Platelets 181; Potassium 4.1; Sodium 132  Personally reviewed   Wt Readings from Last 3 Encounters:  02/15/23 46.7 kg (103 lb)  01/08/23 48.6 kg (107 lb 3.2  oz)  09/18/22 56.3 kg (124 lb 3.2 oz)   BP 104/62   Pulse 63   Wt 46.7 kg (103 lb)   SpO2 99%   BMI 18.84 kg/m  Physical Exam General:  NAD. No resp difficulty, arrived in [facility] HEENT: Normal Neck: Supple. No JVD. Carotids 2+ bilat; no bruits. No lymphadenopathy or thryomegaly appreciated. Cor: PMI nondisplaced. Regular rate & rhythm. No rubs, gallops, 2/6 HSM LLSB Lungs: Clear Abdomen: Soft, nontender, nondistended. No hepatosplenomegaly. No bruits or masses. Good bowel sounds. Extremities: No cyanosis, clubbing, rash, edema Neuro: Alert & oriented x 3, cranial nerves grossly intact. Moves all 4 extremities w/o difficulty. Affect pleasant.  Assessment/Plan 1. Chronic systolic=>diastolic CHF: EF 16-10% on 7/17  echo, 30-35% on 1/18 echo.  Nonischemic cardiomyopathy, possible role for tachycardia-mediated cardiomyopathy (some improvement in EF with rate control but not back to normal). ECHO 03/2017 EF 40-45%.  She had AV nodal ablation due to difficult-to-control atrial fibrillation in 3/18 with St Jude CRT-P placement. Repeat echo in 9/19 showed EF up to 60-65%.  Echo in 10/20 with EF 55-60%, moderately decreased RV systolic function with moderate RV enlargement, moderate-severe biatrial enlargement, moderate-severe TR, mild MR.  Echo in 4/22 with EF 55% with mild LVH, mild RV enlargement with mildly decreased RV systolic function, severe biatrial enlargement, mild MR, moderate TR.  Echo (5/23) with EF 50-55%, moderate RV enlargement with normal systolic function, PASP 42 mmHg, moderate MR, severe TR.  Echo in 12/23 with EF 45-50%, lateral hypokinesis, severe RV dilation with mildly decreased RV systolic function, severe TR.  At this point, she appears to have predominantly RV failure with severe TR.  This is complicated by CKD stage 3b. She looks good today, stable NYHA III symptoms. She is not volume overloaded by exam or CorVue. ReDs 30% - With lightheadedness, decrease spironolactone to 12.5 mg  every evening. Recent labs reviewed and are stable, K 4.5, creatinine 1.65 - Continue torsemide 20 mg daily.   - Continue dapagliflozin 10 mg daily. No GU symptoms.  - Unable to tolerate losartan at even low dose.  - Hold off on bisoprolol with orthostatic symptoms.     - Wear compression stockings during the day.  2. Atrial fibrillation/flutter:  Concern for prior tachy-mediated cardiomyopathy.  She had suspected amiodarone-induced lung toxicity so ranolazine was started.  She did not tolerate ranolazine due to nausea. Poor Tikosyn candidate with CKD.   She tolerated atrial fibrillation poorly and rate was difficult to control, so she had AV nodal ablation with St Jude CRT-P device.  She is in chronic AF.  - Continue apixaban 2.5 mg bid (age, creatinine). No bleeding issues. Recent CBC stable, Hgb 11.7  3. CAD: Patent OM stent on coronary angiography in 7/17.  No chest pain.  - Continue statin. - No ASA with apixaban use.   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  Off amiodarone.  Follows with pulmonary.  5. CKD stage 3: Recent BMET with creatinine 1.6.   - Continue Farxiga 10 mg daily.  Follow up in 6 weeks with APP and 4 months with Dr. Shirlee Latch. I am concerned that her combination of RV failure and renal failure is going to be difficult to manage.   Signed, Jacklynn Ganong, FNP  02/15/2023  [facility] [address] [facility] [place] [address] [phone] (office) [phone] (fax)

## 2023-02-15 ENCOUNTER — Ambulatory Visit (HOSPITAL_COMMUNITY)
Admission: RE | Admit: 2023-02-15 | Discharge: 2023-02-15 | Disposition: A | Discharge status: Home / Self Care | Payer: PPO | Source: Ambulatory Visit | Attending: Family Medicine | Admitting: Family Medicine

## 2023-02-15 ENCOUNTER — Encounter (HOSPITAL_COMMUNITY): Payer: Self-pay

## 2023-02-15 VITALS — BP 104/62 | HR 63 | Wt 103.0 lb

## 2023-02-15 DIAGNOSIS — I5022 Chronic systolic (congestive) heart failure: Secondary | ICD-10-CM | POA: Diagnosis not present

## 2023-02-15 DIAGNOSIS — I4819 Other persistent atrial fibrillation: Secondary | ICD-10-CM | POA: Insufficient documentation

## 2023-02-15 DIAGNOSIS — R002 Palpitations: Secondary | ICD-10-CM | POA: Insufficient documentation

## 2023-02-15 DIAGNOSIS — R0602 Shortness of breath: Secondary | ICD-10-CM | POA: Diagnosis not present

## 2023-02-15 DIAGNOSIS — R42 Dizziness and giddiness: Secondary | ICD-10-CM | POA: Diagnosis not present

## 2023-02-15 DIAGNOSIS — E1122 Type 2 diabetes mellitus with diabetic chronic kidney disease: Secondary | ICD-10-CM | POA: Insufficient documentation

## 2023-02-15 DIAGNOSIS — I428 Other cardiomyopathies: Secondary | ICD-10-CM | POA: Diagnosis not present

## 2023-02-15 DIAGNOSIS — Z7901 Long term (current) use of anticoagulants: Secondary | ICD-10-CM | POA: Insufficient documentation

## 2023-02-15 DIAGNOSIS — N183 Chronic kidney disease, stage 3 unspecified: Secondary | ICD-10-CM | POA: Diagnosis not present

## 2023-02-15 DIAGNOSIS — I2510 Atherosclerotic heart disease of native coronary artery without angina pectoris: Secondary | ICD-10-CM | POA: Diagnosis not present

## 2023-02-15 DIAGNOSIS — I4892 Unspecified atrial flutter: Secondary | ICD-10-CM | POA: Diagnosis not present

## 2023-02-15 DIAGNOSIS — E1136 Type 2 diabetes mellitus with diabetic cataract: Secondary | ICD-10-CM | POA: Diagnosis not present

## 2023-02-15 DIAGNOSIS — Z7984 Long term (current) use of oral hypoglycemic drugs: Secondary | ICD-10-CM | POA: Diagnosis not present

## 2023-02-15 DIAGNOSIS — N1832 Chronic kidney disease, stage 3b: Secondary | ICD-10-CM | POA: Diagnosis not present

## 2023-02-15 DIAGNOSIS — Z79899 Other long term (current) drug therapy: Secondary | ICD-10-CM | POA: Insufficient documentation

## 2023-02-15 MED ORDER — SPIRONOLACTONE 25 MG PO TABS: 12.5000 mg | ORAL_TABLET | Freq: Every evening | ORAL | 6 refills | Status: DC

## 2023-02-15 NOTE — Patient Instructions (Signed)
Thank you for coming in today  If you had labs drawn today, any labs that are abnormal the clinic will call you No news is good news  Medications: Decrease Spironolactone to 12.5 mg nightly  Follow up appointments:  Your physician recommends that you schedule a follow-up appointment in:  6 weeks in clinic 4 months With Dr. Shirlee Latch    Do the following things EVERYDAY: Weigh yourself in the morning before breakfast. Write it down and keep it in a log. Take your medicines as prescribed Eat low salt foods--Limit salt (sodium) to 2000 mg per day.  Stay as active as you can everyday Limit all fluids for the day to less than 2 liters   At the [facility], you and your health needs are our priority. As part of our continuing mission to provide you with exceptional heart care, we have created designated Provider Care Teams. These Care Teams include your primary Cardiologist (physician) and Advanced Practice Providers (APPs- Physician Assistants and Nurse Practitioners) who all work together to provide you with the care you need, when you need it.   You may see any of the following providers on your designated Care Team at your next follow up: Dr Arvilla Meres Dr Marca Ancona Dr. Marcos Eke, NP Robbie Lis, [place] [facility] [place], [place] Brynda Peon, NP Karle Plumber, PharmD   Please be sure to bring in all your medications bottles to every appointment.    Thank you for choosing [facility]  If you have any questions or concerns before your next appointment please send [place] a message through [place] or call our office at [phone].    TO LEAVE A MESSAGE FOR THE NURSE SELECT OPTION 2, PLEASE LEAVE A MESSAGE INCLUDING: YOUR NAME DATE OF BIRTH CALL BACK NUMBER REASON FOR CALL**this is important as we prioritize the call backs  YOU WILL RECEIVE A CALL BACK THE SAME DAY AS LONG AS YOU CALL  BEFORE 4:00 PM

## 2023-02-15 NOTE — Progress Notes (Signed)
ReDS Vest / Clip - 02/15/23 0900       ReDS Vest / Clip   Station Marker A    Ruler Value 21    ReDS Value Range Low volume    ReDS Actual Value 30

## 2023-02-25 DIAGNOSIS — Z515 Encounter for palliative care: Secondary | ICD-10-CM | POA: Diagnosis not present

## 2023-02-25 DIAGNOSIS — D6869 Other thrombophilia: Secondary | ICD-10-CM | POA: Diagnosis not present

## 2023-02-25 DIAGNOSIS — N1832 Chronic kidney disease, stage 3b: Secondary | ICD-10-CM | POA: Diagnosis not present

## 2023-02-25 DIAGNOSIS — Z7901 Long term (current) use of anticoagulants: Secondary | ICD-10-CM | POA: Diagnosis not present

## 2023-02-25 DIAGNOSIS — I480 Paroxysmal atrial fibrillation: Secondary | ICD-10-CM | POA: Diagnosis not present

## 2023-02-25 DIAGNOSIS — E1122 Type 2 diabetes mellitus with diabetic chronic kidney disease: Secondary | ICD-10-CM | POA: Diagnosis not present

## 2023-02-25 DIAGNOSIS — Z66 Do not resuscitate: Secondary | ICD-10-CM | POA: Diagnosis not present

## 2023-02-25 DIAGNOSIS — Z681 Body mass index (BMI) 19 or less, adult: Secondary | ICD-10-CM | POA: Diagnosis not present

## 2023-03-01 ENCOUNTER — Ambulatory Visit (INDEPENDENT_AMBULATORY_CARE_PROVIDER_SITE_OTHER): Payer: PPO

## 2023-03-01 DIAGNOSIS — I428 Other cardiomyopathies: Secondary | ICD-10-CM

## 2023-03-01 DIAGNOSIS — I5022 Chronic systolic (congestive) heart failure: Secondary | ICD-10-CM | POA: Diagnosis not present

## 2023-03-02 LAB — CUP PACEART REMOTE DEVICE CHECK
Battery Remaining Longevity: 35 mo
Battery Remaining Percentage: 76 %
Battery Voltage: 2.98 V
Date Time Interrogation Session: 20240830020028
Implantable Lead Connection Status: 753985
Implantable Lead Connection Status: 753985
Implantable Lead Connection Status: 753985
Implantable Lead Implant Date: 20180329
Implantable Lead Implant Date: 20180329
Implantable Lead Implant Date: 20180329
Implantable Lead Location: 753859
Implantable Lead Location: 753860
Implantable Lead Location: 753860
Implantable Lead Model: 3830
Implantable Lead Model: 5076
Implantable Pulse Generator Implant Date: 20230530
Lead Channel Impedance Value: 260 Ohm
Lead Channel Impedance Value: 350 Ohm
Lead Channel Pacing Threshold Amplitude: 1 V
Lead Channel Pacing Threshold Amplitude: 2.5 V
Lead Channel Pacing Threshold Pulse Width: 0.5 ms
Lead Channel Pacing Threshold Pulse Width: 1 ms
Lead Channel Sensing Intrinsic Amplitude: 7.8 mV
Lead Channel Setting Pacing Amplitude: 2 V
Lead Channel Setting Pacing Amplitude: 3.5 V
Lead Channel Setting Pacing Pulse Width: 0.5 ms
Lead Channel Setting Pacing Pulse Width: 1 ms
Lead Channel Setting Sensing Sensitivity: 4 mV
Pulse Gen Model: 3222
Pulse Gen Serial Number: 8080981

## 2023-03-05 ENCOUNTER — Telehealth: Payer: Self-pay

## 2023-03-05 ENCOUNTER — Ambulatory Visit: Payer: PPO | Attending: Internal Medicine

## 2023-03-05 DIAGNOSIS — I5022 Chronic systolic (congestive) heart failure: Secondary | ICD-10-CM

## 2023-03-05 DIAGNOSIS — Z950 Presence of cardiac pacemaker: Secondary | ICD-10-CM

## 2023-03-05 NOTE — Progress Notes (Signed)
Remote pacemaker transmission.   

## 2023-03-05 NOTE — Telephone Encounter (Signed)
Device Clinic apt 03/06/23 @ 9:20 am.

## 2023-03-05 NOTE — Telephone Encounter (Signed)
Following alert received from [employer] received for R wave measured 5.4-9.3 mv with RV sensitivity programmed at 4.0 mv.  HF diagnostics currently abnormal, followed by HF. Routing to triage to consider programming increase in or autosense for v. sensitivity for improved sensing.  Called patient to make Device clinic apt. Patient is dependent in RV ~ VP 30 bpm. Pt also has recall in, message sent to EP scheduler. No answer, LMTCB.

## 2023-03-06 ENCOUNTER — Ambulatory Visit: Payer: PPO | Attending: Internal Medicine

## 2023-03-06 DIAGNOSIS — I420 Dilated cardiomyopathy: Secondary | ICD-10-CM

## 2023-03-08 NOTE — Progress Notes (Signed)
Patient seen in device clinic to check RV sensing d/t recent alert. No alerts when loaded on programmer at visit. RV sensing measured and does not need reprogramming d/t safety margins mets. Spoke to [place] with [employer] who advised possibly the day before and possibly PVC's noted during testing. Testing occurrs every 8 hours and cleared during next testing. No changes to session.  PVC's noted on presenting.

## 2023-03-08 NOTE — Progress Notes (Signed)
EPIC Encounter for ICM Monitoring  Patient Name: Shannon [name] is a 89 y.o. female Date: 03/08/2023 Primary Care Physican: Gaspar Garbe, MD Primary Cardiologist: Nahser/McLean Electrophysiologist: Rae Roam Pacing: >99%          10/08/2022 Weight: 107 lbs  10/15/2022 Weight: 106.4 lbs 11/19/2022 Weight: 101.5 lbs 03/08/2023 Weight: 103 lbs                                                            Spoke with patient and heart failure questions reviewed.  Transmission results reviewed.  Pt asymptomatic for fluid accumulation.  Reports feeling well at this time and voices no complaints.   02/02/2023 ED visit for fall   CorVue thoracic impedance suggesting normal fluid levels with the exception of possible fluid accumulation from 8/19-9/1.    Prescribed:  Torsemide 20 mg take 1 tablet(s) (20 mg total) by mouth daily. Spironolactone 25 mg take 0.5 tablet (12.5 mg total) by mouth daily   Labs: 02/02/2023 Creatinine 1.65, BUN 36, Potassium 4.5, Sodium 133, GFR 30 01/08/2023 Creatinine 1.68, BUN 38, Potassium 4.1, Sodium 132, GFR 29 10/12/2022 Creatinine 1.66, BUN 50, Potassium 3.9, Sodium 132, GFR 29  09/28/2022 Creatinine 1.71, BUN 31, Potassium 4.3, Sodium 135, GFR 28  09/07/2022 Creatinine 1.54, BUN 27, Potassium 4.6, Sodium 136, GFR 32  08/28/2022 Creatinine 1.45, BUN 25, Potassium 3.7, Sodium 135, GFR 35 A complete set of results can be found in Results Review.   Recommendations:  No changes and encouraged to call if experiencing any fluid symptoms.   Follow-up plan: ICM clinic phone appointment on 04/08/2023.   91 day device clinic remote transmission 05/31/2023.     EP/Cardiology Office Visits:  03/29/2023 with HF clinic.  Recall 09/07/2022 with Dr Elease Hashimoto (yearly).  04/12/2023 with Francis Dowse, PA.  06/10/2023 with Dr Shirlee Latch.   Copy of ICM check sent to Dr. Ladona Ridgel.   3 month ICM trend: 03/05/2023.    12-14 Month ICM trend:     Karie Soda, RN 03/08/2023 8:15 AM

## 2023-03-11 LAB — CUP PACEART INCLINIC DEVICE CHECK
Battery Remaining Longevity: 33 mo
Battery Voltage: 2.98 V
Brady Statistic RA Percent Paced: 0 %
Brady Statistic RV Percent Paced: 99.1 %
Date Time Interrogation Session: 20240904160300
Implantable Lead Connection Status: 753985
Implantable Lead Connection Status: 753985
Implantable Lead Connection Status: 753985
Implantable Lead Implant Date: 20180329
Implantable Lead Implant Date: 20180329
Implantable Lead Implant Date: 20180329
Implantable Lead Location: 753859
Implantable Lead Location: 753860
Implantable Lead Location: 753860
Implantable Lead Model: 3830
Implantable Lead Model: 5076
Implantable Pulse Generator Implant Date: 20230530
Lead Channel Impedance Value: 287.5 Ohm
Lead Channel Impedance Value: 387.5 Ohm
Lead Channel Pacing Threshold Amplitude: 0.75 V
Lead Channel Pacing Threshold Amplitude: 0.75 V
Lead Channel Pacing Threshold Amplitude: 1.75 V
Lead Channel Pacing Threshold Amplitude: 1.75 V
Lead Channel Pacing Threshold Pulse Width: 0.5 ms
Lead Channel Pacing Threshold Pulse Width: 0.5 ms
Lead Channel Pacing Threshold Pulse Width: 1 ms
Lead Channel Pacing Threshold Pulse Width: 1 ms
Lead Channel Sensing Intrinsic Amplitude: 8.7 mV
Lead Channel Setting Pacing Amplitude: 2 V
Lead Channel Setting Pacing Amplitude: 3.5 V
Lead Channel Setting Pacing Pulse Width: 0.5 ms
Lead Channel Setting Pacing Pulse Width: 1 ms
Lead Channel Setting Sensing Sensitivity: 4 mV
Pulse Gen Model: 3222
Pulse Gen Serial Number: 8080981

## 2023-03-25 ENCOUNTER — Ambulatory Visit (INDEPENDENT_AMBULATORY_CARE_PROVIDER_SITE_OTHER): Payer: PPO | Admitting: Podiatry

## 2023-03-25 DIAGNOSIS — E11621 Type 2 diabetes mellitus with foot ulcer: Secondary | ICD-10-CM | POA: Diagnosis not present

## 2023-03-25 DIAGNOSIS — L97521 Non-pressure chronic ulcer of other part of left foot limited to breakdown of skin: Secondary | ICD-10-CM | POA: Diagnosis not present

## 2023-03-25 NOTE — Progress Notes (Unsigned)
Chief Complaint  Patient presents with   Foot Ulcer    Pt stated she is here to follow up on her left hallux toe    HPI: 89 y.o. female presenting today for f/u of ulcer on tip of left great toe.  Past Medical History:  Diagnosis Date   Arthritis    Baker's cyst of knee    CAD (coronary artery disease)    a. 05/2006 Cath/PCI: LAD 60m, D1 40 ost, LCX nl, OM1 95 (3.0x20 Taxus DES), RCA nl, EF 40% w/ lat AK;  04/2007 Ex MV: minimal lat ischemia in area of prior infarct->low risk-> med Rx.   Chronic atrial fibrillation (HCC)    a. on Coumadin   Chronic systolic (congestive) heart failure (HCC)    a. 04/2015: EF 50-55% b. echo 12/2015: EF 20-25% w/ diffuse HK, biatrial enlargement, mild AI and MR, severe TR     Diabetes mellitus    Hyperlipidemia    MVP (mitral valve prolapse)    a. 10/2003 Echo: nl LV fxn, mild MR/TR/AS   Myocardial infarction [facility])    Sleep apnea    Transient ischemic attack     Past Surgical History:  Procedure Laterality Date   ATRIAL FLUTTER ABLATION N/A 10/09/2012   Procedure: ATRIAL FLUTTER ABLATION;  Surgeon: Hillis Range, MD;  Location: [facility] CATH LAB;  Service: Cardiovascular;  Laterality: N/A;   AV NODE ABLATION N/A 09/27/2016   Procedure: AV Node Ablation;  Surgeon: Hillis Range, MD;  Location: [facility] INVASIVE CV LAB;  Service: Cardiovascular;  Laterality: N/A;   BIV PACEMAKER INSERTION CRT-P N/A 09/27/2016   Procedure: BiV Pacemaker Insertion CRT-P;  Surgeon: Hillis Range, MD;  Location: [facility] INVASIVE CV LAB;  Service: Cardiovascular;  Laterality: N/A;   CARDIAC CATHETERIZATION N/A 01/06/2016   Procedure: Right/Left Heart Cath and Coronary Angiography;  Surgeon: Lyn Records, MD;  Location: [facility] INVASIVE CV LAB;  Service: Cardiovascular;  Laterality: N/A;   CARDIOVERSION N/A 01/11/2016   Procedure: CARDIOVERSION;  Surgeon: Laurey Morale, MD;  Location: [facility] ENDOSCOPY;  Service: Cardiovascular;  Laterality: N/A;   cataracts     COLONOSCOPY N/A 10/13/2015    Procedure: COLONOSCOPY;  Surgeon: Ruffin Frederick, MD;  Location: [facility] ENDOSCOPY;  Service: Gastroenterology;  Laterality: N/A;   CORONARY ANGIOPLASTY     Status post PTCA and stenting of the first obtuse marginal-05/13/2006. We placed a 3.0 x 20 mm Taxus stent. It was post dilated using a 3.25 mm noncompliant balloon up to 14 atmospheres   IMPLANT POCKET INCISION & DRAINAGE N/A 11/02/2016   Procedure: Implant Pocket Incision & Drainage;  Surgeon: Marinus Maw, MD;  Location: [facility] INVASIVE CV LAB;  Service: Cardiovascular;  Laterality: N/A;   PPM GENERATOR CHANGEOUT N/A 11/28/2021   Procedure: PPM GENERATOR CHANGEOUT;  Surgeon: Marinus Maw, MD;  Location: [facility] INVASIVE CV LAB;  Service: Cardiovascular;  Laterality: N/A;   TONSILLECTOMY      Allergies  Allergen Reactions   Amiodarone Anaphylaxis   Ranexa [Ranolazine] Nausea And Vomiting   Other Other (See Comments)    Vitamin E - causes heart rate to increase   Demerol Other (See Comments)    Burning sensation all over body   Erythromycin Nausea And Vomiting   Pecan Nut (Diagnostic) Diarrhea   Penicillins Swelling   Percocet [Oxycodone-Acetaminophen] Swelling   Tomato Rash    In large amounts     PHYSICAL EXAM: General: The patient is alert and oriented x3 in no acute  distress.  Dermatology: Skin is warm, dry and supple bilateral lower extremities. Interspaces are clear of maceration and debris.      Wound 1 description:  Location:  distal left hallux    Depth:  closed    Wound Border:  hyperkeratotic    Wound Base:  n/a Odor?:  none    Surrounding Tissue:  clear    Infected?:  no    Necrosis?:  no    Pain?:  no    Tunneling:  no    Dimensions (cm):  n/a  Vascular: Pedal pulses are diminished   ASSESSMENT / PLAN OF CARE: 1. Diabetic ulcer of toe of left foot associated with type 2 diabetes mellitus, limited to breakdown of skin (HCC)    The previously ulcerated area of the hallux was sharply debrided of  hyperkeratotic and devitalized soft tissue with sterile #312 blade to the level of epidermis .  Patient advised to keep a close eye on the area and call if drainage or signs of infection develop.  Return in about 3 weeks (around 04/15/2023) for [facility].   Clerance Lav, DPM, FACFAS [facility]     [address] [place], [place] [address]                Office [phone]  Fax [phone]

## 2023-03-29 ENCOUNTER — Encounter (HOSPITAL_COMMUNITY): Payer: PPO

## 2023-04-08 ENCOUNTER — Ambulatory Visit: Payer: PPO

## 2023-04-08 DIAGNOSIS — I5022 Chronic systolic (congestive) heart failure: Secondary | ICD-10-CM

## 2023-04-08 DIAGNOSIS — Z950 Presence of cardiac pacemaker: Secondary | ICD-10-CM

## 2023-04-11 NOTE — Progress Notes (Signed)
Cardiology Office Note Date:  04/12/2023  Patient ID:  Shannon [name], Shannon [name] 06/01/1932, MRN [id number] PCP:  Gaspar Garbe, MD  Cardiologist:  Dr. Elease Hashimoto HF: Dr. Shirlee Latch Electrophysiologist: Dr. Ladona Ridgel    Chief Complaint:   annual visit  History of Present Illness: Shannon [name] is a 89 y.o. female with history of CAD (PCI LAD, cx 2007), HTN, HLD, DM, TIA, AFib, NICM, chronic CHF (w/recovered LVEF), CKD (III)  She saw Dr. Ladona Ridgel 03/13/22 post gen change, doing well, fairly sedentary.  Volume OL and lasix pulsed.  Following with the HF team a few times since, saw Everlean Alstrom, NP 02/15/23, had a fall a few weeks prior (mechanical). Some dizziness, using walker SOB with increased pace. Unable to tolerate losartan at even low dose.  Hold off on bisoprolol with orthostatic symptoms.  TODAY She is accompanied by her son that she lives with She had a fall a couple weeks ago, stripped on some things at the end of her bed. She had an episode of CP this morning, hed just got back to bed from using the bathroom, as she was laying back down she had a sharp "terrible" pain in the center of her chest, no radaition, no associated symptoms.  Just laid down and went to sleep.  When she woke back up she felt well. She thinks maybe was heartburn having had an unusually large meal last night, "I also slept through a heart attack years ago, so who knows!"  No SOB No near syncope or syncope No bleeding or signs of bleeding but arms bruise easily   Device information Abbott CRT-P, implanted 09/27/2016, gen change 11/28/21 RA lead is a Tendrill 2088TC, 3830 lead in LV port is in the HIS position, and a 5076 in RV port at the apex (Unable to place CS lead) AV node ablation PROGRAMMED VVIR Initial implant complicated by pocket hematoma, required evacuation and device then placed sub-pec  AAD Hx Amiodarone, stopped 2017 2/2 concerns of pulmonary tox  Past Medical History:  Diagnosis  Date   Arthritis    Baker's cyst of knee    CAD (coronary artery disease)    a. 05/2006 Cath/PCI: LAD 13m, D1 40 ost, LCX nl, OM1 95 (3.0x20 Taxus DES), RCA nl, EF 40% w/ lat AK;  04/2007 Ex MV: minimal lat ischemia in area of prior infarct->low risk-> med Rx.   Chronic atrial fibrillation (HCC)    a. on Coumadin   Chronic systolic (congestive) heart failure (HCC)    a. 04/2015: EF 50-55% b. echo 12/2015: EF 20-25% w/ diffuse HK, biatrial enlargement, mild AI and MR, severe TR     Diabetes mellitus    Hyperlipidemia    MVP (mitral valve prolapse)    a. 10/2003 Echo: nl LV fxn, mild MR/TR/AS   Myocardial infarction [facility])    Sleep apnea    Transient ischemic attack     Past Surgical History:  Procedure Laterality Date   ATRIAL FLUTTER ABLATION N/A 10/09/2012   Procedure: ATRIAL FLUTTER ABLATION;  Surgeon: Hillis Range, MD;  Location: [facility] CATH LAB;  Service: Cardiovascular;  Laterality: N/A;   AV NODE ABLATION N/A 09/27/2016   Procedure: AV Node Ablation;  Surgeon: Hillis Range, MD;  Location: [facility] INVASIVE CV LAB;  Service: Cardiovascular;  Laterality: N/A;   BIV PACEMAKER INSERTION CRT-P N/A 09/27/2016   Procedure: BiV Pacemaker Insertion CRT-P;  Surgeon: Hillis Range, MD;  Location: [facility] INVASIVE CV LAB;  Service: Cardiovascular;  Laterality: N/A;  CARDIAC CATHETERIZATION N/A 01/06/2016   Procedure: Right/Left Heart Cath and Coronary Angiography;  Surgeon: Lyn Records, MD;  Location: [facility] INVASIVE CV LAB;  Service: Cardiovascular;  Laterality: N/A;   CARDIOVERSION N/A 01/11/2016   Procedure: CARDIOVERSION;  Surgeon: Laurey Morale, MD;  Location: [facility] ENDOSCOPY;  Service: Cardiovascular;  Laterality: N/A;   cataracts     COLONOSCOPY N/A 10/13/2015   Procedure: COLONOSCOPY;  Surgeon: Ruffin Frederick, MD;  Location: [facility] ENDOSCOPY;  Service: Gastroenterology;  Laterality: N/A;   CORONARY ANGIOPLASTY     Status post PTCA and stenting of the first obtuse marginal-05/13/2006. We placed a 3.0 x  20 mm Taxus stent. It was post dilated using a 3.25 mm noncompliant balloon up to 14 atmospheres   IMPLANT POCKET INCISION & DRAINAGE N/A 11/02/2016   Procedure: Implant Pocket Incision & Drainage;  Surgeon: Marinus Maw, MD;  Location: [facility] INVASIVE CV LAB;  Service: Cardiovascular;  Laterality: N/A;   PPM GENERATOR CHANGEOUT N/A 11/28/2021   Procedure: PPM GENERATOR CHANGEOUT;  Surgeon: Marinus Maw, MD;  Location: [facility] INVASIVE CV LAB;  Service: Cardiovascular;  Laterality: N/A;   TONSILLECTOMY      Current Outpatient Medications  Medication Sig Dispense Refill   ACCU-CHEK FASTCLIX LANCETS MISC USE TO CHECK BLOOD SUGAR TWICE DAILY  5   ACCU-CHEK SMARTVIEW test strip      atorvastatin (LIPITOR) 20 MG tablet TAKE 1 TABLET BY MOUTH EVERY DAY 30 tablet 11   dapagliflozin propanediol (FARXIGA) 10 MG TABS tablet Take 1 tablet (10 mg total) by mouth daily before breakfast. 30 tablet 11   ELIQUIS 2.5 MG TABS tablet TAKE 1 TABLET (2.5 MG TOTAL) BY MOUTH 2 (TWO) TIMES DAILY. RESUME 6/4 WITH THE EVENING DOSE 60 tablet 11   gabapentin (NEURONTIN) 100 MG capsule Take 100 mg by mouth daily as needed.     Homeopathic Products ([facility] ARTHRITIS) TBDP Take 1 tablet by mouth daily as needed (mouth pain).     loratadine (CLARITIN) 10 MG tablet Take 10 mg by mouth at bedtime.     metFORMIN (GLUCOPHAGE) 500 MG tablet Take 500 mg by mouth daily.     nitroGLYCERIN (NITROSTAT) 0.4 MG SL tablet Take 1 tablet as needed. 75 tablet 1   OVER THE COUNTER MEDICATION Apply 1 application. topically daily as needed (arthritis). Artrosilium topical cream     OVER THE COUNTER MEDICATION Apply 1 application. topically daily as needed (leg pain). magnilife db foot cream     spironolactone (ALDACTONE) 25 MG tablet Take 0.5 tablets (12.5 mg total) by mouth at bedtime. 15 tablet 6   torsemide (DEMADEX) 20 MG tablet Take 20 mg by mouth daily.     No current facility-administered medications for this visit.    Allergies:    Amiodarone, Ranexa [ranolazine], Other, Demerol, Erythromycin, Pecan nut (diagnostic), Penicillins, Percocet [oxycodone-acetaminophen], and Tomato   Social History:  The patient  reports that she quit smoking about 59 years ago. Her smoking use included cigarettes. She has never used smokeless tobacco. She reports that she does not drink alcohol and does not use drugs.   Family History:  The patient's family history includes Alzheimer's disease in her sister; Coronary artery disease in her sister; Heart attack in her brother, father, mother, and sister; Stroke in her mother and sister.  ROS:  Please see the history of present illness.    All other systems are reviewed and otherwise negative.   PHYSICAL EXAM:  VS:  BP (!) 101/58  Pulse 60   Ht 5\' 2"  (1.575 m)   Wt 113 lb 9.6 oz (51.5 kg)   SpO2 97%   BMI 20.78 kg/m  BMI: Body mass index is 20.78 kg/m. Well nourished, well developed, in no acute distress HEENT: normocephalic, atraumatic Neck: no JVD, carotid bruits or masses Cardiac:  RRR; (paced) no significant murmurs, no rubs, or gallops Lungs:  CTA b/l, no wheezing, rhonchi or rales Abd: soft, nontender MS: no deformity, advanced atrophy, with a very thin body habitus Ext:  no edema Skin: warm and dry, no rash Neuro:  No gross deficits appreciated Psych: euthymic mood, full affect  PPM site is stable, no tethering or discomfort, skin looks ok, no thinning   EKG:  done today and reviewed by myself AFlutter V paced, no clear/acute ischemic changes appreciated  Device interrogation done today and reviewed by myself:  Battery and lead measurements stable No HVR episodes  11/22/21: TTE  1. Left ventricular ejection fraction, by estimation, is 50 to 55%. The  left ventricle has low normal function. The left ventricle has no regional  wall motion abnormalities. There is mild left ventricular hypertrophy.  Left ventricular diastolic  parameters are indeterminate.   2. Right  ventricular systolic function is normal. The right ventricular  size is moderately enlarged. There is mildly elevated pulmonary artery  systolic pressure. The estimated right ventricular systolic pressure is  41.6 mmHg.   3. Left atrial size was moderately dilated.   4. Right atrial size was severely dilated.   5. The mitral valve is degenerative. Mild to moderate mitral valve  regurgitation. No evidence of mitral stenosis.   6. Tricuspid valve regurgitation is severe.   7. The aortic valve is tricuspid. Aortic valve regurgitation is mild.  Aortic valve sclerosis/calcification is present, without any evidence of  aortic stenosis.   8. The inferior vena cava is dilated in size with <50% respiratory  variability, suggesting right atrial pressure of 15 mmHg.   10/28/2020: TTE  1. Left ventricular ejection fraction, by estimation, is 55%. The left  ventricle has normal function. The left ventricle has no regional wall  motion abnormalities. There is mild left ventricular hypertrophy. Left  ventricular diastolic parameters are  indeterminate.   2. Right ventricular systolic function is mildly reduced. The right  ventricular size is mildly enlarged. There is mildly elevated pulmonary  artery systolic pressure. The estimated right ventricular systolic  pressure is 40.0 mmHg.   3. Left atrial size was severely dilated.   4. Right atrial size was severely dilated.   5. The mitral valve is degenerative. Mild mitral valve regurgitation. No  evidence of mitral stenosis. Moderate mitral annular calcification.   6. Tricuspid valve regurgitation is moderate.   7. The aortic valve is tricuspid. Aortic valve regurgitation is trivial.  Mild aortic valve sclerosis is present, with no evidence of aortic valve  stenosis.   8. The inferior vena cava is normal in size with greater than 50%  respiratory variability, suggesting right atrial pressure of 3 mmHg.   Recent Labs: 09/28/2022: B Natriuretic  Peptide 760.1 01/08/2023: BUN 38; Creatinine, Ser 1.68; Hemoglobin 11.9; Platelets 181; Potassium 4.1; Sodium 132  No results found for requested labs within last 365 days.   CrCl cannot be calculated (Patient's most recent lab result is older than the maximum 21 days allowed.).   Wt Readings from Last 3 Encounters:  04/12/23 113 lb 9.6 oz (51.5 kg)  02/15/23 103 lb (46.7 kg)  01/08/23 107 lb  3.2 oz (48.6 kg)     Other studies reviewed: Additional studies/records reviewed today include: summarized above  ASSESSMENT AND PLAN:  PPM Intact function No programming changes made  Permanent Afib CHA2DS2Vasc is 9, on Eliquis,  appropriately dosed for age/weight Rate controlled s/p AV node ablation  CAD Atypical episode of CP No EKG changes Advised if recurrent to make us/Dr. McLean/team aware  NICM Chronic CHF 9systolic) She has had recovery of her LVEF by her last echo No symptoms or exam findings of volume OL CorVue is stable   Disposition: remotes as usual, bck in clinic in a year again, sooner if needed     Current medicines are reviewed at length with the patient today.  The patient did not have any concerns regarding medicines.  Norma Fredrickson, PA-C 04/12/2023 9:35 AM     [facility] [address] [place] [address] [phone] (office)  [phone] (fax)

## 2023-04-12 ENCOUNTER — Telehealth: Payer: Self-pay

## 2023-04-12 ENCOUNTER — Encounter: Payer: Self-pay | Admitting: Physician Assistant

## 2023-04-12 ENCOUNTER — Ambulatory Visit: Payer: PPO | Attending: Physician Assistant | Admitting: Physician Assistant

## 2023-04-12 VITALS — BP 101/58 | HR 60 | Ht 62.0 in | Wt 113.6 lb

## 2023-04-12 DIAGNOSIS — I428 Other cardiomyopathies: Secondary | ICD-10-CM

## 2023-04-12 DIAGNOSIS — I4892 Unspecified atrial flutter: Secondary | ICD-10-CM

## 2023-04-12 DIAGNOSIS — Z950 Presence of cardiac pacemaker: Secondary | ICD-10-CM

## 2023-04-12 DIAGNOSIS — R0789 Other chest pain: Secondary | ICD-10-CM

## 2023-04-12 DIAGNOSIS — I4821 Permanent atrial fibrillation: Secondary | ICD-10-CM

## 2023-04-12 LAB — CUP PACEART INCLINIC DEVICE CHECK
Battery Remaining Longevity: 31 mo
Battery Voltage: 2.96 V
Brady Statistic RA Percent Paced: 0 %
Brady Statistic RV Percent Paced: 98 %
Date Time Interrogation Session: 20241011133416
Implantable Lead Connection Status: 753985
Implantable Lead Connection Status: 753985
Implantable Lead Connection Status: 753985
Implantable Lead Implant Date: 20180329
Implantable Lead Implant Date: 20180329
Implantable Lead Implant Date: 20180329
Implantable Lead Location: 753859
Implantable Lead Location: 753860
Implantable Lead Location: 753860
Implantable Lead Model: 3830
Implantable Lead Model: 5076
Implantable Pulse Generator Implant Date: 20230530
Lead Channel Impedance Value: 275 Ohm
Lead Channel Impedance Value: 337.5 Ohm
Lead Channel Pacing Threshold Amplitude: 1 V
Lead Channel Pacing Threshold Amplitude: 1 V
Lead Channel Pacing Threshold Amplitude: 2.5 V
Lead Channel Pacing Threshold Amplitude: 2.5 V
Lead Channel Pacing Threshold Pulse Width: 0.5 ms
Lead Channel Pacing Threshold Pulse Width: 0.5 ms
Lead Channel Pacing Threshold Pulse Width: 1 ms
Lead Channel Pacing Threshold Pulse Width: 1 ms
Lead Channel Sensing Intrinsic Amplitude: 4.5 mV
Lead Channel Setting Pacing Amplitude: 2 V
Lead Channel Setting Pacing Amplitude: 3.5 V
Lead Channel Setting Pacing Pulse Width: 0.5 ms
Lead Channel Setting Pacing Pulse Width: 1 ms
Lead Channel Setting Sensing Sensitivity: 4 mV
Pulse Gen Model: 3222
Pulse Gen Serial Number: 8080981

## 2023-04-12 NOTE — Progress Notes (Signed)
EPIC Encounter for ICM Monitoring  Patient Name: Shannon [name] is a 89 y.o. female Date: 04/12/2023 Primary Care Physican: Gaspar Garbe, MD Primary Cardiologist: Nahser/McLean Electrophysiologist: Rae Roam Pacing: 98%          10/08/2022 Weight: 107 lbs  10/15/2022 Weight: 106.4 lbs 11/19/2022 Weight: 101.5 lbs 03/08/2023 Weight: 103 lbs                                                            Attempted call to patient and unable to reach.  Transmission reviewed.    CorVue thoracic impedance suggesting normal fluid levels.    Prescribed:  Torsemide 20 mg take 1 tablet(s) (20 mg total) by mouth daily. Spironolactone 25 mg take 0.5 tablet (12.5 mg total) by mouth daily   Labs: 02/02/2023 Creatinine 1.65, BUN 36, Potassium 4.5, Sodium 133, GFR 30 01/08/2023 Creatinine 1.68, BUN 38, Potassium 4.1, Sodium 132, GFR 29 10/12/2022 Creatinine 1.66, BUN 50, Potassium 3.9, Sodium 132, GFR 29  09/28/2022 Creatinine 1.71, BUN 31, Potassium 4.3, Sodium 135, GFR 28  09/07/2022 Creatinine 1.54, BUN 27, Potassium 4.6, Sodium 136, GFR 32  08/28/2022 Creatinine 1.45, BUN 25, Potassium 3.7, Sodium 135, GFR 35 A complete set of results can be found in Results Review.   Recommendations:  Unable to reach.     Follow-up plan: ICM clinic phone appointment on 05/13/2023.   91 day device clinic remote transmission 05/31/2023.     EP/Cardiology Office Visits:  04/17/2023 with HF clinic.  Recall 09/07/2022 with Dr Elease Hashimoto (yearly).  Recall 04/06/2024 with Francis Dowse, PA.    Copy of ICM check sent to Dr. Ladona Ridgel.    3 month ICM trend: 04/08/2023.    12-14 Month ICM trend:     Karie Soda, RN 04/12/2023 3:14 PM

## 2023-04-12 NOTE — Telephone Encounter (Signed)
Remote ICM transmission received.  Attempted call to patient regarding ICM remote transmission and no answer.  

## 2023-04-12 NOTE — Patient Instructions (Addendum)
Medication Instructions:    Your physician recommends that you continue on your current medications as directed. Please refer to the Current Medication list given to you today.   *If you need a refill on your cardiac medications before your next appointment, please call your pharmacy*   Lab Work:  NONE ORDERED  TODAY    If you have labs (blood work) drawn today and your tests are completely normal, you will receive your results only by: MyChart Message (if you have MyChart) OR A paper copy in the mail If you have any lab test that is abnormal or we need to change your treatment, we will call you to review the results.   Testing/Procedures:  NONE ORDERED  TODAY    Follow-Up: At [facility], you and your health needs are our priority.  As part of our continuing mission to provide you with exceptional heart care, we have created designated Provider Care Teams.  These Care Teams include your primary Cardiologist (physician) and Advanced Practice Providers (APPs -  Physician Assistants and Nurse Practitioners) who all work together to provide you with the care you need, when you need it.  We recommend signing up for the patient portal called "MyChart".  Sign up information is provided on this After Visit Summary.  MyChart is used to connect with patients for Virtual Visits (Telemedicine).  Patients are able to view lab/test results, encounter notes, upcoming appointments, etc.  Non-urgent messages can be sent to your provider as well.   To learn more about what you can do with MyChart, go to [email or web address].    Your next appointment:   1 year(s)  Provider:    Dr. Ladona Ridgel or one of the following Advanced Practice Providers on your designated Care Team:   Francis Dowse, [place]  Other Instructions

## 2023-04-15 ENCOUNTER — Ambulatory Visit (INDEPENDENT_AMBULATORY_CARE_PROVIDER_SITE_OTHER): Payer: PPO | Admitting: Podiatry

## 2023-04-15 VITALS — BP 109/62 | HR 62

## 2023-04-15 DIAGNOSIS — E1151 Type 2 diabetes mellitus with diabetic peripheral angiopathy without gangrene: Secondary | ICD-10-CM | POA: Diagnosis not present

## 2023-04-15 DIAGNOSIS — B351 Tinea unguium: Secondary | ICD-10-CM

## 2023-04-15 DIAGNOSIS — M79674 Pain in right toe(s): Secondary | ICD-10-CM

## 2023-04-15 DIAGNOSIS — M79675 Pain in left toe(s): Secondary | ICD-10-CM

## 2023-04-15 DIAGNOSIS — L84 Corns and callosities: Secondary | ICD-10-CM

## 2023-04-15 NOTE — Progress Notes (Unsigned)
Subjective:  Patient ID: Shannon [name], female    DOB: 1931-07-08,  MRN: [id number]  Shannon [name] presents to clinic today for:  Chief Complaint  Patient presents with   Diabetes    "It's nail trim day."   Patient notes nails are thick and elongated, causing pain in shoe gear when ambulating.    PCP is Tisovec, Adelfa Koh, MD.  Last seen 02/04/23.  Past Medical History:  Diagnosis Date   Arthritis    Baker's cyst of knee    CAD (coronary artery disease)    a. 05/2006 Cath/PCI: LAD 29m, D1 40 ost, LCX nl, OM1 95 (3.0x20 Taxus DES), RCA nl, EF 40% w/ lat AK;  04/2007 Ex MV: minimal lat ischemia in area of prior infarct->low risk-> med Rx.   Chronic atrial fibrillation (HCC)    a. on Coumadin   Chronic systolic (congestive) heart failure (HCC)    a. 04/2015: EF 50-55% b. echo 12/2015: EF 20-25% w/ diffuse HK, biatrial enlargement, mild AI and MR, severe TR     Diabetes mellitus    Hyperlipidemia    MVP (mitral valve prolapse)    a. 10/2003 Echo: nl LV fxn, mild MR/TR/AS   Myocardial infarction [facility])    Sleep apnea    Transient ischemic attack     Allergies  Allergen Reactions   Amiodarone Anaphylaxis   Ranexa [Ranolazine] Nausea And Vomiting   Other Other (See Comments)    Vitamin E - causes heart rate to increase   Demerol Other (See Comments)    Burning sensation all over body   Erythromycin Nausea And Vomiting   Pecan Nut (Diagnostic) Diarrhea   Penicillins Swelling   Percocet [Oxycodone-Acetaminophen] Swelling   Tomato Rash    In large amounts     Objective:  Vitals:   04/15/23 1340  BP: 109/62  Pulse: 62    Shannon [name] is a pleasant 89 y.o. female in NAD. AAO x 3.  Vascular Examination: Patient has palpable DP pulse, absent PT pulse bilateral.  Delayed capillary refill bilateral toes.  Sparse digital hair bilateral.  Proximal to distal cooling WNL bilateral.    Dermatological Examination: Interspaces are clear with no open lesions  noted bilateral.  Skin is shiny and atrophic bilateral.  Nails are 3-24mm thick, with yellowish/brown discoloration, subungual debris and distal onycholysis x9.  There is pain with compression of nails x9.  There are hyperkeratotic lesions noted distal left hallux.  Right 2nd interspace has small amt. Of dry blood present, but no open lesions noted.  Left hallux nail was removed in the past.   Patient qualifies for at-risk foot care because of PVD, pain in nails .  Assessment/Plan: 1. Pain due to onychomycosis of toenails of both feet   2. Callus of foot   3. Type II diabetes mellitus with peripheral circulatory disorder (HCC)    Mycotic nails x9 were sharply debrided with sterile nail nippers and power debriding burr to decrease bulk and length.  Hyperkeratotic lesion on distal left hallux shaved with #312 blade.  Return in about 3 months (around 07/16/2023) for [facility].   Clerance Lav, DPM, FACFAS [facility]     [address]                                        [place], [place]  [address]                Office [phone]  Fax [phone]

## 2023-04-16 NOTE — Progress Notes (Incomplete)
ID:  Shannon [name], DOB 1931-11-11, MRN [id number]  Provider location:  Advanced Heart Failure Type of Visit: Established patient   PCP:  Tisovec, Adelfa Koh, MD  Cardiologist:  Kristeen Miss, MD HF Cardiology: Dr. Shirlee Latch   History of Present Illness: Shannon [name] is a 89 y.o. female who has a history of paroxysmal atrial fibrillation, chronic systolic CHF, and CAD. She apparently had been in persistent atrial fibrillation since around 8/16.  It appears that she was tachycardic most of the time prior to her recent admission in 7/17.  She had been on amiodarone prior to admission but it was not keeping her in NSR.  She was admitted with acute systolic CHF.  EF had dropped to 20-25% in 7/17 from 50-55% in 10/16.  She was in atrial fibrillation with RVR.  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  Amiodarone was stopped due to suspicion for amiodarone-induced lung toxicity (she was seen by pulmonary).  She was eventually cardioverted to NSR.  Ranolazine was started to try to keep her in NSR.  HR was low in the hospital so she has not been on a beta blocker.  She was extensively diuresed and discharged to SNF.  After discharge, she developed persistent nausea.  Eventually, it was found that the nausea was likely from ranolazine, so this medication was stopped and the nausea resolved.     Patient continued to have difficult-to-control atrial fibrillation.  GFR was too low for Tikosyn.  She therefore had AV nodal ablation with St Jude CRT-P device placed in 3/18.  She developed a pocket hematoma and had pocket revision with sub-pectoral placement in 5/18.    Echo in 9/19 showed EF up to 60-65%, moderate MR, moderate TR, PASP 43 mmHg. Echo in 10/20 showed EF 55-60%, moderately decreased RV systolic function with moderate RV enlargement, moderate-severe biatrial enlargement, moderate-severe TR, mild MR.   Echo in 4/22 showed EF 55% with  mild LVH, mild RV enlargement with mildly decreased RV systolic function, severe biatrial enlargement, mild MR, moderate TR.  Echo in 5/23 showed EF 50-55%, moderate RV enlargement with normal systolic function, PASP 42 mmHg, moderate MR, severe TR.   Patient was admitted to the hospital in [facility] in 12/23 with CHF, predominantly RV failure.  Echo there showed EF 45-50%, lateral hypokinesis, severe RV dilation with mildly decreased RV systolic function, severe TR.   Follow up 7/24, she was down 17 lbs, was lightheaded and had a recent fall w/o injury. Felt to be hypovolemic, torsemide held x 2 days then reduced to 20 mg daily.  Today she returns for HF follow up with her son. Overall feeling fine. She had a mechanical fall 2 weeks ago, slipped on gravel. She has occasional dizzy spells. She walks with a walker around her home, she has SOB if she pushes herself rushing with ADLs. Feels occasional palpitations. Denies abnormal bleeding, CP, dizziness, edema, or PND/Orthopnea. Appetite ok. No fever or chills. Taking all medications.   ReDs: 30%  ECG (personally reviewed): none ordered today.  St Jude device interrogation: stable thoracic impedance, >99% BiV pacing.    Labs (6/19): K 4.4, creatinine 1.7, hgb 12.8, LDL 54 Labs (10/19): K 4.2, creatinine 1.46 Labs (6/21): K 4.6, creatinine 1.5, LDL 43, HDL 51 Labs (3/22): BNP 635, LFTs normal, K 3.8, creatinine 1.81 Labs (5/23): K 4.3, creatinine 1.63 => 1.67 Labs (9/23): K 4.0, creatinine 2.11 Labs (11/23): K 5.1, creatinine 1.74  Labs (1/24): BNP 704, K 3.3, creatinine 2.54 => 1.68 Labs (4/24): K 3.9, creatinine 1.66 Labs (8/24): K 4.5, creatinine 1.65    PMH: 1. Atrial fibrillation: Paroxysmal.  Had been persistent since 8/16, was cardioverted to NSR during 7/17 admission.  Possible atypical atrial flutter on 12/17 ECG.  By 3/18, atrial fibrillation again.  - Nausea with ranolazine used for atrial fibrillation suppression.  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  - AV nodal ablation 3/18 with CRT-P.  2. 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  She had been in atrial fibrillation with elevated rate possibly for months prior to 7/17 admission.  - Echo (10/16): EF 50-55%.  - Echo (7/17): EF 20-25%, mildly decreased RV systolic function, severe TR, PA systolic pressure 64 mmHg.  - LHC/RHC (7/17): Patent OM stent, no obstructive CAD.  Mean RA 13, PA 45/25, mean PCWP 16, CI 2.36.  - Echo (1/18): EF 30-35% with diffuse hypokinesis, mildly decreased RV systolic function, moderate TR and MR, PASP 53 mmHg, severe LAE.  - St Jude CRT-P placed with AV nodal ablation in 3/18.  - Echo (9/19): EF 60-65%, moderate LVH, grade 3 diastolic dysfunction, mild MR, mild AI, moderate TR, PASP 43 mmHg.  - Echo (10/20): EF 55-60%, moderately decreased RV systolic function with moderate RV enlargement, moderate-severe biatrial enlargement, moderate-severe TR, mild MR.  - Echo (4/22): EF 55% with mild LVH, mild RV enlargement with mildly decreased RV systolic function, severe biatrial enlargement, mild MR, moderate TR.  - Echo (5/23): EF 50-55%, moderate RV enlargement with normal systolic function, PASP 42 mmHg, moderate MR, severe TR.  - Echo (12/23): EF 45-50%, lateral hypokinesis, severe RV dilation with mildly decreased RV systolic function, severe TR.  3. CAD: DES to OM in 2007.  LHC (7/17) with patent stent and no new obstructive disease.  4. Type II diabetes 5. Hyperlipidemia 6. TIA 7. Possible amiodarone lung toxicity: Chest CT (7/17) with bronchiectasis/fibrosis, patchy bilateral densities.  8. CKD stage 3  Past Surgical History:  Procedure Laterality Date   ATRIAL FLUTTER ABLATION N/A 10/09/2012   Procedure: ATRIAL FLUTTER ABLATION;  Surgeon: Hillis Range, MD;  Location: [facility] CATH LAB;  Service: Cardiovascular;  Laterality: N/A;   AV NODE ABLATION N/A 09/27/2016   Procedure: AV Node  Ablation;  Surgeon: Hillis Range, MD;  Location: [facility] INVASIVE CV LAB;  Service: Cardiovascular;  Laterality: N/A;   BIV PACEMAKER INSERTION CRT-P N/A 09/27/2016   Procedure: BiV Pacemaker Insertion CRT-P;  Surgeon: Hillis Range, MD;  Location: [facility] INVASIVE CV LAB;  Service: Cardiovascular;  Laterality: N/A;   CARDIAC CATHETERIZATION N/A 01/06/2016   Procedure: Right/Left Heart Cath and Coronary Angiography;  Surgeon: Lyn Records, MD;  Location: [facility] INVASIVE CV LAB;  Service: Cardiovascular;  Laterality: N/A;   CARDIOVERSION N/A 01/11/2016   Procedure: CARDIOVERSION;  Surgeon: Laurey Morale, MD;  Location: [facility] ENDOSCOPY;  Service: Cardiovascular;  Laterality: N/A;   cataracts     COLONOSCOPY N/A 10/13/2015   Procedure: COLONOSCOPY;  Surgeon: Ruffin Frederick, MD;  Location: [facility] ENDOSCOPY;  Service: Gastroenterology;  Laterality: N/A;   CORONARY ANGIOPLASTY     Status post PTCA and stenting of the first obtuse marginal-05/13/2006. We placed a 3.0 x 20 mm Taxus stent. It was post dilated using a 3.25 mm noncompliant balloon up to 14 atmospheres   IMPLANT POCKET INCISION & DRAINAGE N/A 11/02/2016   Procedure: Implant Pocket Incision & Drainage;  Surgeon: Marinus Maw, MD;  Location: [facility] INVASIVE CV LAB;  Service: Cardiovascular;  Laterality: N/A;   PPM GENERATOR CHANGEOUT N/A 11/28/2021   Procedure: PPM GENERATOR CHANGEOUT;  Surgeon: Marinus Maw, MD;  Location: [facility] INVASIVE CV LAB;  Service: Cardiovascular;  Laterality: N/A;   TONSILLECTOMY     Current Outpatient Medications  Medication Sig Dispense Refill   ACCU-CHEK FASTCLIX LANCETS MISC USE TO CHECK BLOOD SUGAR TWICE DAILY  5   ACCU-CHEK SMARTVIEW test strip      atorvastatin (LIPITOR) 20 MG tablet TAKE 1 TABLET BY MOUTH EVERY DAY 30 tablet 11   dapagliflozin propanediol (FARXIGA) 10 MG TABS tablet Take 1 tablet (10 mg total) by mouth daily before breakfast. 30 tablet 11   ELIQUIS 2.5 MG TABS tablet TAKE 1 TABLET (2.5 MG TOTAL) BY MOUTH 2  (TWO) TIMES DAILY. RESUME 6/4 WITH THE EVENING DOSE 60 tablet 11   gabapentin (NEURONTIN) 100 MG capsule Take 100 mg by mouth daily as needed.     Homeopathic Products (ARNICARE ARTHRITIS) TBDP Take 1 tablet by mouth daily as needed (mouth pain).     loratadine (CLARITIN) 10 MG tablet Take 10 mg by mouth at bedtime.     metFORMIN (GLUCOPHAGE) 500 MG tablet Take 500 mg by mouth daily.     nitroGLYCERIN (NITROSTAT) 0.4 MG SL tablet Take 1 tablet as needed. 75 tablet 1   OVER THE COUNTER MEDICATION Apply 1 application. topically daily as needed (arthritis). Artrosilium topical cream     OVER THE COUNTER MEDICATION Apply 1 application. topically daily as needed (leg pain). magnilife db foot cream     spironolactone (ALDACTONE) 25 MG tablet Take 0.5 tablets (12.5 mg total) by mouth at bedtime. 15 tablet 6   torsemide (DEMADEX) 20 MG tablet Take 20 mg by mouth daily.     No current facility-administered medications for this visit.   Allergies:   Amiodarone, Ranexa [ranolazine], Other, Demerol, Erythromycin, Pecan nut (diagnostic), Penicillins, Percocet [oxycodone-acetaminophen], and Tomato   Social History:  The patient  reports that she quit smoking about 59 years ago. Her smoking use included cigarettes. She has never used smokeless tobacco. She reports that she does not drink alcohol and does not use drugs.   Family History:  The patient's family history includes Alzheimer's disease in her sister; Coronary artery disease in her sister; Heart attack in her brother, father, mother, and sister; Stroke in her mother and sister.   ROS:  Please see the history of present illness.   All other systems are personally reviewed and negative.   Recent Labs: 09/28/2022: B Natriuretic Peptide 760.1 01/08/2023: BUN 38; Creatinine, Ser 1.68; Hemoglobin 11.9; Platelets 181; Potassium 4.1; Sodium 132  Personally reviewed   Wt Readings from Last 3 Encounters:  04/12/23 51.5 kg (113 lb 9.6 oz)  02/15/23 46.7 kg  (103 lb)  01/08/23 48.6 kg (107 lb 3.2 oz)   There were no vitals taken for this visit. Physical Exam General:  NAD. No resp difficulty, arrived in [facility] HEENT: Normal Neck: Supple. No JVD. Carotids 2+ bilat; no bruits. No lymphadenopathy or thryomegaly appreciated. Cor: PMI nondisplaced. Regular rate & rhythm. No rubs, gallops, 2/6 HSM LLSB Lungs: Clear Abdomen: Soft, nontender, nondistended. No hepatosplenomegaly. No bruits or masses. Good bowel sounds. Extremities: No cyanosis, clubbing, rash, edema Neuro: Alert & oriented x 3, cranial nerves grossly intact. Moves all 4 extremities w/o difficulty. Affect pleasant.  Assessment/Plan 1. Chronic systolic=>diastolic CHF: EF 19-14% on 7/17 echo, 30-35% on 1/18 echo.  Nonischemic cardiomyopathy, possible role for tachycardia-mediated cardiomyopathy (some improvement in  EF with rate control but not back to normal). ECHO 03/2017 EF 40-45%.  She had AV nodal ablation due to difficult-to-control atrial fibrillation in 3/18 with St Jude CRT-P placement. Repeat echo in 9/19 showed EF up to 60-65%.  Echo in 10/20 with EF 55-60%, moderately decreased RV systolic function with moderate RV enlargement, moderate-severe biatrial enlargement, moderate-severe TR, mild MR.  Echo in 4/22 with EF 55% with mild LVH, mild RV enlargement with mildly decreased RV systolic function, severe biatrial enlargement, mild MR, moderate TR.  Echo (5/23) with EF 50-55%, moderate RV enlargement with normal systolic function, PASP 42 mmHg, moderate MR, severe TR.  Echo in 12/23 with EF 45-50%, lateral hypokinesis, severe RV dilation with mildly decreased RV systolic function, severe TR.  At this point, she appears to have predominantly RV failure with severe TR.  This is complicated by CKD stage 3b. She looks good today, stable NYHA III symptoms. She is not volume overloaded by exam or CorVue. ReDs 30% - With lightheadedness, decrease spironolactone to 12.5 mg every evening. Recent labs  reviewed and are stable, K 4.5, creatinine 1.65 - Continue torsemide 20 mg daily.   - Continue dapagliflozin 10 mg daily. No GU symptoms.  - Unable to tolerate losartan at even low dose.  - Hold off on bisoprolol with orthostatic symptoms.     - Wear compression stockings during the day.  2. Atrial fibrillation/flutter:  Concern for prior tachy-mediated cardiomyopathy.  She had suspected amiodarone-induced lung toxicity so ranolazine was started.  She did not tolerate ranolazine due to nausea. Poor Tikosyn candidate with CKD.   She tolerated atrial fibrillation poorly and rate was difficult to control, so she had AV nodal ablation with St Jude CRT-P device.  She is in chronic AF.  - Continue apixaban 2.5 mg bid (age, creatinine). No bleeding issues. Recent CBC stable, Hgb 11.7  3. CAD: Patent OM stent on coronary angiography in 7/17.  No chest pain.  - Continue statin. - No ASA with apixaban use.   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  Off amiodarone.  Follows with pulmonary.  5. CKD stage 3: Recent BMET with creatinine 1.6.   - Continue Farxiga 10 mg daily.  Follow up in 6 weeks with APP and 4 months with Dr. Shirlee Latch. I am concerned that her combination of RV failure and renal failure is going to be difficult to manage.   Signed, Jacklynn Ganong, FNP  04/16/2023  [facility] [address] [facility] [place] [address] [phone] (office) [phone] (fax)

## 2023-04-17 ENCOUNTER — Ambulatory Visit (HOSPITAL_COMMUNITY)
Admission: RE | Admit: 2023-04-17 | Discharge: 2023-04-17 | Disposition: A | Discharge status: Home / Self Care | Payer: PPO | Source: Ambulatory Visit | Attending: Family Medicine | Admitting: Family Medicine

## 2023-04-17 ENCOUNTER — Encounter (HOSPITAL_COMMUNITY): Payer: Self-pay

## 2023-04-17 VITALS — BP 98/64 | HR 60 | Wt 110.0 lb

## 2023-04-17 DIAGNOSIS — I428 Other cardiomyopathies: Secondary | ICD-10-CM | POA: Diagnosis not present

## 2023-04-17 DIAGNOSIS — I5022 Chronic systolic (congestive) heart failure: Secondary | ICD-10-CM | POA: Diagnosis not present

## 2023-04-17 DIAGNOSIS — N183 Chronic kidney disease, stage 3 unspecified: Secondary | ICD-10-CM | POA: Diagnosis not present

## 2023-04-17 DIAGNOSIS — I4892 Unspecified atrial flutter: Secondary | ICD-10-CM | POA: Diagnosis not present

## 2023-04-17 DIAGNOSIS — N1832 Chronic kidney disease, stage 3b: Secondary | ICD-10-CM | POA: Insufficient documentation

## 2023-04-17 DIAGNOSIS — I2510 Atherosclerotic heart disease of native coronary artery without angina pectoris: Secondary | ICD-10-CM | POA: Diagnosis not present

## 2023-04-17 DIAGNOSIS — I480 Paroxysmal atrial fibrillation: Secondary | ICD-10-CM | POA: Diagnosis not present

## 2023-04-17 DIAGNOSIS — Z7901 Long term (current) use of anticoagulants: Secondary | ICD-10-CM | POA: Insufficient documentation

## 2023-04-17 DIAGNOSIS — I4821 Permanent atrial fibrillation: Secondary | ICD-10-CM

## 2023-04-17 LAB — BASIC METABOLIC PANEL
Anion gap: 11 (ref 5–15)
BUN: 35 mg/dL — ABNORMAL HIGH (ref 8–23)
CO2: 26 mmol/L (ref 22–32)
Calcium: 9.4 mg/dL (ref 8.9–10.3)
Chloride: 101 mmol/L (ref 98–111)
Creatinine, Ser: 1.56 mg/dL — ABNORMAL HIGH (ref 0.44–1.00)
GFR, Estimated: 32 mL/min — ABNORMAL LOW (ref >60)
Glucose, Bld: 115 mg/dL — ABNORMAL HIGH (ref 70–99)
Potassium: 3.5 mmol/L (ref 3.5–5.1)
Sodium: 138 mmol/L (ref 135–145)

## 2023-04-17 LAB — BRAIN NATRIURETIC PEPTIDE: B Natriuretic Peptide: 399.5 pg/mL — ABNORMAL HIGH (ref 0.0–100.0)

## 2023-04-17 NOTE — Patient Instructions (Signed)
Medication Changes:  None  Lab Work:  Labs today We will only contact you if something comes back abnormal or we need to make some changes. Otherwise no news is good news!   Testing/Procedures:  None  Referrals:  None  Special Instructions // Education:  None  Follow-Up in: Keep follow up with Dr. Shirlee Latch  At the [facility], you and your health needs are our priority. We have a designated team specialized in the treatment of Heart Failure. This Care Team includes your primary Heart Failure Specialized Cardiologist (physician), Advanced Practice Providers (APPs- Physician Assistants and Nurse Practitioners), and Pharmacist who all work together to provide you with the care you need, when you need it.   You may see any of the following providers on your designated Care Team at your next follow up:  Dr. Arvilla Meres Dr. Marca Ancona Dr. Dorthula Nettles Dr. Theresia Bough Tonye Becket, NP Robbie Lis, [place] [facility] [place], [place] Brynda Peon, NP [place] Lee, NP Karle Plumber, PharmD   Please be sure to bring in all your medications bottles to every appointment.   Need to Contact us:  If you have any questions or concerns before your next appointment please send [place] a message through [place] or call our office at [phone].    TO LEAVE A MESSAGE FOR THE NURSE SELECT OPTION 2, PLEASE LEAVE A MESSAGE INCLUDING: YOUR NAME DATE OF BIRTH CALL BACK NUMBER REASON FOR CALL**this is important as we prioritize the call backs  YOU WILL RECEIVE A CALL BACK THE SAME DAY AS LONG AS YOU CALL BEFORE 4:00 PM

## 2023-04-17 NOTE — Addendum Note (Signed)
Encounter addended by: Jacklynn Ganong, FNP on: 04/17/2023 1:26 PM  Actions taken: Clinical Note Signed

## 2023-04-24 DIAGNOSIS — I482 Chronic atrial fibrillation, unspecified: Secondary | ICD-10-CM | POA: Diagnosis not present

## 2023-04-24 DIAGNOSIS — D638 Anemia in other chronic diseases classified elsewhere: Secondary | ICD-10-CM | POA: Diagnosis not present

## 2023-04-24 DIAGNOSIS — M79672 Pain in left foot: Secondary | ICD-10-CM | POA: Diagnosis not present

## 2023-04-24 DIAGNOSIS — L03116 Cellulitis of left lower limb: Secondary | ICD-10-CM | POA: Diagnosis not present

## 2023-04-24 DIAGNOSIS — E1140 Type 2 diabetes mellitus with diabetic neuropathy, unspecified: Secondary | ICD-10-CM | POA: Diagnosis not present

## 2023-04-24 DIAGNOSIS — R319 Hematuria, unspecified: Secondary | ICD-10-CM | POA: Diagnosis not present

## 2023-04-24 DIAGNOSIS — I509 Heart failure, unspecified: Secondary | ICD-10-CM | POA: Diagnosis not present

## 2023-04-24 DIAGNOSIS — N184 Chronic kidney disease, stage 4 (severe): Secondary | ICD-10-CM | POA: Diagnosis not present

## 2023-04-24 DIAGNOSIS — I1310 Hypertensive heart and chronic kidney disease without heart failure, with stage 1 through stage 4 chronic kidney disease, or unspecified chronic kidney disease: Secondary | ICD-10-CM | POA: Diagnosis not present

## 2023-04-24 DIAGNOSIS — E1151 Type 2 diabetes mellitus with diabetic peripheral angiopathy without gangrene: Secondary | ICD-10-CM | POA: Diagnosis not present

## 2023-04-24 DIAGNOSIS — Z7901 Long term (current) use of anticoagulants: Secondary | ICD-10-CM | POA: Diagnosis not present

## 2023-04-30 ENCOUNTER — Encounter: Payer: Self-pay | Admitting: Cardiology

## 2023-05-13 ENCOUNTER — Ambulatory Visit: Payer: PPO | Attending: Internal Medicine

## 2023-05-13 DIAGNOSIS — Z950 Presence of cardiac pacemaker: Secondary | ICD-10-CM | POA: Diagnosis not present

## 2023-05-13 DIAGNOSIS — I5022 Chronic systolic (congestive) heart failure: Secondary | ICD-10-CM | POA: Diagnosis not present

## 2023-05-17 NOTE — Progress Notes (Signed)
EPIC Encounter for ICM Monitoring  Patient Name: Shannon [name] is a 89 y.o. female Date: 05/17/2023 Primary Care Physican: Gaspar Garbe, MD Primary Cardiologist: Nah[place]/McLean Electrophysiologist: Rae Roam Pacing: 99%          10/08/2022 Weight: 107 lbs  10/15/2022 Weight: 106.4 lbs 11/19/2022 Weight: 101.5 lbs 03/08/2023 Weight: 103 lbs                                                            Transmission reviewed.    CorVue thoracic impedance suggesting normal fluid levels.    Prescribed:  Torsemide 20 mg take 1 tablet(s) (20 mg total) by mouth daily. Spironolactone 25 mg take 0.5 tablet (12.5 mg total) by mouth daily   Labs: 04/17/2023 Creatinine 1.56, BUN 35, Potassium 3.5, Sodium 138, GFR 32 02/02/2023 Creatinine 1.65, BUN 36, Potassium 4.5, Sodium 133, GFR 30 01/08/2023 Creatinine 1.68, BUN 38, Potassium 4.1, Sodium 132, GFR 29 10/12/2022 Creatinine 1.66, BUN 50, Potassium 3.9, Sodium 132, GFR 29  09/28/2022 Creatinine 1.71, BUN 31, Potassium 4.3, Sodium 135, GFR 28  09/07/2022 Creatinine 1.54, BUN 27, Potassium 4.6, Sodium 136, GFR 32  08/28/2022 Creatinine 1.45, BUN 25, Potassium 3.7, Sodium 135, GFR 35 A complete set of results can be found in Results Review.   Recommendations:  No changes.     Follow-up plan: ICM clinic phone appointment on 07/01/2023.   91 day device clinic remote transmission 05/31/2023.     EP/Cardiology Office Visits:  06/10/2023 with HF clinic.  Recall 09/07/2022 with Dr Elease Hashimoto (yearly).  Recall 04/06/2024 with Francis Dowse, PA.    Copy of ICM check sent to Dr. Ladona Ridgel.    3 month ICM trend: 05/13/2023.    12-14 Month ICM trend:     Karie Soda, RN 05/17/2023 10:09 AM

## 2023-05-27 DIAGNOSIS — D638 Anemia in other chronic diseases classified elsewhere: Secondary | ICD-10-CM | POA: Diagnosis not present

## 2023-05-27 DIAGNOSIS — I87319 Chronic venous hypertension (idiopathic) with ulcer of unspecified lower extremity: Secondary | ICD-10-CM | POA: Diagnosis not present

## 2023-05-27 DIAGNOSIS — E1140 Type 2 diabetes mellitus with diabetic neuropathy, unspecified: Secondary | ICD-10-CM | POA: Diagnosis not present

## 2023-05-27 DIAGNOSIS — T148XXA Other injury of unspecified body region, initial encounter: Secondary | ICD-10-CM | POA: Diagnosis not present

## 2023-05-27 DIAGNOSIS — R7989 Other specified abnormal findings of blood chemistry: Secondary | ICD-10-CM | POA: Diagnosis not present

## 2023-05-27 DIAGNOSIS — N184 Chronic kidney disease, stage 4 (severe): Secondary | ICD-10-CM | POA: Diagnosis not present

## 2023-05-27 DIAGNOSIS — I509 Heart failure, unspecified: Secondary | ICD-10-CM | POA: Diagnosis not present

## 2023-05-27 DIAGNOSIS — Z7901 Long term (current) use of anticoagulants: Secondary | ICD-10-CM | POA: Diagnosis not present

## 2023-05-27 DIAGNOSIS — I739 Peripheral vascular disease, unspecified: Secondary | ICD-10-CM | POA: Diagnosis not present

## 2023-05-27 DIAGNOSIS — I130 Hypertensive heart and chronic kidney disease with heart failure and stage 1 through stage 4 chronic kidney disease, or unspecified chronic kidney disease: Secondary | ICD-10-CM | POA: Diagnosis not present

## 2023-05-27 DIAGNOSIS — I209 Angina pectoris, unspecified: Secondary | ICD-10-CM | POA: Diagnosis not present

## 2023-05-27 DIAGNOSIS — Z23 Encounter for immunization: Secondary | ICD-10-CM | POA: Diagnosis not present

## 2023-05-27 DIAGNOSIS — I482 Chronic atrial fibrillation, unspecified: Secondary | ICD-10-CM | POA: Diagnosis not present

## 2023-05-31 ENCOUNTER — Ambulatory Visit (INDEPENDENT_AMBULATORY_CARE_PROVIDER_SITE_OTHER): Payer: PPO

## 2023-05-31 DIAGNOSIS — I428 Other cardiomyopathies: Secondary | ICD-10-CM | POA: Diagnosis not present

## 2023-06-01 LAB — CUP PACEART REMOTE DEVICE CHECK
Battery Remaining Longevity: 32 mo
Battery Remaining Percentage: 70 %
Battery Voltage: 2.96 V
Date Time Interrogation Session: 20241129020015
Implantable Lead Connection Status: 753985
Implantable Lead Connection Status: 753985
Implantable Lead Connection Status: 753985
Implantable Lead Implant Date: 20180329
Implantable Lead Implant Date: 20180329
Implantable Lead Implant Date: 20180329
Implantable Lead Location: 753859
Implantable Lead Location: 753860
Implantable Lead Location: 753860
Implantable Lead Model: 3830
Implantable Lead Model: 5076
Implantable Pulse Generator Implant Date: 20230530
Lead Channel Impedance Value: 280 Ohm
Lead Channel Impedance Value: 340 Ohm
Lead Channel Pacing Threshold Amplitude: 1 V
Lead Channel Pacing Threshold Amplitude: 2.5 V
Lead Channel Pacing Threshold Pulse Width: 0.5 ms
Lead Channel Pacing Threshold Pulse Width: 1 ms
Lead Channel Sensing Intrinsic Amplitude: 5.2 mV
Lead Channel Setting Pacing Amplitude: 2 V
Lead Channel Setting Pacing Amplitude: 3.5 V
Lead Channel Setting Pacing Pulse Width: 0.5 ms
Lead Channel Setting Pacing Pulse Width: 1 ms
Lead Channel Setting Sensing Sensitivity: 4 mV
Pulse Gen Model: 3222
Pulse Gen Serial Number: 8080981

## 2023-06-04 DIAGNOSIS — Z955 Presence of coronary angioplasty implant and graft: Secondary | ICD-10-CM | POA: Diagnosis not present

## 2023-06-04 DIAGNOSIS — I509 Heart failure, unspecified: Secondary | ICD-10-CM | POA: Diagnosis not present

## 2023-06-04 DIAGNOSIS — F0390 Unspecified dementia without behavioral disturbance: Secondary | ICD-10-CM | POA: Diagnosis not present

## 2023-06-04 DIAGNOSIS — Z515 Encounter for palliative care: Secondary | ICD-10-CM | POA: Diagnosis not present

## 2023-06-04 DIAGNOSIS — Z8673 Personal history of transient ischemic attack (TIA), and cerebral infarction without residual deficits: Secondary | ICD-10-CM | POA: Diagnosis not present

## 2023-06-04 DIAGNOSIS — I2510 Atherosclerotic heart disease of native coronary artery without angina pectoris: Secondary | ICD-10-CM | POA: Diagnosis not present

## 2023-06-04 DIAGNOSIS — E261 Secondary hyperaldosteronism: Secondary | ICD-10-CM | POA: Diagnosis not present

## 2023-06-07 ENCOUNTER — Telehealth (HOSPITAL_COMMUNITY): Payer: Self-pay | Admitting: Cardiology

## 2023-06-07 NOTE — Telephone Encounter (Signed)
Front office called patient at [phone] and left a message (reminder call) about appt with Dr. Shirlee Latch on 06/10/2023. Also called patient at [phone] and the voicemail box was full - - unable to reach patient on 12/06.

## 2023-06-10 ENCOUNTER — Ambulatory Visit (HOSPITAL_COMMUNITY)
Admission: RE | Admit: 2023-06-10 | Discharge: 2023-06-10 | Disposition: A | Discharge status: Home / Self Care | Payer: PPO | Source: Ambulatory Visit | Attending: Cardiology | Admitting: Cardiology

## 2023-06-10 ENCOUNTER — Encounter (HOSPITAL_COMMUNITY): Payer: Self-pay | Admitting: Cardiology

## 2023-06-10 VITALS — Wt 110.6 lb

## 2023-06-10 DIAGNOSIS — E1122 Type 2 diabetes mellitus with diabetic chronic kidney disease: Secondary | ICD-10-CM | POA: Insufficient documentation

## 2023-06-10 DIAGNOSIS — I4819 Other persistent atrial fibrillation: Secondary | ICD-10-CM | POA: Insufficient documentation

## 2023-06-10 DIAGNOSIS — Z7984 Long term (current) use of oral hypoglycemic drugs: Secondary | ICD-10-CM | POA: Diagnosis not present

## 2023-06-10 DIAGNOSIS — Z79899 Other long term (current) drug therapy: Secondary | ICD-10-CM | POA: Diagnosis not present

## 2023-06-10 DIAGNOSIS — Z87891 Personal history of nicotine dependence: Secondary | ICD-10-CM | POA: Insufficient documentation

## 2023-06-10 DIAGNOSIS — I428 Other cardiomyopathies: Secondary | ICD-10-CM | POA: Diagnosis not present

## 2023-06-10 DIAGNOSIS — E1136 Type 2 diabetes mellitus with diabetic cataract: Secondary | ICD-10-CM | POA: Insufficient documentation

## 2023-06-10 DIAGNOSIS — N1832 Chronic kidney disease, stage 3b: Secondary | ICD-10-CM | POA: Diagnosis not present

## 2023-06-10 DIAGNOSIS — Z7901 Long term (current) use of anticoagulants: Secondary | ICD-10-CM | POA: Diagnosis not present

## 2023-06-10 DIAGNOSIS — I2510 Atherosclerotic heart disease of native coronary artery without angina pectoris: Secondary | ICD-10-CM | POA: Diagnosis not present

## 2023-06-10 DIAGNOSIS — I4892 Unspecified atrial flutter: Secondary | ICD-10-CM | POA: Diagnosis not present

## 2023-06-10 DIAGNOSIS — I5022 Chronic systolic (congestive) heart failure: Secondary | ICD-10-CM | POA: Insufficient documentation

## 2023-06-10 DIAGNOSIS — I5032 Chronic diastolic (congestive) heart failure: Secondary | ICD-10-CM | POA: Diagnosis not present

## 2023-06-10 DIAGNOSIS — E785 Hyperlipidemia, unspecified: Secondary | ICD-10-CM | POA: Insufficient documentation

## 2023-06-10 LAB — CBC
HCT: 35.2 % — ABNORMAL LOW (ref 36.0–46.0)
Hemoglobin: 11.8 g/dL — ABNORMAL LOW (ref 12.0–15.0)
MCH: 34 pg (ref 26.0–34.0)
MCHC: 33.5 g/dL (ref 30.0–36.0)
MCV: 101.4 fL — ABNORMAL HIGH (ref 80.0–100.0)
Platelets: 179 10*3/uL (ref 150–400)
RBC: 3.47 MIL/uL — ABNORMAL LOW (ref 3.87–5.11)
RDW: 17.2 % — ABNORMAL HIGH (ref 11.5–15.5)
WBC: 6.4 10*3/uL (ref 4.0–10.5)
nRBC: 0 % (ref 0.0–0.2)

## 2023-06-10 LAB — BASIC METABOLIC PANEL
Anion gap: 12 (ref 5–15)
BUN: 33 mg/dL — ABNORMAL HIGH (ref 8–23)
CO2: 25 mmol/L (ref 22–32)
Calcium: 9.5 mg/dL (ref 8.9–10.3)
Chloride: 98 mmol/L (ref 98–111)
Creatinine, Ser: 1.76 mg/dL — ABNORMAL HIGH (ref 0.44–1.00)
GFR, Estimated: 27 mL/min — ABNORMAL LOW (ref >60)
Glucose, Bld: 127 mg/dL — ABNORMAL HIGH (ref 70–99)
Potassium: 3.8 mmol/L (ref 3.5–5.1)
Sodium: 135 mmol/L (ref 135–145)

## 2023-06-10 LAB — LIPID PANEL
Cholesterol: 112 mg/dL (ref 0–200)
HDL: 55 mg/dL (ref >40)
LDL Cholesterol: 44 mg/dL (ref 0–99)
Total CHOL/HDL Ratio: 2 {ratio}
Triglycerides: 65 mg/dL (ref <150)
VLDL: 13 mg/dL (ref 0–40)

## 2023-06-10 LAB — BRAIN NATRIURETIC PEPTIDE: B Natriuretic Peptide: 501.8 pg/mL — ABNORMAL HIGH (ref 0.0–100.0)

## 2023-06-10 MED ORDER — ATORVASTATIN CALCIUM 20 MG PO TABS: ORAL_TABLET | ORAL | 11 refills | Status: DC

## 2023-06-10 MED ORDER — DAPAGLIFLOZIN PROPANEDIOL 10 MG PO TABS: 10.0000 mg | ORAL_TABLET | Freq: Every day | ORAL | 11 refills | Status: AC

## 2023-06-10 MED ORDER — SPIRONOLACTONE 25 MG PO TABS: 25.0000 mg | ORAL_TABLET | Freq: Every day | ORAL | 6 refills | Status: DC

## 2023-06-10 NOTE — Patient Instructions (Signed)
Medication Changes:  Increase Spironolactone to 25 mg (1 tab) Daily  Lab Work:  Labs done today, your results will be available in MyChart, we will contact you for abnormal readings.  Your physician recommends that you return for lab work in: 1-2 weeks  Testing/Procedures:  none  Referrals:  none  Special Instructions // Education:  Do the following things EVERYDAY: Weigh yourself in the morning before breakfast. Write it down and keep it in a log. Take your medicines as prescribed Eat low salt foods--Limit salt (sodium) to 2000 mg per day.  Stay as active as you can everyday Limit all fluids for the day to less than 2 liters   Follow-Up in: 3 months   At the [facility], you and your health needs are our priority. We have a designated team specialized in the treatment of Heart Failure. This Care Team includes your primary Heart Failure Specialized Cardiologist (physician), Advanced Practice Providers (APPs- Physician Assistants and Nurse Practitioners), and Pharmacist who all work together to provide you with the care you need, when you need it.   You may see any of the following providers on your designated Care Team at your next follow up:  Dr. Arvilla Meres Dr. Marca Ancona Dr. Dorthula Nettles Dr. Theresia Bough Tonye Becket, NP Robbie Lis, [place] [facility] [place], [place] Brynda Peon, NP [place] Lee, NP Karle Plumber, PharmD   Please be sure to bring in all your medications bottles to every appointment.   Need to Contact us:  If you have any questions or concerns before your next appointment please send [place] a message through [place] or call our office at [phone].    TO LEAVE A MESSAGE FOR THE NURSE SELECT OPTION 2, PLEASE LEAVE A MESSAGE INCLUDING: YOUR NAME DATE OF BIRTH CALL BACK NUMBER REASON FOR CALL**this is important as we prioritize the call backs  YOU WILL RECEIVE A CALL BACK THE SAME DAY AS LONG AS YOU CALL BEFORE  4:00 PM

## 2023-06-10 NOTE — Progress Notes (Signed)
ID:  [name], DOB 04/18/1932, MRN [id number]  Provider location:  Advanced Heart Failure Type of Visit: Established patient   PCP:  Tisovec, Adelfa Koh, MD  Cardiologist:  Kristeen Miss, MD HF Cardiology: Dr. Shirlee Latch   History of Present Illness: Shannon [name] is a 89 y.o. female who has a history of paroxysmal atrial fibrillation, chronic systolic CHF, and CAD. She apparently had been in persistent atrial fibrillation since around 8/16.  It appears that she was tachycardic most of the time prior to her recent admission in 7/17.  She had been on amiodarone prior to admission but it was not keeping her in NSR.  She was admitted with acute systolic CHF.  EF had dropped to 20-25% in 7/17 from 50-55% in 10/16.  She was in atrial fibrillation with RVR.  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  Amiodarone was stopped due to suspicion for amiodarone-induced lung toxicity (she was seen by pulmonary).  She was eventually cardioverted to NSR.  Ranolazine was started to try to keep her in NSR.  HR was low in the hospital so she has not been on a beta blocker.  She was extensively diuresed and discharged to SNF.  After discharge, she developed persistent nausea.  Eventually, it was found that the nausea was likely from ranolazine, so this medication was stopped and the nausea resolved.     Patient continued to have difficult-to-control atrial fibrillation.  GFR was too low for Tikosyn.  She therefore had AV nodal ablation with St Jude CRT-P device placed in 3/18.  She developed a pocket hematoma and had pocket revision with sub-pectoral placement in 5/18.    Echo in 9/19 showed EF up to 60-65%, moderate MR, moderate TR, PASP 43 mmHg. Echo in 10/20 showed EF 55-60%, moderately decreased RV systolic function with moderate RV enlargement, moderate-severe biatrial enlargement, moderate-severe TR, mild MR.   Echo in 4/22 showed EF 55% with  mild LVH, mild RV enlargement with mildly decreased RV systolic function, severe biatrial enlargement, mild MR, moderate TR.  Echo in 5/23 showed EF 50-55%, moderate RV enlargement with normal systolic function, PASP 42 mmHg, moderate MR, severe TR.   Patient was admitted to the hospital in [facility] in 12/23 with CHF, predominantly RV failure.  Echo there showed EF 45-50%, lateral hypokinesis, severe RV dilation with mildly decreased RV systolic function, severe TR.   She returns today for followup of CHF.  She had cellulitis on a foot recently, has completed antibiotics.  She has a lot of pain in her feet, has diabetic neuropathy that was worsened by cellulitis.  Not short of breath walking around the house.  She tires walking longer distances.  No lightheadedness.  She uses a walker for balance.  No chest pain.  Weight is stable.   ECG (personally reviewed): Atrial fibrillation, BiV pacing, PVC  St Jude device interrogation: thoracic impedance currently at baseline, 99% BiV pacing.    Labs (6/19): K 4.4, creatinine 1.7, hgb 12.8, LDL 54 Labs (10/19): K 4.2, creatinine 1.46 Labs (6/21): K 4.6, creatinine 1.5, LDL 43, HDL 51 Labs (3/22): BNP 635, LFTs normal, K 3.8, creatinine 1.81 Labs (5/23): K 4.3, creatinine 1.63 => 1.67 Labs (9/23): K 4.0, creatinine 2.11 Labs (11/23): K 5.1, creatinine 1.74 Labs (1/24): BNP 704, K 3.3, creatinine 2.54 => 1.68 Labs (4/24): K 3.9, creatinine 1.66 Labs (8/24): K 4.5, creatinine 1.65 Labs (10/24): K 3.5, creatinine 1.56, BNP 399  PMH: 1. Atrial fibrillation: Paroxysmal.  Had been persistent since 8/16, was cardioverted to NSR during 7/17 admission.  Possible atypical atrial flutter on 12/17 ECG.  By 3/18, atrial fibrillation again.  - Nausea with ranolazine used for atrial fibrillation suppression.  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  - AV nodal ablation 3/18 with CRT-P.  2. 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  She had been in atrial fibrillation with elevated rate possibly for months prior to 7/17 admission.  - Echo (10/16): EF 50-55%.  - Echo (7/17): EF 20-25%, mildly decreased RV systolic function, severe TR, PA systolic pressure 64 mmHg.  - LHC/RHC (7/17): Patent OM stent, no obstructive CAD.  Mean RA 13, PA 45/25, mean PCWP 16, CI 2.36.  - Echo (1/18): EF 30-35% with diffuse hypokinesis, mildly decreased RV systolic function, moderate TR and MR, PASP 53 mmHg, severe LAE.  - St Jude CRT-P placed with AV nodal ablation in 3/18.  - Echo (9/19): EF 60-65%, moderate LVH, grade 3 diastolic dysfunction, mild MR, mild AI, moderate TR, PASP 43 mmHg.  - Echo (10/20): EF 55-60%, moderately decreased RV systolic function with moderate RV enlargement, moderate-severe biatrial enlargement, moderate-severe TR, mild MR.  - Echo (4/22): EF 55% with mild LVH, mild RV enlargement with mildly decreased RV systolic function, severe biatrial enlargement, mild MR, moderate TR.  - Echo (5/23): EF 50-55%, moderate RV enlargement with normal systolic function, PASP 42 mmHg, moderate MR, severe TR.  - Echo (12/23): EF 45-50%, lateral hypokinesis, severe RV dilation with mildly decreased RV systolic function, severe TR.  3. CAD: DES to OM in 2007.  LHC (7/17) with patent stent and no new obstructive disease.  4. Type II diabetes 5. Hyperlipidemia 6. TIA 7. Possible amiodarone lung toxicity: Chest CT (7/17) with bronchiectasis/fibrosis, patchy bilateral densities.  8. CKD stage 3  Past Surgical History:  Procedure Laterality Date   ATRIAL FLUTTER ABLATION N/A 10/09/2012   Procedure: ATRIAL FLUTTER ABLATION;  Surgeon: Hillis Range, MD;  Location: [facility] CATH LAB;  Service: Cardiovascular;  Laterality: N/A;   AV NODE ABLATION N/A 09/27/2016   Procedure: AV Node Ablation;  Surgeon: Hillis Range, MD;  Location: [facility] INVASIVE CV LAB;  Service: Cardiovascular;  Laterality: N/A;   BIV PACEMAKER INSERTION CRT-P N/A  09/27/2016   Procedure: BiV Pacemaker Insertion CRT-P;  Surgeon: Hillis Range, MD;  Location: [facility] INVASIVE CV LAB;  Service: Cardiovascular;  Laterality: N/A;   CARDIAC CATHETERIZATION N/A 01/06/2016   Procedure: Right/Left Heart Cath and Coronary Angiography;  Surgeon: Lyn Records, MD;  Location: [facility] INVASIVE CV LAB;  Service: Cardiovascular;  Laterality: N/A;   CARDIOVERSION N/A 01/11/2016   Procedure: CARDIOVERSION;  Surgeon: Laurey Morale, MD;  Location: [facility] ENDOSCOPY;  Service: Cardiovascular;  Laterality: N/A;   cataracts     COLONOSCOPY N/A 10/13/2015   Procedure: COLONOSCOPY;  Surgeon: Ruffin Frederick, MD;  Location: [facility] ENDOSCOPY;  Service: Gastroenterology;  Laterality: N/A;   CORONARY ANGIOPLASTY     Status post PTCA and stenting of the first obtuse marginal-05/13/2006. We placed a 3.0 x 20 mm Taxus stent. It was post dilated using a 3.25 mm noncompliant balloon up to 14 atmospheres   IMPLANT POCKET INCISION & DRAINAGE N/A 11/02/2016   Procedure: Implant Pocket Incision & Drainage;  Surgeon: Marinus Maw, MD;  Location: [facility] INVASIVE CV LAB;  Service: Cardiovascular;  Laterality: N/A;   PPM GENERATOR CHANGEOUT N/A 11/28/2021   Procedure: PPM GENERATOR CHANGEOUT;  Surgeon: Marinus Maw, MD;  Location: [facility] INVASIVE CV LAB;  Service: Cardiovascular;  Laterality: N/A;   TONSILLECTOMY     Current Outpatient Medications  Medication Sig Dispense Refill   ACCU-CHEK FASTCLIX LANCETS MISC USE TO CHECK BLOOD SUGAR TWICE DAILY  5   ACCU-CHEK SMARTVIEW test strip      ELIQUIS 2.5 MG TABS tablet TAKE 1 TABLET (2.5 MG TOTAL) BY MOUTH 2 (TWO) TIMES DAILY. RESUME 6/4 WITH THE EVENING DOSE 60 tablet 11   gabapentin (NEURONTIN) 100 MG capsule Take 100 mg by mouth daily as needed.     Homeopathic Products (ARNICARE ARTHRITIS) TBDP Take 1 tablet by mouth daily as needed (mouth pain).     loratadine (CLARITIN) 10 MG tablet Take 10 mg by mouth as needed for allergies.     metFORMIN (GLUCOPHAGE) 500 MG  tablet Take 500 mg by mouth daily.     nitroGLYCERIN (NITROSTAT) 0.4 MG SL tablet Take 1 tablet as needed. 75 tablet 1   OVER THE COUNTER MEDICATION Apply 1 application. topically daily as needed (arthritis). Artrosilium topical cream     OVER THE COUNTER MEDICATION Apply 1 application. topically daily as needed (leg pain). magnilife db foot cream     torsemide (DEMADEX) 20 MG tablet Take 20 mg by mouth daily.     atorvastatin (LIPITOR) 20 MG tablet TAKE 1 TABLET BY MOUTH EVERY DAY 30 tablet 11   dapagliflozin propanediol (FARXIGA) 10 MG TABS tablet Take 1 tablet (10 mg total) by mouth daily before breakfast. 30 tablet 11   spironolactone (ALDACTONE) 25 MG tablet Take 1 tablet (25 mg total) by mouth at bedtime. 30 tablet 6   No current facility-administered medications for this encounter.   Allergies:   Amiodarone, Ranexa [ranolazine], Other, Demerol, Erythromycin, Pecan nut (diagnostic), Penicillins, Percocet [oxycodone-acetaminophen], and Tomato   Social History:  The patient  reports that she quit smoking about 59 years ago. Her smoking use included cigarettes. She has never used smokeless tobacco. She reports that she does not drink alcohol and does not use drugs.   Family History:  The patient's family history includes Alzheimer's disease in her sister; Coronary artery disease in her sister; Heart attack in her brother, father, mother, and sister; Stroke in her mother and sister.   ROS:  Please see the history of present illness.   All other systems are personally reviewed and negative.   Recent Labs: 06/10/2023: B Natriuretic Peptide 501.8; BUN 33; Creatinine, Ser 1.76; Hemoglobin 11.8; Platelets 179; Potassium 3.8; Sodium 135  Personally reviewed   Wt Readings from Last 3 Encounters:  06/10/23 50.2 kg (110 lb 9.6 oz)  04/17/23 49.9 kg (110 lb)  04/12/23 51.5 kg (113 lb 9.6 oz)   Wt 50.2 kg (110 lb 9.6 oz)   BMI 20.23 kg/m  General: NAD Neck: No JVD, no thyromegaly or thyroid  nodule.  Lungs: Clear to auscultation bilaterally with normal respiratory effort. CV: Nondisplaced PMI.  Heart regular S1/S2, no S3/S4, 1/6 HSM LLSB.  Trace ankle edema.  No carotid bruit.  Normal pedal pulses.  Abdomen: Soft, nontender, no hepatosplenomegaly, no distention.  Skin: Intact without lesions or rashes.  Neurologic: Alert and oriented x 3.  Psych: Normal affect. Extremities: No clubbing or cyanosis.  HEENT: Normal.   Assessment/Plan 1. Chronic systolic CHF: EF 16-10% on 7/17 echo, 30-35% on 1/18 echo.  Nonischemic cardiomyopathy, possible role for tachycardia-mediated cardiomyopathy (some improvement in EF with rate control but not back to normal). ECHO 03/2017 EF 40-45%.  She had AV nodal ablation  due to difficult-to-control atrial fibrillation in 3/18 with St Jude CRT-P placement. Repeat echo in 9/19 showed EF up to 60-65%.  Echo in 10/20 with EF 55-60%, moderately decreased RV systolic function with moderate RV enlargement, moderate-severe biatrial enlargement, moderate-severe TR, mild MR.  Echo in 4/22 with EF 55% with mild LVH, mild RV enlargement with mildly decreased RV systolic function, severe biatrial enlargement, mild MR, moderate TR.  Echo (5/23) with EF 50-55%, moderate RV enlargement with normal systolic function, PASP 42 mmHg, moderate MR, severe TR.  Echo in 12/23 with EF 45-50%, lateral hypokinesis, severe RV dilation with mildly decreased RV systolic function, severe TR.  At this point, she appears to have predominantly RV failure with severe TR.  This is complicated by CKD stage 3b. Stable NYHA class III symptoms. She is not volume overloaded by exam or CorVue. - Increase spironolactone back to 25 mg at bedtime.  BMET/BNP today, BMET in 10 days. - Continue torsemide 20 mg daily.   - Continue dapagliflozin 10 mg daily.  - Unable to tolerate losartan at even low dose.  - Consider low dose bisoprolol in future.   - I will arrange for repeat echo.  - Wear compression  stockings during the day.  2. Atrial fibrillation/flutter:  Concern for prior tachy-mediated cardiomyopathy.  She had suspected amiodarone-induced lung toxicity so ranolazine was started.  She did not tolerate ranolazine due to nausea. Poor Tikosyn candidate with CKD.   She tolerated atrial fibrillation poorly and rate was difficult to control, so she had AV nodal ablation with St Jude CRT-P device.  She is in chronic AF.  - Continue apixaban 2.5 mg bid (age, creatinine). CBC today.  3. CAD: Patent OM stent on coronary angiography in 7/17.  No chest pain.  - Continue statin, check lipids. - No ASA with apixaban use.   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  Off amiodarone.  Follows with pulmonary.  5. CKD stage 3:   - Continue Farxiga 10 mg daily.  Followup APP in 3 months.   Signed, Marca Ancona, MD  06/10/2023  [facility] [address] [facility] [place] [address] [phone] (office) [phone] (fax)

## 2023-06-20 ENCOUNTER — Other Ambulatory Visit (HOSPITAL_COMMUNITY): Payer: PPO

## 2023-06-24 NOTE — Progress Notes (Signed)
Remote pacemaker transmission.   

## 2023-06-24 NOTE — Addendum Note (Signed)
Addended by: Elease Etienne A on: 06/24/2023 02:56 PM   Modules accepted: Orders

## 2023-06-28 ENCOUNTER — Other Ambulatory Visit (HOSPITAL_COMMUNITY): Payer: PPO

## 2023-07-01 ENCOUNTER — Ambulatory Visit: Payer: PPO | Attending: Internal Medicine

## 2023-07-01 ENCOUNTER — Other Ambulatory Visit (HOSPITAL_COMMUNITY): Payer: Self-pay | Admitting: Cardiology

## 2023-07-01 DIAGNOSIS — Z950 Presence of cardiac pacemaker: Secondary | ICD-10-CM | POA: Diagnosis not present

## 2023-07-01 DIAGNOSIS — I5022 Chronic systolic (congestive) heart failure: Secondary | ICD-10-CM | POA: Diagnosis not present

## 2023-07-04 ENCOUNTER — Encounter: Payer: Self-pay | Admitting: Family Medicine

## 2023-07-05 ENCOUNTER — Ambulatory Visit (HOSPITAL_COMMUNITY)
Admission: RE | Admit: 2023-07-05 | Discharge: 2023-07-05 | Disposition: A | Discharge status: Home / Self Care | Payer: PPO | Source: Ambulatory Visit | Attending: Cardiology | Admitting: Cardiology

## 2023-07-05 ENCOUNTER — Encounter (HOSPITAL_COMMUNITY): Payer: Self-pay | Admitting: Internal Medicine

## 2023-07-05 DIAGNOSIS — I5032 Chronic diastolic (congestive) heart failure: Secondary | ICD-10-CM | POA: Insufficient documentation

## 2023-07-05 LAB — BASIC METABOLIC PANEL
Anion gap: 14 (ref 5–15)
BUN: 51 mg/dL — ABNORMAL HIGH (ref 8–23)
CO2: 25 mmol/L (ref 22–32)
Calcium: 9.6 mg/dL (ref 8.9–10.3)
Chloride: 95 mmol/L — ABNORMAL LOW (ref 98–111)
Creatinine, Ser: 1.51 mg/dL — ABNORMAL HIGH (ref 0.44–1.00)
GFR, Estimated: 33 mL/min — ABNORMAL LOW (ref >60)
Glucose, Bld: 155 mg/dL — ABNORMAL HIGH (ref 70–99)
Potassium: 3 mmol/L — ABNORMAL LOW (ref 3.5–5.1)
Sodium: 134 mmol/L — ABNORMAL LOW (ref 135–145)

## 2023-07-09 ENCOUNTER — Telehealth (HOSPITAL_COMMUNITY): Payer: Self-pay | Admitting: Cardiology

## 2023-07-09 DIAGNOSIS — I5032 Chronic diastolic (congestive) heart failure: Secondary | ICD-10-CM

## 2023-07-09 MED ORDER — POTASSIUM CHLORIDE ER 10 MEQ PO TBCR: 40.0000 meq | EXTENDED_RELEASE_TABLET | Freq: Every day | ORAL | 3 refills | Status: DC

## 2023-07-09 NOTE — Telephone Encounter (Signed)
-----   Message from Marca Ancona sent at 07/07/2023  6:46 PM EST ----- Increase total daily KCl by 40 meq and repeat BMET in 1 week.

## 2023-07-09 NOTE — Telephone Encounter (Signed)
 Patient called.  Patient aware.

## 2023-07-17 ENCOUNTER — Ambulatory Visit (HOSPITAL_COMMUNITY)
Admission: RE | Admit: 2023-07-17 | Discharge: 2023-07-17 | Disposition: A | Discharge status: Home / Self Care | Payer: PPO | Source: Ambulatory Visit | Attending: Cardiology | Admitting: Cardiology

## 2023-07-17 DIAGNOSIS — I5032 Chronic diastolic (congestive) heart failure: Secondary | ICD-10-CM

## 2023-07-17 LAB — BASIC METABOLIC PANEL
Anion gap: 13 (ref 5–15)
BUN: 37 mg/dL — ABNORMAL HIGH (ref 8–23)
CO2: 24 mmol/L (ref 22–32)
Calcium: 9.5 mg/dL (ref 8.9–10.3)
Chloride: 97 mmol/L — ABNORMAL LOW (ref 98–111)
Creatinine, Ser: 1.72 mg/dL — ABNORMAL HIGH (ref 0.44–1.00)
GFR, Estimated: 28 mL/min — ABNORMAL LOW (ref >60)
Glucose, Bld: 256 mg/dL — ABNORMAL HIGH (ref 70–99)
Potassium: 4.6 mmol/L (ref 3.5–5.1)
Sodium: 134 mmol/L — ABNORMAL LOW (ref 135–145)

## 2023-07-22 ENCOUNTER — Encounter: Payer: Self-pay | Admitting: Podiatry

## 2023-07-22 ENCOUNTER — Ambulatory Visit: Payer: PPO | Admitting: Podiatry

## 2023-07-22 VITALS — Ht 62.0 in | Wt 110.0 lb

## 2023-07-22 DIAGNOSIS — B351 Tinea unguium: Secondary | ICD-10-CM

## 2023-07-22 DIAGNOSIS — M79674 Pain in right toe(s): Secondary | ICD-10-CM | POA: Diagnosis not present

## 2023-07-22 DIAGNOSIS — M79675 Pain in left toe(s): Secondary | ICD-10-CM | POA: Diagnosis not present

## 2023-07-22 NOTE — Progress Notes (Signed)
Subjective:  Patient ID: Shannon [name], female    DOB: 1931-11-01,  MRN: [id number]   Shannon [name] presents to clinic today for:  Chief Complaint  Patient presents with   [facility]    She is here for a nail trim, and she noticed a red place on her left lower leg and she has been using ABT ointmnet and she had a place on the bottom of the right big toe, 1"Her last A1C level was 6.7"   Patient notes nails are thick, discolored, elongated and painful in shoegear when trying to ambulate.  She notes that she had cellulitis in her legs recently but she was not hospitalized.  States that she is feeling better now.  Her son is with her today.  PCP is Tisovec, Adelfa Koh, MD.  Past Medical History:  Diagnosis Date   Arthritis    Baker's cyst of knee    CAD (coronary artery disease)    a. 05/2006 Cath/PCI: LAD 62m, D1 40 ost, LCX nl, OM1 95 (3.0x20 Taxus DES), RCA nl, EF 40% w/ lat AK;  04/2007 Ex MV: minimal lat ischemia in area of prior infarct->low risk-> med Rx.   Chronic atrial fibrillation (HCC)    a. on Coumadin   Chronic systolic (congestive) heart failure (HCC)    a. 04/2015: EF 50-55% b. echo 12/2015: EF 20-25% w/ diffuse HK, biatrial enlargement, mild AI and MR, severe TR     Diabetes mellitus    Hyperlipidemia    MVP (mitral valve prolapse)    a. 10/2003 Echo: nl LV fxn, mild MR/TR/AS   Myocardial infarction [facility])    Sleep apnea    Transient ischemic attack     Past Surgical History:  Procedure Laterality Date   ATRIAL FLUTTER ABLATION N/A 10/09/2012   Procedure: ATRIAL FLUTTER ABLATION;  Surgeon: Hillis Range, MD;  Location: [facility] CATH LAB;  Service: Cardiovascular;  Laterality: N/A;   AV NODE ABLATION N/A 09/27/2016   Procedure: AV Node Ablation;  Surgeon: Hillis Range, MD;  Location: [facility] INVASIVE CV LAB;  Service: Cardiovascular;  Laterality: N/A;   BIV PACEMAKER INSERTION CRT-P N/A 09/27/2016   Procedure: BiV Pacemaker Insertion CRT-P;  Surgeon: Hillis Range, MD;   Location: [facility] INVASIVE CV LAB;  Service: Cardiovascular;  Laterality: N/A;   CARDIAC CATHETERIZATION N/A 01/06/2016   Procedure: Right/Left Heart Cath and Coronary Angiography;  Surgeon: Lyn Records, MD;  Location: [facility] INVASIVE CV LAB;  Service: Cardiovascular;  Laterality: N/A;   CARDIOVERSION N/A 01/11/2016   Procedure: CARDIOVERSION;  Surgeon: Laurey Morale, MD;  Location: [facility] ENDOSCOPY;  Service: Cardiovascular;  Laterality: N/A;   cataracts     COLONOSCOPY N/A 10/13/2015   Procedure: COLONOSCOPY;  Surgeon: Ruffin Frederick, MD;  Location: [facility] ENDOSCOPY;  Service: Gastroenterology;  Laterality: N/A;   CORONARY ANGIOPLASTY     Status post PTCA and stenting of the first obtuse marginal-05/13/2006. We placed a 3.0 x 20 mm Taxus stent. It was post dilated using a 3.25 mm noncompliant balloon up to 14 atmospheres   IMPLANT POCKET INCISION & DRAINAGE N/A 11/02/2016   Procedure: Implant Pocket Incision & Drainage;  Surgeon: Marinus Maw, MD;  Location: [facility] INVASIVE CV LAB;  Service: Cardiovascular;  Laterality: N/A;   PPM GENERATOR CHANGEOUT N/A 11/28/2021   Procedure: PPM GENERATOR CHANGEOUT;  Surgeon: Marinus Maw, MD;  Location: [facility] INVASIVE CV LAB;  Service: Cardiovascular;  Laterality: N/A;   TONSILLECTOMY  Allergies  Allergen Reactions   Amiodarone Anaphylaxis   Ranexa [Ranolazine] Nausea And Vomiting   Other Other (See Comments)    Vitamin E - causes heart rate to increase   Demerol Other (See Comments)    Burning sensation all over body   Erythromycin Nausea And Vomiting   Pecan Nut (Diagnostic) Diarrhea   Penicillins Swelling   Percocet [Oxycodone-Acetaminophen] Swelling   Tomato Rash    In large amounts     Review of Systems: Negative except as noted in the HPI.  Objective:  Shannon [name] is a pleasant 89 y.o. female in NAD. AAO x 3.  Vascular Examination: Capillary refill time is 3-5 seconds to toes bilateral. Palpable pedal pulses b/l LE. Digital hair  present b/l.  Skin temperature gradient WNL b/l. No varicosities b/l. No cyanosis noted b/l.   Dermatological Examination: Pedal skin with normal turgor, texture and tone b/l. No open wounds. No interdigital macerations b/l. Toenails x9 are 3mm thick, discolored, dystrophic with subungual debris. There is pain with compression of the nail plates.  They are elongated x9  Assessment/Plan: 1. Pain due to onychomycosis of toenails of both feet    The mycotic toenails were sharply debrided x9 with sterile nail nippers and a power debriding burr to decrease bulk/thickness and length.    There was a very minimal callus noted on the plantar aspect of the right hallux which was shaved uneventfully.  No further treatment needed with this lesion.  Return in about 3 months (around 10/20/2023) for [facility].   Clerance Lav, DPM, FACFAS [facility]     [address] [place], [place] [address]                Office [phone]  Fax [phone]

## 2023-07-24 ENCOUNTER — Other Ambulatory Visit (HOSPITAL_COMMUNITY): Payer: Self-pay | Admitting: Cardiology

## 2023-08-05 ENCOUNTER — Ambulatory Visit: Payer: PPO | Attending: Internal Medicine

## 2023-08-05 DIAGNOSIS — I5032 Chronic diastolic (congestive) heart failure: Secondary | ICD-10-CM | POA: Diagnosis not present

## 2023-08-05 DIAGNOSIS — Z950 Presence of cardiac pacemaker: Secondary | ICD-10-CM | POA: Diagnosis not present

## 2023-08-30 ENCOUNTER — Ambulatory Visit: Payer: PPO

## 2023-08-30 NOTE — Progress Notes (Signed)
 ID:  Shannon [name], DOB 25-Nov-1931, MRN [id number]  Provider location: [facility] Type of Visit: Established patient   PCP:  Tisovec, Adelfa Koh, MD  Cardiologist:  Kristeen Miss, MD HF Cardiology: Dr. Shirlee Latch   History of Present Illness: Shannon [name] is a 89 y.o. female who has a history of paroxysmal atrial fibrillation, chronic systolic CHF, and CAD. She apparently had been in persistent atrial fibrillation since around 8/16.  It appears that she was tachycardic most of the time prior to her recent admission in 7/17.  She had been on amiodarone prior to admission but it was not keeping her in NSR.  She was admitted with acute systolic CHF.  EF had dropped to 20-25% in 7/17 from 50-55% in 10/16.  She was in atrial fibrillation with RVR.  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  Amiodarone was stopped due to suspicion for amiodarone-induced lung toxicity (she was seen by pulmonary).  She was eventually cardioverted to NSR.  Ranolazine was started to try to keep her in NSR.  HR was low in the hospital so she has not been on a beta blocker.  She was extensively diuresed and discharged to SNF.  After discharge, she developed persistent nausea.  Eventually, it was found that the nausea was likely from ranolazine, so this medication was stopped and the nausea resolved.     Patient continued to have difficult-to-control atrial fibrillation.  GFR was too low for Tikosyn.  She therefore had AV nodal ablation with St Jude CRT-P device placed in 3/18.  She developed a pocket hematoma and had pocket revision with sub-pectoral placement in 5/18.    Echo in 9/19 showed EF up to 60-65%, moderate MR, moderate TR, PASP 43 mmHg. Echo in 10/20 showed EF 55-60%, moderately decreased RV systolic function with moderate RV enlargement, moderate-severe biatrial enlargement, moderate-severe TR, mild MR.   Echo in 4/22 showed EF 55% with  mild LVH, mild RV enlargement with mildly decreased RV systolic function, severe biatrial enlargement, mild MR, moderate TR.  Echo in 5/23 showed EF 50-55%, moderate RV enlargement with normal systolic function, PASP 42 mmHg, moderate MR, severe TR.   Patient was admitted to the hospital in [facility] in 12/23 with CHF, predominantly RV failure.  Echo there showed EF 45-50%, lateral hypokinesis, severe RV dilation with mildly decreased RV systolic function, severe TR.   Today she returns for HF follow up with her son. Overall feeling fine. Walks around the house with walker. No overt bleeding, but has black stools. She attributes this to hemorrhoids. Continues to struggle with neuropathy in legs, L>R but no falls. Had an episode of CP last night and took 1 nitroglycerin with relief. Denies  palpitations, abnormal bleeding, dizziness, edema, or PND/Orthopnea. Appetite ok. No fever or chills. Weight at home 110 pounds. Taking all medications.   ECG (personally reviewed): atrial flutter, V paced, no acute changes  St Jude device interrogation: stable thoracic impedence,  99% BiV pacing.    Labs (4/24): K 3.9, creatinine 1.66 Labs (8/24): K 4.5, creatinine 1.65 Labs (10/24): K 3.5, creatinine 1.56, BNP 399 Labs (12/24): LDL 44 Labs (1/25): K 4.6, creatinine 1.72    PMH: 1. Atrial fibrillation: Paroxysmal.  Had been persistent since 8/16, was cardioverted to NSR during 7/17 admission.  Possible atypical atrial flutter on 12/17 ECG.  By 3/18, atrial fibrillation again.  - Nausea with ranolazine used for atrial fibrillation suppression.  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  - AV nodal ablation 3/18 with CRT-P.  2. 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  She had been in atrial fibrillation with elevated rate possibly for months prior to 7/17 admission.  - Echo (10/16): EF 50-55%.  - Echo (7/17): EF 20-25%, mildly decreased RV systolic function,  severe TR, PA systolic pressure 64 mmHg.  - LHC/RHC (7/17): Patent OM stent, no obstructive CAD.  Mean RA 13, PA 45/25, mean PCWP 16, CI 2.36.  - Echo (1/18): EF 30-35% with diffuse hypokinesis, mildly decreased RV systolic function, moderate TR and MR, PASP 53 mmHg, severe LAE.  - St Jude CRT-P placed with AV nodal ablation in 3/18.  - Echo (9/19): EF 60-65%, moderate LVH, grade 3 diastolic dysfunction, mild MR, mild AI, moderate TR, PASP 43 mmHg.  - Echo (10/20): EF 55-60%, moderately decreased RV systolic function with moderate RV enlargement, moderate-severe biatrial enlargement, moderate-severe TR, mild MR.  - Echo (4/22): EF 55% with mild LVH, mild RV enlargement with mildly decreased RV systolic function, severe biatrial enlargement, mild MR, moderate TR.  - Echo (5/23): EF 50-55%, moderate RV enlargement with normal systolic function, PASP 42 mmHg, moderate MR, severe TR.  - Echo (12/23): EF 45-50%, lateral hypokinesis, severe RV dilation with mildly decreased RV systolic function, severe TR.  3. CAD: DES to OM in 2007.  LHC (7/17) with patent stent and no new obstructive disease.  4. Type II diabetes 5. Hyperlipidemia 6. TIA 7. Possible amiodarone lung toxicity: Chest CT (7/17) with bronchiectasis/fibrosis, patchy bilateral densities.  8. CKD stage 3  Past Surgical History:  Procedure Laterality Date   ATRIAL FLUTTER ABLATION N/A 10/09/2012   Procedure: ATRIAL FLUTTER ABLATION;  Surgeon: Hillis Range, MD;  Location: [facility] CATH LAB;  Service: Cardiovascular;  Laterality: N/A;   AV NODE ABLATION N/A 09/27/2016   Procedure: AV Node Ablation;  Surgeon: Hillis Range, MD;  Location: [facility] INVASIVE CV LAB;  Service: Cardiovascular;  Laterality: N/A;   BIV PACEMAKER INSERTION CRT-P N/A 09/27/2016   Procedure: BiV Pacemaker Insertion CRT-P;  Surgeon: Hillis Range, MD;  Location: [facility] INVASIVE CV LAB;  Service: Cardiovascular;  Laterality: N/A;   CARDIAC CATHETERIZATION N/A 01/06/2016   Procedure:  Right/Left Heart Cath and Coronary Angiography;  Surgeon: Lyn Records, MD;  Location: [facility] INVASIVE CV LAB;  Service: Cardiovascular;  Laterality: N/A;   CARDIOVERSION N/A 01/11/2016   Procedure: CARDIOVERSION;  Surgeon: Laurey Morale, MD;  Location: [facility] ENDOSCOPY;  Service: Cardiovascular;  Laterality: N/A;   cataracts     COLONOSCOPY N/A 10/13/2015   Procedure: COLONOSCOPY;  Surgeon: Ruffin Frederick, MD;  Location: [facility] ENDOSCOPY;  Service: Gastroenterology;  Laterality: N/A;   CORONARY ANGIOPLASTY     Status post PTCA and stenting of the first obtuse marginal-05/13/2006. We placed a 3.0 x 20 mm Taxus stent. It was post dilated using a 3.25 mm noncompliant balloon up to 14 atmospheres   IMPLANT POCKET INCISION & DRAINAGE N/A 11/02/2016   Procedure: Implant Pocket Incision & Drainage;  Surgeon: Marinus Maw, MD;  Location: [facility] INVASIVE CV LAB;  Service: Cardiovascular;  Laterality: N/A;   PPM GENERATOR CHANGEOUT N/A 11/28/2021   Procedure: PPM GENERATOR CHANGEOUT;  Surgeon: Marinus Maw, MD;  Location: [facility] INVASIVE CV LAB;  Service: Cardiovascular;  Laterality: N/A;   TONSILLECTOMY     Current Outpatient Medications  Medication Sig Dispense Refill   ACCU-CHEK FASTCLIX LANCETS MISC USE TO CHECK BLOOD SUGAR TWICE DAILY  5   ACCU-CHEK  SMARTVIEW test strip      atorvastatin (LIPITOR) 20 MG tablet TAKE 1 TABLET BY MOUTH EVERY DAY 30 tablet 11   dapagliflozin propanediol (FARXIGA) 10 MG TABS tablet Take 1 tablet (10 mg total) by mouth daily before breakfast. 30 tablet 11   ELIQUIS 2.5 MG TABS tablet TAKE 1 TABLET (2.5 MG TOTAL) BY MOUTH 2 (TWO) TIMES DAILY. RESUME 6/4 WITH THE EVENING DOSE 60 tablet 11   gabapentin (NEURONTIN) 100 MG capsule Take 100 mg by mouth daily as needed.     Homeopathic Products (ARNICARE ARTHRITIS) TBDP Take 1 tablet by mouth daily as needed (mouth pain).     metFORMIN (GLUCOPHAGE) 500 MG tablet Take 500 mg by mouth daily.     nitroGLYCERIN (NITROSTAT) 0.4 MG SL  tablet TAKE 1 TABLET BY MOUTH AS NEEDED 25 tablet 2   OVER THE COUNTER MEDICATION Apply 1 application. topically daily as needed (arthritis). Artrosilium topical cream     OVER THE COUNTER MEDICATION Apply 1 application. topically daily as needed (leg pain). magnilife db foot cream     potassium chloride (KLOR-CON) 10 MEQ tablet Take 4 tablets (40 mEq total) by mouth daily. 120 tablet 3   spironolactone (ALDACTONE) 25 MG tablet Take 1 tablet (25 mg total) by mouth at bedtime. 30 tablet 6   torsemide (DEMADEX) 20 MG tablet Take 20 mg by mouth daily.     No current facility-administered medications for this encounter.   Allergies:   Amiodarone, Ranexa [ranolazine], Other, Demerol, Erythromycin, Pecan nut (diagnostic), Penicillins, Percocet [oxycodone-acetaminophen], and Tomato   Social History:  The patient  reports that she quit smoking about 60 years ago. Her smoking use included cigarettes. She has never used smokeless tobacco. She reports that she does not drink alcohol and does not use drugs.   Family History:  The patient's family history includes Alzheimer's disease in her sister; Coronary artery disease in her sister; Heart attack in her brother, father, mother, and sister; Stroke in her mother and sister.   ROS:  Please see the history of present illness.   All other systems are personally reviewed and negative.   Recent Labs: 06/10/2023: B Natriuretic Peptide 501.8; Hemoglobin 11.8; Platelets 179 07/17/2023: BUN 37; Creatinine, Ser 1.72; Potassium 4.6; Sodium 134  Personally reviewed   Wt Readings from Last 3 Encounters:  09/09/23 50 kg (110 lb 3.2 oz)  07/22/23 49.9 kg (110 lb)  06/10/23 50.2 kg (110 lb 9.6 oz)   BP 102/64   Pulse 60   Ht 5\' 2"  (1.575 m)   Wt 50 kg (110 lb 3.2 oz)   SpO2 97%   BMI 20.16 kg/m  Physical Exam General:  NAD. No resp difficulty, arrived in [facility], elderly HEENT: Normal Neck: Supple. No JVD. Cor: Irregular rate & rhythm. No rubs, gallops or  murmurs. Lungs: Clear, diminished in bases. Abdomen: Soft, nontender, nondistended.  Extremities: No cyanosis, clubbing, rash, edema, compression hose on. Neuro: Alert & oriented x 3, moves all 4 extremities w/o difficulty. Affect pleasant.  Assessment/Plan 1. Chronic systolic CHF: EF 95-62% on 7/17 echo, 30-35% on 1/18 echo.  Nonischemic cardiomyopathy, possible role for tachycardia-mediated cardiomyopathy (some improvement in EF with rate control but not back to normal). ECHO 03/2017 EF 40-45%.  She had AV nodal ablation due to difficult-to-control atrial fibrillation in 3/18 with St Jude CRT-P placement. Repeat echo in 9/19 showed EF up to 60-65%.  Echo in 10/20 with EF 55-60%, moderately decreased RV systolic function with moderate RV enlargement, moderate-severe  biatrial enlargement, moderate-severe TR, mild MR.  Echo in 4/22 with EF 55% with mild LVH, mild RV enlargement with mildly decreased RV systolic function, severe biatrial enlargement, mild MR, moderate TR.  Echo (5/23) with EF 50-55%, moderate RV enlargement with normal systolic function, PASP 42 mmHg, moderate MR, severe TR.  Echo in 12/23 with EF 45-50%, lateral hypokinesis, severe RV dilation with mildly decreased RV systolic function, severe TR.  At this point, she appears to have predominantly RV failure with severe TR.  This is complicated by CKD stage 3b. Stable NYHA class IIb symptoms. She is not volume overloaded by exam or CorVue. - Continue spironolactone 25 mg at bedtime.  BMET today. - Continue torsemide 20 mg daily + 40 KCL daily. - Continue dapagliflozin 10 mg daily. No GU symptoms. - Unable to tolerate losartan at even low dose.  - Consider low dose bisoprolol in future, will hold off today with soft BP. - I will arrange for repeat echo.  - Continue compression stockings during the day.  2. Atrial fibrillation/flutter:  Concern for prior tachy-mediated cardiomyopathy.  She had suspected amiodarone-induced lung toxicity so  ranolazine was started.  She did not tolerate ranolazine due to nausea. Poor Tikosyn candidate with CKD.   She tolerated atrial fibrillation poorly and rate was difficult to control, so she had AV nodal ablation with St Jude CRT-P device.  She is in chronic AF.  - Continue apixaban 2.5 mg bid (age, creatinine). CBC 06/10/23 reviewed and stable. 3. CAD: Patent OM stent on coronary angiography in 7/17.  Episode of CP last night, relieved by 1 tablet of SL nitroglycerin. ECG today without acute changes. - Continue statin, good lipids 12/24 - No ASA with apixaban use.   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  Off amiodarone.  Follows with pulmonary.  5. CKD stage 3:  Baseline SCr 1.5-1.8 - Continue Farxiga 10 mg daily.  Followup APP in 3 months.   Signed, Jacklynn Ganong, FNP  09/09/2023  [facility] [address] [facility] [place] [address] [phone] (office) [phone] (fax)

## 2023-09-06 ENCOUNTER — Ambulatory Visit (INDEPENDENT_AMBULATORY_CARE_PROVIDER_SITE_OTHER)

## 2023-09-06 DIAGNOSIS — I428 Other cardiomyopathies: Secondary | ICD-10-CM | POA: Diagnosis not present

## 2023-09-09 ENCOUNTER — Ambulatory Visit (HOSPITAL_COMMUNITY)
Admission: RE | Admit: 2023-09-09 | Discharge: 2023-09-09 | Disposition: A | Discharge status: Home / Self Care | Payer: PPO | Source: Ambulatory Visit | Attending: Family Medicine

## 2023-09-09 ENCOUNTER — Ambulatory Visit: Payer: PPO | Attending: Internal Medicine

## 2023-09-09 ENCOUNTER — Encounter (HOSPITAL_COMMUNITY): Payer: Self-pay

## 2023-09-09 VITALS — BP 102/64 | HR 60 | Ht 62.0 in | Wt 110.2 lb

## 2023-09-09 DIAGNOSIS — Z79899 Other long term (current) drug therapy: Secondary | ICD-10-CM | POA: Diagnosis not present

## 2023-09-09 DIAGNOSIS — Z7984 Long term (current) use of oral hypoglycemic drugs: Secondary | ICD-10-CM | POA: Insufficient documentation

## 2023-09-09 DIAGNOSIS — I2510 Atherosclerotic heart disease of native coronary artery without angina pectoris: Secondary | ICD-10-CM | POA: Insufficient documentation

## 2023-09-09 DIAGNOSIS — N1832 Chronic kidney disease, stage 3b: Secondary | ICD-10-CM | POA: Diagnosis not present

## 2023-09-09 DIAGNOSIS — E1140 Type 2 diabetes mellitus with diabetic neuropathy, unspecified: Secondary | ICD-10-CM | POA: Insufficient documentation

## 2023-09-09 DIAGNOSIS — E1122 Type 2 diabetes mellitus with diabetic chronic kidney disease: Secondary | ICD-10-CM | POA: Diagnosis not present

## 2023-09-09 DIAGNOSIS — Z7901 Long term (current) use of anticoagulants: Secondary | ICD-10-CM | POA: Insufficient documentation

## 2023-09-09 DIAGNOSIS — Z950 Presence of cardiac pacemaker: Secondary | ICD-10-CM | POA: Insufficient documentation

## 2023-09-09 DIAGNOSIS — I209 Angina pectoris, unspecified: Secondary | ICD-10-CM | POA: Diagnosis not present

## 2023-09-09 DIAGNOSIS — I110 Hypertensive heart disease with heart failure: Secondary | ICD-10-CM | POA: Diagnosis not present

## 2023-09-09 DIAGNOSIS — E1151 Type 2 diabetes mellitus with diabetic peripheral angiopathy without gangrene: Secondary | ICD-10-CM | POA: Diagnosis not present

## 2023-09-09 DIAGNOSIS — N183 Chronic kidney disease, stage 3 unspecified: Secondary | ICD-10-CM | POA: Diagnosis not present

## 2023-09-09 DIAGNOSIS — I130 Hypertensive heart and chronic kidney disease with heart failure and stage 1 through stage 4 chronic kidney disease, or unspecified chronic kidney disease: Secondary | ICD-10-CM | POA: Diagnosis not present

## 2023-09-09 DIAGNOSIS — I5022 Chronic systolic (congestive) heart failure: Secondary | ICD-10-CM

## 2023-09-09 DIAGNOSIS — R7989 Other specified abnormal findings of blood chemistry: Secondary | ICD-10-CM | POA: Diagnosis not present

## 2023-09-09 DIAGNOSIS — I4819 Other persistent atrial fibrillation: Secondary | ICD-10-CM | POA: Diagnosis not present

## 2023-09-09 DIAGNOSIS — I739 Peripheral vascular disease, unspecified: Secondary | ICD-10-CM | POA: Diagnosis not present

## 2023-09-09 DIAGNOSIS — I509 Heart failure, unspecified: Secondary | ICD-10-CM | POA: Diagnosis not present

## 2023-09-09 DIAGNOSIS — N184 Chronic kidney disease, stage 4 (severe): Secondary | ICD-10-CM | POA: Diagnosis not present

## 2023-09-09 DIAGNOSIS — I428 Other cardiomyopathies: Secondary | ICD-10-CM | POA: Insufficient documentation

## 2023-09-09 DIAGNOSIS — I482 Chronic atrial fibrillation, unspecified: Secondary | ICD-10-CM | POA: Diagnosis not present

## 2023-09-09 DIAGNOSIS — D638 Anemia in other chronic diseases classified elsewhere: Secondary | ICD-10-CM | POA: Diagnosis not present

## 2023-09-09 DIAGNOSIS — T148XXA Other injury of unspecified body region, initial encounter: Secondary | ICD-10-CM | POA: Diagnosis not present

## 2023-09-09 DIAGNOSIS — Z87891 Personal history of nicotine dependence: Secondary | ICD-10-CM | POA: Insufficient documentation

## 2023-09-09 DIAGNOSIS — I87319 Chronic venous hypertension (idiopathic) with ulcer of unspecified lower extremity: Secondary | ICD-10-CM | POA: Diagnosis not present

## 2023-09-09 LAB — BASIC METABOLIC PANEL
Anion gap: 10 (ref 5–15)
BUN: 33 mg/dL — ABNORMAL HIGH (ref 8–23)
CO2: 22 mmol/L (ref 22–32)
Calcium: 9.2 mg/dL (ref 8.9–10.3)
Chloride: 101 mmol/L (ref 98–111)
Creatinine, Ser: 1.64 mg/dL — ABNORMAL HIGH (ref 0.44–1.00)
GFR, Estimated: 30 mL/min — ABNORMAL LOW (ref >60)
Glucose, Bld: 116 mg/dL — ABNORMAL HIGH (ref 70–99)
Potassium: 4.5 mmol/L (ref 3.5–5.1)
Sodium: 133 mmol/L — ABNORMAL LOW (ref 135–145)

## 2023-09-09 NOTE — Patient Instructions (Signed)
 Medication Changes:  No Changes In Medications at this time.   Lab Work:  Labs done today, your results will be available in MyChart, we will contact you for abnormal readings.  Testing/Procedures:  ECHOCARDIOGRAM AS SCHEDULED   Follow-Up in: 4 MONTHS PLEASE CALL OUR OFFICE AROUND MAY 2025 TO GET SCHEDULED FOR YOUR APPOINTMENT. PHONE NUMBER IS [phone] OPTION 2   At the [facility], you and your health needs are our priority. We have a designated team specialized in the treatment of Heart Failure. This Care Team includes your primary Heart Failure Specialized Cardiologist (physician), Advanced Practice Providers (APPs- Physician Assistants and Nurse Practitioners), and Pharmacist who all work together to provide you with the care you need, when you need it.   You may see any of the following providers on your designated Care Team at your next follow up:  Dr. Arvilla Meres Dr. Marca Ancona Dr. Dorthula Nettles Dr. Theresia Bough Tonye Becket, NP Robbie Lis, [place] [facility] [place], [place] Brynda Peon, NP [place] Lee, NP Karle Plumber, PharmD   Please be sure to bring in all your medications bottles to every appointment.   Need to Contact us:  If you have any questions or concerns before your next appointment please send [place] a message through [place] or call our office at [phone].    TO LEAVE A MESSAGE FOR THE NURSE SELECT OPTION 2, PLEASE LEAVE A MESSAGE INCLUDING: YOUR NAME DATE OF BIRTH CALL BACK NUMBER REASON FOR CALL**this is important as we prioritize the call backs  YOU WILL RECEIVE A CALL BACK THE SAME DAY AS LONG AS YOU CALL BEFORE 4:00 PM

## 2023-09-10 NOTE — Addendum Note (Signed)
 Encounter addended by: Jacklynn Ganong, FNP on: 09/10/2023 8:24 AM  Actions taken: Clinical Note Signed

## 2023-09-11 ENCOUNTER — Telehealth: Payer: Self-pay

## 2023-09-11 LAB — CUP PACEART REMOTE DEVICE CHECK
Battery Remaining Longevity: 29 mo
Battery Remaining Percentage: 64 %
Battery Voltage: 2.96 V
Date Time Interrogation Session: 20250307161109
Implantable Lead Connection Status: 753985
Implantable Lead Connection Status: 753985
Implantable Lead Connection Status: 753985
Implantable Lead Implant Date: 20180329
Implantable Lead Implant Date: 20180329
Implantable Lead Implant Date: 20180329
Implantable Lead Location: 753859
Implantable Lead Location: 753860
Implantable Lead Location: 753860
Implantable Lead Model: 3830
Implantable Lead Model: 5076
Implantable Pulse Generator Implant Date: 20230530
Lead Channel Impedance Value: 290 Ohm
Lead Channel Impedance Value: 360 Ohm
Lead Channel Pacing Threshold Amplitude: 1 V
Lead Channel Pacing Threshold Amplitude: 2.5 V
Lead Channel Pacing Threshold Pulse Width: 0.5 ms
Lead Channel Pacing Threshold Pulse Width: 1 ms
Lead Channel Sensing Intrinsic Amplitude: 6 mV
Lead Channel Setting Pacing Amplitude: 2 V
Lead Channel Setting Pacing Amplitude: 3.5 V
Lead Channel Setting Pacing Pulse Width: 0.5 ms
Lead Channel Setting Pacing Pulse Width: 1 ms
Lead Channel Setting Sensing Sensitivity: 4 mV
Pulse Gen Model: 3222
Pulse Gen Serial Number: 8080981

## 2023-09-11 NOTE — Telephone Encounter (Signed)
 Remote ICM transmission received.  Attempted call to patient regarding ICM remote transmission and no answer.

## 2023-09-11 NOTE — Progress Notes (Signed)
 EPIC Encounter for ICM Monitoring  Patient Name: Shannon [name] is a 89 y.o. female Date: 09/11/2023 Primary Care Physican: Gaspar Garbe, MD Primary Cardiologist: Nah[place]/McLean Electrophysiologist: Rae Roam Pacing: 99%          10/08/2022 Weight: 107 lbs  10/15/2022 Weight: 106.4 lbs 11/19/2022 Weight: 101.5 lbs 03/08/2023 Weight: 103 lbs 06/10/2023 Office Weight: 110 lbs                                                          Attempted call to patient and unable to reach.   Transmission results reviewed.    CorVue thoracic impedance suggesting normal fluid levels within the last month.   Prescribed:  Torsemide 20 mg take 1 tablet(s) (20 mg total) by mouth daily. Spironolactone 25 mg take 1 tablet (25 mg total) by mouth daily Per patient 07/05/2023 she is not consistently taking Spironolactone as prescribed   Labs: 06/10/2023 Creatinine 1.76, BUN 33, Potassium 3.8, Sodium 135, GFR 27 04/17/2023 Creatinine 1.56, BUN 35, Potassium 3.5, Sodium 138, GFR 32 02/02/2023 Creatinine 1.65, BUN 36, Potassium 4.5, Sodium 133, GFR 30 01/08/2023 Creatinine 1.68, BUN 38, Potassium 4.1, Sodium 132, GFR 29 10/12/2022 Creatinine 1.66, BUN 50, Potassium 3.9, Sodium 132, GFR 29  09/28/2022 Creatinine 1.71, BUN 31, Potassium 4.3, Sodium 135, GFR 28  09/07/2022 Creatinine 1.54, BUN 27, Potassium 4.6, Sodium 136, GFR 32  08/28/2022 Creatinine 1.45, BUN 25, Potassium 3.7, Sodium 135, GFR 35 A complete set of results can be found in Results Review.   Recommendations:  Unable to reach.     Follow-up plan: ICM clinic phone appointment on 10/14/2023.   91 day device clinic remote transmission 12/06/2023.     EP/Cardiology Office Visits:  01/07/2024 with Dr Shirlee Latch.  Recall 09/07/2022 with Dr Elease Hashimoto (yearly).  Recall 04/06/2024 with Dr Ladona Ridgel.    Copy of ICM check sent to Dr. Ladona Ridgel.    3 month ICM trend: 09/09/2023.    12-14 Month ICM trend:     Karie Soda, RN 09/11/2023 4:32 PM

## 2023-09-12 ENCOUNTER — Encounter: Payer: Self-pay | Admitting: Internal Medicine

## 2023-09-25 ENCOUNTER — Encounter: Payer: Self-pay | Admitting: Cardiology

## 2023-10-03 ENCOUNTER — Ambulatory Visit (HOSPITAL_COMMUNITY)
Admission: RE | Admit: 2023-10-03 | Discharge: 2023-10-03 | Disposition: A | Discharge status: Home / Self Care | Source: Ambulatory Visit | Attending: Family Medicine | Admitting: Family Medicine

## 2023-10-03 DIAGNOSIS — Z950 Presence of cardiac pacemaker: Secondary | ICD-10-CM | POA: Diagnosis not present

## 2023-10-03 DIAGNOSIS — E785 Hyperlipidemia, unspecified: Secondary | ICD-10-CM | POA: Insufficient documentation

## 2023-10-03 DIAGNOSIS — I5022 Chronic systolic (congestive) heart failure: Secondary | ICD-10-CM

## 2023-10-03 DIAGNOSIS — I2510 Atherosclerotic heart disease of native coronary artery without angina pectoris: Secondary | ICD-10-CM | POA: Diagnosis not present

## 2023-10-03 DIAGNOSIS — I3139 Other pericardial effusion (noninflammatory): Secondary | ICD-10-CM | POA: Diagnosis not present

## 2023-10-03 DIAGNOSIS — Z8679 Personal history of other diseases of the circulatory system: Secondary | ICD-10-CM | POA: Diagnosis not present

## 2023-10-03 DIAGNOSIS — I083 Combined rheumatic disorders of mitral, aortic and tricuspid valves: Secondary | ICD-10-CM | POA: Insufficient documentation

## 2023-10-03 DIAGNOSIS — R0600 Dyspnea, unspecified: Secondary | ICD-10-CM | POA: Diagnosis not present

## 2023-10-03 DIAGNOSIS — R0602 Shortness of breath: Secondary | ICD-10-CM | POA: Insufficient documentation

## 2023-10-03 DIAGNOSIS — R609 Edema, unspecified: Secondary | ICD-10-CM | POA: Diagnosis not present

## 2023-10-03 DIAGNOSIS — I110 Hypertensive heart disease with heart failure: Secondary | ICD-10-CM | POA: Insufficient documentation

## 2023-10-03 DIAGNOSIS — I429 Cardiomyopathy, unspecified: Secondary | ICD-10-CM | POA: Diagnosis not present

## 2023-10-03 DIAGNOSIS — I4819 Other persistent atrial fibrillation: Secondary | ICD-10-CM

## 2023-10-03 DIAGNOSIS — I509 Heart failure, unspecified: Secondary | ICD-10-CM | POA: Insufficient documentation

## 2023-10-03 DIAGNOSIS — E119 Type 2 diabetes mellitus without complications: Secondary | ICD-10-CM | POA: Diagnosis not present

## 2023-10-03 LAB — ECHOCARDIOGRAM COMPLETE
AR max vel: 1.52 cm2
AV Area VTI: 1.54 cm2
AV Area mean vel: 1.43 cm2
AV Mean grad: 3 mmHg
AV Peak grad: 5.9 mmHg
Ao pk vel: 1.21 m/s
Area-P 1/2: 4.79 cm2
Calc EF: 60.3 %
MV VTI: 1.16 cm2
S' Lateral: 3.3 cm
Single Plane A2C EF: 65.5 %
Single Plane A4C EF: 61.7 %

## 2023-10-03 NOTE — Progress Notes (Signed)
  Echocardiogram 2D Echocardiogram has been performed.  Ocie Doyne RDCS 10/03/2023, 11:40 AM

## 2023-10-09 NOTE — Progress Notes (Signed)
 Remote pacemaker transmission.

## 2023-10-09 NOTE — Addendum Note (Signed)
 Addended by: Elease Etienne A on: 10/09/2023 12:10 PM   Modules accepted: Orders

## 2023-10-14 ENCOUNTER — Encounter

## 2023-10-16 NOTE — Progress Notes (Signed)
 No ICM remote transmission received for 10/14/2023 and next ICM transmission scheduled for 11/04/2023.

## 2023-10-21 ENCOUNTER — Ambulatory Visit: Payer: PPO | Admitting: Podiatry

## 2023-10-21 ENCOUNTER — Other Ambulatory Visit (HOSPITAL_COMMUNITY): Payer: Self-pay | Admitting: Cardiology

## 2023-10-22 ENCOUNTER — Telehealth (HOSPITAL_COMMUNITY): Payer: Self-pay | Admitting: Cardiology

## 2023-10-22 NOTE — Telephone Encounter (Signed)
 Patient called to report nausea and increased fatigue since starting KCL tabs, reports she thought symptoms were related to virus/flu. Symptoms have lasted long enough and would like to know if KCL is still needed   Please advise

## 2023-10-24 ENCOUNTER — Telehealth (HOSPITAL_COMMUNITY): Payer: Self-pay | Admitting: Cardiology

## 2023-10-24 NOTE — Telephone Encounter (Signed)
 Called to confirm/remind patient of their appointment at the [facility] on 10/24/23.   Appointment:   [x] Confirmed  [] Left mess   [] No answer/No voice mail  [] VM Full/unable to leave message  [] Phone not in service  Patient reminded to bring all medications and/or complete list.  Confirmed patient has transportation. Gave directions, instructed to utilize valet parking.

## 2023-10-25 ENCOUNTER — Encounter (HOSPITAL_COMMUNITY): Payer: Self-pay | Admitting: Cardiology

## 2023-10-25 ENCOUNTER — Ambulatory Visit (HOSPITAL_COMMUNITY)
Admission: RE | Admit: 2023-10-25 | Discharge: 2023-10-25 | Disposition: A | Discharge status: Home / Self Care | Source: Ambulatory Visit | Attending: Cardiology | Admitting: Cardiology

## 2023-10-25 VITALS — BP 108/58 | HR 61 | Ht 62.0 in | Wt 104.2 lb

## 2023-10-25 DIAGNOSIS — E1122 Type 2 diabetes mellitus with diabetic chronic kidney disease: Secondary | ICD-10-CM | POA: Insufficient documentation

## 2023-10-25 DIAGNOSIS — I5032 Chronic diastolic (congestive) heart failure: Secondary | ICD-10-CM

## 2023-10-25 DIAGNOSIS — I2510 Atherosclerotic heart disease of native coronary artery without angina pectoris: Secondary | ICD-10-CM | POA: Diagnosis not present

## 2023-10-25 DIAGNOSIS — R54 Age-related physical debility: Secondary | ICD-10-CM | POA: Insufficient documentation

## 2023-10-25 DIAGNOSIS — I4819 Other persistent atrial fibrillation: Secondary | ICD-10-CM | POA: Insufficient documentation

## 2023-10-25 DIAGNOSIS — Z7901 Long term (current) use of anticoagulants: Secondary | ICD-10-CM | POA: Diagnosis not present

## 2023-10-25 DIAGNOSIS — Z7984 Long term (current) use of oral hypoglycemic drugs: Secondary | ICD-10-CM | POA: Diagnosis not present

## 2023-10-25 DIAGNOSIS — N183 Chronic kidney disease, stage 3 unspecified: Secondary | ICD-10-CM | POA: Diagnosis not present

## 2023-10-25 DIAGNOSIS — I4892 Unspecified atrial flutter: Secondary | ICD-10-CM | POA: Insufficient documentation

## 2023-10-25 DIAGNOSIS — Z79899 Other long term (current) drug therapy: Secondary | ICD-10-CM | POA: Insufficient documentation

## 2023-10-25 DIAGNOSIS — R110 Nausea: Secondary | ICD-10-CM | POA: Insufficient documentation

## 2023-10-25 DIAGNOSIS — I5023 Acute on chronic systolic (congestive) heart failure: Secondary | ICD-10-CM | POA: Diagnosis not present

## 2023-10-25 DIAGNOSIS — I428 Other cardiomyopathies: Secondary | ICD-10-CM | POA: Insufficient documentation

## 2023-10-25 LAB — BASIC METABOLIC PANEL WITH GFR
Anion gap: 15 (ref 5–15)
BUN: 42 mg/dL — ABNORMAL HIGH (ref 8–23)
CO2: 21 mmol/L — ABNORMAL LOW (ref 22–32)
Calcium: 9.6 mg/dL (ref 8.9–10.3)
Chloride: 92 mmol/L — ABNORMAL LOW (ref 98–111)
Creatinine, Ser: 1.72 mg/dL — ABNORMAL HIGH (ref 0.44–1.00)
GFR, Estimated: 28 mL/min — ABNORMAL LOW (ref >60)
Glucose, Bld: 152 mg/dL — ABNORMAL HIGH (ref 70–99)
Potassium: 4.7 mmol/L (ref 3.5–5.1)
Sodium: 128 mmol/L — ABNORMAL LOW (ref 135–145)

## 2023-10-25 LAB — CBC
HCT: 36.4 % (ref 36.0–46.0)
Hemoglobin: 12.9 g/dL (ref 12.0–15.0)
MCH: 34.9 pg — ABNORMAL HIGH (ref 26.0–34.0)
MCHC: 35.4 g/dL (ref 30.0–36.0)
MCV: 98.4 fL (ref 80.0–100.0)
Platelets: 245 10*3/uL (ref 150–400)
RBC: 3.7 MIL/uL — ABNORMAL LOW (ref 3.87–5.11)
RDW: 15.3 % (ref 11.5–15.5)
WBC: 8.3 10*3/uL (ref 4.0–10.5)
nRBC: 0 % (ref 0.0–0.2)

## 2023-10-25 LAB — BRAIN NATRIURETIC PEPTIDE: B Natriuretic Peptide: 436.1 pg/mL — ABNORMAL HIGH (ref 0.0–100.0)

## 2023-10-25 LAB — TSH: TSH: 4.013 u[IU]/mL (ref 0.350–4.500)

## 2023-10-25 NOTE — Patient Instructions (Signed)
 START Glucerna Twice daily  Labs done today, your results will be available in MyChart, we will contact you for abnormal readings.   Your physician recommends that you schedule a follow-up appointment as scheduled.  If you have any questions or concerns before your next appointment please send us  a message through [place] or call our office at [phone].    TO LEAVE A MESSAGE FOR THE NURSE SELECT OPTION 2, PLEASE LEAVE A MESSAGE INCLUDING: YOUR NAME DATE OF BIRTH CALL BACK NUMBER REASON FOR CALL**this is important as we prioritize the call backs  YOU WILL RECEIVE A CALL BACK THE SAME DAY AS LONG AS YOU CALL BEFORE 4:00 PM  At the [facility], you and your health needs are our priority. As part of our continuing mission to provide you with exceptional heart care, we have created designated Provider Care Teams. These Care Teams include your primary Cardiologist (physician) and Advanced Practice Providers (APPs- Physician Assistants and Nurse Practitioners) who all work together to provide you with the care you need, when you need it.   You may see any of the following providers on your designated Care Team at your next follow up: Dr Jules Oar Dr Peder Bourdon Dr. Alwin Baars Dr. Arta Lark Amy Marijane Shoulders, NP Ruddy Corral, [place] [facility] [place], [place] Dennise Fitz, NP [place] Lee, NP Shawnee Dellen, NP Luster Salters, PharmD Bevely Brush, PharmD   Please be sure to bring in all your medications bottles to every appointment.    Thank you for choosing [facility]

## 2023-10-27 NOTE — Progress Notes (Signed)
 ID:  Shannon [name], DOB 1932-01-04, MRN [id number]  Provider location: [facility] Type of Visit: Established patient   PCP:  Tisovec, Kristina Pfeiffer, MD  Cardiologist:  Ahmad Alert, MD HF Cardiology: Dr. Mitzie Anda  Chief complaint: CHF   History of Present Illness: Shannon [name] is a 89 y.o. female who has a history of paroxysmal atrial fibrillation, chronic systolic CHF, and CAD. She apparently had been in persistent atrial fibrillation since around 8/16.  It appears that she was tachycardic most of the time prior to her recent admission in 7/17.  She had been on amiodarone  prior to admission but it was not keeping her in NSR.  She was admitted with acute systolic CHF.  EF had dropped to 20-25% in 7/17 from 50-55% in 10/16.  She was in atrial fibrillation with RVR.  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  Amiodarone  was stopped due to suspicion for amiodarone -induced lung toxicity (she was seen by pulmonary).  She was eventually cardioverted to NSR.  Ranolazine  was started to try to keep her in NSR.  HR was low in the hospital so she has not been on a beta blocker.  She was extensively diuresed and discharged to SNF.  After discharge, she developed persistent nausea.  Eventually, it was found that the nausea was likely from ranolazine , so this medication was stopped and the nausea resolved.     Patient continued to have difficult-to-control atrial fibrillation.  GFR was too low for Tikosyn.  She therefore had AV nodal ablation with St Jude CRT-P device placed in 3/18.  She developed a pocket hematoma and had pocket revision with sub-pectoral placement in 5/18.    Echo in 9/19 showed EF up to 60-65%, moderate MR, moderate TR, PASP 43 mmHg. Echo in 10/20 showed EF 55-60%, moderately decreased RV systolic function with moderate RV enlargement, moderate-severe biatrial enlargement, moderate-severe TR, mild MR.   Echo in  4/22 showed EF 55% with mild LVH, mild RV enlargement with mildly decreased RV systolic function, severe biatrial enlargement, mild MR, moderate TR.  Echo in 5/23 showed EF 50-55%, moderate RV enlargement with normal systolic function, PASP 42 mmHg, moderate MR, severe TR.   Patient was admitted to the hospital in [facility] in 12/23 with CHF, predominantly RV failure.  Echo there showed EF 45-50%, lateral hypokinesis, severe RV dilation with mildly decreased RV systolic function, severe TR.   Echo in 4/25 showed EF 45-50%, global hypokinesis, mild RV dysfunction with mildly enlarged RV, mild MR.   Today she returns for HF follow up with her son. Weight down 6 lbs.  She has a poor appetite and gets nausea at times.  She feels weak in general, this has been worse x 3 wks.  She noticed black stool earlier in the weak but now it appears normal.  She is short of breath walking across the house.  Uses a walker.  No chest pain.  No orthopnea/PND .  ECG (personally reviewed): Atrial fibrillation with v-pacing  St Jude device interrogation: stable thoracic impedence,  99% his bundle pacing.    Labs (4/24): K 3.9, creatinine 1.66 Labs (8/24): K 4.5, creatinine 1.65 Labs (10/24): K 3.5, creatinine 1.56, BNP 399 Labs (12/24): LDL 44 Labs (1/25): K 4.6, creatinine 1.72 Labs (3/25): K 4.5, creatinine 1.64    PMH: 1. Atrial fibrillation: Paroxysmal.  Had been persistent since 8/16, was cardioverted to NSR during 7/17 admission.  Possible atypical  atrial flutter on 12/17 ECG.  By 3/18, atrial fibrillation again.  - Nausea with ranolazine  used for atrial fibrillation suppression.  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  - AV nodal ablation 3/18 with CRT-P.  2. 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  She had been in atrial fibrillation with elevated rate possibly for months prior to 7/17 admission.  - Echo (10/16): EF 50-55%.  - Echo (7/17): EF 20-25%, mildly  decreased RV systolic function, severe TR, PA systolic pressure 64 mmHg.  - LHC/RHC (7/17): Patent OM stent, no obstructive CAD.  Mean RA 13, PA 45/25, mean PCWP 16, CI 2.36.  - Echo (1/18): EF 30-35% with diffuse hypokinesis, mildly decreased RV systolic function, moderate TR and MR, PASP 53 mmHg, severe LAE.  - St Jude CRT-P placed with AV nodal ablation in 3/18.  - Echo (9/19): EF 60-65%, moderate LVH, grade 3 diastolic dysfunction, mild MR, mild AI, moderate TR, PASP 43 mmHg.  - Echo (10/20): EF 55-60%, moderately decreased RV systolic function with moderate RV enlargement, moderate-severe biatrial enlargement, moderate-severe TR, mild MR.  - Echo (4/22): EF 55% with mild LVH, mild RV enlargement with mildly decreased RV systolic function, severe biatrial enlargement, mild MR, moderate TR.  - Echo (5/23): EF 50-55%, moderate RV enlargement with normal systolic function, PASP 42 mmHg, moderate MR, severe TR.  - Echo (12/23): EF 45-50%, lateral hypokinesis, severe RV dilation with mildly decreased RV systolic function, severe TR.  - Echo (4/25):  EF 45-50%, global hypokinesis, mild RV dysfunction with mildly enlarged RV, mild MR.  3. CAD: DES to OM in 2007.  LHC (7/17) with patent stent and no new obstructive disease.  4. Type II diabetes 5. Hyperlipidemia 6. TIA 7. Possible amiodarone  lung toxicity: Chest CT (7/17) with bronchiectasis/fibrosis, patchy bilateral densities.  8. CKD stage 3  Past Surgical History:  Procedure Laterality Date   ATRIAL FLUTTER ABLATION N/A 10/09/2012   Procedure: ATRIAL FLUTTER ABLATION;  Surgeon: Jolly Needle, MD;  Location[facility] CATH LAB;  Service: Cardiovascular;  Laterality: N/A;   AV NODE ABLATION N/A 09/27/2016   Procedure: AV Node Ablation;  Surgeon: Jolly Needle, MD;  Location: [facility] INVASIVE CV LAB;  Service: Cardiovascular;  Laterality: N/A;   BIV PACEMAKER INSERTION CRT-P N/A 09/27/2016   Procedure: BiV Pacemaker Insertion CRT-P;  Surgeon: Jolly Needle, MD;   Location: [facility] INVASIVE CV LAB;  Service: Cardiovascular;  Laterality: N/A;   CARDIAC CATHETERIZATION N/A 01/06/2016   Procedure: Right/Left Heart Cath and Coronary Angiography;  Surgeon: Arty Binning, MD;  Location: [facility] INVASIVE CV LAB;  Service: Cardiovascular;  Laterality: N/A;   CARDIOVERSION N/A 01/11/2016   Procedure: CARDIOVERSION;  Surgeon: Darlis Eisenmenger, MD;  Location: [facility] ENDOSCOPY;  Service: Cardiovascular;  Laterality: N/A;   cataracts     COLONOSCOPY N/A 10/13/2015   Procedure: COLONOSCOPY;  Surgeon: Danette Duos, MD;  Location: [facility] ENDOSCOPY;  Service: Gastroenterology;  Laterality: N/A;   CORONARY ANGIOPLASTY     Status post PTCA and stenting of the first obtuse marginal-05/13/2006. We placed a 3.0 x 20 mm Taxus stent. It was post dilated using a 3.25 mm noncompliant balloon up to 14 atmospheres   IMPLANT POCKET INCISION & DRAINAGE N/A 11/02/2016   Procedure: Implant Pocket Incision & Drainage;  Surgeon: Tammie Fall, MD;  Location: [facility] INVASIVE CV LAB;  Service: Cardiovascular;  Laterality: N/A;   PPM GENERATOR CHANGEOUT N/A 11/28/2021   Procedure: PPM GENERATOR CHANGEOUT;  Surgeon: Tammie Fall, MD;  Location: [facility] INVASIVE  CV LAB;  Service: Cardiovascular;  Laterality: N/A;   TONSILLECTOMY     Current Outpatient Medications  Medication Sig Dispense Refill   ACCU-CHEK FASTCLIX LANCETS MISC USE TO CHECK BLOOD SUGAR TWICE DAILY  5   ACCU-CHEK SMARTVIEW test strip      atorvastatin  (LIPITOR) 20 MG tablet TAKE 1 TABLET BY MOUTH EVERY DAY 30 tablet 11   dapagliflozin  propanediol (FARXIGA ) 10 MG TABS tablet Take 1 tablet (10 mg total) by mouth daily before breakfast. 30 tablet 11   ELIQUIS  2.5 MG TABS tablet TAKE 1 TABLET (2.5 MG TOTAL) BY MOUTH 2 (TWO) TIMES DAILY. RESUME 6/4 WITH THE EVENING DOSE 60 tablet 11   gabapentin (NEURONTIN) 100 MG capsule Take 100 mg by mouth daily as needed.     Homeopathic Products (ARNICARE ARTHRITIS) TBDP Take 1 tablet by mouth daily as needed  (mouth pain).     metFORMIN  (GLUCOPHAGE ) 500 MG tablet Take 500 mg by mouth daily.     nitroGLYCERIN  (NITROSTAT ) 0.4 MG SL tablet TAKE 1 TABLET BY MOUTH AS NEEDED 25 tablet 2   OVER THE COUNTER MEDICATION Apply 1 application. topically daily as needed (arthritis). Artrosilium topical cream     OVER THE COUNTER MEDICATION Apply 1 application. topically daily as needed (leg pain). magnilife db foot cream     potassium chloride  (KLOR-CON ) 10 MEQ tablet TAKE 4 TABLETS (40 MEQ TOTAL) BY MOUTH DAILY. 360 tablet 3   spironolactone  (ALDACTONE ) 25 MG tablet Take 1 tablet (25 mg total) by mouth at bedtime. 30 tablet 6   torsemide  (DEMADEX ) 20 MG tablet Take 20 mg by mouth daily.     No current facility-administered medications for this encounter.   Allergies:   Amiodarone , Ranexa  [ranolazine ], Other, Demerol, Erythromycin, Pecan nut (diagnostic), Penicillins, Percocet [oxycodone -acetaminophen ], and Tomato   Social History:  The patient  reports that she quit smoking about 60 years ago. Her smoking use included cigarettes. She has never used smokeless tobacco. She reports that she does not drink alcohol  and does not use drugs.   Family History:  The patient's family history includes Alzheimer's disease in her sister; Coronary artery disease in her sister; Heart attack in her brother, father, mother, and sister; Stroke in her mother and sister.   ROS:  Please see the history of present illness.   All other systems are personally reviewed and negative.   Recent Labs: 10/25/2023: B Natriuretic Peptide 436.1; BUN 42; Creatinine, Ser 1.72; Hemoglobin 12.9; Platelets 245; Potassium 4.7; Sodium 128; TSH 4.013  Personally reviewed   Wt Readings from Last 3 Encounters:  10/25/23 47.3 kg (104 lb 3.2 oz)  09/09/23 50 kg (110 lb 3.2 oz)  07/22/23 49.9 kg (110 lb)   BP (!) 108/58   Pulse 61   Ht 5\' 2"  (1.575 m)   Wt 47.3 kg (104 lb 3.2 oz)   HC 8" (20.3 cm)   SpO2 98%   BMI 19.06 kg/m  General: NAD,  thin/frail Neck: No JVD, no thyromegaly or thyroid  nodule.  Lungs: Clear to auscultation bilaterally with normal respiratory effort. CV: Nondisplaced PMI.  Heart regular S1/S2, no S3/S4, no murmur.  No peripheral edema.  No carotid bruit.  Normal pedal pulses.  Abdomen: Soft, nontender, no hepatosplenomegaly, no distention.  Skin: Intact without lesions or rashes.  Neurologic: Alert and oriented x 3.  Psych: Normal affect. Extremities: No clubbing or cyanosis.  HEENT: Normal.   Assessment/Plan 1. Chronic systolic CHF: EF 81-19% on 7/17 echo, 30-35% on 1/18 echo.  Nonischemic cardiomyopathy, possible role for tachycardia-mediated cardiomyopathy (some improvement in EF with rate control but not back to normal). ECHO 03/2017 EF 40-45%.  She had AV nodal ablation due to difficult-to-control atrial fibrillation in 3/18 with St Jude CRT-P placement. Repeat echo in 9/19 showed EF up to 60-65%.  Echo in 10/20 with EF 55-60%, moderately decreased RV systolic function with moderate RV enlargement, moderate-severe biatrial enlargement, moderate-severe TR, mild MR.  Echo in 4/22 with EF 55% with mild LVH, mild RV enlargement with mildly decreased RV systolic function, severe biatrial enlargement, mild MR, moderate TR.  Echo (5/23) with EF 50-55%, moderate RV enlargement with normal systolic function, PASP 42 mmHg, moderate MR, severe TR.  Echo in 12/23 with EF 45-50%, lateral hypokinesis, severe RV dilation with mildly decreased RV systolic function, severe TR. Echo in 4/25 showed  EF 45-50%, global hypokinesis, mild RV dysfunction with mildly enlarged RV, mild MR, mild-moderate TR.  NYHA class III symptoms with weight loss and nausea.  She does not appear volume overloaded by exam or Corvue.  She is very frail and seems deconditioned.   - Increase dietary protein. - Continue spironolactone  25 mg at bedtime.  BMET/BNP today. - Continue torsemide  20 mg daily + 40 KCL daily. - Continue dapagliflozin  10 mg daily.   - Unable to tolerate losartan  at even low dose.  - Continue compression stockings during the day.  2. Atrial fibrillation/flutter:  Concern for prior tachy-mediated cardiomyopathy.  She had suspected amiodarone -induced lung toxicity so ranolazine  was started.  She did not tolerate ranolazine  due to nausea. Poor Tikosyn candidate with CKD.   She tolerated atrial fibrillation poorly and rate was difficult to control, so she had AV nodal ablation with St Jude CRT-P device.  She is in chronic AF.  - Continue apixaban  2.5 mg bid (age, creatinine). CBC today.  3. CAD: Patent OM stent on coronary angiography in 7/17.  No chest pain.  - Continue statin, good lipids 12/24 - No ASA with apixaban  use.   4. 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  Off amiodarone .  Follows with pulmonary.  5. CKD stage 3:  Baseline SCr 1.5-1.8 - Continue Farxiga  10 mg daily.  Followup APP in 6 wks  I spent 31 minutes reviewing records, interviewing/examining patient, and managing orders.   Signed, Peder Bourdon, MD  10/27/2023  [facility] [address] [facility] [place] [address] [phone] (office) [phone] (fax)

## 2023-10-28 ENCOUNTER — Encounter (HOSPITAL_COMMUNITY): Payer: Self-pay

## 2023-10-31 NOTE — Telephone Encounter (Signed)
 Ok to close encounter Pt seen in office 4/25

## 2023-11-04 ENCOUNTER — Ambulatory Visit: Attending: Internal Medicine

## 2023-11-04 DIAGNOSIS — I5032 Chronic diastolic (congestive) heart failure: Secondary | ICD-10-CM | POA: Diagnosis not present

## 2023-11-04 DIAGNOSIS — Z950 Presence of cardiac pacemaker: Secondary | ICD-10-CM

## 2023-11-05 ENCOUNTER — Telehealth: Payer: Self-pay

## 2023-11-05 ENCOUNTER — Encounter: Payer: Self-pay | Admitting: Cardiology

## 2023-11-05 NOTE — Telephone Encounter (Signed)
 Attempted call to patient. Left message stating device monitor is showing as disconnected.  Left [employer] number for assistance.

## 2023-11-07 ENCOUNTER — Telehealth: Payer: Self-pay

## 2023-11-07 NOTE — Telephone Encounter (Signed)
 Remote ICM transmission received.  Attempted call to patient regarding ICM remote transmission and no answer.

## 2023-11-07 NOTE — Progress Notes (Signed)
 EPIC Encounter for ICM Monitoring  Patient Name: Shannon [name] is a 89 y.o. female Date: 11/07/2023 Primary Care Physican: Tisovec, Richard W, MD Primary Cardiologist: Nahser/McLean Electrophysiologist: Arvid Latino Pacing: 99%          10/08/2022 Weight: 107 lbs  10/15/2022 Weight: 106.4 lbs 11/19/2022 Weight: 101.5 lbs 03/08/2023 Weight: 103 lbs 06/10/2023 Office Weight: 110 lbs        10/25/2023 Office Weight: 104 lbs                                                   Attempted call to patient and unable to reach.   Transmission results reviewed.    CorVue thoracic impedance trending slightly below baseline but close to normal.   Prescribed:  Torsemide  20 mg take 1 tablet(s) (20 mg total) by mouth daily. Spironolactone  25 mg take 1 tablet (25 mg total) by mouth daily Per patient 07/05/2023 she is not consistently taking Spironolactone  as prescribed   Labs: 10/25/2023 Creatinine 1.72, BUN 42, Potassium 4.7, Sodium 128, GFR 28 (per HF clinic lab note, Dr Mitzie Anda advised to drink less water) A complete set of results can be found in Results Review.   Recommendations:  Unable to reach.     Follow-up plan: ICM clinic phone appointment on 12/09/2023.   91 day device clinic remote transmission 12/06/2023.     EP/Cardiology Office Visits:  12/06/2023 with Dr Mitzie Anda.  Recall 09/07/2022 with Dr Alroy Aspen (yearly).  Recall 04/06/2024 with Dr Carolynne Citron.    Copy of ICM check sent to Dr. Carolynne Citron.     3 month ICM trend: 11/06/2023.    12-14 Month ICM trend:     Almyra Jain, RN 11/07/2023 3:11 PM

## 2023-11-13 ENCOUNTER — Ambulatory Visit (HOSPITAL_COMMUNITY): Payer: Self-pay

## 2023-11-29 ENCOUNTER — Ambulatory Visit: Payer: PPO

## 2023-12-06 ENCOUNTER — Ambulatory Visit (INDEPENDENT_AMBULATORY_CARE_PROVIDER_SITE_OTHER)

## 2023-12-06 ENCOUNTER — Encounter (HOSPITAL_COMMUNITY)

## 2023-12-06 DIAGNOSIS — I130 Hypertensive heart and chronic kidney disease with heart failure and stage 1 through stage 4 chronic kidney disease, or unspecified chronic kidney disease: Secondary | ICD-10-CM | POA: Diagnosis not present

## 2023-12-06 DIAGNOSIS — D638 Anemia in other chronic diseases classified elsewhere: Secondary | ICD-10-CM | POA: Diagnosis not present

## 2023-12-06 DIAGNOSIS — I739 Peripheral vascular disease, unspecified: Secondary | ICD-10-CM | POA: Diagnosis not present

## 2023-12-06 DIAGNOSIS — E7800 Pure hypercholesterolemia, unspecified: Secondary | ICD-10-CM | POA: Diagnosis not present

## 2023-12-06 DIAGNOSIS — N184 Chronic kidney disease, stage 4 (severe): Secondary | ICD-10-CM | POA: Diagnosis not present

## 2023-12-06 DIAGNOSIS — R7989 Other specified abnormal findings of blood chemistry: Secondary | ICD-10-CM | POA: Diagnosis not present

## 2023-12-06 DIAGNOSIS — I420 Dilated cardiomyopathy: Secondary | ICD-10-CM

## 2023-12-06 DIAGNOSIS — E1140 Type 2 diabetes mellitus with diabetic neuropathy, unspecified: Secondary | ICD-10-CM | POA: Diagnosis not present

## 2023-12-06 DIAGNOSIS — I2510 Atherosclerotic heart disease of native coronary artery without angina pectoris: Secondary | ICD-10-CM | POA: Diagnosis not present

## 2023-12-06 DIAGNOSIS — I509 Heart failure, unspecified: Secondary | ICD-10-CM | POA: Diagnosis not present

## 2023-12-06 DIAGNOSIS — I209 Angina pectoris, unspecified: Secondary | ICD-10-CM | POA: Diagnosis not present

## 2023-12-06 DIAGNOSIS — I87319 Chronic venous hypertension (idiopathic) with ulcer of unspecified lower extremity: Secondary | ICD-10-CM | POA: Diagnosis not present

## 2023-12-06 DIAGNOSIS — E1151 Type 2 diabetes mellitus with diabetic peripheral angiopathy without gangrene: Secondary | ICD-10-CM | POA: Diagnosis not present

## 2023-12-06 DIAGNOSIS — I482 Chronic atrial fibrillation, unspecified: Secondary | ICD-10-CM | POA: Diagnosis not present

## 2023-12-06 NOTE — Progress Notes (Incomplete)
 ID:  Shannon [name], DOB [birth year], MRN [id number]  Provider location: [facility] Type of Visit: Established patient   PCP:  Tisovec, Kristina Pfeiffer, MD  Cardiologist:  Ahmad Alert, MD HF Cardiology: Dr. Mitzie Anda  Chief complaint: CHF   History of Present Illness: Shannon [name] is a 89 y.o. female who has a history of paroxysmal atrial fibrillation, chronic systolic CHF, and CAD. She apparently had been in persistent atrial fibrillation since around 8/16.  It appears that she was tachycardic most of the time prior to her recent admission in 7/17.  She had been on amiodarone  prior to admission but it was not keeping her in NSR.  She was admitted with acute systolic CHF.  EF had dropped to 20-25% in 7/17 from 50-55% in 10/16.  She was in atrial fibrillation with RVR.  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  Amiodarone  was stopped due to suspicion for amiodarone -induced lung toxicity (she was seen by pulmonary).  She was eventually cardioverted to NSR.  Ranolazine  was started to try to keep her in NSR.  HR was low in the hospital so she has not been on a beta blocker.  She was extensively diuresed and discharged to SNF.  After discharge, she developed persistent nausea.  Eventually, it was found that the nausea was likely from ranolazine , so this medication was stopped and the nausea resolved.     Patient continued to have difficult-to-control atrial fibrillation.  GFR was too low for Tikosyn.  She therefore had AV nodal ablation with St Jude CRT-P device placed in 3/18.  She developed a pocket hematoma and had pocket revision with sub-pectoral placement in 5/18.    Echo in 9/19 showed EF up to 60-65%, moderate MR, moderate TR, PASP 43 mmHg. Echo in 10/20 showed EF 55-60%, moderately decreased RV systolic function with moderate RV enlargement, moderate-severe biatrial enlargement, moderate-severe TR, mild MR.   Echo in  4/22 showed EF 55% with mild LVH, mild RV enlargement with mildly decreased RV systolic function, severe biatrial enlargement, mild MR, moderate TR.  Echo in 5/23 showed EF 50-55%, moderate RV enlargement with normal systolic function, PASP 42 mmHg, moderate MR, severe TR.   Patient was admitted to the hospital in [facility] in 12/23 with CHF, predominantly RV failure.  Echo there showed EF 45-50%, lateral hypokinesis, severe RV dilation with mildly decreased RV systolic function, severe TR.   Echo in 4/25 showed EF 45-50%, global hypokinesis, mild RV dysfunction with mildly enlarged RV, mild MR.   Today she returns for HF follow up with her son. Weight down 6 lbs.  She has a poor appetite and gets nausea at times.  She feels weak in general, this has been worse x 3 wks.  She noticed black stool earlier in the weak but now it appears normal.  She is short of breath walking across the house.  Uses a walker.  No chest pain.  No orthopnea/PND .  ECG (personally reviewed): Atrial fibrillation with v-pacing  St Jude device interrogation: stable thoracic impedence,  99% his bundle pacing.    Labs (4/24): K 3.9, creatinine 1.66 Labs (8/24): K 4.5, creatinine 1.65 Labs (10/24): K 3.5, creatinine 1.56, BNP 399 Labs (12/24): LDL 44 Labs (1/25): K 4.6, creatinine 1.72 Labs (3/25): K 4.5, creatinine 1.64    PMH: 1. Atrial fibrillation: Paroxysmal.  Had been persistent since 8/16, was cardioverted to NSR during 7/17 admission.  Possible atypical  atrial flutter on 12/17 ECG.  By 3/18, atrial fibrillation again.  - Nausea with ranolazine  used for atrial fibrillation suppression.  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  - AV nodal ablation 3/18 with CRT-P.  2. 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  She had been in atrial fibrillation with elevated rate possibly for months prior to 7/17 admission.  - Echo (10/16): EF 50-55%.  - Echo (7/17): EF 20-25%, mildly  decreased RV systolic function, severe TR, PA systolic pressure 64 mmHg.  - LHC/RHC (7/17): Patent OM stent, no obstructive CAD.  Mean RA 13, PA 45/25, mean PCWP 16, CI 2.36.  - Echo (1/18): EF 30-35% with diffuse hypokinesis, mildly decreased RV systolic function, moderate TR and MR, PASP 53 mmHg, severe LAE.  - St Jude CRT-P placed with AV nodal ablation in 3/18.  - Echo (9/19): EF 60-65%, moderate LVH, grade 3 diastolic dysfunction, mild MR, mild AI, moderate TR, PASP 43 mmHg.  - Echo (10/20): EF 55-60%, moderately decreased RV systolic function with moderate RV enlargement, moderate-severe biatrial enlargement, moderate-severe TR, mild MR.  - Echo (4/22): EF 55% with mild LVH, mild RV enlargement with mildly decreased RV systolic function, severe biatrial enlargement, mild MR, moderate TR.  - Echo (5/23): EF 50-55%, moderate RV enlargement with normal systolic function, PASP 42 mmHg, moderate MR, severe TR.  - Echo (12/23): EF 45-50%, lateral hypokinesis, severe RV dilation with mildly decreased RV systolic function, severe TR.  - Echo (4/25):  EF 45-50%, global hypokinesis, mild RV dysfunction with mildly enlarged RV, mild MR.  3. CAD: DES to OM in 2007.  LHC (7/17) with patent stent and no new obstructive disease.  4. Type II diabetes 5. Hyperlipidemia 6. TIA 7. Possible amiodarone  lung toxicity: Chest CT (7/17) with bronchiectasis/fibrosis, patchy bilateral densities.  8. CKD stage 3  Past Surgical History:  Procedure Laterality Date   ATRIAL FLUTTER ABLATION N/A 10/09/2012   Procedure: ATRIAL FLUTTER ABLATION;  Surgeon: Jolly Needle, MD;  Location: [facility] CATH LAB;  Service: Cardiovascular;  Laterality: N/A;   AV NODE ABLATION N/A 09/27/2016   Procedure: AV Node Ablation;  Surgeon: Jolly Needle, MD;  Location: [facility] INVASIVE CV LAB;  Service: Cardiovascular;  Laterality: N/A;   BIV PACEMAKER INSERTION CRT-P N/A 09/27/2016   Procedure: BiV Pacemaker Insertion CRT-P;  Surgeon: Jolly Needle, MD;   Location: [facility] INVASIVE CV LAB;  Service: Cardiovascular;  Laterality: N/A;   CARDIAC CATHETERIZATION N/A 01/06/2016   Procedure: Right/Left Heart Cath and Coronary Angiography;  Surgeon: Arty Binning, MD;  Location: [facility] INVASIVE CV LAB;  Service: Cardiovascular;  Laterality: N/A;   CARDIOVERSION N/A 01/11/2016   Procedure: CARDIOVERSION;  Surgeon: Darlis Eisenmenger, MD;  Location: [facility] ENDOSCOPY;  Service: Cardiovascular;  Laterality: N/A;   cataracts     COLONOSCOPY N/A 10/13/2015   Procedure: COLONOSCOPY;  Surgeon: Danette Duos, MD;  Location: [facility] ENDOSCOPY;  Service: Gastroenterology;  Laterality: N/A;   CORONARY ANGIOPLASTY     Status post PTCA and stenting of the first obtuse marginal-05/13/2006. We placed a 3.0 x 20 mm Taxus stent. It was post dilated using a 3.25 mm noncompliant balloon up to 14 atmospheres   IMPLANT POCKET INCISION & DRAINAGE N/A 11/02/2016   Procedure: Implant Pocket Incision & Drainage;  Surgeon: Tammie Fall, MD;  Location: [facility] INVASIVE CV LAB;  Service: Cardiovascular;  Laterality: N/A;   PPM GENERATOR CHANGEOUT N/A 11/28/2021   Procedure: PPM GENERATOR CHANGEOUT;  Surgeon: Tammie Fall, MD;  Location: [facility] INVASIVE  CV LAB;  Service: Cardiovascular;  Laterality: N/A;   TONSILLECTOMY     Current Outpatient Medications  Medication Sig Dispense Refill   ACCU-CHEK FASTCLIX LANCETS MISC USE TO CHECK BLOOD SUGAR TWICE DAILY  5   ACCU-CHEK SMARTVIEW test strip      atorvastatin  (LIPITOR) 20 MG tablet TAKE 1 TABLET BY MOUTH EVERY DAY 30 tablet 11   dapagliflozin  propanediol (FARXIGA ) 10 MG TABS tablet Take 1 tablet (10 mg total) by mouth daily before breakfast. 30 tablet 11   ELIQUIS  2.5 MG TABS tablet TAKE 1 TABLET (2.5 MG TOTAL) BY MOUTH 2 (TWO) TIMES DAILY. RESUME 6/4 WITH THE EVENING DOSE 60 tablet 11   gabapentin (NEURONTIN) 100 MG capsule Take 100 mg by mouth daily as needed.     Homeopathic Products (ARNICARE ARTHRITIS) TBDP Take 1 tablet by mouth daily as needed  (mouth pain).     metFORMIN  (GLUCOPHAGE ) 500 MG tablet Take 500 mg by mouth daily.     nitroGLYCERIN  (NITROSTAT ) 0.4 MG SL tablet TAKE 1 TABLET BY MOUTH AS NEEDED 25 tablet 2   OVER THE COUNTER MEDICATION Apply 1 application. topically daily as needed (arthritis). Artrosilium topical cream     OVER THE COUNTER MEDICATION Apply 1 application. topically daily as needed (leg pain). magnilife db foot cream     potassium chloride  (KLOR-CON ) 10 MEQ tablet TAKE 4 TABLETS (40 MEQ TOTAL) BY MOUTH DAILY. 360 tablet 3   spironolactone  (ALDACTONE ) 25 MG tablet Take 1 tablet (25 mg total) by mouth at bedtime. 30 tablet 6   torsemide  (DEMADEX ) 20 MG tablet Take 20 mg by mouth daily.     No current facility-administered medications for this visit.   Allergies:   Amiodarone , Ranexa  [ranolazine ], Other, Demerol, Erythromycin, Pecan nut (diagnostic), Penicillins, Percocet [oxycodone -acetaminophen ], and Tomato   Social History:  The patient  reports that she quit smoking about 60 years ago. Her smoking use included cigarettes. She has never used smokeless tobacco. She reports that she does not drink alcohol  and does not use drugs.   Family History:  The patient's family history includes Alzheimer's disease in her sister; Coronary artery disease in her sister; Heart attack in her brother, father, mother, and sister; Stroke in her mother and sister.   ROS:  Please see the history of present illness.   All other systems are personally reviewed and negative.   Recent Labs: 10/25/2023: B Natriuretic Peptide 436.1; BUN 42; Creatinine, Ser 1.72; Hemoglobin 12.9; Platelets 245; Potassium 4.7; Sodium 128; TSH 4.013  Personally reviewed   Wt Readings from Last 3 Encounters:  10/25/23 47.3 kg (104 lb 3.2 oz)  09/09/23 50 kg (110 lb 3.2 oz)  07/22/23 49.9 kg (110 lb)   There were no vitals taken for this visit. General: NAD, thin/frail Neck: No JVD, no thyromegaly or thyroid  nodule.  Lungs: Clear to auscultation  bilaterally with normal respiratory effort. CV: Nondisplaced PMI.  Heart regular S1/S2, no S3/S4, no murmur.  No peripheral edema.  No carotid bruit.  Normal pedal pulses.  Abdomen: Soft, nontender, no hepatosplenomegaly, no distention.  Skin: Intact without lesions or rashes.  Neurologic: Alert and oriented x 3.  Psych: Normal affect. Extremities: No clubbing or cyanosis.  HEENT: Normal.   Assessment/Plan 1. Chronic systolic CHF: EF 62-95% on 7/17 echo, 30-35% on 1/18 echo.  Nonischemic cardiomyopathy, possible role for tachycardia-mediated cardiomyopathy (some improvement in EF with rate control but not back to normal). ECHO 03/2017 EF 40-45%.  She had AV nodal ablation due to  difficult-to-control atrial fibrillation in 3/18 with St Jude CRT-P placement. Repeat echo in 9/19 showed EF up to 60-65%.  Echo in 10/20 with EF 55-60%, moderately decreased RV systolic function with moderate RV enlargement, moderate-severe biatrial enlargement, moderate-severe TR, mild MR.  Echo in 4/22 with EF 55% with mild LVH, mild RV enlargement with mildly decreased RV systolic function, severe biatrial enlargement, mild MR, moderate TR.  Echo (5/23) with EF 50-55%, moderate RV enlargement with normal systolic function, PASP 42 mmHg, moderate MR, severe TR.  Echo in 12/23 with EF 45-50%, lateral hypokinesis, severe RV dilation with mildly decreased RV systolic function, severe TR. Echo in 4/25 showed  EF 45-50%, global hypokinesis, mild RV dysfunction with mildly enlarged RV, mild MR, mild-moderate TR.  NYHA class III symptoms with weight loss and nausea.  She does not appear volume overloaded by exam or Corvue.  She is very frail and seems deconditioned.   - Increase dietary protein. - Continue spironolactone  25 mg at bedtime.  BMET/BNP today. - Continue torsemide  20 mg daily + 40 KCL daily. - Continue dapagliflozin  10 mg daily.  - Unable to tolerate losartan  at even low dose.  - Continue compression stockings during  the day.  2. Atrial fibrillation/flutter:  Concern for prior tachy-mediated cardiomyopathy.  She had suspected amiodarone -induced lung toxicity so ranolazine  was started.  She did not tolerate ranolazine  due to nausea. Poor Tikosyn candidate with CKD.   She tolerated atrial fibrillation poorly and rate was difficult to control, so she had AV nodal ablation with St Jude CRT-P device.  She is in chronic AF.  - Continue apixaban  2.5 mg bid (age, creatinine). CBC today.  3. CAD: Patent OM stent on coronary angiography in 7/17.  No chest pain.  - Continue statin, good lipids 12/24 - No ASA with apixaban  use.   4. 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  Off amiodarone .  Follows with pulmonary.  5. CKD stage 3:  Baseline SCr 1.5-1.8 - Continue Farxiga  10 mg daily.  Followup APP in 6 wks  I spent 31 minutes reviewing records, interviewing/examining patient, and managing orders.   Signed, Elmarie Hacking, FNP  12/06/2023  [facility] [address] [facility] [place] [address] [phone] (office) [phone] (fax)

## 2023-12-09 LAB — CUP PACEART REMOTE DEVICE CHECK
Battery Remaining Longevity: 27 mo
Battery Remaining Percentage: 58 %
Battery Voltage: 2.96 V
Date Time Interrogation Session: 20250606180015
Implantable Lead Connection Status: 753985
Implantable Lead Connection Status: 753985
Implantable Lead Connection Status: 753985
Implantable Lead Implant Date: 20180329
Implantable Lead Implant Date: 20180329
Implantable Lead Implant Date: 20180329
Implantable Lead Location: 753859
Implantable Lead Location: 753860
Implantable Lead Location: 753860
Implantable Lead Model: 3830
Implantable Lead Model: 5076
Implantable Pulse Generator Implant Date: 20230530
Lead Channel Impedance Value: 290 Ohm
Lead Channel Impedance Value: 360 Ohm
Lead Channel Pacing Threshold Amplitude: 1 V
Lead Channel Pacing Threshold Amplitude: 2.5 V
Lead Channel Pacing Threshold Pulse Width: 0.5 ms
Lead Channel Pacing Threshold Pulse Width: 1 ms
Lead Channel Sensing Intrinsic Amplitude: 4.6 mV
Lead Channel Setting Pacing Amplitude: 2 V
Lead Channel Setting Pacing Amplitude: 3.5 V
Lead Channel Setting Pacing Pulse Width: 0.5 ms
Lead Channel Setting Pacing Pulse Width: 1 ms
Lead Channel Setting Sensing Sensitivity: 4 mV
Pulse Gen Model: 3222
Pulse Gen Serial Number: 8080981

## 2023-12-13 ENCOUNTER — Ambulatory Visit: Payer: Self-pay | Admitting: Internal Medicine

## 2023-12-16 ENCOUNTER — Encounter

## 2023-12-18 NOTE — Progress Notes (Signed)
 No ICM remote transmission received for 12/16/2023 and next ICM transmission scheduled for 02/03/2024.

## 2024-01-03 ENCOUNTER — Other Ambulatory Visit: Payer: Self-pay | Admitting: Cardiology

## 2024-01-15 ENCOUNTER — Other Ambulatory Visit: Payer: Self-pay | Admitting: Cardiology

## 2024-01-21 NOTE — Progress Notes (Signed)
 Remote pacemaker transmission.

## 2024-01-21 NOTE — Addendum Note (Signed)
 Addended by: VICCI SELLER A on: 01/21/2024 10:39 AM   Modules accepted: Orders

## 2024-02-01 ENCOUNTER — Other Ambulatory Visit (HOSPITAL_COMMUNITY): Payer: Self-pay | Admitting: Cardiology

## 2024-02-03 ENCOUNTER — Ambulatory Visit: Attending: Internal Medicine

## 2024-02-03 DIAGNOSIS — Z950 Presence of cardiac pacemaker: Secondary | ICD-10-CM

## 2024-02-03 DIAGNOSIS — I5032 Chronic diastolic (congestive) heart failure: Secondary | ICD-10-CM

## 2024-02-05 ENCOUNTER — Telehealth: Payer: Self-pay

## 2024-02-05 NOTE — Telephone Encounter (Signed)
 Remote ICM transmission received.  Attempted call to patient regarding ICM remote transmission and no answer or voice mail option.

## 2024-02-05 NOTE — Progress Notes (Signed)
 EPIC Encounter for ICM Monitoring  Patient Name: Shannon [name] is a 89 y.o. female Date: 02/05/2024 Primary Care Physican: Tisovec, Richard W, MD Primary Cardiologist: Nahser/McLean Electrophysiologist: Waddell Pore Pacing: 99%          10/08/2022 Weight: 107 lbs  10/15/2022 Weight: 106.4 lbs 11/19/2022 Weight: 101.5 lbs 03/08/2023 Weight: 103 lbs 06/10/2023 Office Weight: 110 lbs        10/25/2023 Office Weight: 104 lbs                                                   Attempted call to patient and unable to reach.    Transmission results reviewed.    CorVue thoracic impedance suggesting normal fluid levels with the exception of possible fluid accumulation from 7/8-7/21.   Prescribed:  Torsemide  20 mg take 1 tablet(s) (20 mg total) by mouth every other day alternating with 40 mg every other day. Potassium 10 mEq take 4 tablet(s) (40 mEq total) by mouth daily Spironolactone  25 mg take 1 tablet (25 mg total) by mouth at bedtime   Labs: 10/25/2023 Creatinine 1.72, BUN 42, Potassium 4.7, Sodium 128, GFR 28 (per HF clinic lab note, Dr Rolan advised to drink less water) A complete set of results can be found in Results Review.   Recommendations:  Unable to reach.     Follow-up plan: ICM clinic phone appointment on 03/09/2024.   91 day device clinic remote transmission 03/06/2024.     EP/Cardiology Office Visits:  Recall 01/07/2024 with Dr Rolan.  Recall 09/07/2022 with Dr Alveta (yearly).  Recall 04/06/2024 with Dr Waddell.    Copy of ICM check sent to Dr. Waddell.     3 month ICM trend: 02/03/2024.    12-14 Month ICM trend:     Mitzie GORMAN Garner, RN 02/05/2024 1:04 PM

## 2024-02-07 DIAGNOSIS — R7989 Other specified abnormal findings of blood chemistry: Secondary | ICD-10-CM | POA: Diagnosis not present

## 2024-02-07 DIAGNOSIS — D638 Anemia in other chronic diseases classified elsewhere: Secondary | ICD-10-CM | POA: Diagnosis not present

## 2024-02-07 DIAGNOSIS — I130 Hypertensive heart and chronic kidney disease with heart failure and stage 1 through stage 4 chronic kidney disease, or unspecified chronic kidney disease: Secondary | ICD-10-CM | POA: Diagnosis not present

## 2024-02-07 DIAGNOSIS — E7800 Pure hypercholesterolemia, unspecified: Secondary | ICD-10-CM | POA: Diagnosis not present

## 2024-02-07 DIAGNOSIS — E559 Vitamin D deficiency, unspecified: Secondary | ICD-10-CM | POA: Diagnosis not present

## 2024-02-07 DIAGNOSIS — R946 Abnormal results of thyroid function studies: Secondary | ICD-10-CM | POA: Diagnosis not present

## 2024-02-07 DIAGNOSIS — E1151 Type 2 diabetes mellitus with diabetic peripheral angiopathy without gangrene: Secondary | ICD-10-CM | POA: Diagnosis not present

## 2024-02-07 DIAGNOSIS — N184 Chronic kidney disease, stage 4 (severe): Secondary | ICD-10-CM | POA: Diagnosis not present

## 2024-02-14 DIAGNOSIS — I1310 Hypertensive heart and chronic kidney disease without heart failure, with stage 1 through stage 4 chronic kidney disease, or unspecified chronic kidney disease: Secondary | ICD-10-CM | POA: Diagnosis not present

## 2024-02-14 DIAGNOSIS — I739 Peripheral vascular disease, unspecified: Secondary | ICD-10-CM | POA: Diagnosis not present

## 2024-02-14 DIAGNOSIS — Z0000 Encounter for general adult medical examination without abnormal findings: Secondary | ICD-10-CM | POA: Diagnosis not present

## 2024-02-14 DIAGNOSIS — I2510 Atherosclerotic heart disease of native coronary artery without angina pectoris: Secondary | ICD-10-CM | POA: Diagnosis not present

## 2024-02-14 DIAGNOSIS — R7989 Other specified abnormal findings of blood chemistry: Secondary | ICD-10-CM | POA: Diagnosis not present

## 2024-02-14 DIAGNOSIS — E1151 Type 2 diabetes mellitus with diabetic peripheral angiopathy without gangrene: Secondary | ICD-10-CM | POA: Diagnosis not present

## 2024-02-14 DIAGNOSIS — I209 Angina pectoris, unspecified: Secondary | ICD-10-CM | POA: Diagnosis not present

## 2024-02-14 DIAGNOSIS — D638 Anemia in other chronic diseases classified elsewhere: Secondary | ICD-10-CM | POA: Diagnosis not present

## 2024-02-14 DIAGNOSIS — I87319 Chronic venous hypertension (idiopathic) with ulcer of unspecified lower extremity: Secondary | ICD-10-CM | POA: Diagnosis not present

## 2024-02-14 DIAGNOSIS — Z7901 Long term (current) use of anticoagulants: Secondary | ICD-10-CM | POA: Diagnosis not present

## 2024-02-14 DIAGNOSIS — I509 Heart failure, unspecified: Secondary | ICD-10-CM | POA: Diagnosis not present

## 2024-02-14 DIAGNOSIS — Z1339 Encounter for screening examination for other mental health and behavioral disorders: Secondary | ICD-10-CM | POA: Diagnosis not present

## 2024-02-14 DIAGNOSIS — E1140 Type 2 diabetes mellitus with diabetic neuropathy, unspecified: Secondary | ICD-10-CM | POA: Diagnosis not present

## 2024-02-14 DIAGNOSIS — I482 Chronic atrial fibrillation, unspecified: Secondary | ICD-10-CM | POA: Diagnosis not present

## 2024-02-14 DIAGNOSIS — N184 Chronic kidney disease, stage 4 (severe): Secondary | ICD-10-CM | POA: Diagnosis not present

## 2024-02-14 DIAGNOSIS — Z1331 Encounter for screening for depression: Secondary | ICD-10-CM | POA: Diagnosis not present

## 2024-02-28 ENCOUNTER — Ambulatory Visit: Payer: PPO

## 2024-03-06 ENCOUNTER — Ambulatory Visit

## 2024-03-09 ENCOUNTER — Encounter

## 2024-03-11 NOTE — Progress Notes (Signed)
 No ICM remote transmission received for 03/09/2024 and next ICM transmission scheduled for 03/16/2024.

## 2024-03-16 ENCOUNTER — Encounter

## 2024-03-16 ENCOUNTER — Telehealth: Payer: Self-pay

## 2024-03-16 ENCOUNTER — Other Ambulatory Visit (HOSPITAL_COMMUNITY): Payer: Self-pay | Admitting: Cardiology

## 2024-03-16 NOTE — Telephone Encounter (Signed)
 Remote ICM transmission received.  Attempted call to patient regarding ICM remote transmission left explaining home monitor is disconnected.  Advised to check the monitor and send manual transmission.  Also left [employer] number for assistance.

## 2024-03-18 NOTE — Progress Notes (Signed)
 No ICM remote transmission received for 03/16/2024 and next ICM transmission scheduled for 04/13/2024.

## 2024-04-05 ENCOUNTER — Other Ambulatory Visit (HOSPITAL_COMMUNITY): Payer: Self-pay | Admitting: Cardiology

## 2024-04-13 ENCOUNTER — Encounter

## 2024-04-13 NOTE — Progress Notes (Signed)
 Unable to reach patient for monthly ICM remote follow up and not receiving monthly remote transmission.  Patient disenrolled due patient is not actively participating in [facility] clinic.  Device clinic 91 day remote monitoring will continue per protocol.   ?

## 2024-04-23 ENCOUNTER — Ambulatory Visit (HOSPITAL_COMMUNITY)

## 2024-04-23 DIAGNOSIS — R946 Abnormal results of thyroid function studies: Secondary | ICD-10-CM | POA: Diagnosis not present

## 2024-05-04 ENCOUNTER — Encounter: Payer: Self-pay | Admitting: Podiatry

## 2024-05-04 ENCOUNTER — Ambulatory Visit: Admitting: Podiatry

## 2024-05-04 DIAGNOSIS — M79674 Pain in right toe(s): Secondary | ICD-10-CM

## 2024-05-04 DIAGNOSIS — B351 Tinea unguium: Secondary | ICD-10-CM | POA: Diagnosis not present

## 2024-05-04 DIAGNOSIS — M79675 Pain in left toe(s): Secondary | ICD-10-CM

## 2024-05-04 NOTE — Progress Notes (Signed)
 Subjective:  Patient ID: Shannon [name], female    DOB: [birth year],  MRN: [id number]   Shannon [name] presents to clinic today for:  Chief Complaint  Patient presents with   Diabetes    [facility] NIDDM A1C 6.8. Toenail trim.    Patient notes nails are thick, discolored, elongated and painful in shoegear when trying to ambulate.    PCP is Tisovec, Charlie ORN, MD.  Past Medical History:  Diagnosis Date   Arthritis    Baker's cyst of knee    CAD (coronary artery disease)    a. 05/2006 Cath/PCI: LAD 41m, D1 40 ost, LCX nl, OM1 95 (3.0x20 Taxus DES), RCA nl, EF 40% w/ lat AK;  04/2007 Ex MV: minimal lat ischemia in area of prior infarct->low risk-> med Rx.   Chronic atrial fibrillation (HCC)    a. on Coumadin    Chronic systolic (congestive) heart failure (HCC)    a. 04/2015: EF 50-55% b. echo 12/2015: EF 20-25% w/ diffuse HK, biatrial enlargement, mild AI and MR, severe TR     Diabetes mellitus    Hyperlipidemia    MVP (mitral valve prolapse)    a. 10/2003 Echo: nl LV fxn, mild MR/TR/AS   Myocardial infarction [facility])    Sleep apnea    Transient ischemic attack     Past Surgical History:  Procedure Laterality Date   ATRIAL FLUTTER ABLATION N/A 10/09/2012   Procedure: ATRIAL FLUTTER ABLATION;  Surgeon: Lynwood Rakers, MD;  Location: [facility] CATH LAB;  Service: Cardiovascular;  Laterality: N/A;   AV NODE ABLATION N/A 09/27/2016   Procedure: AV Node Ablation;  Surgeon: Lynwood Rakers, MD;  Location: [facility] INVASIVE CV LAB;  Service: Cardiovascular;  Laterality: N/A;   BIV PACEMAKER INSERTION CRT-P N/A 09/27/2016   Procedure: BiV Pacemaker Insertion CRT-P;  Surgeon: Lynwood Rakers, MD;  Location: [facility] INVASIVE CV LAB;  Service: Cardiovascular;  Laterality: N/A;   CARDIAC CATHETERIZATION N/A 01/06/2016   Procedure: Right/Left Heart Cath and Coronary Angiography;  Surgeon: Victory ORN Sharps, MD;  Location: [facility] INVASIVE CV LAB;  Service: Cardiovascular;  Laterality: N/A;   CARDIOVERSION N/A 01/11/2016    Procedure: CARDIOVERSION;  Surgeon: Ezra GORMAN Shuck, MD;  Location: [facility] ENDOSCOPY;  Service: Cardiovascular;  Laterality: N/A;   cataracts     COLONOSCOPY N/A 10/13/2015   Procedure: COLONOSCOPY;  Surgeon: Elspeth Deward Naval, MD;  Location: [facility] ENDOSCOPY;  Service: Gastroenterology;  Laterality: N/A;   CORONARY ANGIOPLASTY     Status post PTCA and stenting of the first obtuse marginal-05/13/2006. We placed a 3.0 x 20 mm Taxus stent. It was post dilated using a 3.25 mm noncompliant balloon up to 14 atmospheres   IMPLANT POCKET INCISION & DRAINAGE N/A 11/02/2016   Procedure: Implant Pocket Incision & Drainage;  Surgeon: Waddell Danelle ORN, MD;  Location: [facility] INVASIVE CV LAB;  Service: Cardiovascular;  Laterality: N/A;   PPM GENERATOR CHANGEOUT N/A 11/28/2021   Procedure: PPM GENERATOR CHANGEOUT;  Surgeon: Waddell Danelle ORN, MD;  Location: [facility] INVASIVE CV LAB;  Service: Cardiovascular;  Laterality: N/A;   TONSILLECTOMY      Allergies  Allergen Reactions   Amiodarone  Anaphylaxis   Ranexa  [Ranolazine ] Nausea And Vomiting   Other Other (See Comments)    Vitamin E - causes heart rate to increase   Demerol Other (See Comments)    Burning sensation all over body   Erythromycin Nausea And Vomiting   Pecan Nut (Diagnostic) Diarrhea   Penicillins Swelling   Percocet [Oxycodone -Acetaminophen ]  Swelling   Tomato Rash    In large amounts     Review of Systems: Negative except as noted in the HPI.  Objective:  Shannon [name] is a pleasant 89 y.o. female in NAD. AAO x 3.  Vascular Examination: Capillary refill time is 3-5 seconds to toes bilateral. Palpable pedal pulses b/l LE. Digital hair present b/l.  Skin temperature gradient WNL b/l. No varicosities b/l. No cyanosis noted b/l.   Dermatological Examination: Pedal skin with normal turgor, texture and tone b/l. No open wounds. No interdigital macerations b/l. Toenails x9 are 3mm thick, discolored, dystrophic with subungual debris. There is pain  with compression of the nail plates.  They are elongated x9  Assessment/Plan: 1. Pain due to onychomycosis of toenails of both feet    The mycotic toenails were sharply debrided x9 with sterile nail nippers and a power debriding burr to decrease bulk/thickness and length.    Return in about 3 months (around 08/04/2024) for [facility].   Shannon [name], DPM, FACFAS [facility]     [address] [place], [place] [address]                Office [phone]  Fax [phone]

## 2024-05-06 ENCOUNTER — Telehealth (HOSPITAL_COMMUNITY): Payer: Self-pay

## 2024-05-06 NOTE — Telephone Encounter (Addendum)
 Called to confirm/remind patient of their appointment at the [facility] on 04/06/24 10:30.   Appointment:   [] Confirmed  [] Left mess   [] No answer/No voice mail  [x] VM Full/unable to leave message  [] Phone not in service  Patient reminded to bring all medications and/or complete list.  Confirmed patient has transportation. Gave directions, instructed to utilize valet parking.

## 2024-05-07 ENCOUNTER — Encounter (HOSPITAL_COMMUNITY): Payer: Self-pay

## 2024-05-07 ENCOUNTER — Ambulatory Visit (HOSPITAL_COMMUNITY)
Admission: RE | Admit: 2024-05-07 | Discharge: 2024-05-07 | Disposition: A | Discharge status: Home / Self Care | Source: Ambulatory Visit | Attending: Physician Assistant | Admitting: Physician Assistant

## 2024-05-07 VITALS — BP 114/73 | HR 62 | Ht 61.0 in | Wt 110.2 lb

## 2024-05-07 DIAGNOSIS — Z79899 Other long term (current) drug therapy: Secondary | ICD-10-CM | POA: Insufficient documentation

## 2024-05-07 DIAGNOSIS — N1832 Chronic kidney disease, stage 3b: Secondary | ICD-10-CM

## 2024-05-07 DIAGNOSIS — N184 Chronic kidney disease, stage 4 (severe): Secondary | ICD-10-CM | POA: Diagnosis not present

## 2024-05-07 DIAGNOSIS — I5022 Chronic systolic (congestive) heart failure: Secondary | ICD-10-CM | POA: Diagnosis not present

## 2024-05-07 DIAGNOSIS — I2510 Atherosclerotic heart disease of native coronary artery without angina pectoris: Secondary | ICD-10-CM | POA: Diagnosis not present

## 2024-05-07 DIAGNOSIS — I480 Paroxysmal atrial fibrillation: Secondary | ICD-10-CM | POA: Insufficient documentation

## 2024-05-07 DIAGNOSIS — E1122 Type 2 diabetes mellitus with diabetic chronic kidney disease: Secondary | ICD-10-CM | POA: Insufficient documentation

## 2024-05-07 DIAGNOSIS — Z7901 Long term (current) use of anticoagulants: Secondary | ICD-10-CM | POA: Insufficient documentation

## 2024-05-07 DIAGNOSIS — Z7984 Long term (current) use of oral hypoglycemic drugs: Secondary | ICD-10-CM | POA: Diagnosis not present

## 2024-05-07 DIAGNOSIS — I4821 Permanent atrial fibrillation: Secondary | ICD-10-CM

## 2024-05-07 DIAGNOSIS — I428 Other cardiomyopathies: Secondary | ICD-10-CM | POA: Insufficient documentation

## 2024-05-07 LAB — CBC
HCT: 36.4 % (ref 36.0–46.0)
Hemoglobin: 12.3 g/dL (ref 12.0–15.0)
MCH: 35.4 pg — ABNORMAL HIGH (ref 26.0–34.0)
MCHC: 33.8 g/dL (ref 30.0–36.0)
MCV: 104.9 fL — ABNORMAL HIGH (ref 80.0–100.0)
Platelets: 183 K/uL (ref 150–400)
RBC: 3.47 MIL/uL — ABNORMAL LOW (ref 3.87–5.11)
RDW: 16.1 % — ABNORMAL HIGH (ref 11.5–15.5)
WBC: 6.5 K/uL (ref 4.0–10.5)
nRBC: 0 % (ref 0.0–0.2)

## 2024-05-07 LAB — BASIC METABOLIC PANEL WITH GFR
Anion gap: 11 (ref 5–15)
BUN: 33 mg/dL — ABNORMAL HIGH (ref 8–23)
CO2: 22 mmol/L (ref 22–32)
Calcium: 9.1 mg/dL (ref 8.9–10.3)
Chloride: 102 mmol/L (ref 98–111)
Creatinine, Ser: 1.58 mg/dL — ABNORMAL HIGH (ref 0.44–1.00)
GFR, Estimated: 31 mL/min — ABNORMAL LOW (ref >60)
Glucose, Bld: 121 mg/dL — ABNORMAL HIGH (ref 70–99)
Potassium: 4.7 mmol/L (ref 3.5–5.1)
Sodium: 135 mmol/L (ref 135–145)

## 2024-05-07 LAB — BRAIN NATRIURETIC PEPTIDE: B Natriuretic Peptide: 443.9 pg/mL — ABNORMAL HIGH (ref 0.0–100.0)

## 2024-05-07 NOTE — Progress Notes (Addendum)
 ID:  Shannon [name], DOB December 09, 1931, MRN [id number]  Provider location: [facility] Type of Visit: Established patient   PCP:  Tisovec, Charlie ORN, MD  Cardiologist:  Aleene Passe, MD (Inactive) HF Cardiology: Dr. Rolan  Chief complaint: CHF   History of Present Illness: Shannon [name] is a 89 y.o. female who has a history of paroxysmal atrial fibrillation, chronic systolic CHF, and CAD. She apparently had been in persistent atrial fibrillation since around 8/16.  It appears that she was tachycardic most of the time prior to her recent admission in 7/17.  She had been on amiodarone  prior to admission but it was not keeping her in NSR.  She was admitted with acute systolic CHF.  EF had dropped to 20-25% in 7/17 from 50-55% in 10/16.  She was in atrial fibrillation with RVR.  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  Amiodarone  was stopped due to suspicion for amiodarone -induced lung toxicity (she was seen by pulmonary).  She was eventually cardioverted to NSR.  Ranolazine  was started to try to keep her in NSR.  HR was low in the hospital so she has not been on a beta blocker.  She was extensively diuresed and discharged to SNF.  After discharge, she developed persistent nausea.  Eventually, it was found that the nausea was likely from ranolazine , so this medication was stopped and the nausea resolved.     Patient continued to have difficult-to-control atrial fibrillation.  GFR was too low for Tikosyn.  She therefore had AV nodal ablation with St Jude CRT-P device placed in 3/18.  She developed a pocket hematoma and had pocket revision with sub-pectoral placement in 5/18.    Echo in 9/19 showed EF up to 60-65%, moderate MR, moderate TR, PASP 43 mmHg. Echo in 10/20 showed EF 55-60%, moderately decreased RV systolic function with moderate RV enlargement, moderate-severe biatrial enlargement, moderate-severe TR, mild MR.    Echo in 4/22 showed EF 55% with mild LVH, mild RV enlargement with mildly decreased RV systolic function, severe biatrial enlargement, mild MR, moderate TR.  Echo in 5/23 showed EF 50-55%, moderate RV enlargement with normal systolic function, PASP 42 mmHg, moderate MR, severe TR.   Patient was admitted to the hospital in [facility] in 12/23 with CHF, predominantly RV failure.  Echo there showed EF 45-50%, lateral hypokinesis, severe RV dilation with mildly decreased RV systolic function, severe TR.   Echo in 4/25 showed EF 45-50%, global hypokinesis, mild RV dysfunction with mildly enlarged RV, mild MR, mild to moderate TR.   Here today for HF follow-up with her son.   Today she returns for HF follow up with her son. Weight down 6 lbs.  She has a poor appetite and gets nausea at times.  She feels weak in general, this has been worse x 3 wks.  She noticed black stool earlier in the weak but now it appears normal.  She is short of breath walking across the house.  Uses a walker.  No chest pain.  No orthopnea/PND .  Here today for CHF follow-up. She is accompanied by her son who assists with the history. Her other son recently moved in with her. She is able to ambulate around the house with a walker or holds on to furniture for support. She gets short of breath if she walks too fast. No orthopnea, PND or lower extremity edema. Weight up 6 lb from last visit. Appetite has  improved and she is trying to eat more protein. Missed a couple of doses of torsemide  this past week d/t medical appointments. No bleeding issues. Clemens once in August. Only fall this past year. No head trauma.  ECG (personally reviewed): N/A  St Jude device interrogation: CorVue impedance below threshold for about 1.5 weeks, 99% BiV paced, no VT   Labs (4/24): K 3.9, creatinine 1.66 Labs (8/24): K 4.5, creatinine 1.65 Labs (10/24): K 3.5, creatinine 1.56, BNP 399 Labs (12/24): LDL 44 Labs (1/25): K 4.6, creatinine 1.72 Labs  (3/25): K 4.5, creatinine 1.64 Labs (4/25): creatinine 1.72, K 4.7, BNP 436, TSH  4.013    PMH: 1. Atrial fibrillation: Paroxysmal.  Had been persistent since 8/16, was cardioverted to NSR during 7/17 admission.  Possible atypical atrial flutter on 12/17 ECG.  By 3/18, atrial fibrillation again.  - Nausea with ranolazine  used for atrial fibrillation suppression.  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  - AV nodal ablation 3/18 with CRT-P.  2. 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  She had been in atrial fibrillation with elevated rate possibly for months prior to 7/17 admission.  - Echo (10/16): EF 50-55%.  - Echo (7/17): EF 20-25%, mildly decreased RV systolic function, severe TR, PA systolic pressure 64 mmHg.  - LHC/RHC (7/17): Patent OM stent, no obstructive CAD.  Mean RA 13, PA 45/25, mean PCWP 16, CI 2.36.  - Echo (1/18): EF 30-35% with diffuse hypokinesis, mildly decreased RV systolic function, moderate TR and MR, PASP 53 mmHg, severe LAE.  - St Jude CRT-P placed with AV nodal ablation in 3/18.  - Echo (9/19): EF 60-65%, moderate LVH, grade 3 diastolic dysfunction, mild MR, mild AI, moderate TR, PASP 43 mmHg.  - Echo (10/20): EF 55-60%, moderately decreased RV systolic function with moderate RV enlargement, moderate-severe biatrial enlargement, moderate-severe TR, mild MR.  - Echo (4/22): EF 55% with mild LVH, mild RV enlargement with mildly decreased RV systolic function, severe biatrial enlargement, mild MR, moderate TR.  - Echo (5/23): EF 50-55%, moderate RV enlargement with normal systolic function, PASP 42 mmHg, moderate MR, severe TR.  - Echo (12/23): EF 45-50%, lateral hypokinesis, severe RV dilation with mildly decreased RV systolic function, severe TR.  - Echo (4/25):  EF 45-50%, global hypokinesis, mild RV dysfunction with mildly enlarged RV, mild MR.  3. CAD: DES to OM in 2007.  LHC (7/17) with patent stent and no new obstructive  disease.  4. Type II diabetes 5. Hyperlipidemia 6. TIA 7. Possible amiodarone  lung toxicity: Chest CT (7/17) with bronchiectasis/fibrosis, patchy bilateral densities.  8. CKD stage 3  Past Surgical History:  Procedure Laterality Date   ATRIAL FLUTTER ABLATION N/A 10/09/2012   Procedure: ATRIAL FLUTTER ABLATION;  Surgeon: Lynwood Rakers, MD;  Location: [facility] CATH LAB;  Service: Cardiovascular;  Laterality: N/A;   AV NODE ABLATION N/A 09/27/2016   Procedure: AV Node Ablation;  Surgeon: Lynwood Rakers, MD;  Location: [facility] INVASIVE CV LAB;  Service: Cardiovascular;  Laterality: N/A;   BIV PACEMAKER INSERTION CRT-P N/A 09/27/2016   Procedure: BiV Pacemaker Insertion CRT-P;  Surgeon: Lynwood Rakers, MD;  Location: [facility] INVASIVE CV LAB;  Service: Cardiovascular;  Laterality: N/A;   CARDIAC CATHETERIZATION N/A 01/06/2016   Procedure: Right/Left Heart Cath and Coronary Angiography;  Surgeon: Victory LELON Sharps, MD;  Location: [facility] INVASIVE CV LAB;  Service: Cardiovascular;  Laterality: N/A;   CARDIOVERSION N/A 01/11/2016   Procedure: CARDIOVERSION;  Surgeon: Ezra GORMAN Shuck, MD;  Location: [facility] ENDOSCOPY;  Service:  Cardiovascular;  Laterality: N/A;   cataracts     COLONOSCOPY N/A 10/13/2015   Procedure: COLONOSCOPY;  Surgeon: Elspeth Deward Naval, MD;  Location: [facility] ENDOSCOPY;  Service: Gastroenterology;  Laterality: N/A;   CORONARY ANGIOPLASTY     Status post PTCA and stenting of the first obtuse marginal-05/13/2006. We placed a 3.0 x 20 mm Taxus stent. It was post dilated using a 3.25 mm noncompliant balloon up to 14 atmospheres   IMPLANT POCKET INCISION & DRAINAGE N/A 11/02/2016   Procedure: Implant Pocket Incision & Drainage;  Surgeon: Waddell Danelle ORN, MD;  Location: [facility] INVASIVE CV LAB;  Service: Cardiovascular;  Laterality: N/A;   PPM GENERATOR CHANGEOUT N/A 11/28/2021   Procedure: PPM GENERATOR CHANGEOUT;  Surgeon: Waddell Danelle ORN, MD;  Location: [facility] INVASIVE CV LAB;  Service: Cardiovascular;  Laterality: N/A;    TONSILLECTOMY     Current Outpatient Medications  Medication Sig Dispense Refill   ACCU-CHEK FASTCLIX LANCETS MISC USE TO CHECK BLOOD SUGAR TWICE DAILY  5   ACCU-CHEK SMARTVIEW test strip      atorvastatin  (LIPITOR) 20 MG tablet Take 1 tablet (20 mg total) by mouth daily. PLEASE SCHEDULE APPOINTMENT FOR MORE REFILLS 30 tablet 0   dapagliflozin  propanediol (FARXIGA ) 10 MG TABS tablet Take 1 tablet (10 mg total) by mouth daily before breakfast. 30 tablet 11   ELIQUIS  2.5 MG TABS tablet TAKE 1 TABLET (2.5 MG TOTAL) BY MOUTH 2 (TWO) TIMES DAILY. RESUME 6/4 WITH THE EVENING DOSE 60 tablet 11   gabapentin (NEURONTIN) 100 MG capsule Take 100 mg by mouth daily as needed.     Homeopathic Products (ARNICARE ARTHRITIS) TBDP Take 1 tablet by mouth daily as needed (mouth pain).     metFORMIN  (GLUCOPHAGE ) 500 MG tablet Take 500 mg by mouth daily.     nitroGLYCERIN  (NITROSTAT ) 0.4 MG SL tablet TAKE 1 TABLET BY MOUTH EVERY DAY AS NEEDED 25 tablet 2   OVER THE COUNTER MEDICATION Apply 1 application. topically daily as needed (arthritis). Artrosilium topical cream     OVER THE COUNTER MEDICATION Apply 1 application. topically daily as needed (leg pain). magnilife db foot cream     potassium chloride  (KLOR-CON ) 10 MEQ tablet TAKE 4 TABLETS (40 MEQ TOTAL) BY MOUTH DAILY. 360 tablet 3   spironolactone  (ALDACTONE ) 25 MG tablet TAKE 1 TABLET BY MOUTH EVERYDAY AT BEDTIME 90 tablet 2   torsemide  (DEMADEX ) 20 MG tablet TAKE 1 TABLET (20 MG TOTAL) BY MOUTH DAILY. 40 MG EVERY OTHER DAY ALTERNATING 20 MG EVERY OTHER DAY 90 tablet 4   No current facility-administered medications for this encounter.   Allergies:   Amiodarone , Ranexa  [ranolazine ], Other, Demerol, Erythromycin, Pecan nut (diagnostic), Penicillins, Percocet [oxycodone -acetaminophen ], and Tomato   Social History:  The patient  reports that she quit smoking about 60 years ago. Her smoking use included cigarettes. She has never used smokeless tobacco. She reports  that she does not drink alcohol  and does not use drugs.   Family History:  The patient's family history includes Alzheimer's disease in her sister; Coronary artery disease in her sister; Heart attack in her brother, father, mother, and sister; Stroke in her mother and sister.   ROS:  Please see the history of present illness.   All other systems are personally reviewed and negative.   Recent Labs: 10/25/2023: TSH 4.013 05/07/2024: B Natriuretic Peptide 443.9; BUN 33; Creatinine, Ser 1.58; Hemoglobin 12.3; Platelets 183; Potassium 4.7; Sodium 135  Personally reviewed   Wt Readings from Last 3 Encounters:  05/07/24 50 kg (110 lb 3.2 oz)  10/25/23 47.3 kg (104 lb 3.2 oz)  09/09/23 50 kg (110 lb 3.2 oz)   BP 114/73   Pulse 62   Ht 5' 1 (1.549 m)   Wt 50 kg (110 lb 3.2 oz)   SpO2 99%   BMI 20.82 kg/m   Physical Exam:  General:  Arrived in wheelchair. No distress. Thin, frail. Neck: no JVD Cor: Regular rate & rhythm. No murmurs. Lungs: clear Abdomen: soft, nontender, nondistended. Extremities: 1+ edema, prominent variscosities Neuro: alert & orientedx3. Affect pleasant   Assessment/Plan 1. Chronic systolic CHF: EF 79-74% on 7/17 echo, 30-35% on 1/18 echo.  Nonischemic cardiomyopathy, possible role for tachycardia-mediated cardiomyopathy (some improvement in EF with rate control but not back to normal). ECHO 03/2017 EF 40-45%.  She had AV nodal ablation due to difficult-to-control atrial fibrillation in 3/18 with St Jude CRT-P placement. Repeat echo in 9/19 showed EF up to 60-65%.  Echo in 10/20 with EF 55-60%, moderately decreased RV systolic function with moderate RV enlargement, moderate-severe biatrial enlargement, moderate-severe TR, mild MR.  Echo in 4/22 with EF 55% with mild LVH, mild RV enlargement with mildly decreased RV systolic function, severe biatrial enlargement, mild MR, moderate TR.  Echo (5/23) with EF 50-55%, moderate RV enlargement with normal systolic function, PASP 42  mmHg, moderate MR, severe TR.  Echo in 12/23 with EF 45-50%, lateral hypokinesis, severe RV dilation with mildly decreased RV systolic function, severe TR. Echo in 4/25 showed  EF 45-50%, global hypokinesis, mild RV dysfunction with mildly enlarged RV, mild MR, mild-moderate TR.  NYHA III symptoms. Frail and deconditioned. She is working on her nutrition. - Continue 20 mg Torsemide  daily, take an extra 20 mg X 2 days. Continue 40 mEq KCL daily. Check BMET/BNP today. - Continue spironolactone  25 mg at bedtime.  - Continue dapagliflozin  10 mg daily.  - Unable to tolerate losartan  at even low dose.  - Wear compression stockings during the day. 2. Atrial fibrillation/flutter:  Concern for prior tachy-mediated cardiomyopathy.  She had suspected amiodarone -induced lung toxicity so ranolazine  was started.  She did not tolerate ranolazine  due to nausea. Poor Tikosyn candidate with CKD.   She tolerated atrial fibrillation poorly and rate was difficult to control, so she had AV nodal ablation with St Jude CRT-P device.  She is in chronic AF.  - Continue apixaban  2.5 mg bid (age, creatinine).  CBC today. Denies bleeding issues. Discussed fall precautions. 3. CAD: Patent OM stent on coronary angiography in 7/17.  No chest pain.  - Continue statin, good lipids 12/24 - No ASA with apixaban  use.   4. 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  Off amiodarone .  Follows with pulmonary.  5. CKD stage 3b/4:  Baseline Scr 1.5-1.8. - Continue Farxiga  10 mg daily  Followup 3 months with APP   Loren Sawaya N, PA-C  05/07/2024

## 2024-05-07 NOTE — Patient Instructions (Signed)
 Medication Changes:  TAKE AN EXTRA 20MG  OF TORSEMIDE  FOR THE NEXT 2 DAYS   Lab Work:  Labs done today, your results will be available in MyChart, we will contact you for abnormal readings.  Follow-Up in: 3 MONTHS WITH APP AS SCHEDULED   At the [facility], you and your health needs are our priority. We have a designated team specialized in the treatment of Heart Failure. This Care Team includes your primary Heart Failure Specialized Cardiologist (physician), Advanced Practice Providers (APPs- Physician Assistants and Nurse Practitioners), and Pharmacist who all work together to provide you with the care you need, when you need it.   You may see any of the following providers on your designated Care Team at your next follow up:  Dr. Toribio Fuel Dr. Ezra Shuck Dr. Odis Brownie Greig Mosses, NP Caffie Shed, [place] [facility] [place], [place] Beckey Coe, NP Jordan Lee, NP Tinnie Redman, PharmD   Please be sure to bring in all your medications bottles to every appointment.   Need to Contact Us :  If you have any questions or concerns before your next appointment please send us  a message through [facility] or call our office at [phone].    TO LEAVE A MESSAGE FOR THE NURSE SELECT OPTION 2, PLEASE LEAVE A MESSAGE INCLUDING: YOUR NAME DATE OF BIRTH CALL BACK NUMBER REASON FOR CALL**this is important as we prioritize the call backs  YOU WILL RECEIVE A CALL BACK THE SAME DAY AS LONG AS YOU CALL BEFORE 4:00 PM

## 2024-05-08 ENCOUNTER — Ambulatory Visit (HOSPITAL_COMMUNITY): Payer: Self-pay | Admitting: Physician Assistant

## 2024-06-05 ENCOUNTER — Ambulatory Visit

## 2024-06-08 ENCOUNTER — Encounter: Payer: Self-pay | Admitting: Internal Medicine

## 2024-07-13 ENCOUNTER — Other Ambulatory Visit (HOSPITAL_COMMUNITY): Payer: Self-pay | Admitting: Cardiology

## 2024-08-03 ENCOUNTER — Ambulatory Visit: Admitting: Podiatry

## 2024-08-05 NOTE — Progress Notes (Incomplete)
 "     ID:  Shannon [name], DOB March 06, 1932, MRN [id number]  Provider location: [facility] Type of Visit: Established patient   PCP:  Tisovec, Charlie ORN, MD  Cardiologist:  Aleene Passe, MD (Inactive) HF Cardiology: Dr. Rolan  Chief complaint: CHF   History of Present Illness: Shannon [name] is a 89 y.o. female who has a history of paroxysmal atrial fibrillation, chronic systolic CHF, and CAD. She apparently had been in persistent atrial fibrillation since around 8/16.  It appears that she was tachycardic most of the time prior to her recent admission in 7/17.  She had been on amiodarone  prior to admission but it was not keeping her in NSR.  She was admitted with acute systolic CHF.  EF had dropped to 20-25% in 7/17 from 50-55% in 10/16.  She was in atrial fibrillation with RVR.  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  Amiodarone  was stopped due to suspicion for amiodarone -induced lung toxicity (she was seen by pulmonary).  She was eventually cardioverted to NSR.  Ranolazine  was started to try to keep her in NSR.  HR was low in the hospital so she has not been on a beta blocker.  She was extensively diuresed and discharged to SNF.  After discharge, she developed persistent nausea.  Eventually, it was found that the nausea was likely from ranolazine , so this medication was stopped and the nausea resolved.     Patient continued to have difficult-to-control atrial fibrillation.  GFR was too low for Tikosyn.  She therefore had AV nodal ablation with St Jude CRT-P device placed in 3/18.  She developed a pocket hematoma and had pocket revision with sub-pectoral placement in 5/18.    Echo in 9/19 showed EF up to 60-65%, moderate MR, moderate TR, PASP 43 mmHg. Echo in 10/20 showed EF 55-60%, moderately decreased RV systolic function with moderate RV enlargement, moderate-severe biatrial enlargement, moderate-severe TR, mild MR.    Echo in 4/22 showed EF 55% with mild LVH, mild RV enlargement with mildly decreased RV systolic function, severe biatrial enlargement, mild MR, moderate TR.  Echo in 5/23 showed EF 50-55%, moderate RV enlargement with normal systolic function, PASP 42 mmHg, moderate MR, severe TR.   Patient was admitted to the hospital in [facility] in 12/23 with CHF, predominantly RV failure.  Echo there showed EF 45-50%, lateral hypokinesis, severe RV dilation with mildly decreased RV systolic function, severe TR.   Echo in 4/25 showed EF 45-50%, global hypokinesis, mild RV dysfunction with mildly enlarged RV, mild MR.   Today she returns for HF follow up with her son. Weight down 6 lbs.  She has a poor appetite and gets nausea at times.  She feels weak in general, this has been worse x 3 wks.  She noticed black stool earlier in the weak but now it appears normal.  She is short of breath walking across the house.  Uses a walker.  No chest pain.  No orthopnea/PND .  ECG (personally reviewed): Atrial fibrillation with v-pacing  St Jude device interrogation: stable thoracic impedence,  99% his bundle pacing.    Labs (4/24): K 3.9, creatinine 1.66 Labs (8/24): K 4.5, creatinine 1.65 Labs (10/24): K 3.5, creatinine 1.56, BNP 399 Labs (12/24): LDL 44 Labs (1/25): K 4.6, creatinine 1.72 Labs (3/25): K 4.5, creatinine 1.64    PMH: 1. Atrial fibrillation: Paroxysmal.  Had been persistent since 8/16, was cardioverted to NSR during 7/17 admission.  Possible atypical atrial flutter on 12/17 ECG.  By 3/18, atrial fibrillation again.  - Nausea with ranolazine  used for atrial fibrillation suppression.  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  - AV nodal ablation 3/18 with CRT-P.  2. 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  She had been in atrial fibrillation with elevated rate possibly for months prior to 7/17 admission.  - Echo (10/16): EF 50-55%.  - Echo (7/17): EF  20-25%, mildly decreased RV systolic function, severe TR, PA systolic pressure 64 mmHg.  - LHC/RHC (7/17): Patent OM stent, no obstructive CAD.  Mean RA 13, PA 45/25, mean PCWP 16, CI 2.36.  - Echo (1/18): EF 30-35% with diffuse hypokinesis, mildly decreased RV systolic function, moderate TR and MR, PASP 53 mmHg, severe LAE.  - St Jude CRT-P placed with AV nodal ablation in 3/18.  - Echo (9/19): EF 60-65%, moderate LVH, grade 3 diastolic dysfunction, mild MR, mild AI, moderate TR, PASP 43 mmHg.  - Echo (10/20): EF 55-60%, moderately decreased RV systolic function with moderate RV enlargement, moderate-severe biatrial enlargement, moderate-severe TR, mild MR.  - Echo (4/22): EF 55% with mild LVH, mild RV enlargement with mildly decreased RV systolic function, severe biatrial enlargement, mild MR, moderate TR.  - Echo (5/23): EF 50-55%, moderate RV enlargement with normal systolic function, PASP 42 mmHg, moderate MR, severe TR.  - Echo (12/23): EF 45-50%, lateral hypokinesis, severe RV dilation with mildly decreased RV systolic function, severe TR.  - Echo (4/25):  EF 45-50%, global hypokinesis, mild RV dysfunction with mildly enlarged RV, mild MR.  3. CAD: DES to OM in 2007.  LHC (7/17) with patent stent and no new obstructive disease.  4. Type II diabetes 5. Hyperlipidemia 6. TIA 7. Possible amiodarone  lung toxicity: Chest CT (7/17) with bronchiectasis/fibrosis, patchy bilateral densities.  8. CKD stage 3  Past Surgical History:  Procedure Laterality Date   ATRIAL FLUTTER ABLATION N/A 10/09/2012   Procedure: ATRIAL FLUTTER ABLATION;  Surgeon: Lynwood Rakers, MD;  Location: [facility] CATH LAB;  Service: Cardiovascular;  Laterality: N/A;   AV NODE ABLATION N/A 09/27/2016   Procedure: AV Node Ablation;  Surgeon: Lynwood Rakers, MD;  Location: [facility] INVASIVE CV LAB;  Service: Cardiovascular;  Laterality: N/A;   BIV PACEMAKER INSERTION CRT-P N/A 09/27/2016   Procedure: BiV Pacemaker Insertion CRT-P;  Surgeon:  Lynwood Rakers, MD;  Location: [facility] INVASIVE CV LAB;  Service: Cardiovascular;  Laterality: N/A;   CARDIAC CATHETERIZATION N/A 01/06/2016   Procedure: Right/Left Heart Cath and Coronary Angiography;  Surgeon: Victory LELON Sharps, MD;  Location: [facility] INVASIVE CV LAB;  Service: Cardiovascular;  Laterality: N/A;   CARDIOVERSION N/A 01/11/2016   Procedure: CARDIOVERSION;  Surgeon: Ezra GORMAN Shuck, MD;  Location: [facility] ENDOSCOPY;  Service: Cardiovascular;  Laterality: N/A;   cataracts     COLONOSCOPY N/A 10/13/2015   Procedure: COLONOSCOPY;  Surgeon: Elspeth Deward Naval, MD;  Location: [facility] ENDOSCOPY;  Service: Gastroenterology;  Laterality: N/A;   CORONARY ANGIOPLASTY     Status post PTCA and stenting of the first obtuse marginal-05/13/2006. We placed a 3.0 x 20 mm Taxus stent. It was post dilated using a 3.25 mm noncompliant balloon up to 14 atmospheres   IMPLANT POCKET INCISION & DRAINAGE N/A 11/02/2016   Procedure: Implant Pocket Incision & Drainage;  Surgeon: Waddell Danelle LELON, MD;  Location: [facility] INVASIVE CV LAB;  Service: Cardiovascular;  Laterality: N/A;   PPM GENERATOR CHANGEOUT N/A 11/28/2021   Procedure: PPM GENERATOR CHANGEOUT;  Surgeon: Waddell Danelle LELON, MD;  Location:  [facility] INVASIVE CV LAB;  Service: Cardiovascular;  Laterality: N/A;   TONSILLECTOMY     Current Outpatient Medications  Medication Sig Dispense Refill   ACCU-CHEK FASTCLIX LANCETS MISC USE TO CHECK BLOOD SUGAR TWICE DAILY  5   ACCU-CHEK SMARTVIEW test strip      atorvastatin  (LIPITOR) 20 MG tablet Take 1 tablet (20 mg total) by mouth daily. 90 tablet 3   dapagliflozin  propanediol (FARXIGA ) 10 MG TABS tablet Take 1 tablet (10 mg total) by mouth daily before breakfast. 30 tablet 11   ELIQUIS  2.5 MG TABS tablet TAKE 1 TABLET (2.5 MG TOTAL) BY MOUTH 2 (TWO) TIMES DAILY. RESUME 6/4 WITH THE EVENING DOSE 60 tablet 11   gabapentin (NEURONTIN) 100 MG capsule Take 100 mg by mouth daily as needed.     Homeopathic Products (ARNICARE ARTHRITIS) TBDP Take 1  tablet by mouth daily as needed (mouth pain).     metFORMIN  (GLUCOPHAGE ) 500 MG tablet Take 500 mg by mouth daily.     nitroGLYCERIN  (NITROSTAT ) 0.4 MG SL tablet TAKE 1 TABLET BY MOUTH EVERY DAY AS NEEDED 25 tablet 2   OVER THE COUNTER MEDICATION Apply 1 application. topically daily as needed (arthritis). Artrosilium topical cream     OVER THE COUNTER MEDICATION Apply 1 application. topically daily as needed (leg pain). magnilife db foot cream     potassium chloride  (KLOR-CON ) 10 MEQ tablet TAKE 4 TABLETS (40 MEQ TOTAL) BY MOUTH DAILY. 360 tablet 3   spironolactone  (ALDACTONE ) 25 MG tablet TAKE 1 TABLET BY MOUTH EVERYDAY AT BEDTIME 90 tablet 2   torsemide  (DEMADEX ) 20 MG tablet TAKE 1 TABLET (20 MG TOTAL) BY MOUTH DAILY. 40 MG EVERY OTHER DAY ALTERNATING 20 MG EVERY OTHER DAY 90 tablet 4   No current facility-administered medications for this visit.   Allergies:   Amiodarone , Ranexa  [ranolazine ], Other, Demerol, Erythromycin, Pecan nut (diagnostic), Penicillins, Percocet [oxycodone -acetaminophen ], and Tomato   Social History:  The patient  reports that she quit smoking about 61 years ago. Her smoking use included cigarettes. She smoked an average of 1 pack per day. She has never used smokeless tobacco. She reports that she does not drink alcohol  and does not use drugs.   Family History:  The patient's family history includes Alzheimer's disease in her sister; Coronary artery disease in her sister; Heart attack in her brother, father, mother, and sister; Stroke in her mother and sister.   ROS:  Please see the history of present illness.   All other systems are personally reviewed and negative.   Recent Labs: 10/25/2023: TSH 4.013 05/07/2024: B Natriuretic Peptide 443.9; BUN 33; Creatinine, Ser 1.58; Hemoglobin 12.3; Platelets 183; Potassium 4.7; Sodium 135  Personally reviewed   Wt Readings from Last 3 Encounters:  05/07/24 50 kg (110 lb 3.2 oz)  10/25/23 47.3 kg (104 lb 3.2 oz)  09/09/23 50  kg (110 lb 3.2 oz)   There were no vitals taken for this visit. General: NAD, thin/frail Neck: No JVD, no thyromegaly or thyroid  nodule.  Lungs: Clear to auscultation bilaterally with normal respiratory effort. CV: Nondisplaced PMI.  Heart regular S1/S2, no S3/S4, no murmur.  No peripheral edema.  No carotid bruit.  Normal pedal pulses.  Abdomen: Soft, nontender, no hepatosplenomegaly, no distention.  Skin: Intact without lesions or rashes.  Neurologic: Alert and oriented x 3.  Psych: Normal affect. Extremities: No clubbing or cyanosis.  HEENT: Normal.   Assessment/Plan 1. Chronic systolic CHF: EF 79-74% on 7/17 echo, 30-35% on 1/18 echo.  Nonischemic cardiomyopathy, possible role for tachycardia-mediated cardiomyopathy (some improvement in EF with rate control but not back to normal). ECHO 03/2017 EF 40-45%.  She had AV nodal ablation due to difficult-to-control atrial fibrillation in 3/18 with St Jude CRT-P placement. Repeat echo in 9/19 showed EF up to 60-65%.  Echo in 10/20 with EF 55-60%, moderately decreased RV systolic function with moderate RV enlargement, moderate-severe biatrial enlargement, moderate-severe TR, mild MR.  Echo in 4/22 with EF 55% with mild LVH, mild RV enlargement with mildly decreased RV systolic function, severe biatrial enlargement, mild MR, moderate TR.  Echo (5/23) with EF 50-55%, moderate RV enlargement with normal systolic function, PASP 42 mmHg, moderate MR, severe TR.  Echo in 12/23 with EF 45-50%, lateral hypokinesis, severe RV dilation with mildly decreased RV systolic function, severe TR. Echo in 4/25 showed  EF 45-50%, global hypokinesis, mild RV dysfunction with mildly enlarged RV, mild MR, mild-moderate TR.  NYHA class III symptoms with weight loss and nausea.  She does not appear volume overloaded by exam or [place].  She is very frail and seems deconditioned.   - Increase dietary protein. - Continue spironolactone  25 mg at bedtime.  BMET/BNP today. -  Continue torsemide  20 mg daily + 40 KCL daily. - Continue dapagliflozin  10 mg daily.  - Unable to tolerate losartan  at even low dose.  - Continue compression stockings during the day.  2. Atrial fibrillation/flutter:  Concern for prior tachy-mediated cardiomyopathy.  She had suspected amiodarone -induced lung toxicity so ranolazine  was started.  She did not tolerate ranolazine  due to nausea. Poor Tikosyn candidate with CKD.   She tolerated atrial fibrillation poorly and rate was difficult to control, so she had AV nodal ablation with St Jude CRT-P device.  She is in chronic AF.  - Continue apixaban  2.5 mg bid (age, creatinine). CBC today.  3. CAD: Patent OM stent on coronary angiography in 7/17.  No chest pain.  - Continue statin, good lipids 12/24 - No ASA with apixaban  use.   4. 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  Off amiodarone .  Follows with pulmonary.  5. CKD stage 3:  Baseline SCr 1.5-1.8 - Continue Farxiga  10 mg daily.  Followup APP in 6 wks  I spent 31 minutes reviewing records, interviewing/examining patient, and managing orders.   Signed, Harlene CHRISTELLA Gainer, FNP  08/05/2024  [facility] [address] [facility] [place] [address] [phone] (office) [phone] (fax) "

## 2024-08-06 ENCOUNTER — Telehealth (HOSPITAL_COMMUNITY): Payer: Self-pay

## 2024-08-06 NOTE — Telephone Encounter (Signed)
 Called to confirm/remind patient of their appointment at [facility] on 08/07/24.   Appointment:   [] Confirmed  [] Left mess   [x] No answer/No voice mail  [] VM Full/unable to leave message  [] Phone not in service

## 2024-08-07 ENCOUNTER — Ambulatory Visit (HOSPITAL_COMMUNITY)

## 2024-08-14 ENCOUNTER — Ambulatory Visit (HOSPITAL_COMMUNITY)

## 2024-09-04 ENCOUNTER — Ambulatory Visit

## 2024-12-05 ENCOUNTER — Ambulatory Visit
